# Patient Record
Sex: Female | Born: 1950 | Race: White | Hispanic: No | Marital: Married | Smoking: Never smoker
Health system: Southern US, Community
[De-identification: ages and names within clinical notes are randomized; demographics above are authoritative.]

## PROBLEM LIST (undated history)

## (undated) DIAGNOSIS — R112 Nausea with vomiting, unspecified: Secondary | ICD-10-CM

## (undated) DIAGNOSIS — M069 Rheumatoid arthritis, unspecified: Secondary | ICD-10-CM

## (undated) DIAGNOSIS — I779 Disorder of arteries and arterioles, unspecified: Secondary | ICD-10-CM

## (undated) DIAGNOSIS — I2510 Atherosclerotic heart disease of native coronary artery without angina pectoris: Secondary | ICD-10-CM

## (undated) DIAGNOSIS — F439 Reaction to severe stress, unspecified: Secondary | ICD-10-CM

## (undated) DIAGNOSIS — Z9889 Other specified postprocedural states: Secondary | ICD-10-CM

## (undated) DIAGNOSIS — E663 Overweight: Secondary | ICD-10-CM

## (undated) DIAGNOSIS — M75101 Unspecified rotator cuff tear or rupture of right shoulder, not specified as traumatic: Secondary | ICD-10-CM

## (undated) DIAGNOSIS — I739 Peripheral vascular disease, unspecified: Secondary | ICD-10-CM

## (undated) DIAGNOSIS — E785 Hyperlipidemia, unspecified: Secondary | ICD-10-CM

## (undated) DIAGNOSIS — I5032 Chronic diastolic (congestive) heart failure: Secondary | ICD-10-CM

## (undated) DIAGNOSIS — R609 Edema, unspecified: Secondary | ICD-10-CM

## (undated) DIAGNOSIS — I10 Essential (primary) hypertension: Secondary | ICD-10-CM

## (undated) DIAGNOSIS — I639 Cerebral infarction, unspecified: Secondary | ICD-10-CM

## (undated) DIAGNOSIS — B3781 Candidal esophagitis: Secondary | ICD-10-CM

## (undated) DIAGNOSIS — G709 Myoneural disorder, unspecified: Secondary | ICD-10-CM

## (undated) DIAGNOSIS — M79606 Pain in leg, unspecified: Secondary | ICD-10-CM

## (undated) HISTORY — DX: Reaction to severe stress, unspecified: F43.9

## (undated) HISTORY — PX: TUBAL LIGATION: SHX77

## (undated) HISTORY — DX: Hyperlipidemia, unspecified: E78.5

## (undated) HISTORY — DX: Essential (primary) hypertension: I10

## (undated) HISTORY — DX: Disorder of arteries and arterioles, unspecified: I77.9

## (undated) HISTORY — DX: Candidal esophagitis: B37.81

## (undated) HISTORY — DX: Edema, unspecified: R60.9

## (undated) HISTORY — DX: Atherosclerotic heart disease of native coronary artery without angina pectoris: I25.10

## (undated) HISTORY — DX: Overweight: E66.3

## (undated) HISTORY — DX: Pain in leg, unspecified: M79.606

## (undated) HISTORY — PX: CHOLECYSTECTOMY: SHX55

## (undated) HISTORY — PX: CORONARY ANGIOPLASTY: SHX604

## (undated) HISTORY — DX: Cerebral infarction, unspecified: I63.9

## (undated) HISTORY — DX: Chronic diastolic (congestive) heart failure: I50.32

## (undated) HISTORY — DX: Rheumatoid arthritis, unspecified: M06.9

## (undated) HISTORY — DX: Peripheral vascular disease, unspecified: I73.9

## (undated) HISTORY — PX: CARDIAC CATHETERIZATION: SHX172

## (undated) HISTORY — PX: OTHER SURGICAL HISTORY: SHX169

---

## 2000-04-04 ENCOUNTER — Inpatient Hospital Stay (HOSPITAL_COMMUNITY): Admission: EM | Admit: 2000-04-04 | Discharge: 2000-04-08 | Payer: Self-pay | Admitting: Cardiology

## 2000-12-20 ENCOUNTER — Ambulatory Visit (HOSPITAL_COMMUNITY): Admission: RE | Admit: 2000-12-20 | Discharge: 2000-12-21 | Payer: Self-pay | Admitting: Cardiology

## 2003-01-13 ENCOUNTER — Emergency Department (HOSPITAL_COMMUNITY): Admission: EM | Admit: 2003-01-13 | Discharge: 2003-01-13 | Payer: Self-pay | Admitting: Internal Medicine

## 2003-01-13 ENCOUNTER — Encounter: Payer: Self-pay | Admitting: Internal Medicine

## 2003-04-03 ENCOUNTER — Encounter: Payer: Self-pay | Admitting: Family Medicine

## 2003-04-03 ENCOUNTER — Ambulatory Visit (HOSPITAL_COMMUNITY): Admission: RE | Admit: 2003-04-03 | Discharge: 2003-04-03 | Payer: Self-pay | Admitting: Family Medicine

## 2003-11-29 ENCOUNTER — Ambulatory Visit (HOSPITAL_COMMUNITY): Admission: RE | Admit: 2003-11-29 | Discharge: 2003-11-29 | Payer: Self-pay | Admitting: General Surgery

## 2004-04-29 ENCOUNTER — Ambulatory Visit (HOSPITAL_COMMUNITY): Admission: RE | Admit: 2004-04-29 | Discharge: 2004-04-29 | Payer: Self-pay | Admitting: Family Medicine

## 2004-07-15 ENCOUNTER — Ambulatory Visit (HOSPITAL_COMMUNITY): Admission: RE | Admit: 2004-07-15 | Discharge: 2004-07-15 | Payer: Self-pay | Admitting: Podiatry

## 2004-10-19 ENCOUNTER — Ambulatory Visit: Payer: Self-pay | Admitting: Gastroenterology

## 2004-10-28 ENCOUNTER — Ambulatory Visit: Payer: Self-pay | Admitting: Gastroenterology

## 2004-10-30 ENCOUNTER — Ambulatory Visit: Payer: Self-pay | Admitting: Gastroenterology

## 2004-12-22 ENCOUNTER — Ambulatory Visit: Payer: Self-pay | Admitting: Cardiology

## 2005-01-13 ENCOUNTER — Ambulatory Visit: Payer: Self-pay

## 2005-01-20 ENCOUNTER — Ambulatory Visit: Payer: Self-pay | Admitting: Cardiology

## 2005-08-17 ENCOUNTER — Ambulatory Visit: Payer: Self-pay | Admitting: Gastroenterology

## 2005-10-18 ENCOUNTER — Ambulatory Visit (HOSPITAL_COMMUNITY): Admission: RE | Admit: 2005-10-18 | Discharge: 2005-10-18 | Payer: Self-pay | Admitting: *Deleted

## 2005-10-20 ENCOUNTER — Encounter: Admission: RE | Admit: 2005-10-20 | Discharge: 2005-12-10 | Payer: Self-pay | Admitting: *Deleted

## 2005-11-03 ENCOUNTER — Ambulatory Visit (HOSPITAL_COMMUNITY): Admission: RE | Admit: 2005-11-03 | Discharge: 2005-11-03 | Payer: Self-pay | Admitting: *Deleted

## 2006-07-19 ENCOUNTER — Ambulatory Visit: Payer: Self-pay

## 2006-07-19 ENCOUNTER — Ambulatory Visit: Payer: Self-pay | Admitting: Cardiology

## 2006-11-08 ENCOUNTER — Encounter: Admission: RE | Admit: 2006-11-08 | Discharge: 2007-02-06 | Payer: Self-pay | Admitting: *Deleted

## 2006-12-28 ENCOUNTER — Emergency Department (HOSPITAL_COMMUNITY): Admission: EM | Admit: 2006-12-28 | Discharge: 2006-12-28 | Payer: Self-pay | Admitting: Emergency Medicine

## 2007-02-28 ENCOUNTER — Ambulatory Visit (HOSPITAL_COMMUNITY): Admission: RE | Admit: 2007-02-28 | Discharge: 2007-03-01 | Payer: Self-pay | Admitting: *Deleted

## 2007-03-15 ENCOUNTER — Encounter: Admission: RE | Admit: 2007-03-15 | Discharge: 2007-06-13 | Payer: Self-pay | Admitting: *Deleted

## 2007-07-23 ENCOUNTER — Emergency Department (HOSPITAL_COMMUNITY): Admission: EM | Admit: 2007-07-23 | Discharge: 2007-07-23 | Payer: Self-pay | Admitting: Emergency Medicine

## 2007-08-15 ENCOUNTER — Encounter: Admission: RE | Admit: 2007-08-15 | Discharge: 2007-08-15 | Payer: Self-pay | Admitting: *Deleted

## 2008-01-10 ENCOUNTER — Ambulatory Visit: Payer: Self-pay

## 2008-01-11 ENCOUNTER — Ambulatory Visit: Payer: Self-pay | Admitting: Cardiology

## 2008-01-11 LAB — CONVERTED CEMR LAB
ALT: 18 units/L (ref 0–35)
AST: 13 units/L (ref 0–37)
Albumin: 3.3 g/dL — ABNORMAL LOW (ref 3.5–5.2)
Alkaline Phosphatase: 90 units/L (ref 39–117)
Bilirubin, Direct: 0.1 mg/dL (ref 0.0–0.3)
Cholesterol: 183 mg/dL (ref 0–200)
HDL: 43.9 mg/dL (ref >39.0)
LDL Cholesterol: 108 mg/dL — ABNORMAL HIGH (ref 0–99)
Total Bilirubin: 0.7 mg/dL (ref 0.3–1.2)
Total CHOL/HDL Ratio: 4.2
Total Protein: 6.8 g/dL (ref 6.0–8.3)
Triglycerides: 155 mg/dL — ABNORMAL HIGH (ref 0–149)
VLDL: 31 mg/dL (ref 0–40)

## 2008-11-04 DIAGNOSIS — E663 Overweight: Secondary | ICD-10-CM

## 2008-11-04 DIAGNOSIS — R609 Edema, unspecified: Secondary | ICD-10-CM | POA: Insufficient documentation

## 2008-11-11 ENCOUNTER — Telehealth: Payer: Self-pay | Admitting: Cardiology

## 2009-05-14 ENCOUNTER — Encounter: Admission: RE | Admit: 2009-05-14 | Discharge: 2009-05-14 | Payer: Self-pay | Admitting: Family Medicine

## 2009-08-27 ENCOUNTER — Telehealth: Payer: Self-pay | Admitting: Cardiology

## 2009-10-14 ENCOUNTER — Ambulatory Visit (HOSPITAL_COMMUNITY): Admission: RE | Admit: 2009-10-14 | Discharge: 2009-10-14 | Payer: Self-pay | Admitting: Family Medicine

## 2009-10-21 ENCOUNTER — Encounter (INDEPENDENT_AMBULATORY_CARE_PROVIDER_SITE_OTHER): Payer: Self-pay | Admitting: *Deleted

## 2009-11-04 ENCOUNTER — Telehealth: Payer: Self-pay | Admitting: Cardiology

## 2009-11-17 ENCOUNTER — Telehealth (INDEPENDENT_AMBULATORY_CARE_PROVIDER_SITE_OTHER): Payer: Self-pay | Admitting: *Deleted

## 2009-11-18 ENCOUNTER — Ambulatory Visit: Payer: Self-pay

## 2009-11-18 ENCOUNTER — Ambulatory Visit: Payer: Self-pay | Admitting: Cardiology

## 2009-11-18 ENCOUNTER — Encounter (HOSPITAL_COMMUNITY): Admission: RE | Admit: 2009-11-18 | Discharge: 2010-01-09 | Payer: Self-pay | Admitting: Cardiology

## 2009-11-28 LAB — CONVERTED CEMR LAB
ALT: 26 units/L (ref 0–35)
AST: 15 units/L (ref 0–37)
Albumin: 3.6 g/dL (ref 3.5–5.2)
Alkaline Phosphatase: 99 units/L (ref 39–117)
Bilirubin, Direct: 0.2 mg/dL (ref 0.0–0.3)
Cholesterol: 166 mg/dL (ref 0–200)
HDL: 49 mg/dL (ref >39.00)
LDL Cholesterol: 85 mg/dL (ref 0–99)
Total Bilirubin: 0.5 mg/dL (ref 0.3–1.2)
Total CHOL/HDL Ratio: 3
Total Protein: 6.7 g/dL (ref 6.0–8.3)
Triglycerides: 158 mg/dL — ABNORMAL HIGH (ref 0.0–149.0)
VLDL: 31.6 mg/dL (ref 0.0–40.0)

## 2009-12-03 ENCOUNTER — Encounter: Payer: Self-pay | Admitting: Cardiology

## 2009-12-04 ENCOUNTER — Ambulatory Visit: Payer: Self-pay | Admitting: Cardiology

## 2010-03-03 ENCOUNTER — Ambulatory Visit: Payer: Self-pay | Admitting: Cardiology

## 2010-03-17 ENCOUNTER — Encounter: Payer: Self-pay | Admitting: Cardiology

## 2010-03-17 ENCOUNTER — Ambulatory Visit: Payer: Self-pay

## 2010-03-19 ENCOUNTER — Telehealth: Payer: Self-pay | Admitting: Cardiology

## 2010-08-03 ENCOUNTER — Encounter: Payer: Self-pay | Admitting: Family Medicine

## 2010-08-11 NOTE — Assessment & Plan Note (Signed)
Summary: 3 month rov/sl   Visit Type:  Follow-up  CC:  CAD.  History of Present Illness: [place] is seen for followup of coronary artery disease.  I saw her last Dec 04, 2009.  She has known coronary disease.  She had a nuclear stress study Nov 18, 2009.  She did have EKG changes.  Ejection fraction was normal.  There was no definite ischemia.  She continued to have some mild chest discomfort and plans were made for her to be seen back today.  Today she mentions that she does have rare heaviness in her chest.  She does admit that she has significant emotional stresses.  She also mentions that at sometime in the past she thinks she may have had a mini stroke.  She describes change in her taste and smell.  There may have been some difficulty with movement on the right side.  This resolved.  Current Medications (verified): 1)  Furosemide 40 Mg Tabs (Furosemide) .... Take 1 Tablet By Mouth Once A Day 2)  Lipitor 40 Mg Tabs (Atorvastatin Calcium) .... Take 1 Tablet By Mouth Every Night 3)  Ramipril 2.5 Mg Caps (Ramipril) .... Take One Capsule By Mouth Daily 4)  Nitrostat 0.4 Mg Subl (Nitroglycerin) .... Place 1 Tablet Under Tongue As Directed 5)  Plavix 75 Mg Tabs (Clopidogrel Bisulfate) .... Take 1 Tablet By Mouth Once A Day 6)  Aspir-Low 81 Mg Tbec (Aspirin) .... Take One Daily 7)  Fish Oil 1000 Mg Caps (Omega-3 Fatty Acids) .... Take One Daily  Allergies (verified): 1)  Codeine Phosphate (Codeine Phosphate) 2)  Amoxicillin (Amoxicillin)  Past History:  Past Medical History: CAD  PTCA LAD..2001  /   cath  2002..prox and mid LAD stents patent...PTCA ostium Dx 1 / nuclear..01/2008...resting ST's become worse...no scar or ischemia /  nuclear.. Nov 18, 2009... ST changes with walking.. no chest pain... 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 Question carotid bruit   02/2010  Emotional stress    02/2010    Review of Systems       Patient denies fever, chills, headache, sweats, rash, change in vision, change in hearing, cough, nausea vomiting, urinary symptoms.  All other systems are reviewed and are negative other than the history of present illness.  Vital Signs:  Patient profile:   60 year old female Height:      63 inches Weight:      228 pounds BMI:     40.53 Pulse rate:   75 / minute BP sitting:   120 / 76  (left arm) Cuff size:   large  Vitals Entered By: Hardin Negus, RMA (March 03, 2010 8:54 AM)  Physical Exam  General:  Patient is overweight but stable. Head:  head is atraumatic. Eyes:  no xanthelasma. Neck:  no jugular venous distention.  Question of soft left carotid bruit. Chest Wall:  no chest wall tenderness. Lungs:  lungs are clear.  Respiratory effort is nonlabored. Heart:  cardiac exam reveals S1-S2.  No clicks or significant murmurs. Abdomen:  abdomen is soft. Msk:  no musculoskeletal deformities. Extremities:  no peripheral edema. Skin:  no skin rashes. Psych:  patient is oriented to person time and place.  Affect is normal.   Impression & Recommendations:  Problem # 1:  * EMOTIONAL STRESS The patient has been under significant emotional stress.  I do believe  this is playing a role in how she feels overall.  Follow up with a primary physician would be helpful.  Problem # 2:  * CAROTID BRUIT Patient gives a history of a possible mini stroke in the past.  She was not actually evaluated for this.  There are no significant residual symptoms.  With this history and a very soft left carotid bruit and known vascular disease, I have arranged for a carotid Doppler to be done.  I will be in touch with her with the information.  Problem # 3:  * MINI STROKE IN THE PAST  ?? We have no documentation of this.  This is the first time the patient has mentioned this to me.  She is on appropriate  medications.  Carotid Dopplers to be done.  Problem # 4:  CAD, UNSPECIFIED SITE (ICD-414.00)  Her updated medication list for this problem includes:    Ramipril 2.5 Mg Caps (Ramipril) .Marland Kitchen... Take one capsule by mouth daily    Nitrostat 0.4 Mg Subl (Nitroglycerin) .Marland Kitchen... Place 1 tablet under tongue as directed    Plavix 75 Mg Tabs (Clopidogrel bisulfate) .Marland Kitchen... Take 1 tablet by mouth once a day    Aspir-low 81 Mg Tbec (Aspirin) .Marland Kitchen... Take one daily The patient's coronary disease seems to be stable.  She does have some chest pressure.  She underwent a nuclear exercise test May, 2011 and was low risk.  I've chosen today not to change her meds.  I considered whether cardiac catheterization should be done and I feel that it is not indicated at this time.  I will see her for followup.  Problem # 5:  HYPERTENSION, UNSPECIFIED (ICD-401.9)  Her updated medication list for this problem includes:    Furosemide 40 Mg Tabs (Furosemide) .Marland Kitchen... Take 1 tablet by mouth once a day    Ramipril 2.5 Mg Caps (Ramipril) .Marland Kitchen... Take one capsule by mouth daily    Aspir-low 81 Mg Tbec (Aspirin) .Marland Kitchen... Take one daily Blood pressure is under good control today.  No change in therapy.  Problem # 6:  OVERWEIGHT/OBESITY (ICD-278.02) Unfortunately the patient is getting a great deal of her weight back.  Other Orders: Carotid Duplex (Carotid Duplex)  Patient Instructions: 1)  Your physician has requested that you have a carotid duplex. This test is an ultrasound of the carotid arteries in your neck. It looks at blood flow through these arteries that supply the brain with blood. Allow one hour for this exam. There are no restrictions or special instructions. 2)  Your physician wants you to follow-up in:  6 months.  You will receive a reminder letter in the mail two months in advance. If you don't receive a letter, please call our office to schedule the follow-up appointment.

## 2010-08-11 NOTE — Assessment & Plan Note (Signed)
Summary: need f/u after myoview/sl   Visit Type:  two-year followup  CC:  CAD.  History of Present Illness: The patient is seen for followup of coronary artery disease.  I saw her last in July, 2009.  She reported to [place] that she had some mild symptoms and we arranged for a nuclear exercise study done Nov 18, 2009.  The study revealed that she walked for 6 minutes.  There was no chest pain.  There were some ST changes.  There was some anterior breast attenuation but no ischemia.  Ejection fraction was 79%.  This was a low risk scan.  She states that she feels that her overall exercise tolerance is somewhat decreased from the past.  Her original symptoms of ischemia were unusual.  She may have had a choking sensation and also some discomfort in her right arm.  She has not had this type of symptom.  Current Medications (verified): 1)  Furosemide 40 Mg Tabs (Furosemide) .... Take 1 Tablet By Mouth Once A Day 2)  Lipitor 40 Mg Tabs (Atorvastatin Calcium) .... Take 1 Tablet By Mouth Every Night 3)  Metoprolol Tartrate 50 Mg Tabs (Metoprolol Tartrate) .... Take 1 Tablet By Mouth Once A Day 4)  Nitrostat 0.4 Mg Subl (Nitroglycerin) .... Place 1 Tablet Under Tongue As Directed 5)  Plavix 75 Mg Tabs (Clopidogrel Bisulfate) .... Take 1 Tablet By Mouth Once A Day 6)  Aspir-Low 81 Mg Tbec (Aspirin) .... Take One Daily 7)  Fish Oil 1000 Mg Caps (Omega-3 Fatty Acids) .... Take One Daily  Allergies: 1)  Codeine Phosphate (Codeine Phosphate) 2)  Amoxicillin (Amoxicillin)  Past History:  Past Medical History: CAD  PTCA LAD..2001  /   cath  2002..prox and mid LAD stents patent...PTCA ostium Dx 1 / nuclear..01/2008...resting ST's become worse...no scar or ischemia /  nuclear.. Nov 18, 2009... ST changes with walking.. no chest pain... anterior breast attenuation with no ischemia HYPERTENSION OVERWEIGHT   laparoscopic adjustable gastric banding with APS standard system EDEMA HYPERLIPIDEMIA    Review of  Systems       Patient denies fever, chills, headache, sweats, rash, change in vision, change in hearing, chest pain, cough, nausea vomiting, urinary symptoms.  All other systems are reviewed and are negative.  Vital Signs:  Patient profile:   60 year old female Height:      63 inches Weight:      226 pounds Pulse rate:   72 / minute Pulse rhythm:   regular BP sitting:   126 / 74  (left arm)  Vitals Entered By: Jacquelin Hawking, CMA (Dec 04, 2009 8:38 AM)  Physical Exam  General:  patient is overweight but stable. Head:  head is atraumatic. Eyes:  no xanthelasma. Neck:  no jugular venous stent in.  No carotid bruits. Chest Wall:  no chest wall tenderness. Lungs:  lungs are clear respiratory effort is not labored. Heart:  cardiac exam reveals S1-S2.  No clicks or significant murmurs. Abdomen:  abdomen is soft.  No masses or bruits. Msk:  no musculoskeletal deformities. Extremities:  no peripheral edema. Skin:  no skin rashes. Psych:  patient is oriented to person time and place.  Affect is normal.   Impression & Recommendations:  Problem # 1:  CAD, UNSPECIFIED SITE (ICD-414.00) The patient had a stress nuclear study Nov 18, 2009.  She exercised for 6-1/2 minutes with no chest pain.  She had anterior breast attenuation but no scar or ischemia.  She appears to be stable  at this point.  The patient mentioned that she has a family member who had a normal exercise test and then had a large MI several weeks later.  I explained to her at length the concept of having a ruptured plaque.  She and I talked about what information her exercise test does and does not tell us.  She appears to understand.  She mentions that she has some fatigue in general.  She wonders if this this is not progression of her coronary disease.  At this point I am not convinced that it is.  Her original symptoms were unusual but not the same as she has felt recently.  I will plan to see her back in 3 months to reassess  her status.  If she has any unexplained worsening symptoms more aggressive evaluation be done.  I have reviewed completely reported for nuclear exercise test.  I also reviewed her records from 2009.  Problem # 2:  HYPERTENSION, UNSPECIFIED (ICD-401.9)  Her updated medication list for this problem includes:    Furosemide 40 Mg Tabs (Furosemide) .Marland Kitchen... Take 1 tablet by mouth once a day    Metoprolol Tartrate 50 Mg Tabs (Metoprolol tartrate) .Marland Kitchen... Take 1 tablet by mouth once a day    Aspir-low 81 Mg Tbec (Aspirin) .Marland Kitchen... Take one daily Blood pressure is controlled today.  No change in therapy.  Problem # 3:  EDEMA (ICD-782.3) She is not having any difficulties with recurrent edema.  No change in therapy.  Problem # 4:  HYPERLIPIDEMIA-MIXED (ICD-272.4)  Her updated medication list for this problem includes:    Lipitor 40 Mg Tabs (Atorvastatin calcium) .Marland Kitchen... Take 1 tablet by mouth every night The patient's recent labs are good.  Her LDL is 84 and HDL 49.  There is mild triglyceride elevation.  I discussed better diet with her.  No change in therapy at this time.  Patient Instructions: 1)  Follow up in 3 months Prescriptions: PLAVIX 75 MG TABS (CLOPIDOGREL BISULFATE) Take 1 tablet by mouth once a day  #90 x 3   Entered by:   Meredith Staggers, RN   Authorized by:   Talitha Givens, MD, [facility]   Signed by:   Meredith Staggers, RN on 12/04/2009   Method used:   Electronically to        [facility] (retail)       [address]       [place], [place]  [address]       Ph: [phone]       Fax: [phone]   RxID:   [id number] NITROSTAT 0.4 MG SUBL (NITROGLYCERIN) Place 1 tablet under tongue as directed  #25 x 6   Entered by:   Meredith Staggers, RN   Authorized by:   Talitha Givens, MD, [facility]   Signed by:   Meredith Staggers, RN on 12/04/2009   Method used:   Electronically to        [facility] [address] (retail)       [address]       [place], [place]  [address]       Ph: [phone]       Fax: [phone]   RxID:   [id number] METOPROLOL TARTRATE 50 MG TABS (METOPROLOL TARTRATE) Take 1 tablet by mouth once a day  #90 Tablet x 3   Entered by:   Meredith Staggers,  RN   Authorized by:   Talitha Givens, MD, [facility]   Signed by:   Meredith Staggers, RN on 12/04/2009   Method used:   Electronically to        [facility] [address] (retail)       [address]       [place], [place]  [address]       Ph: [phone]       Fax: [phone]   RxID:   [id number] LIPITOR 40 MG TABS (ATORVASTATIN CALCIUM) Take 1 tablet by mouth every night  #90 x 3   Entered by:   Meredith Staggers, RN   Authorized by:   Talitha Givens, MD, [facility]   Signed by:   Meredith Staggers, RN on 12/04/2009   Method used:   Electronically to        [facility] (retail)       [address]       [place], [place]  [address]       Ph: [phone]       Fax: [phone]   RxID:   [id number] FUROSEMIDE 40 MG TABS (FUROSEMIDE) Take 1 tablet by mouth once a day  #90 x 3   Entered by:   Meredith Staggers, RN   Authorized by:   Talitha Givens, MD, [facility]   Signed by:   Meredith Staggers, RN on 12/04/2009   Method used:   Electronically to        [facility] [address] (retail)       [address]       [place], [place]  [address]       Ph: [phone]       Fax: [phone]   RxID:   [id number]

## 2010-08-11 NOTE — Progress Notes (Signed)
Summary: Test results  Phone Note Call from Patient Call back at Home Phone [phone] Call back at [phone]   Caller: Patient Reason for Call: Lab or Test Results Initial call taken by: Judie Grieve,  March 19, 2010 3:47 PM  Follow-up for Phone Call        carotids on 9/6 not in system yet, called pt and left mess not available now will call her as soon get results Meredith Staggers, RN  March 19, 2010 4:34 PM   pt given results Meredith Staggers, RN  March 23, 2010 2:25 PM

## 2010-08-11 NOTE — Cardiovascular Report (Signed)
Summary: Outpatient Coinsurance Notice   Outpatient Coinsurance Notice   Imported By: Roderic Ovens 11/24/2009 12:23:44  _____________________________________________________________________  External Attachment:    Type:   Image     Comment:   External Document

## 2010-08-11 NOTE — Letter (Signed)
Summary: Colonoscopy Letter  [facility]  [address] [place], [place] [address]   Phone: [phone]  Fax: [phone]      October 21, 2009 MRN: [id number]   [facility] [address] [place], [place]  [address]   Dear Ms. SHEK,   According to your medical record, it is time for you to schedule a Colonoscopy. The American Cancer Society recommends this procedure as a method to detect early colon cancer. Patients with a family history of colon cancer, or a personal history of colon polyps or inflammatory bowel disease are at increased risk.  This letter has beeen generated based on the recommendations made at the time of your procedure. If you feel that in your particular situation this may no longer apply, please contact our office.  Please call our office at [phone] to schedule this appointment or to update your records at your earliest convenience.  Thank you for cooperating with [place] to provide you with the very best care possible.   Sincerely,  Judie Petit T. Russella Dar, M.D.  [facility] [phone]

## 2010-08-11 NOTE — Miscellaneous (Signed)
  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

## 2010-08-11 NOTE — Progress Notes (Signed)
Summary: Nuclear Pre-Procedure  Phone Note Outgoing Call   Call placed by: Milana Na, EMT-P,  Nov 17, 2009 4:03 PM Summary of Call: Left message with information on Myoview Information Sheet (see scanned document for details).      Nuclear Med Background Indications for Stress Test: Evaluation for Ischemia   History: Angioplasty, Heart Catheterization, Myocardial Perfusion Study, Stents  History Comments: '01 Stents x2 LAD '02 Heart Cath Patent stents -- OM 95% '02 Angioplasty OM 07/09 MPS (-) ischemia or scar EF 80%  Symptoms: Chest Pressure, Chest Pressure with Exertion    Nuclear Pre-Procedure Cardiac Risk Factors: Family History - CAD, Hypertension, Lipids  Nuclear Med Study Referring MD:  [employer]

## 2010-08-11 NOTE — Assessment & Plan Note (Signed)
Summary: Cardiology Nuclear Study  Nuclear Med Background Indications for Stress Test: Evaluation for Ischemia, Stent Patency, PTCA Patency   History: Angioplasty, Heart Catheterization, Myocardial Perfusion Study, Stents  History Comments: '01 HSRA/Stent-LAD x 2; '02 Cath:patent stent,OM-95%>PTCA-OM; 07/09 MPS:no ischemia or scar, EF=80%  Symptoms: Chest Pressure, Chest Pressure with Exertion, Dizziness, DOE, Fatigue, Nausea, Near Syncope, Palpitations  Symptoms Comments: Last episode of VP:XTGG week.   Nuclear Pre-Procedure Cardiac Risk Factors: Family History - CAD, Hypertension, Lipids, Obesity Caffeine/Decaff Intake: None NPO After: 12:00 AM Lungs: Clear IV 0.9% NS with Angio Cath: 22g     IV Site: (L) AC IV Started by: Irean Hong RN Chest Size (in) 40     Cup Size D     Height (in): 63 Weight (lb): 225 BMI: 40.00 Tech Comments: Metoprolol held x 36 hours  Nuclear Med Study 1 or 2 day study:  1 day     Stress Test Type:  Stress Reading MD:  Olga Millers, MD     Referring MD:  Willa Rough, MD Resting Radionuclide:  Technetium 68m Tetrofosmin     Resting Radionuclide Dose:  10.5 mCi  Stress Radionuclide:  Technetium 28m Tetrofosmin     Stress Radionuclide Dose:  33.0 mCi   Stress Protocol Exercise Time (min):  6:30 min     Max HR:  138 bpm     Predicted Max HR:  162 bpm  Max Systolic BP: 177 mm Hg     Percent Max HR:  85.19 %     METS: 7.4 Rate Pressure Product:  26948    Stress Test Technologist:  Rea [employer] CMA-N     Nuclear Technologist:  Domenic Polite CNMT  Rest Procedure  Myocardial perfusion imaging was performed at rest 45 minutes following the intravenous administration of Myoview Technetium 85m Tetrofosmin.  Stress Procedure  The patient exercised for 6:30.  The patient stopped due to fatigue and denied any chest pain.  There were ST-T wave changes and an isolated PAC.  Myoview was injected at peak exercise and myocardial perfusion imaging was  performed after a brief delay.  QPS Raw Data Images:  Acuisition technically good; normal left ventricular size. Stress Images:  There is mild decreased uptake in the anterior wall. Rest Images:  There is mild decreased uptake in the anterior wall. Subtraction (SDS):  No evidence of ischemia. Transient Ischemic Dilatation:  .97  (Normal <1.22)  Lung/Heart Ratio:  .32  (Normal <0.45)  Quantitative Gated Spect Images QGS EDV:  84 ml QGS ESV:  18 ml QGS EF:  79 % QGS cine images:  Normal wall motion.   Overall Impression  Exercise Capacity: Fair exercise capacity. BP Response: Normal blood pressure response. Clinical Symptoms: No chest pain ECG Impression: Significant ST abnormalities consistent with ischemia. Overall Impression: Low risk stress nuclear study with soft tissue attenuation but no ischemia.  Appended Document: Cardiology Nuclear Study Needs an appointment some time.  No rush.  Appended Document: Cardiology Nuclear Study pt aware she has f/u appt on 5/26

## 2010-08-11 NOTE — Progress Notes (Signed)
Summary: pt wants to be set up for stress test  Phone Note Call from Patient Call back at Home Phone [phone] Call back at [phone]    Caller: Patient Reason for Call: Talk to Nurse Summary of Call: pt would like to be set up for stress test.  Initial call taken by: Lorne Skeens,  November 04, 2009 8:26 AM  Follow-up for Phone Call        will ask Dr Myrtis Ser what kind of stress test she needs.  Has been two year since last stress test.  Also needs fasting blood work( lipid).  Having discomfort on occasion, pressure not pain, sits down and rests and it fades away. No s/s at this time.  Pt encouraged to call back if s/s reoccur. Follow-up by: Charolotte Capuchin, RN,  November 04, 2009 8:34 AM  Additional Follow-up for Phone Call Additional follow up Details #1::        Arrange for stress myoview. Use the same type of stress done with her last study. Use CAD as the diagnosis. Then f/u visit with me.  placed order for stress myoview and f/u appt, left mess for pt we will contact her to sch Meredith Staggers, RN  November 07, 2009 4:01 PM

## 2010-08-11 NOTE — Progress Notes (Signed)
Summary: needs refill asap --- lipitor  Phone Note Refill Request Message from:  Patient on [facility]  Refills Requested: Medication #1:  LIPITOR 40 MG TABS Take 1 tablet by mouth every night Initial call taken by: Omer Jack,  August 27, 2009 1:04 PM  Follow-up for Phone Call        no answer --- pt needs ROV for more refills Follow-up by: Hardin Negus, RMA,  August 27, 2009 2:45 PM    Prescriptions: LIPITOR 40 MG TABS (ATORVASTATIN CALCIUM) Take 1 tablet by mouth every night  #30 x 1   Entered by:   Hardin Negus, RMA   Authorized by:   Talitha Givens, MD, [facility]   Signed by:   Hardin Negus, RMA on 08/27/2009   Method used:   Electronically to        [facility] [phone]* (retail)       [address]       [place], [place]  [address]       Ph: [phone] or [phone]       Fax: [phone]   RxID:   [id number]

## 2010-10-02 ENCOUNTER — Other Ambulatory Visit: Payer: Self-pay | Admitting: Cardiology

## 2010-10-08 NOTE — Telephone Encounter (Signed)
[address] °

## 2010-10-09 ENCOUNTER — Other Ambulatory Visit: Payer: Self-pay | Admitting: *Deleted

## 2010-10-09 MED ORDER — RAMIPRIL 2.5 MG PO CAPS: 2.5000 mg | ORAL_CAPSULE | Freq: Every day | ORAL | Status: DC

## 2010-11-24 NOTE — Assessment & Plan Note (Signed)
[facility]CARE                            CARDIOLOGY OFFICE NOTE   NAME:Cindy Holmes, Cindy Holmes                      MRN:          [id number]  DATE:01/11/2008                            DOB:          [birth year]    Ms. Dominik is here for followup.  She has known coronary disease.  She  has had some discomfort in her shoulder.  However, she is doing  relatively well.  She does not have any nausea or vomiting.  Her weight  is down to 226.  She had lap banding.  She says she is committed to  continuing to bring her weight down.   ALLERGIES:  CODEINE and PENICILLIN.   MEDICATIONS:  Lopressor, Plavix, furosemide, calcium, Altace, Lipitor  40, multivitamin, and Nexium.  We will recheck to be sure that she is on  aspirin as it is not listed in the list at the moment.   OTHER MEDICAL PROBLEMS:  See the list below.   REVIEW OF SYSTEMS:  Other than the HPI, her review of systems is  negative.   PHYSICAL EXAMINATION:  VITAL SIGNS:  Weight is 226.  Blood pressure  135/72 with a pulse of 75.  GENERAL:  The patient is oriented to person, time, and place.  Affect is  normal.  HEENT:  Reveals no xanthelasma.  She has normal extraocular motion.  NECK:  There are no carotid bruits.  There is no jugular venous  distention.  LUNGS:  Clear.  Respiratory effort is not labored.  CARDIAC:  Exam reveals S1 and S2.  There are no clicks or significant  murmurs.  ABDOMEN:  Soft.  She has no peripheral edema today.  She does mention  that she has some mild edema at times.  This is usually in the  summertime at the end of the day.   The patient had a stress Myoview scan on January 10, 2008.  We had already  arranged this because of her recurring problems and because of the  chest, shoulder discomfort.  There was no change from 2008.  She has  limited exercise tolerance.  She needs to increase her activity.  There  was no scar or ischemia.   PROBLEMS:  1. Coronary disease that is  stable.  2. Hyperlipidemia.  She will have a fasting lipid profile today.  3. Hypertension, treated.  4. Increased weight.  This is improving.  5. Mild fluid overload.  This is stable.   She will have a fasting lipid profile.  I will be in touch with her.  I  will then see her back in 1 year.     Luis Abed, MD, [facility]  Electronically Signed    JDK/MedQ  DD: 01/11/2008  DT: 01/11/2008  Job #: [id number]

## 2010-11-24 NOTE — Op Note (Signed)
NAMEDJENEBA, BARSCH               ACCOUNT NO.:  [id number]   MEDICAL RECORD NO.:  [id number]          PATIENT TYPE:  OIB   LOCATION:  1537                         FACILITY:  [facility]   PHYSICIAN:  Alfonse Ras, MD   DATE OF BIRTH:  [birth year]   DATE OF PROCEDURE:  02/28/2007  DATE OF DISCHARGE:                               OPERATIVE REPORT   PREOPERATIVE DIAGNOSIS:  Medically refractory morbid obesity.   POSTOPERATIVE DIAGNOSIS:  Medically refractory morbid obesity.   PROCEDURE:  Laparoscopic adjustable gastric banding with APS standard  system.   ANESTHESIA:  General.   SURGEON:  Alfonse Ras, M.D.   ASSISTANTThornton Park. Daphine Deutscher, M.D.   DESCRIPTION OF PROCEDURE:  The patient was taken to the operating room  and placed in a supine position. The abdomen was prepped and draped in a  normal sterile fashion.  Using a m11 mm OptiVu trocar in the left upper  quadrant, peritoneal access was obtained under direct vision.  An  additional 5 mm trocar was placed in the left abdomen, right abdomen,  and the adhesions to the anterior abdominal wall were taken down with  sharp dissection.  This allowed for the placement of a 15, an 11 mm  trocar in the right upper quadrant and an additional 11 mm trocar in the  left periumbilical space.  With pneumoperitoneum, it was easy to  visualize the stomach.  The angle of His was sharply and bluntly  dissected with the band passer.  Using a pars flaccida technique,  retrogastric tunnel was created.  A standard band of the APS system was  then passed through the 15 mm port.  This was passed by the band passer  behind the stomach.  It was closed down around the sizing tube without  difficulty.  Fundoplication was performed using interrupted 2-0 Ethibond  sutures in an anterior fashion and secured in place.  I chose not to put  an additional stitch in, as the anatomy looked accommodating.  The  [facility] liver retractor was removed without  difficulty.  All trocars  were removed.  The tubing was brought out through the 11 mm port in the  right abdomen and affixed to the port.  The port was then fixed to the  anterior fascia with interrupted 2-0 Prolene sutures.  Skin incisions  were closed with subcuticular 4-0 Monocryl.  Steri-Strips and sterile  dressings were applied.  The patient tolerated the procedure well and  went to the PACU in good condition.      Alfonse Ras, MD  Electronically Signed     KRE/MEDQ  D:  02/28/2007  T:  03/01/2007  Job:  [id number]

## 2010-11-26 ENCOUNTER — Telehealth: Payer: Self-pay | Admitting: Cardiology

## 2010-11-26 NOTE — Telephone Encounter (Signed)
Pt had myoview in May 2011, will send to Dr Myrtis Ser to see if pt needs myoview or not

## 2010-11-26 NOTE — Telephone Encounter (Signed)
Pt states she is suppose to have a nuclear study every year.  Please set this up and advise patient of time and date.

## 2010-11-27 NOTE — Discharge Summary (Signed)
[place]  Patient:    Cindy Holmes                        MRN: [id number] Adm. Date:  12/20/00 Disc. Date: 12/21/00 Attending:  Luis Abed, M.D. [facility] Dictator:   Rozell Searing, P.A. CC:         Kirk Ruths, M.D.   Referring Physician Discharge [place]  PROCEDURE:  Percutaneous transluminal coronary angioplasty, diagonal, December 20, 2000.  REASON FOR ADMISSION:  Cindy Holmes is a 60 year old female with known CAD, status post multiple prior percutaneous interventions and followup by Dr. Luis Abed, who underwent recent Cardiolite testing for evaluation of exertional dyspnea.  The scan revealed anterolateral ischemia and Dr. Myrtis Ser recommended a relook coronary angiogram.  Please refer to dictated office note for full details.  LABORATORY DATA (PREADMISSION):  Sodium 135, potassium 4.4, glucose 101, BUN 7, creatinine 0.8.  WBC 6.9, hemoglobin 13, hematocrit 39.8, platelets 363,000.  INR 0.9.  HOSPITAL COURSE:  Patient underwent elective coronary angiogram on December 20, 2000, performed by Dr. Loraine Leriche Pulsipher (see report for full details), revealing a 95% ostial diagonal lesion which was "jailed" by the proximal LAD stent.  The two LAD stents were patent with 30-40% in-stent restenosis at the proximal stent site and less than 10% restenosis at the distal stent site. Residual anatomy notable for 60% OM and 30% proximal RCA.  Left ventriculogram was normal (EF 74%) with no wall motion abnormality or MR.  Dr. Gerri Spore proceeded with successful PTCA dilatation of the 95% ostial diagonal lesion to less than 20% residual stenosis; no noted complications; Integrilin infused x 18 hours.  Dr. Gerri Spore recommended four-week course of Plavix, given that he had to pass the catheter through the proximal LAD stent.  Patient was cleared for discharge the following morning in hemodynamically stable condition.  Followup potassium 3.2 -- 40 mEq ordered  prior to discharge and home maintenance dose increased to 20 mEq q.d.  Patient will need a followup BMP at time of office visit.  DISCHARGE MEDICATIONS: 1. Plavix 75 mg q.d. (x 1 month). 2. Aspirin 325 mg q.d. 3. Lopressor 25 mg b.i.d. 4. Altace 5 mg q.d. 5. Lasix 40 mg q.d. 6. K-Dur 20 mEq q.d. 7. PROVEIT study drug as directed. 8. Nitrostat as directed.  DISCHARGE INSTRUCTIONS:  Refrain from heavy lifting/driving x 2 days; maintain low-fat/-cholesterol diet; call office if there is any bleeding/swelling at the groin.  FOLLOWUP:  Patient will follow up with Dr. [facility]on [facility] on Thursday, January 05, 2001 at 12 p.m.  She will need a followup BMP.  DISCHARGE DIAGNOSES: 1. Coronary artery disease progression.    a. Status post percutaneous transluminal coronary angioplasty, 95% ostial       diagonal, December 20, 2000.    b. Patent stents, left anterior descending (x 2) sites.    c. Normal left ventricle.    d. Status post high-speed rotational atherectomy/stent, proximal left       anterior descending; stent mid left anterior descending; high-speed       rotational atherectomy/percutaneous transluminal coronary angioplasty,       ostial diagonal #1 -- September 2001. 2. Hypokalemia. 3. Dyslipidemia -- PROVEIT Trial. 4. Hypertension. DD:  12/21/00 TD:  12/21/00 Job: [id number]

## 2010-11-27 NOTE — Op Note (Signed)
NAME:  Cindy Holmes, Cindy Holmes                         ACCOUNT NO.:  [id number]   MEDICAL RECORD NO.:  [id number]                   PATIENT TYPE:  AMB   LOCATION:  DAY                                  FACILITY:  [facility]   PHYSICIAN:  Dalia Heading, M.D.               DATE OF BIRTH:  02-26-1951   DATE OF PROCEDURE:  11/29/2003  DATE OF DISCHARGE:                                 OPERATIVE REPORT   PREOPERATIVE DIAGNOSIS:  Ganglion cyst, right wrist.   POSTOPERATIVE DIAGNOSIS:  Ganglion cyst, right wrist.   PROCEDURE:  Excision of ganglion cyst, right wrist.   SURGEON:  Dr. Franky Macho   ANESTHESIA:  Regional.   INDICATIONS:  The patient is a 60 year old white female, presents with  symptomatic ganglion cyst on the volar aspect of the right wrist.  She now  comes to the operating room for excision of the ganglion cyst.  The risks  and benefits of the procedure were fully explained to the patient, who gave  informed consent.   PROCEDURE NOTE:  The patient was placed in the supine position.  After a  regional block was applied to the right wrist, the right wrist and hand were  prepped and draped using the usual sterile technique with Betadine.  Surgical site confirmation was performed.   A longitudinal incision was made over the ganglion cyst.  The dissection was  extended down to the base.  A 5-0 Vicryl suture ligature was placed at the  base of the ganglion cyst.  The ganglion cyst was then removed without  difficulty.  Cautery was used judiciously.  The skin was reapproximated  using a 5-0 Vicryl subcuticular suture.  The tourniquet was let down.  Total  tourniquet time was 28 minutes.  No abnormal bleeding was noted.  Sensorcaine 0.25% was instilled into the surrounding wound.  Steri-Strips  and a dry sterile dressings were then applied.   All tape and needle counts were correct at the end of the procedure.  The  patient was transferred back to day surgery in stable condition.   COMPLICATIONS:  None.   SPECIMENS:  Ganglion cyst, right wrist.   BLOOD LOSS:  Minimal.     ___________________________________________                                            Dalia Heading, M.D.   MAJ/MEDQ  D:  11/29/2003  T:  11/29/2003  Job:  [id number]

## 2010-11-27 NOTE — Telephone Encounter (Signed)
Spoke w/pt she is aware, yearly f/u w/Dr Myrtis Ser sch for 7/6

## 2010-11-27 NOTE — H&P (Signed)
NAME:  Cindy Holmes NO.:  [id number]   MEDICAL RECORD NO.:  [id number]                  PATIENT TYPE:   LOCATION:                                       FACILITY:   PHYSICIAN:  Dalia Heading, M.D.               DATE OF BIRTH:  1951/05/06   DATE OF ADMISSION:  DATE OF DISCHARGE:                                HISTORY & PHYSICAL   CHIEF COMPLAINT:  Ganglion cyst, right wrist.   HISTORY OF PRESENT ILLNESS:  The patient is a 60 year old white female who  is referred for evaluation and treatment of a ganglion cyst of her right  wrist. It has been present for some time but recently has increased in size  and is causing discomfort. Some tingling in the palm of the right hand is  noted with extension. She is right hand dominant.   PAST MEDICAL HISTORY:  1. Coronary artery disease.  2. Hypertension.   PAST SURGICAL HISTORY:  1. Tubal ligation.  2. Cholecystectomy.  3. Heart catheterization with stent placement in 2001.   CURRENT MEDICATIONS:  1. Plavix and aspirin which she is holding.  2. Lopressor one tablet p.o. q.d.  3. Lasix one tablet p.o. q.d.  4. Lotrel.   ALLERGIES:  CODEINE.   REVIEW OF SYSTEMS:  The patient denies any recent chest pain, MI, CVA,  diabetes mellitus, or bleeding disorder. She denies drinking or smoking.   PHYSICAL EXAMINATION:  GENERAL:  The patient is a well-developed, well-  nourished, white female in no acute distress. She is afebrile and vital  signs are stable.  LUNGS:  Clear to auscultation with equal breath sounds bilaterally.  HEART:  Reveals regular rate and rhythm without S3, S4, or murmurs.  EXTREMITIES:  Reveals the right wrist with a lobulated cystic mass over the  volar aspect. It is medial to the radial artery. Both radial and ulnar  pulses are noted to be intact.   IMPRESSION:  Ganglion cyst, right wrist.   PLAN:  The patient is scheduled for excision of the ganglion cyst, right  wrist, on Nov 29, 2003. The risks and benefits of the procedure including  bleeding, infection, and possibly recurrence of the cyst were fully  explained to the patient who gave informed consent.     ___________________________________________                                         Dalia Heading, M.D.   MAJ/MEDQ  D:  11/21/2003  T:  11/21/2003  Job:  [id number]

## 2010-11-27 NOTE — Telephone Encounter (Signed)
Left message to call back  

## 2010-11-27 NOTE — Cardiovascular Report (Signed)
[place]. [facility]  Patient:    Cindy Holmes                        MRN: [id number] Proc. Date: 12/20/00 Adm. Date:  [id number] Attending:  Talitha Givens CC:         Maurice Small., M.D.  Luis Abed, M.D. [facility]  Cardiac Catheterization Lab   Cardiac Catheterization  PROCEDURE PERFORMED: 1. Left heart catheterization with coronary angiography and left    ventriculography. 2. Percutaneous transluminal coronary angioplasty of the ostium of the first    diagonal.  INDICATIONS:  Cindy Holmes is a 60 year old woman with a history of previous complex intervention to the left anterior descending artery and first diagonal branch in September of last year.  She underwent rotational atherectomy with stent placement x 2, one stent in the proximal LAD, one stent in the mid LAD. The stent in the proximal LAD was placed across the origin of the first diagonal branch.  The first diagonal origin was also treated with rotational atherectomy and balloon angioplasty.  The patient subsequently developed recurrent anginal symptoms.  She therefore underwent a stress Cardiolite which showed evidence of anterolateral ischemia.  She was thus referred for cardiac catheterization.  CATHETERIZATION PROCEDURAL NOTE:  A 6-French sheath was placed in the right femoral artery.  Standard Judkins 6-French catheters were utilized.  Contrast was Omnipaque.  There were no complications.  HEMODYNAMICS:  Left ventricular pressure 130/18.  Aortic pressure 130/72. There was no aortic valve gradient.  LEFT VENTRICULOGRAM:  Wall motion is normal.  Ejection fraction is calculated at 74%.  No mitral regurgitation.  CORONARY ARTERIOGRAPHY (right dominant): 1. The left main is normal. 2. The left anterior descending artery has a stent in the proximal vessel    across the origin of the first diagonal.  In the proximal portion of this    stent is a focal 30-40% area of  stenosis.  The distal portion of the stent    is widely patent.  There is a stent in the mid LAD which is widely patent    with less than 10% stenosis within the stent.  Just distal to the stent in    the distal LAD is a tubular 40% stenosis.  The LAD gives rise to a large    diagonal branch across which the stent in the proximal vessel is placed.    There is a a 95% stenosis in the ostium of this vessel. 3. The left circumflex gives rise to a normal sized OM-1 and normal sized    OM-2.  OM-1 has a 60% stenosis in the proximal body. 4. The right coronary artery is a dominant vessel.  There is a 30% stenosis in    the proximal vessel.  The distal right coronary artery gives rise to a    normal sized posterior descending artery, a small first and second    posterolateral branch, and a normal third posterolateral branch.  IMPRESSIONS: 1. Normal left ventricular systolic function. 2. One-vessel coronary artery disease characterized by a 95% area of    restenosis in the ostium of the first diagonal branch.  The stents in the    proximal and mid LAD are patent.  PLAN:  Percutaneous intervention to the first diagonal.  See below.  PTCA PROCEDURAL NOTE:  Following completion of the diagnostic catheterization, we proceeded with the coronary intervention.  The 6-French sheath in the right femoral artery  was exchanged over wire for a 7-French sheath.  We placed a 6-French sheath in the right femoral vein.  Heparin and Integrilin were administered per protocol.  We used a 7-French JL4 guiding catheter.  A Hi-Torque Floppy wire was passed through the side struts of the stent in the LAD into the distal portion of the diagonal branch.  PTCA of the ostium of the diagonal was performed with a 2.5- x 15-mm Maverick balloon inflated to six, then eight, and then eight atmospheres.  The last balloon inflation was for two minutes.  Final angiographic images revealed patency of the first diagonal with less  than 20% residual stenosis and TIMI-3 flow.  COMPLICATIONS:  None.  RESULTS:  Successful PTCA of the ostium of the first diagonal branch reducing a 95% stenosis to less than 20% residual with TIMI-3 flow.  PLAN:  Integrilin will be continued for 18 hours.  Plavix will be administered for four weeks. DD:  12/20/00 TD:  12/20/00 Job: [id number]

## 2010-11-27 NOTE — Telephone Encounter (Signed)
She does not need a myoview this year.

## 2010-11-27 NOTE — Assessment & Plan Note (Signed)
[facility]ALTHCARE                            CARDIOLOGY OFFICE NOTE   NAME:WICKERTakaya, Hyslop                        MRN:          [id number]  DATE:12/18/2007                            DOB:          [birth year]    Ms. Settle has known coronary disease.  It is appropriate for her to  have a stress Myoview scan to follow her coronary disease.  This will be  arranged and then I will see her in an office visit after this.     Luis Abed, MD, [facility]  Electronically Signed    JDK/MedQ  DD: 12/18/2007  DT: 12/18/2007  Job #: [id number]

## 2010-11-27 NOTE — Cardiovascular Report (Signed)
. [facility]  Patient:    Cindy Holmes                        MRN: [id number] Proc. Date: 04/06/00 Adm. Date:  [id number] Attending:  Talitha Givens CC:         Cindy Holmes, M.D.             Luis Abed, M.D. [facility]             Cardiac Cath Lab                        Cardiac Catheterization  NAME OF PROCEDURE: 1. High-speed rotational atherectomy followed by percutaneous transluminal    coronary angioplasty and stent placement of the proximal left anterior    descending artery. 2. High-speed rotational atherectomy followed by percutaneous transluminal    coronary angioplasty of the first diagonal ostium. 3. Percutaneous transluminal coronary angioplasty followed by stent placement    in the mid left anterior descending artery.  CARDIOLOGIST:                 Daisey Must, M.D.  INDICATIONS:                  Ms. Cindy Holmes is a 60 year old woman who was admitted with unstable angina.  Cardiac catheterization by Dr. Eden Emms yesterday revealed complex disease in the left anterior descending artery. Specifically, there is a complex 95% stenosis in the proximal LAD involving the bifurcation of a large first diagonal branch.  The first diagonal itself also had a 95% stenosis at its origin.  The mid LAD had an 80%.  After reviewing the films we opted to proceed with percutaneous intervention.  DESCRIPTION OF PROCEDURE:     A 6 French sheath was placed in the right femoral vein and an 8 French sheath in the right femoral artery.  The patient had been started on Integrilin prior to this procedure and this was continued.  She was administered heparin to achieve an ACT of greater than 200 seconds.  We used an 8 [place] Q4 guiding catheter with side holes.  A Roto floppy wore was advanced into the distal LAD.  We performed rotational atherectomy of the proximal LAD with a 1.5 mm bur at a speed of 165,000 r.p.m. for 20 seconds times two runs.  We then  went to a 2.0 mm bur, again at 165,000 r.p.m. times 20 seconds for two runs.  Following this we advanced a Hi-Torque floppy wire into the first diagonal.  We then advanced a Maverick balloon over this wire and exchanged through the lumen of the balloon for the Roto floppy wire.  We then performed rotational atherectomy of the ostium of the first diagonal with a 1.5 mm bur at 179,000 r.p.m. for 20 seconds times two runs. Next, we advanced a BMW wire into the distal LAD.  We then used a 3.0 x 20 mm Quantum balloon, which was positioned across the lesion in the mid LAD inflated to 8 atmospheres.  This balloon was pulled back to the proximal LAD and inflated to 12 atmospheres.  Following this we elected to place a stent in the mid LAD.  A 2.75 x 18 mm VX velocity stent was deployed at 14 atmospheres.  We then advanced a 2.5 x 15 mm Maverick balloon over the Roto floppy wire into the diagonal branch.  The ostium of the diagonal was  dilated with this balloon to a pressure of 6 atmospheres.  We then removed the Maverick balloon and the Roto floppy wire from the diagonal.  Following this a 3.5 x 23 mm VX velocity stent was deployed in the proximal LAD across the origin of the first diagonal.  This stent was deployed at 14 atmospheres.  We then readvanced a Hi-Torque floppy wire through the side struts of this stent into the first diagonal.  We then pulse dilated the proximal portion of this stent with a 4.0 x 15 mm Quantum balloon inflated to 14 atmospheres.  Finally, we readvanced the 2.5 x 15 mm Maverick balloon across the side struts of the stent into the first diagonal inflating this to 6 and then 9 atmospheres.  Final angiographic images revealed patency of the LAD and diagonal with 0% residual stenosis in the proximal LAD, 0% residual stenosis in the mid LAD and 50% residual stenosis in the ostium of the first diagonal.  There was TIMII-3 flow into both vessels.  COMPLICATIONS:                 None.  RESULTS:                      Successful high-speed rotational atherectomy with adjunctive stenting and percutaneous transluminal coronary angioplasty of the proximal mid left anterior descending and first diagonal as described. Ninety-five percent stenosis in the proximal left anterior descending was reduced to 0% residual and 80% stenosis in the mid left anterior descending was reduced to 0% residual, and a 95% stenosis in the ostium of the first diagonal was reduced to 50% residual.  There was TIMI 3 flow in both vessels.  PLAN: 1. Integrilin will be continued for an additional 24 hours. 2. Plavix will be administered for four weeks. 3. Patient needs aggressive risk factor reduction. DD:  04/06/00 TD:  04/07/00 Job: [id number]

## 2010-11-27 NOTE — Discharge Summary (Signed)
[place]  Patient:    Cindy Holmes                        MRN: [id number] Adm. Date:  [id number] Disc. Date: [id number] Attending:  Talitha Givens Dictator:   Lavella Hammock, P.A. CC:         Dr. Reginia Naas in [employer], [place].   Referring Physician Discharge [place]  DATE OF BIRTH:  1950-08-28  PROCEDURES: 1. Cardiac catheterization. 2. Coronary arteriogram. 3. Left ventriculogram. 4. High-speed rotational atherectomy x 1. 5. Percutaneous transluminal coronary angioplasty of two vessels. 6. Stent of two vessels.  HOSPITAL COURSE:  Mrs. Cindy Holmes is a 60 year old female with hypertension and family history of coronary artery disease who was seen and evaluated at [facility] on April 04, 2000.  She had been having chest pain for about a month that was described as a burning sensation, and was referred to [facility] for further evaluation and treatment.  She ruled out for an MI and was scheduled for a cardiac catheterization.  She was placed on IV nitroglycerin and heparin, and had only a headache as a side effect.  She had a cardiac catheterization on April 05, 2000 which showed a 20% left main, an LAD with a 95% mid lesion and a 95% ostial lesion in the first diagonal.  There was a 70% distal lesion in the LAD.  The circumflex and RCA were normal. She had mild apical hypokinesis with an EF of 55%.  She was placed on Integrilin and scheduled for percutaneous intervention.  She had percutaneous intervention on April 06, 2000 with HSRA/stent to the proximal LAD, reducing that stenosis from 95 to 0.  She also had HSRA and PTCA to the first diagonal at the ostium, reducing that stenosis to less than 50%, and PTCA and stent to the mid LAD, reducing that stenosis from 80% to 0 with TIMI 3 flow in all vessels.  She was placed on Integrilin for 24 hours.  On September 27, she was doing better, but it was felt that  with the complexity of her lesions, she needed to be held over for one more day.  She was seen by cardiac rehab and ambulated, with oxygen saturation 96% on room air and no chest pain.  She was transferred to a telemetry bed on September 27, and on September 28 she had no chest pain and no shortness of breath.  She had some anemia post catheterization but was asymptomatic from this, and it was felt that she would recover without any intervention or transfusion.  She had no chest pain with ambulation and her blood pressure was under good control.  She was considered stable for discharge on April 08, 2000.  LABORATORY VALUES:  Hemoglobin 9.2, hematocrit 27.0, WBC 7.7, platelets 275. Sodium 139, potassium 3.6, chloride 105, CO2 26, BUN 12, creatinine 0.6, glucose 99.  Post procedure CK-MB 86/5.3, slightly elevated from prior testing.  Total cholesterol 229, triglycerides 207, HDL 50, LDL 138.  DISCHARGE CONDITION:  Stable  CONSULTS:  None.  COMPLICATIONS:  None.  DISCHARGE DIAGNOSES:  1. Coronary artery disease, status post high-speed rotational     atherectomy/stent to the proximal left anterior descending artery,     high-speed rotational atherectomy/percutaneous transluminal coronary     angioplasty to the first diagonal of the ostium, and percutaneous     transluminal coronary angioplasty and stent to the mid left anterior  descending artery with preserved left ventricular function.  2. Hyperlipidemia, patient enrolled in PROVE-IT study this admission.  3. Impressive family history of coronary artery disease.  4. Hypertension.  5. Obesity.  6. Status post cholecystectomy.  7. Status post bilateral tubal ligation.  8. History of migraines.  9. History of arthritis, lower extremities. 10. History of reflex sympathetic dystrophy syndrome in her right arm. 11. History of hard-of-hearing.  DISCHARGE INSTRUCTIONS:  ACTIVITY:  Her activity is to include no lifting, no  driving, no sexual activity for two days.  She may return to work in a week.  DIET:  She is to stick to a 50 g fat, 2200 calorie diet daily.  WOUND CARE:  She is to observe the catheterization site for any burning, swelling, draining, or discomfort.  FOLLOW-UP:  She is to follow up with Dr. Lodema Hong for migraines.  She has an appointment with Dr. Myrtis Ser on October 19 at 2:30 p.m.  DISCHARGE MEDICATIONS:  1. Coated aspirin 325 mg q.d.  2. Lopressor 50 mg one-half tablet b.i.d.  3. Altace 5 mg q.d.  4. PROVE-IT study medication.  5. Nitroglycerin 0.4 mg p.r.n.  6. Plavix 75 mg q.d.  7. Prilosec 20 mg b.i.d.  8. Celebrex - hold while on Plavix.  9. Lasix 40 mg q.d. 10. K-Dur 10 mEq q.d. DD:  04/08/00 TD:  04/08/00 Job: [id number]

## 2010-12-05 ENCOUNTER — Other Ambulatory Visit: Payer: Self-pay | Admitting: Cardiology

## 2010-12-10 ENCOUNTER — Telehealth: Payer: Self-pay | Admitting: Cardiology

## 2010-12-10 MED ORDER — FUROSEMIDE 40 MG PO TABS: 40.0000 mg | ORAL_TABLET | Freq: Every day | ORAL | Status: DC

## 2010-12-10 NOTE — Telephone Encounter (Signed)
Pt needs ferocimide 40mg  qd [facility] in [place]

## 2010-12-14 ENCOUNTER — Other Ambulatory Visit: Payer: Self-pay | Admitting: Cardiology

## 2010-12-16 ENCOUNTER — Encounter: Payer: Self-pay | Admitting: Cardiology

## 2010-12-25 ENCOUNTER — Other Ambulatory Visit: Payer: Self-pay | Admitting: Cardiology

## 2010-12-30 NOTE — Telephone Encounter (Signed)
Lopressor   [facility] [address] in [place], [place]

## 2011-01-15 ENCOUNTER — Ambulatory Visit: Payer: Self-pay | Admitting: Cardiology

## 2011-01-27 ENCOUNTER — Telehealth: Payer: Self-pay

## 2011-02-03 NOTE — Telephone Encounter (Signed)
Left a message on voicemail informing patient to return my call regarding scheduling recall Colonoscopy. Attempted x 2 and will mail letter to patient again.

## 2011-02-10 ENCOUNTER — Encounter: Payer: Self-pay | Admitting: Cardiology

## 2011-02-12 ENCOUNTER — Ambulatory Visit (INDEPENDENT_AMBULATORY_CARE_PROVIDER_SITE_OTHER): Payer: 59 | Admitting: Cardiology

## 2011-02-12 ENCOUNTER — Encounter: Payer: Self-pay | Admitting: Cardiology

## 2011-02-12 DIAGNOSIS — M79606 Pain in leg, unspecified: Secondary | ICD-10-CM

## 2011-02-12 DIAGNOSIS — M79609 Pain in unspecified limb: Secondary | ICD-10-CM

## 2011-02-12 DIAGNOSIS — R609 Edema, unspecified: Secondary | ICD-10-CM

## 2011-02-12 DIAGNOSIS — I2510 Atherosclerotic heart disease of native coronary artery without angina pectoris: Secondary | ICD-10-CM

## 2011-02-12 DIAGNOSIS — I739 Peripheral vascular disease, unspecified: Secondary | ICD-10-CM

## 2011-02-12 DIAGNOSIS — I779 Disorder of arteries and arterioles, unspecified: Secondary | ICD-10-CM

## 2011-02-12 NOTE — Assessment & Plan Note (Signed)
Etiology of the leg discomfort is not clear.  She does have decreased pulses.  We need arterial Dopplers to rule out significant peripheral arterial disease.

## 2011-02-12 NOTE — Assessment & Plan Note (Signed)
Patient has some dependent edema but I believe that she is not total volume overload.  No further workup.

## 2011-02-12 NOTE — Assessment & Plan Note (Signed)
I saw her last I heard a soft bruit.  She had a carotid Doppler and has only minimal disease.  She does not need followup at this time.

## 2011-02-12 NOTE — Progress Notes (Signed)
HPI Patient is seen for cardiology followup.  She was scheduled for next week but had received a note that her visit with today.  I added her to my schedule when I heard this.  Also she has had chest discomfort and arm discomfort.  Patient has known coronary disease.  I saw her last March 03, 2010.  In the past she had an intervention to the LAD. Her last nuclear scan was done in May, 2011.  She has ST changes when walking.  There is anterior breast attenuation but no ischemia.  The patient also mentions that she's had some leg discomfort.  She is concerned about the possibility of peripheral arterial disease. Allergies  Allergen Reactions  . Amoxicillin     REACTION: unspecified  . Codeine Phosphate     REACTION: unspecified    Current Outpatient Prescriptions  Medication Sig Dispense Refill  . aspirin 81 MG tablet Take 81 mg by mouth daily.        Marland Kitchen atorvastatin (LIPITOR) 40 MG tablet take 1 tablet by mouth once daily  90 tablet  3  . furosemide (LASIX) 40 MG tablet take 1 tablet by mouth once daily  90 tablet  3  . furosemide (LASIX) 40 MG tablet Take 1 tablet (40 mg total) by mouth daily.  30 tablet  11  . metoprolol (LOPRESSOR) 50 MG tablet take 1 tablet by mouth once daily  90 tablet  3  . nitroGLYCERIN (NITROSTAT) 0.4 MG SL tablet Place 0.4 mg under the tongue every 5 (five) minutes as needed.        . Omega-3 Fatty Acids (FISH OIL) 1000 MG CAPS Take by mouth daily.        Marland Kitchen PLAVIX 75 MG tablet take 1 tablet by mouth once daily  90 tablet  3  . ramipril (ALTACE) 2.5 MG capsule Take 1 capsule (2.5 mg total) by mouth daily.  30 capsule  6    History   Social History  . Marital Status: Married    Spouse Name: N/A    Number of Children: N/A  . Years of Education: N/A   Occupational History  . Not on file.   Social History Main Topics  . Smoking status: Unknown If Ever Smoked  . Smokeless tobacco: Not on file  . Alcohol Use: No  . Drug Use: Not on file  . Sexually Active:  Not on file   Other Topics Concern  . Not on file   Social History Narrative  . No narrative on file    No family history on file.  Past Medical History  Diagnosis Date  . CAD (coronary artery disease)     PTCA LAD 2001/cath 2002-prox and mid LAD stents patent, PTCA ostium Dx 1/ nuclear 7/09. resting ST's become worse, no scar or ischemia. nuclear 11/18/09, ST changes with walking, no chest pain, anterior breat attenuation with no ischemia  . Overweight     laproscopic adjustable gastric banding with APS standard system  . HTN (hypertension)   . Edema   . Hyperlipidemia   . Stroke     mini stroke in past ???  . Carotid artery disease     Doppler September, 2011, 0-39% bilateral disease mild  . Emotional stress reaction     8/11  . Leg pain     July, 201 to    Past Surgical History  Procedure Date  . Tubal ligation   . Cholecystectomy   . Heart catheterization  with stent placement in 2001  . Laproscopic adjustable gastric  banding     with APS standard system.     ROS  Patient denies fever, chills, headache, sweats, rash, change in vision, change in hearing, chest pain, cough, nausea vomiting, urinary symptoms.  All the systems are reviewed and are negative.  PHYSICAL EXAM Patient is stable.  She is stressed from work.  She is overweight.  She is oriented to person time and place.  Affect is normal.  Head is atraumatic.  There is no jugular venous distention.  Lungs are clear.  Respiratory effort is nonlabored.  Cardiac exam reveals S1-S2.  No clicks or significant murmurs.  The abdomen is soft.  There is no peripheral edema.  There no musculoskeletal deformities.  No skin rashes.  The patient does have decreased pulses in her feet. Filed Vitals:   02/12/11 1021  BP: 141/74  Pulse: 71  Height: 5\' 4"  (1.626 m)  Weight: 229 lb (103.874 kg)    EKG Is not done today.  Patient prefers to have her EKGs done at the time of her nuclear stress study.  ASSESSMENT &  PLAN

## 2011-02-12 NOTE — Assessment & Plan Note (Signed)
Patient has arm discomfort.  This was when the symptoms that she had before her intervention many years ago.  We will proceed with a followup stress nuclear scan.  She would like to walk on a treadmill if possible.   She felt poorly with pharmacologic stress in the past.  I will be in touch with her with information.

## 2011-02-19 ENCOUNTER — Ambulatory Visit: Payer: Self-pay | Admitting: Cardiology

## 2011-02-23 ENCOUNTER — Encounter: Payer: Self-pay | Admitting: *Deleted

## 2011-02-23 ENCOUNTER — Ambulatory Visit: Payer: Self-pay | Admitting: Cardiology

## 2011-03-08 ENCOUNTER — Other Ambulatory Visit (INDEPENDENT_AMBULATORY_CARE_PROVIDER_SITE_OTHER): Payer: 59 | Admitting: *Deleted

## 2011-03-08 ENCOUNTER — Encounter (INDEPENDENT_AMBULATORY_CARE_PROVIDER_SITE_OTHER): Payer: 59 | Admitting: *Deleted

## 2011-03-08 ENCOUNTER — Ambulatory Visit (HOSPITAL_COMMUNITY): Payer: 59 | Attending: Cardiology | Admitting: Radiology

## 2011-03-08 ENCOUNTER — Other Ambulatory Visit: Payer: Self-pay | Admitting: *Deleted

## 2011-03-08 VITALS — Ht 64.0 in | Wt 229.0 lb

## 2011-03-08 DIAGNOSIS — M79606 Pain in leg, unspecified: Secondary | ICD-10-CM

## 2011-03-08 DIAGNOSIS — E7800 Pure hypercholesterolemia, unspecified: Secondary | ICD-10-CM

## 2011-03-08 DIAGNOSIS — R0602 Shortness of breath: Secondary | ICD-10-CM

## 2011-03-08 DIAGNOSIS — R079 Chest pain, unspecified: Secondary | ICD-10-CM

## 2011-03-08 DIAGNOSIS — I2510 Atherosclerotic heart disease of native coronary artery without angina pectoris: Secondary | ICD-10-CM | POA: Insufficient documentation

## 2011-03-08 DIAGNOSIS — I739 Peripheral vascular disease, unspecified: Secondary | ICD-10-CM

## 2011-03-08 LAB — HEPATIC FUNCTION PANEL
ALT: 22 U/L (ref 0–35)
AST: 15 U/L (ref 0–37)
Albumin: 3.9 g/dL (ref 3.5–5.2)
Alkaline Phosphatase: 111 U/L (ref 39–117)
Bilirubin, Direct: 0.1 mg/dL (ref 0.0–0.3)
Total Bilirubin: 0.4 mg/dL (ref 0.3–1.2)
Total Protein: 7.3 g/dL (ref 6.0–8.3)

## 2011-03-08 LAB — LIPID PANEL
Cholesterol: 167 mg/dL (ref 0–200)
HDL: 56.3 mg/dL (ref >39.00)
LDL Cholesterol: 83 mg/dL (ref 0–99)
Total CHOL/HDL Ratio: 3
Triglycerides: 138 mg/dL (ref 0.0–149.0)
VLDL: 27.6 mg/dL (ref 0.0–40.0)

## 2011-03-08 MED ORDER — TECHNETIUM TC 99M TETROFOSMIN IV KIT
33.0000 | PACK | Freq: Once | INTRAVENOUS | Status: AC | PRN
  Administered 2011-03-08: 33 via INTRAVENOUS

## 2011-03-08 MED ORDER — TECHNETIUM TC 99M TETROFOSMIN IV KIT
11.0000 | PACK | Freq: Once | INTRAVENOUS | Status: AC | PRN
  Administered 2011-03-08: 11 via INTRAVENOUS

## 2011-03-08 NOTE — Progress Notes (Signed)
[facility] SITE 3 NUCLEAR MED [address] [place] [address] [phone]  Cardiology Nuclear Med Study  Cindy Holmes is a 60 y.o. female [id number] [birth year]   Nuclear Med Background Indication for Stress Test:  Evaluation for Ischemia, Stent Patency and PTCA Patency History: '02 Angioplasty, '02 Heart Catheterization: NL LVF patent stent , 11/18/09 Myocardial Perfusion Study: (-) EF 79% and '01 Stents: LADx2 Cardiac Risk Factors: Carotid Disease, Family History - CAD, Hypertension, Lipids and TIA  Symptoms:  Chest Pain, DOE, Palpitations and SOB   Nuclear Pre-Procedure Caffeine/Decaff Intake:  None NPO After: 6:00 pm   Lungs: clear IV 0.9% NS with Angio Cath:  22g  IV Site: R Hand  IV Started by:  Cathlyn Parsons, RN  Chest Size (in):  40 Cup Size: D  Height: 5\' 4"  (1.626 m)  Weight:  229 lb (103.874 kg)  BMI:  Body mass index is 39.31 kg/(m^2). Tech Comments:  Metoprolol held x 24 hrs    Nuclear Med Study 1 or 2 day study: 1 day  Stress Test Type:  Stress  Reading MD: Willa Rough, MD  Order Authorizing Provider:  J.Katz  Resting Radionuclide: Technetium 21m Tetrofosmin  Resting Radionuclide Dose: 11.0 mCi   Stress Radionuclide:  Technetium 7m Tetrofosmin  Stress Radionuclide Dose: 33.0 mCi           Stress Protocol Rest HR: 86 Stress HR: 148  Rest BP: 139/77 Stress BP: 165/66  Exercise Time (min): 5:00 METS: 7.0   Predicted Max HR: 161 bpm % Max HR: 91.93 bpm Rate Pressure Product: 21308   Dose of Adenosine (mg):  n/a Dose of Lexiscan: n/a mg  Dose of Atropine (mg): n/a Dose of Dobutamine: n/a mcg/kg/min (at max HR)  Stress Test Technologist: Milana Na, EMT-P  Nuclear Technologist:  Doyne Keel, CNMT     Rest Procedure:  Myocardial perfusion imaging was performed at rest 45 minutes following the intravenous administration of Technetium 4m Tetrofosmin. Rest ECG: NSR  Stress Procedure:  The patient exercised for 5:00.   The patient stopped due to sob and denied any chest pain.  There were + significant ST-T wave changes( same as the previous test) and occ pacs.  Technetium 106m Tetrofosmin was injected at peak exercise and myocardial perfusion imaging was performed after a brief delay. Stress ECG: No significant change from baseline ECG  QPS Raw Data Images:  Patient motion noted; appropriate software correction applied. Stress Images:  Normal homogeneous uptake in all areas of the myocardium. Rest Images:  Normal homogeneous uptake in all areas of the myocardium. Subtraction (SDS):  No evidence of ischemia. Transient Ischemic Dilatation (Normal <1.22):  0.89 Lung/Heart Ratio (Normal <0.45):  0.30  Quantitative Gated Spect Images QGS EDV:  72 ml QGS ESV:  13 ml QGS cine images:  Normal Wall Motion QGS EF: 82%  Impression Exercise Capacity:  Limited BP Response:  Normal blood pressure response. Clinical Symptoms:  SOB ECG Impression:  No significant ST segment change suggestive of ischemia. Comparison with Prior Nuclear Study: No significant change from previous study  Overall Impression:  Normal stress nuclear study.  There are nonspecific EKG changes at rest and stress.    Marland Kitchen

## 2011-03-10 ENCOUNTER — Telehealth: Payer: Self-pay | Admitting: Cardiology

## 2011-03-10 NOTE — Telephone Encounter (Signed)
Pt had test done on Monday and has not heard back the results. Please return pt call to discuss.

## 2011-03-10 NOTE — Telephone Encounter (Signed)
Patient called to have test results. Stress test, and blood work results given. Patient is aware that Dr. Myrtis Ser has not review these test at this time. Patient had also LE Arterial doppler, Same day. Those results are not in EPIC yet. Patient would like to be call as soon as they are available.

## 2011-03-11 ENCOUNTER — Encounter: Payer: Self-pay | Admitting: Cardiology

## 2011-03-11 DIAGNOSIS — I10 Essential (primary) hypertension: Secondary | ICD-10-CM | POA: Insufficient documentation

## 2011-03-11 DIAGNOSIS — M79606 Pain in leg, unspecified: Secondary | ICD-10-CM | POA: Insufficient documentation

## 2011-03-11 DIAGNOSIS — I639 Cerebral infarction, unspecified: Secondary | ICD-10-CM | POA: Insufficient documentation

## 2011-03-11 DIAGNOSIS — F439 Reaction to severe stress, unspecified: Secondary | ICD-10-CM | POA: Insufficient documentation

## 2011-03-11 DIAGNOSIS — E785 Hyperlipidemia, unspecified: Secondary | ICD-10-CM | POA: Insufficient documentation

## 2011-03-11 NOTE — Telephone Encounter (Signed)
Dr Myrtis Ser reviewed pt's studies.  She was called and told they all looked good and that Dr Myrtis Ser will see her back in one year per Dr Myrtis Ser.   At that time he will decide if further testing is required at that time.

## 2011-03-31 LAB — I-STAT 8, (EC8 V) (CONVERTED LAB)
Acid-Base Excess: 3 — ABNORMAL HIGH
BUN: 9
Bicarbonate: 27.5 — ABNORMAL HIGH
Chloride: 104
Glucose, Bld: 95
HCT: 42
Hemoglobin: 14.3
Operator id: 196461
Potassium: 3.8
Sodium: 136
TCO2: 29
pCO2, Ven: 40.1 — ABNORMAL LOW
pH, Ven: 7.444 — ABNORMAL HIGH

## 2011-03-31 LAB — CBC
HCT: 38.4
Hemoglobin: 13
MCHC: 33.9
MCV: 80.5
Platelets: 349
RBC: 4.77
RDW: 15.4
WBC: 8.3

## 2011-03-31 LAB — DIFFERENTIAL
Basophils Absolute: 0
Basophils Relative: 1
Eosinophils Absolute: 0.1
Eosinophils Relative: 1
Lymphocytes Relative: 30
Lymphs Abs: 2.4
Monocytes Absolute: 0.4
Monocytes Relative: 5
Neutro Abs: 5.3
Neutrophils Relative %: 64

## 2011-03-31 LAB — POCT I-STAT CREATININE: Creatinine, Ser: 0.7

## 2011-03-31 LAB — POCT CARDIAC MARKERS: Operator id: 196461

## 2011-04-23 LAB — CBC
HCT: 33.5 — ABNORMAL LOW
HCT: 37.3
MCHC: 33.8
MCHC: 34
MCV: 79.9
MCV: 80
Platelets: 305
RBC: 4.67
RDW: 15.7 — ABNORMAL HIGH

## 2011-04-23 LAB — BASIC METABOLIC PANEL
BUN: 8
CO2: 27
Chloride: 103
Creatinine, Ser: 0.64
GFR calc Af Amer: >60
Potassium: 3.7

## 2011-04-23 LAB — DIFFERENTIAL
Basophils Absolute: 0
Basophils Relative: 1
Eosinophils Absolute: 0
Eosinophils Relative: 0
Lymphocytes Relative: 16
Monocytes Absolute: 0.3

## 2011-04-23 LAB — PREGNANCY, URINE: Preg Test, Ur: NEGATIVE

## 2011-04-28 LAB — URINALYSIS, ROUTINE W REFLEX MICROSCOPIC
Bilirubin Urine: NEGATIVE
Ketones, ur: NEGATIVE
Nitrite: NEGATIVE
Protein, ur: NEGATIVE
Urobilinogen, UA: 0.2

## 2011-04-28 LAB — URINE CULTURE
Colony Count: NO GROWTH
Culture: NO GROWTH

## 2011-05-05 ENCOUNTER — Other Ambulatory Visit: Payer: Self-pay | Admitting: *Deleted

## 2011-05-05 MED ORDER — RAMIPRIL 2.5 MG PO CAPS: 2.5000 mg | ORAL_CAPSULE | Freq: Every day | ORAL | Status: DC

## 2011-05-20 ENCOUNTER — Other Ambulatory Visit: Payer: Self-pay | Admitting: Cardiology

## 2011-10-14 ENCOUNTER — Encounter: Payer: Self-pay | Admitting: Gastroenterology

## 2011-11-26 ENCOUNTER — Other Ambulatory Visit: Payer: Self-pay | Admitting: Cardiology

## 2011-12-05 ENCOUNTER — Other Ambulatory Visit: Payer: Self-pay | Admitting: Cardiology

## 2011-12-22 ENCOUNTER — Other Ambulatory Visit: Payer: Self-pay | Admitting: Cardiology

## 2012-05-26 ENCOUNTER — Other Ambulatory Visit: Payer: Self-pay

## 2012-05-26 MED ORDER — CLOPIDOGREL BISULFATE 75 MG PO TABS: 75.0000 mg | ORAL_TABLET | Freq: Every day | ORAL | Status: DC

## 2012-05-26 MED ORDER — ATORVASTATIN CALCIUM 40 MG PO TABS: 40.0000 mg | ORAL_TABLET | Freq: Every day | ORAL | Status: DC

## 2012-06-05 ENCOUNTER — Other Ambulatory Visit: Payer: Self-pay

## 2012-06-05 MED ORDER — ATORVASTATIN CALCIUM 40 MG PO TABS: 40.0000 mg | ORAL_TABLET | Freq: Every day | ORAL | Status: DC

## 2012-06-09 ENCOUNTER — Other Ambulatory Visit: Payer: Self-pay | Admitting: *Deleted

## 2012-06-09 MED ORDER — NITROGLYCERIN 0.4 MG SL SUBL: 0.4000 mg | SUBLINGUAL_TABLET | SUBLINGUAL | Status: DC | PRN

## 2012-06-27 ENCOUNTER — Other Ambulatory Visit: Payer: Self-pay

## 2012-06-27 MED ORDER — RAMIPRIL 2.5 MG PO CAPS: 2.5000 mg | ORAL_CAPSULE | Freq: Every day | ORAL | Status: DC

## 2012-07-26 ENCOUNTER — Other Ambulatory Visit: Payer: Self-pay | Admitting: *Deleted

## 2012-07-26 MED ORDER — CLOPIDOGREL BISULFATE 75 MG PO TABS: 75.0000 mg | ORAL_TABLET | Freq: Every day | ORAL | Status: DC

## 2012-08-17 ENCOUNTER — Other Ambulatory Visit: Payer: Self-pay

## 2012-08-17 MED ORDER — ATORVASTATIN CALCIUM 40 MG PO TABS: 40.0000 mg | ORAL_TABLET | Freq: Every day | ORAL | Status: DC

## 2012-10-25 ENCOUNTER — Other Ambulatory Visit: Payer: Self-pay | Admitting: *Deleted

## 2012-10-25 MED ORDER — ATORVASTATIN CALCIUM 40 MG PO TABS: 40.0000 mg | ORAL_TABLET | Freq: Every day | ORAL | Status: DC

## 2012-10-25 NOTE — Telephone Encounter (Signed)
Spoke with patient she states she will call to make appt get more refills. I told her i can only fill it for 30 days due to her not having an appt

## 2012-11-29 ENCOUNTER — Telehealth: Payer: Self-pay | Admitting: Cardiology

## 2012-11-29 NOTE — Telephone Encounter (Signed)
New Prob     Pt states she is ready to schedule her nuclear test, did not really clarify which test it is and would like to speak to nurse regarding this.

## 2012-11-29 NOTE — Telephone Encounter (Signed)
Pt called in demanding to be scheduled for a nuclear stress test.  Advised pt that she would probably need to see Dr. Myrtis Ser first since she has not been seen since 2012.  Pt stated that she always comes in for a nuclear study first and then sees Dr. Myrtis Ser.  I politely reminded patient that in 2012 she saw Dr. Myrtis Ser in the office and had the test 2 weeks later.  Pt is also demanding a carotid doppler because she states " I can feel the stuff in my neck".  Advised pt that I will forward her request to Dr. Myrtis Ser and his nurse, Larita Fife, and that she could expect a return call from [facility].  Pt agreed.

## 2012-12-01 NOTE — Telephone Encounter (Signed)
Pt wants to have a Carotid Duplex and Myoview before she has f/u with Dr. Myrtis Ser. Will discuss with Dr. Myrtis Ser.

## 2012-12-01 NOTE — Telephone Encounter (Signed)
LMTCB

## 2012-12-04 ENCOUNTER — Telehealth: Payer: Self-pay | Admitting: Physician Assistant

## 2012-12-04 NOTE — Telephone Encounter (Signed)
Patient called on Memorial Day to the answering service to have her Lipitor filled.  She has been out of it for 2 weeks. She has not been seen since 2012.  Notes in chart indicate she needs f/u arranged.  She spoke to the RN last week to schedule carotid dopplers and a stress test but nothing is scheduled.  I explained to the patient that the answering service was for emergency phone calls.  I tried to explain to her that I would call in a couple weeks of Lipitor until she could be scheduled for a follow up appt.  But, the follow up would have to be scheduled tomorrow once the office opened.  At this, she said, "Forget it!  I will get somebody else to help me."  She then hung up the phone.  Tereso Newcomer, PA-C   12/04/2012 11:58 AM

## 2012-12-05 ENCOUNTER — Other Ambulatory Visit: Payer: Self-pay

## 2012-12-05 MED ORDER — ATORVASTATIN CALCIUM 40 MG PO TABS: 40.0000 mg | ORAL_TABLET | Freq: Every day | ORAL | Status: DC

## 2012-12-05 NOTE — Telephone Encounter (Signed)
**Note De-Identified  Obfuscation** Refill for 30 tablets with no refill of Lipitor sent to [facility] to fill.

## 2012-12-08 NOTE — Telephone Encounter (Signed)
I cannot order any exercise test for this patient until she is seen again in the office. Please try to schedule her an appointment sometime in July

## 2012-12-08 NOTE — Telephone Encounter (Signed)
Attempted to schedule the pt a f/u appt with Dr. Myrtis Ser on July 15 or 30 but pt refused stating she has to be seen the first week of July. Also she stated "I do not want to make 3 trips to that office when you all could just order the tests and when I f/u with Dr. Myrtis Ser he can give me my results. I explained to pt that Dr Myrtis Ser wants to see her in clinic before he orders any test. She stated "well Ill call back later then". I then explained to her that she must maintain her f/u's in order for [place] to continue refilling her medications and after a pause she hung up the phone without a response.

## 2012-12-08 NOTE — Telephone Encounter (Signed)
**Note De-Identified  Obfuscation** See phone note from 12/04/12.

## 2013-01-16 ENCOUNTER — Other Ambulatory Visit: Payer: Self-pay

## 2013-01-16 MED ORDER — FUROSEMIDE 40 MG PO TABS: 40.0000 mg | ORAL_TABLET | Freq: Every day | ORAL | Status: DC

## 2013-02-20 ENCOUNTER — Encounter: Payer: Self-pay | Admitting: *Deleted

## 2013-02-20 ENCOUNTER — Ambulatory Visit (INDEPENDENT_AMBULATORY_CARE_PROVIDER_SITE_OTHER): Payer: 59 | Admitting: Cardiology

## 2013-02-20 VITALS — BP 132/80 | HR 92 | Ht 64.0 in | Wt 233.0 lb

## 2013-02-20 DIAGNOSIS — I10 Essential (primary) hypertension: Secondary | ICD-10-CM

## 2013-02-20 DIAGNOSIS — R079 Chest pain, unspecified: Secondary | ICD-10-CM

## 2013-02-20 DIAGNOSIS — I779 Disorder of arteries and arterioles, unspecified: Secondary | ICD-10-CM

## 2013-02-20 DIAGNOSIS — I7789 Other specified disorders of arteries and arterioles: Secondary | ICD-10-CM

## 2013-02-20 DIAGNOSIS — E785 Hyperlipidemia, unspecified: Secondary | ICD-10-CM

## 2013-02-20 DIAGNOSIS — R9431 Abnormal electrocardiogram [ECG] [EKG]: Secondary | ICD-10-CM

## 2013-02-20 DIAGNOSIS — M069 Rheumatoid arthritis, unspecified: Secondary | ICD-10-CM | POA: Insufficient documentation

## 2013-02-20 DIAGNOSIS — M0520 Rheumatoid vasculitis with rheumatoid arthritis of unspecified site: Secondary | ICD-10-CM

## 2013-02-20 DIAGNOSIS — I2510 Atherosclerotic heart disease of native coronary artery without angina pectoris: Secondary | ICD-10-CM

## 2013-02-20 NOTE — Progress Notes (Signed)
HPI  The patient is seen to followup coronary artery disease. She has known coronary disease. She had an intervention in the past. Her last exercise test was in 2012. She does have chest discomfort and exertional shortness of breath. In addition she has a new diagnosis since I saw her last. She has significant rheumatoid arthritis. She is on prednisone. I think that methotrexate was tried but she felt poorly. A different medicine is now being used. She has pain in her feet. She also has pain and swelling in her hands. There is discomfort in her neck.  She also mentions that she has decreased hearing.  Allergies  Allergen Reactions  . Amoxicillin     REACTION: unspecified  . Codeine Phosphate     REACTION: unspecified    Current Outpatient Prescriptions  Medication Sig Dispense Refill  . aspirin 81 MG tablet Take 81 mg by mouth daily.        Marland Kitchen atorvastatin (LIPITOR) 40 MG tablet Take 1 tablet (40 mg total) by mouth daily.  30 tablet  0  . clopidogrel (PLAVIX) 75 MG tablet Take 1 tablet (75 mg total) by mouth daily.  30 tablet  6  . furosemide (LASIX) 40 MG tablet Take 1 tablet (40 mg total) by mouth daily.  30 tablet  0  . metoprolol (LOPRESSOR) 50 MG tablet take 1 tablet by mouth once daily  90 tablet  3  . nitroGLYCERIN (NITROSTAT) 0.4 MG SL tablet Place 1 tablet (0.4 mg total) under the tongue every 5 (five) minutes as needed for chest pain.  25 tablet  3  . Omega-3 Fatty Acids (FISH OIL) 1000 MG CAPS Take by mouth daily.        . ramipril (ALTACE) 2.5 MG capsule Take 1 capsule (2.5 mg total) by mouth daily.  30 capsule  11   No current facility-administered medications for this visit.    History   Social History  . Marital Status: Married    Spouse Name: N/A    Number of Children: N/A  . Years of Education: N/A   Occupational History  . Not on file.   Social History Main Topics  . Smoking status: Never Smoker   . Smokeless tobacco: Not on file  . Alcohol Use: No  .  Drug Use: No  . Sexually Active: Not on file   Other Topics Concern  . Not on file   Social History Narrative  . No narrative on file    Family History  Problem Relation Age of Onset  . Heart attack Mother   . Cancer      family history  . Diabetes Brother   . Diabetes Sister   . Heart attack      family history    Past Medical History  Diagnosis Date  . CAD (coronary artery disease)     PTCA LAD 2001/cath 2002-prox and mid LAD stents patent, PTCA ostium Dx 1/ nuclear 7/09. resting ST's become worse, no scar or ischemia. nuclear 11/18/09, ST changes with walking, no chest pain, anterior breat attenuation with no ischemia  . Overweight(278.02)     laproscopic adjustable gastric banding with APS standard system  . HTN (hypertension)   . Edema   . Hyperlipidemia   . Stroke     mini stroke in past ???  . Carotid artery disease     Doppler September, 2011, 0-39% bilateral disease mild  . Emotional stress reaction     8/11  . Leg pain  July, 2012, Arterial Dopplers March 08, 2011 normal  . Rheumatoid arteritis     Past Surgical History  Procedure Laterality Date  . Tubal ligation    . Cholecystectomy    . Cardiac catheterization      with stent placement in 2001  . Laproscopic adjustable gastric  banding      with APS standard system.     Patient Active Problem List   Diagnosis Date Noted  . CAD (coronary artery disease)   . HTN (hypertension)   . Hyperlipidemia   . Stroke   . Emotional stress reaction   . Leg pain   . Carotid artery disease   . OVERWEIGHT/OBESITY 11/04/2008  . EDEMA 11/04/2008    ROS   The patient denies fever, chills, headache, sweats, rash, change in vision, cough, urinary symptoms. She does have nausea at times from her medicines. All other systems are reviewed and are negative.  PHYSICAL EXAM  Patient is overweight but stable. She is oriented to person time and place. Affect is normal. Unfortunately she feels poorly with all of  her medical problems. There is no jugulovenous distention. Lungs are clear. Respiratory effort is nonlabored. Cardiac exam reveals S1 and S2. There no clicks or significant murmurs. The abdomen is soft. There is no peripheral edema. There no musculoskeletal deformities there no skin rashes.  Filed Vitals:   02/20/13 1424  BP: 132/80  Pulse: 92  Height: 5\' 4"  (1.626 m)  Weight: 233 lb (105.688 kg)   EKG is done today and reviewed by me. The patient has sinus rhythm. There are diffuse nonspecific ST-T wave changes. There is no old EKG for comparison. In the past she is preferred to have her EKGs only at the time of her nuclear studies. I cannot access her last nuclear EKG at this time.  ASSESSMENT & PLAN

## 2013-02-20 NOTE — Assessment & Plan Note (Signed)
This is a new diagnosis since I saw the patient last. Unfortunately she has significant symptoms. She is being treated carefully by rheumatology.

## 2013-02-20 NOTE — Assessment & Plan Note (Signed)
The patient has documented carotid artery disease. Her last evaluation was 2011. Carotid Doppler is being scheduled.

## 2013-02-20 NOTE — Assessment & Plan Note (Signed)
Patient is on appropriate medication for her lipid abnormality.

## 2013-02-20 NOTE — Assessment & Plan Note (Signed)
Blood pressure is controlled. No change in therapy. 

## 2013-02-20 NOTE — Assessment & Plan Note (Signed)
This is a new diagnosis for the patient since I saw her last. Unfortunately she has significant symptoms. She is being treated carefully by rheumatology.

## 2013-02-20 NOTE — Assessment & Plan Note (Addendum)
The patient has significant known coronary disease. She had interventions in 2001 in 2002. Nuclear studies done in 2009 and 200 12 revealed a normal ejection fraction. There was no obvious ischemia. Currently she is having exertional shortness of breath. She has some chest discomfort. Her EKG is abnormal. We will proceed with a pharmacologic stress nuclear scan. I will be in touch with her with the information.  As part of today's evaluation I spent greater than 25 minutes with the patient. More than half of this time was spent with direct contact with her. She described to me all aspects of her rheumatoid pain in addition to her chest pain and shortness of breath.

## 2013-02-20 NOTE — Patient Instructions (Signed)
**Note De-identified  Obfuscation** Your physician has requested that you have a lexiscan myoview. For further information please visit [email or web address]. Please follow instruction sheet, as given.  Your physician has requested that you have a carotid duplex. This test is an ultrasound of the carotid arteries in your neck. It looks at blood flow through these arteries that supply the brain with blood. Allow one hour for this exam. There are no restrictions or special instructions.  Your physician wants you to follow-up in: 1 year. You will receive a reminder letter in the mail two months in advance. If you don't receive a letter, please call our office to schedule the follow-up appointment.  

## 2013-02-27 ENCOUNTER — Ambulatory Visit (HOSPITAL_COMMUNITY): Payer: 59 | Attending: Cardiology | Admitting: Radiology

## 2013-02-27 ENCOUNTER — Encounter (INDEPENDENT_AMBULATORY_CARE_PROVIDER_SITE_OTHER): Payer: 59

## 2013-02-27 VITALS — BP 121/70 | Ht 63.0 in | Wt 234.0 lb

## 2013-02-27 DIAGNOSIS — Z8249 Family history of ischemic heart disease and other diseases of the circulatory system: Secondary | ICD-10-CM | POA: Insufficient documentation

## 2013-02-27 DIAGNOSIS — I10 Essential (primary) hypertension: Secondary | ICD-10-CM | POA: Insufficient documentation

## 2013-02-27 DIAGNOSIS — I6529 Occlusion and stenosis of unspecified carotid artery: Secondary | ICD-10-CM | POA: Insufficient documentation

## 2013-02-27 DIAGNOSIS — R002 Palpitations: Secondary | ICD-10-CM | POA: Insufficient documentation

## 2013-02-27 DIAGNOSIS — R0789 Other chest pain: Secondary | ICD-10-CM

## 2013-02-27 DIAGNOSIS — Z8673 Personal history of transient ischemic attack (TIA), and cerebral infarction without residual deficits: Secondary | ICD-10-CM | POA: Insufficient documentation

## 2013-02-27 DIAGNOSIS — R9431 Abnormal electrocardiogram [ECG] [EKG]: Secondary | ICD-10-CM

## 2013-02-27 DIAGNOSIS — R079 Chest pain, unspecified: Secondary | ICD-10-CM | POA: Insufficient documentation

## 2013-02-27 DIAGNOSIS — I2510 Atherosclerotic heart disease of native coronary artery without angina pectoris: Secondary | ICD-10-CM

## 2013-02-27 DIAGNOSIS — R5381 Other malaise: Secondary | ICD-10-CM | POA: Insufficient documentation

## 2013-02-27 DIAGNOSIS — R0609 Other forms of dyspnea: Secondary | ICD-10-CM | POA: Insufficient documentation

## 2013-02-27 DIAGNOSIS — R0989 Other specified symptoms and signs involving the circulatory and respiratory systems: Secondary | ICD-10-CM | POA: Insufficient documentation

## 2013-02-27 DIAGNOSIS — I779 Disorder of arteries and arterioles, unspecified: Secondary | ICD-10-CM

## 2013-02-27 MED ORDER — TECHNETIUM TC 99M SESTAMIBI GENERIC - CARDIOLITE
11.0000 | Freq: Once | INTRAVENOUS | Status: AC | PRN
  Administered 2013-02-27: 11 via INTRAVENOUS

## 2013-02-27 MED ORDER — REGADENOSON 0.4 MG/5ML IV SOLN
0.4000 mg | Freq: Once | INTRAVENOUS | Status: AC
  Administered 2013-02-27: 0.4 mg via INTRAVENOUS

## 2013-02-27 MED ORDER — TECHNETIUM TC 99M SESTAMIBI GENERIC - CARDIOLITE
33.0000 | Freq: Once | INTRAVENOUS | Status: AC | PRN
  Administered 2013-02-27: 33 via INTRAVENOUS

## 2013-02-27 NOTE — Progress Notes (Signed)
[facility] SITE 3 NUCLEAR MED [address] [place], [place] [address] [phone]    Cardiology Nuclear Med Study  Cindy Holmes is a 62 y.o. female     MRN : [id number]     DOB: 02/12/1951  Procedure Date: 02/27/2013  Nuclear Med Background Indication for Stress Test:  Evaluation for Ischemia and PTCA/Stent Patency History:  '01 Angioplasty/Stent LAD, '02 PTCA restenosed LAD Heart Cath: EF: 74% patent stents '12 MPS: NL EF: 82% Cardiac Risk Factors: Carotid Disease, Family History - CAD, Hypertension and TIA  Symptoms:  Chest Pain, DOE, Fatigue and Palpitations   Nuclear Pre-Procedure Caffeine/Decaff Intake:  None > 12 hrs NPO After: 6:00pm   Lungs:  clear O2 Sat: 96% on room air. IV 0.9% NS with Angio Cath:  24g  IV Site: R Antecubital , tolerated well IV Started by:  Irean Hong, RN  Chest Size (in):  40 Cup Size: D  Height: 5\' 3"  (1.6 m)  Weight:  234 lb (106.142 kg)  BMI:  Body mass index is 41.46 kg/(m^2). Tech Comments:  Took Metoprolol last night    Nuclear Med Study 1 or 2 day study: 1 day  Stress Test Type:  Lexiscan  Reading MD: Cassell Clement, MD  Order Authorizing Provider:  Willa Rough, MD  Resting Radionuclide: Technetium 22m Sestamibi  Resting Radionuclide Dose: 11.0 mCi   Stress Radionuclide:  Technetium 72m Sestamibi  Stress Radionuclide Dose: 33.0 mCi           Stress Protocol Rest HR: 69 Stress HR: 91  Rest BP: 121/70 Stress BP: 152/65  Exercise Time (min): n/a METS: n/a   Predicted Max HR: 159 bpm % Max HR: 57.23 bpm Rate Pressure Product: 21308   Dose of Adenosine (mg):  n/a Dose of Lexiscan: 0.4 mg  Dose of Atropine (mg): n/a Dose of Dobutamine: n/a mcg/kg/min (at max HR)  Stress Test Technologist: Milana Na, EMT-P  Nuclear Technologist:  Domenic Polite, CNMT     Rest Procedure:  Myocardial perfusion imaging was performed at rest 45 minutes following the intravenous administration of Technetium 52m  Sestamibi. Rest ECG: NSR with non-specific ST-T wave changes  Stress Procedure:  The patient received IV Lexiscan 0.4 mg over 15-seconds.  Technetium 2m Sestamibi injected at 30-seconds.  This patient had sob and a hot flash with the Lexiscan injection. Quantitative spect images were obtained after a 45 minute delay. Stress ECG: No significant change from baseline ECG  QPS Raw Data Images:  Normal; no motion artifact; normal heart/lung ratio. Stress Images:  Normal homogeneous uptake in all areas of the myocardium. Rest Images:  Normal homogeneous uptake in all areas of the myocardium. Subtraction (SDS):  No evidence of ischemia. Transient Ischemic Dilatation (Normal <1.22):  NA Lung/Heart Ratio (Normal <0.45):  0.44  Quantitative Gated Spect Images QGS EDV:  66 ml QGS ESV:  11 ml  Impression Exercise Capacity:  Lexiscan with no exercise. BP Response:  Normal blood pressure response. Clinical Symptoms:  No chest pain. ECG Impression:  No significant ST segment change suggestive of ischemia. Comparison with Prior Nuclear Study: No images to compare  Overall Impression:  Normal stress nuclear study.  LV Ejection Fraction: 83%.  LV Wall Motion:  NL LV Function; NL Wall Motion  [employer]

## 2013-02-28 ENCOUNTER — Telehealth: Payer: Self-pay | Admitting: Cardiology

## 2013-02-28 NOTE — Telephone Encounter (Signed)
Returned call to patient she wanted to know results of myoview and carotid dopplers done 02/27/13.Patient was told results not available at present will send message to Dr.Katz's nurse.

## 2013-02-28 NOTE — Telephone Encounter (Signed)
New Problem ° °Pt calling for results.  °

## 2013-03-01 ENCOUNTER — Encounter: Payer: Self-pay | Admitting: Cardiology

## 2013-03-01 NOTE — Telephone Encounter (Signed)
Follow up  ° ° ° ° °Pt is returning your call  °

## 2013-03-02 ENCOUNTER — Encounter: Payer: Self-pay | Admitting: Cardiology

## 2013-03-08 ENCOUNTER — Other Ambulatory Visit: Payer: Self-pay

## 2013-03-08 MED ORDER — FUROSEMIDE 40 MG PO TABS: 40.0000 mg | ORAL_TABLET | Freq: Every day | ORAL | Status: DC

## 2013-03-08 MED ORDER — ATORVASTATIN CALCIUM 40 MG PO TABS: 40.0000 mg | ORAL_TABLET | Freq: Every day | ORAL | Status: DC

## 2013-03-08 MED ORDER — RAMIPRIL 2.5 MG PO CAPS: 2.5000 mg | ORAL_CAPSULE | Freq: Every day | ORAL | Status: DC

## 2013-03-08 MED ORDER — CLOPIDOGREL BISULFATE 75 MG PO TABS: 75.0000 mg | ORAL_TABLET | Freq: Every day | ORAL | Status: DC

## 2013-03-08 MED ORDER — NITROGLYCERIN 0.4 MG SL SUBL: 0.4000 mg | SUBLINGUAL_TABLET | SUBLINGUAL | Status: DC | PRN

## 2013-03-08 MED ORDER — METOPROLOL TARTRATE 50 MG PO TABS: ORAL_TABLET | ORAL | Status: DC

## 2013-06-19 ENCOUNTER — Other Ambulatory Visit: Payer: Self-pay | Admitting: Orthopedic Surgery

## 2013-06-19 DIAGNOSIS — M25511 Pain in right shoulder: Secondary | ICD-10-CM

## 2013-06-28 ENCOUNTER — Other Ambulatory Visit: Payer: 59

## 2013-07-02 ENCOUNTER — Ambulatory Visit
Admission: RE | Admit: 2013-07-02 | Discharge: 2013-07-02 | Disposition: A | Discharge status: Home / Self Care | Payer: 59 | Source: Ambulatory Visit | Attending: Orthopedic Surgery | Admitting: Orthopedic Surgery

## 2013-07-02 DIAGNOSIS — M25511 Pain in right shoulder: Secondary | ICD-10-CM

## 2013-07-07 ENCOUNTER — Other Ambulatory Visit: Payer: 59

## 2013-08-13 ENCOUNTER — Telehealth: Payer: Self-pay | Admitting: Cardiology

## 2013-08-13 NOTE — Telephone Encounter (Signed)
Received request from Nurse fax box, documents faxed for surgical clearance. To: [facility] Fax number: [phone] Attention: [phone]./kdm

## 2013-09-14 ENCOUNTER — Encounter: Payer: Self-pay | Admitting: Cardiology

## 2013-09-14 NOTE — Progress Notes (Signed)
I have received information from Dr. Mardelle Matte of orthopedics. His office note stating that he will coordinate planning for arthroscopic rotator cuff repair. I saw the patient last August, 2014. After that time a nuclear scan was done showing no significant ischemia. From the cardiac viewpoint, the patient can be cleared for her orthopedic surgery.  Plavix can be held for 5 days before the procedure. It would be optimal for the patient to remain on aspirin if possible. If not possible, the aspirin can also be held for 5 days. It is important to restart aspirin and Plavix after the procedure is successfully completed.  Daryel November, MD

## 2013-10-18 ENCOUNTER — Other Ambulatory Visit: Payer: Self-pay | Admitting: Orthopedic Surgery

## 2013-11-07 ENCOUNTER — Encounter: Payer: Self-pay | Admitting: Cardiology

## 2013-11-14 ENCOUNTER — Telehealth: Payer: Self-pay | Admitting: Cardiology

## 2013-11-14 ENCOUNTER — Encounter (HOSPITAL_BASED_OUTPATIENT_CLINIC_OR_DEPARTMENT_OTHER): Payer: Self-pay | Admitting: *Deleted

## 2013-11-14 NOTE — Telephone Encounter (Signed)
The pt states that someone from [facility] office told her to stop taking all of her medications 2 weeks prior to her surgery that is scheduled for this Friday. She states that she had blood work drawn and that her cholesterol is elevated and she feels it is because she stopped taking her medications 2 weeks ago. The pt is advised that Dr Ron Parker only recommended that she stop taking Plavix 5 days prior to surgery and that she should contact her surgeons office for clarification on her medication instructions for surgery and that we have not received her lab results yet. She verbalized understanding.

## 2013-11-14 NOTE — Progress Notes (Addendum)
Bring all medications. Spoke with Jeani Hawking Dr. Ron Parker nurse - pt had Carotid duplex, and stress test 02/2013 but no EKG since 02/2013. Jeani Hawking suggested we get EKG with our labs - she said they would have done an EKG if pt came in for cardiac clearance. Also Jeani Hawking said pt called told her she had stopped taking all of her medications including BP meds. Explained to [place] pt told me only that she had stopped taking Plavix. Dr. Ron Parker suggested BP check as well. Pt coming tomorrow for BMET, EKG and BP check. DR. Al Corpus reviewed history, cardiac clearance note - found under encounters listed as Documentation- ok for surgery. Told Pt to pack an overnight bag.

## 2013-11-14 NOTE — Telephone Encounter (Signed)
New message     Want you to know that rhumolotogist will be faxing most recent labs for Dr Ron Parker to see.  Her cholesterol was high and she has been off all medications for 2wk because of upcoming surgery.  Since pt has been off medications, it seems to have gone up a lot.  Surgery is scheduled for this Friday---rotocuff surgery.  What will happen if she stayed off cholesterol medication for another week?

## 2013-11-15 ENCOUNTER — Encounter (HOSPITAL_BASED_OUTPATIENT_CLINIC_OR_DEPARTMENT_OTHER)
Admission: RE | Admit: 2013-11-15 | Discharge: 2013-11-15 | Disposition: A | Discharge status: Home / Self Care | Payer: 59 | Source: Ambulatory Visit | Attending: Orthopedic Surgery | Admitting: Orthopedic Surgery

## 2013-11-15 ENCOUNTER — Other Ambulatory Visit: Payer: Self-pay

## 2013-11-15 LAB — BASIC METABOLIC PANEL
BUN: 15 mg/dL (ref 6–23)
CALCIUM: 9.2 mg/dL (ref 8.4–10.5)
CO2: 26 mEq/L (ref 19–32)
Chloride: 97 mEq/L (ref 96–112)
Creatinine, Ser: 0.71 mg/dL (ref 0.50–1.10)
GLUCOSE: 104 mg/dL — AB (ref 70–99)
POTASSIUM: 3.7 meq/L (ref 3.7–5.3)
Sodium: 137 mEq/L (ref 137–147)

## 2013-11-15 NOTE — Anesthesia Preprocedure Evaluation (Addendum)
Anesthesia Evaluation  Patient identified by MRN, date of birth, ID band  History of Anesthesia Complications (+) PONV  Airway Mallampati: II  Neck ROM: Full    Dental  (+) Teeth Intact   Pulmonary neg pulmonary ROS,  breath sounds clear to auscultation        Cardiovascular hypertension, + CAD, + Cardiac Stents and + Peripheral Vascular Disease Rhythm:Regular Rate:Normal     Neuro/Psych  Neuromuscular disease CVA    GI/Hepatic negative GI ROS, Neg liver ROS,   Endo/Other  Morbid obesity  Renal/GU negative Renal ROS     Musculoskeletal  (+) Arthritis -,   Abdominal (+) + obese,   Peds  Hematology   Anesthesia Other Findings Dr. Ron Parker saw her on 8/14 and felt she was fine to go to surgery from a cardiac standpoint.  See Chart Review and notes from his visit.  Reproductive/Obstetrics                         Anesthesia Physical Anesthesia Plan  ASA: III  Anesthesia Plan: General   Post-op Pain Management:    Induction: Intravenous  Airway Management Planned: Oral ETT  Additional Equipment:   Intra-op Plan:   Post-operative Plan: Extubation in OR  Informed Consent: I have reviewed the patients History and Physical, chart, labs and discussed the procedure including the risks, benefits and alternatives for the proposed anesthesia with the patient or authorized representative who has indicated his/her understanding and acceptance.   Dental advisory given  Plan Discussed with:   Anesthesia Plan Comments:         Anesthesia Quick Evaluation

## 2013-11-16 ENCOUNTER — Encounter (HOSPITAL_BASED_OUTPATIENT_CLINIC_OR_DEPARTMENT_OTHER): Payer: Self-pay | Admitting: Anesthesiology

## 2013-11-16 ENCOUNTER — Ambulatory Visit (HOSPITAL_BASED_OUTPATIENT_CLINIC_OR_DEPARTMENT_OTHER)
Admission: RE | Admit: 2013-11-16 | Discharge: 2013-11-17 | Disposition: A | Discharge status: Home / Self Care | Payer: 59 | Source: Ambulatory Visit | Attending: Orthopedic Surgery | Admitting: Orthopedic Surgery

## 2013-11-16 ENCOUNTER — Encounter (HOSPITAL_BASED_OUTPATIENT_CLINIC_OR_DEPARTMENT_OTHER)
Admission: RE | Disposition: A | Discharge status: Home / Self Care | Payer: Self-pay | Source: Ambulatory Visit | Attending: Orthopedic Surgery

## 2013-11-16 ENCOUNTER — Ambulatory Visit (HOSPITAL_BASED_OUTPATIENT_CLINIC_OR_DEPARTMENT_OTHER): Payer: 59 | Admitting: Anesthesiology

## 2013-11-16 ENCOUNTER — Encounter (HOSPITAL_BASED_OUTPATIENT_CLINIC_OR_DEPARTMENT_OTHER): Payer: 59 | Admitting: Anesthesiology

## 2013-11-16 DIAGNOSIS — E785 Hyperlipidemia, unspecified: Secondary | ICD-10-CM | POA: Insufficient documentation

## 2013-11-16 DIAGNOSIS — I2510 Atherosclerotic heart disease of native coronary artery without angina pectoris: Secondary | ICD-10-CM | POA: Insufficient documentation

## 2013-11-16 DIAGNOSIS — Z9861 Coronary angioplasty status: Secondary | ICD-10-CM | POA: Insufficient documentation

## 2013-11-16 DIAGNOSIS — M67919 Unspecified disorder of synovium and tendon, unspecified shoulder: Secondary | ICD-10-CM | POA: Insufficient documentation

## 2013-11-16 DIAGNOSIS — M069 Rheumatoid arthritis, unspecified: Secondary | ICD-10-CM | POA: Insufficient documentation

## 2013-11-16 DIAGNOSIS — Z79899 Other long term (current) drug therapy: Secondary | ICD-10-CM | POA: Insufficient documentation

## 2013-11-16 DIAGNOSIS — E663 Overweight: Secondary | ICD-10-CM | POA: Insufficient documentation

## 2013-11-16 DIAGNOSIS — M75101 Unspecified rotator cuff tear or rupture of right shoulder, not specified as traumatic: Secondary | ICD-10-CM | POA: Diagnosis present

## 2013-11-16 DIAGNOSIS — Z9884 Bariatric surgery status: Secondary | ICD-10-CM | POA: Insufficient documentation

## 2013-11-16 DIAGNOSIS — M719 Bursopathy, unspecified: Principal | ICD-10-CM | POA: Insufficient documentation

## 2013-11-16 DIAGNOSIS — G90519 Complex regional pain syndrome I of unspecified upper limb: Secondary | ICD-10-CM | POA: Insufficient documentation

## 2013-11-16 DIAGNOSIS — I658 Occlusion and stenosis of other precerebral arteries: Secondary | ICD-10-CM | POA: Insufficient documentation

## 2013-11-16 DIAGNOSIS — I10 Essential (primary) hypertension: Secondary | ICD-10-CM | POA: Insufficient documentation

## 2013-11-16 DIAGNOSIS — M75100 Unspecified rotator cuff tear or rupture of unspecified shoulder, not specified as traumatic: Secondary | ICD-10-CM | POA: Diagnosis present

## 2013-11-16 DIAGNOSIS — I6529 Occlusion and stenosis of unspecified carotid artery: Secondary | ICD-10-CM | POA: Insufficient documentation

## 2013-11-16 HISTORY — DX: Nausea with vomiting, unspecified: R11.2

## 2013-11-16 HISTORY — PX: SHOULDER ARTHROSCOPY WITH SUBACROMIAL DECOMPRESSION, ROTATOR CUFF REPAIR AND BICEP TENDON REPAIR: SHX5687

## 2013-11-16 HISTORY — DX: Nausea with vomiting, unspecified: Z98.890

## 2013-11-16 HISTORY — DX: Unspecified rotator cuff tear or rupture of right shoulder, not specified as traumatic: M75.101

## 2013-11-16 HISTORY — DX: Myoneural disorder, unspecified: G70.9

## 2013-11-16 LAB — POCT HEMOGLOBIN-HEMACUE: HEMOGLOBIN: 13 g/dL (ref 12.0–15.0)

## 2013-11-16 SURGERY — SHOULDER ARTHROSCOPY WITH SUBACROMIAL DECOMPRESSION, ROTATOR CUFF REPAIR AND BICEP TENDON REPAIR
Anesthesia: General | Site: Shoulder | Laterality: Right

## 2013-11-16 MED ORDER — METHOCARBAMOL 1000 MG/10ML IJ SOLN: 500.0000 mg | Freq: Four times a day (QID) | INTRAVENOUS | Status: DC | PRN

## 2013-11-16 MED ORDER — FENTANYL CITRATE 0.05 MG/ML IJ SOLN
INTRAMUSCULAR | Status: AC
  Filled 2013-11-16: qty 2

## 2013-11-16 MED ORDER — CEFAZOLIN SODIUM-DEXTROSE 2-3 GM-% IV SOLR
INTRAVENOUS | Status: AC
  Filled 2013-11-16: qty 50

## 2013-11-16 MED ORDER — METOCLOPRAMIDE HCL 5 MG/ML IJ SOLN: 5.0000 mg | Freq: Three times a day (TID) | INTRAMUSCULAR | Status: DC | PRN

## 2013-11-16 MED ORDER — MIDAZOLAM HCL 2 MG/2ML IJ SOLN
1.0000 mg | INTRAMUSCULAR | Status: DC | PRN
  Administered 2013-11-16: 2 mg via INTRAVENOUS

## 2013-11-16 MED ORDER — NITROGLYCERIN 0.4 MG SL SUBL: 0.4000 mg | SUBLINGUAL_TABLET | SUBLINGUAL | Status: DC | PRN

## 2013-11-16 MED ORDER — LORATADINE 10 MG PO TABS: 10.0000 mg | ORAL_TABLET | Freq: Every day | ORAL | Status: DC

## 2013-11-16 MED ORDER — METOPROLOL TARTRATE 25 MG PO TABS
25.0000 mg | ORAL_TABLET | Freq: Two times a day (BID) | ORAL | Status: DC
  Administered 2013-11-16: 25 mg via ORAL

## 2013-11-16 MED ORDER — PROMETHAZINE HCL 25 MG/ML IJ SOLN
INTRAMUSCULAR | Status: AC
  Filled 2013-11-16: qty 1

## 2013-11-16 MED ORDER — SENNA 8.6 MG PO TABS
1.0000 | ORAL_TABLET | Freq: Two times a day (BID) | ORAL | Status: DC
  Administered 2013-11-16: 8.6 mg via ORAL
  Filled 2013-11-16: qty 1

## 2013-11-16 MED ORDER — CEFAZOLIN SODIUM 1-5 GM-% IV SOLN
INTRAVENOUS | Status: AC
  Filled 2013-11-16: qty 50

## 2013-11-16 MED ORDER — ALLOPURINOL 100 MG PO TABS
100.0000 mg | ORAL_TABLET | Freq: Every day | ORAL | Status: DC
  Administered 2013-11-16: 100 mg via ORAL

## 2013-11-16 MED ORDER — ASPIRIN 81 MG PO TABS: 81.0000 mg | ORAL_TABLET | Freq: Every day | ORAL | Status: DC

## 2013-11-16 MED ORDER — METHOCARBAMOL 500 MG PO TABS
500.0000 mg | ORAL_TABLET | Freq: Four times a day (QID) | ORAL | Status: DC | PRN
  Administered 2013-11-16 – 2013-11-17 (×2): 500 mg via ORAL
  Filled 2013-11-16 (×2): qty 1

## 2013-11-16 MED ORDER — LIDOCAINE HCL (CARDIAC) 20 MG/ML IV SOLN
INTRAVENOUS | Status: DC | PRN
  Administered 2013-11-16: 80 mg via INTRAVENOUS

## 2013-11-16 MED ORDER — LACTATED RINGERS IV SOLN
INTRAVENOUS | Status: DC
  Administered 2013-11-16 (×2): via INTRAVENOUS

## 2013-11-16 MED ORDER — PROMETHAZINE HCL 25 MG/ML IJ SOLN
6.2500 mg | INTRAMUSCULAR | Status: DC | PRN
  Administered 2013-11-16: 6.25 mg via INTRAVENOUS

## 2013-11-16 MED ORDER — PREDNISONE 5 MG PO TABS: 5.0000 mg | ORAL_TABLET | Freq: Every day | ORAL | Status: DC

## 2013-11-16 MED ORDER — MIDAZOLAM HCL 2 MG/2ML IJ SOLN
INTRAMUSCULAR | Status: AC
  Filled 2013-11-16: qty 2

## 2013-11-16 MED ORDER — PROMETHAZINE HCL 25 MG PO TABS: 25.0000 mg | ORAL_TABLET | Freq: Four times a day (QID) | ORAL | Status: DC | PRN

## 2013-11-16 MED ORDER — OXYCODONE-ACETAMINOPHEN 10-325 MG PO TABS: 1.0000 | ORAL_TABLET | Freq: Four times a day (QID) | ORAL | Status: DC | PRN

## 2013-11-16 MED ORDER — LEFLUNOMIDE 20 MG PO TABS: 20.0000 mg | ORAL_TABLET | Freq: Every day | ORAL | Status: DC

## 2013-11-16 MED ORDER — OXYCODONE HCL 5 MG PO TABS: 5.0000 mg | ORAL_TABLET | ORAL | Status: DC | PRN

## 2013-11-16 MED ORDER — METOCLOPRAMIDE HCL 5 MG PO TABS: 5.0000 mg | ORAL_TABLET | Freq: Three times a day (TID) | ORAL | Status: DC | PRN

## 2013-11-16 MED ORDER — ONDANSETRON HCL 4 MG PO TABS: 4.0000 mg | ORAL_TABLET | Freq: Four times a day (QID) | ORAL | Status: DC | PRN

## 2013-11-16 MED ORDER — CEFAZOLIN SODIUM 1-5 GM-% IV SOLN
1.0000 g | Freq: Four times a day (QID) | INTRAVENOUS | Status: AC
  Administered 2013-11-16 – 2013-11-17 (×3): 1 g via INTRAVENOUS

## 2013-11-16 MED ORDER — ATORVASTATIN CALCIUM 40 MG PO TABS
40.0000 mg | ORAL_TABLET | Freq: Every day | ORAL | Status: DC
  Administered 2013-11-16: 40 mg via ORAL

## 2013-11-16 MED ORDER — SCOPOLAMINE 1 MG/3DAYS TD PT72
MEDICATED_PATCH | TRANSDERMAL | Status: AC
  Filled 2013-11-16: qty 1

## 2013-11-16 MED ORDER — RAMIPRIL 2.5 MG PO CAPS
2.5000 mg | ORAL_CAPSULE | Freq: Every day | ORAL | Status: DC
  Administered 2013-11-16: 2.5 mg via ORAL

## 2013-11-16 MED ORDER — ONDANSETRON HCL 4 MG/2ML IJ SOLN
INTRAMUSCULAR | Status: DC | PRN
  Administered 2013-11-16: 4 mg via INTRAVENOUS

## 2013-11-16 MED ORDER — FENTANYL CITRATE 0.05 MG/ML IJ SOLN
INTRAMUSCULAR | Status: DC | PRN
  Administered 2013-11-16 (×2): 25 ug via INTRAVENOUS

## 2013-11-16 MED ORDER — FENTANYL CITRATE 0.05 MG/ML IJ SOLN
50.0000 ug | INTRAMUSCULAR | Status: DC | PRN
  Administered 2013-11-16: 100 ug via INTRAVENOUS

## 2013-11-16 MED ORDER — HYDROMORPHONE HCL 2 MG PO TABS: 2.0000 mg | ORAL_TABLET | ORAL | Status: DC | PRN

## 2013-11-16 MED ORDER — SODIUM CHLORIDE 0.9 % IV SOLN
INTRAVENOUS | Status: DC
  Administered 2013-11-16: 15:00:00 via INTRAVENOUS

## 2013-11-16 MED ORDER — HYDROMORPHONE HCL 2 MG PO TABS
2.0000 mg | ORAL_TABLET | ORAL | Status: DC | PRN
  Administered 2013-11-16 – 2013-11-17 (×4): 2 mg via ORAL
  Filled 2013-11-16 (×4): qty 1

## 2013-11-16 MED ORDER — ONDANSETRON HCL 4 MG/2ML IJ SOLN: 4.0000 mg | Freq: Four times a day (QID) | INTRAMUSCULAR | Status: DC | PRN

## 2013-11-16 MED ORDER — SENNA-DOCUSATE SODIUM 8.6-50 MG PO TABS: 2.0000 | ORAL_TABLET | Freq: Every day | ORAL | Status: DC

## 2013-11-16 MED ORDER — HYDROMORPHONE HCL PF 1 MG/ML IJ SOLN
0.5000 mg | INTRAMUSCULAR | Status: DC | PRN
  Administered 2013-11-17: 1 mg via INTRAVENOUS
  Filled 2013-11-16: qty 1

## 2013-11-16 MED ORDER — DOCUSATE SODIUM 100 MG PO CAPS
100.0000 mg | ORAL_CAPSULE | Freq: Two times a day (BID) | ORAL | Status: DC
  Administered 2013-11-16: 100 mg via ORAL
  Filled 2013-11-16: qty 1

## 2013-11-16 MED ORDER — OXYCODONE-ACETAMINOPHEN 5-325 MG PO TABS: 1.0000 | ORAL_TABLET | ORAL | Status: DC | PRN

## 2013-11-16 MED ORDER — PROPOFOL 10 MG/ML IV BOLUS
INTRAVENOUS | Status: DC | PRN
  Administered 2013-11-16: 150 mg via INTRAVENOUS

## 2013-11-16 MED ORDER — HYDROMORPHONE HCL PF 1 MG/ML IJ SOLN
0.2500 mg | INTRAMUSCULAR | Status: DC | PRN
  Administered 2013-11-16: 0.25 mg via INTRAVENOUS

## 2013-11-16 MED ORDER — FUROSEMIDE 40 MG PO TABS: 40.0000 mg | ORAL_TABLET | Freq: Every day | ORAL | Status: DC

## 2013-11-16 MED ORDER — TOFACITINIB CITRATE 5 MG PO TABS: 2.0000 | ORAL_TABLET | Freq: Every morning | ORAL | Status: DC

## 2013-11-16 MED ORDER — SUCCINYLCHOLINE CHLORIDE 20 MG/ML IJ SOLN
INTRAMUSCULAR | Status: DC | PRN
  Administered 2013-11-16: 100 mg via INTRAVENOUS

## 2013-11-16 MED ORDER — DEXAMETHASONE SODIUM PHOSPHATE 4 MG/ML IJ SOLN
INTRAMUSCULAR | Status: DC | PRN
  Administered 2013-11-16: 10 mg via INTRAVENOUS

## 2013-11-16 MED ORDER — TRAMADOL HCL 50 MG PO TABS
50.0000 mg | ORAL_TABLET | Freq: Four times a day (QID) | ORAL | Status: DC | PRN
Start: 2013-11-16 — End: 2013-11-17

## 2013-11-16 MED ORDER — SODIUM CHLORIDE 0.9 % IR SOLN
Status: DC | PRN
  Administered 2013-11-16: 21000 mL

## 2013-11-16 MED ORDER — METHOCARBAMOL 500 MG PO TABS: 500.0000 mg | ORAL_TABLET | Freq: Four times a day (QID) | ORAL | Status: DC

## 2013-11-16 MED ORDER — CEFAZOLIN SODIUM-DEXTROSE 2-3 GM-% IV SOLR
2.0000 g | INTRAVENOUS | Status: AC
  Administered 2013-11-16: 2 g via INTRAVENOUS

## 2013-11-16 MED ORDER — SCOPOLAMINE 1 MG/3DAYS TD PT72
1.0000 | MEDICATED_PATCH | TRANSDERMAL | Status: DC
  Administered 2013-11-16: 1.5 mg via TRANSDERMAL

## 2013-11-16 MED ORDER — HYDROMORPHONE HCL PF 1 MG/ML IJ SOLN
INTRAMUSCULAR | Status: AC
  Filled 2013-11-16: qty 1

## 2013-11-16 MED ORDER — CLOPIDOGREL BISULFATE 75 MG PO TABS: 75.0000 mg | ORAL_TABLET | Freq: Every day | ORAL | Status: DC

## 2013-11-16 SURGICAL SUPPLY — 75 items
ANCH SUT SWLK 19.1X4.75 (Anchor) ×2 IMPLANT
ANCHOR SUT BIO SW 4.75X19.1 (Anchor) ×4 IMPLANT
APL SKNCLS STERI-STRIP NONHPOA (GAUZE/BANDAGES/DRESSINGS) ×1
BENZOIN TINCTURE PRP APPL 2/3 (GAUZE/BANDAGES/DRESSINGS) ×3 IMPLANT
BLADE 15 SAFETY STRL DISP (BLADE) IMPLANT
BLADE CUTTER GATOR 3.5 (BLADE) ×3 IMPLANT
BLADE GREAT WHITE 4.2 (BLADE) IMPLANT
BLADE GREAT WHITE 4.2MM (BLADE)
BUR OVAL 4.0 (BURR) IMPLANT
BUR OVAL 6.0 (BURR) ×2 IMPLANT
CANNULA 5.75X71 LONG (CANNULA) ×3 IMPLANT
CANNULA TWIST IN 8.25X7CM (CANNULA) ×4 IMPLANT
CLOSURE WOUND 1/2 X4 (GAUZE/BANDAGES/DRESSINGS) ×1
DECANTER SPIKE VIAL GLASS SM (MISCELLANEOUS) IMPLANT
DRAPE INCISE IOBAN 66X45 STRL (DRAPES) ×3 IMPLANT
DRAPE SHOULDER BEACH CHAIR (DRAPES) ×3 IMPLANT
DRAPE U 20/CS (DRAPES) ×3 IMPLANT
DRAPE U-SHAPE 47X51 STRL (DRAPES) ×3 IMPLANT
DRSG PAD ABDOMINAL 8X10 ST (GAUZE/BANDAGES/DRESSINGS) ×3 IMPLANT
DURAPREP 26ML APPLICATOR (WOUND CARE) ×5 IMPLANT
ELECT REM PT RETURN 9FT ADLT (ELECTROSURGICAL) ×3
ELECTRODE REM PT RTRN 9FT ADLT (ELECTROSURGICAL) ×1 IMPLANT
FIBERSTICK 2 (SUTURE) IMPLANT
GAUZE SPONGE 4X4 12PLY STRL (GAUZE/BANDAGES/DRESSINGS) ×3 IMPLANT
GLOVE BIO SURGEON STRL SZ 6.5 (GLOVE) ×1 IMPLANT
GLOVE BIO SURGEON STRL SZ8 (GLOVE) ×3 IMPLANT
GLOVE BIO SURGEONS STRL SZ 6.5 (GLOVE) ×1
GLOVE BIOGEL PI IND STRL 7.0 (GLOVE) IMPLANT
GLOVE BIOGEL PI IND STRL 8 (GLOVE) ×2 IMPLANT
GLOVE BIOGEL PI INDICATOR 7.0 (GLOVE) ×2
GLOVE BIOGEL PI INDICATOR 8 (GLOVE) ×6
GLOVE EXAM NITRILE LRG STRL (GLOVE) ×2 IMPLANT
GLOVE ORTHO TXT STRL SZ7.5 (GLOVE) ×5 IMPLANT
GOWN STRL REUS W/ TWL LRG LVL3 (GOWN DISPOSABLE) ×1 IMPLANT
GOWN STRL REUS W/ TWL XL LVL3 (GOWN DISPOSABLE) ×2 IMPLANT
GOWN STRL REUS W/TWL LRG LVL3 (GOWN DISPOSABLE) ×3
GOWN STRL REUS W/TWL XL LVL3 (GOWN DISPOSABLE) ×9
IMMOBILIZER SHOULDER FOAM XLGE (SOFTGOODS) IMPLANT
IV NS 1000ML (IV SOLUTION) ×6
IV NS 1000ML BAXH (IV SOLUTION) IMPLANT
IV NS IRRIG 3000ML ARTHROMATIC (IV SOLUTION) ×4 IMPLANT
IV SOD CHL 0.9% 1000ML (IV SOLUTION) ×18 IMPLANT
KIT SHOULDER TRACTION (DRAPES) ×3 IMPLANT
LASSO 90 CVE QUICKPAS (DISPOSABLE) ×2 IMPLANT
MANIFOLD NEPTUNE II (INSTRUMENTS) ×3 IMPLANT
NDL SCORPION MULTI FIRE (NEEDLE) ×1 IMPLANT
NEEDLE SCORPION MULTI FIRE (NEEDLE) ×3 IMPLANT
PACK ARTHROSCOPY DSU (CUSTOM PROCEDURE TRAY) ×3 IMPLANT
PACK BASIN DAY SURGERY FS (CUSTOM PROCEDURE TRAY) ×3 IMPLANT
SET ARTHROSCOPY TUBING (MISCELLANEOUS) ×3
SET ARTHROSCOPY TUBING LN (MISCELLANEOUS) ×1 IMPLANT
SHEET MEDIUM DRAPE 40X70 STRL (DRAPES) ×3 IMPLANT
SLEEVE SCD COMPRESS KNEE MED (MISCELLANEOUS) ×3 IMPLANT
SLING ARM IMMOBILIZER LRG (SOFTGOODS) IMPLANT
SLING ARM IMMOBILIZER MED (SOFTGOODS) IMPLANT
SLING ARM LRG ADULT FOAM STRAP (SOFTGOODS) IMPLANT
SLING ARM MED ADULT FOAM STRAP (SOFTGOODS) IMPLANT
SLING ARM XL FOAM STRAP (SOFTGOODS) IMPLANT
STRIP CLOSURE SKIN 1/2X4 (GAUZE/BANDAGES/DRESSINGS) ×2 IMPLANT
SUT FIBERWIRE #2 38 T-5 BLUE (SUTURE)
SUT MNCRL AB 4-0 PS2 18 (SUTURE) IMPLANT
SUT PDS AB 0 CT 36 (SUTURE) ×1 IMPLANT
SUT PDS AB 1 CT  36 (SUTURE)
SUT PDS AB 1 CT 36 (SUTURE) IMPLANT
SUT TIGER TAPE 7 IN WHITE (SUTURE) IMPLANT
SUT VIC AB 3-0 SH 27 (SUTURE)
SUT VIC AB 3-0 SH 27X BRD (SUTURE) IMPLANT
SUTURE FIBERWR #2 38 T-5 BLUE (SUTURE) IMPLANT
TAPE FIBER 2MM 7IN #2 BLUE (SUTURE) ×4 IMPLANT
TOWEL OR 17X24 6PK STRL BLUE (TOWEL DISPOSABLE) ×3 IMPLANT
TOWEL OR NON WOVEN STRL DISP B (DISPOSABLE) ×3 IMPLANT
TUBE CONNECTING 20'X1/4 (TUBING) ×1
TUBE CONNECTING 20X1/4 (TUBING) ×2 IMPLANT
WAND STAR VAC 90 (SURGICAL WAND) ×3 IMPLANT
WATER STERILE IRR 1000ML POUR (IV SOLUTION) ×3 IMPLANT

## 2013-11-16 NOTE — Anesthesia Procedure Notes (Addendum)
Anesthesia Regional Block:  Supraclavicular block  Pre-Anesthetic Checklist: ,, timeout performed, Correct Patient, Correct Site, Correct Laterality, Correct Procedure, Correct Position, site marked, Risks and benefits discussed,  Surgical consent,  Pre-op evaluation,  At surgeon's request and post-op pain management  Laterality: Right  Prep: chloraprep       Needles:   Needle Type: Echogenic Needle     Needle Length: 9cm 9 cm Needle Gauge: 22 and 22 G  Needle insertion depth: 5 cm   Additional Needles:  Procedures: ultrasound guided (picture in chart) Supraclavicular block Narrative:  Start time: 11/16/2013 7:40 AM End time: 11/16/2013 7:55 AM Injection made incrementally with aspirations every 5 mL.  Performed by: Personally  Anesthesiologist: T Massagee  Additional Notes: Tolerated well   Procedure Name: Intubation Date/Time: 11/16/2013 8:04 AM Performed by: Maryella Shivers Pre-anesthesia Checklist: Patient identified, Emergency Drugs available, Suction available and Patient being monitored Patient Re-evaluated:Patient Re-evaluated prior to inductionOxygen Delivery Method: Circle System Utilized Preoxygenation: Pre-oxygenation with 100% oxygen Intubation Type: IV induction Ventilation: Mask ventilation without difficulty Laryngoscope Size: Mac and 3 Grade View: Grade II Tube type: Oral Number of attempts: 1 Airway Equipment and Method: stylet and oral airway Placement Confirmation: ETT inserted through vocal cords under direct vision,  positive ETCO2 and breath sounds checked- equal and bilateral Secured at: 21 cm Tube secured with: Tape Dental Injury: Teeth and Oropharynx as per pre-operative assessment

## 2013-11-16 NOTE — Op Note (Signed)
11/16/2013  10:15 AM  PATIENT:  Cindy Holmes    PRE-OPERATIVE DIAGNOSIS:  Right shoulder large rotator cuff tear, impingement syndrome, a.c. joint arthrosis, bicipital tendinitis  POST-OPERATIVE DIAGNOSIS:  Same, with subscapularis tear  PROCEDURE:  Right shoulder arthroscopy with extensive debridement, biceps tendon release, subscapularis repair, supraspinatus repair, acromioplasty, distal clavicle resection.  SURGEON:  [name], MD  PHYSICIAN ASSISTANT: Joya Gaskins, OPA-C, present and scrubbed throughout the case, critical for completion in a timely fashion, and for retraction, instrumentation, and closure.  ANESTHESIA:   General with regional block  PREOPERATIVE INDICATIONS:  Cindy Holmes is a  63 y.o. female who had severe chronic shoulder pain that failed conservative measures and elected for surgical management.    The risks benefits and alternatives were discussed with the patient preoperatively including but not limited to the risks of infection, bleeding, nerve injury, cardiopulmonary complications, the need for revision surgery, among others, and the patient was willing to proceed. We also discussed the risks for incomplete relief of pain, biceps Popeye sign, recurrent rotator cuff tear, among others.  OPERATIVE IMPLANTS: Arthrex bio composite 4.75 mm swivel lock x2, one for the subscapularis and 1 for the supraspinatus. I used in inverted fiber tape for the subscapularis, and an inverted fiber tape and an inverted FiberWire for the supraspinatus.  OPERATIVE FINDINGS: The articular cartilage of the glenohumeral joint was intact. The biceps tendon had over 50% fraying. The subscapularis was also torn with a characteristic V. disruption where the biceps tendon was probably somewhat unstable. There was severe tendinopathy. The supraspinatus tendon quality was mediocre at best. The bone quality was also mediocre. The infraspinatus was intact, and there was substantial  subacromial spurring as well as a.c. joint degenerative changes.  OPERATIVE PROCEDURE: Patient was brought to the operating room and placed in supine position. General anesthesia was administered. IV antibiotics in the form of Ancef were given and she tolerated this well. She was turned into the semilateral decubitus position, and the right upper tremulous examined and had full motion. She was then prepped and draped in usual sterile fashion, time out performed. Diagnostic arthroscopy carried out the above-named findings. I used the arthroscopic biter to release the biceps tendon. I debrided the M.D. insertional point for the subscapularis, and then placed a total of 2 anterior cannulas, and used the curved suture lasso to pass an inverted fiber tape through the subscapularis. This restored subscapularis tension, and I also did perform a light abrasion of the tuberosity in order to optimize healing.  I then went to the subacromial space, performed a bursectomy, CA ligament release, acromioplasty, and tubercleplasty irregular portions of the exposed tuberosity, and preparing the tuberosity for implantation of the cuff. I placed an inverted FiberWire posteriorly using a [employer]ak suture passer through a complex deep delaminated posterior rotator cuff tear, and then placed an inverted fiber tape anteriorly. I then had excellent hold of the tissue, and good mobility, and anchored this into a lateral 4.75 mm bio composite swivel lock.  I then resected the distal clavicle, removing approximately 1 cm of the bone, and confirmed the satisfactory  resection on multiple views.the instruments were then removed, the portals closed with Monocryl followed by Steri-Strips and sterile gauze, and the patient was awakened and returned to PACU in stable and satisfactory condition. There were no complications.

## 2013-11-16 NOTE — Progress Notes (Signed)
Assisted Dr. Massagee with right, ultrasound guided, interscalene  block. Side rails up, monitors on throughout procedure. See vital signs in flow sheet. Tolerated Procedure well. 

## 2013-11-16 NOTE — H&P (Signed)
PREOPERATIVE H&P  Chief Complaint: Right shoulder pain  HPI: Cindy Holmes is a 63 y.o. female who presents for preoperative history and physical with a diagnosis of right shoulder: disorder rotator cuff tear are rated as moderate to severe, and have been worsening.  This is significantly impairing activities of daily living.  She has elected for surgical management. She has failed prednisone, injections, activity modification, cannot tolerate anti-inflammatories due to a previous gastric bypass surgery. She complains of persistent weakness and loss of function.  Past Medical History  Diagnosis Date  . CAD (coronary artery disease)     PTCA LAD 2001/cath 2002-prox and mid LAD stents patent, PTCA ostium Dx 1/ nuclear 7/09. resting ST's become worse, no scar or ischemia. nuclear 11/18/09, ST changes with walking, no chest pain, anterior breat attenuation with no ischemia  . Overweight     laproscopic adjustable gastric banding with APS standard system  . HTN (hypertension)   . Edema   . Hyperlipidemia   . Stroke     mini stroke in past ???  . Carotid artery disease     Doppler September, 2011, 0-39% bilateral disease mild  . Emotional stress reaction     8/11  . Leg pain     July, 2012, Arterial Dopplers March 08, 2011 normal  . Rheumatoid arthritis   . 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 PONV (postoperative nausea and vomiting)    Past Surgical History  Procedure Laterality Date  . Tubal ligation    . Cholecystectomy    . Cardiac catheterization      with stent placement in 2001  . Laproscopic adjustable gastric  banding      with APS standard system.   . Coronary angioplasty      2002  . Pt has 3 stents      sept 2001   History   Social History  . Marital Status: Married    Spouse Name: N/A    Number of Children: N/A  . Years of Education: N/A   Social History Main Topics  . Smoking status: Never Smoker   . Smokeless tobacco: None  .  Alcohol Use: No  . Drug Use: No  . Sexual Activity: No   Other Topics Concern  . None   Social History Narrative  . None   Family History  Problem Relation Age of Onset  . Heart attack Mother   . Cancer      family history  . Diabetes Brother   . Diabetes Sister   . Heart attack      family history   Allergies  Allergen Reactions  . Codeine Phosphate Shortness Of Breath    REACTION: unspecified  . Amoxicillin Nausea And Vomiting    REACTION: unspecified  . Penicillins Nausea And Vomiting  . Lidocaine Rash    Patch caused rash   Prior to Admission medications   Medication Sig Start Date End Date Taking? Authorizing Provider  allopurinol (ZYLOPRIM) 100 MG tablet Take 100 mg by mouth daily.   Yes Historical Provider, MD  aspirin 81 MG tablet Take 81 mg by mouth daily.     Yes Historical Provider, MD  atorvastatin (LIPITOR) 40 MG tablet Take 1 tablet (40 mg total) by mouth daily. 03/08/13  Yes Carlena Bjornstad, MD  clopidogrel (PLAVIX) 75 MG tablet Take 1 tablet (75 mg total) by mouth daily. 03/08/13  Yes Carlena Bjornstad, MD  desloratadine (CLARINEX) 5 MG  tablet Take 5 mg by mouth daily.   Yes Historical Provider, MD  furosemide (LASIX) 40 MG tablet Take 1 tablet (40 mg total) by mouth daily. 03/08/13  Yes Carlena Bjornstad, MD  leflunomide (ARAVA) 20 MG tablet Take 20 mg by mouth daily.   Yes Historical Provider, MD  metoprolol (LOPRESSOR) 50 MG tablet take 1 tablet by mouth once daily 03/08/13  Yes Carlena Bjornstad, MD  Omega-3 Fatty Acids (FISH OIL) 1000 MG CAPS Take by mouth daily.     Yes Historical Provider, MD  predniSONE (DELTASONE) 5 MG tablet Take 5 mg by mouth daily with breakfast. Takes 2 tabs daily   Yes Historical Provider, MD  ramipril (ALTACE) 2.5 MG capsule Take 1 capsule (2.5 mg total) by mouth daily. 03/08/13  Yes Carlena Bjornstad, MD  Tofacitinib Citrate Morrie Sheldon) 5 MG TABS Take 2 tablets by mouth.   Yes Historical Provider, MD  traMADol (ULTRAM) 50 MG tablet Take 50 mg  by mouth every 6 (six) hours as needed.   Yes Historical Provider, MD  nitroGLYCERIN (NITROSTAT) 0.4 MG SL tablet Place 1 tablet (0.4 mg total) under the tongue every 5 (five) minutes as needed for chest pain. 03/08/13   Carlena Bjornstad, MD     Positive ROS: All other systems have been reviewed and were otherwise negative with the exception of those mentioned in the HPI and as above.  Physical Exam: General: Alert, no acute distress Cardiovascular: No pedal edema Respiratory: No cyanosis, no use of accessory musculature GI: No organomegaly, abdomen is soft and non-tender Skin: No lesions in the area of chief complaint Neurologic: Sensation intact distally Psychiatric: Patient is competent for consent with normal mood and affect Lymphatic: No axillary or cervical lymphadenopathy  MUSCULOSKELETAL: Right shoulder active range of motion is 0 to her and 65, but she has a positive drop arm sign, positive pain over the a.c. joint, no pain over the biceps.  Assessment:  right shoulder large retracted rotator cuff tear, pain over the a.c. Joint, probable biceps tendinosis, history of gastric bypass and cardiac stents and diabetes. Her  Plan: Plan for Procedure(s): RIGHT SHOULDER ARTHROSCOPY, DEBRIDEMENT EXTENSIVE, DISTAL CLAVICLECTOMY WITH SUBACROMIAL PARTIAL ACROMIOPLASTY WITH CORACOACROMIAL RELEASE, ROTATOR CUFF REPAIR versus debridement  The risks benefits and alternatives were discussed with the patient including but not limited to the risks of nonoperative treatment, versus surgical intervention including infection, bleeding, nerve injury,  blood clots, cardiopulmonary complications, morbidity, mortality, among others, and they were willing to proceed.   [name], MD Cell [phone]   11/16/2013 7:20 AM

## 2013-11-16 NOTE — Discharge Instructions (Signed)
Diet: As you were doing prior to hospitalization   Shower:  May shower but keep the wounds dry, use an occlusive plastic wrap, NO SOAKING IN TUB.  If the bandage gets wet, change with a clean dry gauze.  Dressing:  You may change your dressing 3-5 days after surgery.  Then change the dressing daily with sterile gauze dressing.    There are sticky tapes (steri-strips) on your wounds and all the stitches are absorbable.  Leave the steri-strips in place when changing your dressings, they will peel off with time, usually 2-3 weeks.  Activity:  Increase activity slowly as tolerated, but follow the weight bearing instructions below.  No lifting or driving for 6 weeks.  Weight Bearing:   Sling at all times except for hygiene. .    To prevent constipation: you may use a stool softener such as -  Colace (over the counter) 100 mg by mouth twice a day  Drink plenty of fluids (prune juice may be helpful) and high fiber foods Miralax (over the counter) for constipation as needed.    Itching:  If you experience itching with your medications, try taking only a single pain pill, or even half a pain pill at a time.  You may take up to 10 pain pills per day, and you can also use benadryl over the counter for itching or also to help with sleep.   Precautions:  If you experience chest pain or shortness of breath - call 911 immediately for transfer to the hospital emergency department!!  If you develop a fever greater that 101 F, purulent drainage from wound, increased redness or drainage from wound, or calf pain -- Call the office at [phone]                                                Follow- Up Appointment:  Please call for an appointment to be seen in 2 weeks [place] - [phone]     Regional Anesthesia Blocks  1. Numbness or the inability to move the "blocked" extremity may last from 3-48 hours after placement. The length of time depends on the medication injected and your individual  response to the medication. If the numbness is not going away after 48 hours, call your surgeon.  2. The extremity that is blocked will need to be protected until the numbness is gone and the  Strength has returned. Because you cannot feel it, you will need to take extra care to avoid injury. Because it may be weak, you may have difficulty moving it or using it. You may not know what position it is in without looking at it while the block is in effect.  3. For blocks in the legs and feet, returning to weight bearing and walking needs to be done carefully. You will need to wait until the numbness is entirely gone and the strength has returned. You should be able to move your leg and foot normally before you try and bear weight or walk. You will need someone to be with you when you first try to ensure you do not fall and possibly risk injury.  4. Bruising and tenderness at the needle site are common side effects and will resolve in a few days.  5. Persistent numbness or new problems with movement should be communicated to the surgeon or the [facility] [phone] [place] [phone]).   Post Anesthesia Home Care Instructions  Activity: Get plenty of rest for the remainder of the day. A responsible adult should stay with you for 24 hours following the procedure.  For the next 24 hours, DO NOT: -Drive a car -Paediatric nurse -Drink alcoholic beverages -Take any medication unless instructed by your physician -Make any legal decisions or sign important papers.  Meals: Start with liquid foods such as gelatin or soup. Progress to regular foods as tolerated. Avoid greasy, spicy, heavy foods. If nausea and/or vomiting occur, drink only clear liquids until the nausea and/or vomiting subsides. Call your physician if vomiting continues.  Special Instructions/Symptoms: Your throat may feel dry or sore from the anesthesia or the breathing tube placed in your throat  during surgery. If this causes discomfort, gargle with warm salt water. The discomfort should disappear within 24 hours.

## 2013-11-16 NOTE — Transfer of Care (Signed)
Immediate Anesthesia Transfer of Care Note  Patient: Cindy Holmes  Procedure(s) Performed: Procedure(s): RIGHT SHOULDER ARTHROSCOPY, DEBRIROTATOR CUFF REPAIR  (Right)  Patient Location: PACU  Anesthesia Type:GA combined with regional for post-op pain  Level of Consciousness: awake, alert  and oriented  Airway & Oxygen Therapy: Patient Spontanous Breathing and Patient connected to face mask oxygen  Post-op Assessment: Report given to PACU RN and Post -op Vital signs reviewed and stable  Post vital signs: Reviewed and stable  Complications: No apparent anesthesia complications

## 2013-11-19 ENCOUNTER — Other Ambulatory Visit: Payer: Self-pay

## 2013-11-19 DIAGNOSIS — E785 Hyperlipidemia, unspecified: Secondary | ICD-10-CM

## 2013-11-19 MED ORDER — ATORVASTATIN CALCIUM 80 MG PO TABS: 80.0000 mg | ORAL_TABLET | Freq: Every day | ORAL | Status: DC

## 2013-11-20 ENCOUNTER — Encounter (HOSPITAL_BASED_OUTPATIENT_CLINIC_OR_DEPARTMENT_OTHER): Payer: Self-pay | Admitting: Orthopedic Surgery

## 2013-11-20 NOTE — Anesthesia Postprocedure Evaluation (Signed)
  Anesthesia Post-op Note  Patient: Cindy Holmes  Procedure(s) Performed: Procedure(s): RIGHT SHOULDER ARTHROSCOPY, EXTENSIVE DEBRIDEMENT, ROTATOR CUFF REPAIR  (Right)  Patient Location: PACU  Anesthesia Type:GA combined with regional for post-op pain  Level of Consciousness: awake and alert   Airway and Oxygen Therapy: Patient Spontanous Breathing  Post-op Pain: none  Post-op Assessment: Post-op Vital signs reviewed  Post-op Vital Signs: stable  Last Vitals:  Filed Vitals:   11/17/13 0700  BP: 112/64  Pulse: 69  Temp: 36.7 C  Resp: 18    Complications: No apparent anesthesia complications

## 2013-11-23 ENCOUNTER — Telehealth: Payer: Self-pay

## 2013-11-23 NOTE — Telephone Encounter (Signed)
The pts husband called me back this morning and stated that the pt is still feeling bad since her shoulder surgery last week. He is advised that Per Dr Ron Parker the pts needs to increase her dose of Lipitor to 80 mg daily and to repeat lab work in 6 weeks. He verbalized understanding and scheduled her lab to be drawn on June 6 and he states that he has already picked up her RX for Lipitor 80 mg.

## 2013-12-26 ENCOUNTER — Other Ambulatory Visit (INDEPENDENT_AMBULATORY_CARE_PROVIDER_SITE_OTHER): Payer: 59

## 2013-12-26 DIAGNOSIS — E785 Hyperlipidemia, unspecified: Secondary | ICD-10-CM

## 2013-12-26 LAB — LIPID PANEL
CHOLESTEROL: 197 mg/dL (ref 0–200)
HDL: 89.7 mg/dL (ref >39.00)
LDL Cholesterol: 71 mg/dL (ref 0–99)
NonHDL: 107.3
TRIGLYCERIDES: 181 mg/dL — AB (ref 0.0–149.0)
Total CHOL/HDL Ratio: 2
VLDL: 36.2 mg/dL (ref 0.0–40.0)

## 2013-12-28 ENCOUNTER — Telehealth: Payer: Self-pay | Admitting: Cardiology

## 2013-12-28 NOTE — Telephone Encounter (Signed)
Notified of lab results.  Routed to Dr. Ron Parker with her concerns.

## 2013-12-28 NOTE — Telephone Encounter (Signed)
Unable to leave message-no VM.

## 2013-12-28 NOTE — Telephone Encounter (Signed)
New message ° ° ° ° °Want test results °

## 2014-01-02 ENCOUNTER — Other Ambulatory Visit: Payer: Self-pay

## 2014-01-02 MED ORDER — ATORVASTATIN CALCIUM 40 MG PO TABS: 40.0000 mg | ORAL_TABLET | Freq: Every day | ORAL | Status: DC

## 2014-01-04 ENCOUNTER — Telehealth: Payer: Self-pay | Admitting: Cardiology

## 2014-01-04 ENCOUNTER — Other Ambulatory Visit: Payer: 59

## 2014-01-04 NOTE — Telephone Encounter (Addendum)
Most recent lab results have been faxed to Dr Micheline Rough at [phone]. The pt is aware.

## 2014-01-04 NOTE — Telephone Encounter (Signed)
New message    Need recent lab work fax # [phone] attention Dr. Micheline Rough @ [facility] in [place].

## 2014-01-07 ENCOUNTER — Telehealth (HOSPITAL_COMMUNITY): Payer: Self-pay

## 2014-01-07 ENCOUNTER — Other Ambulatory Visit: Payer: 59

## 2014-01-08 ENCOUNTER — Ambulatory Visit (HOSPITAL_COMMUNITY): Payer: 59

## 2014-01-16 ENCOUNTER — Ambulatory Visit (HOSPITAL_COMMUNITY)
Admission: RE | Admit: 2014-01-16 | Discharge: 2014-01-16 | Disposition: A | Discharge status: Home / Self Care | Payer: 59 | Source: Ambulatory Visit | Attending: Orthopedic Surgery | Admitting: Orthopedic Surgery

## 2014-01-16 DIAGNOSIS — G8929 Other chronic pain: Secondary | ICD-10-CM | POA: Insufficient documentation

## 2014-01-16 DIAGNOSIS — IMO0001 Reserved for inherently not codable concepts without codable children: Secondary | ICD-10-CM | POA: Diagnosis present

## 2014-01-16 DIAGNOSIS — M6281 Muscle weakness (generalized): Secondary | ICD-10-CM | POA: Insufficient documentation

## 2014-01-16 DIAGNOSIS — M25511 Pain in right shoulder: Secondary | ICD-10-CM

## 2014-01-16 DIAGNOSIS — I10 Essential (primary) hypertension: Secondary | ICD-10-CM | POA: Diagnosis not present

## 2014-01-16 DIAGNOSIS — M25519 Pain in unspecified shoulder: Secondary | ICD-10-CM | POA: Diagnosis not present

## 2014-01-16 DIAGNOSIS — M25611 Stiffness of right shoulder, not elsewhere classified: Secondary | ICD-10-CM

## 2014-01-16 NOTE — Evaluation (Signed)
Occupational Therapy Evaluation  Patient Details  Name: Cindy Holmes MRN: [id number] Date of Birth: [birth year]  Today's Date: 01/16/2014 Time: [phone] OT Time Calculation (min): 55 min OT eval [phone] 54'  Visit#: 1 of 16  Re-eval: 02/13/14  Assessment Diagnosis: Right shoulder rotator cuff tear debridement Surgical Date: 11/16/13 Prior Therapy: None    Past Medical History:  Past Medical History  Diagnosis Date  . CAD (coronary artery disease)     PTCA LAD 2001/cath 2002-prox and mid LAD stents patent, PTCA ostium Dx 1/ nuclear 7/09. resting ST's become worse, no scar or ischemia. nuclear 11/18/09, ST changes with walking, no chest pain, anterior breat attenuation with no ischemia  . Overweight     laproscopic adjustable gastric banding with APS standard system  . HTN (hypertension)   . Edema   . Hyperlipidemia   . Stroke     mini stroke in past ???  . Carotid artery disease     Doppler September, 2011, 0-39% bilateral disease mild  . Emotional stress reaction     8/11  . Leg pain     July, 2012, Arterial Dopplers March 08, 2011 normal  . Rheumatoid arthritis   . 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 PONV (postoperative nausea and vomiting)   . Right rotator cuff tear 11/16/2013   Past Surgical History:  Past Surgical History  Procedure Laterality Date  . Tubal ligation    . Cholecystectomy    . Cardiac catheterization      with stent placement in 2001  . Laproscopic adjustable gastric  banding      with APS standard system.   . Coronary angioplasty      2002  . Pt has 3 stents      sept 2001  . Shoulder arthroscopy with subacromial decompression, rotator cuff repair and bicep tendon repair Right 11/16/2013    Procedure: RIGHT SHOULDER ARTHROSCOPY, EXTENSIVE DEBRIDEMENT, ROTATOR CUFF REPAIR ;  Surgeon: [name], MD;  Location: [place];  Service: Orthopedics;  Laterality: Right;     Subjective Symptoms/Limitations Symptoms: S: My Doctor doesn't think therapy will help at all. He doesn't expect me to make any improvement but I wanted to try it.  Pertinent History: Patient presents with a right massive rotator cuff tear believed to be caused from an old worker's comp incident from approximately 5 years ago. Patient has a history of 5-10 years of chronic pain in the right shoulder. Patient had sx on 11/16/13 to repair subscapularis and supraspinatus as well as a bicep rupture. Rotator cuff tear is irrepairable. Patient has a history of gout and RA. Patient has not been using RUE for daily tasks due to pain which now has caused increased fascial restrictions and decreased strength and joint mobility. Patient states thatMD is not very optimistic about therapy outcome. Patient is motivated to try therapy for 4 weeks in hopes of regaining some movement and decrease pain.  Limitations: Using right arm as dominant arm for daily tasks Special Tests: FOTO score: 25/100 Patient Stated Goals: To decrease pain and gain some mobility in right arm.  Pain Assessment Currently in Pain?: Yes Pain Score: 10-Worst pain ever Pain Location: Shoulder Pain Orientation: Right Pain Type: Chronic pain Pain Radiating Towards: from neck to shoulder down to forearm Pain Onset: More than a month ago Pain Frequency: Constant Pain Relieving Factors: tylenol  Precautions/Restrictions  Precautions Precautions: None Restrictions Weight Bearing Restrictions: No  Balance Screening  Balance Screen Has the patient fallen in the past 6 months: No  Prior [place] expects to be discharged to:: Private residence Living Arrangements: Spouse/significant other Prior Function Level of Independence: Independent with basic ADLs;Independent with gait  Able to Take Stairs?: Yes Driving: Yes Vocation: Unemployed  Assessment ADL/Vision/Perception ADL ADL Comments: Patient is  unable to complete most daily tasks using RUE such as raising arm to shoulder height or above, dressing, bathing, rolling hair, and hlding a cup with a beverage. Dominant Hand: Right Vision - History Baseline Vision: No visual deficits  Cognition/Observation Cognition Overall Cognitive Status: Within Functional Limits for tasks assessed Arousal/Alertness: Awake/alert Orientation Level: Oriented X4 Observation/Other Assessments Observations: Pt presents with increased defensiveness with movement and touch of RUE. Patient feels most comfortable with arm in protective position.    Additional Assessments RUE Assessment RUE Assessment:  (assessed supine. IR/ER adducted) RUE PROM (degrees) Right Shoulder Flexion: 55 Degrees Right Shoulder ABduction: 50 Degrees Right Shoulder Internal Rotation: 80 Degrees Right Shoulder External Rotation: 15 Degrees RUE Strength Right Shoulder Flexion: 3-/5 Right Shoulder ABduction: 3-/5 Right Shoulder Internal Rotation: 3-/5 Right Shoulder External Rotation: 3-/5 Palpation Palpation: max fascial restrictions in right upper arm, trapezius, scapularis and cervical region.       Occupational Therapy Assessment and Plan OT Assessment and Plan Clinical Impression Statement: A: Patient is a 63 y/o female presenting with a right irrepairable rotator cuff tear s/p debridement resulting in decreased functional use of RUE, strength, and jont mobility and increased pain and fascial restrictions causing difficulty completing BADL and leisure tasks requiring use of RUE as dominant extremity. Pt will benefit from skilled therapeutic intervention in order to improve on the following deficits: Pain;Impaired UE functional use;Decreased strength;Decreased range of motion;Increased fascial restricitons Rehab Potential: Good OT Frequency: Min 2X/week OT Duration: 8 weeks OT Plan: P: Skilled OT intervention to increase pain free mobility and decrease restrictions in  right shoulder region for greater independence with daily activities.  Treatment Plan:  MFR and manual stretching in supine. May need to start with moist heat before manual stretching.  Therapeutic Exercises:  PROM,AAROM, AROM  and progress to strengthening, ball stretches, thumb tacks, prot/ret//elev/dep.  pulleys, progress as tolerated.     Goals Short Term Goals Time to Complete Short Term Goals: 4 weeks Short Term Goal 1: Patient will be educated on a HEP. Short Term Goal 2: Patient will increase PROM of RUE to [facility] to increase ability to donn shirt with less difficulty.  Short Term Goal 3: Patient will increase RUE strength to 3/5 to increase ability to return to using right arm as dominant extremity.  Short Term Goal 4: Patient will decrease pain to 8/10 during daily tasks.  Short Term Goal 5: Patient will decrease fascial restrictions in RUE from max to mod amount.  Long Term Goals Time to Complete Long Term Goals: 8 weeks Long Term Goal 1: Patient will return to highest level of independence with all BADL and leisure tasks.  Long Term Goal 2: Patient will increase AROM to half range to increase ability to reach out to shoulder height. Long Term Goal 3: Patient will increase RUE strength to 3+/5 to increase ability to use right arm as dominant extremity.  Long Term Goal 4: Patient will decrease pain in right arm to 5/10 during daily tasks.  Long Term Goal 5: Patient will decrease fascial restrictions from mod to min amount.   Problem List Patient Active Problem List   Diagnosis Date  Noted  . Pain in joint, shoulder region 01/16/2014  . Muscle weakness (generalized) 01/16/2014  . Decreased range of motion of right shoulder 01/16/2014  . Right rotator cuff tear 11/16/2013  . Rotator cuff tear 11/16/2013  . Rheumatoid arthritis   . CAD (coronary artery disease)   . HTN (hypertension)   . Hyperlipidemia   . Stroke   . Emotional stress reaction   . Leg pain   . Carotid artery  disease   . OVERWEIGHT/OBESITY 11/04/2008  . EDEMA 11/04/2008    End of Session Activity Tolerance: Patient tolerated treatment well General Behavior During Therapy: [facility] for tasks assessed/performed OT Plan of Care OT Home Exercise Plan: towel sides and wrist and elbow AROM exercises OT Patient Instructions: handout (scanned) Consulted and Agree with Plan of Care: Patient;Family member/caregiver Family Member Consulted: husband   Ailene Ravel, OTR/L,CBIS   01/16/2014, 1:15 PM  Physician Documentation Your signature is required to indicate approval of the treatment plan as stated above.  Please sign and either send electronically or make a copy of this report for your files and return this physician signed original.  Please mark one 1.__approve of plan  2. ___approve of plan with the following conditions.   ______________________________                                                          _____________________ Physician Signature                                                                                                             Date

## 2014-01-18 ENCOUNTER — Ambulatory Visit (HOSPITAL_COMMUNITY)
Admission: RE | Admit: 2014-01-18 | Discharge: 2014-01-18 | Disposition: A | Discharge status: Home / Self Care | Payer: 59 | Source: Ambulatory Visit | Attending: Family Medicine | Admitting: Family Medicine

## 2014-01-18 DIAGNOSIS — IMO0001 Reserved for inherently not codable concepts without codable children: Secondary | ICD-10-CM | POA: Diagnosis not present

## 2014-01-18 NOTE — Progress Notes (Signed)
Occupational Therapy Treatment Patient Details  Name: Cindy Holmes MRN: [id number] Date of Birth: [birth year]  Today's Date: 01/18/2014 Time: 8921-1941 OT Time Calculation (min): 42 min Moist Heat 20' Manual 7408-1448 (24') Therapeutic Exercises [phone] (8')  Visit#: 2 of 16  Re-eval: 02/13/14    Authorization:    Authorization Time Period:    Authorization Visit#:   of    Subjective Symptoms/Limitations Symptoms: "I'm pretty dissapointed, actually. He didn't tell me it was irrepairable." Pain Assessment Currently in Pain?: Yes Pain Score: 7  Pain Location: Shoulder Pain Orientation: Right Pain Type: Chronic pain  Precautions/Restrictions     Exercise/Treatments Seated Flexion: PROM;5 reps Abduction: PROM;5 reps      Elbow Exercises Elbow Flexion: PROM;10 reps Elbow Extension: PROM;10 reps Forearm Supination: PROM;10 reps;AROM;5 reps Forearm Pronation: PROM;10 reps;AROM;5 reps Other elbow exercises: Composite hand flexion extension 5 reps   Modalities Modalities: Moist Heat Manual Therapy Manual Therapy: Myofascial release Myofascial Release: MFR and manual stretching to RUE lower, mid, upper trap regions, lats, upper arm, bicep and anterior shoulder regions to decrease fascial restrictions to promote decrease pain with movement Moist Heat Therapy Number Minutes Moist Heat: 20 Minutes (10 min each with heat pack to anterior/poster shoulder regions and bicep/elbow region) Moist Heat Location: Elbow;Shoulder  Occupational Therapy Assessment and Plan OT Assessment and Plan Clinical Impression Statement: Applied moist heat pack for 10 min prior to beginning MFR on scapular region, and reapplied to bicep and elbo region for 10 min while donig MFR to scapular and upper arm regions.  Pt able to tolerate gentle MFR and had improved tolerance with longer duration holds.  Attempted slight PROM of elbow and shoulder - pt with significant increase in pain with movement.   pt required multiple verbal cues to attempt to relax arm while attempting PROM.  Session preformed in sitting, rather than supine, per pt preference.  Pt verbalized not being able to sleep in bed at home - provided information on wedges and availalbity at [facility] or [facility]. Pt verbalizes not having enough time to really try HEP (since eval was on Wednesday). OT Plan: Moist Heat pack pror to OT session.  Gentle MFR to scapular and bicep regions. Attempt ball stretches.   Goals Short Term Goals Short Term Goal 1: Patient will be educated on a HEP. Short Term Goal 1 Progress: Progressing toward goal Short Term Goal 2: Patient will increase PROM of RUE to [facility] to increase ability to donn shirt with less difficulty.  Short Term Goal 2 Progress: Progressing toward goal Short Term Goal 3: Patient will increase RUE strength to 3/5 to increase ability to return to using right arm as dominant extremity.  Short Term Goal 3 Progress: Progressing toward goal Short Term Goal 4: Patient will decrease pain to 8/10 during daily tasks.  Short Term Goal 4 Progress: Progressing toward goal Short Term Goal 5: Patient will decrease fascial restrictions in RUE from max to mod amount.  Short Term Goal 5 Progress: Progressing toward goal Long Term Goals Long Term Goal 1: Patient will return to highest level of independence with all BADL and leisure tasks.  Long Term Goal 1 Progress: Progressing toward goal Long Term Goal 2: Patient will increase AROM to half range to increase ability to reach out to shoulder height. Long Term Goal 2 Progress: Progressing toward goal Long Term Goal 3: Patient will increase RUE strength to 3+/5 to increase ability to use right arm as dominant extremity.  Long Term Goal 3  Progress: Progressing toward goal Long Term Goal 4: Patient will decrease pain in right arm to 5/10 during daily tasks.  Long Term Goal 4 Progress: Progressing toward goal Long Term Goal 5:  Patient will decrease fascial restrictions from mod to min amount.  Long Term Goal 5 Progress: Progressing toward goal  Problem List Patient Active Problem List   Diagnosis Date Noted  . Pain in joint, shoulder region 01/16/2014  . Muscle weakness (generalized) 01/16/2014  . Decreased range of motion of right shoulder 01/16/2014  . Right rotator cuff tear 11/16/2013  . Rotator cuff tear 11/16/2013  . Rheumatoid arthritis   . CAD (coronary artery disease)   . HTN (hypertension)   . Hyperlipidemia   . Stroke   . Emotional stress reaction   . Leg pain   . Carotid artery disease   . OVERWEIGHT/OBESITY 11/04/2008  . EDEMA 11/04/2008    End of Session Activity Tolerance: Patient tolerated treatment well General Behavior During Therapy: [facility] for tasks assessed/performed  GO   Bea Graff, MS, OTR/L [phone]  01/18/2014, 2:48 PM

## 2014-01-22 ENCOUNTER — Ambulatory Visit (HOSPITAL_COMMUNITY): Payer: 59

## 2014-01-23 ENCOUNTER — Ambulatory Visit (HOSPITAL_COMMUNITY)
Admission: RE | Admit: 2014-01-23 | Discharge: 2014-01-23 | Disposition: A | Discharge status: Home / Self Care | Payer: 59 | Source: Ambulatory Visit | Attending: Family Medicine | Admitting: Family Medicine

## 2014-01-23 DIAGNOSIS — IMO0001 Reserved for inherently not codable concepts without codable children: Secondary | ICD-10-CM | POA: Diagnosis not present

## 2014-01-23 NOTE — Progress Notes (Signed)
Occupational Therapy Treatment Patient Details  Name: Cindy Holmes MRN: [id number] Date of Birth: 26-Nov-1950  Today's Date: 01/23/2014 Time: 1320-1400 OT Time Calculation (min): 40 min MFR 1320-1340 20' Therex [id number] 10' Heat 1350-1400 10'  Visit#: 3 of 16  Re-eval: 02/13/14     Subjective Symptoms/Limitations Symptoms: S: I had an appt to see Dr. Mardelle Matte about my shoulder. I didn't stay to see him because I was waiting an hour and a half and didn't want to stay any longer.  Pain Assessment Currently in Pain?: Yes Pain Score: 7  Pain Location: Shoulder Pain Orientation: Right Pain Type: Chronic pain  Precautions/Restrictions  Precautions Precautions: None  Exercise/Treatments Supine Protraction: PROM;5 reps External Rotation: PROM;5 reps Internal Rotation: PROM;5 reps Flexion: PROM;5 reps ABduction: PROM;5 reps    Modalities Modalities: Moist Heat Manual Therapy Manual Therapy: Myofascial release Myofascial Release: Myofascial release (MFR) and manual stretching to RUE lower, mid, upper trap regions, lats, upper arm, bicep and anterior shoulder regions to decrease fascial restrictions to promote decrease pain with movement Moist Heat Therapy Number Minutes Moist Heat: 10 Minutes Moist Heat Location: Shoulder  Occupational Therapy Assessment and Plan OT Assessment and Plan Clinical Impression Statement: A: Patient missed morning appt and came in this afternoon instead. Therapy session was shortened due to time constraint. Patient continues to present with increased pain, tenderness, and fascial restrictions in right arm. Patient was very hesistant to let therapist perform passive stretching. Session was ended with moist heat . Was unable to provide moist heat at beginning of session due to time constraint.  OT Plan: P: Moist Heat pack pror to OT session.  Gentle MFR to scapular and bicep regions. Attempt ball stretches.   Goals Short Term Goals Time to  Complete Short Term Goals: 4 weeks Short Term Goal 1: Patient will be educated on a HEP. Short Term Goal 1 Progress: Progressing toward goal Short Term Goal 2: Patient will increase PROM of RUE to [facility] to increase ability to donn shirt with less difficulty.  Short Term Goal 2 Progress: Progressing toward goal Short Term Goal 3: Patient will increase RUE strength to 3/5 to increase ability to return to using right arm as dominant extremity.  Short Term Goal 3 Progress: Progressing toward goal Short Term Goal 4: Patient will decrease pain to 8/10 during daily tasks.  Short Term Goal 4 Progress: Progressing toward goal Short Term Goal 5: Patient will decrease fascial restrictions in RUE from max to mod amount.  Short Term Goal 5 Progress: Progressing toward goal Long Term Goals Time to Complete Long Term Goals: 8 weeks Long Term Goal 1: Patient will return to highest level of independence with all BADL and leisure tasks.  Long Term Goal 1 Progress: Progressing toward goal Long Term Goal 2: Patient will increase AROM to half range to increase ability to reach out to shoulder height. Long Term Goal 2 Progress: Progressing toward goal Long Term Goal 3: Patient will increase RUE strength to 3+/5 to increase ability to use right arm as dominant extremity.  Long Term Goal 3 Progress: Progressing toward goal Long Term Goal 4: Patient will decrease pain in right arm to 5/10 during daily tasks.  Long Term Goal 4 Progress: Progressing toward goal Long Term Goal 5: Patient will decrease fascial restrictions from mod to min amount.  Long Term Goal 5 Progress: Progressing toward goal  Problem List Patient Active Problem List   Diagnosis Date Noted  . Pain in joint, shoulder region 01/16/2014  .  Muscle weakness (generalized) 01/16/2014  . Decreased range of motion of right shoulder 01/16/2014  . Right rotator cuff tear 11/16/2013  . Rotator cuff tear 11/16/2013  . Rheumatoid arthritis   . CAD  (coronary artery disease)   . HTN (hypertension)   . Hyperlipidemia   . Stroke   . Emotional stress reaction   . Leg pain   . Carotid artery disease   . OVERWEIGHT/OBESITY 11/04/2008  . EDEMA 11/04/2008    End of Session Activity Tolerance: Patient tolerated treatment well General Behavior During Therapy: [facility] for tasks assessed/performed  Ailene Ravel, OTR/L,CBIS   01/23/2014, 2:14 PM

## 2014-01-25 ENCOUNTER — Ambulatory Visit (HOSPITAL_COMMUNITY)
Admission: RE | Admit: 2014-01-25 | Discharge: 2014-01-25 | Disposition: A | Discharge status: Home / Self Care | Payer: 59 | Source: Ambulatory Visit | Attending: Orthopedic Surgery | Admitting: Orthopedic Surgery

## 2014-01-25 DIAGNOSIS — IMO0001 Reserved for inherently not codable concepts without codable children: Secondary | ICD-10-CM | POA: Diagnosis not present

## 2014-01-25 NOTE — Progress Notes (Signed)
Occupational Therapy Treatment Patient Details  Name: Cindy Holmes MRN: [id number] Date of Birth: August 23, 1950  Today's Date: 01/25/2014 Time: 1012-1100 OT Time Calculation (min): 48 min Moist Heat 1012-1022 (10') Manual 1022-1048 (26') Therapeutic Exercises 1048-1100 (12')  Visit#: 4 of 16  Re-eval: 02/13/14    Authorization:    Authorization Time Period:    Authorization Visit#:   of    Subjective Symptoms/Limitations Symptoms: "I actually got that wedge and tried to sleep in the bed. I woke up and it was really hurting." Pain Assessment Currently in Pain?: Yes Pain Score: 8  Pain Location: Shoulder Pain Orientation: Right Pain Type: Chronic pain  Precautions/Restrictions     Exercise/Treatments Supine Protraction: PROM;5 reps External Rotation: PROM;5 reps Internal Rotation: PROM;5 reps Flexion: PROM;5 reps ABduction: PROM;5 reps Seated Elevation: AROM;5 reps Extension: AROM;5 reps Row: AROM;5 reps Therapy Ball Flexion: 5 reps  Manual Therapy Manual Therapy: Myofascial release Myofascial Release: Myofascial release (MFR) and manual stretching to RUE lower, mid, upper trap regions, lats, upper arm, bicep and anterior shoulder regions to decrease fascial restrictions to promote decrease pain with movement.    Occupational Therapy Assessment and Plan OT Assessment and Plan Clinical Impression Statement: Began session with 2 hotpacks to scapular and bicep regions - decreased fascail restricions noted, but continued increased tenderness. Pt able to tolerate PROM in supine again. Added seated elevation, extension, and row, and pt tolerated well. Added ball stretch in flexion - pt moved slowly and with little ROM, but tolerted. OT Plan: P: Moist Heat pack pror to OT session.  Gentle MFR to scapular and bicep regions. Attempt ball stretch in abduction.   Goals Short Term Goals Short Term Goal 1: Patient will be educated on a HEP. Short Term Goal 1 Progress:  Progressing toward goal Short Term Goal 2: Patient will increase PROM of RUE to [facility] to increase ability to donn shirt with less difficulty.  Short Term Goal 2 Progress: Progressing toward goal Short Term Goal 3: Patient will increase RUE strength to 3/5 to increase ability to return to using right arm as dominant extremity.  Short Term Goal 3 Progress: Progressing toward goal Short Term Goal 4: Patient will decrease pain to 8/10 during daily tasks.  Short Term Goal 4 Progress: Progressing toward goal Short Term Goal 5: Patient will decrease fascial restrictions in RUE from max to mod amount.  Short Term Goal 5 Progress: Progressing toward goal Long Term Goals Long Term Goal 1: Patient will return to highest level of independence with all BADL and leisure tasks.  Long Term Goal 1 Progress: Progressing toward goal Long Term Goal 2: Patient will increase AROM to half range to increase ability to reach out to shoulder height. Long Term Goal 2 Progress: Progressing toward goal Long Term Goal 3: Patient will increase RUE strength to 3+/5 to increase ability to use right arm as dominant extremity.  Long Term Goal 3 Progress: Progressing toward goal Long Term Goal 4: Patient will decrease pain in right arm to 5/10 during daily tasks.  Long Term Goal 4 Progress: Progressing toward goal Long Term Goal 5: Patient will decrease fascial restrictions from mod to min amount.  Long Term Goal 5 Progress: Progressing toward goal  Problem List Patient Active Problem List   Diagnosis Date Noted  . Pain in joint, shoulder region 01/16/2014  . Muscle weakness (generalized) 01/16/2014  . Decreased range of motion of right shoulder 01/16/2014  . Right rotator cuff tear 11/16/2013  . Rotator cuff tear  11/16/2013  . Rheumatoid arthritis   . CAD (coronary artery disease)   . HTN (hypertension)   . Hyperlipidemia   . Stroke   . Emotional stress reaction   . Leg pain   . Carotid artery disease   .  OVERWEIGHT/OBESITY 11/04/2008  . EDEMA 11/04/2008    End of Session Activity Tolerance: Patient tolerated treatment well General Behavior During Therapy: [facility] for tasks assessed/performed  GO   Bea Graff, MS, OTR/L [phone]  01/25/2014, 11:04 AM

## 2014-01-28 ENCOUNTER — Telehealth (HOSPITAL_COMMUNITY): Payer: Self-pay

## 2014-01-29 ENCOUNTER — Ambulatory Visit (HOSPITAL_COMMUNITY): Payer: 59

## 2014-01-31 ENCOUNTER — Telehealth (HOSPITAL_COMMUNITY): Payer: Self-pay

## 2014-02-01 ENCOUNTER — Ambulatory Visit (HOSPITAL_COMMUNITY): Payer: 59

## 2014-02-05 ENCOUNTER — Ambulatory Visit (HOSPITAL_COMMUNITY): Payer: 59

## 2014-02-08 ENCOUNTER — Ambulatory Visit (HOSPITAL_COMMUNITY): Payer: 59

## 2014-02-12 ENCOUNTER — Ambulatory Visit (HOSPITAL_COMMUNITY): Payer: 59

## 2014-02-13 ENCOUNTER — Ambulatory Visit (HOSPITAL_COMMUNITY): Payer: 59

## 2014-02-21 NOTE — Addendum Note (Signed)
Encounter addended by: Debby Bud, OT on: 02/21/2014  9:31 AM<BR>     Documentation filed: Letters, Notes Section, Episodes

## 2014-02-21 NOTE — Progress Notes (Signed)
  Patient Details  Name: [name] MRN: [id number] Date of Birth: 11-22-1950  Today's Date: 02/21/2014 The above patient was discharged from OT on 02/21/2014 secondary to failure to return to clinic since 01/25/14.  Ailene Ravel, OTR/L,CBIS   02/21/2014, 9:29 AM

## 2014-02-27 ENCOUNTER — Other Ambulatory Visit: Payer: Self-pay | Admitting: Orthopedic Surgery

## 2014-02-27 DIAGNOSIS — M25511 Pain in right shoulder: Secondary | ICD-10-CM

## 2014-03-11 ENCOUNTER — Other Ambulatory Visit: Payer: Self-pay | Admitting: *Deleted

## 2014-03-11 MED ORDER — METOPROLOL TARTRATE 50 MG PO TABS: ORAL_TABLET | ORAL | Status: DC

## 2014-03-13 ENCOUNTER — Encounter: Payer: Self-pay | Admitting: Cardiology

## 2014-03-13 ENCOUNTER — Ambulatory Visit (INDEPENDENT_AMBULATORY_CARE_PROVIDER_SITE_OTHER): Payer: 59 | Admitting: Cardiology

## 2014-03-13 ENCOUNTER — Other Ambulatory Visit: Payer: 59

## 2014-03-13 ENCOUNTER — Ambulatory Visit: Payer: 59 | Admitting: Cardiology

## 2014-03-13 VITALS — BP 126/70 | HR 80 | Ht 63.0 in | Wt 233.0 lb

## 2014-03-13 DIAGNOSIS — I2510 Atherosclerotic heart disease of native coronary artery without angina pectoris: Secondary | ICD-10-CM

## 2014-03-13 DIAGNOSIS — I10 Essential (primary) hypertension: Secondary | ICD-10-CM

## 2014-03-13 DIAGNOSIS — I739 Peripheral vascular disease, unspecified: Secondary | ICD-10-CM

## 2014-03-13 DIAGNOSIS — I779 Disorder of arteries and arterioles, unspecified: Secondary | ICD-10-CM

## 2014-03-13 DIAGNOSIS — R609 Edema, unspecified: Secondary | ICD-10-CM

## 2014-03-13 DIAGNOSIS — E785 Hyperlipidemia, unspecified: Secondary | ICD-10-CM

## 2014-03-13 DIAGNOSIS — M069 Rheumatoid arthritis, unspecified: Secondary | ICD-10-CM

## 2014-03-13 NOTE — Assessment & Plan Note (Addendum)
Coronary disease is stable. I've chosen to keep her on aspirin and Plavix. However this is a clinical bias on my part. If there is any issues with difficulties with dual antiplatelet therapy, her Plavix can be stopped.

## 2014-03-13 NOTE — Assessment & Plan Note (Signed)
She is receiving guidelines directed therapy for her lipids.

## 2014-03-13 NOTE — Assessment & Plan Note (Signed)
The patient is on very for her rheumatoid arthritis. I have carefully reviewed these medicines and discuss them with her as they relate to her heart. These medicines do not cause left ventricular dysfunction. At this time there is no reason for her to be concerned from the cardiac viewpoint about the medications that are being used.  As part of today's evaluation I spent greater than 25 minutes with her total care. More than half of this time is been with direct contact with her. We have discussed her coronary status. We've also discussed her other medications carefully.

## 2014-03-13 NOTE — Assessment & Plan Note (Signed)
Currently there is no significant edema. No change in therapy.

## 2014-03-13 NOTE — Assessment & Plan Note (Signed)
Blood pressure is controlled. No change in therapy. 

## 2014-03-13 NOTE — Patient Instructions (Signed)
Your physician recommends that you continue on your current medications as directed. Please refer to the Current Medication list given to you today.  Your physician wants you to follow-up in: 1 year. You will receive a reminder letter in the mail two months in advance. If you don't receive a letter, please call our office to schedule the follow-up appointment.  

## 2014-03-13 NOTE — Progress Notes (Signed)
Patient ID: Cindy Holmes, female   DOB: 1951/06/24, 63 y.o.   MRN: [id number]    HPI  Patient is seen today to followup coronary disease. She had rotator cuff surgery in May, 2015. Unfortunately she continues to have discomfort. She also has very significant rheumatoid arthritis and is on several very potent medications. She's not having any chest pain. She had a nuclear stress study August, 2014. Ejection fraction was normal and there was no ischemia. Her carotid Doppler in August, 2014 showed mild disease. 2 year followup was recommended.  Allergies  Allergen Reactions  . Codeine Phosphate Shortness Of Breath    REACTION: unspecified  . Amoxicillin Nausea And Vomiting    REACTION: unspecified  . Penicillins Nausea And Vomiting  . Lidocaine Rash    Patch caused rash    Current Outpatient Prescriptions  Medication Sig Dispense Refill  . allopurinol (ZYLOPRIM) 100 MG tablet Take 100 mg by mouth daily.      Marland Kitchen aspirin 81 MG tablet Take 81 mg by mouth daily.        Marland Kitchen atorvastatin (LIPITOR) 40 MG tablet Take 1 tablet (40 mg total) by mouth daily.  30 tablet  0  . clopidogrel (PLAVIX) 75 MG tablet Take 1 tablet (75 mg total) by mouth daily.  90 tablet  3  . desloratadine (CLARINEX) 5 MG tablet Take 5 mg by mouth daily.      . diazepam (VALIUM) 5 MG tablet Take 1 tablet by mouth daily.      . folic acid (FOLVITE) 1 MG tablet as directed. Takes with chemo medication.      . furosemide (LASIX) 40 MG tablet Take 1 tablet (40 mg total) by mouth daily.  90 tablet  3  . gabapentin (NEURONTIN) 100 MG capsule as directed.      Marland Kitchen HYDROmorphone (DILAUDID) 2 MG tablet Take 1 mg by mouth daily.      Marland Kitchen leflunomide (ARAVA) 20 MG tablet Take 20 mg by mouth daily.      . methocarbamol (ROBAXIN) 500 MG tablet Take 1 tablet (500 mg total) by mouth 4 (four) times daily.  75 tablet  1  . metoprolol (LOPRESSOR) 50 MG tablet take 1 tablet by mouth once daily  90 tablet  0  . NEXIUM 40 MG capsule 1 capsule as  needed.      . nitroGLYCERIN (NITROSTAT) 0.4 MG SL tablet Place 1 tablet (0.4 mg total) under the tongue every 5 (five) minutes as needed for chest pain.  25 tablet  3  . Omega-3 Fatty Acids (FISH OIL) 1000 MG CAPS Take by mouth daily.        . predniSONE (DELTASONE) 5 MG tablet Take 5 mg by mouth daily with breakfast. Takes 2 tabs daily      . ramipril (ALTACE) 2.5 MG capsule Take 1 capsule (2.5 mg total) by mouth daily.  90 capsule  3  . sennosides-docusate sodium (SENOKOT-S) 8.6-50 MG tablet Take 2 tablets by mouth daily.  30 tablet  1  . Tofacitinib Citrate (XELJANZ) 5 MG TABS Take 2 tablets by mouth.       No current facility-administered medications for this visit.    History   Social History  . Marital Status: Married    Spouse Name: N/A    Number of Children: N/A  . Years of Education: N/A   Occupational History  . Not on file.   Social History Main Topics  . Smoking status: Never Smoker   .  Smokeless tobacco: Not on file  . Alcohol Use: No  . Drug Use: No  . Sexual Activity: No   Other Topics Concern  . Not on file   Social History Narrative  . No narrative on file    Family History  Problem Relation Age of Onset  . Heart attack Mother   . Cancer      family history  . Diabetes Brother   . Diabetes Sister   . Heart attack      family history    Past Medical History  Diagnosis Date  . CAD (coronary artery disease)     PTCA LAD 2001/cath 2002-prox and mid LAD stents patent, PTCA ostium Dx 1/ nuclear 7/09. resting ST's become worse, no scar or ischemia. nuclear 11/18/09, ST changes with walking, no chest pain, anterior breat attenuation with no ischemia  . Overweight(278.02)     laproscopic adjustable gastric banding with APS standard system  . HTN (hypertension)   . Edema   . Hyperlipidemia   . Stroke     mini stroke in past ???  . Carotid artery disease     Doppler September, 2011, 0-39% bilateral disease mild  . Emotional stress reaction     8/11    . Leg pain     July, 2012, Arterial Dopplers March 08, 2011 normal  . Rheumatoid arthritis   . 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 PONV (postoperative nausea and vomiting)   . Right rotator cuff tear 11/16/2013    Past Surgical History  Procedure Laterality Date  . Tubal ligation    . Cholecystectomy    . Cardiac catheterization      with stent placement in 2001  . Laproscopic adjustable gastric  banding      with APS standard system.   . Coronary angioplasty      2002  . Pt has 3 stents      sept 2001  . Shoulder arthroscopy with subacromial decompression, rotator cuff repair and bicep tendon repair Right 11/16/2013    Procedure: RIGHT SHOULDER ARTHROSCOPY, EXTENSIVE DEBRIDEMENT, ROTATOR CUFF REPAIR ;  Surgeon: [name], MD;  Location: [facility];  Service: Orthopedics;  Laterality: Right;    Patient Active Problem List   Diagnosis Date Noted  . Pain in joint, shoulder region 01/16/2014  . Muscle weakness (generalized) 01/16/2014  . Decreased range of motion of right shoulder 01/16/2014  . Right rotator cuff tear 11/16/2013  . Rotator cuff tear 11/16/2013  . Rheumatoid arthritis   . CAD (coronary artery disease)   . HTN (hypertension)   . Hyperlipidemia   . Stroke   . Emotional stress reaction   . Leg pain   . Carotid artery disease   . OVERWEIGHT/OBESITY 11/04/2008  . EDEMA 11/04/2008    ROS   Patient denies fever, chills, headache, sweats, rash, change in vision, change in hearing, chest pain, cough, nausea vomiting, urinary symptoms. She does have significant symptoms from her shoulder and her rheumatoid arthritis. All other systems are reviewed and are negative.  PHYSICAL EXAM  Patient is oriented to person time and place. Affect is normal. She is overweight. Head is atraumatic. Sclera and conjunctiva are normal. There is no jugulovenous distention. Lungs are clear. Respiratory effort is nonlabored. Cardiac exam reveals  S1 and S2. The abdomen is soft. There is no peripheral edema. There are no skin rashes.  Filed Vitals:   03/13/14 0855  BP: 126/70  Pulse: 80  Height: 5\' 3"  (1.6 m)  Weight: 233 lb (105.688 kg)   I have reviewed the EKG done in May, 2015 around the time of her shoulder surgery. There are old mild diffuse nonspecific ST-T wave changes. There is no change from prior tracings.  ASSESSMENT & PLAN

## 2014-03-13 NOTE — Assessment & Plan Note (Signed)
There is mild carotid artery disease. No change in therapy.

## 2014-03-23 ENCOUNTER — Other Ambulatory Visit: Payer: 59

## 2014-03-27 ENCOUNTER — Other Ambulatory Visit: Payer: Self-pay | Admitting: *Deleted

## 2014-03-27 MED ORDER — CLOPIDOGREL BISULFATE 75 MG PO TABS
75.0000 mg | ORAL_TABLET | Freq: Every day | ORAL | Status: DC
Start: 2014-03-27 — End: 2015-03-06

## 2014-04-01 ENCOUNTER — Ambulatory Visit
Admission: RE | Admit: 2014-04-01 | Discharge: 2014-04-01 | Disposition: A | Discharge status: Home / Self Care | Payer: 59 | Source: Ambulatory Visit | Attending: Orthopedic Surgery | Admitting: Orthopedic Surgery

## 2014-04-01 DIAGNOSIS — M25511 Pain in right shoulder: Secondary | ICD-10-CM

## 2014-04-22 ENCOUNTER — Other Ambulatory Visit: Payer: Self-pay

## 2014-04-22 MED ORDER — ATORVASTATIN CALCIUM 40 MG PO TABS: 40.0000 mg | ORAL_TABLET | Freq: Every day | ORAL | Status: DC

## 2014-04-30 ENCOUNTER — Other Ambulatory Visit: Payer: Self-pay

## 2014-04-30 MED ORDER — RAMIPRIL 2.5 MG PO CAPS: 2.5000 mg | ORAL_CAPSULE | Freq: Every day | ORAL | Status: DC

## 2014-05-17 ENCOUNTER — Other Ambulatory Visit: Payer: Self-pay

## 2014-05-17 MED ORDER — FUROSEMIDE 40 MG PO TABS: 40.0000 mg | ORAL_TABLET | Freq: Every day | ORAL | Status: DC

## 2014-05-28 ENCOUNTER — Encounter: Payer: Self-pay | Admitting: Physical Medicine & Rehabilitation

## 2014-06-19 ENCOUNTER — Other Ambulatory Visit: Payer: Self-pay | Admitting: *Deleted

## 2014-06-19 MED ORDER — METOPROLOL TARTRATE 50 MG PO TABS: ORAL_TABLET | ORAL | Status: DC

## 2014-06-27 ENCOUNTER — Other Ambulatory Visit: Payer: Self-pay | Admitting: Physical Medicine & Rehabilitation

## 2014-06-27 ENCOUNTER — Encounter: Payer: 59 | Attending: Physical Medicine & Rehabilitation

## 2014-06-27 ENCOUNTER — Encounter: Payer: Self-pay | Admitting: Physical Medicine & Rehabilitation

## 2014-06-27 ENCOUNTER — Ambulatory Visit (HOSPITAL_BASED_OUTPATIENT_CLINIC_OR_DEPARTMENT_OTHER): Payer: 59 | Admitting: Physical Medicine & Rehabilitation

## 2014-06-27 VITALS — BP 136/78 | HR 77 | Resp 14

## 2014-06-27 DIAGNOSIS — G8928 Other chronic postprocedural pain: Secondary | ICD-10-CM | POA: Diagnosis not present

## 2014-06-27 DIAGNOSIS — Z5181 Encounter for therapeutic drug level monitoring: Secondary | ICD-10-CM | POA: Diagnosis not present

## 2014-06-27 DIAGNOSIS — Z79899 Other long term (current) drug therapy: Secondary | ICD-10-CM

## 2014-06-27 DIAGNOSIS — M069 Rheumatoid arthritis, unspecified: Secondary | ICD-10-CM

## 2014-06-27 DIAGNOSIS — M25511 Pain in right shoulder: Secondary | ICD-10-CM

## 2014-06-27 DIAGNOSIS — M7501 Adhesive capsulitis of right shoulder: Secondary | ICD-10-CM

## 2014-06-27 MED ORDER — TRAMADOL HCL 50 MG PO TABS: 50.0000 mg | ORAL_TABLET | Freq: Four times a day (QID) | ORAL | Status: DC

## 2014-06-27 MED ORDER — GABAPENTIN 300 MG PO CAPS: 300.0000 mg | ORAL_CAPSULE | Freq: Three times a day (TID) | ORAL | Status: DC

## 2014-06-27 NOTE — Patient Instructions (Signed)
May consider acupuncture for pain. This certainly will not cure your shoulder problems but can sometimes help with pain.  He also discussed suprascapular nerve block under ultrasound guidance he may consider this as a potential treatment to help reduce pain  Limited in terms of what we can prescribed secondary to intolerance of most of the narcotics. Will trial some tramadol in combination with a higher dose of gabapentin.

## 2014-06-27 NOTE — Progress Notes (Signed)
Subjective:    Patient ID: Cindy Holmes, female    DOB: [birth year], 63 y.o.   MRN: [id number]  HPI CC:  Right shoulder pain  63 year old female with chronic pain. She has had multiple joint issues over the years. She's had chronic right shoulder pain. She was eventually diagnosed with rheumatoid arthritis approximately 2 years ago. She has been on disease remitting agents. She sees a rheumatologist Dr. Ouida Sills. She was also referred to orthopedic surgery. She was diagnosed with rotator cuff tear. She underwent right shoulder arthroscopy with debridement, biceps tendon release, acromioplasty distal clavicular resection and repair of both the subscapularis and supraspinatus tendon. Unfortunately postoperatively she had one of the fixation screws screws come loose. She also gives a history of gout. This was diagnosed by rheumatology as well. Patient also on chronic Plavix for coronary stents. Reviewed MRI the shoulder 07/02/2013. Moderate degenerative changes before meals joint as well as glenohumeral joint arthritic change. Tear in the biceps long head. Motor rotator cuff tendinopathy supraspinatus tear with retraction. C-spine MRI from 11/21/2008 showed minimal degenerative changes C3-C6. Rheumatology notes reviewed. Limited SOUND of left first MTP demonstrated tophaceous crit gout this was on 07/11/2013. Reviewed low blood work included elevated cholesterol and triglycerides. Good kidney function. Complete blood count reviewed. Hemoglobin  13.1 white count 8.0. TSH normal 2.3   postoperative OT had increased pain question whether she re-tore or fixation came loose during that time.   Pain Inventory Average Pain 6 Pain Right Now 7 My pain is constant  In the last 24 hours, has pain interfered with the following? General activity 7 Relation with others 7 Enjoyment of life 9 What TIME of day is your pain at its worst? night Sleep (in general) Poor  Pain is worse with: walking,  bending, standing and some activites Pain improves with: none Relief from Meds: 0  Mobility use a cane use a walker how many minutes can you walk? 5-10 ability to climb steps?  no do you drive?  yes  Function retired  Neuro/Psych No problems in this area  Prior Studies x-rays CT/MRI  Physicians involved in your care Primary care . Rheumatologist . Orthopedist .   Family History  Problem Relation Age of Onset  . Heart attack Mother   . Cancer      family history  . Diabetes Brother   . Diabetes Sister   . Heart attack      family history   History   Social History  . Marital Status: Married    Spouse Name: N/A    Number of Children: N/A  . Years of Education: N/A   Social History Main Topics  . Smoking status: Never Smoker   . Smokeless tobacco: None  . Alcohol Use: No  . Drug Use: No  . Sexual Activity: No   Other Topics Concern  . None   Social History Narrative   Past Surgical History  Procedure Laterality Date  . Tubal ligation    . Cholecystectomy    . Cardiac catheterization      with stent placement in 2001  . Laproscopic adjustable gastric  banding      with APS standard system.   . Coronary angioplasty      2002  . Pt has 3 stents      sept 2001  . Shoulder arthroscopy with subacromial decompression, rotator cuff repair and bicep tendon repair Right 11/16/2013    Procedure: RIGHT SHOULDER ARTHROSCOPY, EXTENSIVE DEBRIDEMENT, ROTATOR CUFF REPAIR ;  Surgeon: [name], MD;  Location: [facility];  Service: Orthopedics;  Laterality: Right;   Past Medical History  Diagnosis Date  . CAD (coronary artery disease)     PTCA LAD 2001/cath 2002-prox and mid LAD stents patent, PTCA ostium Dx 1/ nuclear 7/09. resting ST's become worse, no scar or ischemia. nuclear 11/18/09, ST changes with walking, no chest pain, anterior breat attenuation with no ischemia  . Overweight(278.02)     laproscopic adjustable gastric banding with APS  standard system  . HTN (hypertension)   . Edema   . Hyperlipidemia   . Stroke     mini stroke in past ???  . Carotid artery disease     Doppler September, 2011, 0-39% bilateral disease mild  . Emotional stress reaction     8/11  . Leg pain     July, 2012, Arterial Dopplers March 08, 2011 normal  . Rheumatoid arthritis   . 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 PONV (postoperative nausea and vomiting)   . Right rotator cuff tear 11/16/2013   BP 136/78 mmHg  Pulse 77  Resp 14  SpO2 97%  Opioid Risk Score: 1 Fall Risk Score: Moderate Fall Risk (6-13 points) Review of Systems  Constitutional:       Night sweats Fever/chills  Respiratory: Positive for wheezing.   Endocrine:       High blood sugar  All other systems reviewed and are negative.      Objective:   Physical Exam  Constitutional: She is oriented to person, place, and time. She appears well-developed and well-nourished.  HENT:  Head: Normocephalic and atraumatic.  Eyes: Pupils are equal, round, and reactive to light.  Neck:  Cervical range of motion 75% flexion and extension and lateral rotation and ending.  Musculoskeletal:  Tenderness palpation right upper trap, right levator scapula, right infraspinatus  Neurological: She is alert and oriented to person, place, and time.  Psychiatric: She has a normal mood and affect.  Nursing note and vitals reviewed.  Tenderness palpation MCP #23 right greater than left side. Mild swelling. No erythema.  Right shoulder contracture. No abduction. External rotation to neutral internal rotation to 45, elbow flexion to 90 elbow extension to 0  Motor strength 4/5 bilateral hip flexors knee extensor ankle dorsiflex plantar flexor  Bilateral MTPs 16606 without evidence of erythema or effusion.  Sensation intact bilateral C6-C7 C8 T1 as well as L5-4-5 S1 dermatomal distribution.       Assessment & Plan:  1. Chronic pain related to rheumatoid  arthritis, intermittent gout symptoms as well as chronic right shoulder pain postoperative with  Contracture. No evidence of reflex synthetic dystrophy.  cosider right suprascapular nerve block. Discussed under ultrasound guidance. This may be helpful in relieving pain. Patient does not wish to pursue at the current time.  Patient gets sick to her stomach from a variety of pain medications including codeine as well as hydrocodone as well as oxycodone.  Tramadol does not give her side effects but is not very helpful. She does not recall what dose she was on in the past. We will resume tramadol 50 mg 4 times a day,May need to increase dose next month. We discussed she has limited options giving her medication intolerance We'll trial increased dose of gabapentin 300 mg 3 times a day currently on 100 mg. Consider Lyrica for this patient high incidence of fibromyalgia syndrome with rheumatoid arthritis

## 2014-06-28 LAB — PMP ALCOHOL METABOLITE (ETG): Ethyl Glucuronide (EtG): NEGATIVE ng/mL

## 2014-07-03 LAB — ZOLPIDEM (LC/MS-MS), URINE
ZOLPIDEM METABOLITE (GC/LS/MS) UR, CONFIRM: 516 ng/mL — AB (ref <5)
Zolpidem (GC/LC/MS), Ur confirm: 30 ng/mL — AB (ref <5)

## 2014-07-03 LAB — BENZODIAZEPINES (GC/LC/MS), URINE
Alprazolam metabolite (GC/LC/MS), ur confirm: NEGATIVE ng/mL (ref <25)
CLONAZEPAU: NEGATIVE ng/mL (ref <25)
Flurazepam metabolite (GC/LC/MS), ur confirm: NEGATIVE ng/mL (ref <50)
Lorazepam (GC/LC/MS), ur confirm: NEGATIVE ng/mL (ref <50)
MIDAZOLAMU: NEGATIVE ng/mL (ref <50)
Nordiazepam (GC/LC/MS), ur confirm: 68 ng/mL — AB (ref <50)
Oxazepam (GC/LC/MS), ur confirm: 572 ng/mL — AB (ref <50)
Temazepam (GC/LC/MS), ur confirm: 136 ng/mL — AB (ref <50)
Triazolam metabolite (GC/LC/MS), ur confirm: NEGATIVE ng/mL (ref <50)

## 2014-07-03 LAB — TRAMADOL, URINE
N-DESMETHYL-CIS-TRAMADOL: 5184 ng/mL (ref <100)
Tramadol, Urine: 32272 ng/mL (ref <100)

## 2014-07-04 LAB — PRESCRIPTION MONITORING PROFILE (SOLSTAS)
Amphetamine/Meth: NEGATIVE ng/mL
Barbiturate Screen, Urine: NEGATIVE ng/mL
Buprenorphine, Urine: NEGATIVE ng/mL
CARISOPRODOL, URINE: NEGATIVE ng/mL
COCAINE METABOLITES: NEGATIVE ng/mL
CREATININE, URINE: 275.63 mg/dL (ref >20.0)
Cannabinoid Scrn, Ur: NEGATIVE ng/mL
Fentanyl, Ur: NEGATIVE ng/mL
MDMA URINE: NEGATIVE ng/mL
MEPERIDINE UR: NEGATIVE ng/mL
Methadone Screen, Urine: NEGATIVE ng/mL
Nitrites, Initial: NEGATIVE ug/mL
OPIATE SCREEN, URINE: NEGATIVE ng/mL
OXYCODONE SCRN UR: NEGATIVE ng/mL
Propoxyphene: NEGATIVE ng/mL
Tapentadol, urine: NEGATIVE ng/mL
pH, Initial: 5.3 pH (ref 4.5–8.9)

## 2014-07-17 NOTE — Progress Notes (Signed)
Urine drug screen for this encounter is consistent for prescribed medications.   

## 2014-08-23 ENCOUNTER — Ambulatory Visit: Payer: 59 | Admitting: Physical Medicine & Rehabilitation

## 2014-09-09 ENCOUNTER — Telehealth: Payer: Self-pay | Admitting: Cardiology

## 2014-09-09 MED ORDER — ATORVASTATIN CALCIUM 80 MG PO TABS: 80.0000 mg | ORAL_TABLET | Freq: Every day | ORAL | Status: DC

## 2014-09-09 NOTE — Telephone Encounter (Signed)
**Note De-Identified  Obfuscation** No answer and no way to leave message as the pts VM has not been set up.

## 2014-09-09 NOTE — Telephone Encounter (Signed)
Per Dr Ron Parker the pt is advised to increase Atorvastatin to 80 mg daily, she verbalized understanding. Per the pts request I faxed RX to [facility] in [place].

## 2014-09-09 NOTE — Telephone Encounter (Addendum)
**Note De-Identified  Obfuscation** I called Dr Glo Herring office and requested that they fax [place] the pts most recent lab work which includes her Lipid profile. I was advised that the pts last lab work there was in November of 2015. I asked that they fax those results to me at [phone] and was advised by Medical records at that office that they were faxing those results as we spoke.

## 2014-09-09 NOTE — Telephone Encounter (Signed)
New Msg        Pt calling about cholesterol, states she would like Dr. Ron Parker to contact her PCP to get copies of bloodwork.  Pt also wants to increase Lipitor dosage.   Please return pt call.

## 2014-09-30 ENCOUNTER — Telehealth: Payer: Self-pay

## 2014-10-16 ENCOUNTER — Other Ambulatory Visit: Payer: Self-pay

## 2014-10-16 MED ORDER — RAMIPRIL 2.5 MG PO CAPS: 2.5000 mg | ORAL_CAPSULE | Freq: Every day | ORAL | Status: DC

## 2014-12-16 ENCOUNTER — Telehealth: Payer: Self-pay | Admitting: Cardiology

## 2014-12-16 NOTE — Telephone Encounter (Signed)
**Note De-Identified  Obfuscation** The pt c/o SOB with exertion for the past 2 to 3 weeks. She states that at times she feels dizziness along with her SOB. Also, she is concerned that her cholesterol and LFTs remain high even though she has been taking Lipitor 80 mg daily for a month and a half.  I explained the reasons for having high cholesterol and that along with taking Lipitor daily she should also watch her diet and avoid high cholesterol foods. I offered to mail her information to help her lower her cholosterol level but she states that she has "all of that from her PCP". She is requesting an appointment with Dr Ron Parker. I offered her an appointment with one of our PA's or NP's since Dr Ron Parker has no openings until January 17, 2015. The pt only wants to see Dr Ron Parker so an appointment has been scheduled for July 8.  Will forward message to Dr Ron Parker as Juluis Rainier.

## 2014-12-16 NOTE — Telephone Encounter (Signed)
Pt c/o Shortness Of Breath: STAT if SOB developed within the last 24 hours or pt is noticeably SOB on the phone  1. Are you currently SOB (can you hear that pt is SOB on the phone)? No  2. How long have you been experiencing SOB? A couple of weeks  3. Are you SOB when sitting or when up moving around? Moving around  4.  Are you currently experiencing any other symptoms? A little dizzy and wore out.   Cholesterol Remains really high and she has been on Liptor 80 mg for at least two months and her cholesterol is still high. Please call for details. PCP advised to see Cardiologist. Liver enzymes are up as well.

## 2014-12-17 ENCOUNTER — Encounter: Payer: Self-pay | Admitting: Cardiology

## 2015-01-17 ENCOUNTER — Ambulatory Visit (INDEPENDENT_AMBULATORY_CARE_PROVIDER_SITE_OTHER): Payer: 59 | Admitting: Cardiology

## 2015-01-17 ENCOUNTER — Encounter: Payer: Self-pay | Admitting: Cardiology

## 2015-01-17 VITALS — BP 130/74 | HR 76 | Ht 63.0 in | Wt 221.8 lb

## 2015-01-17 DIAGNOSIS — R0789 Other chest pain: Secondary | ICD-10-CM | POA: Diagnosis not present

## 2015-01-17 DIAGNOSIS — R0602 Shortness of breath: Secondary | ICD-10-CM | POA: Diagnosis not present

## 2015-01-17 DIAGNOSIS — I10 Essential (primary) hypertension: Secondary | ICD-10-CM

## 2015-01-17 DIAGNOSIS — I739 Peripheral vascular disease, unspecified: Secondary | ICD-10-CM

## 2015-01-17 DIAGNOSIS — I779 Disorder of arteries and arterioles, unspecified: Secondary | ICD-10-CM | POA: Diagnosis not present

## 2015-01-17 DIAGNOSIS — I2510 Atherosclerotic heart disease of native coronary artery without angina pectoris: Secondary | ICD-10-CM

## 2015-01-17 DIAGNOSIS — M069 Rheumatoid arthritis, unspecified: Secondary | ICD-10-CM

## 2015-01-17 MED ORDER — EZETIMIBE 10 MG PO TABS: 10.0000 mg | ORAL_TABLET | Freq: Every day | ORAL | Status: DC

## 2015-01-17 NOTE — Assessment & Plan Note (Signed)
The patient had a PTCA to the LAD in 2001. In 200 to the proximal and mid LAD stents were patent. PTCA was done to the ostium of diagonal #1. Historically her ST segments become worse with walking on the treadmill. Nuclear study in 2012 and 2014 revealed no ischemia. Her ejection fraction is normal. She's been having increasing shortness of breath and some chest tightness. We will proceed nuclear stress testing to see if there is any significant change.

## 2015-01-17 NOTE — Assessment & Plan Note (Signed)
The patient has an LDL in the range of 105 on Lipitor 80. We will add Zetia today. She will need follow-up labs in 6 weeks. After that time, consideration could be given to the possible use of a PCSK9 inhibitor if we do not get closer to goal.

## 2015-01-17 NOTE — Assessment & Plan Note (Signed)
Blood pressures controlled. No change in therapy. 

## 2015-01-17 NOTE — Assessment & Plan Note (Signed)
The patient has known bilateral carotid disease. It is now time for a two-year follow-up carotid Doppler.

## 2015-01-17 NOTE — Patient Instructions (Addendum)
Medication Instructions:  Start taking Zetia 10 mg daily  Labwork: None  Testing/Procedures: Your physician has requested that you have a carotid duplex. This test is an ultrasound of the carotid arteries in your neck. It looks at blood flow through these arteries that supply the brain with blood. Allow one hour for this exam. There are no restrictions or special instructions.  Your physician has requested that you have a lexiscan myoview. For further information please visit [email or web address]. Please follow instruction sheet, as given.  Follow-Up: Your physician wants you to follow-up in: 6 months with Dr Meda Coffee. You will receive a reminder letter in the mail two months in advance. If you don't receive a letter, please call our office to schedule the follow-up appointment.  Any Other Special Instructions Will Be Listed Below (If Applicable). Please call Dr Hurley Cisco at [phone] to schedule a new patient assessment.

## 2015-01-17 NOTE — Assessment & Plan Note (Signed)
The patient carries a diagnosis of rheumatoid arthritis. I have outlined in the first part of this note that I'm concerned about the medications that she is on. I do not know if they should be adjusted. I will be sending a copy of this note to Dr. Charlestine Night. I'll be asking him to help to see the patient as a new patient and reassess her entire rheumatologic status.

## 2015-01-17 NOTE — Progress Notes (Signed)
Cardiology Office Note   Date:  01/17/2015   ID:  Cindy Holmes, DOB 12/13/1950, MRN [id number]  PCP:  Leonides Grills, MD  Cardiologist:  Dola Argyle, MD   Chief Complaint  Patient presents with  . Appointment    Follow-up coronary disease      History of Present Illness: Cindy Holmes is a 64 y.o. female who presents today to follow up coronary disease. She has been having some chest tightness. She also has had some exertional shortness of breath. She has known coronary disease. Her last nuclear study was 2 years ago.  In addition the patient is worried about her cholesterol. Despite Lipitor 80 mg, her LDL remains in the range of 105. She is worried about her liver functions. I explained to her that she has only a slight elevation in alkaline phosphatase. This is not a significant concern at this point.  The patient has rheumatoid arthritis. She has been treated by [facility] who is a rheumatologist. It is my understanding that Dr. Ouida Sills is no longer practicing rheumatology in [place]. I am concerned about the medication regimen that she is on. I am not knowledgeable enough in this area to make any recommendations to her. I will be asking Dr. Charlestine Night to see if he will take her on as a new patient to reassess her rheumatoid arthritis and her medications.  The patient is aware that I am retiring at the end of September. After our discussion today she would like to be followed by Dr. Meda Coffee.    Past Medical History  Diagnosis Date  . CAD (coronary artery disease)     PTCA LAD 2001/cath 2002-prox and mid LAD stents patent, PTCA ostium Dx 1/ nuclear 7/09. resting ST's become worse, no scar or ischemia. nuclear 11/18/09, ST changes with walking, no chest pain, anterior breat attenuation with no ischemia  . Overweight(278.02)     laproscopic adjustable gastric banding with APS standard system  . HTN (hypertension)   . Edema   . Hyperlipidemia   . Stroke     mini stroke  in past ???  . Carotid artery disease     Doppler September, 2011, 0-39% bilateral disease mild  . Emotional stress reaction     8/11  . Leg pain     July, 2012, Arterial Dopplers March 08, 2011 normal  . Rheumatoid arthritis   . 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 PONV (postoperative nausea and vomiting)   . Right rotator cuff tear 11/16/2013    Past Surgical History  Procedure Laterality Date  . Tubal ligation    . Cholecystectomy    . Cardiac catheterization      with stent placement in 2001  . Laproscopic adjustable gastric  banding      with APS standard system.   . Coronary angioplasty      2002  . Pt has 3 stents      sept 2001  . Shoulder arthroscopy with subacromial decompression, rotator cuff repair and bicep tendon repair Right 11/16/2013    Procedure: RIGHT SHOULDER ARTHROSCOPY, EXTENSIVE DEBRIDEMENT, ROTATOR CUFF REPAIR ;  Surgeon: [name], MD;  Location: [place];  Service: Orthopedics;  Laterality: Right;    Patient Active Problem List   Diagnosis Date Noted  . Pain in joint, shoulder region 01/16/2014  . Muscle weakness (generalized) 01/16/2014  . Decreased range of motion of right shoulder 01/16/2014  . Right rotator cuff tear  11/16/2013  . Rotator cuff tear 11/16/2013  . Rheumatoid arthritis   . CAD (coronary artery disease)   . HTN (hypertension)   . Hyperlipidemia   . Stroke   . Emotional stress reaction   . Leg pain   . Carotid artery disease   . OVERWEIGHT/OBESITY 11/04/2008  . EDEMA 11/04/2008      Current Outpatient Prescriptions  Medication Sig Dispense Refill  . allopurinol (ZYLOPRIM) 300 MG tablet Take 300 mg by mouth daily.  0  . aspirin 81 MG tablet Take 81 mg by mouth daily.      Marland Kitchen atorvastatin (LIPITOR) 80 MG tablet Take 1 tablet (80 mg total) by mouth daily. 30 tablet 6  . clopidogrel (PLAVIX) 75 MG tablet Take 1 tablet (75 mg total) by mouth daily. 90 tablet 3  . desloratadine  (CLARINEX) 5 MG tablet Take 5 mg by mouth daily.    . diazepam (VALIUM) 5 MG tablet Take 1 tablet by mouth daily.    . folic acid (FOLVITE) 1 MG tablet as directed. Takes with chemo medication.    . furosemide (LASIX) 40 MG tablet Take 1 tablet (40 mg total) by mouth daily. 90 tablet 3  . gabapentin (NEURONTIN) 300 MG capsule Take 1 capsule (300 mg total) by mouth 3 (three) times daily. 90 capsule 1  . HYDROmorphone (DILAUDID) 2 MG tablet Take 1 mg by mouth daily.    Marland Kitchen leflunomide (ARAVA) 20 MG tablet Take 20 mg by mouth daily.    . methocarbamol (ROBAXIN) 500 MG tablet Take 1 tablet (500 mg total) by mouth 4 (four) times daily. 75 tablet 1  . metoprolol (LOPRESSOR) 50 MG tablet take 1 tablet by mouth once daily 90 tablet 2  . NEXIUM 40 MG capsule 1 capsule as needed.    . nitroGLYCERIN (NITROSTAT) 0.4 MG SL tablet Place 1 tablet (0.4 mg total) under the tongue every 5 (five) minutes as needed for chest pain. 25 tablet 3  . Omega-3 Fatty Acids (FISH OIL) 1000 MG CAPS Take by mouth daily.      . predniSONE (DELTASONE) 5 MG tablet Take 10 mg by mouth daily with breakfast.     . PRESCRIPTION MEDICATION Take 5 mg by mouth daily. [name]    . ramipril (ALTACE) 2.5 MG capsule Take 1 capsule (2.5 mg total) by mouth daily. 90 capsule 1  . sennosides-docusate sodium (SENOKOT-S) 8.6-50 MG tablet Take 2 tablets by mouth daily. 30 tablet 1  . Tofacitinib Citrate (XELJANZ) 5 MG TABS Take 2 tablets by mouth.    . traMADol (ULTRAM) 50 MG tablet Take 1 tablet (50 mg total) by mouth 4 (four) times daily. 120 tablet 1  . zolpidem (AMBIEN) 5 MG tablet Take 5 mg by mouth at bedtime.  0   No current facility-administered medications for this visit.    Allergies:   Codeine phosphate; Amoxicillin; Penicillins; and Lidocaine    Social History:  The patient  reports that she has never smoked. She has never used smokeless tobacco. She reports that she does not drink alcohol or use illicit drugs.   Family History:   The patient's family history includes Arthritis/Rheumatoid in her father; Cancer in her maternal aunt, maternal grandfather, maternal grandmother, and another family member; Diabetes in her brother and sister; Heart attack in her maternal aunt, maternal uncle, mother, and another family member; Heart disease in her brother, brother, maternal aunt, and mother; Other in her father.    ROS:  Please see the history of  present illness.   Patient denies fever, chills, headache, sweats, rash, change in vision, change in hearing, cough, nausea vomiting, urinary symptoms. All other systems are reviewed and are negative.     PHYSICAL EXAM: VS:  BP 130/74 mmHg  Pulse 76  Ht 5\' 3"  (1.6 m)  Wt 221 lb 12.8 oz (100.608 kg)  BMI 39.30 kg/m2 , Patient is oriented to person time and place. Affect is normal. She is overweight but stable. Head is atraumatic. Sclerae and conjunctivae are normal. There is no jugular venous distention. Lungs are clear. Respiratory effort is not labored. Cardiac exam reveals S1 and S2. The abdomen is soft. There is no peripheral edema. There are no musculoskeletal deformities. There are no skin rashes.  EKG:   EKG is done today and reviewed by me. There are old diffuse nonspecific ST-T wave changes.   Recent Labs: No results found for requested labs within last 365 days.    Lipid Panel    Component Value Date/Time   CHOL 197 12/26/2013 1048   TRIG 181.0* 12/26/2013 1048   HDL 89.70 12/26/2013 1048   CHOLHDL 2 12/26/2013 1048   VLDL 36.2 12/26/2013 1048   LDLCALC 71 12/26/2013 1048      Wt Readings from Last 3 Encounters:  01/17/15 221 lb 12.8 oz (100.608 kg)  03/13/14 233 lb (105.688 kg)  11/16/13 226 lb (102.513 kg)      Current medicines are reviewed  The patient understands her medications.     ASSESSMENT AND PLAN:

## 2015-01-29 ENCOUNTER — Telehealth (HOSPITAL_COMMUNITY): Payer: Self-pay | Admitting: *Deleted

## 2015-01-29 NOTE — Telephone Encounter (Signed)
Left message on voicemail in reference to upcoming appointment scheduled for 02/03/15. Phone number given for a call back so details instructions can be given. Beverely Suen J Charistopher Rumble, RN  

## 2015-01-29 NOTE — Telephone Encounter (Signed)
Patient given detailed instructions per Myocardial Perfusion Study Information Sheet for test on 02/03/16 at 9:00. Patient Notified to arrive 15 minutes early, and that it is imperative to arrive on time for appointment to keep from having the test rescheduled. Patient verbalized understanding. Hubbard Robinson, RN

## 2015-02-03 ENCOUNTER — Ambulatory Visit (HOSPITAL_COMMUNITY): Payer: 59 | Attending: Cardiology

## 2015-02-03 ENCOUNTER — Ambulatory Visit (HOSPITAL_BASED_OUTPATIENT_CLINIC_OR_DEPARTMENT_OTHER): Payer: 59

## 2015-02-03 DIAGNOSIS — R0789 Other chest pain: Secondary | ICD-10-CM

## 2015-02-03 DIAGNOSIS — I6523 Occlusion and stenosis of bilateral carotid arteries: Secondary | ICD-10-CM | POA: Diagnosis not present

## 2015-02-03 DIAGNOSIS — I2510 Atherosclerotic heart disease of native coronary artery without angina pectoris: Secondary | ICD-10-CM | POA: Insufficient documentation

## 2015-02-03 DIAGNOSIS — I739 Peripheral vascular disease, unspecified: Secondary | ICD-10-CM

## 2015-02-03 DIAGNOSIS — I779 Disorder of arteries and arterioles, unspecified: Secondary | ICD-10-CM

## 2015-02-03 DIAGNOSIS — R0602 Shortness of breath: Secondary | ICD-10-CM | POA: Insufficient documentation

## 2015-02-03 LAB — MYOCARDIAL PERFUSION IMAGING
CHL CUP NUCLEAR SSS: 5
CSEPPHR: 82 {beats}/min
LV dias vol: 82 mL
LV sys vol: 21 mL
RATE: 0.31
Rest HR: 62 {beats}/min
SDS: 2
SRS: 3
TID: 0.93

## 2015-02-03 MED ORDER — TECHNETIUM TC 99M SESTAMIBI GENERIC - CARDIOLITE
10.6000 | Freq: Once | INTRAVENOUS | Status: AC | PRN
  Administered 2015-02-03: 11 via INTRAVENOUS

## 2015-02-03 MED ORDER — REGADENOSON 0.4 MG/5ML IV SOLN
0.4000 mg | Freq: Once | INTRAVENOUS | Status: AC
  Administered 2015-02-03: 0.4 mg via INTRAVENOUS

## 2015-02-03 MED ORDER — TECHNETIUM TC 99M SESTAMIBI GENERIC - CARDIOLITE
31.3000 | Freq: Once | INTRAVENOUS | Status: AC | PRN
Start: 2015-02-03 — End: 2015-02-03
  Administered 2015-02-03: 31.3 via INTRAVENOUS

## 2015-02-05 ENCOUNTER — Telehealth: Payer: Self-pay | Admitting: Cardiology

## 2015-02-05 NOTE — Telephone Encounter (Signed)
Results of Myocardial Perfusion test have not been released by physician to give the patient as yet.  Informed patient

## 2015-02-05 NOTE — Telephone Encounter (Signed)
New message     Pt calling about test results. Please call to discuss

## 2015-03-06 ENCOUNTER — Other Ambulatory Visit: Payer: Self-pay | Admitting: *Deleted

## 2015-03-06 MED ORDER — CLOPIDOGREL BISULFATE 75 MG PO TABS: 75.0000 mg | ORAL_TABLET | Freq: Every day | ORAL | Status: DC

## 2015-03-13 ENCOUNTER — Other Ambulatory Visit: Payer: Self-pay | Admitting: Orthopedic Surgery

## 2015-03-13 DIAGNOSIS — M545 Low back pain: Secondary | ICD-10-CM

## 2015-03-20 ENCOUNTER — Telehealth: Payer: Self-pay | Admitting: Cardiology

## 2015-03-20 DIAGNOSIS — E785 Hyperlipidemia, unspecified: Secondary | ICD-10-CM

## 2015-03-20 NOTE — Telephone Encounter (Signed)
New message      Pt is on cholesterol medication.  She want blood work to check lipids.  Please call her and let her know the order is in.  Also, pt has stents.  She lost her card that she carries stating her stent information.  How can she get another card?

## 2015-03-20 NOTE — Telephone Encounter (Signed)
Pt called to see if she can have her Lipids panel check. Pt was seen in Dr. Kae Heller clinic on 01/17/15 Zetia 10 mg once daily was prescribed then. Dr Kae Heller note states pt needs Blood work 6 weeks later. Lipid panel and LFT was order for pt to have done tomorrow 03/21/15. Pt also said that she lost her stent card. She wants to know if we can  Replaced it. Is aware that we can't replaced it because this card is given in the hospital. last office visit with information of the stent placed at the front desk for her.  Pt is aware.

## 2015-03-21 ENCOUNTER — Other Ambulatory Visit (INDEPENDENT_AMBULATORY_CARE_PROVIDER_SITE_OTHER): Payer: 59 | Admitting: *Deleted

## 2015-03-21 ENCOUNTER — Other Ambulatory Visit: Payer: 59

## 2015-03-21 DIAGNOSIS — E785 Hyperlipidemia, unspecified: Secondary | ICD-10-CM | POA: Diagnosis not present

## 2015-03-21 LAB — LIPID PANEL
Cholesterol: 147 mg/dL (ref 0–200)
HDL: 47.3 mg/dL (ref >39.00)
LDL Cholesterol: 69 mg/dL (ref 0–99)
NONHDL: 99.92
Total CHOL/HDL Ratio: 3
Triglycerides: 156 mg/dL — ABNORMAL HIGH (ref 0.0–149.0)
VLDL: 31.2 mg/dL (ref 0.0–40.0)

## 2015-03-21 LAB — HEPATIC FUNCTION PANEL
ALT: 17 U/L (ref 0–35)
AST: 13 U/L (ref 0–37)
Albumin: 4 g/dL (ref 3.5–5.2)
Alkaline Phosphatase: 106 U/L (ref 39–117)
BILIRUBIN DIRECT: 0.1 mg/dL (ref 0.0–0.3)
Total Bilirubin: 0.3 mg/dL (ref 0.2–1.2)
Total Protein: 6.7 g/dL (ref 6.0–8.3)

## 2015-03-26 ENCOUNTER — Telehealth: Payer: Self-pay | Admitting: Cardiology

## 2015-03-26 NOTE — Telephone Encounter (Signed)
New message ° ° ° ° °Want lab results °

## 2015-03-26 NOTE — Telephone Encounter (Signed)
The pt has been given her lab results and she verbalized understanding. 

## 2015-03-29 ENCOUNTER — Ambulatory Visit
Admission: RE | Admit: 2015-03-29 | Discharge: 2015-03-29 | Disposition: A | Discharge status: Home / Self Care | Payer: 59 | Source: Ambulatory Visit | Attending: Orthopedic Surgery | Admitting: Orthopedic Surgery

## 2015-03-29 DIAGNOSIS — M545 Low back pain: Secondary | ICD-10-CM

## 2015-04-01 ENCOUNTER — Other Ambulatory Visit: Payer: Self-pay | Admitting: Cardiology

## 2015-04-01 MED ORDER — METOPROLOL TARTRATE 50 MG PO TABS: ORAL_TABLET | ORAL | Status: DC

## 2015-04-08 ENCOUNTER — Other Ambulatory Visit: Payer: Self-pay

## 2015-04-08 MED ORDER — ATORVASTATIN CALCIUM 80 MG PO TABS: 80.0000 mg | ORAL_TABLET | Freq: Every day | ORAL | Status: DC

## 2015-04-08 NOTE — Telephone Encounter (Signed)
Carlena Bjornstad, MD at 01/17/2015 2:48 PM  atorvastatin (LIPITOR) 80 MG tabletTake 1 tablet (80 mg total) by mouth daily Hyperlipidemia - Carlena Bjornstad, MD at 01/17/2015 3:06 PM     Status: Written Related Problem: Hyperlipidemia   Expand All Collapse All   The patient has an LDL in the range of 105 on Lipitor 80. We will add Zetia today. She will need follow-up labs in 6 weeks. After that time, consideration could be given to the possible use of a PCSK9 inhibitor if we do not get closer to goal.      Notes Recorded by Carlena Bjornstad, MD on 03/23/2015 at 11:48 AM Please notify patient that I have reviewed her lipid labs. I am pleased with the results on her current meds. LDL of 69 is in the range that we want. Liver function is normal.

## 2015-04-14 ENCOUNTER — Other Ambulatory Visit: Payer: Self-pay | Admitting: *Deleted

## 2015-04-14 MED ORDER — ATORVASTATIN CALCIUM 40 MG PO TABS: 80.0000 mg | ORAL_TABLET | Freq: Every day | ORAL | Status: DC

## 2015-04-14 NOTE — Telephone Encounter (Signed)
Patient called and stated that she has always gotten 40mg  atorvastatin tablets and takes two daily as she is unable to swallow the 80mg  tablets.

## 2015-05-05 ENCOUNTER — Other Ambulatory Visit: Payer: Self-pay | Admitting: *Deleted

## 2015-05-05 MED ORDER — FUROSEMIDE 40 MG PO TABS: 40.0000 mg | ORAL_TABLET | Freq: Every day | ORAL | Status: DC

## 2015-05-21 ENCOUNTER — Other Ambulatory Visit: Payer: Self-pay

## 2015-05-21 MED ORDER — RAMIPRIL 2.5 MG PO CAPS: 2.5000 mg | ORAL_CAPSULE | Freq: Every day | ORAL | Status: DC

## 2015-06-02 ENCOUNTER — Other Ambulatory Visit: Payer: Self-pay

## 2015-06-02 MED ORDER — NITROGLYCERIN 0.4 MG SL SUBL: 0.4000 mg | SUBLINGUAL_TABLET | SUBLINGUAL | Status: DC | PRN

## 2015-08-03 ENCOUNTER — Other Ambulatory Visit: Payer: Self-pay | Admitting: Cardiology

## 2015-08-04 ENCOUNTER — Other Ambulatory Visit: Payer: Self-pay | Admitting: *Deleted

## 2015-08-04 MED ORDER — ATORVASTATIN CALCIUM 40 MG PO TABS: 80.0000 mg | ORAL_TABLET | Freq: Every day | ORAL | Status: DC

## 2015-11-13 ENCOUNTER — Encounter: Payer: Self-pay | Admitting: Cardiology

## 2015-11-13 ENCOUNTER — Ambulatory Visit (INDEPENDENT_AMBULATORY_CARE_PROVIDER_SITE_OTHER): Payer: Medicare Other | Admitting: Cardiology

## 2015-11-13 VITALS — BP 120/72 | HR 73 | Ht 63.0 in | Wt 218.0 lb

## 2015-11-13 DIAGNOSIS — R0789 Other chest pain: Secondary | ICD-10-CM | POA: Diagnosis not present

## 2015-11-13 DIAGNOSIS — I2510 Atherosclerotic heart disease of native coronary artery without angina pectoris: Secondary | ICD-10-CM | POA: Diagnosis not present

## 2015-11-13 DIAGNOSIS — E785 Hyperlipidemia, unspecified: Secondary | ICD-10-CM

## 2015-11-13 DIAGNOSIS — I5033 Acute on chronic diastolic (congestive) heart failure: Secondary | ICD-10-CM | POA: Insufficient documentation

## 2015-11-13 DIAGNOSIS — I10 Essential (primary) hypertension: Secondary | ICD-10-CM | POA: Diagnosis not present

## 2015-11-13 DIAGNOSIS — R079 Chest pain, unspecified: Secondary | ICD-10-CM | POA: Insufficient documentation

## 2015-11-13 LAB — COMPREHENSIVE METABOLIC PANEL
ALT: 16 U/L (ref 6–29)
AST: 9 U/L — ABNORMAL LOW (ref 10–35)
Albumin: 4 g/dL (ref 3.6–5.1)
Alkaline Phosphatase: 103 U/L (ref 33–130)
BUN: 11 mg/dL (ref 7–25)
CO2: 28 mmol/L (ref 20–31)
Calcium: 9.3 mg/dL (ref 8.6–10.4)
Chloride: 98 mmol/L (ref 98–110)
Creat: 0.68 mg/dL (ref 0.50–0.99)
Glucose, Bld: 105 mg/dL — ABNORMAL HIGH (ref 65–99)
Potassium: 3.9 mmol/L (ref 3.5–5.3)
Sodium: 139 mmol/L (ref 135–146)
Total Bilirubin: 0.4 mg/dL (ref 0.2–1.2)
Total Protein: 6.6 g/dL (ref 6.1–8.1)

## 2015-11-13 LAB — CBC WITH DIFFERENTIAL/PLATELET
Basophils Absolute: 0 cells/uL (ref 0–200)
Basophils Relative: 0 %
Eosinophils Absolute: 180 cells/uL (ref 15–500)
Eosinophils Relative: 2 %
HCT: 39.5 % (ref 35.0–45.0)
Hemoglobin: 13.5 g/dL (ref 11.7–15.5)
Lymphocytes Relative: 18 %
Lymphs Abs: 1620 cells/uL (ref 850–3900)
MCH: 29.4 pg (ref 27.0–33.0)
MCHC: 34.2 g/dL (ref 32.0–36.0)
MCV: 86.1 fL (ref 80.0–100.0)
MPV: 9.5 fL (ref 7.5–12.5)
Monocytes Absolute: 540 cells/uL (ref 200–950)
Monocytes Relative: 6 %
Neutro Abs: 6660 cells/uL (ref 1500–7800)
Neutrophils Relative %: 74 %
Platelets: 307 10*3/uL (ref 140–400)
RBC: 4.59 MIL/uL (ref 3.80–5.10)
RDW: 14.4 % (ref 11.0–15.0)
WBC: 9 10*3/uL (ref 3.8–10.8)

## 2015-11-13 LAB — LIPID PANEL
Cholesterol: 199 mg/dL (ref 125–200)
HDL: 61 mg/dL (ref >=46)
LDL Cholesterol: 81 mg/dL (ref <130)
Total CHOL/HDL Ratio: 3.3 Ratio (ref <=5.0)
Triglycerides: 287 mg/dL — ABNORMAL HIGH (ref <150)
VLDL: 57 mg/dL — ABNORMAL HIGH (ref <30)

## 2015-11-13 MED ORDER — METOPROLOL TARTRATE 50 MG PO TABS: ORAL_TABLET | ORAL | Status: DC

## 2015-11-13 MED ORDER — ATORVASTATIN CALCIUM 40 MG PO TABS: 80.0000 mg | ORAL_TABLET | Freq: Every day | ORAL | Status: DC

## 2015-11-13 MED ORDER — RAMIPRIL 2.5 MG PO CAPS: 2.5000 mg | ORAL_CAPSULE | Freq: Every day | ORAL | Status: DC

## 2015-11-13 MED ORDER — EZETIMIBE 10 MG PO TABS: 10.0000 mg | ORAL_TABLET | Freq: Every day | ORAL | Status: DC

## 2015-11-13 MED ORDER — FUROSEMIDE 40 MG PO TABS: 40.0000 mg | ORAL_TABLET | Freq: Every day | ORAL | Status: DC

## 2015-11-13 MED ORDER — CLOPIDOGREL BISULFATE 75 MG PO TABS: 75.0000 mg | ORAL_TABLET | Freq: Every day | ORAL | Status: DC

## 2015-11-13 MED FILL — EZETIMIBE 10 MG TABLET: 10 | 30 days supply | Qty: 30 | Fill #0

## 2015-11-13 MED FILL — ATORVASTATIN 40 MG TABLET: 40 | 30 days supply | Qty: 60 | Fill #0

## 2015-11-13 NOTE — Progress Notes (Signed)
Cardiology Office Note    Date:  11/13/2015   ID:  Cindy Holmes, DOB [birth year], MRN [id number]  PCP:  Leonides Grills, MD  Cardiologist:   Dorothy Spark, MD   No chief complaint on file.   History of Present Illness:  Cindy Holmes is a 65 y.o. female who was previously followed by Dr. Ron Parker for known coronary artery disease, hypertension and hyperlipidemia. She had a cardiac catheterization with PTCA of her LAD and stent placement in the mid LAD. She had follow-up stress test in July 2016 for atypical chest pain that showed hyperdynamic LVEF and no evidence of prior scar or ischemia. Patient was concerned about her LDL being 105 despite atorvastatin 80 mg daily and Zetia was added, follow-up LFTs were normal and LDL in September 2016 was 69, HDL 47 and triglyceride 156.  She is coming after one year, she states that her chest pain are pretty occasional and atypical not related to exertion. No palpitations or syncope stable dyspnea on exertion associated with her morbid obesity. She has been experiencing lower extremity edema and couple times a month she needs to use an extra Lasix per day otherwise no orthopnea or proximal nocturnal dyspnea.  She has multiple orthopedic problems and will require steroid injection and screws removed from her shoulder for which she will need preop evaluation.  She is otherwise compliant with her medications and denies any side effects.   Past Medical History  Diagnosis Date  . CAD (coronary artery disease)     PTCA LAD 2001/cath 2002-prox and mid LAD stents patent, PTCA ostium Dx 1/ nuclear 7/09. resting ST's become worse, no scar or ischemia. nuclear 11/18/09, ST changes with walking, no chest pain, anterior breat attenuation with no ischemia  . Overweight(278.02)     laproscopic adjustable gastric banding with APS standard system  . HTN (hypertension)   . Edema   . Hyperlipidemia   . Stroke [facility])     mini stroke in past ???  . Carotid  artery disease ([place])     Doppler September, 2011, 0-39% bilateral disease mild  . Emotional stress reaction     8/11  . Leg pain     July, 2012, Arterial Dopplers March 08, 2011 normal  . Rheumatoid arthritis ([place])   . 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 PONV (postoperative nausea and vomiting)   . Right rotator cuff tear 11/16/2013    Past Surgical History  Procedure Laterality Date  . Tubal ligation    . Cholecystectomy    . Cardiac catheterization      with stent placement in 2001  . Laproscopic adjustable gastric  banding      with APS standard system.   . Coronary angioplasty      2002  . Pt has 3 stents      sept 2001  . Shoulder arthroscopy with subacromial decompression, rotator cuff repair and bicep tendon repair Right 11/16/2013    Procedure: RIGHT SHOULDER ARTHROSCOPY, EXTENSIVE DEBRIDEMENT, ROTATOR CUFF REPAIR ;  Surgeon: [name], MD;  Location: [place];  Service: Orthopedics;  Laterality: Right;    Current Medications: Outpatient Prescriptions Prior to Visit  Medication Sig Dispense Refill  . allopurinol (ZYLOPRIM) 300 MG tablet Take 300 mg by mouth daily.  0  . aspirin 81 MG tablet Take 81 mg by mouth daily.      Marland Kitchen atorvastatin (LIPITOR) 40 MG tablet Take 2 tablets (  80 mg total) by mouth daily. 180 tablet 0  . clopidogrel (PLAVIX) 75 MG tablet Take 1 tablet (75 mg total) by mouth daily. 90 tablet 3  . desloratadine (CLARINEX) 5 MG tablet Take 5 mg by mouth daily.    . diazepam (VALIUM) 5 MG tablet Take 1 tablet by mouth daily.    Marland Kitchen ezetimibe (ZETIA) 10 MG tablet Take 1 tablet (10 mg total) by mouth daily. 30 tablet 6  . folic acid (FOLVITE) 1 MG tablet as directed. Takes with chemo medication.    . furosemide (LASIX) 40 MG tablet Take 1 tablet (40 mg total) by mouth daily. 90 tablet 3  . gabapentin (NEURONTIN) 300 MG capsule Take 1 capsule (300 mg total) by mouth 3 (three) times daily. 90 capsule 1  .  HYDROmorphone (DILAUDID) 2 MG tablet Take 1 mg by mouth daily.    Marland Kitchen leflunomide (ARAVA) 20 MG tablet Take 20 mg by mouth daily.    . methocarbamol (ROBAXIN) 500 MG tablet Take 1 tablet (500 mg total) by mouth 4 (four) times daily. 75 tablet 1  . metoprolol (LOPRESSOR) 50 MG tablet take 1 tablet by mouth once daily 90 tablet 2  . NEXIUM 40 MG capsule 1 capsule as needed.    . nitroGLYCERIN (NITROSTAT) 0.4 MG SL tablet Place 1 tablet (0.4 mg total) under the tongue every 5 (five) minutes as needed for chest pain. 25 tablet 3  . Omega-3 Fatty Acids (FISH OIL) 1000 MG CAPS Take by mouth daily.      . predniSONE (DELTASONE) 5 MG tablet Take 10 mg by mouth daily with breakfast.     . PRESCRIPTION MEDICATION Take 5 mg by mouth daily. [name]    . ramipril (ALTACE) 2.5 MG capsule Take 1 capsule (2.5 mg total) by mouth daily. 90 capsule 3  . sennosides-docusate sodium (SENOKOT-S) 8.6-50 MG tablet Take 2 tablets by mouth daily. 30 tablet 1  . Tofacitinib Citrate (XELJANZ) 5 MG TABS Take 2 tablets by mouth.    . traMADol (ULTRAM) 50 MG tablet Take 1 tablet (50 mg total) by mouth 4 (four) times daily. 120 tablet 1  . zolpidem (AMBIEN) 5 MG tablet Take 5 mg by mouth at bedtime.  0   No facility-administered medications prior to visit.     Allergies:   Codeine phosphate; Amoxicillin; Penicillins; and Lidocaine   Social History   Social History  . Marital Status: Married    Spouse Name: N/A  . Number of Children: N/A  . Years of Education: N/A   Social History Main Topics  . Smoking status: Never Smoker   . Smokeless tobacco: Never Used  . Alcohol Use: No  . Drug Use: No  . Sexual Activity: No   Other Topics Concern  . None   Social History Narrative     Family History:  The patient's family history includes Arthritis/Rheumatoid in her father; Cancer in her maternal aunt, maternal grandfather, and maternal grandmother; Diabetes in her brother and sister; Heart attack in her maternal aunt,  maternal uncle, and mother; Heart disease in her brother, brother, maternal aunt, and mother; Other in her father.   ROS:   Please see the history of present illness.    ROS All other systems reviewed and are negative.   PHYSICAL EXAM:   VS:  BP 120/72 mmHg  Pulse 73  Ht 5\' 3"  (1.6 m)  Wt 218 lb (98.884 kg)  BMI 38.63 kg/m2   GEN: Morbidly obese, well developed, in  no acute distress HEENT: normal Neck: no JVD, carotid bruits, or masses Cardiac: RRR; no murmurs, rubs, or gallops,no edema  Respiratory:  clear to auscultation bilaterally, normal work of breathing GI: soft, nontender, nondistended, + BS MS: no deformity or atrophy Skin: warm and dry, no rash Neuro:  Alert and Oriented x 3, Strength and sensation are intact Psych: euthymic mood, full affect  Wt Readings from Last 3 Encounters:  11/13/15 218 lb (98.884 kg)  02/03/15 221 lb (100.245 kg)  01/17/15 221 lb 12.8 oz (100.608 kg)      Studies/Labs Reviewed:   EKG:  EKG is ordered today.  The ekg ordered today demonstrates Sinus rhythm, nonspecific ST-T wave abnormalities no significant change from last year.  Recent Labs: 03/21/2015: ALT 17   Lipid Panel    Component Value Date/Time   CHOL 147 03/21/2015 1151   TRIG 156.0* 03/21/2015 1151   HDL 47.30 03/21/2015 1151   CHOLHDL 3 03/21/2015 1151   VLDL 31.2 03/21/2015 1151   LDLCALC 69 03/21/2015 1151    Additional studies/ records that were reviewed today include:   Nuclear stress test 01/2015  Nuclear stress EF: 75%.  The left ventricular ejection fraction is hyperdynamic (>65%).  There was no ST segment deviation noted during stress.  The study is normal.  This is a low risk study. No evidence of ischemia or scar. Vigorous LV systolic function.    ASSESSMENT:    1. Essential hypertension   2. Hyperlipidemia   3. Coronary artery disease involving native coronary artery of native heart without angina pectoris   4. Other chest pain   5.  Hyperlipemia   6. Acute on chronic diastolic CHF (congestive heart failure), NYHA class 3 (HCC)      PLAN:  In order of problems listed above:  EKG unchanged from the one last year and her symptoms change, she had normal stress test in July 2016 no ischemic workup necessary at this time. She has stable symptoms of chronic diastolic CHF I'll continue same dose of Lasix 40 mg daily. In addition of Strattera on the days when she feels like her lower extremity edema is worse. She will have no indication undergoing orthopedic surgery. We will refill her medications change pharmacy to [facility]. We will check her lipids today and call her with the results.  Signed, Dorothy Spark, MD  11/13/2015 10:34 AM    [facility] [address], [place], [place]  [address] Phone: [phone]; Fax: [phone]

## 2015-11-13 NOTE — Patient Instructions (Signed)
Medication Instructions:   Your physician recommends that you continue on your current medications as directed. Please refer to the Current Medication list given to you today.    Labwork:  TODAY--CMET, CBC W DIFF, AND LIPIDS    Follow-Up:  Your physician wants you to follow-up in: ONE YEAR WITH DR NELSON You will receive a reminder letter in the mail two months in advance. If you don't receive a letter, please call our office to schedule the follow-up appointment.       If you need a refill on your cardiac medications before your next appointment, please call your pharmacy.   

## 2015-12-04 DIAGNOSIS — M549 Dorsalgia, unspecified: Secondary | ICD-10-CM | POA: Diagnosis not present

## 2015-12-04 DIAGNOSIS — M0579 Rheumatoid arthritis with rheumatoid factor of multiple sites without organ or systems involvement: Secondary | ICD-10-CM | POA: Diagnosis not present

## 2015-12-04 DIAGNOSIS — M109 Gout, unspecified: Secondary | ICD-10-CM | POA: Diagnosis not present

## 2015-12-04 DIAGNOSIS — M797 Fibromyalgia: Secondary | ICD-10-CM | POA: Diagnosis not present

## 2016-01-05 MED FILL — EZETIMIBE 10 MG TABLET: 10 | 30 days supply | Qty: 30 | Fill #1

## 2016-01-08 DIAGNOSIS — L820 Inflamed seborrheic keratosis: Secondary | ICD-10-CM | POA: Diagnosis not present

## 2016-01-08 DIAGNOSIS — L821 Other seborrheic keratosis: Secondary | ICD-10-CM | POA: Diagnosis not present

## 2016-01-08 DIAGNOSIS — L570 Actinic keratosis: Secondary | ICD-10-CM | POA: Diagnosis not present

## 2016-02-09 MED FILL — EZETIMIBE 10 MG TABLET: 10 | 30 days supply | Qty: 30 | Fill #2

## 2016-02-29 ENCOUNTER — Other Ambulatory Visit: Payer: Self-pay | Admitting: Cardiology

## 2016-03-04 MED FILL — ATORVASTATIN 40 MG TABLET: 40 | 30 days supply | Qty: 60 | Fill #1

## 2016-03-04 MED FILL — EZETIMIBE 10 MG TABLET: 10 | 30 days supply | Qty: 30 | Fill #3

## 2016-03-11 ENCOUNTER — Encounter (HOSPITAL_COMMUNITY): Payer: Self-pay | Admitting: Emergency Medicine

## 2016-03-11 ENCOUNTER — Emergency Department (HOSPITAL_COMMUNITY): Payer: Medicare Other

## 2016-03-11 ENCOUNTER — Telehealth: Payer: Self-pay | Admitting: Cardiology

## 2016-03-11 ENCOUNTER — Observation Stay (HOSPITAL_COMMUNITY)
Admission: EM | Admit: 2016-03-11 | Discharge: 2016-03-12 | Disposition: A | Discharge status: Home / Self Care | Payer: Medicare Other | Attending: Cardiology | Admitting: Cardiology

## 2016-03-11 DIAGNOSIS — R0789 Other chest pain: Secondary | ICD-10-CM | POA: Diagnosis not present

## 2016-03-11 DIAGNOSIS — I110 Hypertensive heart disease with heart failure: Secondary | ICD-10-CM | POA: Insufficient documentation

## 2016-03-11 DIAGNOSIS — I25110 Atherosclerotic heart disease of native coronary artery with unstable angina pectoris: Principal | ICD-10-CM | POA: Insufficient documentation

## 2016-03-11 DIAGNOSIS — M0579 Rheumatoid arthritis with rheumatoid factor of multiple sites without organ or systems involvement: Secondary | ICD-10-CM | POA: Diagnosis not present

## 2016-03-11 DIAGNOSIS — Z7902 Long term (current) use of antithrombotics/antiplatelets: Secondary | ICD-10-CM | POA: Insufficient documentation

## 2016-03-11 DIAGNOSIS — Z6841 Body Mass Index (BMI) 40.0 and over, adult: Secondary | ICD-10-CM | POA: Insufficient documentation

## 2016-03-11 DIAGNOSIS — Z7982 Long term (current) use of aspirin: Secondary | ICD-10-CM | POA: Diagnosis not present

## 2016-03-11 DIAGNOSIS — Z8249 Family history of ischemic heart disease and other diseases of the circulatory system: Secondary | ICD-10-CM | POA: Diagnosis not present

## 2016-03-11 DIAGNOSIS — E876 Hypokalemia: Secondary | ICD-10-CM | POA: Diagnosis not present

## 2016-03-11 DIAGNOSIS — I5032 Chronic diastolic (congestive) heart failure: Secondary | ICD-10-CM | POA: Diagnosis not present

## 2016-03-11 DIAGNOSIS — I200 Unstable angina: Secondary | ICD-10-CM

## 2016-03-11 DIAGNOSIS — E785 Hyperlipidemia, unspecified: Secondary | ICD-10-CM | POA: Diagnosis not present

## 2016-03-11 DIAGNOSIS — E7800 Pure hypercholesterolemia, unspecified: Secondary | ICD-10-CM

## 2016-03-11 DIAGNOSIS — R0602 Shortness of breath: Secondary | ICD-10-CM | POA: Diagnosis not present

## 2016-03-11 DIAGNOSIS — I10 Essential (primary) hypertension: Secondary | ICD-10-CM | POA: Diagnosis present

## 2016-03-11 DIAGNOSIS — M069 Rheumatoid arthritis, unspecified: Secondary | ICD-10-CM | POA: Diagnosis not present

## 2016-03-11 DIAGNOSIS — R079 Chest pain, unspecified: Secondary | ICD-10-CM

## 2016-03-11 DIAGNOSIS — Z8673 Personal history of transient ischemic attack (TIA), and cerebral infarction without residual deficits: Secondary | ICD-10-CM | POA: Diagnosis not present

## 2016-03-11 DIAGNOSIS — T82858A Stenosis of vascular prosthetic devices, implants and grafts, initial encounter: Secondary | ICD-10-CM | POA: Diagnosis not present

## 2016-03-11 DIAGNOSIS — Z7952 Long term (current) use of systemic steroids: Secondary | ICD-10-CM | POA: Insufficient documentation

## 2016-03-11 DIAGNOSIS — Y831 Surgical operation with implant of artificial internal device as the cause of abnormal reaction of the patient, or of later complication, without mention of misadventure at the time of the procedure: Secondary | ICD-10-CM | POA: Diagnosis not present

## 2016-03-11 DIAGNOSIS — Z79899 Other long term (current) drug therapy: Secondary | ICD-10-CM | POA: Insufficient documentation

## 2016-03-11 DIAGNOSIS — M109 Gout, unspecified: Secondary | ICD-10-CM | POA: Diagnosis not present

## 2016-03-11 DIAGNOSIS — M797 Fibromyalgia: Secondary | ICD-10-CM | POA: Diagnosis not present

## 2016-03-11 DIAGNOSIS — M549 Dorsalgia, unspecified: Secondary | ICD-10-CM | POA: Diagnosis not present

## 2016-03-11 LAB — COMPREHENSIVE METABOLIC PANEL
ALBUMIN: 4.3 g/dL (ref 3.5–5.0)
ALT: 19 U/L (ref 14–54)
AST: 15 U/L (ref 15–41)
Alkaline Phosphatase: 82 U/L (ref 38–126)
Anion gap: 10 (ref 5–15)
BILIRUBIN TOTAL: 0.3 mg/dL (ref 0.3–1.2)
BUN: 11 mg/dL (ref 6–20)
CHLORIDE: 102 mmol/L (ref 101–111)
CO2: 26 mmol/L (ref 22–32)
CREATININE: 0.71 mg/dL (ref 0.44–1.00)
Calcium: 9.4 mg/dL (ref 8.9–10.3)
GFR calc Af Amer: >60 mL/min (ref >60)
GFR calc non Af Amer: >60 mL/min (ref >60)
GLUCOSE: 118 mg/dL — AB (ref 65–99)
POTASSIUM: 3.3 mmol/L — AB (ref 3.5–5.1)
Sodium: 138 mmol/L (ref 135–145)
Total Protein: 7.4 g/dL (ref 6.5–8.1)

## 2016-03-11 LAB — CBC WITH DIFFERENTIAL/PLATELET
BASOS ABS: 0 10*3/uL (ref 0.0–0.1)
BASOS PCT: 0 %
Eosinophils Absolute: 0.2 10*3/uL (ref 0.0–0.7)
Eosinophils Relative: 2 %
HEMATOCRIT: 43.3 % (ref 36.0–46.0)
Hemoglobin: 13.9 g/dL (ref 12.0–15.0)
LYMPHS PCT: 23 %
Lymphs Abs: 2.3 10*3/uL (ref 0.7–4.0)
MCH: 29.4 pg (ref 26.0–34.0)
MCHC: 32.1 g/dL (ref 30.0–36.0)
MCV: 91.7 fL (ref 78.0–100.0)
MONO ABS: 0.4 10*3/uL (ref 0.1–1.0)
Monocytes Relative: 4 %
NEUTROS ABS: 7 10*3/uL (ref 1.7–7.7)
NEUTROS PCT: 71 %
Platelets: 299 10*3/uL (ref 150–400)
RBC: 4.72 MIL/uL (ref 3.87–5.11)
RDW: 14.4 % (ref 11.5–15.5)
WBC: 9.9 10*3/uL (ref 4.0–10.5)

## 2016-03-11 LAB — I-STAT TROPONIN, ED: Troponin i, poc: 0 ng/mL (ref 0.00–0.08)

## 2016-03-11 LAB — PROTIME-INR
INR: 1.02
Prothrombin Time: 13.4 s (ref 11.4–15.2)

## 2016-03-11 LAB — TSH: TSH: 2.398 u[IU]/mL (ref 0.350–4.500)

## 2016-03-11 LAB — TROPONIN I

## 2016-03-11 MED ORDER — HEPARIN (PORCINE) IN NACL 100-0.45 UNIT/ML-% IJ SOLN
1000.0000 [IU]/h | INTRAMUSCULAR | Status: DC
  Administered 2016-03-11: 900 [IU]/h via INTRAVENOUS
  Filled 2016-03-11: qty 250

## 2016-03-11 MED ORDER — ASPIRIN 81 MG PO CHEW
81.0000 mg | CHEWABLE_TABLET | Freq: Every day | ORAL | Status: DC
  Administered 2016-03-11: 81 mg via ORAL

## 2016-03-11 MED ORDER — LORATADINE 10 MG PO TABS
10.0000 mg | ORAL_TABLET | Freq: Every day | ORAL | Status: DC
  Administered 2016-03-11 – 2016-03-12 (×2): 10 mg via ORAL
  Filled 2016-03-11 (×2): qty 1

## 2016-03-11 MED ORDER — EZETIMIBE 10 MG PO TABS
10.0000 mg | ORAL_TABLET | Freq: Every day | ORAL | Status: DC
Start: 2016-03-11 — End: 2016-03-12
  Administered 2016-03-11 – 2016-03-12 (×2): 10 mg via ORAL
  Filled 2016-03-11 (×2): qty 1

## 2016-03-11 MED ORDER — ALLOPURINOL 300 MG PO TABS
300.0000 mg | ORAL_TABLET | Freq: Every day | ORAL | Status: DC
  Administered 2016-03-11 – 2016-03-12 (×2): 300 mg via ORAL
  Filled 2016-03-11 (×2): qty 1

## 2016-03-11 MED ORDER — ASPIRIN 81 MG PO CHEW: 81.0000 mg | CHEWABLE_TABLET | ORAL | Status: DC

## 2016-03-11 MED ORDER — ACETAMINOPHEN 325 MG PO TABS
650.0000 mg | ORAL_TABLET | ORAL | Status: DC | PRN
  Administered 2016-03-12: 650 mg via ORAL
  Filled 2016-03-11: qty 2

## 2016-03-11 MED ORDER — CLOPIDOGREL BISULFATE 75 MG PO TABS
75.0000 mg | ORAL_TABLET | Freq: Every day | ORAL | Status: DC
  Administered 2016-03-11 – 2016-03-12 (×2): 75 mg via ORAL
  Filled 2016-03-11 (×2): qty 1

## 2016-03-11 MED ORDER — LEFLUNOMIDE 20 MG PO TABS
20.0000 mg | ORAL_TABLET | Freq: Every day | ORAL | Status: DC
  Administered 2016-03-11 – 2016-03-12 (×2): 20 mg via ORAL
  Filled 2016-03-11 (×2): qty 1

## 2016-03-11 MED ORDER — SODIUM CHLORIDE 0.9% FLUSH: 3.0000 mL | INTRAVENOUS | Status: DC | PRN

## 2016-03-11 MED ORDER — SENNOSIDES-DOCUSATE SODIUM 8.6-50 MG PO TABS
1.0000 | ORAL_TABLET | Freq: Every day | ORAL | Status: DC
  Administered 2016-03-11: 1 via ORAL
  Filled 2016-03-11 (×2): qty 1

## 2016-03-11 MED ORDER — PANTOPRAZOLE SODIUM 40 MG PO TBEC
40.0000 mg | DELAYED_RELEASE_TABLET | Freq: Every day | ORAL | Status: DC
  Administered 2016-03-11 – 2016-03-12 (×2): 40 mg via ORAL
  Filled 2016-03-11 (×2): qty 1

## 2016-03-11 MED ORDER — TRAMADOL HCL 50 MG PO TABS: 50.0000 mg | ORAL_TABLET | Freq: Four times a day (QID) | ORAL | Status: DC | PRN

## 2016-03-11 MED ORDER — SODIUM CHLORIDE 0.9 % IV SOLN
250.0000 mL | INTRAVENOUS | Status: DC | PRN
Start: 2016-03-11 — End: 2016-03-12

## 2016-03-11 MED ORDER — ASPIRIN EC 325 MG PO TBEC
325.0000 mg | DELAYED_RELEASE_TABLET | Freq: Once | ORAL | Status: AC
  Administered 2016-03-11: 325 mg via ORAL
  Filled 2016-03-11: qty 1

## 2016-03-11 MED ORDER — DIAZEPAM 5 MG PO TABS: 5.0000 mg | ORAL_TABLET | Freq: Every day | ORAL | Status: DC | PRN

## 2016-03-11 MED ORDER — ASPIRIN 81 MG PO CHEW
81.0000 mg | CHEWABLE_TABLET | Freq: Every day | ORAL | Status: DC
  Administered 2016-03-12: 81 mg via ORAL
  Filled 2016-03-11 (×2): qty 1

## 2016-03-11 MED ORDER — OMEGA-3-ACID ETHYL ESTERS 1 G PO CAPS
2.0000 g | ORAL_CAPSULE | Freq: Every day | ORAL | Status: DC
  Administered 2016-03-11: 2 g via ORAL
  Filled 2016-03-11 (×2): qty 2

## 2016-03-11 MED ORDER — METOPROLOL TARTRATE 50 MG PO TABS
50.0000 mg | ORAL_TABLET | Freq: Two times a day (BID) | ORAL | Status: DC
  Administered 2016-03-11 – 2016-03-12 (×2): 50 mg via ORAL
  Filled 2016-03-11 (×2): qty 1

## 2016-03-11 MED ORDER — HEPARIN BOLUS VIA INFUSION
4000.0000 [IU] | Freq: Once | INTRAVENOUS | Status: AC
  Administered 2016-03-11: 4000 [IU] via INTRAVENOUS
  Filled 2016-03-11: qty 4000

## 2016-03-11 MED ORDER — SODIUM CHLORIDE 0.9 % IV SOLN: INTRAVENOUS | Status: DC

## 2016-03-11 MED ORDER — RAMIPRIL 2.5 MG PO CAPS
2.5000 mg | ORAL_CAPSULE | Freq: Every day | ORAL | Status: DC
  Administered 2016-03-11 – 2016-03-12 (×2): 2.5 mg via ORAL
  Filled 2016-03-11 (×2): qty 1

## 2016-03-11 MED ORDER — ZOLPIDEM TARTRATE 5 MG PO TABS
5.0000 mg | ORAL_TABLET | Freq: Every day | ORAL | Status: DC
  Administered 2016-03-11: 5 mg via ORAL
  Filled 2016-03-11: qty 1

## 2016-03-11 MED ORDER — FUROSEMIDE 40 MG PO TABS
40.0000 mg | ORAL_TABLET | Freq: Every day | ORAL | Status: DC
Start: 2016-03-11 — End: 2016-03-12

## 2016-03-11 MED ORDER — METHOCARBAMOL 500 MG PO TABS
500.0000 mg | ORAL_TABLET | Freq: Four times a day (QID) | ORAL | Status: DC
  Administered 2016-03-11: 500 mg via ORAL
  Filled 2016-03-11 (×2): qty 1

## 2016-03-11 MED ORDER — GABAPENTIN 300 MG PO CAPS
300.0000 mg | ORAL_CAPSULE | Freq: Three times a day (TID) | ORAL | Status: DC
  Administered 2016-03-11: 300 mg via ORAL
  Filled 2016-03-11 (×2): qty 1

## 2016-03-11 MED ORDER — NITROGLYCERIN 0.4 MG SL SUBL: 0.4000 mg | SUBLINGUAL_TABLET | SUBLINGUAL | Status: DC | PRN

## 2016-03-11 MED ORDER — SODIUM CHLORIDE 0.9% FLUSH
3.0000 mL | Freq: Two times a day (BID) | INTRAVENOUS | Status: DC
  Administered 2016-03-11: 3 mL via INTRAVENOUS

## 2016-03-11 MED ORDER — PREDNISONE 10 MG PO TABS: 10.0000 mg | ORAL_TABLET | Freq: Every day | ORAL | Status: DC

## 2016-03-11 MED ORDER — ATORVASTATIN CALCIUM 80 MG PO TABS
80.0000 mg | ORAL_TABLET | Freq: Every day | ORAL | Status: DC
  Administered 2016-03-11: 80 mg via ORAL
  Filled 2016-03-11: qty 1

## 2016-03-11 MED ORDER — SODIUM CHLORIDE 0.9% FLUSH
3.0000 mL | Freq: Two times a day (BID) | INTRAVENOUS | Status: DC
  Administered 2016-03-11 – 2016-03-12 (×2): 3 mL via INTRAVENOUS

## 2016-03-11 MED ORDER — POTASSIUM CHLORIDE CRYS ER 20 MEQ PO TBCR
40.0000 meq | EXTENDED_RELEASE_TABLET | Freq: Once | ORAL | Status: AC
  Administered 2016-03-11: 40 meq via ORAL
  Filled 2016-03-11: qty 2

## 2016-03-11 MED ORDER — ONDANSETRON HCL 4 MG/2ML IJ SOLN: 4.0000 mg | Freq: Four times a day (QID) | INTRAMUSCULAR | Status: DC | PRN

## 2016-03-11 MED ORDER — FOLIC ACID 1 MG PO TABS
1.0000 mg | ORAL_TABLET | Freq: Every day | ORAL | Status: DC
  Administered 2016-03-11 – 2016-03-12 (×2): 1 mg via ORAL
  Filled 2016-03-11 (×2): qty 1

## 2016-03-11 MED ORDER — SODIUM CHLORIDE 0.9 % IV SOLN: 250.0000 mL | INTRAVENOUS | Status: DC | PRN

## 2016-03-11 NOTE — Progress Notes (Signed)
ANTICOAGULATION CONSULT NOTE - Initial Consult  Pharmacy Consult for heparin Indication: ACS  Allergies  Allergen Reactions  . Codeine Phosphate Shortness Of Breath    REACTION: unspecified  . Amoxicillin Nausea And Vomiting    REACTION: unspecified  . Penicillins Nausea And Vomiting    Has patient had a PCN reaction causing immediate rash, facial/tongue/throat swelling, SOB or lightheadedness with hypotension:YES Has patient had a PCN reaction causing severe rash involving mucus membranes or skin necrosis: NO Has patient had a PCN reaction that required hospitalization NO Has patient had a PCN reaction occurring within the last 10 years: NO If all of the above answers are "NO", then may proceed with Cephalosporin use.  . Lidocaine Rash    Patch caused rash    Patient Measurements: Height: 5\' 2"  (157.5 cm) Weight: 220 lb (99.8 kg) IBW/kg (Calculated) : 50.1 Heparin Dosing Weight: 74 kg  Vital Signs: Temp: 98.6 F (37 C) (08/31 1511) Temp Source: Oral (08/31 1511) BP: 147/67 (08/31 2013) Pulse Rate: 87 (08/31 2100)  Labs:  Recent Labs  03/11/16 1513  HGB 13.9  HCT 43.3  PLT 299  CREATININE 0.71    Estimated Creatinine Clearance: 78.5 mL/min (by C-G formula based on SCr of 0.8 mg/dL).   Medical History: Past Medical History:  Diagnosis Date  . CAD (coronary artery disease)    PTCA LAD 2001/cath 2002-prox and mid LAD stents patent, PTCA ostium Dx 1/ nuclear 7/09. resting ST's become worse, no scar or ischemia. nuclear 11/18/09, ST changes with walking, no chest pain, anterior breat attenuation with no ischemia  . Carotid artery disease ([place])    Doppler September, 2011, 0-39% bilateral disease mild  . Edema   . Emotional stress reaction    8/11  . HTN (hypertension)   . Hyperlipidemia   . Leg pain    July, 2012, Arterial Dopplers March 08, 2011 normal  . 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 Overweight(278.02)    laproscopic  adjustable gastric banding with APS standard system  . PONV (postoperative nausea and vomiting)   . Rheumatoid arthritis ([place])   . Right rotator cuff tear 11/16/2013  . Stroke [facility])    mini stroke in past ???    Assessment: 52 YOF with hx of CAD, admitted with chest pain. Pharmacy is consulted to start IV heparin, plan for Cath tomorrow. Pt is not on anticoagulation prior to admission, baseline CBC wnl.  Goal of Therapy:  Heparin level 0.3-0.7 units/ml Monitor platelets by anticoagulation protocol: Yes   Plan:  - Heparin bolus 4000 units x 1 - Heparin infusion 900 units/hr - f/u 6 hr heparin level at 0400 - Daily heparin level and CBC - f/u cath  Maryanna Shape, PharmD, BCPS  Clinical Pharmacist  Pager: [phone]  03/11/2016,9:31 PM

## 2016-03-11 NOTE — Telephone Encounter (Signed)
Will route this message to Dr Meda Coffee as an fyi,that pt was referred to the ER for evaluation of chest pain.

## 2016-03-11 NOTE — H&P (Signed)
Patient ID: Cindy Holmes MRN: [id number], DOB/AGE: 02/17/1951   Admit date: 03/11/2016   Primary Physician: Leonides Grills, MD Primary Cardiologist: Dr. Meda Coffee Reason for admission: chest pain  Pt. Profile:  Cindy Holmes is a 65 y.o. female with a history of CAD s/p previous stenting, HTN, HLD, morbid obesity, chronic diastolic CHF and RA who presented to [facility] today with chest pain.   She had a cardiac catheterization with PTCA of her LAD and stent placement in the mid LAD in 2001. She had PTCA ostium Dx in 2002. She had follow-up stress test in July 2016 for atypical chest pain that showed hyperdynamic LVEF and no evidence of prior scar or ischemia.   She saw Dr. Meda Coffee in 11/2015 for follow up. She complained of occasional and atypical not related to exertion.  Patient reports that over the last week she's had some left sided chest pain and shortness of breath. Patient reports the pain feels like a "ache" and has had radiation into her left shoulder. She denies any associated nausea or vomiting. She denies any exertional component. She reports tenderness to palpation of the chest wall, even when her cat lays on her. She recently had some extra fluid and took extra lasix and lost 8lbs. She just knows something is not right and has a very extensive family hx of CAD. She "knows her body and knows something is wrong with her heart and she is usually right."   Problem List  Past Medical History:  Diagnosis Date  . CAD (coronary artery disease)    PTCA LAD 2001/cath 2002-prox and mid LAD stents patent, PTCA ostium Dx 1/ nuclear 7/09. resting ST's become worse, no scar or ischemia. nuclear 11/18/09, ST changes with walking, no chest pain, anterior breat attenuation with no ischemia  . Carotid artery disease ([place])    Doppler September, 2011, 0-39% bilateral disease mild  . Edema   . Emotional stress reaction    8/11  . HTN (hypertension)   . Hyperlipidemia   . Leg pain    July, 2012, Arterial Dopplers March 08, 2011 normal  . 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 PONV (postoperative nausea and vomiting)   . Rheumatoid arthritis ([place])   . Right rotator cuff tear 11/16/2013  . Stroke [facility])    mini stroke in past ???    Past Surgical History:  Procedure Laterality Date  . CARDIAC CATHETERIZATION     with stent placement in 2001  . CHOLECYSTECTOMY    . CORONARY ANGIOPLASTY     2002  . laproscopic adjustable gastric  banding     with APS standard system.   . Pt has 3 stents     sept 2001  . SHOULDER ARTHROSCOPY WITH SUBACROMIAL DECOMPRESSION, ROTATOR CUFF REPAIR AND BICEP TENDON REPAIR Right 11/16/2013   Procedure: RIGHT SHOULDER ARTHROSCOPY, EXTENSIVE DEBRIDEMENT, ROTATOR CUFF REPAIR ;  Surgeon: [name], MD;  Location: [facility];  Service: Orthopedics;  Laterality: Right;  . TUBAL LIGATION       Allergies  Allergies  Allergen Reactions  . Codeine Phosphate Shortness Of Breath    REACTION: unspecified  . Amoxicillin Nausea And Vomiting    REACTION: unspecified  . Penicillins Nausea And Vomiting    Has patient had a PCN reaction causing immediate rash, facial/tongue/throat swelling, SOB or lightheadedness with hypotension:YES Has patient  had a PCN reaction causing severe rash involving mucus membranes or skin necrosis: NO Has patient had a PCN reaction that required hospitalization NO Has patient had a PCN reaction occurring within the last 10 years: NO If all of the above answers are "NO", then may proceed with Cephalosporin use.  . Lidocaine Rash    Patch caused rash     Home Medications  Prior to Admission medications   Medication Sig Start Date End Date Taking? Authorizing Provider  allopurinol (ZYLOPRIM) 300 MG tablet Take 300 mg by mouth daily. 01/07/15  Yes Historical Provider, MD  aspirin 81 MG  tablet Take 81 mg by mouth daily.     Yes Historical Provider, MD  atorvastatin (LIPITOR) 40 MG tablet Take 2 tablets (80 mg total) by mouth daily. 11/13/15  Yes Dorothy Spark, MD  clopidogrel (PLAVIX) 75 MG tablet Take 1 tablet (75 mg total) by mouth daily. 11/13/15  Yes Dorothy Spark, MD  desloratadine (CLARINEX) 5 MG tablet Take 5 mg by mouth daily.   Yes Historical Provider, MD  diazepam (VALIUM) 5 MG tablet Take 1 tablet by mouth daily. 02/25/14  Yes Historical Provider, MD  ezetimibe (ZETIA) 10 MG tablet Take 1 tablet (10 mg total) by mouth daily. 11/13/15  Yes Dorothy Spark, MD  folic acid (FOLVITE) 1 MG tablet Take 1 mg by mouth daily. Takes with chemo medication. 03/05/14  Yes Historical Provider, MD  furosemide (LASIX) 40 MG tablet Take 1 tablet (40 mg total) by mouth daily. 11/13/15  Yes Dorothy Spark, MD  gabapentin (NEURONTIN) 300 MG capsule Take 1 capsule (300 mg total) by mouth 3 (three) times daily. 06/27/14  Yes Charlett Blake, MD  leflunomide (ARAVA) 20 MG tablet Take 20 mg by mouth daily.   Yes Historical Provider, MD  metoprolol (LOPRESSOR) 50 MG tablet take 1 tablet by mouth once daily 11/13/15  Yes Dorothy Spark, MD  NEXIUM 40 MG capsule Take 1 capsule by mouth daily as needed. For acid reflux 03/12/14  Yes Historical Provider, MD  nitroGLYCERIN (NITROSTAT) 0.4 MG SL tablet Place 1 tablet (0.4 mg total) under the tongue every 5 (five) minutes as needed for chest pain. 06/02/15  Yes Carlena Bjornstad, MD  Omega-3 Fatty Acids (FISH OIL) 1000 MG CAPS Take by mouth daily.     Yes Historical Provider, MD  predniSONE (DELTASONE) 5 MG tablet Take 10 mg by mouth daily with breakfast.    Yes Historical Provider, MD  ramipril (ALTACE) 2.5 MG capsule Take 1 capsule (2.5 mg total) by mouth daily. 11/13/15  Yes Dorothy Spark, MD  sennosides-docusate sodium (SENOKOT-S) 8.6-50 MG tablet Take 2 tablets by mouth daily. 11/16/13  Yes Marchia Bond, MD  Tofacitinib Citrate (XELJANZ) 5 MG  TABS Take 2 tablets by mouth.   Yes Historical Provider, MD  traMADol (ULTRAM) 50 MG tablet Take 1 tablet (50 mg total) by mouth 4 (four) times daily. 06/27/14  Yes Charlett Blake, MD  zolpidem (AMBIEN) 5 MG tablet Take 5 mg by mouth at bedtime. 12/13/14  Yes Historical Provider, MD  methocarbamol (ROBAXIN) 500 MG tablet Take 1 tablet (500 mg total) by mouth 4 (four) times daily. Patient not taking: Reported on 03/11/2016 11/16/13   Marchia Bond, MD    Family History  Family History  Problem Relation Age of Onset  . Heart attack Mother   . Heart disease Mother   . Diabetes Brother   . Heart disease Brother   .  Diabetes Sister   . Arthritis/Rheumatoid Father   . Other Father     hepatic cirrhosis  . Heart disease Maternal Aunt   . Cancer Maternal Aunt     throat  . Heart attack Maternal Aunt   . Heart attack Maternal Uncle   . Heart disease Brother   . Cancer      family history  . Heart attack      family history  . Cancer Maternal Grandmother     colon  . Cancer Maternal Grandfather     throat   Family Status  Relation Status  . Mother Deceased  . Brother Alive  . Sister Alive  . Father Deceased  . Maternal Aunt Deceased  . Maternal Uncle Deceased  . Brother Deceased  .    Marland Kitchen Maternal Grandmother   . Maternal Grandfather      Social History  Social History   Social History  . Marital status: Married    Spouse name: N/A  . Number of children: N/A  . Years of education: N/A   Occupational History  . Not on file.   Social History Main Topics  . Smoking status: Never Smoker  . Smokeless tobacco: Never Used  . Alcohol use No  . Drug use: No  . Sexual activity: No   Other Topics Concern  . Not on file   Social History Narrative  . No narrative on file     Review of Systems General:  No chills, fever, night sweats or weight changes.  Cardiovascular:  ++chest pain,++ dyspnea on exertion, ++edema, No orthopnea, palpitations, paroxysmal nocturnal  dyspnea. Dermatological: No rash, lesions/masses Respiratory: No cough, dyspnea Urologic: No hematuria, dysuria Abdominal:   No nausea, vomiting, diarrhea, bright red blood per rectum, melena, or hematemesis Neurologic:  No visual changes, wkns, changes in mental status. All other systems reviewed and are otherwise negative except as noted above.  Physical Exam  Blood pressure 146/60, pulse 77, temperature 98.6 F (37 C), temperature source Oral, resp. rate 14, height 5\' 2"  (1.575 m), weight 220 lb (99.8 kg), SpO2 100 %.  General: Pleasant, NAD Psych: Normal affect. Neuro: Alert and oriented X 3. Moves all extremities spontaneously. HEENT: Normal  Neck: Supple without bruits or JVD. Lungs:  Resp regular and unlabored, CTA. Heart: RRR no s3, s4, or murmurs. Abdomen: Soft, non-tender, non-distended, BS + x 4.  Extremities: No clubbing, cyanosis or edema. DP/PT/Radials 2+ and equal bilaterally.  Labs  No results for input(s): CKTOTAL, CKMB, TROPONINI in the last 72 hours. Lab Results  Component Value Date   WBC 9.9 03/11/2016   HGB 13.9 03/11/2016   HCT 43.3 03/11/2016   MCV 91.7 03/11/2016   PLT 299 03/11/2016    Recent Labs Lab 03/11/16 1513  NA 138  K 3.3*  CL 102  CO2 26  BUN 11  CREATININE 0.71  CALCIUM 9.4  PROT 7.4  BILITOT 0.3  ALKPHOS 82  ALT 19  AST 15  GLUCOSE 118*   Lab Results  Component Value Date   CHOL 199 11/13/2015   HDL 61 11/13/2015   LDLCALC 81 11/13/2015   TRIG 287 (H) 11/13/2015   No results found for: DDIMER   Radiology/Studies  Dg Chest 2 View  Result Date: 03/11/2016 CLINICAL DATA:  One-week history of left-sided chest pain and shortness of breath. Left upper extremity tingling which began today. Current history of hypertension. EXAM: CHEST  2 VIEW COMPARISON:  02/27/2007. FINDINGS: Cardiac silhouette normal in size,  unchanged. LAD coronary stent. Thoracic aorta mildly tortuous and atherosclerotic. Hilar and mediastinal contours  otherwise unremarkable. Scarring involving the right middle lobe, unchanged. Lungs otherwise clear. No pleural effusions. Visualized bony thorax intact. Laparoscopic band which is appropriately oriented in the 2:00 to 8:00 position. IMPRESSION: No acute cardiopulmonary disease. Electronically Signed   By: Evangeline Dakin M.D.   On: 03/11/2016 15:43   2001 1. High-speed rotational atherectomy followed by percutaneous transluminal    coronary angioplasty and stent placement of the proximal left anterior    descending artery. 2. High-speed rotational atherectomy followed by percutaneous transluminal    coronary angioplasty of the first diagonal ostium. 3. Percutaneous transluminal coronary angioplasty followed by stent placement    in the mid left anterior descending artery.  2002 IMPRESSIONS: 1. Normal left ventricular systolic function. 2. One-vessel coronary artery disease characterized by a 95% area of    restenosis in the ostium of the first diagonal branch.  The stents in the    proximal and mid LAD are patent. PLAN:  Percutaneous intervention to the first diagonal.  See below. RESULTS:  Successful PTCA of the ostium of the first diagonal branch reducing a 95% stenosis to less than 20% residual with TIMI-3 flow. PLAN:  Integrilin will be continued for 18 hours.  Plavix will be administered for four weeks.  Carotid dopplers 2016 Heterogeneous plaque, bilaterally. Stable 1-39% bilateral ICA stenosis. Normal subclavian arteries, bilaterally. Patent vertebral arteries with antegrade flow.   ECG  NSR HR 82 with non specific ST/TW changes that are similar to previous, maybe a little more pronounced inferiorly   ASSESSMENT AND PLAN  Chest pain: she has had worsening dyspnea on exertion as well as some atypical chest pain that is sore to touch. She feels like she just knows her body and "something is not normal." She has learned her body and knows something isn't right. She recently  had a low risk nuc about 1 year ago. She has a history of atypical presentation, so we will plan for cardiac cath to definitively rule out ischemia.   We have reviewed the risks, indications, and alternatives to cardiac catheterization and possible angioplasty/stenting with the patient. Risks include but are not limited to bleeding, infection, vascular injury, stroke, myocardial infection, arrhythmia, kidney injury, radiation-related injury in the case of prolonged fluoroscopy use, emergency cardiac surgery, and death. The patient understands the risks of serious complication is low (123456).   CAD: s/p PTCA of her LAD and stent placement in the mid LAD in 2001. She had PTCA ostium Dx in 2002. Continue ASA, statin and BB.   HTN: BP well controlled on current regimen   HLD: LDL down to 81 on atorva 80mg  daily and Zetia 10mg  daily.   Hypokalemia: likely 2/2 increase lasix. Will supplement  Chronic diastolic CHF: she appears euvolemic. Will continue home lasix.   Carotid artery stenosis: 1-39% bilateral ICA stenosis. Due for repeat dopplers in 1 year.   Mable Fill, PA-C 03/11/2016, 5:47 PM  Pager [phone] Patient seen and examined and history reviewed. Agree with above findings and plan. 65 yo WF with history of premature CAD s/p rotational atherectomy and stenting of the LAD in 2001 and POBA of the diagonal in 2002. Last myoview in July 2016 was normal. Now presents with symptoms of intermittent dyspnea and left precordial chest pain. She states it is not as bad as before her stent but she feels that something is not right. She is compliant with meds. Very strong  family history of premature CAD.  On exam she is normotensive. Lungs are clear. CV without gallop or murmur Pulses 2+ no edema. She does have chest wall tenderness to palpation in the left anterior chest  Troponin is negative. Ecg shows Inferolateral ST-T wave changes. The inferior changes appear somewhat more pronounced  than before.   Impression: Chest pain with both typical and atypical features. She is s/p PCI over 16 years ago. She is convinced something is wrong with her and needs to be evaluated. We discussed potential evaluation with stress test versus cardiac cath. She would prefer the more certain results of a cardiac cath. I think this is a reasonable approach. The procedure and risks were reviewed including but not limited to death, myocardial infarction, stroke, arrythmias, bleeding, transfusion, emergency surgery, dye allergy, or renal dysfunction. The patient voices understanding and is agreeable to proceed. Will admit overnight. Cycle enzymes. Start IV heparin. Replete potassium. Will place on cath schedule for tomorrow.   Peter Martinique, [place] 03/11/2016 6:28 PM

## 2016-03-11 NOTE — ED Notes (Signed)
Attempted to call report at this time, per secretary nurse is unavailable. Will call me back, number provided.

## 2016-03-11 NOTE — Telephone Encounter (Signed)
New message    Pt c/o of Chest Pain: STAT if CP now or developed within 24 hours  1. Are you having CP right now? Yes, its a dull pain  2. Are you experiencing any other symptoms (ex. SOB, nausea, vomiting, sweating)? At times when she walks she gets SOB  3. How long have you been experiencing CP? week  4. Is your CP continuous or coming and going?  coming and going  5. Have you taken Nitroglycerin? Yes, 8/30 at night, it did not help

## 2016-03-11 NOTE — ED Notes (Signed)
PA at bedside.

## 2016-03-11 NOTE — ED Provider Notes (Signed)
[facility] DEPT Provider Note   CSN: [id number] Arrival date & time: 03/11/16  1502     History   Chief Complaint Chief Complaint  Patient presents with  . Chest Pain    HPI Cindy Holmes is a 65 y.o. female.  HPI   65 year old female presents today with chest pain. Patient is seen by Dr. Meda Coffee, previously seen by Dr. Ron Parker. Patient has a history of coronary artery disease, hypertension, hyperlipidemia. She had a history of cardiac catheterization and stent. Patient most recently had myocardial perfusion imaging on 02/03/2015.   Patient reports that over the last week she's had some left sided chest pain and shortness of breath. Patient reports the pain feels like a "ache" and has had radiation into her left shoulder. She denies any associated nausea or vomiting. She denies any exertional component. Patient endorses some minor tenderness to palpation of the chest wall. Patient notes that she has had a slight increase in her lower extremity edema, she notes this is not abnormal for her. She denies any recent illness including upper respiratory infection.   Past Medical History:  Diagnosis Date  . CAD (coronary artery disease)    PTCA LAD 2001/cath 2002-prox and mid LAD stents patent, PTCA ostium Dx 1/ nuclear 7/09. resting ST's become worse, no scar or ischemia. nuclear 11/18/09, ST changes with walking, no chest pain, anterior breat attenuation with no ischemia  . Carotid artery disease ([place])    Doppler September, 2011, 0-39% bilateral disease mild  . Edema   . Emotional stress reaction    8/11  . HTN (hypertension)   . Hyperlipidemia   . Leg pain    July, 2012, Arterial Dopplers March 08, 2011 normal  . 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 PONV (postoperative nausea and vomiting)   . Rheumatoid arthritis ([place])   . Right rotator cuff tear 11/16/2013  .  Stroke [facility])    mini stroke in past ???    Patient Active Problem List   Diagnosis Date Noted  . Coronary artery disease involving native coronary artery of native heart without angina pectoris 11/13/2015  . Chest pain 11/13/2015  . Hyperlipemia 11/13/2015  . Acute on chronic diastolic CHF (congestive heart failure), NYHA class 3 ([place]) 11/13/2015  . Pain in joint, shoulder region 01/16/2014  . Muscle weakness (generalized) 01/16/2014  . Decreased range of motion of right shoulder 01/16/2014  . Right rotator cuff tear 11/16/2013  . Rotator cuff tear 11/16/2013  . Rheumatoid arthritis ([place])   . CAD (coronary artery disease)   . HTN (hypertension)   . Hyperlipidemia   . Stroke ([place])   . Emotional stress reaction   . Leg pain   . Carotid artery disease ([place])   . OVERWEIGHT/OBESITY 11/04/2008  . EDEMA 11/04/2008    Past Surgical History:  Procedure Laterality Date  . CARDIAC CATHETERIZATION     with stent placement in 2001  . CHOLECYSTECTOMY    . CORONARY ANGIOPLASTY     2002  . laproscopic adjustable gastric  banding     with APS standard system.   . Pt has 3 stents     sept 2001  . SHOULDER ARTHROSCOPY WITH SUBACROMIAL DECOMPRESSION, ROTATOR CUFF REPAIR AND BICEP TENDON REPAIR Right 11/16/2013   Procedure: RIGHT SHOULDER ARTHROSCOPY, EXTENSIVE DEBRIDEMENT, ROTATOR CUFF REPAIR ;  Surgeon: [name], MD;  Location: [facility];  Service: Orthopedics;  Laterality: Right;  . TUBAL LIGATION      OB History    No data available       Home Medications    Prior to Admission medications   Medication Sig Start Date End Date Taking? Authorizing Provider  allopurinol (ZYLOPRIM) 300 MG tablet Take 300 mg by mouth daily. 01/07/15  Yes Historical Provider, MD  aspirin 81 MG tablet Take 81 mg by mouth daily.     Yes Historical Provider, MD  atorvastatin (LIPITOR) 40 MG tablet Take 2 tablets (80 mg total) by mouth daily. 11/13/15  Yes Dorothy Spark, MD    clopidogrel (PLAVIX) 75 MG tablet Take 1 tablet (75 mg total) by mouth daily. 11/13/15  Yes Dorothy Spark, MD  desloratadine (CLARINEX) 5 MG tablet Take 5 mg by mouth daily.   Yes Historical Provider, MD  diazepam (VALIUM) 5 MG tablet Take 1 tablet by mouth daily. 02/25/14  Yes Historical Provider, MD  ezetimibe (ZETIA) 10 MG tablet Take 1 tablet (10 mg total) by mouth daily. 11/13/15  Yes Dorothy Spark, MD  folic acid (FOLVITE) 1 MG tablet Take 1 mg by mouth daily. Takes with chemo medication. 03/05/14  Yes Historical Provider, MD  furosemide (LASIX) 40 MG tablet Take 1 tablet (40 mg total) by mouth daily. 11/13/15  Yes Dorothy Spark, MD  gabapentin (NEURONTIN) 300 MG capsule Take 1 capsule (300 mg total) by mouth 3 (three) times daily. 06/27/14  Yes Charlett Blake, MD  leflunomide (ARAVA) 20 MG tablet Take 20 mg by mouth daily.   Yes Historical Provider, MD  metoprolol (LOPRESSOR) 50 MG tablet take 1 tablet by mouth once daily 11/13/15  Yes Dorothy Spark, MD  NEXIUM 40 MG capsule Take 1 capsule by mouth daily as needed. For acid reflux 03/12/14  Yes Historical Provider, MD  nitroGLYCERIN (NITROSTAT) 0.4 MG SL tablet Place 1 tablet (0.4 mg total) under the tongue every 5 (five) minutes as needed for chest pain. 06/02/15  Yes Carlena Bjornstad, MD  Omega-3 Fatty Acids (FISH OIL) 1000 MG CAPS Take by mouth daily.     Yes Historical Provider, MD  predniSONE (DELTASONE) 5 MG tablet Take 10 mg by mouth daily with breakfast.    Yes Historical Provider, MD  ramipril (ALTACE) 2.5 MG capsule Take 1 capsule (2.5 mg total) by mouth daily. 11/13/15  Yes Dorothy Spark, MD  sennosides-docusate sodium (SENOKOT-S) 8.6-50 MG tablet Take 2 tablets by mouth daily. 11/16/13  Yes Marchia Bond, MD  Tofacitinib Citrate (XELJANZ) 5 MG TABS Take 2 tablets by mouth.   Yes Historical Provider, MD  traMADol (ULTRAM) 50 MG tablet Take 1 tablet (50 mg total) by mouth 4 (four) times daily. 06/27/14  Yes Charlett Blake, MD  zolpidem (AMBIEN) 5 MG tablet Take 5 mg by mouth at bedtime. 12/13/14  Yes Historical Provider, MD  methocarbamol (ROBAXIN) 500 MG tablet Take 1 tablet (500 mg total) by mouth 4 (four) times daily. Patient not taking: Reported on 03/11/2016 11/16/13   Marchia Bond, MD    Family History Family History  Problem Relation Age of Onset  . Heart attack Mother   . Heart disease Mother   . Diabetes Brother   . Heart disease Brother   . Diabetes Sister   . Arthritis/Rheumatoid Father   . Other Father     hepatic cirrhosis  . Heart disease Maternal Aunt   . Cancer Maternal Aunt  throat  . Heart attack Maternal Aunt   . Heart attack Maternal Uncle   . Heart disease Brother   . Cancer      family history  . Heart attack      family history  . Cancer Maternal Grandmother     colon  . Cancer Maternal Grandfather     throat    Social History Social History  Substance Use Topics  . Smoking status: Never Smoker  . Smokeless tobacco: Never Used  . Alcohol use No     Allergies   Codeine phosphate; Amoxicillin; Penicillins; and Lidocaine   Review of Systems Review of Systems  All other systems reviewed and are negative.   Physical Exam Updated Vital Signs BP 161/76   Pulse 86   Temp 98.6 F (37 C) (Oral)   Resp 16   Ht 5\' 2"  (1.575 m)   Wt 99.8 kg   SpO2 100%   BMI 40.24 kg/m   Physical Exam  Constitutional: She is oriented to person, place, and time. She appears well-developed and well-nourished.  HENT:  Head: Normocephalic and atraumatic.  Eyes: Conjunctivae are normal. Pupils are equal, round, and reactive to light. Right eye exhibits no discharge. Left eye exhibits no discharge. No scleral icterus.  Neck: Normal range of motion. No JVD present. No tracheal deviation present.  Cardiovascular: Normal rate, regular rhythm, normal heart sounds and intact distal pulses.  Exam reveals no gallop and no friction rub.   No murmur heard. Pulmonary/Chest:  Effort normal. No stridor. No respiratory distress. She has no wheezes. She has no rales. She exhibits no tenderness.  Minor tenderness to palpation of left anterior chest wall  Neurological: She is alert and oriented to person, place, and time. Coordination normal.  Psychiatric: She has a normal mood and affect. Her behavior is normal. Judgment and thought content normal.  Nursing note and vitals reviewed.    ED Treatments / Results  Labs (all labs ordered are listed, but only abnormal results are displayed) Labs Reviewed  COMPREHENSIVE METABOLIC PANEL - Abnormal; Notable for the following:       Result Value   Potassium 3.3 (*)    Glucose, Bld 118 (*)    All other components within normal limits  CBC WITH DIFFERENTIAL/PLATELET  Randolm Idol, ED    EKG  EKG Interpretation  Date/Time:  Thursday March 11 2016 15:07:40 EDT Ventricular Rate:  82 PR Interval:  144 QRS Duration: 76 QT Interval:  384 QTC Calculation: 448 R Axis:   60 Text Interpretation:  Normal sinus rhythm ST & T wave abnormality, consider inferior ischemia ST & T wave abnormality, consider anterolateral ischemia Abnormal ECG No significant change since last tracing Confirmed by KNOTT MD, DANIEL [phone]) on 03/11/2016 4:59:05 PM       Radiology Dg Chest 2 View  Result Date: 03/11/2016 CLINICAL DATA:  One-week history of left-sided chest pain and shortness of breath. Left upper extremity tingling which began today. Current history of hypertension. EXAM: CHEST  2 VIEW COMPARISON:  02/27/2007. FINDINGS: Cardiac silhouette normal in size, unchanged. LAD coronary stent. Thoracic aorta mildly tortuous and atherosclerotic. Hilar and mediastinal contours otherwise unremarkable. Scarring involving the right middle lobe, unchanged. Lungs otherwise clear. No pleural effusions. Visualized bony thorax intact. Laparoscopic band which is appropriately oriented in the 2:00 to 8:00 position. IMPRESSION: No acute cardiopulmonary  disease. Electronically Signed   By: Evangeline Dakin M.D.   On: 03/11/2016 15:43    Procedures Procedures (including critical care  time)  Medications Ordered in ED Medications  aspirin EC tablet 325 mg (325 mg Oral Given 03/11/16 1741)  potassium chloride SA (K-DUR,KLOR-CON) CR tablet 40 mEq (40 mEq Oral Given 03/11/16 1856)     Initial Impression / Assessment and Plan / ED Course  I have reviewed the triage vital signs and the nursing notes.  Pertinent labs & imaging results that were available during my care of the patient were reviewed by me and considered in my medical decision making (see chart for details).  Clinical Course     Final Clinical Impressions(s) / ED Diagnoses   Final diagnoses:  Chest pain, unspecified chest pain type   Labs:  Imaging:  Consults:  Therapeutics:  Discharge Meds:   Assessment/Plan: 65 year old female presents today with chest pain. Patient has significant cardiac history with concerning complaints. Patient's EKG unchanged from previous, normal troponin, no signs of pulmonary etiology. Cardiology consult at, I spoke with Dr. Martinique who evaluated the patient in the ED.  Dr. Martinique evaluated the patient in the ED, he will admit for cardiac cath tomorrow. Patient has remained stable while here in the ED.      New Prescriptions New Prescriptions   No medications on file     Okey Regal, PA-C 03/11/16 1950    Leo Grosser, MD 03/12/16 [phone]

## 2016-03-11 NOTE — Telephone Encounter (Signed)
Received a call from patient.She stated she has been having intermittent chest pain for the past 1 week or more.Also pain in left arm.Rates pain # 3 to 4 at present.Advised she needs to go to ER.Trish called.

## 2016-03-11 NOTE — ED Triage Notes (Signed)
Pt st's she has had some left chest pain with shortness of breath x's 1 week.  St's pain is starting to increase

## 2016-03-12 ENCOUNTER — Other Ambulatory Visit: Payer: Self-pay

## 2016-03-12 ENCOUNTER — Encounter (HOSPITAL_COMMUNITY)
Admission: EM | Disposition: A | Discharge status: Home / Self Care | Payer: Self-pay | Source: Home / Self Care | Attending: Emergency Medicine

## 2016-03-12 ENCOUNTER — Encounter (HOSPITAL_COMMUNITY): Payer: Self-pay | Admitting: Internal Medicine

## 2016-03-12 ENCOUNTER — Ambulatory Visit (HOSPITAL_COMMUNITY)
Admission: EM | Disposition: A | Discharge status: Home / Self Care | Payer: Self-pay | Source: Home / Self Care | Attending: Emergency Medicine

## 2016-03-12 DIAGNOSIS — Z7982 Long term (current) use of aspirin: Secondary | ICD-10-CM | POA: Diagnosis not present

## 2016-03-12 DIAGNOSIS — I5032 Chronic diastolic (congestive) heart failure: Secondary | ICD-10-CM | POA: Diagnosis not present

## 2016-03-12 DIAGNOSIS — Z7902 Long term (current) use of antithrombotics/antiplatelets: Secondary | ICD-10-CM | POA: Diagnosis not present

## 2016-03-12 DIAGNOSIS — T82858A Stenosis of vascular prosthetic devices, implants and grafts, initial encounter: Secondary | ICD-10-CM | POA: Diagnosis not present

## 2016-03-12 DIAGNOSIS — Z6841 Body Mass Index (BMI) 40.0 and over, adult: Secondary | ICD-10-CM | POA: Diagnosis not present

## 2016-03-12 DIAGNOSIS — Z79899 Other long term (current) drug therapy: Secondary | ICD-10-CM | POA: Diagnosis not present

## 2016-03-12 DIAGNOSIS — I25110 Atherosclerotic heart disease of native coronary artery with unstable angina pectoris: Secondary | ICD-10-CM | POA: Diagnosis not present

## 2016-03-12 DIAGNOSIS — I2510 Atherosclerotic heart disease of native coronary artery without angina pectoris: Secondary | ICD-10-CM | POA: Diagnosis not present

## 2016-03-12 DIAGNOSIS — M069 Rheumatoid arthritis, unspecified: Secondary | ICD-10-CM | POA: Diagnosis not present

## 2016-03-12 DIAGNOSIS — I110 Hypertensive heart disease with heart failure: Secondary | ICD-10-CM | POA: Diagnosis not present

## 2016-03-12 DIAGNOSIS — E785 Hyperlipidemia, unspecified: Secondary | ICD-10-CM | POA: Diagnosis not present

## 2016-03-12 DIAGNOSIS — Z7952 Long term (current) use of systemic steroids: Secondary | ICD-10-CM | POA: Diagnosis not present

## 2016-03-12 HISTORY — PX: CARDIAC CATHETERIZATION: SHX172

## 2016-03-12 LAB — CBC
HCT: 38.8 % (ref 36.0–46.0)
HCT: 40.6 % (ref 36.0–46.0)
HEMOGLOBIN: 12.1 g/dL (ref 12.0–15.0)
HEMOGLOBIN: 12.4 g/dL (ref 12.0–15.0)
MCH: 28.8 pg (ref 26.0–34.0)
MCH: 28.4 pg (ref 26.0–34.0)
MCHC: 31.2 g/dL (ref 30.0–36.0)
MCHC: 30.5 g/dL (ref 30.0–36.0)
MCV: 92.4 fL (ref 78.0–100.0)
MCV: 93.1 fL (ref 78.0–100.0)
Platelets: 259 10*3/uL (ref 150–400)
Platelets: 244 10*3/uL (ref 150–400)
RBC: 4.2 MIL/uL (ref 3.87–5.11)
RBC: 4.36 MIL/uL (ref 3.87–5.11)
RDW: 14.7 % (ref 11.5–15.5)
RDW: 14.7 % (ref 11.5–15.5)
WBC: 8.4 10*3/uL (ref 4.0–10.5)
WBC: 8.5 10*3/uL (ref 4.0–10.5)

## 2016-03-12 LAB — COMPREHENSIVE METABOLIC PANEL
ALBUMIN: 3.5 g/dL (ref 3.5–5.0)
ALK PHOS: 66 U/L (ref 38–126)
ALT: 16 U/L (ref 14–54)
AST: 13 U/L — AB (ref 15–41)
Anion gap: 6 (ref 5–15)
BILIRUBIN TOTAL: 0.5 mg/dL (ref 0.3–1.2)
BUN: 9 mg/dL (ref 6–20)
CALCIUM: 9.2 mg/dL (ref 8.9–10.3)
CO2: 25 mmol/L (ref 22–32)
CREATININE: 0.63 mg/dL (ref 0.44–1.00)
Chloride: 108 mmol/L (ref 101–111)
GFR calc Af Amer: >60 mL/min (ref >60)
GLUCOSE: 97 mg/dL (ref 65–99)
POTASSIUM: 3.2 mmol/L — AB (ref 3.5–5.1)
Sodium: 139 mmol/L (ref 135–145)
TOTAL PROTEIN: 6 g/dL — AB (ref 6.5–8.1)

## 2016-03-12 LAB — LIPID PANEL
Cholesterol: 117 mg/dL (ref 0–200)
HDL: 52 mg/dL (ref >40)
LDL CALC: 35 mg/dL (ref 0–99)
TRIGLYCERIDES: 150 mg/dL — AB (ref <150)
Total CHOL/HDL Ratio: 2.3 RATIO
VLDL: 30 mg/dL (ref 0–40)

## 2016-03-12 LAB — TROPONIN I: Troponin I: <0.03 ng/mL (ref <0.03)

## 2016-03-12 LAB — PROTIME-INR
INR: 1.09
PROTHROMBIN TIME: 14.1 s (ref 11.4–15.2)

## 2016-03-12 LAB — CREATININE, SERUM
CREATININE: 0.64 mg/dL (ref 0.44–1.00)
GFR calc Af Amer: >60 mL/min (ref >60)
GFR calc non Af Amer: >60 mL/min (ref >60)

## 2016-03-12 LAB — HEPARIN LEVEL (UNFRACTIONATED): HEPARIN UNFRACTIONATED: 0.27 [IU]/mL — AB (ref 0.30–0.70)

## 2016-03-12 LAB — POCT ACTIVATED CLOTTING TIME
ACTIVATED CLOTTING TIME: 208 s
Activated Clotting Time: 252 seconds

## 2016-03-12 SURGERY — LEFT HEART CATH AND CORONARY ANGIOGRAPHY
Anesthesia: LOCAL

## 2016-03-12 MED ORDER — FENTANYL CITRATE (PF) 100 MCG/2ML IJ SOLN
INTRAMUSCULAR | Status: DC | PRN
  Administered 2016-03-12: 50 ug via INTRAVENOUS

## 2016-03-12 MED ORDER — SODIUM CHLORIDE 0.9% FLUSH: 3.0000 mL | INTRAVENOUS | Status: DC | PRN

## 2016-03-12 MED ORDER — ENOXAPARIN SODIUM 40 MG/0.4ML ~~LOC~~ SOLN: 40.0000 mg | SUBCUTANEOUS | Status: DC

## 2016-03-12 MED ORDER — SODIUM CHLORIDE 0.9 % IV SOLN: 250.0000 mL | INTRAVENOUS | Status: DC | PRN

## 2016-03-12 MED ORDER — VERAPAMIL HCL 2.5 MG/ML IV SOLN
INTRAVENOUS | Status: AC
  Filled 2016-03-12: qty 2

## 2016-03-12 MED ORDER — ADENOSINE (DIAGNOSTIC) 140MCG/KG/MIN
INTRAVENOUS | Status: DC | PRN
  Administered 2016-03-12: 140 ug/kg/min via INTRAVENOUS

## 2016-03-12 MED ORDER — IOPAMIDOL (ISOVUE-370) INJECTION 76%
INTRAVENOUS | Status: DC | PRN
  Administered 2016-03-12: 90 mL via INTRAVENOUS

## 2016-03-12 MED ORDER — HEPARIN (PORCINE) IN NACL 2-0.9 UNIT/ML-% IJ SOLN
INTRAMUSCULAR | Status: DC | PRN
  Administered 2016-03-12: 1000 mL

## 2016-03-12 MED ORDER — ADENOSINE 12 MG/4ML IV SOLN
INTRAVENOUS | Status: AC
  Filled 2016-03-12: qty 4

## 2016-03-12 MED ORDER — CHLOROPROCAINE HCL (PF) 3 % IJ SOLN
INTRAMUSCULAR | Status: DC | PRN
Start: 2016-03-12 — End: 2016-03-12
  Administered 2016-03-12: 2 mL

## 2016-03-12 MED ORDER — FENTANYL CITRATE (PF) 100 MCG/2ML IJ SOLN
INTRAMUSCULAR | Status: AC
  Filled 2016-03-12: qty 2

## 2016-03-12 MED ORDER — CHLOROPROCAINE HCL (PF) 3 % IJ SOLN
20.0000 mL | Freq: Once | INTRAMUSCULAR | Status: DC
  Filled 2016-03-12: qty 20

## 2016-03-12 MED ORDER — VERAPAMIL HCL 2.5 MG/ML IV SOLN
INTRAVENOUS | Status: DC | PRN
  Administered 2016-03-12: 10 mL via INTRA_ARTERIAL

## 2016-03-12 MED ORDER — SODIUM CHLORIDE 0.9 % IV SOLN: INTRAVENOUS | Status: AC

## 2016-03-12 MED ORDER — HEPARIN SODIUM (PORCINE) 1000 UNIT/ML IJ SOLN
INTRAMUSCULAR | Status: AC
  Filled 2016-03-12: qty 1

## 2016-03-12 MED ORDER — MIDAZOLAM HCL 2 MG/2ML IJ SOLN
INTRAMUSCULAR | Status: AC
  Filled 2016-03-12: qty 2

## 2016-03-12 MED ORDER — IOPAMIDOL (ISOVUE-370) INJECTION 76%
INTRAVENOUS | Status: AC
  Filled 2016-03-12: qty 100

## 2016-03-12 MED ORDER — POTASSIUM CHLORIDE CRYS ER 20 MEQ PO TBCR
40.0000 meq | EXTENDED_RELEASE_TABLET | Freq: Two times a day (BID) | ORAL | Status: DC
  Administered 2016-03-12: 40 meq via ORAL
  Filled 2016-03-12: qty 2

## 2016-03-12 MED ORDER — SODIUM CHLORIDE 0.9% FLUSH: 3.0000 mL | Freq: Two times a day (BID) | INTRAVENOUS | Status: DC

## 2016-03-12 MED ORDER — HEPARIN (PORCINE) IN NACL 2-0.9 UNIT/ML-% IJ SOLN
INTRAMUSCULAR | Status: AC
  Filled 2016-03-12: qty 1000

## 2016-03-12 MED ORDER — LIDOCAINE HCL (PF) 1 % IJ SOLN
INTRAMUSCULAR | Status: AC
  Filled 2016-03-12: qty 30

## 2016-03-12 MED ORDER — FUROSEMIDE 40 MG PO TABS: 40.0000 mg | ORAL_TABLET | Freq: Every day | ORAL | Status: DC

## 2016-03-12 MED ORDER — HEPARIN SODIUM (PORCINE) 1000 UNIT/ML IJ SOLN
INTRAMUSCULAR | Status: DC | PRN
  Administered 2016-03-12: 5000 [IU] via INTRAVENOUS
  Administered 2016-03-12 (×2): 2000 [IU] via INTRAVENOUS

## 2016-03-12 MED ORDER — MIDAZOLAM HCL 2 MG/2ML IJ SOLN
INTRAMUSCULAR | Status: DC | PRN
  Administered 2016-03-12: 1 mg via INTRAVENOUS

## 2016-03-12 SURGICAL SUPPLY — 15 items
CATH INFINITI 5FR ANG PIGTAIL (CATHETERS) ×1 IMPLANT
CATH MICROCATH NAVVUS (MICROCATHETER) IMPLANT
CATH OPTITORQUE TIG 4.0 5F (CATHETERS) ×1 IMPLANT
CATH VISTA GUIDE 6FR XB3 (CATHETERS) ×1 IMPLANT
DEVICE RAD COMP TR BAND LRG (VASCULAR PRODUCTS) ×1 IMPLANT
GLIDESHEATH SLEND SS 6F .021 (SHEATH) ×1 IMPLANT
KIT ESSENTIALS PG (KITS) ×1 IMPLANT
KIT HEART LEFT (KITS) ×2 IMPLANT
MICROCATHETER NAVVUS (MICROCATHETER) ×2
PACK CARDIAC CATHETERIZATION (CUSTOM PROCEDURE TRAY) ×2 IMPLANT
SYR MEDRAD MARK V 150ML (SYRINGE) ×2 IMPLANT
TRANSDUCER W/STOPCOCK (MISCELLANEOUS) ×2 IMPLANT
TUBING CIL FLEX 10 FLL-RA (TUBING) ×2 IMPLANT
WIRE HI TORQ BMW 190CM (WIRE) ×1 IMPLANT
WIRE SAFE-T 1.5MM-J .035X260CM (WIRE) ×1 IMPLANT

## 2016-03-12 NOTE — Progress Notes (Signed)
Cindy Holmes to be D/C'd Home per MD order. Discussed with the patient and all questions fully answered.    Medication List    TAKE these medications   allopurinol 300 MG tablet Commonly known as:  ZYLOPRIM Take 300 mg by mouth daily.   aspirin 81 MG tablet Take 81 mg by mouth daily.   atorvastatin 40 MG tablet Commonly known as:  LIPITOR Take 2 tablets (80 mg total) by mouth daily.   clopidogrel 75 MG tablet Commonly known as:  PLAVIX Take 1 tablet (75 mg total) by mouth daily.   desloratadine 5 MG tablet Commonly known as:  CLARINEX Take 5 mg by mouth daily.   diazepam 5 MG tablet Commonly known as:  VALIUM Take 1 tablet by mouth daily.   ezetimibe 10 MG tablet Commonly known as:  ZETIA Take 1 tablet (10 mg total) by mouth daily.   Fish Oil 1000 MG Caps Take by mouth daily.   folic acid 1 MG tablet Commonly known as:  FOLVITE Take 1 mg by mouth daily. Takes with chemo medication.   furosemide 40 MG tablet Commonly known as:  LASIX Take 1 tablet (40 mg total) by mouth daily.   gabapentin 300 MG capsule Commonly known as:  NEURONTIN Take 1 capsule (300 mg total) by mouth 3 (three) times daily.   leflunomide 20 MG tablet Commonly known as:  ARAVA Take 20 mg by mouth daily.   methocarbamol 500 MG tablet Commonly known as:  ROBAXIN Take 1 tablet (500 mg total) by mouth 4 (four) times daily.   metoprolol 50 MG tablet Commonly known as:  LOPRESSOR take 1 tablet by mouth once daily   NEXIUM 40 MG capsule Generic drug:  esomeprazole Take 1 capsule by mouth daily as needed. For acid reflux   nitroGLYCERIN 0.4 MG SL tablet Commonly known as:  NITROSTAT Place 1 tablet (0.4 mg total) under the tongue every 5 (five) minutes as needed for chest pain.   predniSONE 5 MG tablet Commonly known as:  DELTASONE Take 10 mg by mouth daily with breakfast.   ramipril 2.5 MG capsule Commonly known as:  ALTACE Take 1 capsule (2.5 mg total) by mouth daily.    sennosides-docusate sodium 8.6-50 MG tablet Commonly known as:  SENOKOT-S Take 2 tablets by mouth daily.   traMADol 50 MG tablet Commonly known as:  ULTRAM Take 1 tablet (50 mg total) by mouth 4 (four) times daily.   XELJANZ 5 MG Tabs Generic drug:  Tofacitinib Citrate Take 2 tablets by mouth.   zolpidem 5 MG tablet Commonly known as:  AMBIEN Take 5 mg by mouth at bedtime.       VVS, Skin clean, dry and intact without evidence of skin break down, no evidence of skin tears noted.  IV catheter discontinued intact. Site without signs and symptoms of complications. Dressing and pressure applied.  An After Visit Summary was printed and given to the patient.  Patient escorted via [place], and D/C home via private auto.  Cindy Holmes  03/12/2016 3:52 PM

## 2016-03-12 NOTE — Discharge Summary (Signed)
Discharge Summary    Patient ID: Cindy Holmes,  MRN: [id number], DOB/AGE: 01-29-1951 65 y.o.  Admit date: 03/11/2016 Discharge date: 03/12/2016  Primary Care Provider: Leonides Grills Primary Cardiologist: Dr. Meda Coffee  Discharge Diagnoses    Principal Problem:   Chest pain Active Problems:   CAD (coronary artery disease)   HTN (hypertension)   Hyperlipidemia   History of Present Illness     Cindy Holmes is a 65 y.o. female with past medical history of CAD (s/p PTCA of LAD and stent placement to mid-LAD in 2001, PTCA Dx in 2002), HTN, HLD, morbid obesity, chronic diastolic CHF and RA who presented to [facility] on 03/11/2016 for evaluation of chest pain.   She reported that over the past week she had experienced some left sided chest pain and shortness of breath. Says it felt like a "ache" and radiated into her left shoulder. She denied any associated nausea or vomiting or exertional component. She reported tenderness to palpation of the chest wall, even when her cat lays on her.   She has been seen for atypical chest pain in the past and had a low-risk nuclear stress test the previous year. Initial troponin was negative and EKG was without acute ischemic changes.   With her repeat episodes of chest discomfort, a definitive cardiac catheterization was recommended and this was planned for the following day.   Hospital Course     Consultants: None   The following morning, she denied any episodes of chest discomfort. Cyclic troponin values remained negative. LDL was at 35. Her cardiac catheterization showed moderate 40% in-stent restenosis of proximal LAD stent, which was not hemodynamically significant with an FFR of 0.84. Mild disease of 10-30% stenosis involving ostial D1 and mid/distal LAD stent was noted. LV function was normal and overall continued medical management with aggressive risk factor modification was recommended. She tolerated the procedure well and  no complications were noted.   She was last examined by Dr. Martinique and deemed stable for discharge. She will continue on ASA, statin, Plavix, Zetia, BB, Lisinopril, and Lasix. Cardiology follow-up has been arranged with Dr. Meda Coffee. She was discharged home in stable condition.   _____________  Discharge Vitals and Physical Exam:  Blood pressure (!) 129/44, pulse 62, temperature 98 F (36.7 C), temperature source Oral, resp. rate 18, height 5\' 2"  (1.575 m), weight 220 lb 8 oz (100 kg), SpO2 98 %.  Filed Weights   03/11/16 1514 03/11/16 2129  Weight: 220 lb (99.8 kg) 220 lb 8 oz (100 kg)   General: Well developed, well nourished, female appearing in no acute distress. Head: Normocephalic, atraumatic.  Neck: Supple without bruits, JVD not elevated. Lungs:  Resp regular and unlabored, CTA without wheezing or rales. Heart: RRR, S1, S2, no S3, S4, or murmur; no rub. Abdomen: Soft, non-tender, non-distended with normoactive bowel sounds. No hepatomegaly. No rebound/guarding. No obvious abdominal masses. Extremities: No clubbing, cyanosis, or edema. Distal pedal pulses are 2+ bilaterally. Right radial cath site stable without ecchymosis or evidence of a hematoma.  Neuro: Alert and oriented X 3. Moves all extremities spontaneously. Psych: Normal affect.  Labs & Radiologic Studies     CBC  Recent Labs  03/11/16 1513 03/12/16 0251 03/12/16 1007  WBC 9.9 8.4 8.5  NEUTROABS 7.0  --   --   HGB 13.9 12.1 12.4  HCT 43.3 38.8 40.6  MCV 91.7 92.4 93.1  PLT 299 259 [id number]   Basic Metabolic Panel  Recent  Labs  03/11/16 1513 03/12/16 0251 03/12/16 1007  NA 138 139  --   K 3.3* 3.2*  --   CL 102 108  --   CO2 26 25  --   GLUCOSE 118* 97  --   BUN 11 9  --   CREATININE 0.71 0.63 0.64  CALCIUM 9.4 9.2  --    Liver Function Tests  Recent Labs  03/11/16 1513 03/12/16 0251  AST 15 13*  ALT 19 16  ALKPHOS 82 66  BILITOT 0.3 0.5  PROT 7.4 6.0*  ALBUMIN 4.3 3.5   No results for  input(s): LIPASE, AMYLASE in the last 72 hours. Cardiac Enzymes  Recent Labs  03/11/16 2149 03/12/16 0251 03/12/16 1007  TROPONINI <0.03 <0.03 <0.03   BNP Invalid input(s): POCBNP D-Dimer No results for input(s): DDIMER in the last 72 hours. Hemoglobin A1C No results for input(s): HGBA1C in the last 72 hours. Fasting Lipid Panel  Recent Labs  03/12/16 0252  CHOL 117  HDL 52  LDLCALC 35  TRIG 150*  CHOLHDL 2.3   Thyroid Function Tests  Recent Labs  03/11/16 2150  TSH 2.398    Dg Chest 2 View  Result Date: 03/11/2016 CLINICAL DATA:  One-week history of left-sided chest pain and shortness of breath. Left upper extremity tingling which began today. Current history of hypertension. EXAM: CHEST  2 VIEW COMPARISON:  02/27/2007. FINDINGS: Cardiac silhouette normal in size, unchanged. LAD coronary stent. Thoracic aorta mildly tortuous and atherosclerotic. Hilar and mediastinal contours otherwise unremarkable. Scarring involving the right middle lobe, unchanged. Lungs otherwise clear. No pleural effusions. Visualized bony thorax intact. Laparoscopic band which is appropriately oriented in the 2:00 to 8:00 position. IMPRESSION: No acute cardiopulmonary disease. Electronically Signed   By: Evangeline Dakin M.D.   On: 03/11/2016 15:43   Diagnostic Studies/Procedures     Cardiac Catheterization: 03/12/2016 1.  Moderate in-stent restenosis of proximal LAD stent, which is not hemodynamically significant (FFR 0.84). 2.  Mild disease involving ostial D1 and mid/distal LAD stent. 3.  Normal LV contraction with upper normal LV filling pressure.  Plan: 1. Continue medical management and aggressive risk factor modification.    Disposition   Pt is being discharged home today in good condition.  Follow-up Plans & Appointments    Follow-up Information    Ena Dawley, MD Follow up on 04/20/2016.   Specialty:  Cardiology Why:  Cardiology Follow-up on 04/20/2016 at  9:45AM. Contact information: [address] [place] [address] [phone]          Discharge Instructions    Diet - low sodium heart healthy    Complete by:  As directed   Discharge instructions    Complete by:  As directed   PLEASE REMEMBER TO BRING ALL OF YOUR MEDICATIONS TO [place].  PLEASE ATTEND ALL SCHEDULED FOLLOW-UP APPOINTMENTS.   Activity: Increase activity slowly as tolerated. You may shower, but no soaking baths (or swimming) for 1 week. No driving for 24 hours. No lifting over 5 lbs for 1 week. No sexual activity for 1 week.   You May Return to Work: in 1 week (if applicable)  Wound Care: You may wash cath site gently with soap and water. Keep cath site clean and dry. If you notice pain, swelling, bleeding or pus at your cath site, please call [phone].   Increase activity slowly    Complete by:  As directed      Discharge Medications  Medication List    TAKE these medications   allopurinol 300 MG tablet Commonly known as:  ZYLOPRIM Take 300 mg by mouth daily.   aspirin 81 MG tablet Take 81 mg by mouth daily.   atorvastatin 40 MG tablet Commonly known as:  LIPITOR Take 2 tablets (80 mg total) by mouth daily.   clopidogrel 75 MG tablet Commonly known as:  PLAVIX Take 1 tablet (75 mg total) by mouth daily.   desloratadine 5 MG tablet Commonly known as:  CLARINEX Take 5 mg by mouth daily.   diazepam 5 MG tablet Commonly known as:  VALIUM Take 1 tablet by mouth daily.   ezetimibe 10 MG tablet Commonly known as:  ZETIA Take 1 tablet (10 mg total) by mouth daily.   Fish Oil 1000 MG Caps Take by mouth daily.   folic acid 1 MG tablet Commonly known as:  FOLVITE Take 1 mg by mouth daily. Takes with chemo medication.   furosemide 40 MG tablet Commonly known as:  LASIX Take 1 tablet (40 mg total) by mouth daily.   gabapentin 300 MG capsule Commonly known as:  NEURONTIN Take 1 capsule (300  mg total) by mouth 3 (three) times daily.   leflunomide 20 MG tablet Commonly known as:  ARAVA Take 20 mg by mouth daily.   methocarbamol 500 MG tablet Commonly known as:  ROBAXIN Take 1 tablet (500 mg total) by mouth 4 (four) times daily.   metoprolol 50 MG tablet Commonly known as:  LOPRESSOR take 1 tablet by mouth once daily   NEXIUM 40 MG capsule Generic drug:  esomeprazole Take 1 capsule by mouth daily as needed. For acid reflux   nitroGLYCERIN 0.4 MG SL tablet Commonly known as:  NITROSTAT Place 1 tablet (0.4 mg total) under the tongue every 5 (five) minutes as needed for chest pain.   predniSONE 5 MG tablet Commonly known as:  DELTASONE Take 10 mg by mouth daily with breakfast.   ramipril 2.5 MG capsule Commonly known as:  ALTACE Take 1 capsule (2.5 mg total) by mouth daily.   sennosides-docusate sodium 8.6-50 MG tablet Commonly known as:  SENOKOT-S Take 2 tablets by mouth daily.   traMADol 50 MG tablet Commonly known as:  ULTRAM Take 1 tablet (50 mg total) by mouth 4 (four) times daily.   XELJANZ 5 MG Tabs Generic drug:  Tofacitinib Citrate Take 2 tablets by mouth.   zolpidem 5 MG tablet Commonly known as:  AMBIEN Take 5 mg by mouth at bedtime.         Allergies Allergies  Allergen Reactions  . Codeine Phosphate Shortness Of Breath    REACTION: unspecified  . Amoxicillin Nausea And Vomiting    REACTION: unspecified  . Penicillins Nausea And Vomiting    Has patient had a PCN reaction causing immediate rash, facial/tongue/throat swelling, SOB or lightheadedness with hypotension:YES Has patient had a PCN reaction causing severe rash involving mucus membranes or skin necrosis: NO Has patient had a PCN reaction that required hospitalization NO Has patient had a PCN reaction occurring within the last 10 years: NO If all of the above answers are "NO", then may proceed with Cephalosporin use.  . Lidocaine Rash    Patch caused rash     Outstanding  Labs/Studies   BMET at time of follow-up appointment.   Duration of Discharge Encounter   Greater than 30 minutes including physician time.  Signed, Erma Heritage, PA-C 03/12/2016, 9:25 PM

## 2016-03-12 NOTE — Progress Notes (Signed)
ANTICOAGULATION CONSULT NOTE - Follow Up Consult  Pharmacy Consult for Heparin  Indication: chest pain/ACS   Patient Measurements: Height: 5\' 2"  (157.5 cm) Weight: 220 lb 8 oz (100 kg) (b scale) IBW/kg (Calculated) : 50.1  Vital Signs: Temp: 98.4 F (36.9 C) (08/31 2129) Temp Source: Oral (08/31 2129) BP: 103/51 (09/01 0258) Pulse Rate: 64 (09/01 0258)  Labs:  Recent Labs  03/11/16 1513 03/11/16 2149 03/12/16 0251 03/12/16 0252  HGB 13.9  --  12.1  --   HCT 43.3  --  38.8  --   PLT 299  --  259  --   LABPROT  --  13.4 14.1  --   INR  --  1.02 1.09  --   HEPARINUNFRC  --   --   --  0.27*  CREATININE 0.71  --   --   --   TROPONINI  --  <0.03  --   --     Estimated Creatinine Clearance: 78.6 mL/min (by C-G formula based on SCr of 0.8 mg/dL).   Assessment: Heparin for CP, hx of stents, plan for cath today, initial heparin level slightly low, no issues per RN.   Goal of Therapy:  Heparin level 0.3-0.7 units/ml Monitor platelets by anticoagulation protocol: Yes   Plan:  -Inc heparin to 1000 units/hr -1200 HL  Cindy Holmes 03/12/2016,3:35 AM

## 2016-03-12 NOTE — Care Management Note (Signed)
Case Management Note  Patient Details  Name: KAYCIE UMBAUGH MRN: [id number] Date of Birth: June 10, 1951  Subjective/Objective:                    Action/Plan: Pt discharging home with self care. No further needs per CM.   Expected Discharge Date:                  Expected Discharge Plan:  Home/Self Care  In-House Referral:     Discharge planning Services     Post Acute Care Choice:    Choice offered to:     DME Arranged:    DME Agency:     HH Arranged:    [place] Agency:     Status of Service:  Completed, signed off  If discussed at [employer] of Stay Meetings, dates discussed:    Additional Comments:  Pollie Friar, RN 03/12/2016, 3:28 PM

## 2016-03-12 NOTE — Interval H&P Note (Signed)
History and Physical Interval Note:  03/12/2016 8:03 AM  Cindy Holmes  has presented today for cardiac catheterization, with the diagnosis of unstable angina  The various methods of treatment have been discussed with the patient and family. After consideration of risks, benefits and other options for treatment, the patient has consented to  Procedure(s): Left Heart Cath and Coronary Angiography (N/A) as a surgical intervention .  The patient's history has been reviewed, patient examined, no change in status, stable for surgery.  I have reviewed the patient's chart and labs.  Questions were answered to the patient's satisfaction.    Cath Lab Visit (complete for each Cath Lab visit)  Clinical Evaluation Leading to the Procedure:   ACS: No.  Non-ACS:    Anginal Classification: CCS IV (progressive, constant chest pain over last few weeks)  Anti-ischemic medical therapy: Minimal Therapy (1 class of medications)  Non-Invasive Test Results: Low-risk stress test findings: cardiac mortality <1%/year (01/2015)  Prior CABG: No previous CABG   Cindy Holmes

## 2016-03-13 LAB — HEMOGLOBIN A1C
HEMOGLOBIN A1C: 5.5 % (ref 4.8–5.6)
Mean Plasma Glucose: 111 mg/dL

## 2016-03-16 MED FILL — Lidocaine HCl Local Preservative Free (PF) Inj 1%: INTRAMUSCULAR | Qty: 30 | Status: AC

## 2016-03-23 ENCOUNTER — Other Ambulatory Visit: Payer: Self-pay | Admitting: Cardiology

## 2016-03-25 MED FILL — METOPROLOL TARTRATE 50 MG T: 50 | 30 days supply | Qty: 30 | Fill #0

## 2016-03-30 ENCOUNTER — Other Ambulatory Visit: Payer: Self-pay | Admitting: Cardiology

## 2016-04-02 ENCOUNTER — Encounter: Payer: Self-pay | Admitting: Cardiology

## 2016-04-05 ENCOUNTER — Other Ambulatory Visit: Payer: Self-pay | Admitting: Cardiology

## 2016-04-06 MED FILL — ATORVASTATIN 40 MG TABLET: 40 | 30 days supply | Qty: 60 | Fill #2

## 2016-04-08 ENCOUNTER — Other Ambulatory Visit: Payer: Self-pay | Admitting: Cardiology

## 2016-04-12 MED FILL — EZETIMIBE 10 MG TABLET: 10 | 30 days supply | Qty: 30 | Fill #4

## 2016-04-20 ENCOUNTER — Ambulatory Visit: Payer: 59 | Admitting: Cardiology

## 2016-04-24 MED FILL — METOPROLOL TARTRATE 50 MG T: 50 | 30 days supply | Qty: 30 | Fill #1

## 2016-04-29 ENCOUNTER — Other Ambulatory Visit: Payer: Self-pay | Admitting: Cardiology

## 2016-04-29 NOTE — Telephone Encounter (Signed)
atorvastatin (LIPITOR) 40 MG tablet  Medication  Date: 11/13/2015 Department: [address] Office Ordering/Authorizing: Dorothy Spark, MD  Order Providers   Prescribing Provider Encounter Provider  Dorothy Spark, MD Dorothy Spark, MD  Medication Detail    Disp Refills Start End   atorvastatin (LIPITOR) 40 MG tablet 180 tablet 11 11/13/2015    Sig - Route: Take 2 tablets (80 mg total) by mouth daily. - Oral   E-Prescribing Status: Receipt confirmed by pharmacy (11/13/2015 10:56 AM EDT)   Pharmacy   [place], [place].

## 2016-04-29 NOTE — Telephone Encounter (Signed)
metoprolol (LOPRESSOR) 50 MG tablet  Medication  Date: 11/13/2015 Department: [address] Office Ordering/Authorizing: Dorothy Spark, MD  Order Providers   Prescribing Provider Encounter Provider  Dorothy Spark, MD Dorothy Spark, MD  Medication Detail    Disp Refills Start End   metoprolol (LOPRESSOR) 50 MG tablet 90 tablet 11 11/13/2015    Sig: take 1 tablet by mouth once daily   E-Prescribing Status: Receipt confirmed by pharmacy (11/13/2015 10:56 AM EDT)   Pharmacy   [place], [place].

## 2016-05-02 DIAGNOSIS — Z23 Encounter for immunization: Secondary | ICD-10-CM | POA: Diagnosis not present

## 2016-05-06 MED FILL — CLINDAMYCIN HCL 150 MG CAP: 150 | 7 days supply | Qty: 21 | Fill #0

## 2016-05-06 MED FILL — EZETIMIBE 10 MG TABLET: 10 | 30 days supply | Qty: 30 | Fill #5

## 2016-05-07 MED FILL — ATORVASTATIN 40 MG TABLET: 40 | 30 days supply | Qty: 60 | Fill #3

## 2016-05-20 MED FILL — CLINDAMYCIN HCL 150 MG CAP: 150 | 7 days supply | Qty: 21 | Fill #1

## 2016-05-25 MED FILL — METOPROLOL TARTRATE 50 MG T: 50 | 30 days supply | Qty: 30 | Fill #2 | Status: TO

## 2016-05-27 ENCOUNTER — Other Ambulatory Visit: Payer: Self-pay | Admitting: Cardiology

## 2016-06-09 MED FILL — ATORVASTATIN 40 MG TABLET: 40 | 30 days supply | Qty: 60 | Fill #4 | Status: TO

## 2016-06-16 ENCOUNTER — Other Ambulatory Visit: Payer: Self-pay | Admitting: Cardiology

## 2016-06-16 MED ORDER — FUROSEMIDE 40 MG PO TABS: 40.0000 mg | ORAL_TABLET | Freq: Every day | ORAL | 1 refills | Status: DC

## 2016-06-16 MED ORDER — RAMIPRIL 2.5 MG PO CAPS: 2.5000 mg | ORAL_CAPSULE | Freq: Every day | ORAL | 1 refills | Status: DC

## 2016-06-21 ENCOUNTER — Telehealth: Payer: Self-pay | Admitting: Cardiology

## 2016-06-21 MED ORDER — ATORVASTATIN CALCIUM 40 MG PO TABS: 80.0000 mg | ORAL_TABLET | Freq: Every day | ORAL | 3 refills | Status: DC

## 2016-06-21 MED ORDER — RAMIPRIL 2.5 MG PO CAPS: 2.5000 mg | ORAL_CAPSULE | Freq: Every day | ORAL | 3 refills | Status: DC

## 2016-06-21 NOTE — Telephone Encounter (Signed)
Cindy Holmes is calling concerning all her medications . Please call .Marland Kitchen

## 2016-06-21 NOTE — Telephone Encounter (Signed)
Pt is calling in to request a refill on 2 of her cardiac meds and schedule a follow-up appt with Dr Meda Coffee or an Extender on her team, as she was instructed too back in August, post-hospital follow-up.   Pt states that she went to the hospital back in August, and was discharged and instructed to follow-up with our office some days later.   Pt states that she had an appt, but had to cancel this, for her insurance changed, and she could not afford to come in at that time.  Pt states that she informed the scheduler of this, and told them she wouldn't be able to see someone until the beginning of the year, for her insurance will pick back up.   Pt states the scheduler never offered for her to reschedule at that time.  Pt states she can only see someone on a Thursday or Friday, due to work-related travel.  Offered the pt to see Ellen Henri PA-C on Thursday 07/15/16 at 1000.  Advised the pt to come fasting to that appt, for labs may be drawn.  Pt agrees with this, for she states she would like her lipids checked at that time . Advised the pt to arrive 15 mins prior to scheduled appt.  Pt verbalized understanding and agrees with this plan.   Pt requesting for a 90 day supply of her atorvastatin and ramipril to be sent to [facility], for she will run out of both of those meds soon.    Pt very gracious for all the assistance provided

## 2016-06-28 ENCOUNTER — Other Ambulatory Visit: Payer: Self-pay | Admitting: Cardiology

## 2016-06-29 NOTE — Telephone Encounter (Signed)
Medication Detail    Disp Refills Start End   metoprolol (LOPRESSOR) 50 MG tablet 90 tablet 11 11/13/2015    Sig: take 1 tablet by mouth once daily   E-Prescribing Status: Receipt confirmed by pharmacy (11/13/2015 10:56 AM EDT)   Pharmacy   [place], [place].

## 2016-07-08 DIAGNOSIS — M0579 Rheumatoid arthritis with rheumatoid factor of multiple sites without organ or systems involvement: Secondary | ICD-10-CM | POA: Diagnosis not present

## 2016-07-08 DIAGNOSIS — M109 Gout, unspecified: Secondary | ICD-10-CM | POA: Diagnosis not present

## 2016-07-08 DIAGNOSIS — M549 Dorsalgia, unspecified: Secondary | ICD-10-CM | POA: Diagnosis not present

## 2016-07-08 DIAGNOSIS — M797 Fibromyalgia: Secondary | ICD-10-CM | POA: Diagnosis not present

## 2016-07-08 DIAGNOSIS — N390 Urinary tract infection, site not specified: Secondary | ICD-10-CM | POA: Diagnosis not present

## 2016-07-15 ENCOUNTER — Encounter (INDEPENDENT_AMBULATORY_CARE_PROVIDER_SITE_OTHER): Payer: Self-pay

## 2016-07-15 ENCOUNTER — Ambulatory Visit (INDEPENDENT_AMBULATORY_CARE_PROVIDER_SITE_OTHER): Payer: Medicare Other | Admitting: Cardiology

## 2016-07-15 ENCOUNTER — Encounter: Payer: Self-pay | Admitting: Cardiology

## 2016-07-15 VITALS — BP 120/84 | HR 72 | Ht 62.0 in | Wt 225.0 lb

## 2016-07-15 DIAGNOSIS — I2510 Atherosclerotic heart disease of native coronary artery without angina pectoris: Secondary | ICD-10-CM | POA: Diagnosis not present

## 2016-07-15 MED ORDER — FUROSEMIDE 40 MG PO TABS: 40.0000 mg | ORAL_TABLET | Freq: Every day | ORAL | 3 refills | Status: DC

## 2016-07-15 NOTE — Patient Instructions (Signed)
Medication Instructions:  1. CONTINUE LASIX 40 MG DAILY; MAY TAKE EXTRA 40 MG LASIX FOR WEIGHT GAIN OF 3 LB'S IN 1 DAY OR 5 LB'S IN 1 WEEK  Labwork: TODAY CMET, LIPID  Testing/Procedures: NONE  Follow-Up: WE WILL SEND OUT A REMINDER LETTER FOR YOU TO CALL AND MAKE AN APPT TO SEE DR. Meda Coffee IN April OR MAY 2018  Any Other Special Instructions Will Be Listed Below (If Applicable).     If you need a refill on your cardiac medications before your next appointment, please call your pharmacy.

## 2016-07-15 NOTE — Progress Notes (Signed)
07/15/2016 [name]   [birth year]  [id number]  Primary Physician Leonides Grills, MD Primary Cardiologist: Dr. Meda Coffee    Reason for Visit/CC: [facility] f/u for CAD  HPI:  Cindy Shawn Wickeris a 66 y.o.femalewith past medical history of CAD (s/p PTCA of LAD and stent placement to mid-LAD in 2001, PTCA Dx in 2002), HTN, HLD, morbid obesity, chronic diastolic CHF and RA who presented to [facility] on 03/11/2016 for evaluation of chest pain. She has been seen for atypical chest pain in the past and had a low-risk nuclear stress test the previous year. Initial troponin was negative and EKG was without acute ischemic changes. With her repeat episodes of chest discomfort, a definitive cardiac catheterization was recommended. Cyclic troponin values remained negative. LDL was at 35. Her cardiac catheterization showed moderate 40% in-stent restenosis of proximal LAD stent, which was not hemodynamically significant with an FFR of 0.84. Mild disease of 10-30% stenosis involving ostial D1 and mid/distal LAD stent was noted. LV function was normal and overall continued medical management with aggressive risk factor modification was recommended. She tolerated the procedure well and no complications were noted. She was discharge home on medical therapy. She was instructed to continue ASA, Plavix, Lipitor, metoprolol, lisinopril, lasix and Zetia.  She presents to clinic for f/u. She was initially scheduled 1-2 weeks following her discharge but canceled the appointment and decided to reschedule the first of the year, after her insurance renewed. She reports that she has done well. No recurrent chest pain. No dyspnea. Right radial cath site healed well w/o complications. She reports full med compliance except for Zetia. It is not covered by her insurance and is too expensive. Of note, Zetia was first prescribed a few years ago as her LDL was not at goal, at 105, on high dose Lipitor. Her most recent  lipid panel 03/2016 showed an LDL of 35. HFTs were normal.    Current Meds  Medication Sig  . allopurinol (ZYLOPRIM) 300 MG tablet Take 300 mg by mouth daily.  Marland Kitchen aspirin 81 MG tablet Take 81 mg by mouth daily.    Marland Kitchen atorvastatin (LIPITOR) 40 MG tablet Take 2 tablets (80 mg total) by mouth daily.  . clopidogrel (PLAVIX) 75 MG tablet take 1 tablet by mouth once daily  . desloratadine (CLARINEX) 5 MG tablet Take 5 mg by mouth daily.  . diazepam (VALIUM) 5 MG tablet Take 1 tablet by mouth daily.  . folic acid (FOLVITE) 1 MG tablet Take 1 mg by mouth daily. Takes with chemo medication.  . gabapentin (NEURONTIN) 300 MG capsule Take 1 capsule (300 mg total) by mouth 3 (three) times daily.  Marland Kitchen leflunomide (ARAVA) 20 MG tablet Take 20 mg by mouth daily.  . metoprolol (LOPRESSOR) 50 MG tablet take 1 tablet by mouth once daily  . NEXIUM 40 MG capsule Take 1 capsule by mouth daily as needed. For acid reflux  . nitroGLYCERIN (NITROSTAT) 0.4 MG SL tablet Place 1 tablet (0.4 mg total) under the tongue every 5 (five) minutes as needed for chest pain.  . Omega-3 Fatty Acids (FISH OIL) 1000 MG CAPS Take by mouth daily.    . predniSONE (DELTASONE) 5 MG tablet Take 10 mg by mouth daily with breakfast.   . ramipril (ALTACE) 2.5 MG capsule Take 1 capsule (2.5 mg total) by mouth daily.  . sennosides-docusate sodium (SENOKOT-S) 8.6-50 MG tablet Take 2 tablets by mouth daily.  . Tofacitinib Citrate (XELJANZ) 5 MG TABS Take 2 tablets  by mouth.  . traMADol (ULTRAM) 50 MG tablet Take 1 tablet (50 mg total) by mouth 4 (four) times daily.  Marland Kitchen zolpidem (AMBIEN) 5 MG tablet Take 5 mg by mouth at bedtime.  . [DISCONTINUED] furosemide (LASIX) 40 MG tablet Take 1 tablet (40 mg total) by mouth daily.   Allergies  Allergen Reactions  . Codeine Phosphate Shortness Of Breath    REACTION: unspecified  . Amoxicillin Nausea And Vomiting    REACTION: unspecified  . Penicillins Nausea And Vomiting    Has patient had a PCN reaction  causing immediate rash, facial/tongue/throat swelling, SOB or lightheadedness with hypotension:YES Has patient had a PCN reaction causing severe rash involving mucus membranes or skin necrosis: NO Has patient had a PCN reaction that required hospitalization NO Has patient had a PCN reaction occurring within the last 10 years: NO If all of the above answers are "NO", then may proceed with Cephalosporin use.  . Lidocaine Rash    Patch caused rash   Past Medical History:  Diagnosis Date  . CAD (coronary artery disease)    a. PTCA LAD 2001/cath 2002-prox and mid LAD stents patent, PTCA ostium Dx 1 b. cath 03/2016: patent LAD stent with less than 40% in-stent restenosis, 10-30% stenosis of D1 and distal LAD with continued medical management recommended.  . Carotid artery disease ([place])    Doppler September, 2011, 0-39% bilateral disease mild  . Edema   . Emotional stress reaction    8/11  . HTN (hypertension)   . Hyperlipidemia   . Leg pain    July, 2012, Arterial Dopplers March 08, 2011 normal  . 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 PONV (postoperative nausea and vomiting)   . Rheumatoid arthritis ([place])   . Right rotator cuff tear 11/16/2013  . Stroke [facility])    mini stroke in past ???   Family History  Problem Relation Age of Onset  . Heart attack Mother   . Heart disease Mother   . Diabetes Brother   . Heart disease Brother   . Diabetes Sister   . Arthritis/Rheumatoid Father   . Other Father     hepatic cirrhosis  . Heart disease Maternal Aunt   . Cancer Maternal Aunt     throat  . Heart attack Maternal Aunt   . Heart attack Maternal Uncle   . Heart disease Brother   . Cancer      family history  . Heart attack      family history  . Cancer Maternal Grandmother     colon  . Cancer Maternal Grandfather     throat   Past Surgical History:  Procedure  Laterality Date  . CARDIAC CATHETERIZATION     with stent placement in 2001  . CARDIAC CATHETERIZATION N/A 03/12/2016   Procedure: Left Heart Cath and Coronary Angiography;  Surgeon: Nelva Bush, MD;  Location: [facility];  Service: Cardiovascular;  Laterality: N/A;  . CARDIAC CATHETERIZATION N/A 03/12/2016   Procedure: Intravascular Pressure Wire/FFR Study;  Surgeon: Nelva Bush, MD;  Location: [facility];  Service: Cardiovascular;  Laterality: N/A;  . CHOLECYSTECTOMY    . CORONARY ANGIOPLASTY     2002  . laproscopic adjustable gastric  banding     with APS standard system.   . Pt has 3 stents     sept 2001  . SHOULDER ARTHROSCOPY  WITH SUBACROMIAL DECOMPRESSION, ROTATOR CUFF REPAIR AND BICEP TENDON REPAIR Right 11/16/2013   Procedure: RIGHT SHOULDER ARTHROSCOPY, EXTENSIVE DEBRIDEMENT, ROTATOR CUFF REPAIR ;  Surgeon: [name], MD;  Location: [facility];  Service: Orthopedics;  Laterality: Right;  . TUBAL LIGATION     Social History   Social History  . Marital status: Married    Spouse name: N/A  . Number of children: N/A  . Years of education: N/A   Occupational History  . Not on file.   Social History Main Topics  . Smoking status: Never Smoker  . Smokeless tobacco: Never Used  . Alcohol use No  . Drug use: No  . Sexual activity: No   Other Topics Concern  . Not on file   Social History Narrative  . No narrative on file     Review of Systems: General: negative for chills, fever, night sweats or weight changes.  Cardiovascular: negative for chest pain, dyspnea on exertion, edema, orthopnea, palpitations, paroxysmal nocturnal dyspnea or shortness of breath Dermatological: negative for rash Respiratory: negative for cough or wheezing Urologic: negative for hematuria Abdominal: negative for nausea, vomiting, diarrhea, bright red blood per rectum, melena, or hematemesis Neurologic: negative for visual changes, syncope, or  dizziness All other systems reviewed and are otherwise negative except as noted above.   Physical Exam:  Blood pressure 120/84, pulse 72, height 5\' 2"  (1.575 m), weight 225 lb (102.1 kg).  General appearance: alert, cooperative, no distress and moderately obese Neck: no carotid bruit and no JVD Lungs: clear to auscultation bilaterally Heart: regular rate and rhythm, S1, S2 normal, no murmur, click, rub or gallop Extremities: extremities normal, atraumatic, no cyanosis or edema Pulses: 2+ and symmetric Skin: Skin color, texture, turgor normal. No rashes or lesions Neurologic: Grossly normal  EKG NSR. R 76 bpm. No ischemic abnormalities.   ASSESSMENT AND PLAN:    1. CAD: stable cath 03/2016. moderate 40% in-stent restenosis of proximal LAD stent, which was not hemodynamically significant with an FFR of 0.84. Mild disease of 10-30% stenosis involving ostial D1 and mid/distal LAD stent was noted. LV function was normal and overall continued medical management with aggressive risk factor modification was recommended. She is stable w/o recurrent CP. Continue ASA, Plavix, metoprolol and Lipitor.   2. HLD: LDL was at goal of < 70 mg/dL 03/2016 at 35 mg/dL. She has been unable to afford Zetia (was added when LDL was 105 on high dose Lipitor). She has been w/o Zetia for several months. We will check a repeat FLP and CMP today. If LDL is still at goal, can d/c Zetia. Low fat diet + exercise encouraged.   3. HLD: BP is well controlled.  4. Chronic Diastolic HF: volume stable. Continue daily lasix, 40 mg daily. She monitors her weight regularly and takes additional Lasix PRN. CMP today to check renal function and K.   5. RA: followed by a rheumatologist.    PLAN Keep yearly f/u with Dr. Meda Coffee in May.   Lyda Jester PA-C 07/15/2016 11:52 AM

## 2016-07-16 LAB — COMPREHENSIVE METABOLIC PANEL
A/G RATIO: 1.5 (ref 1.2–2.2)
ALK PHOS: 109 IU/L (ref 39–117)
ALT: 10 IU/L (ref 0–32)
AST: 9 IU/L (ref 0–40)
Albumin: 4.1 g/dL (ref 3.6–4.8)
BILIRUBIN TOTAL: 0.4 mg/dL (ref 0.0–1.2)
BUN/Creatinine Ratio: 18 (ref 12–28)
BUN: 11 mg/dL (ref 8–27)
CHLORIDE: 91 mmol/L — AB (ref 96–106)
CO2: 27 mmol/L (ref 18–29)
Calcium: 9.4 mg/dL (ref 8.7–10.3)
Creatinine, Ser: 0.62 mg/dL (ref 0.57–1.00)
GFR calc Af Amer: 109 mL/min/{1.73_m2} (ref >59)
GFR calc non Af Amer: 95 mL/min/{1.73_m2} (ref >59)
GLUCOSE: 104 mg/dL — AB (ref 65–99)
Globulin, Total: 2.7 g/dL (ref 1.5–4.5)
Potassium: 3.5 mmol/L (ref 3.5–5.2)
Sodium: 141 mmol/L (ref 134–144)
TOTAL PROTEIN: 6.8 g/dL (ref 6.0–8.5)

## 2016-07-16 LAB — LIPID PANEL
CHOLESTEROL TOTAL: 201 mg/dL — AB (ref 100–199)
Chol/HDL Ratio: 2.7 ratio units (ref 0.0–4.4)
HDL: 74 mg/dL (ref >39)
LDL Calculated: 96 mg/dL (ref 0–99)
TRIGLYCERIDES: 157 mg/dL — AB (ref 0–149)
VLDL CHOLESTEROL CAL: 31 mg/dL (ref 5–40)

## 2016-07-22 ENCOUNTER — Telehealth: Payer: Self-pay | Admitting: Cardiology

## 2016-07-22 NOTE — Telephone Encounter (Signed)
Ms. Priem is calling and would like to receive a call back in regards to her blood work results. Please call, if you cant reach a mobile # [phone] then please call home [phone].Thanks.

## 2016-07-22 NOTE — Telephone Encounter (Signed)
Pt calling to receive lab results and recommendations from 07/15/16.  Pt states she saw Ellen Henri PA-C on 07/15/16, and labs were drawn same day.  Pt is calling to receive results from 1/4. Pt is mostly concerned with her lipids and discussing a further plan in regards to treating this, for she states she is already taking Lipitor 80 mg po daily.  Informed the pt that I will route this message to [place] PA-C about requested lab results and plan, and someone from the office will follow-up with her shortly thereafter.  Pt verbalized understanding and agrees with this plan.

## 2016-07-29 NOTE — Progress Notes (Signed)
Patient ID: Cindy Holmes                 DOB: 02/24/1951                    MRN: [id number]     HPI: Cindy Holmes is a 66 y.o. female patient of Dr Meda Coffee referred to lipid clinic by Lyda Jester, PA. PMH is significant for CAD s/p BMS to prox and mid LAD as well as PTCA of D1 in 2001 with repeat PTCA to ostial D1 in 2002, stroke, heart failure with preserved ejection fraction, HTN, HLD, rheumatoid arthritis, and morbid obesity. LHC 03/12/16 showed moderate in-stent restenosis of proximal LAD stent which was not hemodynamically significant, to be treated with medical management.  Pt currently takes Lipitor 80mg  daily (2 of the 40mg  tablets each day because she can swallow them easier). Most recent LDL was 96. Pt used to take Zetia in addition to Lipitor and her LDL dropped to 35 on both therapies. Zetia became too expensive in 2017 for pt to continue taking.  She has a strong family history of heart disease. Her mother passed from an MI, she has 2 brothers who had quadruple bypass, and her son had 8 coronary blockages in his 46s.   Current Medications: Lipitor 80mg  daily Risk Factors: CAD s/p PCI, obesity, HTN LDL goal: 70mg /dL  Family History: Mother with MI and heart disease. Brother with DM and heart disease. Sister with DM. Father with RA and hepatic cirrhosis. Maternal aunt with heart disease and MI. Maternal uncle with MI. Brother with heart disease.    Social History: Denies tobacco use, alcohol use, or illicit drug use.  Labs: 07/15/16: TC 201, TG 157, HDL 74, LDL 96 (Lipitor 80mg  daily)  Past Medical History:  Diagnosis Date  . CAD (coronary artery disease)    a. PTCA LAD 2001/cath 2002-prox and mid LAD stents patent, PTCA ostium Dx 1 b. cath 03/2016: patent LAD stent with less than 40% in-stent restenosis, 10-30% stenosis of D1 and distal LAD with continued medical management recommended.  . Carotid artery disease ([place])    Doppler September, 2011, 0-39% bilateral disease  mild  . Edema   . Emotional stress reaction    8/11  . HTN (hypertension)   . Hyperlipidemia   . Leg pain    July, 2012, Arterial Dopplers March 08, 2011 normal  . 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 PONV (postoperative nausea and vomiting)   . Rheumatoid arthritis ([place])   . Right rotator cuff tear 11/16/2013  . Stroke [facility])    mini stroke in past ???    Current Outpatient Prescriptions on File Prior to Visit  Medication Sig Dispense Refill  . allopurinol (ZYLOPRIM) 300 MG tablet Take 300 mg by mouth daily.  0  . aspirin 81 MG tablet Take 81 mg by mouth daily.      Marland Kitchen atorvastatin (LIPITOR) 40 MG tablet Take 2 tablets (80 mg total) by mouth daily. 180 tablet 3  . clopidogrel (PLAVIX) 75 MG tablet take 1 tablet by mouth once daily 90 tablet 2  . desloratadine (CLARINEX) 5 MG tablet Take 5 mg by mouth daily.    . diazepam (VALIUM) 5 MG tablet Take 1 tablet by mouth daily.    . folic acid (FOLVITE) 1 MG tablet Take 1 mg by mouth daily. Takes  with chemo medication.    . furosemide (LASIX) 40 MG tablet Take 1 tablet (40 mg total) by mouth daily. Take extra 40 mg for weight gain 3 # x 1 day 135 tablet 3  . gabapentin (NEURONTIN) 300 MG capsule Take 1 capsule (300 mg total) by mouth 3 (three) times daily. 90 capsule 1  . leflunomide (ARAVA) 20 MG tablet Take 20 mg by mouth daily.    . metoprolol (LOPRESSOR) 50 MG tablet take 1 tablet by mouth once daily 90 tablet 11  . NEXIUM 40 MG capsule Take 1 capsule by mouth daily as needed. For acid reflux    . nitroGLYCERIN (NITROSTAT) 0.4 MG SL tablet Place 1 tablet (0.4 mg total) under the tongue every 5 (five) minutes as needed for chest pain. 25 tablet 3  . Omega-3 Fatty Acids (FISH OIL) 1000 MG CAPS Take by mouth daily.      . predniSONE (DELTASONE) 5 MG tablet Take 10 mg by mouth daily with breakfast.     . ramipril (ALTACE)  2.5 MG capsule Take 1 capsule (2.5 mg total) by mouth daily. 90 capsule 3  . sennosides-docusate sodium (SENOKOT-S) 8.6-50 MG tablet Take 2 tablets by mouth daily. 30 tablet 1  . Tofacitinib Citrate (XELJANZ) 5 MG TABS Take 2 tablets by mouth.    . traMADol (ULTRAM) 50 MG tablet Take 1 tablet (50 mg total) by mouth 4 (four) times daily. 120 tablet 1  . zolpidem (AMBIEN) 5 MG tablet Take 5 mg by mouth at bedtime.  0   No current facility-administered medications on file prior to visit.     Allergies  Allergen Reactions  . Codeine Phosphate Shortness Of Breath    REACTION: unspecified  . Amoxicillin Nausea And Vomiting    REACTION: unspecified  . Penicillins Nausea And Vomiting    Has patient had a PCN reaction causing immediate rash, facial/tongue/throat swelling, SOB or lightheadedness with hypotension:YES Has patient had a PCN reaction causing severe rash involving mucus membranes or skin necrosis: NO Has patient had a PCN reaction that required hospitalization NO Has patient had a PCN reaction occurring within the last 10 years: NO If all of the above answers are "NO", then may proceed with Cephalosporin use.  . Lidocaine Rash    Patch caused rash    Assessment/Plan:  1. Hyperlipidemia - LDL 96 on Lipitor 80mg /dL, above goal 70mg /dL given history of ASCVD. Looked through pt's 2018 [place] is a Tier 2 and a 90 day supply from [facility] is affordable at $18. Will start Zetia 10mg  daily and continue Lipitor, which should bring LDL to goal. Will recheck lipids in 2 months.  Pt signed informed consent for [facility] lipid registry.  Modine Oppenheimer E. Ronnica Dreese, PharmD, CPP, [place] [id number] [address] [place], [place] [address] Phone: [phone]; Fax: [phone] 07/30/2016 10:38 AM

## 2016-07-30 ENCOUNTER — Encounter: Payer: Self-pay | Admitting: *Deleted

## 2016-07-30 ENCOUNTER — Encounter: Payer: Self-pay | Admitting: Pharmacist

## 2016-07-30 ENCOUNTER — Ambulatory Visit (INDEPENDENT_AMBULATORY_CARE_PROVIDER_SITE_OTHER): Payer: Medicare Other | Admitting: Pharmacist

## 2016-07-30 VITALS — BP 120/72 | HR 69 | Ht 62.0 in | Wt 226.0 lb

## 2016-07-30 DIAGNOSIS — I2510 Atherosclerotic heart disease of native coronary artery without angina pectoris: Secondary | ICD-10-CM | POA: Diagnosis not present

## 2016-07-30 DIAGNOSIS — E785 Hyperlipidemia, unspecified: Secondary | ICD-10-CM

## 2016-07-30 DIAGNOSIS — Z006 Encounter for examination for normal comparison and control in clinical research program: Secondary | ICD-10-CM

## 2016-07-30 MED ORDER — EZETIMIBE 10 MG PO TABS: 10.0000 mg | ORAL_TABLET | Freq: Every day | ORAL | 3 refills | Status: DC

## 2016-07-30 NOTE — Patient Instructions (Signed)
Continue taking your Lipitor 40mg  2 tablets daily.  Start taking Zetia (ezetimibe) 10mg  daily.  We will recheck your cholesterol Thursday March 22nd. Come in any time after 7:30am for fasting blood work.

## 2016-07-30 NOTE — Progress Notes (Signed)
Subject met inclusion and exclusion criteria. The informed consent form, study requirements and expectations were reviewed with the subject and questions and concerns were addressed prior to the signing of the consent form. The subject verbalized understanding of the trail requirements. The subject agreed to participate in the [facility] Registryand signed the informed consent. The informed consent was obtained prior to performance of any protocol-specific procedures for the subject. A copy of the signed informed consent was given to the subject and a copy was placed in the subject's medical record.  Jake Bathe, RN 07/30/2016 1000 am

## 2016-08-06 ENCOUNTER — Encounter (HOSPITAL_COMMUNITY): Payer: Self-pay | Admitting: Nurse Practitioner

## 2016-08-07 ENCOUNTER — Other Ambulatory Visit: Payer: Self-pay | Admitting: Cardiology

## 2016-08-11 ENCOUNTER — Encounter (HOSPITAL_COMMUNITY): Payer: Self-pay | Admitting: Nurse Practitioner

## 2016-08-11 NOTE — Telephone Encounter (Signed)
Close encounter 

## 2016-08-25 ENCOUNTER — Other Ambulatory Visit: Payer: Self-pay

## 2016-08-25 MED ORDER — ATORVASTATIN CALCIUM 80 MG PO TABS: 80.0000 mg | ORAL_TABLET | Freq: Every day | ORAL | 3 refills | Status: DC

## 2016-08-25 NOTE — Telephone Encounter (Signed)
Atorvastatin 40mg  2 po daily was requiring a PA. New Rx sent for Atorvastatin 80 mg 1 po daily, to [facility]. This should not require a PA.

## 2016-09-01 ENCOUNTER — Other Ambulatory Visit: Payer: Self-pay | Admitting: *Deleted

## 2016-09-01 ENCOUNTER — Encounter: Payer: Self-pay | Admitting: *Deleted

## 2016-09-01 ENCOUNTER — Telehealth: Payer: Self-pay | Admitting: Cardiology

## 2016-09-01 MED ORDER — ATORVASTATIN CALCIUM 40 MG PO TABS: 80.0000 mg | ORAL_TABLET | Freq: Every day | ORAL | 0 refills | Status: DC

## 2016-09-01 NOTE — Telephone Encounter (Signed)
Spoke with [employer] and informed them that the pt will need atorvastatin 40 mg tablets and to take 2 of these tablets to equal 80 mg po daily, for recent stroke and difficulty in swallowing the 80 mg tabs of atorvastatin.  Per the representative at [facility], they will be faxing over an order for the pt to take atorvastatin 40 mg tabs, take 2 tablets to equal 80 mg po daily.  Per the Rep with [facility], in the fax they will send to [phone], Dr Meda Coffee will need to sign this and explain in a small note that the pt has dysphagia, due to history of stroke, and will need atorvastatin 40 mg tablets to take vs the 80 mg tablets.  Per the Rep at [facility], we will need to fax this in when complete, and then they will base their decision on information received.  [facility] will notify our office via fax and mail the pt a letter if this is approved or not.  Will follow-up with the pt accordingly.

## 2016-09-01 NOTE — Telephone Encounter (Signed)
Informed the pt of below message about her atorvastatin and that we called [facility] this morning to follow-up on this:   Spoke with [facility] and informed them that the pt will need atorvastatin 40 mg tablets and to take 2 of these tablets to equal 80 mg po daily, for recent stroke and difficulty in swallowing the 80 mg tabs of atorvastatin.  Per the representative at [facility], they will be faxing over an order for the pt to take atorvastatin 40 mg tabs, take 2 tablets to equal 80 mg po daily.  Per the Rep with [facility], in the fax they will send to [phone], Dr Meda Coffee will need to sign this and explain in a small note that the pt has dysphagia, due to history of stroke, and will need atorvastatin 40 mg tablets to take vs the 80 mg tablets.  Per the Rep at [facility], we will need to fax this in when complete, and then they will base their decision on information received.  [facility] will notify our office via fax and mail the pt a letter if this is approved or not.  Will follow-up with the pt accordingly.      Pt is aware that we are working on getting this approved through [facility].   Informed the pt that she will be notified by our Prior Auth Nurse, myself, or [facility], if this is approved.   Pt requesting a month supply to be sent to [facility] in [place], until approval is met with her 60 day supplier.  Pt only has 3 days worth of this med left.   Informed the pt that I will send this in now.   Pt verbalized understanding and agrees with this plan.  Pt gracious for all the assistance provided.

## 2016-09-01 NOTE — Telephone Encounter (Signed)
Pt is aware that we are working on getting this approved through [facility].   Informed the pt that she will be notified by our Prior Auth Nurse, myself, or [facility], if this is approved.   Pt requesting a month supply to be sent to [facility] in [place], until approval is met with her 16 day supplier.  Pt only has 3 days worth of this med left.   Informed the pt that I will send this in now.  Sent in a months supply only to [facility] for the pt to have atorvastatin 40 mg tablets, take 2 tabs (80 mg total)  po daily. Note to pharmacy to fill 40 mg tabs due to difficulty in swallowing the 80 mg tabs and dysphagia.   Pt verbalized understanding and agrees with this plan.  Pt gracious for all the assistance provided.

## 2016-09-01 NOTE — Telephone Encounter (Signed)
New message    Pt calling about paper work she faxed yesterday-to see if it was received. She states the paper was about her medication. She states she would like a call from Rn.

## 2016-09-03 NOTE — Telephone Encounter (Signed)
**Note De-Identified  Obfuscation** The pt is aware that her Atorvastatin has been approved. She verbalized understanding and was appreciative of call.

## 2016-09-03 NOTE — Telephone Encounter (Signed)
**Note De-Identified  Obfuscation** We received an Approval by fax from [facility] stating that the pts Cindy Holmes has been approved through 07/11/17.  Approval letter has been sent to medical records to be scanned into the pts chart.  The pt is aware.

## 2016-09-07 ENCOUNTER — Telehealth: Payer: Self-pay | Admitting: Cardiology

## 2016-09-07 NOTE — Telephone Encounter (Signed)
Patient is calling and states that she needs atorvastatin 40 mg tablets and to take 2 of these tablets to equal 80 mg po daily, for recent stroke and difficulty in swallowing the 80 mg tabs of atorvastatin. The patient states that they have been working on this for a while and it was suppose to be taken care of already.The patient states that she recently received a letter from [facility] stating that she has been approved for the 40mg  tablets, but the patient states that it is only for a 30 day supply and what she needs is approval for a 90 day supply. Patient is requesting that Dr. Francesca Oman nurse call her. Message routed to Dr. Francesca Oman nurse.

## 2016-09-07 NOTE — Telephone Encounter (Signed)
New message ° ° ° °Pt verbalized that she is returning call for rn  °

## 2016-09-08 ENCOUNTER — Other Ambulatory Visit: Payer: Self-pay

## 2016-09-08 MED ORDER — ATORVASTATIN CALCIUM 40 MG PO TABS: 80.0000 mg | ORAL_TABLET | Freq: Every day | ORAL | 3 refills | Status: DC

## 2016-09-08 NOTE — Telephone Encounter (Signed)
Will forward this concern to Prior Auth Nurse for further review and follow-up.

## 2016-09-08 NOTE — Telephone Encounter (Signed)
The pt is advised that I have spoken to 3 people this morning at [facility] concerning her Atorvastatin refill. It appears that her Atorvastatin 40 mg (Take 2 tablets daily) was sent in for only a 30 day supply and not 90 day supply. I was advised by the last person I spoke with at [facility] to send in another refill request to them with message to pharmacist that the last refill was sent in error and should have been for a 90 day supply and that they will send the pt a 60 day supply to go with the 30 day she has already received on her last refill and that her following refills will be for a 90 day supply. She verbalized understanding and thanked me for calling her back with update.  RX for Atorvastatin 40 mg with instructions to take 2 tablets daily #180 with 3 refills with message to pharmacist that prior Atorvastatin refill was sent in error and should have been for a 90 day supply.

## 2016-09-30 ENCOUNTER — Other Ambulatory Visit: Payer: Medicare Other | Admitting: *Deleted

## 2016-09-30 DIAGNOSIS — E785 Hyperlipidemia, unspecified: Secondary | ICD-10-CM | POA: Diagnosis not present

## 2016-09-30 LAB — HEPATIC FUNCTION PANEL
ALT: 30 IU/L (ref 0–32)
AST: 22 IU/L (ref 0–40)
Albumin: 3.9 g/dL (ref 3.6–4.8)
Alkaline Phosphatase: 94 IU/L (ref 39–117)
Bilirubin Total: 0.4 mg/dL (ref 0.0–1.2)
Bilirubin, Direct: 0.14 mg/dL (ref 0.00–0.40)
Total Protein: 6.2 g/dL (ref 6.0–8.5)

## 2016-09-30 LAB — LIPID PANEL
Chol/HDL Ratio: 2.2 ratio units (ref 0.0–4.4)
Cholesterol, Total: 164 mg/dL (ref 100–199)
HDL: 74 mg/dL (ref >39)
LDL Calculated: 68 mg/dL (ref 0–99)
Triglycerides: 110 mg/dL (ref 0–149)
VLDL Cholesterol Cal: 22 mg/dL (ref 5–40)

## 2016-10-11 ENCOUNTER — Encounter (HOSPITAL_COMMUNITY): Payer: Self-pay | Admitting: Emergency Medicine

## 2016-10-11 ENCOUNTER — Emergency Department (HOSPITAL_COMMUNITY): Payer: Medicare Other

## 2016-10-11 ENCOUNTER — Emergency Department (HOSPITAL_COMMUNITY)
Admission: EM | Admit: 2016-10-11 | Discharge: 2016-10-11 | Disposition: A | Discharge status: Home / Self Care | Payer: Medicare Other | Attending: Emergency Medicine | Admitting: Emergency Medicine

## 2016-10-11 DIAGNOSIS — R0781 Pleurodynia: Secondary | ICD-10-CM | POA: Diagnosis not present

## 2016-10-11 DIAGNOSIS — S5002XA Contusion of left elbow, initial encounter: Secondary | ICD-10-CM | POA: Diagnosis not present

## 2016-10-11 DIAGNOSIS — M25562 Pain in left knee: Secondary | ICD-10-CM | POA: Insufficient documentation

## 2016-10-11 DIAGNOSIS — M79605 Pain in left leg: Secondary | ICD-10-CM | POA: Diagnosis not present

## 2016-10-11 DIAGNOSIS — S60212A Contusion of left wrist, initial encounter: Secondary | ICD-10-CM | POA: Insufficient documentation

## 2016-10-11 DIAGNOSIS — W1809XA Striking against other object with subsequent fall, initial encounter: Secondary | ICD-10-CM | POA: Insufficient documentation

## 2016-10-11 DIAGNOSIS — Y92481 Parking lot as the place of occurrence of the external cause: Secondary | ICD-10-CM | POA: Diagnosis not present

## 2016-10-11 DIAGNOSIS — R0789 Other chest pain: Secondary | ICD-10-CM | POA: Insufficient documentation

## 2016-10-11 DIAGNOSIS — I110 Hypertensive heart disease with heart failure: Secondary | ICD-10-CM | POA: Insufficient documentation

## 2016-10-11 DIAGNOSIS — W19XXXA Unspecified fall, initial encounter: Secondary | ICD-10-CM

## 2016-10-11 DIAGNOSIS — M25571 Pain in right ankle and joints of right foot: Secondary | ICD-10-CM | POA: Insufficient documentation

## 2016-10-11 DIAGNOSIS — I5033 Acute on chronic diastolic (congestive) heart failure: Secondary | ICD-10-CM | POA: Diagnosis not present

## 2016-10-11 DIAGNOSIS — Y999 Unspecified external cause status: Secondary | ICD-10-CM | POA: Diagnosis not present

## 2016-10-11 DIAGNOSIS — Z79899 Other long term (current) drug therapy: Secondary | ICD-10-CM | POA: Insufficient documentation

## 2016-10-11 DIAGNOSIS — Z7982 Long term (current) use of aspirin: Secondary | ICD-10-CM | POA: Insufficient documentation

## 2016-10-11 DIAGNOSIS — S6992XA Unspecified injury of left wrist, hand and finger(s), initial encounter: Secondary | ICD-10-CM | POA: Diagnosis not present

## 2016-10-11 DIAGNOSIS — M791 Myalgia, unspecified site: Secondary | ICD-10-CM

## 2016-10-11 DIAGNOSIS — M25532 Pain in left wrist: Secondary | ICD-10-CM | POA: Diagnosis not present

## 2016-10-11 DIAGNOSIS — Y9301 Activity, walking, marching and hiking: Secondary | ICD-10-CM | POA: Insufficient documentation

## 2016-10-11 DIAGNOSIS — S99912A Unspecified injury of left ankle, initial encounter: Secondary | ICD-10-CM | POA: Diagnosis not present

## 2016-10-11 DIAGNOSIS — S299XXA Unspecified injury of thorax, initial encounter: Secondary | ICD-10-CM | POA: Diagnosis not present

## 2016-10-11 DIAGNOSIS — S8992XA Unspecified injury of left lower leg, initial encounter: Secondary | ICD-10-CM | POA: Diagnosis not present

## 2016-10-11 DIAGNOSIS — S99911A Unspecified injury of right ankle, initial encounter: Secondary | ICD-10-CM | POA: Diagnosis not present

## 2016-10-11 DIAGNOSIS — M25522 Pain in left elbow: Secondary | ICD-10-CM | POA: Diagnosis not present

## 2016-10-11 DIAGNOSIS — T07XXXA Unspecified multiple injuries, initial encounter: Secondary | ICD-10-CM

## 2016-10-11 DIAGNOSIS — S59902A Unspecified injury of left elbow, initial encounter: Secondary | ICD-10-CM | POA: Diagnosis not present

## 2016-10-11 DIAGNOSIS — M7989 Other specified soft tissue disorders: Secondary | ICD-10-CM | POA: Diagnosis not present

## 2016-10-11 MED ORDER — IBUPROFEN 800 MG PO TABS
800.0000 mg | ORAL_TABLET | Freq: Once | ORAL | Status: AC
  Administered 2016-10-11: 800 mg via ORAL
  Filled 2016-10-11: qty 1

## 2016-10-11 MED ORDER — OXYCODONE-ACETAMINOPHEN 5-325 MG PO TABS
1.0000 | ORAL_TABLET | Freq: Once | ORAL | Status: DC
  Filled 2016-10-11: qty 1

## 2016-10-11 NOTE — ED Notes (Signed)
Pt refused percocet, states it makes her nauseous and sob. Pt states she can only take dilaudid or demerol. EDP notified. Pt offered tylenol or ibuprofen.

## 2016-10-11 NOTE — ED Triage Notes (Signed)
Pt right foot twisted and fell onto left side on concrete x 2 days ago. States whole body is sore. c/o left elbow and left leg pain is worse. nad. Has been walking since fall, using walker some

## 2016-10-11 NOTE — ED Notes (Signed)
In room to discharge pt and pt not in room.

## 2016-10-11 NOTE — ED Provider Notes (Signed)
[place] [facility] DEPT Provider Note   CSN: [id number] Arrival date & time: 10/11/16  0944  By signing my name below, I, Ethelle Lyon Long, attest that this documentation has been prepared under the direction and in the presence of Pattricia Boss, MD. Electronically Signed: Ethelle Lyon Long, Scribe. 10/11/2016. 1:13 PM.   History   Chief Complaint Chief Complaint  Patient presents with  . Fall   The history is provided by the patient, medical records and the spouse. No language interpreter was used.   HPI Comments:  Cindy Holmes is an obese 66 y.o. female with a PMHx of CAD, RA, HLD, HTN, and CVA, who presents to the Emergency Department complaining of 10/10 right foot pain s/p a fall two days ago. She reports walking across the parking lot when she twisted her right ankle and fell, striking her left side on the concrete. She was unable to move following the fall for about 10 minutes until help arrived. Denies LOC or head injury. She was able to ambulate s/p the fall but has gradually had increasing trouble ambulating since that time with assistance now required from her husband. Pt has associated symptoms of left elbow, left wrist, left knee, and bilateral ankle pain. She has tried Tylenol at home with minimal relief of her symptoms. Pt denies any other complaints or injuries at this time.  PCP: [place], [place]    Past Medical History:  Diagnosis Date  . CAD (coronary artery disease)    a. PTCA LAD 2001/cath 2002-prox and mid LAD stents patent, PTCA ostium Dx 1 b. cath 03/2016: patent LAD stent with less than 40% in-stent restenosis, 10-30% stenosis of D1 and distal LAD with continued medical management recommended.  . Carotid artery disease ([place])    Doppler September, 2011, 0-39% bilateral disease mild  . Edema   . Emotional stress reaction    8/11  . HTN (hypertension)   . Hyperlipidemia   . Leg pain    July, 2012, Arterial Dopplers March 08, 2011 normal  .  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 PONV (postoperative nausea and vomiting)   . Rheumatoid arthritis ([place])   . Right rotator cuff tear 11/16/2013  . Stroke [facility])    mini stroke in past ???    Patient Active Problem List   Diagnosis Date Noted  . Coronary artery disease involving native coronary artery of native heart without angina pectoris 11/13/2015  . Chest pain 11/13/2015  . Hyperlipemia 11/13/2015  . Acute on chronic diastolic CHF (congestive heart failure), NYHA class 3 ([place]) 11/13/2015  . Pain in joint, shoulder region 01/16/2014  . Muscle weakness (generalized) 01/16/2014  . Decreased range of motion of right shoulder 01/16/2014  . Right rotator cuff tear 11/16/2013  . Rotator cuff tear 11/16/2013  . Rheumatoid arthritis ([place])   . CAD (coronary artery disease)   . HTN (hypertension)   . Hyperlipidemia   . Stroke ([place])   . Emotional stress reaction   . Leg pain   . Carotid artery disease ([place])   . OVERWEIGHT/OBESITY 11/04/2008  . EDEMA 11/04/2008    Past Surgical History:  Procedure Laterality Date  . CARDIAC CATHETERIZATION     with stent placement in 2001  . CARDIAC CATHETERIZATION N/A 03/12/2016   Procedure: Left Heart Cath and Coronary Angiography;  Surgeon: Nelva Bush, MD;  Location: [place] CV LAB;  Service: Cardiovascular;  Laterality: N/A;  . CARDIAC CATHETERIZATION N/A 03/12/2016   Procedure: Intravascular Pressure Wire/FFR Study;  Surgeon: Nelva Bush, MD;  Location: [place] [facility] LAB;  Service: Cardiovascular;  Laterality: N/A;  . CHOLECYSTECTOMY    . CORONARY ANGIOPLASTY     2002  . laproscopic adjustable gastric  banding     with APS standard system.   . Pt has 3 stents     sept 2001  . SHOULDER ARTHROSCOPY WITH SUBACROMIAL DECOMPRESSION, ROTATOR CUFF REPAIR AND BICEP TENDON REPAIR Right 11/16/2013   Procedure: RIGHT  SHOULDER ARTHROSCOPY, EXTENSIVE DEBRIDEMENT, ROTATOR CUFF REPAIR ;  Surgeon: [name], MD;  Location: [facility];  Service: Orthopedics;  Laterality: Right;  . TUBAL LIGATION      OB History    No data available     Home Medications    Prior to Admission medications   Medication Sig Start Date End Date Taking? Authorizing Provider  allopurinol (ZYLOPRIM) 300 MG tablet Take 300 mg by mouth daily. 01/07/15   Historical Provider, MD  aspirin 81 MG tablet Take 81 mg by mouth daily.      Historical Provider, MD  atorvastatin (LIPITOR) 40 MG tablet Take 2 tablets (80 mg total) by mouth daily. 09/08/16 12/07/16  Dorothy Spark, MD  clopidogrel (PLAVIX) 75 MG tablet TAKE 1 TABLET BY MOUTH  EVERY DAY 08/09/16   Dorothy Spark, MD  desloratadine (CLARINEX) 5 MG tablet Take 5 mg by mouth daily.    Historical Provider, MD  diazepam (VALIUM) 5 MG tablet Take 1 tablet by mouth daily. 02/25/14   Historical Provider, MD  ezetimibe (ZETIA) 10 MG tablet Take 1 tablet (10 mg total) by mouth daily. 07/30/16 10/28/16  Dorothy Spark, MD  folic acid (FOLVITE) 1 MG tablet Take 1 mg by mouth daily. Takes with chemo medication. 03/05/14   Historical Provider, MD  furosemide (LASIX) 40 MG tablet Take 1 tablet (40 mg total) by mouth daily. Take extra 40 mg for weight gain 3 # x 1 day 07/15/16 10/13/16  Brittainy Erie Noe, PA-C  gabapentin (NEURONTIN) 300 MG capsule Take 1 capsule (300 mg total) by mouth 3 (three) times daily. 06/27/14   Charlett Blake, MD  leflunomide (ARAVA) 20 MG tablet Take 20 mg by mouth daily.    Historical Provider, MD  metoprolol (LOPRESSOR) 50 MG tablet take 1 tablet by mouth once daily 11/13/15   Dorothy Spark, MD  NEXIUM 40 MG capsule Take 1 capsule by mouth daily as needed. For acid reflux 03/12/14   Historical Provider, MD  nitroGLYCERIN (NITROSTAT) 0.4 MG SL tablet Place 1 tablet (0.4 mg total) under the tongue every 5 (five) minutes as needed for chest pain.  06/02/15   Carlena Bjornstad, MD  Omega-3 Fatty Acids (FISH OIL) 1000 MG CAPS Take by mouth daily.      Historical Provider, MD  predniSONE (DELTASONE) 5 MG tablet Take 10 mg by mouth daily with breakfast.     Historical Provider, MD  ramipril (ALTACE) 2.5 MG capsule Take 1 capsule (2.5 mg total) by mouth daily. 06/21/16   Dorothy Spark, MD  sennosides-docusate sodium (SENOKOT-S) 8.6-50 MG tablet Take 2 tablets by mouth daily. 11/16/13   Marchia Bond, MD  Tofacitinib Citrate (XELJANZ) 5 MG TABS Take 2 tablets by mouth.    Historical Provider, MD  traMADol (ULTRAM) 50 MG tablet Take 1 tablet (50 mg total) by mouth 4 (four) times daily. 06/27/14  Charlett Blake, MD  zolpidem (AMBIEN) 5 MG tablet Take 5 mg by mouth at bedtime. 12/13/14   Historical Provider, MD    Family History Family History  Problem Relation Age of Onset  . Heart attack Mother   . Heart disease Mother   . Diabetes Brother   . Heart disease Brother   . Diabetes Sister   . Arthritis/Rheumatoid Father   . Other Father     hepatic cirrhosis  . Heart disease Maternal Aunt   . Cancer Maternal Aunt     throat  . Heart attack Maternal Aunt   . Heart attack Maternal Uncle   . Heart disease Brother   . Cancer      family history  . Heart attack      family history  . Cancer Maternal Grandmother     colon  . Cancer Maternal Grandfather     throat    Social History Social History  Substance Use Topics  . Smoking status: Never Smoker  . Smokeless tobacco: Never Used  . Alcohol use No     Allergies   Codeine phosphate; Amoxicillin; Penicillins; and Lidocaine   Review of Systems Review of Systems  Musculoskeletal: Positive for arthralgias and myalgias.  Neurological: Negative for syncope.  All other systems reviewed and are negative.    Physical Exam Updated Vital Signs BP (!) 145/54 (BP Location: Left Arm)   Pulse 70   Temp 97.8 F (36.6 C) (Oral)   Resp 18   Ht 5\' 3"  (1.6 m)   Wt 220 lb (99.8  kg)   SpO2 98%   BMI 38.97 kg/m   Physical Exam  Constitutional: She is oriented to person, place, and time. She appears well-developed and well-nourished. No distress.  HENT:  Head: Normocephalic and atraumatic.  Right Ear: External ear normal.  Left Ear: External ear normal.  Nose: Nose normal.  Eyes: Conjunctivae and EOM are normal. Pupils are equal, round, and reactive to light.  Neck: Normal range of motion. Neck supple.  Pulmonary/Chest: Effort normal.  Musculoskeletal: Normal range of motion.  Contusion of left elbow and left wrist. TTP over bilateral ankles and left knee.  ttp right ankle and left anterior chest wall  Neurological: She is alert and oriented to person, place, and time. She exhibits normal muscle tone. Coordination normal.  Skin: Skin is warm and dry.  Psychiatric: She has a normal mood and affect. Her behavior is normal. Thought content normal.  Nursing note and vitals reviewed.    ED Treatments / Results  DIAGNOSTIC STUDIES:  Oxygen Saturation is 98% on RA, normal by my interpretation.    COORDINATION OF CARE:  1:12 PM Discussed treatment plan with pt at bedside including XRs of: left knee, bilateral ankles, left ribs/chest, left elbow and left wrist and pt agreed to plan.  Labs (all labs ordered are listed, but only abnormal results are displayed) Labs Reviewed - No data to display  EKG  EKG Interpretation None       Radiology Dg Ribs Unilateral W/chest Left  Result Date: 10/11/2016 CLINICAL DATA:  [place] 2 days ago with left-sided rib pain EXAM: LEFT RIBS AND CHEST - 3+ VIEW COMPARISON:  Chest x-Delicia Berens of 03/11/2016 FINDINGS: There is mild bibasilar linear atelectasis or scarring present. No pneumonia or effusion is seen. Mediastinal and hilar contours are unremarkable. The heart is borderline enlarged and stable. Left rib detail films show no acute left rib fracture. A lap band is noted. IMPRESSION: 1. Mild  bibasilar linear atelectasis or  scarring. 2. Negative left rib detail. Electronically Signed   By: Ivar Drape M.D.   On: 10/11/2016 11:01   Dg Elbow 2 Views Left  Result Date: 10/11/2016 CLINICAL DATA:  [place] 2 days ago with pain in the left elbow EXAM: LEFT ELBOW - 2 VIEW COMPARISON:  None. FINDINGS: The left elbow joint spaces appear normal and alignment is normal. No fracture is seen. No joint effusion is noted. IMPRESSION: Negative. Electronically Signed   By: Ivar Drape M.D.   On: 10/11/2016 11:01   Dg Wrist Complete Left  Result Date: 10/11/2016 CLINICAL DATA:  [place] 2 days ago with left wrist pain and swelling EXAM: LEFT WRIST - COMPLETE 3+ VIEW COMPARISON:  None. FINDINGS: The left radiocarpal joint space appears normal. No acute fracture is seen. Carpal bones are normal position with normal alignment present. A very minimal ulnar negative variance is present. IMPRESSION: No acute abnormality.  Minimal ulnar negative variance. Electronically Signed   By: Ivar Drape M.D.   On: 10/11/2016 11:03    Procedures Procedures (including critical care time)  Medications Ordered in ED Medications  oxyCODONE-acetaminophen (PERCOCET/ROXICET) 5-325 MG per tablet 1 tablet (not administered)     Initial Impression / Assessment and Plan / ED Course  I have reviewed the triage vital signs and the nursing notes.  Pertinent labs & imaging results that were available during my care of the patient were reviewed by me and considered in my medical decision making (see chart for details).   66 year old female who fell 2 days ago presents today with multiple contusions and muscle aches. She was tender to palpation bilateral lower extremities at the ankles and left lower extremity from the down to ankle. She has contusion of her left elbow and her left wrist. Radiographs obtained of all the above areas and no evidence of acute fracture seen. We discussed conservative management need for follow-up patient voices understanding    Final  Clinical Impressions(s) / ED Diagnoses   Final diagnoses:  Fall, initial encounter  Multiple contusions  Myalgia    New Prescriptions New Prescriptions   No medications on file   I personally performed the services described in this documentation, which was scribed in my presence. The recorded information has been reviewed and considered.    Pattricia Boss, MD 10/11/16 [phone]

## 2016-11-11 DIAGNOSIS — M0579 Rheumatoid arthritis with rheumatoid factor of multiple sites without organ or systems involvement: Secondary | ICD-10-CM | POA: Diagnosis not present

## 2016-11-11 DIAGNOSIS — M549 Dorsalgia, unspecified: Secondary | ICD-10-CM | POA: Diagnosis not present

## 2016-11-11 DIAGNOSIS — M797 Fibromyalgia: Secondary | ICD-10-CM | POA: Diagnosis not present

## 2016-11-11 DIAGNOSIS — M109 Gout, unspecified: Secondary | ICD-10-CM | POA: Diagnosis not present

## 2016-11-25 ENCOUNTER — Telehealth: Payer: Self-pay | Admitting: Gastroenterology

## 2016-11-25 NOTE — Telephone Encounter (Signed)
Dr. Silverio Decamp and [facility] Plan,   Do you approve of transfer?

## 2016-11-28 NOTE — Telephone Encounter (Signed)
OK 

## 2016-11-29 NOTE — Telephone Encounter (Signed)
No answer and voicemail not set up on either phone.

## 2016-11-29 NOTE — Telephone Encounter (Signed)
ok 

## 2016-12-01 DIAGNOSIS — R112 Nausea with vomiting, unspecified: Secondary | ICD-10-CM | POA: Diagnosis not present

## 2016-12-01 DIAGNOSIS — R079 Chest pain, unspecified: Secondary | ICD-10-CM | POA: Diagnosis not present

## 2016-12-01 DIAGNOSIS — R1084 Generalized abdominal pain: Secondary | ICD-10-CM | POA: Diagnosis not present

## 2016-12-02 ENCOUNTER — Inpatient Hospital Stay (HOSPITAL_COMMUNITY)
Admission: EM | Admit: 2016-12-02 | Discharge: 2016-12-04 | DRG: 369 | Disposition: A | Discharge status: Home / Self Care | Payer: Medicare Other | Attending: Internal Medicine | Admitting: Internal Medicine

## 2016-12-02 ENCOUNTER — Encounter (HOSPITAL_COMMUNITY): Payer: Self-pay

## 2016-12-02 ENCOUNTER — Emergency Department (HOSPITAL_COMMUNITY): Payer: Medicare Other

## 2016-12-02 ENCOUNTER — Observation Stay (HOSPITAL_COMMUNITY): Payer: Medicare Other

## 2016-12-02 DIAGNOSIS — I10 Essential (primary) hypertension: Secondary | ICD-10-CM | POA: Diagnosis not present

## 2016-12-02 DIAGNOSIS — N179 Acute kidney failure, unspecified: Secondary | ICD-10-CM | POA: Diagnosis present

## 2016-12-02 DIAGNOSIS — I2510 Atherosclerotic heart disease of native coronary artery without angina pectoris: Secondary | ICD-10-CM | POA: Diagnosis not present

## 2016-12-02 DIAGNOSIS — E872 Acidosis, unspecified: Secondary | ICD-10-CM

## 2016-12-02 DIAGNOSIS — R079 Chest pain, unspecified: Secondary | ICD-10-CM | POA: Diagnosis not present

## 2016-12-02 DIAGNOSIS — Z9884 Bariatric surgery status: Secondary | ICD-10-CM

## 2016-12-02 DIAGNOSIS — R0602 Shortness of breath: Secondary | ICD-10-CM | POA: Diagnosis not present

## 2016-12-02 DIAGNOSIS — Z79899 Other long term (current) drug therapy: Secondary | ICD-10-CM

## 2016-12-02 DIAGNOSIS — I493 Ventricular premature depolarization: Secondary | ICD-10-CM | POA: Diagnosis not present

## 2016-12-02 DIAGNOSIS — Z7982 Long term (current) use of aspirin: Secondary | ICD-10-CM

## 2016-12-02 DIAGNOSIS — E876 Hypokalemia: Secondary | ICD-10-CM | POA: Diagnosis not present

## 2016-12-02 DIAGNOSIS — N200 Calculus of kidney: Secondary | ICD-10-CM | POA: Diagnosis not present

## 2016-12-02 DIAGNOSIS — M069 Rheumatoid arthritis, unspecified: Secondary | ICD-10-CM | POA: Diagnosis present

## 2016-12-02 DIAGNOSIS — R1110 Vomiting, unspecified: Secondary | ICD-10-CM | POA: Diagnosis not present

## 2016-12-02 DIAGNOSIS — I110 Hypertensive heart disease with heart failure: Secondary | ICD-10-CM | POA: Diagnosis present

## 2016-12-02 DIAGNOSIS — D649 Anemia, unspecified: Secondary | ICD-10-CM | POA: Diagnosis present

## 2016-12-02 DIAGNOSIS — I5032 Chronic diastolic (congestive) heart failure: Secondary | ICD-10-CM | POA: Diagnosis present

## 2016-12-02 DIAGNOSIS — Z880 Allergy status to penicillin: Secondary | ICD-10-CM

## 2016-12-02 DIAGNOSIS — E860 Dehydration: Secondary | ICD-10-CM | POA: Diagnosis present

## 2016-12-02 DIAGNOSIS — B3781 Candidal esophagitis: Secondary | ICD-10-CM | POA: Diagnosis not present

## 2016-12-02 LAB — CBC
HEMATOCRIT: 38.6 % (ref 36.0–46.0)
HEMOGLOBIN: 12.8 g/dL (ref 12.0–15.0)
MCH: 29.4 pg (ref 26.0–34.0)
MCHC: 33.2 g/dL (ref 30.0–36.0)
MCV: 88.7 fL (ref 78.0–100.0)
Platelets: 336 10*3/uL (ref 150–400)
RBC: 4.35 MIL/uL (ref 3.87–5.11)
RDW: 14.9 % (ref 11.5–15.5)
WBC: 9.2 10*3/uL (ref 4.0–10.5)

## 2016-12-02 LAB — I-STAT CHEM 8, ED
BUN: 6 mg/dL (ref 6–20)
Calcium, Ion: 0.86 mmol/L — CL (ref 1.15–1.40)
Chloride: 95 mmol/L — ABNORMAL LOW (ref 101–111)
Creatinine, Ser: 0.8 mg/dL (ref 0.44–1.00)
Glucose, Bld: 137 mg/dL — ABNORMAL HIGH (ref 65–99)
HEMATOCRIT: 30 % — AB (ref 36.0–46.0)
HEMOGLOBIN: 10.2 g/dL — AB (ref 12.0–15.0)
POTASSIUM: 3 mmol/L — AB (ref 3.5–5.1)
SODIUM: 136 mmol/L (ref 135–145)
TCO2: 26 mmol/L (ref 0–100)

## 2016-12-02 LAB — URINALYSIS, ROUTINE W REFLEX MICROSCOPIC
BILIRUBIN URINE: NEGATIVE
Glucose, UA: NEGATIVE mg/dL
Hgb urine dipstick: NEGATIVE
Ketones, ur: NEGATIVE mg/dL
LEUKOCYTES UA: NEGATIVE
NITRITE: NEGATIVE
PH: 5 (ref 5.0–8.0)
Protein, ur: NEGATIVE mg/dL
SPECIFIC GRAVITY, URINE: 1.005 (ref 1.005–1.030)

## 2016-12-02 LAB — COMPREHENSIVE METABOLIC PANEL
ALK PHOS: 89 U/L (ref 38–126)
ALT: 35 U/L (ref 14–54)
ANION GAP: 15 (ref 5–15)
AST: 29 U/L (ref 15–41)
Albumin: 3.3 g/dL — ABNORMAL LOW (ref 3.5–5.0)
BILIRUBIN TOTAL: 0.4 mg/dL (ref 0.3–1.2)
BUN: 7 mg/dL (ref 6–20)
CALCIUM: 7.8 mg/dL — AB (ref 8.9–10.3)
CO2: 26 mmol/L (ref 22–32)
Chloride: 93 mmol/L — ABNORMAL LOW (ref 101–111)
Creatinine, Ser: 1 mg/dL (ref 0.44–1.00)
GFR, EST NON AFRICAN AMERICAN: 58 mL/min — AB (ref >60)
GLUCOSE: 157 mg/dL — AB (ref 65–99)
POTASSIUM: 2.3 mmol/L — AB (ref 3.5–5.1)
Sodium: 134 mmol/L — ABNORMAL LOW (ref 135–145)
TOTAL PROTEIN: 6.2 g/dL — AB (ref 6.5–8.1)

## 2016-12-02 LAB — I-STAT TROPONIN, ED
TROPONIN I, POC: 0.01 ng/mL (ref 0.00–0.08)
TROPONIN I, POC: 0.01 ng/mL (ref 0.00–0.08)

## 2016-12-02 LAB — I-STAT CG4 LACTIC ACID, ED
Lactic Acid, Venous: 3.21 mmol/L (ref 0.5–1.9)
Lactic Acid, Venous: 3.99 mmol/L (ref 0.5–1.9)

## 2016-12-02 LAB — LIPASE, BLOOD: Lipase: 31 U/L (ref 11–51)

## 2016-12-02 MED ORDER — ONDANSETRON HCL 4 MG/2ML IJ SOLN
4.0000 mg | Freq: Once | INTRAMUSCULAR | Status: AC
  Administered 2016-12-02: 4 mg via INTRAVENOUS
  Filled 2016-12-02: qty 2

## 2016-12-02 MED ORDER — POTASSIUM CHLORIDE IN NACL 40-0.9 MEQ/L-% IV SOLN
INTRAVENOUS | Status: DC
Start: 2016-12-02 — End: 2016-12-03
  Administered 2016-12-03: 100 mL/h via INTRAVENOUS
  Filled 2016-12-02 (×2): qty 1000

## 2016-12-02 MED ORDER — IOPAMIDOL (ISOVUE-370) INJECTION 76%
INTRAVENOUS | Status: AC
  Administered 2016-12-02: 100 mL
  Filled 2016-12-02: qty 100

## 2016-12-02 MED ORDER — ONDANSETRON HCL 4 MG PO TABS: 4.0000 mg | ORAL_TABLET | Freq: Four times a day (QID) | ORAL | Status: DC | PRN

## 2016-12-02 MED ORDER — IOPAMIDOL (ISOVUE-300) INJECTION 61%
INTRAVENOUS | Status: DC
Start: 2016-12-02 — End: 2016-12-02
  Filled 2016-12-02: qty 100

## 2016-12-02 MED ORDER — PROMETHAZINE HCL 25 MG/ML IJ SOLN
12.5000 mg | Freq: Once | INTRAMUSCULAR | Status: AC
  Administered 2016-12-02: 12.5 mg via INTRAVENOUS
  Filled 2016-12-02: qty 1

## 2016-12-02 MED ORDER — ACETAMINOPHEN 650 MG RE SUPP: 650.0000 mg | Freq: Four times a day (QID) | RECTAL | Status: DC | PRN

## 2016-12-02 MED ORDER — SODIUM CHLORIDE 0.9 % IV BOLUS (SEPSIS)
1000.0000 mL | Freq: Once | INTRAVENOUS | Status: AC
  Administered 2016-12-02: 1000 mL via INTRAVENOUS

## 2016-12-02 MED ORDER — MAGNESIUM SULFATE 2 GM/50ML IV SOLN
2.0000 g | Freq: Once | INTRAVENOUS | Status: AC
  Administered 2016-12-02: 2 g via INTRAVENOUS
  Filled 2016-12-02: qty 50

## 2016-12-02 MED ORDER — POTASSIUM CHLORIDE 10 MEQ/100ML IV SOLN
10.0000 meq | INTRAVENOUS | Status: AC
  Administered 2016-12-02 (×3): 10 meq via INTRAVENOUS
  Filled 2016-12-02 (×3): qty 100

## 2016-12-02 MED ORDER — ACETAMINOPHEN 325 MG PO TABS
650.0000 mg | ORAL_TABLET | Freq: Four times a day (QID) | ORAL | Status: DC | PRN
  Administered 2016-12-03: 650 mg via ORAL
  Filled 2016-12-02: qty 2

## 2016-12-02 MED ORDER — POTASSIUM CHLORIDE 20 MEQ/15ML (10%) PO SOLN
40.0000 meq | Freq: Once | ORAL | Status: AC
  Administered 2016-12-02: 40 meq via ORAL
  Filled 2016-12-02: qty 30

## 2016-12-02 MED ORDER — HYDROMORPHONE HCL 1 MG/ML IJ SOLN
0.5000 mg | Freq: Once | INTRAMUSCULAR | Status: AC
  Administered 2016-12-02: 0.5 mg via INTRAVENOUS
  Filled 2016-12-02: qty 1

## 2016-12-02 MED ORDER — ONDANSETRON HCL 4 MG/2ML IJ SOLN: 4.0000 mg | Freq: Four times a day (QID) | INTRAMUSCULAR | Status: DC | PRN

## 2016-12-02 MED ORDER — SODIUM CHLORIDE 0.9 % IV SOLN: 1000.0000 mL | INTRAVENOUS | Status: DC

## 2016-12-02 NOTE — ED Provider Notes (Signed)
[facility] DEPT Provider Note   CSN: [id number] Arrival date & time: 12/02/16  1613     History   Chief Complaint Chief Complaint  Patient presents with  . Abdominal Pain    HPI Cindy Holmes is a 66 y.o. female who presents the emergency department with a chief complaint of epigastric abdominal pain and fullness. She has a past medical history of coronary artery disease status post stent placement and coronary artery bypass graft. She is also status post lap band surgery about 8 years ago. The patient complains of 3 weeks of progressively worsening epigastric abdominal pain. She states that she has a sense of fullness in her abdomen "like nothings moving through." The patient even made an appointment with a gastroenterologist to set her up for endoscopy. However, this is a week away from now when she came in today because her pain is too great. She states that she has noticed distention and swelling in the epigastric region. She has been able to keep it has been only taking in liquids, like soup. She has had some retching but denies vomiting. She denies constipation and she states she has been taking laxatives to have been "moving things along." She denies chest pain, shortness of breath or diaphoresis.  HPI  Past Medical History:  Diagnosis Date  . CAD (coronary artery disease)    a. PTCA LAD 2001/cath 2002-prox and mid LAD stents patent, PTCA ostium Dx 1 b. cath 03/2016: patent LAD stent with less than 40% in-stent restenosis, 10-30% stenosis of D1 and distal LAD with continued medical management recommended.  . Carotid artery disease ([place])    Doppler September, 2011, 0-39% bilateral disease mild  . Edema   . Emotional stress reaction    8/11  . HTN (hypertension)   . Hyperlipidemia   . Leg pain    July, 2012, Arterial Dopplers March 08, 2011 normal  . 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 Overweight(278.02)    laproscopic adjustable gastric  banding with APS standard system  . PONV (postoperative nausea and vomiting)   . Rheumatoid arthritis ([place])   . Right rotator cuff tear 11/16/2013  . Stroke [facility])    mini stroke in past ???    Patient Active Problem List   Diagnosis Date Noted  . Coronary artery disease involving native coronary artery of native heart without angina pectoris 11/13/2015  . Chest pain 11/13/2015  . Hyperlipemia 11/13/2015  . Acute on chronic diastolic CHF (congestive heart failure), NYHA class 3 ([place]) 11/13/2015  . Pain in joint, shoulder region 01/16/2014  . Muscle weakness (generalized) 01/16/2014  . Decreased range of motion of right shoulder 01/16/2014  . Right rotator cuff tear 11/16/2013  . Rotator cuff tear 11/16/2013  . Rheumatoid arthritis ([place])   . CAD (coronary artery disease)   . HTN (hypertension)   . Hyperlipidemia   . Stroke ([place])   . Emotional stress reaction   . Leg pain   . Carotid artery disease ([place])   . OVERWEIGHT/OBESITY 11/04/2008  . EDEMA 11/04/2008    Past Surgical History:  Procedure Laterality Date  . CARDIAC CATHETERIZATION     with stent placement in 2001  . CARDIAC CATHETERIZATION N/A 03/12/2016   Procedure: Left Heart Cath and Coronary Angiography;  Surgeon: Nelva Bush, MD;  Location: [place] CV LAB;  Service: Cardiovascular;  Laterality: N/A;  . CARDIAC CATHETERIZATION N/A 03/12/2016   Procedure: Intravascular Pressure Wire/FFR Study;  Surgeon: Nelva Bush,  MD;  Location: [place] CV LAB;  Service: Cardiovascular;  Laterality: N/A;  . CHOLECYSTECTOMY    . CORONARY ANGIOPLASTY     2002  . laproscopic adjustable gastric  banding     with APS standard system.   . Pt has 3 stents     sept 2001  . SHOULDER ARTHROSCOPY WITH SUBACROMIAL DECOMPRESSION, ROTATOR CUFF REPAIR AND BICEP TENDON REPAIR Right 11/16/2013   Procedure: RIGHT SHOULDER ARTHROSCOPY, EXTENSIVE DEBRIDEMENT, ROTATOR CUFF REPAIR ;  Surgeon: [name], MD;  Location: [facility];  Service: Orthopedics;  Laterality: Right;  . TUBAL LIGATION      OB History    No data available       Home Medications    Prior to Admission medications   Medication Sig Start Date End Date Taking? Authorizing Provider  allopurinol (ZYLOPRIM) 300 MG tablet Take 300 mg by mouth daily. 01/07/15   [provider]  aspirin 81 MG tablet Take 81 mg by mouth daily.      [provider]  atorvastatin (LIPITOR) 40 MG tablet Take 2 tablets (80 mg total) by mouth daily. 09/08/16 12/07/16  Dorothy Spark, MD  clopidogrel (PLAVIX) 75 MG tablet TAKE 1 TABLET BY MOUTH  EVERY DAY 08/09/16   Dorothy Spark, MD  desloratadine (CLARINEX) 5 MG tablet Take 5 mg by mouth daily.    [provider]  diazepam (VALIUM) 5 MG tablet Take 1 tablet by mouth daily. 02/25/14   [provider]  ezetimibe (ZETIA) 10 MG tablet Take 1 tablet (10 mg total) by mouth daily. 07/30/16 10/28/16  Dorothy Spark, MD  folic acid (FOLVITE) 1 MG tablet Take 1 mg by mouth daily. Takes with chemo medication. 03/05/14   [provider]  furosemide (LASIX) 40 MG tablet Take 1 tablet (40 mg total) by mouth daily. Take extra 40 mg for weight gain 3 # x 1 day 07/15/16 10/13/16  Lyda Jester M, PA-C  gabapentin (NEURONTIN) 300 MG capsule Take 1 capsule (300 mg total) by mouth 3 (three) times daily. 06/27/14   Kirsteins, Luanna Salk, MD  leflunomide (ARAVA) 20 MG tablet Take 20 mg by mouth daily.    [provider]  metoprolol (LOPRESSOR) 50 MG tablet take 1 tablet by mouth once daily 11/13/15   Dorothy Spark, MD  NEXIUM 40 MG capsule Take 1 capsule by mouth daily as needed. For acid reflux 03/12/14   [provider]  nitroGLYCERIN (NITROSTAT) 0.4 MG SL tablet Place 1 tablet (0.4 mg total) under the tongue every 5 (five) minutes as needed for chest pain. 06/02/15   Carlena Bjornstad, MD  Omega-3 Fatty Acids (FISH OIL) 1000 MG CAPS Take by mouth daily.      [provider]  predniSONE (DELTASONE) 5 MG tablet Take 10 mg by mouth daily with breakfast.     [provider]  ramipril (ALTACE) 2.5 MG capsule Take 1 capsule (2.5 mg total) by mouth daily. 06/21/16   Dorothy Spark, MD  sennosides-docusate sodium (SENOKOT-S) 8.6-50 MG tablet Take 2 tablets by mouth daily. 11/16/13   Marchia Bond, MD  Tofacitinib Citrate (XELJANZ) 5 MG TABS Take 2 tablets by mouth.    [provider]  traMADol (ULTRAM) 50 MG tablet Take 1 tablet (50 mg total) by mouth 4 (four) times daily. 06/27/14   Kirsteins, Luanna Salk, MD  zolpidem (AMBIEN) 5 MG tablet Take 5 mg by mouth at bedtime. 12/13/14  [provider]    Family History Family History  Problem Relation Age of Onset  . Heart attack Mother   . Heart disease Mother   . Diabetes Brother   . Heart disease Brother   . Diabetes Sister   . Arthritis/Rheumatoid Father   . Other Father        hepatic cirrhosis  . Heart disease Maternal Aunt   . Cancer Maternal Aunt        throat  . Heart attack Maternal Aunt   . Heart attack Maternal Uncle   . Heart disease Brother   . Cancer Unknown        family history  . Heart attack Unknown        family history  . Cancer Maternal Grandmother        colon  . Cancer Maternal Grandfather        throat    Social History Social History  Substance Use Topics  . Smoking status: Never Smoker  . Smokeless tobacco: Never Used  . Alcohol use No     Allergies   Codeine phosphate; Amoxicillin; Penicillins; and Lidocaine   Review of Systems Review of Systems  Ten systems reviewed and are negative for acute change, except as noted in the HPI.   Physical Exam Updated Vital Signs BP (!) 147/102 (BP Location: Left Arm)   Pulse 98   Temp 98.4 F (36.9 C) (Oral)   Resp (!) 22   Ht 5\' 2"  (1.575 m)   Wt 99.8 kg (220 lb)   SpO2 96%   BMI 40.24 kg/m   Physical Exam  Constitutional: She is oriented to person, place, and time. She appears  well-developed and well-nourished. No distress.  Patient appears uncomfortable  HENT:  Head: Normocephalic and atraumatic.  Eyes: Conjunctivae are normal. No scleral icterus.  Neck: Normal range of motion.  Cardiovascular: Normal rate, regular rhythm and normal heart sounds.  Exam reveals no gallop and no friction rub.   No murmur heard. Pulmonary/Chest: Effort normal and breath sounds normal. No respiratory distress.  Abdominal: Soft. Bowel sounds are normal. She exhibits distension. She exhibits no mass. There is tenderness. There is no guarding.  Patient with tenderness and distention in the epigastric region. She is palpable mass just under this size would process which may be her LAP-BAND. She is exquisitely tender to palpation.  Neurological: She is alert and oriented to person, place, and time.  Skin: Skin is warm and dry. She is not diaphoretic.  Psychiatric: Her behavior is normal.  Nursing note and vitals reviewed.    ED Treatments / Results  Labs (all labs ordered are listed, but only abnormal results are displayed) Labs Reviewed  COMPREHENSIVE METABOLIC PANEL - Abnormal; Notable for the following:       Result Value   Sodium 134 (*)    Potassium 2.3 (*)    Chloride 93 (*)    Glucose, Bld 157 (*)    Calcium 7.8 (*)    Total Protein 6.2 (*)    Albumin 3.3 (*)    GFR calc non Af Amer 58 (*)    All other components within normal limits  URINALYSIS, ROUTINE W REFLEX MICROSCOPIC - Abnormal; Notable for the following:    Color, Urine STRAW (*)    All other components within normal limits  LIPASE, BLOOD  CBC  I-STAT TROPOININ, ED  I-STAT CG4 LACTIC ACID, ED    EKG  EKG Interpretation None  Radiology No results found.  Procedures Procedures (including critical care time)  Medications Ordered in ED Medications  potassium chloride 20 MEQ/15ML (10%) solution 40 mEq (not administered)  magnesium sulfate IVPB 2 g 50 mL (not administered)  potassium  chloride 10 mEq in 100 mL IVPB (not administered)  HYDROmorphone (DILAUDID) injection 0.5 mg (0.5 mg Intravenous Given 12/02/16 1836)  ondansetron (ZOFRAN) injection 4 mg (4 mg Intravenous Given 12/02/16 1836)     Initial Impression / Assessment and Plan / ED Course  I have reviewed the triage vital signs and the nursing notes.  Pertinent labs & imaging results that were available during my care of the patient were reviewed by me and considered in my medical decision making (see chart for details).  Clinical Course as of Dec 03 98  Thu Dec 02, 2016  1813   Potassium: (!!) 2.3 [AH]  1949 Lactic Acid, Venous: (!!) 3.21 [AH]    Clinical Course User Index [AH] Margarita Mail, PA-C    Patient with epigastric Pain, abdominal bloating. Her lap band appears to be placed properly. No evidence of acute intra-abdominal abnormality. The patient is hypokalemic with EKG changes, persistent retching and abdominal pain. Unable to tolerate by mouth's at this time. Patient will be admitted to continue replete her potassium. She has a lactic acidosis of unknown etiology, but I do not think that her elevated lactic acid, represents sepsis at this point as she is afebrile, not tachycardic or hypotensive and does not has an elevated white blood cell count. The patient will be admitted by Dr. Loleta Books. She is stable through her visit.  Final Clinical Impressions(s) / ED Diagnoses   Final diagnoses:  None    New Prescriptions New Prescriptions   No medications on file     Margarita Mail, PA-C 12/03/16 0103    Tegeler, Gwenyth Allegra, MD 12/08/16 1320

## 2016-12-02 NOTE — ED Triage Notes (Signed)
Pt states she has been feeling like "food wont go down" x 3 weeks. PT has hx of lap band 8 years ago.  Nausea but no vomiting

## 2016-12-02 NOTE — H&P (Addendum)
History and Physical  Patient Name: Cindy Holmes     CNO:[id number]    DOB: [birth year]    DOA: 12/02/2016 PCP: Redmond School, MD   Patient coming from: Home  Chief Complaint: Three weeks epigastric pain  HPI: Cindy Holmes is a 66 y.o. female with a past medical history significant for CAD s/p PCI 2001, RA on prednisone, leflunomide, Xeljanz, HFpEF, hx of Lap band 8 years ago and remote stroke who presents with epigastric pain for 3 weeks.  The patient first developed epigastric pain, aching in character, worse with eating or sitting forward, non-exertional about three weeks ago.  This has been slowly progressive over the last few weeks to the point that now she can only eat liquids (solids make it worse).  Now she is starting to get weak, severe fatigue, dizziness with exertion and persistent nausea.  She made a referral to GI, and plans to have an EGD with Dr. Benson Norway next Thursday (last dose Plavix was Weds/yesterday), but tonight she couldn't stand waiting on the pain anymore so she came to the ER.  She has had no fever, chills, cough, sputum production, dyspena, dysuria, urinary frequency/urgency.    ED course: -Afebrile, heart rate 98, respirations and pulse ox normal, BP 147/102 -Na 134, K 2.3, Cr 1.0 (baseline 0.6), WBC 9.2K, Hgb 12.8 -LFTs normal -Troponin negative -Lipase normal -UA without pyuria, bacteria -Lactic acid 3.2 -CT angiogram of abdomen and pelvis was obtained to rule out mesenteric ischemia given lactic acidosis and this showed left renal artery disease only, but no mesenteric vascular disease and no findings of pancreatitis, SBO, hiatal hernia, or abnormalities of her lap band -The case was discussed with Gen Surg by phone, who suspected her pain was not from lap band, nor ischemic bowel given normal CT imaging -TRH were asked to evaluate for hypokalemia and elevated lactic acid   She has no liver disease or asthma.  Her breathing is normal, and oxygenation  normal throughout time in ER.  Her hemodynamics are normal.  She has had no fever reported or measured.  She does not take metformin.         ROS: Review of Systems  Constitutional: Positive for malaise/fatigue. Negative for chills and fever.  Respiratory: Negative for cough, sputum production and shortness of breath.   Cardiovascular: Negative for chest pain, palpitations, orthopnea, leg swelling and PND.  Gastrointestinal: Positive for abdominal pain and nausea. Negative for blood in stool and melena.  Genitourinary: Negative for dysuria, frequency, hematuria and urgency.  Neurological: Positive for dizziness and weakness.  All other systems reviewed and are negative.         Past Medical History:  Diagnosis Date  . CAD (coronary artery disease)    a. PTCA LAD 2001/cath 2002-prox and mid LAD stents patent, PTCA ostium Dx 1 b. cath 03/2016: patent LAD stent with less than 40% in-stent restenosis, 10-30% stenosis of D1 and distal LAD with continued medical management recommended.  . Carotid artery disease ([place])    Doppler September, 2011, 0-39% bilateral disease mild  . Edema   . Emotional stress reaction    8/11  . HTN (hypertension)   . Hyperlipidemia   . Leg pain    July, 2012, Arterial Dopplers March 08, 2011 normal  . 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 PONV (postoperative nausea and vomiting)   . Rheumatoid  arthritis ([place])   . Right rotator cuff tear 11/16/2013  . Stroke [facility])    mini stroke in past ???    Past Surgical History:  Procedure Laterality Date  . CARDIAC CATHETERIZATION     with stent placement in 2001  . CARDIAC CATHETERIZATION N/A 03/12/2016   Procedure: Left Heart Cath and Coronary Angiography;  Surgeon: Nelva Bush, MD;  Location: [facility];  Service: Cardiovascular;  Laterality: N/A;  . CARDIAC CATHETERIZATION N/A  03/12/2016   Procedure: Intravascular Pressure Wire/FFR Study;  Surgeon: Nelva Bush, MD;  Location: [facility];  Service: Cardiovascular;  Laterality: N/A;  . CHOLECYSTECTOMY    . CORONARY ANGIOPLASTY     2002  . laproscopic adjustable gastric  banding     with APS standard system.   . Pt has 3 stents     sept 2001  . SHOULDER ARTHROSCOPY WITH SUBACROMIAL DECOMPRESSION, ROTATOR CUFF REPAIR AND BICEP TENDON REPAIR Right 11/16/2013   Procedure: RIGHT SHOULDER ARTHROSCOPY, EXTENSIVE DEBRIDEMENT, ROTATOR CUFF REPAIR ;  Surgeon: [name], MD;  Location: [facility];  Service: Orthopedics;  Laterality: Right;  . TUBAL LIGATION      Social History: Patient lives with her husband.  The patient walks with a cane or walker sometimes.  Nonsmoker.  No alcohol.  From [place].  On disability for her RA.  Allergies  Allergen Reactions  . Codeine Phosphate Shortness Of Breath    REACTION: unspecified  . Amoxicillin Nausea And Vomiting    REACTION: unspecified  . Penicillins Nausea And Vomiting    Has patient had a PCN reaction causing immediate rash, facial/tongue/throat swelling, SOB or lightheadedness with hypotension:YES Has patient had a PCN reaction causing severe rash involving mucus membranes or skin necrosis: NO Has patient had a PCN reaction that required hospitalization NO Has patient had a PCN reaction occurring within the last 10 years: NO If all of the above answers are "NO", then may proceed with Cephalosporin use.  . Lidocaine Rash    Patch caused rash    Family history: family history includes Arthritis/Rheumatoid in her father; Cancer in her maternal aunt, maternal grandfather, and maternal grandmother; Diabetes in her brother and sister; Heart attack in her maternal aunt, maternal uncle, and mother; Heart disease in her brother, brother, maternal aunt, and mother; Other in her father.  Prior to Admission medications   Medication Sig Start Date End  Date Taking? Authorizing Provider  allopurinol (ZYLOPRIM) 300 MG tablet Take 300 mg by mouth daily. 01/07/15   [provider]  aspirin 81 MG tablet Take 81 mg by mouth daily.      [provider]  atorvastatin (LIPITOR) 40 MG tablet Take 2 tablets (80 mg total) by mouth daily. 09/08/16 12/07/16  Dorothy Spark, MD  clopidogrel (PLAVIX) 75 MG tablet TAKE 1 TABLET BY MOUTH  EVERY DAY 08/09/16   Dorothy Spark, MD  desloratadine (CLARINEX) 5 MG tablet Take 5 mg by mouth daily.    [provider]  diazepam (VALIUM) 5 MG tablet Take 1 tablet by mouth daily. 02/25/14   [provider]  ezetimibe (ZETIA) 10 MG tablet Take 1 tablet (10 mg total) by mouth daily. 07/30/16 10/28/16  Dorothy Spark, MD  folic acid (FOLVITE) 1 MG tablet Take 1 mg by mouth daily. Takes with chemo medication. 03/05/14   [provider]  furosemide (LASIX) 40 MG tablet Take 1 tablet (40 mg total) by mouth daily. Take extra 40  mg for weight gain 3 # x 1 day 07/15/16 10/13/16  Lyda Jester M, PA-C  gabapentin (NEURONTIN) 300 MG capsule Take 1 capsule (300 mg total) by mouth 3 (three) times daily. 06/27/14   Kirsteins, Luanna Salk, MD  leflunomide (ARAVA) 20 MG tablet Take 20 mg by mouth daily.    [provider]  metoprolol (LOPRESSOR) 50 MG tablet take 1 tablet by mouth once daily 11/13/15   Dorothy Spark, MD  NEXIUM 40 MG capsule Take 1 capsule by mouth daily as needed. For acid reflux 03/12/14   [provider]  nitroGLYCERIN (NITROSTAT) 0.4 MG SL tablet Place 1 tablet (0.4 mg total) under the tongue every 5 (five) minutes as needed for chest pain. 06/02/15   Carlena Bjornstad, MD  Omega-3 Fatty Acids (FISH OIL) 1000 MG CAPS Take by mouth daily.      [provider]  predniSONE (DELTASONE) 5 MG tablet Take 10 mg by mouth daily with breakfast.     [provider]  ramipril (ALTACE) 2.5 MG capsule Take 1 capsule (2.5 mg total) by mouth daily.  06/21/16   Dorothy Spark, MD  sennosides-docusate sodium (SENOKOT-S) 8.6-50 MG tablet Take 2 tablets by mouth daily. 11/16/13   Marchia Bond, MD  Tofacitinib Citrate (XELJANZ) 5 MG TABS Take 2 tablets by mouth.    [provider]  traMADol (ULTRAM) 50 MG tablet Take 1 tablet (50 mg total) by mouth 4 (four) times daily. 06/27/14   Kirsteins, Luanna Salk, MD  zolpidem (AMBIEN) 5 MG tablet Take 5 mg by mouth at bedtime. 12/13/14   [provider]       Physical Exam: BP 120/67   Pulse 87   Temp 98.4 F (36.9 C) (Oral)   Resp 16   Ht 5\' 2"  (1.575 m)   Wt 99.8 kg (220 lb)   SpO2 100%   BMI 40.24 kg/m  General appearance: Obese adult female, alert and in mild distress from malaise, fatigue, frustration.   Eyes: Anicteric, conjunctiva pink, lids and lashes normal. PERRL.    ENT: No nasal deformity, discharge, epistaxis.  Hearing normal. OP moist without lesions.   Neck: No neck masses.  Trachea midline.  No thyromegaly/tenderness. Lymph: No cervical or supraclavicular lymphadenopathy. Skin: Warm and dry.  No jaundice.  No suspicious rashes or lesions. Cardiac: RRR, nl S1-S2, no murmurs appreciated.  Capillary refill is brisk.  JVP not visible.  Bilateral nonpitting LE edema.  Radial pulses 2+ and symmetric. Respiratory: Normal respiratory rate and rhythm.  CTAB without rales or wheezes. Abdomen: Abdomen soft. Mild mid epigastric TTP, no rebound or guarding. No ascites, distension, hepatosplenomegaly.   MSK: No deformities or effusions.  No cyanosis or clubbing. Neuro: Cranial nerves 3-12 intact.  Sensation intact to light touch. Speech is fluent.  Muscle strength 5/5 and symmetric.    Psych: Sensorium intact and responding to questions, attention normal.  Behavior appropriate.  Affect blunted.  Judgment and insight appear normal.     Labs on Admission:  I have personally reviewed following labs and imaging studies: CBC:  Recent Labs Lab 12/02/16 1643  12/02/16 2323  WBC 9.2  --   HGB 12.8 10.2*  HCT 38.6 30.0*  MCV 88.7  --   PLT 336  --    Basic Metabolic Panel:  Recent Labs Lab 12/02/16 1643 12/02/16 2323  NA 134* 136  K 2.3* 3.0*  CL 93* 95*  CO2 26  --   GLUCOSE 157* 137*  BUN 7 6  CREATININE 1.00 0.80  CALCIUM 7.8*  --    GFR: Estimated Creatinine Clearance: 77.5 mL/min (by C-G formula based on SCr of 0.8 mg/dL).  Liver Function Tests:  Recent Labs Lab 12/02/16 1643  AST 29  ALT 35  ALKPHOS 89  BILITOT 0.4  PROT 6.2*  ALBUMIN 3.3*    Recent Labs Lab 12/02/16 1643  LIPASE 31   No results for input(s): AMMONIA in the last 168 hours. Coagulation Profile: No results for input(s): INR, PROTIME in the last 168 hours. Cardiac Enzymes: No results for input(s): CKTOTAL, CKMB, CKMBINDEX, TROPONINI in the last 168 hours. BNP (last 3 results) No results for input(s): PROBNP in the last 8760 hours. HbA1C: No results for input(s): HGBA1C in the last 72 hours. CBG: No results for input(s): GLUCAP in the last 168 hours. Lipid Profile: No results for input(s): CHOL, HDL, LDLCALC, TRIG, CHOLHDL, LDLDIRECT in the last 72 hours. Thyroid Function Tests: No results for input(s): TSH, T4TOTAL, FREET4, T3FREE, THYROIDAB in the last 72 hours. Anemia Panel: No results for input(s): VITAMINB12, FOLATE, FERRITIN, TIBC, IRON, RETICCTPCT in the last 72 hours. Sepsis Labs: Lactic acid 3.2 Invalid input(s): PROCALCITONIN, LACTICIDVEN No results found for this or any previous visit (from the past 240 hour(s)).       Radiological Exams on Admission: Personally reviewed CTA report: Ct Angio Abd/pel W And/or Wo Contrast  Result Date: 12/02/2016 CLINICAL DATA:  Acute onset of generalized abdominal pain, nausea and vomiting. Elevated lactic acid. Initial encounter. EXAM: CTA ABDOMEN AND PELVIS wITHOUT AND WITH CONTRAST TECHNIQUE: Multidetector CT imaging of the abdomen and pelvis was performed using the standard protocol  during bolus administration of intravenous contrast. Multiplanar reconstructed images and MIPs were obtained and reviewed to evaluate the vascular anatomy. CONTRAST:  100 mL of Isovue 370 IV contrast COMPARISON:  MRI of the lumbar spine performed 03/29/2015 FINDINGS: VASCULAR Aorta: Scattered calcification is seen along the abdominal aorta and branches. No significant luminal narrowing is seen. There is no evidence of aortic dissection. There is no evidence of aneurysmal dilatation. Celiac: The celiac trunk appears intact, with mild calcification at the origin of the celiac trunk. SMA: The superior mesenteric artery remains patent, with scattered calcification at the origin of the superior mesenteric artery. Renals: There is likely moderate to severe luminal narrowing at the origin of the left renal artery. Would correlate clinically for refractory hypertension. Scattered calcification is seen at the origins of both renal arteries. IMA: The inferior mesenteric artery remains grossly patent. Mild calcification is seen at the origin of the inferior mesenteric artery. Inflow: Mild calcification is seen along the internal iliac arteries bilaterally. The common and external iliac arteries, and common femoral arteries, are grossly unremarkable in appearance. Proximal Outflow: The proximal branches of the common femoral arteries appear grossly intact bilaterally. Veins: Visualized venous structures are grossly unremarkable. The inferior vena cava is unremarkable in appearance. Review of the MIP images confirms the above findings. NON-VASCULAR Lower chest: Scattered coronary artery calcifications are seen. Right basilar scarring is noted. The patient's gastric banding is grossly unremarkable in appearance. Hepatobiliary: The liver is unremarkable in appearance. The patient is status post cholecystectomy, with clips noted at the gallbladder fossa. The common bile duct remains normal in caliber. Pancreas: The pancreas is  within normal limits. Spleen: The spleen is unremarkable in appearance. Adrenals/Urinary Tract: The adrenal glands are unremarkable in appearance. A small right renal cyst is noted. Nonspecific perinephric stranding is noted bilaterally. There is no  evidence of hydronephrosis. No renal or ureteral stones are identified. Stomach/Bowel: The stomach is unremarkable in appearance. The small bowel is within normal limits. The appendix is not visualized; there is no evidence for appendicitis. Scattered diverticulosis is noted along the proximal sigmoid colon, without evidence of diverticulitis. Lymphatic: No retroperitoneal or pelvic sidewall lymphadenopathy is seen. Reproductive: The bladder is mildly distended and grossly remarkable. The uterus is unremarkable in appearance. The ovaries are grossly symmetric. No suspicious adnexal masses are seen. Other: No additional soft tissue abnormalities are seen. Musculoskeletal: No acute osseous abnormalities are identified. Facet disease is noted at the lower lumbar spine. The visualized musculature is unremarkable in appearance. IMPRESSION: VASCULAR 1. Scattered aortic atherosclerosis. 2. Likely moderate to severe luminal narrowing at the origin of the left renal artery. Would correlate clinically for refractory hypertension, and consider treatment as deemed clinically appropriate. 3. Scattered coronary artery calcifications seen. NON-VASCULAR 1. Right basilar scarring noted. Visualized lung bases otherwise clear. 2. Small right renal cyst. 3. Scattered diverticulosis along the proximal sigmoid colon, without evidence of diverticulitis. 4. Gastric banding is grossly unremarkable in appearance. Electronically Signed   By: Garald Balding M.D.   On: 12/02/2016 20:14    EKG: Independently reviewed. Rate 85, PVCs, no Q waves.  Left heart cath 9/17: EF 55-65% Moderate LAD in stent restenosis, no intervention          Assessment/Plan  1. Lactic acidosis:  The  patient is noted to have a lactate>2 and trending up. With the current information available to me, I do not think the patient is in septic shock.   THere is no fever or reported fever, no focal symptoms of infection, and no leukocytosis.  Suspect this is a type B lactic acidosis, but not sure from what (not on metformin, no hypoxia or asthma).  Will obtain echo to rule out type A.  -Check BNP -Obtain echocardiogram -Obtain CXR -Obtain blood cultures and monitor for fever   2. Hypokalemia:  70 meQ K and empiric mag given in ER. -MIVF with K overnight -Check K, mag in AM  3. Epigastric pain:  Patient is concerned this is GI in origin, and stopped Plavix yesterday in anticipation of endoscopy with Dr. Benson Norway next Thursday.  Within the differential theoretically is completed MI 3 weeks ago.  Pancreatitis ruled out. -NPO and MIVF -Ondansetron or promethazine for nausea -Consult to GI, appreciate cares  4. Elevated creatinine, mild AKI:  -Repeat Cr after fluids  5. Diastolic chronic CHF, hypertension, CAD secondary prevention:  Hard to estimate volume status given habitus.  EF 55% last year. -Continue furosemide -Continue aspirin, statin, Zetia, ACE, BB  6. Rheumatoid arthritis:  -Continue prednisone, Arava  7. Other medications:  -Continue allopurinol -Continue Zmbien  -Continue desloratadine         DVT prophylaxis: Lovenox  Code Status: FULL  Family Communication: Husband at bedside  Disposition Plan: Anticipate replete K overnight and obtain echocardiogram and CXR/BNP.  Discuss with GI tomorrow Consults called: None Admission status: OBS At the point of initial evaluation, it is my clinical opinion that admission for OBSERVATION is reasonable and necessary because the patient's presenting complaints in the context of their chronic conditions represent sufficient risk of deterioration or significant morbidity to constitute reasonable grounds for close observation in the  hospital setting, but that the patient may be medically stable for discharge from the hospital within 24 to 48 hours.    Medical decision making: Patient seen at 10:45 PM on 12/02/2016.  The patient was discussed with Doreen Salvage, PA-C.  What exists of the patient's chart was reviewed in depth and summarized above.  Clinical condition: stable.        Edwin Dada [facility] Pager [phone]

## 2016-12-02 NOTE — ED Notes (Signed)
Patient transported to CT 

## 2016-12-03 ENCOUNTER — Observation Stay (HOSPITAL_BASED_OUTPATIENT_CLINIC_OR_DEPARTMENT_OTHER): Payer: Medicare Other

## 2016-12-03 ENCOUNTER — Encounter (HOSPITAL_COMMUNITY)
Admission: EM | Disposition: A | Discharge status: Home / Self Care | Payer: Self-pay | Source: Home / Self Care | Attending: Internal Medicine

## 2016-12-03 ENCOUNTER — Encounter (HOSPITAL_COMMUNITY): Payer: Self-pay

## 2016-12-03 ENCOUNTER — Observation Stay (HOSPITAL_COMMUNITY): Payer: Medicare Other | Admitting: Certified Registered Nurse Anesthetist

## 2016-12-03 DIAGNOSIS — Z79899 Other long term (current) drug therapy: Secondary | ICD-10-CM | POA: Diagnosis not present

## 2016-12-03 DIAGNOSIS — I429 Cardiomyopathy, unspecified: Secondary | ICD-10-CM | POA: Diagnosis not present

## 2016-12-03 DIAGNOSIS — Z9884 Bariatric surgery status: Secondary | ICD-10-CM | POA: Diagnosis not present

## 2016-12-03 DIAGNOSIS — Z880 Allergy status to penicillin: Secondary | ICD-10-CM | POA: Diagnosis not present

## 2016-12-03 DIAGNOSIS — D649 Anemia, unspecified: Secondary | ICD-10-CM | POA: Diagnosis present

## 2016-12-03 DIAGNOSIS — Z7982 Long term (current) use of aspirin: Secondary | ICD-10-CM | POA: Diagnosis not present

## 2016-12-03 DIAGNOSIS — E860 Dehydration: Secondary | ICD-10-CM | POA: Diagnosis present

## 2016-12-03 DIAGNOSIS — N179 Acute kidney failure, unspecified: Secondary | ICD-10-CM | POA: Diagnosis present

## 2016-12-03 DIAGNOSIS — B3781 Candidal esophagitis: Secondary | ICD-10-CM | POA: Diagnosis present

## 2016-12-03 DIAGNOSIS — R112 Nausea with vomiting, unspecified: Secondary | ICD-10-CM | POA: Diagnosis not present

## 2016-12-03 DIAGNOSIS — E872 Acidosis: Secondary | ICD-10-CM | POA: Diagnosis present

## 2016-12-03 DIAGNOSIS — M069 Rheumatoid arthritis, unspecified: Secondary | ICD-10-CM | POA: Diagnosis present

## 2016-12-03 DIAGNOSIS — I5032 Chronic diastolic (congestive) heart failure: Secondary | ICD-10-CM | POA: Diagnosis present

## 2016-12-03 DIAGNOSIS — I110 Hypertensive heart disease with heart failure: Secondary | ICD-10-CM | POA: Diagnosis present

## 2016-12-03 DIAGNOSIS — R1084 Generalized abdominal pain: Secondary | ICD-10-CM | POA: Diagnosis not present

## 2016-12-03 DIAGNOSIS — E876 Hypokalemia: Secondary | ICD-10-CM | POA: Diagnosis not present

## 2016-12-03 DIAGNOSIS — R079 Chest pain, unspecified: Secondary | ICD-10-CM | POA: Diagnosis not present

## 2016-12-03 DIAGNOSIS — R1013 Epigastric pain: Secondary | ICD-10-CM | POA: Diagnosis not present

## 2016-12-03 DIAGNOSIS — I2510 Atherosclerotic heart disease of native coronary artery without angina pectoris: Secondary | ICD-10-CM | POA: Diagnosis present

## 2016-12-03 HISTORY — PX: ESOPHAGOGASTRODUODENOSCOPY: SHX5428

## 2016-12-03 LAB — BASIC METABOLIC PANEL
ANION GAP: 12 (ref 5–15)
ANION GAP: 10 (ref 5–15)
BUN: 5 mg/dL — ABNORMAL LOW (ref 6–20)
BUN: <5 mg/dL — ABNORMAL LOW (ref 6–20)
CALCIUM: 7.3 mg/dL — AB (ref 8.9–10.3)
CHLORIDE: 99 mmol/L — AB (ref 101–111)
CO2: 25 mmol/L (ref 22–32)
CO2: 22 mmol/L (ref 22–32)
CREATININE: 0.72 mg/dL (ref 0.44–1.00)
Calcium: 6.9 mg/dL — ABNORMAL LOW (ref 8.9–10.3)
Chloride: 102 mmol/L (ref 101–111)
Creatinine, Ser: 0.82 mg/dL (ref 0.44–1.00)
GFR calc Af Amer: >60 mL/min (ref >60)
GFR calc non Af Amer: >60 mL/min (ref >60)
GLUCOSE: 126 mg/dL — AB (ref 65–99)
Glucose, Bld: 147 mg/dL — ABNORMAL HIGH (ref 65–99)
POTASSIUM: 2.5 mmol/L — AB (ref 3.5–5.1)
Potassium: 4.2 mmol/L (ref 3.5–5.1)
SODIUM: 136 mmol/L (ref 135–145)
SODIUM: 134 mmol/L — AB (ref 135–145)

## 2016-12-03 LAB — BRAIN NATRIURETIC PEPTIDE: B Natriuretic Peptide: 106.2 pg/mL — ABNORMAL HIGH (ref 0.0–100.0)

## 2016-12-03 LAB — CBC
HCT: 33.2 % — ABNORMAL LOW (ref 36.0–46.0)
HEMOGLOBIN: 10.6 g/dL — AB (ref 12.0–15.0)
MCH: 28.7 pg (ref 26.0–34.0)
MCHC: 31.9 g/dL (ref 30.0–36.0)
MCV: 90 fL (ref 78.0–100.0)
PLATELETS: 246 10*3/uL (ref 150–400)
RBC: 3.69 MIL/uL — AB (ref 3.87–5.11)
RDW: 15.1 % (ref 11.5–15.5)
WBC: 8.3 10*3/uL (ref 4.0–10.5)

## 2016-12-03 LAB — LACTIC ACID, PLASMA
Lactic Acid, Venous: 3.3 mmol/L (ref 0.5–1.9)
Lactic Acid, Venous: 2.3 mmol/L (ref 0.5–1.9)

## 2016-12-03 LAB — ECHOCARDIOGRAM COMPLETE
HEIGHTINCHES: 62 in
WEIGHTICAEL: 3828.8 [oz_av]

## 2016-12-03 LAB — MAGNESIUM
MAGNESIUM: 1.5 mg/dL — AB (ref 1.7–2.4)
Magnesium: 2.1 mg/dL (ref 1.7–2.4)

## 2016-12-03 SURGERY — EGD (ESOPHAGOGASTRODUODENOSCOPY)
Anesthesia: Monitor Anesthesia Care

## 2016-12-03 MED ORDER — RAMIPRIL 2.5 MG PO CAPS: 2.5000 mg | ORAL_CAPSULE | Freq: Every day | ORAL | Status: DC

## 2016-12-03 MED ORDER — CLOPIDOGREL BISULFATE 75 MG PO TABS: 75.0000 mg | ORAL_TABLET | Freq: Every day | ORAL | Status: DC

## 2016-12-03 MED ORDER — FUROSEMIDE 40 MG PO TABS: 40.0000 mg | ORAL_TABLET | Freq: Every day | ORAL | Status: DC

## 2016-12-03 MED ORDER — PROMETHAZINE HCL 25 MG PO TABS: 25.0000 mg | ORAL_TABLET | Freq: Four times a day (QID) | ORAL | Status: DC | PRN

## 2016-12-03 MED ORDER — ONDANSETRON HCL 4 MG/2ML IJ SOLN
INTRAMUSCULAR | Status: DC | PRN
  Administered 2016-12-03: 4 mg via INTRAVENOUS

## 2016-12-03 MED ORDER — ZOLPIDEM TARTRATE 5 MG PO TABS
5.0000 mg | ORAL_TABLET | Freq: Every day | ORAL | Status: DC
  Administered 2016-12-03: 5 mg via ORAL
  Filled 2016-12-03: qty 1

## 2016-12-03 MED ORDER — POTASSIUM CHLORIDE CRYS ER 20 MEQ PO TBCR
40.0000 meq | EXTENDED_RELEASE_TABLET | Freq: Once | ORAL | Status: AC
  Administered 2016-12-03: 40 meq via ORAL
  Filled 2016-12-03: qty 2

## 2016-12-03 MED ORDER — SODIUM CHLORIDE 0.9 % IV SOLN: INTRAVENOUS | Status: DC

## 2016-12-03 MED ORDER — LEFLUNOMIDE 20 MG PO TABS
20.0000 mg | ORAL_TABLET | Freq: Every day | ORAL | Status: DC
  Administered 2016-12-03: 20 mg via ORAL
  Filled 2016-12-03 (×2): qty 1

## 2016-12-03 MED ORDER — LIDOCAINE 2% (20 MG/ML) 5 ML SYRINGE
INTRAMUSCULAR | Status: DC | PRN
  Administered 2016-12-03: 40 mg via INTRAVENOUS

## 2016-12-03 MED ORDER — PANTOPRAZOLE SODIUM 40 MG PO TBEC
40.0000 mg | DELAYED_RELEASE_TABLET | Freq: Two times a day (BID) | ORAL | Status: DC
  Administered 2016-12-03: 40 mg via ORAL
  Filled 2016-12-03 (×3): qty 1

## 2016-12-03 MED ORDER — PROPOFOL 500 MG/50ML IV EMUL
INTRAVENOUS | Status: DC | PRN
  Administered 2016-12-03: 100 ug/kg/min via INTRAVENOUS

## 2016-12-03 MED ORDER — PROPOFOL 10 MG/ML IV BOLUS
INTRAVENOUS | Status: DC | PRN
  Administered 2016-12-03 (×6): 10 mg via INTRAVENOUS

## 2016-12-03 MED ORDER — BUTAMBEN-TETRACAINE-BENZOCAINE 2-2-14 % EX AERO
INHALATION_SPRAY | CUTANEOUS | Status: DC | PRN
  Administered 2016-12-03: 1 via TOPICAL

## 2016-12-03 MED ORDER — PROMETHAZINE HCL 25 MG/ML IJ SOLN: 12.5000 mg | Freq: Four times a day (QID) | INTRAMUSCULAR | Status: DC | PRN

## 2016-12-03 MED ORDER — EZETIMIBE 10 MG PO TABS
10.0000 mg | ORAL_TABLET | Freq: Every day | ORAL | Status: DC
  Administered 2016-12-03: 10 mg via ORAL
  Filled 2016-12-03: qty 1

## 2016-12-03 MED ORDER — ALLOPURINOL 300 MG PO TABS
300.0000 mg | ORAL_TABLET | Freq: Every day | ORAL | Status: DC
  Administered 2016-12-03: 300 mg via ORAL
  Filled 2016-12-03: qty 1

## 2016-12-03 MED ORDER — PREDNISONE 10 MG PO TABS
10.0000 mg | ORAL_TABLET | Freq: Every day | ORAL | Status: DC
  Administered 2016-12-03: 10 mg via ORAL
  Filled 2016-12-03: qty 1

## 2016-12-03 MED ORDER — LORATADINE 10 MG PO TABS
10.0000 mg | ORAL_TABLET | Freq: Every day | ORAL | Status: DC
  Administered 2016-12-03: 10 mg via ORAL
  Filled 2016-12-03: qty 1

## 2016-12-03 MED ORDER — ALUM & MAG HYDROXIDE-SIMETH 200-200-20 MG/5ML PO SUSP: 30.0000 mL | ORAL | Status: DC | PRN

## 2016-12-03 MED ORDER — ATORVASTATIN CALCIUM 80 MG PO TABS
80.0000 mg | ORAL_TABLET | Freq: Every day | ORAL | Status: DC
  Filled 2016-12-03: qty 1

## 2016-12-03 MED ORDER — POTASSIUM CHLORIDE IN NACL 40-0.9 MEQ/L-% IV SOLN
INTRAVENOUS | Status: DC
  Administered 2016-12-03: 125 mL/h via INTRAVENOUS
  Filled 2016-12-03: qty 1000

## 2016-12-03 MED ORDER — FLUCONAZOLE IN SODIUM CHLORIDE 200-0.9 MG/100ML-% IV SOLN
200.0000 mg | INTRAVENOUS | Status: DC
  Administered 2016-12-03: 200 mg via INTRAVENOUS
  Filled 2016-12-03 (×2): qty 100

## 2016-12-03 MED ORDER — SUCRALFATE 1 GM/10ML PO SUSP
1.0000 g | Freq: Three times a day (TID) | ORAL | Status: DC
  Administered 2016-12-03: 1 g via ORAL
  Filled 2016-12-03 (×2): qty 10

## 2016-12-03 MED ORDER — LACTATED RINGERS IV SOLN: INTRAVENOUS | Status: DC

## 2016-12-03 MED ORDER — MAGNESIUM SULFATE 2 GM/50ML IV SOLN
2.0000 g | Freq: Once | INTRAVENOUS | Status: AC
  Administered 2016-12-03: 2 g via INTRAVENOUS
  Filled 2016-12-03: qty 50

## 2016-12-03 MED ORDER — ENOXAPARIN SODIUM 40 MG/0.4ML ~~LOC~~ SOLN
40.0000 mg | Freq: Every day | SUBCUTANEOUS | Status: DC
  Filled 2016-12-03: qty 0.4

## 2016-12-03 MED ORDER — POTASSIUM CHLORIDE CRYS ER 20 MEQ PO TBCR
40.0000 meq | EXTENDED_RELEASE_TABLET | ORAL | Status: DC
  Administered 2016-12-03: 40 meq via ORAL
  Filled 2016-12-03: qty 2

## 2016-12-03 MED ORDER — ASPIRIN EC 81 MG PO TBEC
81.0000 mg | DELAYED_RELEASE_TABLET | Freq: Every day | ORAL | Status: DC
  Administered 2016-12-03: 81 mg via ORAL
  Filled 2016-12-03: qty 1

## 2016-12-03 MED ORDER — LACTATED RINGERS IV SOLN
INTRAVENOUS | Status: DC | PRN
  Administered 2016-12-03: 14:00:00 via INTRAVENOUS

## 2016-12-03 MED ORDER — PERFLUTREN LIPID MICROSPHERE
1.0000 mL | INTRAVENOUS | Status: AC | PRN
  Administered 2016-12-03: 4 mL via INTRAVENOUS
  Filled 2016-12-03: qty 10

## 2016-12-03 MED ORDER — PNEUMOCOCCAL VAC POLYVALENT 25 MCG/0.5ML IJ INJ
0.5000 mL | INJECTION | INTRAMUSCULAR | Status: DC
  Filled 2016-12-03: qty 0.5

## 2016-12-03 MED ORDER — PROMETHAZINE HCL 25 MG RE SUPP: 25.0000 mg | Freq: Four times a day (QID) | RECTAL | Status: DC | PRN

## 2016-12-03 MED ORDER — METOPROLOL TARTRATE 50 MG PO TABS: 50.0000 mg | ORAL_TABLET | Freq: Every day | ORAL | Status: DC

## 2016-12-03 MED ORDER — POTASSIUM CHLORIDE 20 MEQ/15ML (10%) PO SOLN
40.0000 meq | Freq: Two times a day (BID) | ORAL | Status: DC
  Administered 2016-12-03: 40 meq via ORAL
  Filled 2016-12-03 (×2): qty 30

## 2016-12-03 NOTE — Progress Notes (Signed)
[facility] Progress Note  Patient: Cindy Holmes EQA:[id number]   PCP: Redmond School, MD DOB: [birth year]   DOA: 12/02/2016   DOS: 12/03/2016   Date of Service: the patient was seen and examined on 12/03/2016  Subjective: Continues to complain of substernal chest pain which is continuous and nonradiating. Worsened with solid food. Described as sharp pain. No nausea but has dry heaving associated with that. No constipation. No abdominal pain elsewhere. No trouble breathing.  Brief hospital course: Pt. with PMH of CAD, RA, chronic diastolic CHF, lap band 8 years ago; admitted on 12/02/2016, presented with complaint of epigastric pain, was found to have esophagitis. Currently further plan is continue monitoring for symptom improvement in diet tolerance.  Assessment and Plan: 1. Candida esophagitis Patient presents with complains of 3 weeks of epigastric chest pain progressively worsening, associated more with solid food. Patient was seen by GI as an outpatient and was recommended to undergo EGD which was scheduled on next Thursday. For last 2 days the pain has progressively worsened and the patient was more and more lethargic and therefore was brought to the hospital. GI was consulted patient has been off of her Plavix since Tuesday. He was taken for EGD, that shows presence of esophagitis likely due to Candida infection. This is likely due to her being on RA medication with chronic steroids. Currently started on IV fluconazole, continue Protonix continue Carafate. Started back on regular diet and will monitor for tolerance.  2. Lactic acidosis, dehydration. Patient presented with lactic acid level of 3.1 which was worsening to 3.99. At present I do not suspect that the patient has an ongoing active sepsis and this appears to be anemia presentation with dehydration. Patient has been given IV hydration, I will recheck a lactic acid and monitor the results.  3. Chest pain. History of  CAD Troponins and EKGs are more consistent with ACS, doesn't patient is also not consistent with ACS. Echocardiogram has been performed shows EF of 97-98%, grade 2 diastolic dysfunction, no wall motion abnormalities. Continue aspirin, resume Plavix 75 mg, continue Lipitor 20 as well. Lasix is currently on hold will continue to monitor. Continue metoprolol 50 mg starting tomorrow  4. Hypokalemia. Likely due to poor mouth intake. Replace and recheck.  5. Anemia, presenting hemoglobin with 12.8, currently it is 10.2, stable. No active bleeding reported, no evidence of bleeding seen on the EGD as well. At present I suspect this is dilutional and we will continue to monitor.  Diet: Regular diet DVT Prophylaxis: subcutaneous Heparin  Advance goals of care discussion: Full code  Family Communication: family was present at bedside, at the time of interview. The pt provided permission to discuss medical plan with the family. Opportunity was given to ask question and all questions were answered satisfactorily.   Disposition:  Discharge to home 1-2 days.  Consultants: gastroenterology  Procedures: EGD  Antibiotics: Anti-infectives    Start     Dose/Rate Route Frequency Ordered Stop   12/03/16 1600  fluconazole (DIFLUCAN) IVPB 200 mg     200 mg 100 mL/hr over 60 Minutes Intravenous Every 24 hours 12/03/16 1515         Objective: Physical Exam: Vitals:   12/03/16 1445 12/03/16 1450 12/03/16 1455 12/03/16 1500  BP: 126/77  (!) 145/64 134/66  Pulse: 89 89 87 87  Resp: (!) 23 16 16 19   Temp:      TempSrc:      SpO2: 99% 99% 99% 99%  Weight:  Height:        Intake/Output Summary (Last 24 hours) at 12/03/16 1640 Last data filed at 12/03/16 9833  Gross per 24 hour  Intake           228.33 ml  Output              200 ml  Net            28.33 ml   Filed Weights   12/02/16 1623 12/03/16 0200  Weight: 99.8 kg (220 lb) 108.5 kg (239 lb 4.8 oz)   General: Alert, Awake and  Oriented to Time, Place and Person. Appear in moderate distress, affect appropriate Eyes: PERRL, Conjunctiva normal ENT: Oral Mucosa clear moist. Neck: difficult to assess JVD, no Abnormal Mass Or lumps Cardiovascular: S1 and S2 Present, no Murmur, Respiratory: Bilateral Air entry equal and Decreased, no use of accessory muscle, Clear to Auscultation, no Crackles, no wheezes Abdomen: Bowel Sound present, Soft and epigastric tenderness Skin: no redness, no Rash, no induration Extremities: no Pedal edema, no calf tenderness Neurologic: Grossly no focal neuro deficit. Bilaterally Equal motor strength  Data Reviewed: CBC:  Recent Labs Lab 12/02/16 1643 12/02/16 2323 12/03/16 0258  WBC 9.2  --  8.3  HGB 12.8 10.2* 10.6*  HCT 38.6 30.0* 33.2*  MCV 88.7  --  90.0  PLT 336  --  825   Basic Metabolic Panel:  Recent Labs Lab 12/02/16 1643 12/02/16 2323 12/03/16 0258  NA 134* 136 136  K 2.3* 3.0* 2.5*  CL 93* 95* 99*  CO2 26  --  25  GLUCOSE 157* 137* 126*  BUN 7 6 5*  CREATININE 1.00 0.80 0.82  CALCIUM 7.8*  --  6.9*  MG  --   --  1.5*    Liver Function Tests:  Recent Labs Lab 12/02/16 1643  AST 29  ALT 35  ALKPHOS 89  BILITOT 0.4  PROT 6.2*  ALBUMIN 3.3*    Recent Labs Lab 12/02/16 1643  LIPASE 31   No results for input(s): AMMONIA in the last 168 hours. Coagulation Profile: No results for input(s): INR, PROTIME in the last 168 hours. Cardiac Enzymes: No results for input(s): CKTOTAL, CKMB, CKMBINDEX, TROPONINI in the last 168 hours. BNP (last 3 results) No results for input(s): PROBNP in the last 8760 hours. CBG: No results for input(s): GLUCAP in the last 168 hours. Studies: Dg Chest 2 View  Result Date: 12/03/2016 CLINICAL DATA:  Chest pain, shortness of breath, and nausea for 3 weeks. History of hypertension. Lactic acidosis. EXAM: CHEST  2 VIEW COMPARISON:  10/11/2016 FINDINGS: Shallow inspiration. Mild cardiac enlargement with mild prominence of  pulmonary vascularity. Right hilum is somewhat prominent but this has been present previously, likely representing prominent vascularity. Atelectasis in the lung bases. No edema or consolidation in the lungs. No blunting of costophrenic angles. No pneumothorax. Tortuous aorta. Gastric lap band is present. Surgical clips in the right upper quadrant. IMPRESSION: Cardiac enlargement with mild pulmonary vascular congestion. No edema or consolidation. Electronically Signed   By: Lucienne Capers M.D.   On: 12/03/2016 00:12   Ct Angio Abd/pel W And/or Wo Contrast  Result Date: 12/02/2016 CLINICAL DATA:  Acute onset of generalized abdominal pain, nausea and vomiting. Elevated lactic acid. Initial encounter. EXAM: CTA ABDOMEN AND PELVIS wITHOUT AND WITH CONTRAST TECHNIQUE: Multidetector CT imaging of the abdomen and pelvis was performed using the standard protocol during bolus administration of intravenous contrast. Multiplanar reconstructed images and MIPs were obtained and  reviewed to evaluate the vascular anatomy. CONTRAST:  100 mL of Isovue 370 IV contrast COMPARISON:  MRI of the lumbar spine performed 03/29/2015 FINDINGS: VASCULAR Aorta: Scattered calcification is seen along the abdominal aorta and branches. No significant luminal narrowing is seen. There is no evidence of aortic dissection. There is no evidence of aneurysmal dilatation. Celiac: The celiac trunk appears intact, with mild calcification at the origin of the celiac trunk. SMA: The superior mesenteric artery remains patent, with scattered calcification at the origin of the superior mesenteric artery. Renals: There is likely moderate to severe luminal narrowing at the origin of the left renal artery. Would correlate clinically for refractory hypertension. Scattered calcification is seen at the origins of both renal arteries. IMA: The inferior mesenteric artery remains grossly patent. Mild calcification is seen at the origin of the inferior mesenteric  artery. Inflow: Mild calcification is seen along the internal iliac arteries bilaterally. The common and external iliac arteries, and common femoral arteries, are grossly unremarkable in appearance. Proximal Outflow: The proximal branches of the common femoral arteries appear grossly intact bilaterally. Veins: Visualized venous structures are grossly unremarkable. The inferior vena cava is unremarkable in appearance. Review of the MIP images confirms the above findings. NON-VASCULAR Lower chest: Scattered coronary artery calcifications are seen. Right basilar scarring is noted. The patient's gastric banding is grossly unremarkable in appearance. Hepatobiliary: The liver is unremarkable in appearance. The patient is status post cholecystectomy, with clips noted at the gallbladder fossa. The common bile duct remains normal in caliber. Pancreas: The pancreas is within normal limits. Spleen: The spleen is unremarkable in appearance. Adrenals/Urinary Tract: The adrenal glands are unremarkable in appearance. A small right renal cyst is noted. Nonspecific perinephric stranding is noted bilaterally. There is no evidence of hydronephrosis. No renal or ureteral stones are identified. Stomach/Bowel: The stomach is unremarkable in appearance. The small bowel is within normal limits. The appendix is not visualized; there is no evidence for appendicitis. Scattered diverticulosis is noted along the proximal sigmoid colon, without evidence of diverticulitis. Lymphatic: No retroperitoneal or pelvic sidewall lymphadenopathy is seen. Reproductive: The bladder is mildly distended and grossly remarkable. The uterus is unremarkable in appearance. The ovaries are grossly symmetric. No suspicious adnexal masses are seen. Other: No additional soft tissue abnormalities are seen. Musculoskeletal: No acute osseous abnormalities are identified. Facet disease is noted at the lower lumbar spine. The visualized musculature is unremarkable in  appearance. IMPRESSION: VASCULAR 1. Scattered aortic atherosclerosis. 2. Likely moderate to severe luminal narrowing at the origin of the left renal artery. Would correlate clinically for refractory hypertension, and consider treatment as deemed clinically appropriate. 3. Scattered coronary artery calcifications seen. NON-VASCULAR 1. Right basilar scarring noted. Visualized lung bases otherwise clear. 2. Small right renal cyst. 3. Scattered diverticulosis along the proximal sigmoid colon, without evidence of diverticulitis. 4. Gastric banding is grossly unremarkable in appearance. Electronically Signed   By: Garald Balding M.D.   On: 12/02/2016 20:14    Scheduled Meds: . allopurinol  300 mg Oral Daily  . aspirin EC  81 mg Oral Daily  . atorvastatin  80 mg Oral q1800  . [START ON 12/04/2016] clopidogrel  75 mg Oral Daily  . enoxaparin (LOVENOX) injection  40 mg Subcutaneous Daily  . ezetimibe  10 mg Oral Daily  . leflunomide  20 mg Oral Daily  . loratadine  10 mg Oral Daily  . pantoprazole  40 mg Oral BID AC  . [START ON 12/04/2016] pneumococcal 23 valent vaccine  0.5 mL  Intramuscular Tomorrow-1000  . potassium chloride  40 mEq Oral BID  . predniSONE  10 mg Oral Q breakfast  . sucralfate  1 g Oral TID WC & HS  . zolpidem  5 mg Oral QHS   Continuous Infusions: . fluconazole (DIFLUCAN) IV    . lactated ringers     PRN Meds: acetaminophen **OR** acetaminophen, alum & mag hydroxide-simeth, ondansetron **OR** ondansetron (ZOFRAN) IV, promethazine **OR** promethazine **OR** promethazine  Time spent: 35 minutes  Author: Berle Mull, MD [facility] Pager: [phone] 12/03/2016 4:40 PM  If 7PM-7AM, please contact night-coverage at [email or web address], password [email or web address]

## 2016-12-03 NOTE — Care Management Obs Status (Signed)
[employer] NOTIFICATION   Patient Details  Name: Cindy Holmes MRN: [id number] Date of Birth: Dec 06, 1950   Medicare Observation Status Notification Given:  Yes    Bethena Roys, RN 12/03/2016, 4:42 PM

## 2016-12-03 NOTE — Progress Notes (Signed)
CRITICAL VALUE ALERT  Critical Value:  Potassium 2.5 and Lactic Acid 3.3   Date & Time Notied:  0425  Provider Notified: Opyd  Orders Received/Actions taken: 20 mEq K PO ordered and magnesium Sulfate PB

## 2016-12-03 NOTE — Op Note (Signed)
[facility] Patient Name: Cindy Holmes Procedure Date : 12/03/2016 MRN: [id number] Attending MD: Carol Ada , MD Date of Birth: [birth year] CSN: [id number] Age: 66 Admit Type: Inpatient Procedure:                Upper GI endoscopy Indications:              Epigastric abdominal pain, Dysphagia Providers:                Carol Ada, MD, Cleda Daub, RN, Cletis Athens,                            Technician, Alan Mulder, Technician, Garrison [place],                            CRNA Referring MD:              Medicines:                Propofol per Anesthesia Complications:            No immediate complications. Estimated Blood Loss:     Estimated blood loss was minimal. Procedure:                Pre-Anesthesia Assessment:                           - Prior to the procedure, a History and Physical                            was performed, and patient medications and                            allergies were reviewed. The patient's tolerance of                            previous anesthesia was also reviewed. The risks                            and benefits of the procedure and the sedation                            options and risks were discussed with the patient.                            All questions were answered, and informed consent                            was obtained. Prior Anticoagulants: The patient has                            taken Plavix (clopidogrel), last dose was 3 days                            prior to procedure. ASA Grade Assessment: III - A  patient with severe systemic disease. After                            reviewing the risks and benefits, the patient was                            deemed in satisfactory condition to undergo the                            procedure.                           - Sedation was administered by an anesthesia                            professional. Deep sedation was attained.               After obtaining informed consent, the endoscope was                            passed under direct vision. Throughout the                            procedure, the patient's blood pressure, pulse, and                            oxygen saturations were monitored continuously. The                            EG-[id number] ([id number]) scope was introduced through the                            mouth, and advanced to the second part of duodenum.                            The upper GI endoscopy was accomplished without                            difficulty. The patient tolerated the procedure                            well. Scope In: Scope Out: Findings:      A possible patchy candidiasis was found in the middle third of the       esophagus and in the lower third of the esophagus. Biopsies were taken       with a cold forceps for histology.      The stomach was normal. The lap band was intact and no complications       from this appliance.      The examined duodenum was normal. Impression:               - Possible monilial esophagitis. Biopsied.                           - Normal stomach.                           -  Normal examined duodenum. Moderate Sedation:      None Recommendation:           - Return patient to hospital ward for ongoing care.                           - Resume regular diet.                           - Continue present medications.                           - Await pathology results.                           - Emperic treatment with fluconazole 200 mg IV. Procedure Code(s):        --- Professional ---                           [phone], Esophagogastroduodenoscopy, flexible,                            transoral; with biopsy, single or multiple Diagnosis Code(s):        --- Professional ---                           B37.81, Candidal esophagitis                           R10.13, Epigastric pain                           R13.10, Dysphagia, unspecified CPT copyright  2016 [employer]. All rights reserved. The codes documented in this report are preliminary and upon coder review may  be revised to meet current compliance requirements. Carol Ada, MD Carol Ada, MD 12/03/2016 2:41:19 PM This report has been signed electronically. Number of Addenda: 0

## 2016-12-03 NOTE — Consult Note (Signed)
Reason for Consult: Epigastric pain Referring Physician: [facility]  [name] HPI: Three weeks ago the patient started to have pain and a "sick" sensation. The symptoms continued to worsen over these past three weeks. The abdominal pain is not constant, but she had a constant nausea. She felt that if she uses a laxative she can help to clean herself out. The laxative was effective, but it did not improve her symptoms. She denies any weight loss, even though she has cut back on PO intake with solid foods. The patient feels as if the food does not go out of her stomach. Some times it can back up into her chest. Ten years ago, with Dr. Fuller Plan, she had some abdominal issues and an EGD/Colonoscopy was performed. No acute findings were identified, but she was noted to have some polyps and diverticula. In 1982 she had abdominal pain secondary to gallstones and a CBD obstruction secondary to a stone. Some of her symptoms are reminiscent to her prior symptoms. A lap band was placed by a surgeon at [facility] and she denies any problems with her lap band. She is s/p stent placement and currently takes Plavix. Dr. Ottie Glazier currently follows her cardiac issues. RA and gout are issues and she takes prednionse with her methotrexate to control her RA. Blood work and the CT scan in the ER was negative for any acute abnormalities.  Cindy Holmes was evaluated earlier in the week and scheduled for an EGD this coming week as she takes Plavix for her CAD s/p stenting.  Unfortunately she could not wait the allotted time and she presented to the ER.  Past Medical History:  Diagnosis Date  . CAD (coronary artery disease)    a. PTCA LAD 2001/cath 2002-prox and mid LAD stents patent, PTCA ostium Dx 1 b. cath 03/2016: patent LAD stent with less than 40% in-stent restenosis, 10-30% stenosis of D1 and distal LAD with continued medical management recommended.  . Carotid artery disease ([place])    Doppler September, 2011, 0-39%  bilateral disease mild  . Edema   . Emotional stress reaction    8/11  . HTN (hypertension)   . Hyperlipidemia   . Leg pain    July, 2012, Arterial Dopplers March 08, 2011 normal  . 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 PONV (postoperative nausea and vomiting)   . Rheumatoid arthritis ([place])   . Right rotator cuff tear 11/16/2013  . Stroke [facility])    mini stroke in past ???    Past Surgical History:  Procedure Laterality Date  . CARDIAC CATHETERIZATION     with stent placement in 2001  . CARDIAC CATHETERIZATION N/A 03/12/2016   Procedure: Left Heart Cath and Coronary Angiography;  Surgeon: Nelva Bush, MD;  Location: [facility];  Service: Cardiovascular;  Laterality: N/A;  . CARDIAC CATHETERIZATION N/A 03/12/2016   Procedure: Intravascular Pressure Wire/FFR Study;  Surgeon: Nelva Bush, MD;  Location: [facility];  Service: Cardiovascular;  Laterality: N/A;  . CHOLECYSTECTOMY    . CORONARY ANGIOPLASTY     2002  . laproscopic adjustable gastric  banding     with APS standard system.   . Pt has 3 stents     sept 2001  . SHOULDER ARTHROSCOPY WITH SUBACROMIAL DECOMPRESSION, ROTATOR CUFF REPAIR AND BICEP TENDON REPAIR Right 11/16/2013   Procedure: RIGHT SHOULDER ARTHROSCOPY, EXTENSIVE DEBRIDEMENT,  ROTATOR CUFF REPAIR ;  Surgeon: [name], MD;  Location: [place];  Service: Orthopedics;  Laterality: Right;  . TUBAL LIGATION      Family History  Problem Relation Age of Onset  . Heart attack Mother   . Heart disease Mother   . Diabetes Brother   . Heart disease Brother   . Diabetes Sister   . Arthritis/Rheumatoid Father   . Other Father        hepatic cirrhosis  . Heart disease Maternal Aunt   . Cancer Maternal Aunt        throat  . Heart attack Maternal Aunt   . Heart attack Maternal Uncle   . Heart disease Brother   .  Cancer Unknown        family history  . Heart attack Unknown        family history  . Cancer Maternal Grandmother        colon  . Cancer Maternal Grandfather        throat    Social History:  reports that she has never smoked. She has never used smokeless tobacco. She reports that she does not drink alcohol or use drugs.  Allergies:  Allergies  Allergen Reactions  . Codeine Phosphate Shortness Of Breath    REACTION: unspecified  . Amoxicillin Nausea And Vomiting    REACTION: unspecified  . Penicillins Nausea And Vomiting    Has patient had a PCN reaction causing immediate rash, facial/tongue/throat swelling, SOB or lightheadedness with hypotension:YES Has patient had a PCN reaction causing severe rash involving mucus membranes or skin necrosis: NO Has patient had a PCN reaction that required hospitalization NO Has patient had a PCN reaction occurring within the last 10 years: NO If all of the above answers are "NO", then may proceed with Cephalosporin use.  . Lidocaine Rash    Patch caused rash    Medications:  Scheduled: . allopurinol  300 mg Oral Daily  . aspirin EC  81 mg Oral Daily  . atorvastatin  80 mg Oral q1800  . enoxaparin (LOVENOX) injection  40 mg Subcutaneous Daily  . ezetimibe  10 mg Oral Daily  . leflunomide  20 mg Oral Daily  . loratadine  10 mg Oral Daily  . pantoprazole  40 mg Oral BID AC  . [START ON 12/04/2016] pneumococcal 23 valent vaccine  0.5 mL Intramuscular Tomorrow-1000  . potassium chloride  40 mEq Oral BID  . predniSONE  10 mg Oral Q breakfast  . sucralfate  1 g Oral TID WC & HS  . zolpidem  5 mg Oral QHS   Continuous:   Results for orders placed or performed during the hospital encounter of 12/02/16 (from the past 24 hour(s))  Lipase, blood     Status: None   Collection Time: 12/02/16  4:43 PM  Result Value Ref Range   Lipase 31 11 - 51 U/L  Comprehensive metabolic panel     Status: Abnormal   Collection Time: 12/02/16  4:43 PM   Result Value Ref Range   Sodium 134 (L) 135 - 145 mmol/L   Potassium 2.3 (LL) 3.5 - 5.1 mmol/L   Chloride 93 (L) 101 - 111 mmol/L   CO2 26 22 - 32 mmol/L   Glucose, Bld 157 (H) 65 - 99 mg/dL   BUN 7 6 - 20 mg/dL   Creatinine, Ser 1.00 0.44 - 1.00 mg/dL   Calcium 7.8 (L) 8.9 - 10.3 mg/dL   Total  Protein 6.2 (L) 6.5 - 8.1 g/dL   Albumin 3.3 (L) 3.5 - 5.0 g/dL   AST 29 15 - 41 U/L   ALT 35 14 - 54 U/L   Alkaline Phosphatase 89 38 - 126 U/L   Total Bilirubin 0.4 0.3 - 1.2 mg/dL   GFR calc non Af Amer 58 (L) >60 mL/min   GFR calc Af Amer >60 >60 mL/min   Anion gap 15 5 - 15  CBC     Status: None   Collection Time: 12/02/16  4:43 PM  Result Value Ref Range   WBC 9.2 4.0 - 10.5 K/uL   RBC 4.35 3.87 - 5.11 MIL/uL   Hemoglobin 12.8 12.0 - 15.0 g/dL   HCT 38.6 36.0 - 46.0 %   MCV 88.7 78.0 - 100.0 fL   MCH 29.4 26.0 - 34.0 pg   MCHC 33.2 30.0 - 36.0 g/dL   RDW 14.9 11.5 - 15.5 %   Platelets 336 150 - 400 K/uL  Urinalysis, Routine w reflex microscopic     Status: Abnormal   Collection Time: 12/02/16  5:43 PM  Result Value Ref Range   Color, Urine STRAW (A) YELLOW   APPearance CLEAR CLEAR   Specific Gravity, Urine 1.005 1.005 - 1.030   pH 5.0 5.0 - 8.0   Glucose, UA NEGATIVE NEGATIVE mg/dL   Hgb urine dipstick NEGATIVE NEGATIVE   Bilirubin Urine NEGATIVE NEGATIVE   Ketones, ur NEGATIVE NEGATIVE mg/dL   Protein, ur NEGATIVE NEGATIVE mg/dL   Nitrite NEGATIVE NEGATIVE   Leukocytes, UA NEGATIVE NEGATIVE  I-stat troponin, ED     Status: None   Collection Time: 12/02/16  6:51 PM  Result Value Ref Range   Troponin i, poc 0.01 0.00 - 0.08 ng/mL   Comment 3          I-Stat CG4 Lactic Acid, ED     Status: Abnormal   Collection Time: 12/02/16  6:53 PM  Result Value Ref Range   Lactic Acid, Venous 3.21 (HH) 0.5 - 1.9 mmol/L   Comment NOTIFIED PHYSICIAN   I-stat troponin, ED     Status: None   Collection Time: 12/02/16 11:21 PM  Result Value Ref Range   Troponin i, poc 0.01 0.00 -  0.08 ng/mL   Comment 3          I-stat Chem 8, ED     Status: Abnormal   Collection Time: 12/02/16 11:23 PM  Result Value Ref Range   Sodium 136 135 - 145 mmol/L   Potassium 3.0 (L) 3.5 - 5.1 mmol/L   Chloride 95 (L) 101 - 111 mmol/L   BUN 6 6 - 20 mg/dL   Creatinine, Ser 0.80 0.44 - 1.00 mg/dL   Glucose, Bld 137 (H) 65 - 99 mg/dL   Calcium, Ion 0.86 (LL) 1.15 - 1.40 mmol/L   TCO2 26 0 - 100 mmol/L   Hemoglobin 10.2 (L) 12.0 - 15.0 g/dL   HCT 30.0 (L) 36.0 - 46.0 %  I-Stat CG4 Lactic Acid, ED     Status: Abnormal   Collection Time: 12/02/16 11:24 PM  Result Value Ref Range   Lactic Acid, Venous 3.99 (HH) 0.5 - 1.9 mmol/L   Comment NOTIFIED PHYSICIAN   Brain natriuretic peptide     Status: Abnormal   Collection Time: 12/03/16  2:58 AM  Result Value Ref Range   B Natriuretic Peptide 106.2 (H) 0.0 - 100.0 pg/mL  CBC     Status: Abnormal   Collection Time:  12/03/16  2:58 AM  Result Value Ref Range   WBC 8.3 4.0 - 10.5 K/uL   RBC 3.69 (L) 3.87 - 5.11 MIL/uL   Hemoglobin 10.6 (L) 12.0 - 15.0 g/dL   HCT 33.2 (L) 36.0 - 46.0 %   MCV 90.0 78.0 - 100.0 fL   MCH 28.7 26.0 - 34.0 pg   MCHC 31.9 30.0 - 36.0 g/dL   RDW 15.1 11.5 - 15.5 %   Platelets 246 150 - 400 K/uL  Basic metabolic panel     Status: Abnormal   Collection Time: 12/03/16  2:58 AM  Result Value Ref Range   Sodium 136 135 - 145 mmol/L   Potassium 2.5 (LL) 3.5 - 5.1 mmol/L   Chloride 99 (L) 101 - 111 mmol/L   CO2 25 22 - 32 mmol/L   Glucose, Bld 126 (H) 65 - 99 mg/dL   BUN 5 (L) 6 - 20 mg/dL   Creatinine, Ser 0.82 0.44 - 1.00 mg/dL   Calcium 6.9 (L) 8.9 - 10.3 mg/dL   GFR calc non Af Amer >60 >60 mL/min   GFR calc Af Amer >60 >60 mL/min   Anion gap 12 5 - 15  Magnesium     Status: Abnormal   Collection Time: 12/03/16  2:58 AM  Result Value Ref Range   Magnesium 1.5 (L) 1.7 - 2.4 mg/dL  Lactic acid, plasma     Status: Abnormal   Collection Time: 12/03/16  2:58 AM  Result Value Ref Range   Lactic Acid, Venous  3.3 (HH) 0.5 - 1.9 mmol/L     Dg Chest 2 View  Result Date: 12/03/2016 CLINICAL DATA:  Chest pain, shortness of breath, and nausea for 3 weeks. History of hypertension. Lactic acidosis. EXAM: CHEST  2 VIEW COMPARISON:  10/11/2016 FINDINGS: Shallow inspiration. Mild cardiac enlargement with mild prominence of pulmonary vascularity. Right hilum is somewhat prominent but this has been present previously, likely representing prominent vascularity. Atelectasis in the lung bases. No edema or consolidation in the lungs. No blunting of costophrenic angles. No pneumothorax. Tortuous aorta. Gastric lap band is present. Surgical clips in the right upper quadrant. IMPRESSION: Cardiac enlargement with mild pulmonary vascular congestion. No edema or consolidation. Electronically Signed   By: Lucienne Capers M.D.   On: 12/03/2016 00:12   Ct Angio Abd/pel W And/or Wo Contrast  Result Date: 12/02/2016 CLINICAL DATA:  Acute onset of generalized abdominal pain, nausea and vomiting. Elevated lactic acid. Initial encounter. EXAM: CTA ABDOMEN AND PELVIS wITHOUT AND WITH CONTRAST TECHNIQUE: Multidetector CT imaging of the abdomen and pelvis was performed using the standard protocol during bolus administration of intravenous contrast. Multiplanar reconstructed images and MIPs were obtained and reviewed to evaluate the vascular anatomy. CONTRAST:  100 mL of Isovue 370 IV contrast COMPARISON:  MRI of the lumbar spine performed 03/29/2015 FINDINGS: VASCULAR Aorta: Scattered calcification is seen along the abdominal aorta and branches. No significant luminal narrowing is seen. There is no evidence of aortic dissection. There is no evidence of aneurysmal dilatation. Celiac: The celiac trunk appears intact, with mild calcification at the origin of the celiac trunk. SMA: The superior mesenteric artery remains patent, with scattered calcification at the origin of the superior mesenteric artery. Renals: There is likely moderate to severe  luminal narrowing at the origin of the left renal artery. Would correlate clinically for refractory hypertension. Scattered calcification is seen at the origins of both renal arteries. IMA: The inferior mesenteric artery remains grossly patent. Mild calcification is seen  at the origin of the inferior mesenteric artery. Inflow: Mild calcification is seen along the internal iliac arteries bilaterally. The common and external iliac arteries, and common femoral arteries, are grossly unremarkable in appearance. Proximal Outflow: The proximal branches of the common femoral arteries appear grossly intact bilaterally. Veins: Visualized venous structures are grossly unremarkable. The inferior vena cava is unremarkable in appearance. Review of the MIP images confirms the above findings. NON-VASCULAR Lower chest: Scattered coronary artery calcifications are seen. Right basilar scarring is noted. The patient's gastric banding is grossly unremarkable in appearance. Hepatobiliary: The liver is unremarkable in appearance. The patient is status post cholecystectomy, with clips noted at the gallbladder fossa. The common bile duct remains normal in caliber. Pancreas: The pancreas is within normal limits. Spleen: The spleen is unremarkable in appearance. Adrenals/Urinary Tract: The adrenal glands are unremarkable in appearance. A small right renal cyst is noted. Nonspecific perinephric stranding is noted bilaterally. There is no evidence of hydronephrosis. No renal or ureteral stones are identified. Stomach/Bowel: The stomach is unremarkable in appearance. The small bowel is within normal limits. The appendix is not visualized; there is no evidence for appendicitis. Scattered diverticulosis is noted along the proximal sigmoid colon, without evidence of diverticulitis. Lymphatic: No retroperitoneal or pelvic sidewall lymphadenopathy is seen. Reproductive: The bladder is mildly distended and grossly remarkable. The uterus is  unremarkable in appearance. The ovaries are grossly symmetric. No suspicious adnexal masses are seen. Other: No additional soft tissue abnormalities are seen. Musculoskeletal: No acute osseous abnormalities are identified. Facet disease is noted at the lower lumbar spine. The visualized musculature is unremarkable in appearance. IMPRESSION: VASCULAR 1. Scattered aortic atherosclerosis. 2. Likely moderate to severe luminal narrowing at the origin of the left renal artery. Would correlate clinically for refractory hypertension, and consider treatment as deemed clinically appropriate. 3. Scattered coronary artery calcifications seen. NON-VASCULAR 1. Right basilar scarring noted. Visualized lung bases otherwise clear. 2. Small right renal cyst. 3. Scattered diverticulosis along the proximal sigmoid colon, without evidence of diverticulitis. 4. Gastric banding is grossly unremarkable in appearance. Electronically Signed   By: Garald Balding M.D.   On: 12/02/2016 20:14    ROS:  As stated above in the HPI otherwise negative.  Blood pressure (!) 106/56, pulse 79, temperature 98.9 F (37.2 C), resp. rate 18, height 5\' 2"  (1.575 m), weight 108.5 kg (239 lb 4.8 oz), SpO2 98 %.    PE: Gen: NAD, Alert and Oriented HEENT:  [place]/AT, EOMI Neck: Supple, no LAD Lungs: CTA Bilaterally CV: RRR without M/G/R ABM: Soft, epigastric tenerness, +BS Ext: No C/C/E  Assessment/Plan: 1) Severe epigastric pain.  Unclear etiology.  ? Association with her lap band. 2) S/p lap band. 3) CAD s/p stenting.  Off Plavix x 3 days.  Plan: 1) EGD today.  Cindy Holmes D 12/03/2016, 1:02 PM

## 2016-12-03 NOTE — Anesthesia Preprocedure Evaluation (Addendum)
Anesthesia Evaluation  Patient identified by MRN, date of birth, ID band Patient awake    Reviewed: Allergy & Precautions, NPO status , Patient's Chart, lab work & pertinent test results  History of Anesthesia Complications (+) PONV and history of anesthetic complications  Airway Mallampati: II  TM Distance: >3 FB Neck ROM: Full    Dental no notable dental hx. (+) Dental Advisory Given   Pulmonary neg pulmonary ROS,    Pulmonary exam normal breath sounds clear to auscultation       Cardiovascular hypertension, Pt. on home beta blockers and Pt. on medications + CAD, + Cardiac Stents and + Peripheral Vascular Disease  Normal cardiovascular exam Rhythm:Regular Rate:Normal     Neuro/Psych RSD arm CVA negative psych ROS   GI/Hepatic negative GI ROS, Neg liver ROS,   Endo/Other  negative endocrine ROS  Renal/GU negative Renal ROS  negative genitourinary   Musculoskeletal  (+) Arthritis , Rheumatoid disorders,    Abdominal   Peds negative pediatric ROS (+)  Hematology negative hematology ROS (+)   Anesthesia Other Findings   Reproductive/Obstetrics negative OB ROS                            Anesthesia Physical Anesthesia Plan  ASA: III  Anesthesia Plan: MAC   Post-op Pain Management:    Induction: Intravenous  Airway Management Planned: Natural Airway  Additional Equipment:   Intra-op Plan:   Post-operative Plan:   Informed Consent: I have reviewed the patients History and Physical, chart, labs and discussed the procedure including the risks, benefits and alternatives for the proposed anesthesia with the patient or authorized representative who has indicated his/her understanding and acceptance.   Dental advisory given  Plan Discussed with: CRNA and Anesthesiologist  Anesthesia Plan Comments:         Anesthesia Quick Evaluation

## 2016-12-03 NOTE — Anesthesia Procedure Notes (Signed)
Procedure Name: MAC Date/Time: 12/03/2016 2:10 PM Performed by: Garrison Columbus T Pre-anesthesia Checklist: Patient identified, Emergency Drugs available, Suction available and Patient being monitored Patient Re-evaluated:Patient Re-evaluated prior to inductionOxygen Delivery Method: Nasal cannula Preoxygenation: Pre-oxygenation with 100% oxygen Intubation Type: IV induction Placement Confirmation: positive ETCO2 and breath sounds checked- equal and bilateral Dental Injury: Teeth and Oropharynx as per pre-operative assessment

## 2016-12-03 NOTE — Transfer of Care (Signed)
Immediate Anesthesia Transfer of Care Note  Patient: Cindy Holmes  Procedure(s) Performed: Procedure(s): ESOPHAGOGASTRODUODENOSCOPY (EGD) (N/A)  Patient Location: Endoscopy Unit  Anesthesia Type:MAC  Level of Consciousness: awake, alert  and oriented  Airway & Oxygen Therapy: Patient Spontanous Breathing and Patient connected to nasal cannula oxygen  Post-op Assessment: Report given to RN, Post -op Vital signs reviewed and stable and Patient moving all extremities X 4  Post vital signs: Reviewed and stable  Last Vitals:  Vitals:   12/03/16 0237 12/03/16 1343  BP: (!) 106/56 135/64  Pulse: 79 91  Resp: 18 (!) 22  Temp:      Last Pain:  Vitals:   12/03/16 1343  TempSrc: Oral  PainSc:          Complications: No apparent anesthesia complications

## 2016-12-03 NOTE — Progress Notes (Signed)
Patient can restart Plavix tomorrow.

## 2016-12-03 NOTE — Progress Notes (Signed)
I notified Dr. Posey Pronto that patients lactic acid level 2.3. Coming down from previous lab result

## 2016-12-03 NOTE — Progress Notes (Signed)
  Echocardiogram 2D Echocardiogram has been performed.  Darlina Sicilian M 12/03/2016, 12:24 PM

## 2016-12-04 ENCOUNTER — Encounter (HOSPITAL_COMMUNITY): Payer: Self-pay | Admitting: Gastroenterology

## 2016-12-04 DIAGNOSIS — B3781 Candidal esophagitis: Principal | ICD-10-CM

## 2016-12-04 LAB — BASIC METABOLIC PANEL
ANION GAP: 10 (ref 5–15)
BUN: 6 mg/dL (ref 6–20)
CALCIUM: 8.1 mg/dL — AB (ref 8.9–10.3)
CO2: 23 mmol/L (ref 22–32)
Chloride: 101 mmol/L (ref 101–111)
Creatinine, Ser: 0.74 mg/dL (ref 0.44–1.00)
GFR calc Af Amer: >60 mL/min (ref >60)
Glucose, Bld: 129 mg/dL — ABNORMAL HIGH (ref 65–99)
POTASSIUM: 3.5 mmol/L (ref 3.5–5.1)
SODIUM: 134 mmol/L — AB (ref 135–145)

## 2016-12-04 MED ORDER — ESOMEPRAZOLE MAGNESIUM 40 MG PO CPDR: 40.0000 mg | DELAYED_RELEASE_CAPSULE | Freq: Two times a day (BID) | ORAL | 0 refills | Status: DC

## 2016-12-04 MED ORDER — SUCRALFATE 1 GM/10ML PO SUSP: 1.0000 g | Freq: Three times a day (TID) | ORAL | 0 refills | Status: DC

## 2016-12-04 MED ORDER — FUROSEMIDE 40 MG PO TABS: 40.0000 mg | ORAL_TABLET | Freq: Every day | ORAL | 0 refills | Status: DC | PRN

## 2016-12-04 MED ORDER — ALUM & MAG HYDROXIDE-SIMETH 200-200-20 MG/5ML PO SUSP: 30.0000 mL | ORAL | 0 refills | Status: DC | PRN

## 2016-12-04 MED ORDER — FLUCONAZOLE 200 MG PO TABS: 200.0000 mg | ORAL_TABLET | Freq: Every day | ORAL | 0 refills | Status: AC

## 2016-12-04 MED ORDER — FLUCONAZOLE 200 MG PO TABS: 200.0000 mg | ORAL_TABLET | Freq: Every day | ORAL | 0 refills | Status: DC

## 2016-12-04 MED ORDER — ESOMEPRAZOLE MAGNESIUM 40 MG PO CPDR
40.0000 mg | DELAYED_RELEASE_CAPSULE | Freq: Two times a day (BID) | ORAL | 0 refills | Status: DC
Start: 2016-12-04 — End: 2016-12-04

## 2016-12-04 MED ORDER — ALUM & MAG HYDROXIDE-SIMETH 200-200-20 MG/5ML PO SUSP
30.0000 mL | ORAL | 0 refills | Status: DC | PRN
Start: 2016-12-04 — End: 2016-12-04

## 2016-12-04 NOTE — Progress Notes (Signed)
The patient has been given discharge instructions along with a new medication list and what to take today. The patient will be discharging with husband via car.   Saddie Benders RN

## 2016-12-04 NOTE — Progress Notes (Signed)
The patient went into observation status yesterday and wishes to wait and take all medications after discharging home. "My insurance may not pay for my medications now." I educated the patient about taking her medication now/today. I will continue to monitor the patient closely.   Saddie Benders RN

## 2016-12-04 NOTE — Progress Notes (Signed)
Progress Note   Subjective  Patient had EGD yesterday showing possible esophageal candidiasis. Started empirically on fluconazole overnight. She reports feeling better today, pain resolved, tolerating a diet. She wants to go home.   Objective   Vital signs in last 24 hours: Temp:  [98.5 F (36.9 C)-98.9 F (37.2 C)] 98.9 F (37.2 C) (05/25 2011) Pulse Rate:  [87-94] 94 (05/25 2011) Resp:  [16-25] 18 (05/25 2011) BP: (112-145)/(51-77) 112/51 (05/25 2011) SpO2:  [94 %-99 %] 94 % (05/25 2011) Last BM Date: 12/01/16 General:    white female in NAD Heart:  Regular rate and rhythm; no murmurs Lungs: Respirations even and unlabored, lungs CTA bilaterally Abdomen:  Soft, nontender .  Extremities:  Without edema. Neurologic:  Alert and oriented,  grossly normal neurologically. Psych:  Cooperative. Normal mood and affect.  Intake/Output from previous day: 05/25 0701 - 05/26 0700 In: 340 [P.O.:240; IV Piggyback:100] Out: -  Intake/Output this shift: No intake/output data recorded.  Lab Results:  Recent Labs  12/02/16 1643 12/02/16 2323 12/03/16 0258  WBC 9.2  --  8.3  HGB 12.8 10.2* 10.6*  HCT 38.6 30.0* 33.2*  PLT 336  --  246   BMET  Recent Labs  12/02/16 1643 12/02/16 2323 12/03/16 0258 12/03/16 1554  NA 134* 136 136 134*  K 2.3* 3.0* 2.5* 4.2  CL 93* 95* 99* 102  CO2 26  --  25 22  GLUCOSE 157* 137* 126* 147*  BUN 7 6 5* <5*  CREATININE 1.00 0.80 0.82 0.72  CALCIUM 7.8*  --  6.9* 7.3*   LFT  Recent Labs  12/02/16 1643  PROT 6.2*  ALBUMIN 3.3*  AST 29  ALT 35  ALKPHOS 89  BILITOT 0.4   PT/INR No results for input(s): LABPROT, INR in the last 72 hours.  Studies/Results: Dg Chest 2 View  Result Date: 12/03/2016 CLINICAL DATA:  Chest pain, shortness of breath, and nausea for 3 weeks. History of hypertension. Lactic acidosis. EXAM: CHEST  2 VIEW COMPARISON:  10/11/2016 FINDINGS: Shallow inspiration. Mild cardiac enlargement with mild  prominence of pulmonary vascularity. Right hilum is somewhat prominent but this has been present previously, likely representing prominent vascularity. Atelectasis in the lung bases. No edema or consolidation in the lungs. No blunting of costophrenic angles. No pneumothorax. Tortuous aorta. Gastric lap band is present. Surgical clips in the right upper quadrant. IMPRESSION: Cardiac enlargement with mild pulmonary vascular congestion. No edema or consolidation. Electronically Signed   By: Lucienne Capers M.D.   On: 12/03/2016 00:12   Ct Angio Abd/pel W And/or Wo Contrast  Result Date: 12/02/2016 CLINICAL DATA:  Acute onset of generalized abdominal pain, nausea and vomiting. Elevated lactic acid. Initial encounter. EXAM: CTA ABDOMEN AND PELVIS wITHOUT AND WITH CONTRAST TECHNIQUE: Multidetector CT imaging of the abdomen and pelvis was performed using the standard protocol during bolus administration of intravenous contrast. Multiplanar reconstructed images and MIPs were obtained and reviewed to evaluate the vascular anatomy. CONTRAST:  100 mL of Isovue 370 IV contrast COMPARISON:  MRI of the lumbar spine performed 03/29/2015 FINDINGS: VASCULAR Aorta: Scattered calcification is seen along the abdominal aorta and branches. No significant luminal narrowing is seen. There is no evidence of aortic dissection. There is no evidence of aneurysmal dilatation. Celiac: The celiac trunk appears intact, with mild calcification at the origin of the celiac trunk. SMA: The superior mesenteric artery remains patent, with scattered calcification at the origin of the superior mesenteric artery. Renals: There is  likely moderate to severe luminal narrowing at the origin of the left renal artery. Would correlate clinically for refractory hypertension. Scattered calcification is seen at the origins of both renal arteries. IMA: The inferior mesenteric artery remains grossly patent. Mild calcification is seen at the origin of the inferior  mesenteric artery. Inflow: Mild calcification is seen along the internal iliac arteries bilaterally. The common and external iliac arteries, and common femoral arteries, are grossly unremarkable in appearance. Proximal Outflow: The proximal branches of the common femoral arteries appear grossly intact bilaterally. Veins: Visualized venous structures are grossly unremarkable. The inferior vena cava is unremarkable in appearance. Review of the MIP images confirms the above findings. NON-VASCULAR Lower chest: Scattered coronary artery calcifications are seen. Right basilar scarring is noted. The patient's gastric banding is grossly unremarkable in appearance. Hepatobiliary: The liver is unremarkable in appearance. The patient is status post cholecystectomy, with clips noted at the gallbladder fossa. The common bile duct remains normal in caliber. Pancreas: The pancreas is within normal limits. Spleen: The spleen is unremarkable in appearance. Adrenals/Urinary Tract: The adrenal glands are unremarkable in appearance. A small right renal cyst is noted. Nonspecific perinephric stranding is noted bilaterally. There is no evidence of hydronephrosis. No renal or ureteral stones are identified. Stomach/Bowel: The stomach is unremarkable in appearance. The small bowel is within normal limits. The appendix is not visualized; there is no evidence for appendicitis. Scattered diverticulosis is noted along the proximal sigmoid colon, without evidence of diverticulitis. Lymphatic: No retroperitoneal or pelvic sidewall lymphadenopathy is seen. Reproductive: The bladder is mildly distended and grossly remarkable. The uterus is unremarkable in appearance. The ovaries are grossly symmetric. No suspicious adnexal masses are seen. Other: No additional soft tissue abnormalities are seen. Musculoskeletal: No acute osseous abnormalities are identified. Facet disease is noted at the lower lumbar spine. The visualized musculature is  unremarkable in appearance. IMPRESSION: VASCULAR 1. Scattered aortic atherosclerosis. 2. Likely moderate to severe luminal narrowing at the origin of the left renal artery. Would correlate clinically for refractory hypertension, and consider treatment as deemed clinically appropriate. 3. Scattered coronary artery calcifications seen. NON-VASCULAR 1. Right basilar scarring noted. Visualized lung bases otherwise clear. 2. Small right renal cyst. 3. Scattered diverticulosis along the proximal sigmoid colon, without evidence of diverticulitis. 4. Gastric banding is grossly unremarkable in appearance. Electronically Signed   By: Garald Balding M.D.   On: 12/02/2016 20:14       Assessment / Plan:   66 y/o female presenting with nonspecific upper abdominal discomfort / chest discomfort - underwent workup with CT which did not show a cause, EGD suggested esophageal candidiasis. Treated with fluconazole yesterday, symptoms considerably improved and tolerating diet. She wants to go home. Her prednisone use may be her risk factor to have developed this. Would use lowest dose possible of steroid moving forward for her RA.  I think she is stable for discharge home. Recommend fluconazole 200mg  daily for another 2 weeks to treat fluconazole. She can use liquid carafate 10cc po q 6 hours as needed for chest discomfort / odynophagia.   She can follow up with Dr. Benson Norway as needed moving forward.   [place] Cellar, MD [facility] Pager [phone]

## 2016-12-07 NOTE — Anesthesia Postprocedure Evaluation (Signed)
Anesthesia Post Note  Patient: [name]  Procedure(s) Performed: Procedure(s) (LRB): ESOPHAGOGASTRODUODENOSCOPY (EGD) (N/A)  Patient location during evaluation: PACU Anesthesia Type: MAC Level of consciousness: awake and alert Pain management: pain level controlled Vital Signs Assessment: post-procedure vital signs reviewed and stable Respiratory status: spontaneous breathing, nonlabored ventilation, respiratory function stable and patient connected to nasal cannula oxygen Cardiovascular status: stable and blood pressure returned to baseline Anesthetic complications: no       Last Vitals:  Vitals:   12/03/16 1500 12/03/16 2011  BP: 134/66 (!) 112/51  Pulse: 87 94  Resp: 19 18  Temp:  37.2 C    Last Pain:  Vitals:   12/04/16 0747  TempSrc:   PainSc: 0-No pain                 Montez Hageman

## 2016-12-07 NOTE — Discharge Summary (Signed)
[facility] Discharge Summary   Patient: Cindy Holmes NFA:[id number]   PCP: Redmond School, MD DOB: 16-Apr-1951   Date of admission: 12/02/2016   Date of discharge: 12/04/2016    Discharge Diagnoses:  Principal Problem:   Hypokalemia Active Problems:   Essential hypertension   Rheumatoid arthritis ([place])   Coronary artery disease involving native coronary artery of native heart without angina pectoris   Lactic acidosis   AKI (acute kidney injury) ([place])   Chronic diastolic CHF (congestive heart failure) ([place])   Candida esophagitis ([place])  Admitted From: home Disposition:  home  Recommendations for Outpatient Follow-up:  1. Please follow up with PCP in 1 week and gastroenterology as needed   Follow-up Information    Redmond School, MD. Schedule an appointment as soon as possible for a visit in 1 week(s).   Specialty:  Internal Medicine Contact information: [address] [place] [address] [phone]        Carol Ada, MD. Call.   Specialty:  Gastroenterology Why:  as needed.  Contact information: [place], [place] [address] [phone]          Diet recommendation: soft diet  Activity: The patient is advised to gradually reintroduce usual activities.  Discharge Condition: good  Code Status: full code  History of present illness: As per the H and P dictated on admission, " Cindy Holmes is a 66 y.o. female with a past medical history significant for CAD s/p PCI 2001, RA on prednisone, leflunomide, Xeljanz, HFpEF, hx of Lap band 8 years ago and remote stroke who presents with epigastric pain for 3 weeks.  The patient first developed epigastric pain, aching in character, worse with eating or sitting forward, non-exertional about three weeks ago.  This has been slowly progressive over the last few weeks to the point that now she can only eat liquids (solids make it worse).  Now she is starting to get weak, severe  fatigue, dizziness with exertion and persistent nausea.  She made a referral to GI, and plans to have an EGD with Dr. Benson Norway next Thursday (last dose Plavix was Weds/yesterday), but tonight she couldn't stand waiting on the pain anymore so she came to the ER.  She has had no fever, chills, cough, sputum production, dyspena, dysuria, urinary frequency/urgency."  Hospital Course:  Summary of her active problems in the hospital is as following. 1. Candida esophagitis Patient presents with complains of 3 weeks of epigastric chest pain progressively worsening, associated more with solid food. Patient was seen by GI as an outpatient and was recommended to undergo EGD which was scheduled on next Thursday. For last 2 days the pain has progressively worsened and the patient was more and more lethargic and therefore was brought to the hospital. GI was consulted  patient has been off of her Plavix since Tuesday. Pt was taken for EGD, that shows presence of esophagitis likely due to Candida infection. This is likely due to her being on RA medication with chronic steroids. Currently started on IV fluconazole, continue Protonix continue Carafate. Started back on regular diet and was tolerating it.  Switched to oral fluconazole.  2. Lactic acidosis, dehydration. Patient presented with lactic acid level of 3.1 which was worsening to 3.99. At present I do not suspect that the patient has an ongoing active sepsis and this appears to be presentation with dehydration. Patient has been given IV hydration, recommended oral hydration  3. Chest pain. History of CAD Troponins and EKGs are more  consistent with ACS, doesn't patient is also not consistent with ACS. Echocardiogram has been performed shows EF of 48-54%, grade 2 diastolic dysfunction, no wall motion abnormalities. Continue aspirin, resume Plavix 75 mg, continue Lipitor 20 as well. Lasix is currently on hold will switch to PRN on discharge Continue  metoprolol  4. Hypokalemia. Likely due to poor mouth intake. Replaced and resolved  5. Anemia, presenting hemoglobin with 12.8, currently it is 10.2, stable. No active bleeding reported, no evidence of bleeding seen on the EGD as well. At present I suspect this is dilutional  All other chronic medical condition were stable during the hospitalization.  Patient was ambulatory without any assistance. On the day of the discharge the patient's vitals were stable, and no other acute medical condition were reported by patient. the patient was felt safe to be discharge at home with family.  Procedures and Results:  EGD  - Possible monilial esophagitis. Biopsied. - Normal stomach. - Normal examined duodenum. - Emperic treatment with fluconazole 200 mg IV.  Consultations:  Gastroenterology  DISCHARGE MEDICATION: Discharge Medication List as of 12/04/2016  8:10 AM    START taking these medications   Details  alum & mag hydroxide-simeth (MAALOX/MYLANTA) 200-200-20 MG/5ML suspension Take 30 mLs by mouth every 4 (four) hours as needed for indigestion or heartburn., Starting Sat 12/04/2016, Normal    fluconazole (DIFLUCAN) 200 MG tablet Take 1 tablet (200 mg total) by mouth daily., Starting Sat 12/04/2016, Until Sat 12/18/2016, Normal    sucralfate (CARAFATE) 1 GM/10ML suspension Take 10 mLs (1 g total) by mouth 4 (four) times daily -  with meals and at bedtime., Starting Sat 12/04/2016, Until Fri 12/17/2016, Normal      CONTINUE these medications which have CHANGED   Details  esomeprazole (NEXIUM) 40 MG capsule Take 1 capsule (40 mg total) by mouth 2 (two) times daily before a meal. For acid reflux, Starting Sat 12/04/2016, Until Fri 12/17/2016, Normal    furosemide (LASIX) 40 MG tablet Take 1 tablet (40 mg total) by mouth daily as needed. for weight gain of 3 Lbs in 1 day, Starting Sat 12/04/2016, Until Fri 03/04/2017, Normal      CONTINUE these medications which have NOT CHANGED   Details    allopurinol (ZYLOPRIM) 300 MG tablet Take 300 mg by mouth daily., Starting Tue 01/07/2015, Historical Med    aspirin 81 MG tablet Take 81 mg by mouth daily.  , Historical Med    atorvastatin (LIPITOR) 40 MG tablet Take 2 tablets (80 mg total) by mouth daily., Starting Wed 09/08/2016, Until Tue 12/07/2016, Normal    clopidogrel (PLAVIX) 75 MG tablet TAKE 1 TABLET BY MOUTH  EVERY DAY, Normal    desloratadine (CLARINEX) 5 MG tablet Take 5 mg by mouth daily., Historical Med    diazepam (VALIUM) 5 MG tablet Take 1 tablet by mouth every 8 (eight) hours as needed for anxiety. , Starting Mon 02/25/2014, Historical Med    ezetimibe (ZETIA) 10 MG tablet Take 1 tablet (10 mg total) by mouth daily., Starting Fri 07/30/2016, Until Fri 01/05/349, Normal    folic acid (FOLVITE) 1 MG tablet Take 1 mg by mouth daily. Takes with chemo medication., Starting Tue 03/05/2014, Historical Med    gabapentin (NEURONTIN) 300 MG capsule Take 1 capsule (300 mg total) by mouth 3 (three) times daily., Starting Thu 06/27/2014, Normal    leflunomide (ARAVA) 20 MG tablet Take 20 mg by mouth daily., Historical Med    metoprolol (LOPRESSOR) 50 MG tablet take 1  tablet by mouth once daily, Normal    nitroGLYCERIN (NITROSTAT) 0.4 MG SL tablet Place 1 tablet (0.4 mg total) under the tongue every 5 (five) minutes as needed for chest pain., Starting Mon 06/02/2015, Normal    Omega-3 Fatty Acids (FISH OIL) 1000 MG CAPS Take 1 capsule by mouth daily. , Historical Med    predniSONE (DELTASONE) 5 MG tablet Take 10 mg by mouth daily with breakfast. , Historical Med    ramipril (ALTACE) 2.5 MG capsule Take 1 capsule (2.5 mg total) by mouth daily., Starting Mon 06/21/2016, Normal    sennosides-docusate sodium (SENOKOT-S) 8.6-50 MG tablet Take 2 tablets by mouth daily., Starting Fri 11/16/2013, Normal    zolpidem (AMBIEN) 5 MG tablet Take 5 mg by mouth at bedtime., Starting Fri 12/13/2014, Historical Med       Allergies  Allergen  Reactions  . Codeine Phosphate Shortness Of Breath    REACTION: unspecified  . Amoxicillin Nausea And Vomiting    REACTION: unspecified  . Penicillins Nausea And Vomiting    Has patient had a PCN reaction causing immediate rash, facial/tongue/throat swelling, SOB or lightheadedness with hypotension:YES Has patient had a PCN reaction causing severe rash involving mucus membranes or skin necrosis: NO Has patient had a PCN reaction that required hospitalization NO Has patient had a PCN reaction occurring within the last 10 years: NO If all of the above answers are "NO", then may proceed with Cephalosporin use.  . Lidocaine Rash    Patch caused rash   Discharge Instructions    Diet - low sodium heart healthy    Complete by:  As directed    Discharge instructions    Complete by:  As directed    It is important that you read following instructions as well as go over your medication list with RN to help you understand your care after this hospitalization.  Discharge Instructions: Please follow-up with PCP in one week  Please request your primary care physician to go over all Hospital Tests and Procedure/Radiological results at the follow up,  Please get all Hospital records sent to your PCP by signing hospital release before you go home.   Do not take more than prescribed Pain, Sleep and Anxiety Medications. You were cared for by a hospitalist during your hospital stay. If you have any questions about your discharge medications or the care you received while you were in the hospital after you are discharged, you can call the unit and ask to speak with the hospitalist on call if the hospitalist that took care of you is not available.  Once you are discharged, your primary care physician will handle any further medical issues. Please note that NO REFILLS for any discharge medications will be authorized once you are discharged, as it is imperative that you return to your primary care physician  (or establish a relationship with a primary care physician if you do not have one) for your aftercare needs so that they can reassess your need for medications and monitor your lab values. You Must read complete instructions/literature along with all the possible adverse reactions/side effects for all the Medicines you take and that have been prescribed to you. Take any new Medicines after you have completely understood and accept all the possible adverse reactions/side effects. Wear Seat belts while driving. If you have smoked or chewed Tobacco in the last 2 yrs please stop smoking and/or stop any Recreational drug use.   Increase activity slowly    Complete by:  As directed      Discharge Exam: Filed Weights   12/02/16 1623 12/03/16 0200  Weight: 99.8 kg (220 lb) 108.5 kg (239 lb 4.8 oz)   Vitals:   12/03/16 1500 12/03/16 2011  BP: 134/66 (!) 112/51  Pulse: 87 94  Resp: 19 18  Temp:  98.9 F (37.2 C)   General: Appear in no distress, no Rash; Oral Mucosa moist. Cardiovascular: S1 and S2 Present, nop Murmur, no JVD Respiratory: Bilateral Air entry present and Clear to Auscultation, no Crackles, no wheezes Abdomen: Bowel Sound present, Soft and no tenderness Extremities: no Pedal edema, no calf tenderness Neurology: Grossly no focal neuro deficit.  The results of significant diagnostics from this hospitalization (including imaging, microbiology, ancillary and laboratory) are listed below for reference.    Significant Diagnostic Studies: Dg Chest 2 View  Result Date: 12/03/2016 CLINICAL DATA:  Chest pain, shortness of breath, and nausea for 3 weeks. History of hypertension. Lactic acidosis. EXAM: CHEST  2 VIEW COMPARISON:  10/11/2016 FINDINGS: Shallow inspiration. Mild cardiac enlargement with mild prominence of pulmonary vascularity. Right hilum is somewhat prominent but this has been present previously, likely representing prominent vascularity. Atelectasis in the lung bases. No  edema or consolidation in the lungs. No blunting of costophrenic angles. No pneumothorax. Tortuous aorta. Gastric lap band is present. Surgical clips in the right upper quadrant. IMPRESSION: Cardiac enlargement with mild pulmonary vascular congestion. No edema or consolidation. Electronically Signed   By: Lucienne Capers M.D.   On: 12/03/2016 00:12   Ct Angio Abd/pel W And/or Wo Contrast  Result Date: 12/02/2016 CLINICAL DATA:  Acute onset of generalized abdominal pain, nausea and vomiting. Elevated lactic acid. Initial encounter. EXAM: CTA ABDOMEN AND PELVIS wITHOUT AND WITH CONTRAST TECHNIQUE: Multidetector CT imaging of the abdomen and pelvis was performed using the standard protocol during bolus administration of intravenous contrast. Multiplanar reconstructed images and MIPs were obtained and reviewed to evaluate the vascular anatomy. CONTRAST:  100 mL of Isovue 370 IV contrast COMPARISON:  MRI of the lumbar spine performed 03/29/2015 FINDINGS: VASCULAR Aorta: Scattered calcification is seen along the abdominal aorta and branches. No significant luminal narrowing is seen. There is no evidence of aortic dissection. There is no evidence of aneurysmal dilatation. Celiac: The celiac trunk appears intact, with mild calcification at the origin of the celiac trunk. SMA: The superior mesenteric artery remains patent, with scattered calcification at the origin of the superior mesenteric artery. Renals: There is likely moderate to severe luminal narrowing at the origin of the left renal artery. Would correlate clinically for refractory hypertension. Scattered calcification is seen at the origins of both renal arteries. IMA: The inferior mesenteric artery remains grossly patent. Mild calcification is seen at the origin of the inferior mesenteric artery. Inflow: Mild calcification is seen along the internal iliac arteries bilaterally. The common and external iliac arteries, and common femoral arteries, are grossly  unremarkable in appearance. Proximal Outflow: The proximal branches of the common femoral arteries appear grossly intact bilaterally. Veins: Visualized venous structures are grossly unremarkable. The inferior vena cava is unremarkable in appearance. Review of the MIP images confirms the above findings. NON-VASCULAR Lower chest: Scattered coronary artery calcifications are seen. Right basilar scarring is noted. The patient's gastric banding is grossly unremarkable in appearance. Hepatobiliary: The liver is unremarkable in appearance. The patient is status post cholecystectomy, with clips noted at the gallbladder fossa. The common bile duct remains normal in caliber. Pancreas: The pancreas is within normal limits. Spleen: The spleen is  unremarkable in appearance. Adrenals/Urinary Tract: The adrenal glands are unremarkable in appearance. A small right renal cyst is noted. Nonspecific perinephric stranding is noted bilaterally. There is no evidence of hydronephrosis. No renal or ureteral stones are identified. Stomach/Bowel: The stomach is unremarkable in appearance. The small bowel is within normal limits. The appendix is not visualized; there is no evidence for appendicitis. Scattered diverticulosis is noted along the proximal sigmoid colon, without evidence of diverticulitis. Lymphatic: No retroperitoneal or pelvic sidewall lymphadenopathy is seen. Reproductive: The bladder is mildly distended and grossly remarkable. The uterus is unremarkable in appearance. The ovaries are grossly symmetric. No suspicious adnexal masses are seen. Other: No additional soft tissue abnormalities are seen. Musculoskeletal: No acute osseous abnormalities are identified. Facet disease is noted at the lower lumbar spine. The visualized musculature is unremarkable in appearance. IMPRESSION: VASCULAR 1. Scattered aortic atherosclerosis. 2. Likely moderate to severe luminal narrowing at the origin of the left renal artery. Would correlate  clinically for refractory hypertension, and consider treatment as deemed clinically appropriate. 3. Scattered coronary artery calcifications seen. NON-VASCULAR 1. Right basilar scarring noted. Visualized lung bases otherwise clear. 2. Small right renal cyst. 3. Scattered diverticulosis along the proximal sigmoid colon, without evidence of diverticulitis. 4. Gastric banding is grossly unremarkable in appearance. Electronically Signed   By: Garald Balding M.D.   On: 12/02/2016 20:14    Microbiology: Recent Results (from the past 240 hour(s))  Culture, blood (routine x 2)     Status: None (Preliminary result)   Collection Time: 12/03/16  2:57 AM  Result Value Ref Range Status   Specimen Description BLOOD RIGHT WRIST  Final   Special Requests   Final    BOTTLES DRAWN AEROBIC AND ANAEROBIC Blood Culture adequate volume   Culture NO GROWTH 3 DAYS  Final   Report Status PENDING  Incomplete     Labs: CBC:  Recent Labs Lab 12/02/16 1643 12/02/16 2323 12/03/16 0258  WBC 9.2  --  8.3  HGB 12.8 10.2* 10.6*  HCT 38.6 30.0* 33.2*  MCV 88.7  --  90.0  PLT 336  --  767   Basic Metabolic Panel:  Recent Labs Lab 12/02/16 1643 12/02/16 2323 12/03/16 0258 12/03/16 1554 12/04/16 0743  NA 134* 136 136 134* 134*  K 2.3* 3.0* 2.5* 4.2 3.5  CL 93* 95* 99* 102 101  CO2 26  --  25 22 23   GLUCOSE 157* 137* 126* 147* 129*  BUN 7 6 5* <5* 6  CREATININE 1.00 0.80 0.82 0.72 0.74  CALCIUM 7.8*  --  6.9* 7.3* 8.1*  MG  --   --  1.5* 2.1  --    Liver Function Tests:  Recent Labs Lab 12/02/16 1643  AST 29  ALT 35  ALKPHOS 89  BILITOT 0.4  PROT 6.2*  ALBUMIN 3.3*    Recent Labs Lab 12/02/16 1643  LIPASE 31   No results for input(s): AMMONIA in the last 168 hours. Cardiac Enzymes: No results for input(s): CKTOTAL, CKMB, CKMBINDEX, TROPONINI in the last 168 hours. BNP (last 3 results)  Recent Labs  12/03/16 0258  BNP 106.2*   CBG: No results for input(s): GLUCAP in the last 168  hours. Time spent: 35 minutes  Signed:  Gennavieve Huq  [facility] 12/04/2016 , 8:03 AM

## 2016-12-08 LAB — CULTURE, BLOOD (ROUTINE X 2)
Culture: NO GROWTH
Special Requests: ADEQUATE

## 2017-01-10 ENCOUNTER — Encounter: Payer: Self-pay | Admitting: Physician Assistant

## 2017-01-10 DIAGNOSIS — R112 Nausea with vomiting, unspecified: Secondary | ICD-10-CM | POA: Diagnosis not present

## 2017-01-10 DIAGNOSIS — B3781 Candidal esophagitis: Secondary | ICD-10-CM | POA: Diagnosis not present

## 2017-01-10 DIAGNOSIS — Z1211 Encounter for screening for malignant neoplasm of colon: Secondary | ICD-10-CM | POA: Diagnosis not present

## 2017-01-10 NOTE — Progress Notes (Signed)
Cardiology Office Note    Date:  01/11/2017  ID:  Cindy Holmes, DOB [birth year], MRN [id number] PCP:  Redmond School, MD  Cardiologist:  Dr. Meda Coffee   Chief Complaint: f/u CHF, CAD  History of Present Illness:  Cindy Holmes is a 66 y.o. female with history of CAD (s/p BMS to prox and mid LAD as well as PTCA of D1 in 2001 with repeat PTCA to ostial D1 in 2002), HTN, HLD, morbid obesity, chronic diastolic CHF, RA, possible prior stroke/mini stroke, recently diagnosed candida esophagitis for f/u of chronic cardiac conditions.  Last cath 03/2016 showed moderate in-stent restenosis of proximal LAD stent, which is not hemodynamically significant (FFR 0.84), mild disease involving ostial D1 and mid/distal LAD stent, normal LV contraction with upper normal LV filling pressure. 2D echo 11/2016 showed EF 65-70%, grade 2 DD, mildly dilated LA. In 11/2016 she was admitted for epigastric pain with progressive dysphagia, weakness, fatigue, dizziness and nausea. Workup revealed candida esophagitis, lactic acidosis, severe hypokalemia, and anemia felt dilutional. Last labs 11/2016 showed Na 134, K 3.5 (as low as 2.3), BUN 6, Cr 0.74, Mg 2.1 (as low as 1.5), Hgb 10.6 (down from previous in the 12-13 range), BNP 106. Last lipids 09/2016 showed trig 110, HDL 74, LDL 68 (back on atorvastatin/Zetia per lipid clinic).  She presents back to clinic today feeling much better. She states it was a really rough go after d/c from the hospital because she was given lots of IV fluids and as a result had a lot of swelling for several weeks. It is finally improving however. She reports being told her CT scan showed narrowing of her kidney artery and she is extremely concerned. In general she is skeptical of all medical care she has received both by PCP and inpatient because she feels like doctors have missed things in the past that they shouldn't have missed. No chest pain or dyspnea. She states she is drinking lots of water to try  to mobilize the remaining edema.   Past Medical History:  Diagnosis Date  . CAD (coronary artery disease)    a. PTCA LAD 2001/cath 2002-prox and mid LAD stents patent, PTCA ostium Dx 1 b. cath 03/2016: patent LAD stent with less than 40% in-stent restenosis, 10-30% stenosis of D1 and distal LAD with continued medical management recommended.  . Candida esophagitis ([place])   . Carotid artery disease ([place])    Doppler September, 2011, 0-39% bilateral disease mild  . Chronic diastolic CHF (congestive heart failure) ([place])   . Edema   . Emotional stress reaction    8/11  . HTN (hypertension)   . Hyperlipidemia   . Leg pain    July, 2012, Arterial Dopplers March 08, 2011 normal  . 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 PONV (postoperative nausea and vomiting)   . Rheumatoid arthritis ([place])   . Right rotator cuff tear 11/16/2013  . Stroke [facility])    mini stroke in past ???    Past Surgical History:  Procedure Laterality Date  . CARDIAC CATHETERIZATION     with stent placement in 2001  . CARDIAC CATHETERIZATION N/A 03/12/2016   Procedure: Left Heart Cath and Coronary Angiography;  Surgeon: Nelva Bush, MD;  Location: [place] CV LAB;  Service: Cardiovascular;  Laterality: N/A;  . CARDIAC CATHETERIZATION N/A 03/12/2016   Procedure: Intravascular Pressure Wire/FFR Study;  Surgeon:  Nelva Bush, MD;  Location: [place] CV LAB;  Service: Cardiovascular;  Laterality: N/A;  . CHOLECYSTECTOMY    . CORONARY ANGIOPLASTY     2002  . ESOPHAGOGASTRODUODENOSCOPY N/A 12/03/2016   Procedure: ESOPHAGOGASTRODUODENOSCOPY (EGD);  Surgeon: Carol Ada, MD;  Location: [place];  Service: Endoscopy;  Laterality: N/A;  . laproscopic adjustable gastric  banding     with APS standard system.   . Pt has 3 stents     sept 2001  . SHOULDER ARTHROSCOPY WITH SUBACROMIAL DECOMPRESSION,  ROTATOR CUFF REPAIR AND BICEP TENDON REPAIR Right 11/16/2013   Procedure: RIGHT SHOULDER ARTHROSCOPY, EXTENSIVE DEBRIDEMENT, ROTATOR CUFF REPAIR ;  Surgeon: [name], MD;  Location: [facility];  Service: Orthopedics;  Laterality: Right;  . TUBAL LIGATION      Current Medications: Current Meds  Medication Sig  . allopurinol (ZYLOPRIM) 300 MG tablet Take 300 mg by mouth daily.  Marland Kitchen alum & mag hydroxide-simeth (MAALOX/MYLANTA) 200-200-20 MG/5ML suspension Take 30 mLs by mouth every 4 (four) hours as needed for indigestion or heartburn.  Marland Kitchen aspirin 81 MG tablet Take 81 mg by mouth daily.    . clopidogrel (PLAVIX) 75 MG tablet TAKE 1 TABLET BY MOUTH  EVERY DAY  . desloratadine (CLARINEX) 5 MG tablet Take 5 mg by mouth daily.  . diazepam (VALIUM) 5 MG tablet Take 1 tablet by mouth every 8 (eight) hours as needed for anxiety.   . folic acid (FOLVITE) 1 MG tablet Take 1 mg by mouth daily. Takes with chemo medication.  . furosemide (LASIX) 40 MG tablet Take 1 tablet (40 mg total) by mouth daily as needed. for weight gain of 3 Lbs in 1 day  . gabapentin (NEURONTIN) 300 MG capsule Take 300 mg by mouth 3 (three) times daily as needed (pain).  Marland Kitchen leflunomide (ARAVA) 20 MG tablet Take 20 mg by mouth daily.  . metoprolol (LOPRESSOR) 50 MG tablet take 1 tablet by mouth once daily  . nitroGLYCERIN (NITROSTAT) 0.4 MG SL tablet Place 1 tablet (0.4 mg total) under the tongue every 5 (five) minutes as needed for chest pain.  . Omega-3 Fatty Acids (FISH OIL) 1000 MG CAPS Take 1 capsule by mouth daily.   . predniSONE (DELTASONE) 5 MG tablet Take 10 mg by mouth daily with breakfast.   . ramipril (ALTACE) 2.5 MG capsule Take 1 capsule (2.5 mg total) by mouth daily.  Marland Kitchen senna-docusate (SENOKOT-S) 8.6-50 MG tablet Take 1 tablet by mouth daily as needed for mild constipation.  Marland Kitchen zolpidem (AMBIEN) 5 MG tablet Take 5 mg by mouth at bedtime.     Allergies:   Codeine phosphate; Amoxicillin; Penicillins;  and Lidocaine   Social History   Social History  . Marital status: Married    Spouse name: N/A  . Number of children: N/A  . Years of education: N/A   Social History Main Topics  . Smoking status: Never Smoker  . Smokeless tobacco: Never Used  . Alcohol use No  . Drug use: No  . Sexual activity: No   Other Topics Concern  . None   Social History Narrative  . None     Family History:  Family History  Problem Relation Age of Onset  . Heart attack Mother   . Heart disease Mother   . Diabetes Brother   . Heart disease Brother   . Diabetes Sister   . Arthritis/Rheumatoid Father   . Other Father        hepatic cirrhosis  .  Heart disease Maternal Aunt   . Cancer Maternal Aunt        throat  . Heart attack Maternal Aunt   . Heart attack Maternal Uncle   . Heart disease Brother   . Cancer Unknown        family history  . Heart attack Unknown        family history  . Cancer Maternal Grandmother        colon  . Cancer Maternal Grandfather        throat    ROS:   Please see the history of present illness.  All other systems are reviewed and otherwise negative.    PHYSICAL EXAM:   VS:  BP 116/60   Pulse 72   Ht 5\' 3"  (1.6 m)   Wt 231 lb 6.4 oz (105 kg)   BMI 40.99 kg/m   BMI: Body mass index is 40.99 kg/m. GEN: Well nourished, well developed obese WF, in no acute distress  HEENT: normocephalic, atraumatic Neck: no JVD, carotid bruits, or masses Cardiac: RRR; no murmurs, rubs, or gallops, trace pedal edema superimposed on large baseline leg habitus Respiratory:  clear to auscultation bilaterally, normal work of breathing GI: soft, nontender, nondistended, + BS MS: no deformity or atrophy  Skin: warm and dry, no rash Neuro:  Alert and Oriented x 3, Strength and sensation are intact, follows commands Psych: euthymic mood, full affect  Wt Readings from Last 3 Encounters:  01/11/17 231 lb 6.4 oz (105 kg)  12/03/16 239 lb 4.8 oz (108.5 kg)  10/11/16 220 lb  (99.8 kg)      Studies/Labs Reviewed:   EKG:  EKG was ordered today but patient refused. EKG from 12/02/16 reviewed, showed NSR 85bpm with nonspecific ST-T changes with scooped ST depression inferiorly as well as V4-V6 slightly accentuated form prior, occasional ectopy  Recent Labs: 03/11/2016: TSH 2.398 12/02/2016: ALT 35 12/03/2016: B Natriuretic Peptide 106.2; Hemoglobin 10.6; Magnesium 2.1; Platelets 246 12/04/2016: BUN 6; Creatinine, Ser 0.74; Potassium 3.5; Sodium 134   Lipid Panel    Component Value Date/Time   CHOL 164 09/30/2016 0840   TRIG 110 09/30/2016 0840   HDL 74 09/30/2016 0840   CHOLHDL 2.2 09/30/2016 0840   CHOLHDL 2.3 03/12/2016 0252   VLDL 30 03/12/2016 0252   LDLCALC 68 09/30/2016 0840    Additional studies/ records that were reviewed today include: Summarized above.    ASSESSMENT & PLAN:   1. Chronic diastolic CHF - she reports weight gain and edema that occurred after recent hospitalization when she received lots of IV fluids. She says at her max her weight coming out of the hospital was 250lb. She is gradually seeing improvement and is down to 231lb. Prior weight 225lb. Will continue Lasix daily rather than PRN for now, with 1 extra tablet daily as needed for worsening edema (she reports this is what she was previously told to do). Discussed 2g sodium, 2L fluid restriction, also gave information on daily weights. Will recheck BMET and Mg today given recent issues with low levels. 2. CAD - no recent anginal sx. Anemia noted on recent hospitalization, but may be dilutional. Recheck CBC today. Continue DAPT; ultimate duration at the discretion of primary cardiologist (may also be on this for hx of TIA/stroke). Of note she is on Lopressor but only once a day. Discussed options of cutting in half and taking BID or changing to Toprol. She wishes to investigate the cost on her own before making changes. 3. Hyperlipidemia -  consider recheck labs when she returns for f/u  in 3 months. Lipids in 09/2016 looked ideal. 4. Anemia unspecified - recheck today. 5. Renal artery stenosis - noted on recent CTA. The patient is quite concerned about this and in the past relays a history of doctors "missing things." Although she has not had any recent uncontrolled BP in our office she does relay a history of labile BP outside the office as well as prior kidney issues. Recent Cr was normal. Will arrange renal artery duplex for baseline.  Disposition: F/u with Dr. Meda Coffee in 3 months.   Medication Adjustments/Labs and Tests Ordered: Current medicines are reviewed at length with the patient today.  Concerns regarding medicines are outlined above. Medication changes, Labs and Tests ordered today are summarized above and listed in the Patient Instructions accessible in Encounters.   Signed, Charlie Pitter, PA-C  01/11/2017 9:27 AM    [address], [place], [place]  [address] Phone: [phone]; Fax: [phone]

## 2017-01-11 ENCOUNTER — Encounter: Payer: Self-pay | Admitting: Physician Assistant

## 2017-01-11 ENCOUNTER — Ambulatory Visit (INDEPENDENT_AMBULATORY_CARE_PROVIDER_SITE_OTHER): Payer: Medicare Other | Admitting: Physician Assistant

## 2017-01-11 ENCOUNTER — Other Ambulatory Visit: Payer: Self-pay

## 2017-01-11 VITALS — BP 116/60 | HR 72 | Ht 63.0 in | Wt 231.4 lb

## 2017-01-11 DIAGNOSIS — D649 Anemia, unspecified: Secondary | ICD-10-CM | POA: Diagnosis not present

## 2017-01-11 DIAGNOSIS — I2510 Atherosclerotic heart disease of native coronary artery without angina pectoris: Secondary | ICD-10-CM | POA: Diagnosis not present

## 2017-01-11 DIAGNOSIS — I701 Atherosclerosis of renal artery: Secondary | ICD-10-CM

## 2017-01-11 DIAGNOSIS — I5032 Chronic diastolic (congestive) heart failure: Secondary | ICD-10-CM | POA: Diagnosis not present

## 2017-01-11 DIAGNOSIS — E785 Hyperlipidemia, unspecified: Secondary | ICD-10-CM | POA: Diagnosis not present

## 2017-01-11 MED ORDER — EZETIMIBE 10 MG PO TABS: 10.0000 mg | ORAL_TABLET | Freq: Every day | ORAL | 3 refills | Status: DC

## 2017-01-11 MED ORDER — RAMIPRIL 2.5 MG PO CAPS: 2.5000 mg | ORAL_CAPSULE | Freq: Every day | ORAL | 3 refills | Status: DC

## 2017-01-11 MED ORDER — ATORVASTATIN CALCIUM 40 MG PO TABS: 80.0000 mg | ORAL_TABLET | Freq: Every day | ORAL | 3 refills | Status: DC

## 2017-01-11 MED ORDER — NITROGLYCERIN 0.4 MG SL SUBL: 0.4000 mg | SUBLINGUAL_TABLET | SUBLINGUAL | 3 refills | Status: DC | PRN

## 2017-01-11 MED ORDER — CLOPIDOGREL BISULFATE 75 MG PO TABS: 75.0000 mg | ORAL_TABLET | Freq: Every day | ORAL | 3 refills | Status: DC

## 2017-01-11 MED ORDER — METOPROLOL TARTRATE 50 MG PO TABS: ORAL_TABLET | ORAL | 3 refills | Status: DC

## 2017-01-11 MED ORDER — EZETIMIBE 10 MG PO TABS
10.0000 mg | ORAL_TABLET | Freq: Every day | ORAL | 3 refills | Status: DC
Start: 2017-01-11 — End: 2018-05-13

## 2017-01-11 MED ORDER — FUROSEMIDE 40 MG PO TABS: ORAL_TABLET | ORAL | 3 refills | Status: DC

## 2017-01-11 NOTE — Patient Instructions (Addendum)
Medication Instructions:  Your physician recommends that you continue on your current medications as directed. Please refer to the Current Medication list given to you today.   Labwork: TODAY:  BMET, CBC, & MAGNESIUM  Testing/Procedures: Your physician has requested that you have a renal artery duplex. During this test, an ultrasound is used to evaluate blood flow to the kidneys. Allow one hour for this exam. Do not eat after midnight the day before and avoid carbonated beverages. Take your medications as you usually do.    Follow-Up: Your physician recommends that you schedule a follow-up appointment in: 3 MONTHS WITH DR. Meda Coffee   Any Other Special Instructions Will Be Listed Below (If Applicable).   For patients with congestive heart failure, we give them these special instructions:  1. Follow a low-salt diet - you are allowed no more than 2,000mg  of sodium per day. Watch your fluid intake. In general, you should not be taking in more than 2 liters of fluid per day (no more than 8 glasses per day). This includes sources of water in foods like soup, coffee, tea, milk, etc. 2. Weigh yourself on the same scale at same time of day and keep a log. 3. Call your doctor: (Anytime you feel any of the following symptoms)  - 3lb weight gain overnight or 5lb within a few days - Shortness of breath, with or without a dry hacking cough  - Swelling in the hands, feet or stomach  - If you have to sleep on extra pillows at night in order to breathe   IT IS IMPORTANT TO LET YOUR DOCTOR KNOW EARLY ON IF YOU ARE HAVING SYMPTOMS SO WE CAN HELP YOU!   Renal Artery Stenosis Renal artery stenosis (RAS) is narrowing of the artery that carries blood to your kidneys. It can affect one or both kidneys. Your kidneys filter waste and extra fluid from your blood. You get rid of the waste and fluid when you urinate. Your kidneys also make an important chemical messenger (hormone) called renin. Renin helps  regulate your blood pressure. The first sign of RAS may be high blood pressure. Over time, other symptoms can develop. What are the causes? Plaque buildup in your arteries (atherosclerosis) is the main cause of RAS. The plaques that cause this are made up of:  Fat.  Cholesterol.  Calcium.  Other substances.  As these substances build up in your renal artery, this slows the blood supply to your kidneys. The lack of blood and oxygen causes the signs and symptoms of RAS. A much less common cause of RAS is a disease called fibromuscular dysplasia. This disease causes abnormal cell growth that narrows the renal artery. It is not related to atherosclerosis. It occurs mostly in women who are 63-76 years old. It may be passed down through families. What increases the risk? You may be at risk for renal artery stenosis if you:  Are a man who is at least 66 years old.  Are a woman who is at least 66 years old.  Have high blood pressure.  Have high cholesterol.  Are a smoker.  Abuse alcohol.  Have diabetes or prediabetes.  Are overweight.  Have a family history of early heart disease.  What are the signs or symptoms? RAS usually develops slowly. You may not have any signs or symptoms at first. The earliest signs may be:  Developing high blood pressure.  A sudden increase in existing high blood pressure.  No longer responding to medicine that used  to control your blood pressure.  Later signs and symptoms are due to kidney damage. They may include:  Fatigue.  Shortness of breath.  Swollen legs and feet.  Dry skin.  Headaches.  Muscle cramps.  Loss of appetite.  Nausea or vomiting.  How is this diagnosed? Your health care provider may suspect RAS based on changes in your blood pressure and your risk factors. A physical exam will be done. Your health care provider may use a stethoscope to listen for a whooshing sound (bruit) that can occur where the renal artery is  blocking blood flow. Several tests may be done to confirm a diagnosis of RAS. These may include:  Blood and urine tests to check your kidney function.  Imaging tests of your kidneys, such as: ? A test that involves using sound waves to create an image of your kidneys and the blood flow to your kidneys (ultrasound). ? A test in which dye is injected into one of your blood vessels so images can be taken as the dye flows through your renal arteries (angiogram). These tests can be done using X-rays, a CT scan (computed tomography angiogram, CTA), or a type of MRI (magnetic resonance angiogram, MRA).  How is this treated? Making lifestyle changes to reduce your risk factors is the first treatment option for early RAS. If the blood flow to one of your kidneys is cut by more than half, you may need medicine to:  Lower your blood pressure. This is the main medical treatment for RAS. You may need more than one type of medicine for this. The two types that work best for RAS are: ? ACE inhibitors. ? Angiotensin receptor blockers.  Reduce fluid in the body (diuretics).  Lower your cholesterol (statins).  If medicine is not enough to control RAS, you may need surgery. This may involve:  Threading a tube with an inflatable balloon into the renal artery to force it open (angioplasty).  Removing plaque from inside the artery (endarterectomy).  Follow these instructions at home:  Take medicines only as directed by your health care provider.  Make any lifestyle changes recommended by your health care provider. This may include: ? Working with a dietitian to maintain a heart-healthy diet. This type of diet is low in saturated fat, salt, and added sugar. ? Starting an exercise program as directed by your health care provider. ? Maintaining a healthy weight. ? Quitting smoking. ? Not abusing alcohol.  Keep all follow-up visits as directed by your health care provider. This is important. Contact a  health care provider if:  Your symptoms of RAS are not getting better.  Your symptoms are changing or getting worse. Get help right away if:  You have very bad pain in your back or abdomen.  You have blood in your urine. This information is not intended to replace advice given to you by your health care provider. Make sure you discuss any questions you have with your health care provider. Document Released: 03/24/2005 Document Revised: 12/04/2015 Document Reviewed: 10/11/2013 Elsevier Interactive Patient Education  [employer].     If you need a refill on your cardiac medications before your next appointment, please call your pharmacy.

## 2017-01-12 LAB — BASIC METABOLIC PANEL
BUN / CREAT RATIO: 16 (ref 12–28)
BUN: 11 mg/dL (ref 8–27)
CO2: 24 mmol/L (ref 20–29)
CREATININE: 0.68 mg/dL (ref 0.57–1.00)
Calcium: 9.4 mg/dL (ref 8.7–10.3)
Chloride: 95 mmol/L — ABNORMAL LOW (ref 96–106)
GFR, EST AFRICAN AMERICAN: 106 mL/min/{1.73_m2} (ref >59)
GFR, EST NON AFRICAN AMERICAN: 92 mL/min/{1.73_m2} (ref >59)
Glucose: 134 mg/dL — ABNORMAL HIGH (ref 65–99)
POTASSIUM: 4.5 mmol/L (ref 3.5–5.2)
SODIUM: 136 mmol/L (ref 134–144)

## 2017-01-12 LAB — MAGNESIUM: Magnesium: 1.8 mg/dL (ref 1.6–2.3)

## 2017-01-12 LAB — CBC
HEMATOCRIT: 37.4 % (ref 34.0–46.6)
HEMOGLOBIN: 12.3 g/dL (ref 11.1–15.9)
MCH: 29.4 pg (ref 26.6–33.0)
MCHC: 32.9 g/dL (ref 31.5–35.7)
MCV: 89 fL (ref 79–97)
Platelets: 322 10*3/uL (ref 150–379)
RBC: 4.19 x10E6/uL (ref 3.77–5.28)
RDW: 14.9 % (ref 12.3–15.4)
WBC: 10.8 10*3/uL (ref 3.4–10.8)

## 2017-01-13 ENCOUNTER — Encounter: Payer: Self-pay | Admitting: *Deleted

## 2017-01-27 ENCOUNTER — Ambulatory Visit (HOSPITAL_COMMUNITY)
Admission: RE | Admit: 2017-01-27 | Discharge: 2017-01-27 | Disposition: A | Discharge status: Home / Self Care | Payer: Medicare Other | Source: Ambulatory Visit | Attending: Cardiology | Admitting: Cardiology

## 2017-01-27 DIAGNOSIS — I701 Atherosclerosis of renal artery: Secondary | ICD-10-CM | POA: Diagnosis not present

## 2017-02-01 ENCOUNTER — Encounter: Payer: Self-pay | Admitting: *Deleted

## 2017-02-07 ENCOUNTER — Telehealth: Payer: Self-pay | Admitting: Cardiology

## 2017-02-07 NOTE — Telephone Encounter (Signed)
New message     Received letter stating that Anderson Malta tried to reach you and no one has called her

## 2017-02-07 NOTE — Telephone Encounter (Signed)
Notes recorded by Charlie Pitter, PA-C on 01/31/2017 at 7:46 AM EDT Please let patient know renal artery duplex was normal. No evidence of any significant kidney artery narrowing or blood flow obstruction in kidneys. Dayna Dunn PA-C  Pt aware of renal artery [place] results per [facility] Dunn PA-C.  Pt verbalized understanding.

## 2017-02-10 DIAGNOSIS — Z6841 Body Mass Index (BMI) 40.0 and over, adult: Secondary | ICD-10-CM | POA: Diagnosis not present

## 2017-02-10 DIAGNOSIS — M0689 Other specified rheumatoid arthritis, multiple sites: Secondary | ICD-10-CM | POA: Diagnosis not present

## 2017-02-10 DIAGNOSIS — B352 Tinea manuum: Secondary | ICD-10-CM | POA: Diagnosis not present

## 2017-02-10 DIAGNOSIS — E748 Other specified disorders of carbohydrate metabolism: Secondary | ICD-10-CM | POA: Diagnosis not present

## 2017-02-10 DIAGNOSIS — Z1389 Encounter for screening for other disorder: Secondary | ICD-10-CM | POA: Diagnosis not present

## 2017-02-10 DIAGNOSIS — R7301 Impaired fasting glucose: Secondary | ICD-10-CM | POA: Diagnosis not present

## 2017-02-10 DIAGNOSIS — E782 Mixed hyperlipidemia: Secondary | ICD-10-CM | POA: Diagnosis not present

## 2017-03-02 ENCOUNTER — Other Ambulatory Visit: Payer: Self-pay | Admitting: Internal Medicine

## 2017-03-02 DIAGNOSIS — Z1231 Encounter for screening mammogram for malignant neoplasm of breast: Secondary | ICD-10-CM

## 2017-03-28 DIAGNOSIS — M0579 Rheumatoid arthritis with rheumatoid factor of multiple sites without organ or systems involvement: Secondary | ICD-10-CM | POA: Diagnosis not present

## 2017-03-28 DIAGNOSIS — M797 Fibromyalgia: Secondary | ICD-10-CM | POA: Diagnosis not present

## 2017-03-28 DIAGNOSIS — M549 Dorsalgia, unspecified: Secondary | ICD-10-CM | POA: Diagnosis not present

## 2017-03-28 DIAGNOSIS — M109 Gout, unspecified: Secondary | ICD-10-CM | POA: Diagnosis not present

## 2017-03-28 DIAGNOSIS — Z79899 Other long term (current) drug therapy: Secondary | ICD-10-CM | POA: Diagnosis not present

## 2017-04-06 ENCOUNTER — Encounter: Payer: Self-pay | Admitting: Cardiology

## 2017-04-14 ENCOUNTER — Ambulatory Visit: Payer: Medicare Other | Admitting: Cardiology

## 2017-05-24 DIAGNOSIS — Z23 Encounter for immunization: Secondary | ICD-10-CM | POA: Diagnosis not present

## 2017-12-05 ENCOUNTER — Other Ambulatory Visit: Payer: Self-pay | Admitting: Physician Assistant

## 2017-12-05 DIAGNOSIS — I2510 Atherosclerotic heart disease of native coronary artery without angina pectoris: Secondary | ICD-10-CM

## 2017-12-19 DIAGNOSIS — M79671 Pain in right foot: Secondary | ICD-10-CM | POA: Diagnosis not present

## 2017-12-19 DIAGNOSIS — M79672 Pain in left foot: Secondary | ICD-10-CM | POA: Diagnosis not present

## 2017-12-19 DIAGNOSIS — M109 Gout, unspecified: Secondary | ICD-10-CM | POA: Diagnosis not present

## 2017-12-19 DIAGNOSIS — M19071 Primary osteoarthritis, right ankle and foot: Secondary | ICD-10-CM | POA: Diagnosis not present

## 2017-12-19 DIAGNOSIS — M19072 Primary osteoarthritis, left ankle and foot: Secondary | ICD-10-CM | POA: Diagnosis not present

## 2017-12-19 DIAGNOSIS — M1990 Unspecified osteoarthritis, unspecified site: Secondary | ICD-10-CM | POA: Diagnosis not present

## 2017-12-19 DIAGNOSIS — M0579 Rheumatoid arthritis with rheumatoid factor of multiple sites without organ or systems involvement: Secondary | ICD-10-CM | POA: Diagnosis not present

## 2017-12-19 DIAGNOSIS — R768 Other specified abnormal immunological findings in serum: Secondary | ICD-10-CM | POA: Diagnosis not present

## 2017-12-19 DIAGNOSIS — Z79899 Other long term (current) drug therapy: Secondary | ICD-10-CM | POA: Diagnosis not present

## 2017-12-19 DIAGNOSIS — M549 Dorsalgia, unspecified: Secondary | ICD-10-CM | POA: Diagnosis not present

## 2017-12-19 DIAGNOSIS — M797 Fibromyalgia: Secondary | ICD-10-CM | POA: Diagnosis not present

## 2017-12-19 DIAGNOSIS — M79673 Pain in unspecified foot: Secondary | ICD-10-CM | POA: Diagnosis not present

## 2017-12-20 ENCOUNTER — Other Ambulatory Visit: Payer: Self-pay | Admitting: Physician Assistant

## 2017-12-26 ENCOUNTER — Encounter: Payer: Self-pay | Admitting: *Deleted

## 2017-12-27 ENCOUNTER — Ambulatory Visit: Payer: Medicare Other | Admitting: Physician Assistant

## 2017-12-27 DIAGNOSIS — Z6838 Body mass index (BMI) 38.0-38.9, adult: Secondary | ICD-10-CM | POA: Diagnosis not present

## 2017-12-27 DIAGNOSIS — I10 Essential (primary) hypertension: Secondary | ICD-10-CM | POA: Diagnosis not present

## 2017-12-27 DIAGNOSIS — L309 Dermatitis, unspecified: Secondary | ICD-10-CM | POA: Diagnosis not present

## 2017-12-27 DIAGNOSIS — M2550 Pain in unspecified joint: Secondary | ICD-10-CM | POA: Diagnosis not present

## 2017-12-27 DIAGNOSIS — B3781 Candidal esophagitis: Secondary | ICD-10-CM | POA: Diagnosis not present

## 2017-12-27 DIAGNOSIS — J329 Chronic sinusitis, unspecified: Secondary | ICD-10-CM | POA: Diagnosis not present

## 2017-12-27 DIAGNOSIS — M0689 Other specified rheumatoid arthritis, multiple sites: Secondary | ICD-10-CM | POA: Diagnosis not present

## 2017-12-27 DIAGNOSIS — M329 Systemic lupus erythematosus, unspecified: Secondary | ICD-10-CM | POA: Diagnosis not present

## 2017-12-27 DIAGNOSIS — Z1389 Encounter for screening for other disorder: Secondary | ICD-10-CM | POA: Diagnosis not present

## 2018-02-02 ENCOUNTER — Other Ambulatory Visit: Payer: Self-pay | Admitting: Physician Assistant

## 2018-02-02 DIAGNOSIS — I2510 Atherosclerotic heart disease of native coronary artery without angina pectoris: Secondary | ICD-10-CM

## 2018-02-20 NOTE — Progress Notes (Signed)
Cardiology Office Note    Date:  02/21/2018   ID:  Cindy Holmes, DOB Mar 09, 1951, MRN [id number]  PCP:  Redmond School, MD  Cardiologist: Ena Dawley, MD  Chief Complaint  Patient presents with  . Follow-up    History of Present Illness:  Cindy Holmes is a 67 y.o. female history of CAD status post BMS to the proximal mid LAD and PTCA of diagonal 1 and 2001, PTCA of ostial diagonal 1 and 2002, last cath 2017 moderate in-stent restenosis of the proximal LAD stent, mild disease in the ostial diagonal 1 and mid distal LAD stent normal LV function.  2D echo 2018 normal LVEF 65 to 70% with grade 2 DD.  Hypertension, HLD, chronic diastolic CHF, morbid obesity, question of prior stroke/TIA.  Last saw Melina Copa, PA-C 01/11/2017 at which time she was worried about narrowing of the kidney artery on a CT scan.  Renal artery Dopplers were done on 01/27/2017 and were normal.  Patient was just diagnosed with lupus.Has multiple questions surrounding her lupus that need to be answered by her rheumatologist.  Very focused on not be denied notes with lupus when she was hospitalized last year.  Was given a prescription for hydroxychloroquine and asking if she should take it.  Complains of dyspnea on exertion, no chest pain.  Says she is due for a nuclear stress test because she has them done every 2 years.  She does not want to schedule it till October and because she is too busy.    Past Medical History:  Diagnosis Date  . CAD (coronary artery disease)    a. PTCA LAD 2001/cath 2002-prox and mid LAD stents patent, PTCA ostium Dx 1 b. cath 03/2016: patent LAD stent with less than 40% in-stent restenosis, 10-30% stenosis of D1 and distal LAD with continued medical management recommended.  . Candida esophagitis ([place])   . Carotid artery disease ([place])    Doppler September, 2011, 0-39% bilateral disease mild  . Chronic diastolic CHF (congestive heart failure) ([place])   . Edema   . Emotional stress  reaction    8/11  . HTN (hypertension)   . Hyperlipidemia   . Leg pain    July, 2012, Arterial Dopplers March 08, 2011 normal  . 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 PONV (postoperative nausea and vomiting)   . Rheumatoid arthritis ([place])   . Right rotator cuff tear 11/16/2013  . Stroke [facility])    mini stroke in past ???    Past Surgical History:  Procedure Laterality Date  . CARDIAC CATHETERIZATION     with stent placement in 2001  . CARDIAC CATHETERIZATION N/A 03/12/2016   Procedure: Left Heart Cath and Coronary Angiography;  Surgeon: Nelva Bush, MD;  Location: [place] [facility];  Service: Cardiovascular;  Laterality: N/A;  . CARDIAC CATHETERIZATION N/A 03/12/2016   Procedure: Intravascular Pressure Wire/FFR Study;  Surgeon: Nelva Bush, MD;  Location: [place] [facility];  Service: Cardiovascular;  Laterality: N/A;  . CHOLECYSTECTOMY    . CORONARY ANGIOPLASTY     2002  . ESOPHAGOGASTRODUODENOSCOPY N/A 12/03/2016   Procedure: ESOPHAGOGASTRODUODENOSCOPY (EGD);  Surgeon: Carol Ada, MD;  Location: [place];  Service: Endoscopy;  Laterality: N/A;  . laproscopic adjustable gastric  banding     with APS standard system.   . Pt has 3 stents     sept 2001  .  SHOULDER ARTHROSCOPY WITH SUBACROMIAL DECOMPRESSION, ROTATOR CUFF REPAIR AND BICEP TENDON REPAIR Right 11/16/2013   Procedure: RIGHT SHOULDER ARTHROSCOPY, EXTENSIVE DEBRIDEMENT, ROTATOR CUFF REPAIR ;  Surgeon: [name], MD;  Location: [facility];  Service: Orthopedics;  Laterality: Right;  . TUBAL LIGATION      Current Medications: Current Meds  Medication Sig  . allopurinol (ZYLOPRIM) 300 MG tablet Take 300 mg by mouth daily.  Marland Kitchen alum & mag hydroxide-simeth (MAALOX/MYLANTA) 200-200-20 MG/5ML suspension Take 30 mLs by mouth every 4 (four) hours as needed for indigestion or heartburn.    Marland Kitchen aspirin 81 MG tablet Take 81 mg by mouth daily.    Marland Kitchen atorvastatin (LIPITOR) 40 MG tablet TAKE 2 TABLETS BY MOUTH  DAILY  . clopidogrel (PLAVIX) 75 MG tablet Take 1 tablet (75 mg total) by mouth daily.  Marland Kitchen desloratadine (CLARINEX) 5 MG tablet Take 5 mg by mouth daily.  . diazepam (VALIUM) 5 MG tablet Take 1 tablet by mouth every 8 (eight) hours as needed for anxiety.   Marland Kitchen esomeprazole (NEXIUM) 40 MG capsule Take 1 capsule (40 mg total) by mouth 2 (two) times daily before a meal. For acid reflux  . ezetimibe (ZETIA) 10 MG tablet Take 1 tablet (10 mg total) by mouth daily.  . folic acid (FOLVITE) 1 MG tablet Take 1 mg by mouth daily. Takes with chemo medication.  . furosemide (LASIX) 40 MG tablet TAKE 1 TABLET BY MOUTH  DAILY AND MAY TAKE AN  ADDITIONAL 1 TABLET DAILY  AS NEEDED FOR SWELLING  . gabapentin (NEURONTIN) 300 MG capsule Take 300 mg by mouth 3 (three) times daily as needed (pain).  Marland Kitchen leflunomide (ARAVA) 20 MG tablet Take 20 mg by mouth daily.  . metoprolol tartrate (LOPRESSOR) 50 MG tablet TAKE 1 TABLET BY MOUTH ONCE DAILY  . nitroGLYCERIN (NITROSTAT) 0.4 MG SL tablet DISSOLVE 1 TABLET UNDER THE TONGUE EVERY 5 MINUTES AS  NEEDED (NOT TO EXCEED 3  TABLETS IN 15 MINS, CALL  911 IF CHEST PAIN PERSISTS  . Omega-3 Fatty Acids (FISH OIL) 1000 MG CAPS Take 1 capsule by mouth daily.   . predniSONE (DELTASONE) 5 MG tablet Take 10 mg by mouth daily with breakfast.   . ramipril (ALTACE) 2.5 MG capsule Take 1 capsule (2.5 mg total) by mouth daily.  Marland Kitchen senna-docusate (SENOKOT-S) 8.6-50 MG tablet Take 1 tablet by mouth daily as needed for mild constipation.  . sucralfate (CARAFATE) 1 GM/10ML suspension Take 10 mLs (1 g total) by mouth 4 (four) times daily -  with meals and at bedtime.  Marland Kitchen zolpidem (AMBIEN) 5 MG tablet Take 5 mg by mouth at bedtime.     Allergies:   Codeine phosphate; Amoxicillin; Penicillins; and Lidocaine   Social History   Socioeconomic History  . Marital status: Married     Spouse name: Not on file  . Number of children: Not on file  . Years of education: Not on file  . Highest education level: Not on file  Occupational History  . Not on file  Social Needs  . Financial resource strain: Not on file  . Food insecurity:    Worry: Not on file    Inability: Not on file  . Transportation needs:    Medical: Not on file    Non-medical: Not on file  Tobacco Use  . Smoking status: Never Smoker  . Smokeless tobacco: Never Used  Substance and Sexual Activity  . Alcohol use: No    Alcohol/week: 0.0 standard  drinks  . Drug use: No  . Sexual activity: Never  Lifestyle  . Physical activity:    Days per week: Not on file    Minutes per session: Not on file  . Stress: Not on file  Relationships  . Social connections:    Talks on phone: Not on file    Gets together: Not on file    Attends religious service: Not on file    Active member of club or organization: Not on file    Attends meetings of clubs or organizations: Not on file    Relationship status: Not on file  Other Topics Concern  . Not on file  Social History Narrative  . Not on file     Family History:  The patient's family history includes Arthritis/Rheumatoid in her father; Cancer in her unknown relative; Cirrhosis in her father; Colon cancer in her maternal grandmother; Diabetes in her brother and sister; Heart attack in her maternal aunt, maternal uncle, mother, and unknown relative; Heart disease in her brother, brother, maternal aunt, and mother; Throat cancer in her maternal aunt and maternal grandfather.   ROS:   Please see the history of present illness.    Review of Systems  Constitution: Positive for malaise/fatigue.  HENT: Negative.   Eyes: Negative.   Cardiovascular: Positive for dyspnea on exertion.  Respiratory: Positive for cough.   Hematologic/Lymphatic: Negative.   Musculoskeletal: Negative.  Negative for joint pain.  Gastrointestinal: Negative.   Genitourinary: Negative.     Neurological: Negative.    All other systems reviewed and are negative.   PHYSICAL EXAM:   VS:  BP 128/84   Pulse 68   Ht 5\' 3"  (1.6 m)   Wt 207 lb (93.9 kg)   SpO2 95%   BMI 36.67 kg/m   Physical Exam  GEN: Well nourished, well developed, in no acute distress  Neck: no JVD, carotid bruits, or masses Cardiac:RRR; no murmurs, rubs, or gallops  Respiratory:  clear to auscultation bilaterally, normal work of breathing GI: soft, nontender, nondistended, + BS Ext: without cyanosis, clubbing, or edema, Good distal pulses bilaterally Neuro:  Alert and Oriented x 3 Psych: euthymic mood, full affect  Wt Readings from Last 3 Encounters:  02/21/18 207 lb (93.9 kg)  01/11/17 231 lb 6.4 oz (105 kg)  12/03/16 239 lb 4.8 oz (108.5 kg)      Studies/Labs Reviewed:   EKG:  EKG is not ordered today.  She declined to have EKG. Recent Labs: No results found for requested labs within last 8760 hours.   Lipid Panel    Component Value Date/Time   CHOL 164 09/30/2016 0840   TRIG 110 09/30/2016 0840   HDL 74 09/30/2016 0840   CHOLHDL 2.2 09/30/2016 0840   CHOLHDL 2.3 03/12/2016 0252   VLDL 30 03/12/2016 0252   LDLCALC 68 09/30/2016 0840    Additional studies/ records that were reviewed today include:  Cardiac catheterization 2017.  Moderate in-stent restenosis of proximal LAD stent, which is not hemodynamically significant (FFR 0.84). 2.  Mild disease involving ostial D1 and mid/distal LAD stent. 3.  Normal LV contraction with upper normal LV filling pressure.   Plan: 1. Continue medical management and aggressive risk factor modification  2D echo 5/2018Study Conclusions   - Left ventricle: The cavity size was normal. Systolic function was   vigorous. The estimated ejection fraction was in the range of 65%   to 70%. Wall motion was normal; there were no regional wall   motion  abnormalities. Features are consistent with a pseudonormal   left ventricular filling pattern, with  concomitant abnormal   relaxation and increased filling pressure (grade 2 diastolic   dysfunction). - Left atrium: The atrium was mildly dilated.   Impressions:   - Technically difficult; definity used; vigorous LV systolic   function; moderate diastolic dysfunction; mild LAE.    Renal duplex 01/27/2017 Impressions Normal caliber abdominal aorta. The left kidney measures 1.8 cm smaller than the right.  Normal renal arteries, bilaterally. The IVC and renal veins are patent  Carotid Dopplers 01/2015 Impressions Heterogeneous plaque, bilaterally. Stable 1-39% bilateral ICA stenosis. Normal subclavian arteries, bilaterally. Patent vertebral arteries with antegrade flow. f/u 2 years  ASSESSMENT:    1. Coronary artery disease involving native coronary artery of native heart without angina pectoris   2. Chronic diastolic CHF (congestive heart failure) ([place])   3. Essential hypertension   4. Mixed hyperlipidemia   5. Overweight   6. Bilateral carotid artery stenosis   7. Dyspnea on exertion      PLAN:  In order of problems listed above:  CAD status post multiple PCI's in the past last cath 2017  with moderate in-stent restenosis of the proximal LAD not hemodynamically significant see above for details medical management recommended.  Patient with dyspnea on exertion but insists that she have a nuclear stress test because it has been over 2 years.  No chest pain.  Will schedule for late October with follow-up with Dr. Meda Coffee.  Chronic diastolic CHF last echo 3875 normal LVEF grade 2 DD no heart failure on exam.  Essential hypertension blood pressure stable.  On Altace and Lasix.  Hyperlipidemia LDL 68 09/30/16 continue Zetia and Lipitor. PCP to check labs  Obesity  Bilateral carotid artery stenosis 1 to 39% in 2016.  Will repeat Dopplers  Recent diagnosis with lupus and asked to take hydroxychloroquine.  There is risk of cardiomyopathy but she needs to decide whether or not  she should take this drug with her rheumatologist.  Medication Adjustments/Labs and Tests Ordered: Current medicines are reviewed at length with the patient today.  Concerns regarding medicines are outlined above.  Medication changes, Labs and Tests ordered today are listed in the Patient Instructions below. Patient Instructions  Medication Instructions: Your physician recommends that you continue on your current medications as directed. Please refer to the Current Medication list given to you today.   Labwork: None Ordered  Procedures/Testing: Your physician has requested that you have a lexiscan myoview in October. For further information please visit [email or web address]. Please follow instruction sheet, as given.   Your physician has requested that you have a carotid duplex in October. This test is an ultrasound of the carotid arteries in your neck. It looks at blood flow through these arteries that supply the brain with blood. Allow one hour for this exam. There are no restrictions or special instructions.   Follow-Up: Your physician recommends that you schedule a follow-up appointment in: December with Dr. Meda Coffee   Any Additional Special Instructions Will Be Listed Below (If Applicable).     If you need a refill on your cardiac medications before your next appointment, please call your pharmacy.      Sumner Boast, PA-C  02/21/2018 9:34 AM    [facility] [address], [place], [place]  [address] Phone: [phone]; Fax: [phone]

## 2018-02-21 ENCOUNTER — Encounter (INDEPENDENT_AMBULATORY_CARE_PROVIDER_SITE_OTHER): Payer: Self-pay

## 2018-02-21 ENCOUNTER — Encounter: Payer: Self-pay | Admitting: Physician Assistant

## 2018-02-21 ENCOUNTER — Ambulatory Visit (INDEPENDENT_AMBULATORY_CARE_PROVIDER_SITE_OTHER): Payer: Medicare Other | Admitting: Physician Assistant

## 2018-02-21 VITALS — BP 128/84 | HR 68 | Ht 63.0 in | Wt 207.0 lb

## 2018-02-21 DIAGNOSIS — I2510 Atherosclerotic heart disease of native coronary artery without angina pectoris: Secondary | ICD-10-CM

## 2018-02-21 DIAGNOSIS — R0609 Other forms of dyspnea: Secondary | ICD-10-CM

## 2018-02-21 DIAGNOSIS — I5032 Chronic diastolic (congestive) heart failure: Secondary | ICD-10-CM

## 2018-02-21 DIAGNOSIS — I10 Essential (primary) hypertension: Secondary | ICD-10-CM

## 2018-02-21 DIAGNOSIS — E663 Overweight: Secondary | ICD-10-CM | POA: Diagnosis not present

## 2018-02-21 DIAGNOSIS — I6523 Occlusion and stenosis of bilateral carotid arteries: Secondary | ICD-10-CM | POA: Diagnosis not present

## 2018-02-21 DIAGNOSIS — E782 Mixed hyperlipidemia: Secondary | ICD-10-CM | POA: Diagnosis not present

## 2018-02-21 NOTE — Patient Instructions (Signed)
Medication Instructions: Your physician recommends that you continue on your current medications as directed. Please refer to the Current Medication list given to you today.   Labwork: None Ordered  Procedures/Testing: Your physician has requested that you have a lexiscan myoview in October. For further information please visit [email or web address]. Please follow instruction sheet, as given.   Your physician has requested that you have a carotid duplex in October. This test is an ultrasound of the carotid arteries in your neck. It looks at blood flow through these arteries that supply the brain with blood. Allow one hour for this exam. There are no restrictions or special instructions.   Follow-Up: Your physician recommends that you schedule a follow-up appointment in: December with Dr. Meda Coffee   Any Additional Special Instructions Will Be Listed Below (If Applicable).     If you need a refill on your cardiac medications before your next appointment, please call your pharmacy.

## 2018-02-27 DIAGNOSIS — M0579 Rheumatoid arthritis with rheumatoid factor of multiple sites without organ or systems involvement: Secondary | ICD-10-CM | POA: Diagnosis not present

## 2018-02-27 DIAGNOSIS — M109 Gout, unspecified: Secondary | ICD-10-CM | POA: Diagnosis not present

## 2018-02-27 DIAGNOSIS — Z79899 Other long term (current) drug therapy: Secondary | ICD-10-CM | POA: Diagnosis not present

## 2018-02-27 DIAGNOSIS — R768 Other specified abnormal immunological findings in serum: Secondary | ICD-10-CM | POA: Diagnosis not present

## 2018-04-11 ENCOUNTER — Other Ambulatory Visit: Payer: Self-pay | Admitting: Cardiology

## 2018-04-11 DIAGNOSIS — I2510 Atherosclerotic heart disease of native coronary artery without angina pectoris: Secondary | ICD-10-CM

## 2018-05-02 DIAGNOSIS — Z23 Encounter for immunization: Secondary | ICD-10-CM | POA: Diagnosis not present

## 2018-05-13 ENCOUNTER — Inpatient Hospital Stay (HOSPITAL_COMMUNITY)
Admission: EM | Admit: 2018-05-13 | Discharge: 2018-05-22 | DRG: 493 | Disposition: A | Discharge status: Skilled Nursing Facility | Payer: Medicare Other | Attending: Family Medicine | Admitting: Family Medicine

## 2018-05-13 ENCOUNTER — Emergency Department (HOSPITAL_COMMUNITY): Payer: Medicare Other

## 2018-05-13 ENCOUNTER — Other Ambulatory Visit: Payer: Self-pay

## 2018-05-13 ENCOUNTER — Encounter (HOSPITAL_COMMUNITY): Payer: Self-pay | Admitting: Emergency Medicine

## 2018-05-13 DIAGNOSIS — Y92009 Unspecified place in unspecified non-institutional (private) residence as the place of occurrence of the external cause: Secondary | ICD-10-CM

## 2018-05-13 DIAGNOSIS — Z7902 Long term (current) use of antithrombotics/antiplatelets: Secondary | ICD-10-CM

## 2018-05-13 DIAGNOSIS — S92241A Displaced fracture of medial cuneiform of right foot, initial encounter for closed fracture: Secondary | ICD-10-CM | POA: Diagnosis not present

## 2018-05-13 DIAGNOSIS — S52571A Other intraarticular fracture of lower end of right radius, initial encounter for closed fracture: Secondary | ICD-10-CM | POA: Diagnosis present

## 2018-05-13 DIAGNOSIS — E785 Hyperlipidemia, unspecified: Secondary | ICD-10-CM | POA: Diagnosis present

## 2018-05-13 DIAGNOSIS — Z419 Encounter for procedure for purposes other than remedying health state, unspecified: Secondary | ICD-10-CM

## 2018-05-13 DIAGNOSIS — Z8673 Personal history of transient ischemic attack (TIA), and cerebral infarction without residual deficits: Secondary | ICD-10-CM

## 2018-05-13 DIAGNOSIS — M1990 Unspecified osteoarthritis, unspecified site: Secondary | ICD-10-CM | POA: Diagnosis present

## 2018-05-13 DIAGNOSIS — I5032 Chronic diastolic (congestive) heart failure: Secondary | ICD-10-CM | POA: Diagnosis present

## 2018-05-13 DIAGNOSIS — D638 Anemia in other chronic diseases classified elsewhere: Secondary | ICD-10-CM | POA: Diagnosis present

## 2018-05-13 DIAGNOSIS — S6992XA Unspecified injury of left wrist, hand and finger(s), initial encounter: Secondary | ICD-10-CM | POA: Diagnosis not present

## 2018-05-13 DIAGNOSIS — R110 Nausea: Secondary | ICD-10-CM | POA: Diagnosis not present

## 2018-05-13 DIAGNOSIS — W19XXXA Unspecified fall, initial encounter: Secondary | ICD-10-CM | POA: Diagnosis not present

## 2018-05-13 DIAGNOSIS — S82841A Displaced bimalleolar fracture of right lower leg, initial encounter for closed fracture: Secondary | ICD-10-CM | POA: Diagnosis not present

## 2018-05-13 DIAGNOSIS — D62 Acute posthemorrhagic anemia: Secondary | ICD-10-CM | POA: Diagnosis not present

## 2018-05-13 DIAGNOSIS — T07XXXA Unspecified multiple injuries, initial encounter: Secondary | ICD-10-CM | POA: Diagnosis not present

## 2018-05-13 DIAGNOSIS — R000 Tachycardia, unspecified: Secondary | ICD-10-CM | POA: Diagnosis not present

## 2018-05-13 DIAGNOSIS — Z7982 Long term (current) use of aspirin: Secondary | ICD-10-CM

## 2018-05-13 DIAGNOSIS — E876 Hypokalemia: Secondary | ICD-10-CM | POA: Diagnosis present

## 2018-05-13 DIAGNOSIS — W102XXA Fall (on)(from) incline, initial encounter: Secondary | ICD-10-CM

## 2018-05-13 DIAGNOSIS — S52501A Unspecified fracture of the lower end of right radius, initial encounter for closed fracture: Secondary | ICD-10-CM

## 2018-05-13 DIAGNOSIS — Z955 Presence of coronary angioplasty implant and graft: Secondary | ICD-10-CM

## 2018-05-13 DIAGNOSIS — M25532 Pain in left wrist: Secondary | ICD-10-CM | POA: Diagnosis not present

## 2018-05-13 DIAGNOSIS — R52 Pain, unspecified: Secondary | ICD-10-CM | POA: Diagnosis not present

## 2018-05-13 DIAGNOSIS — S0993XA Unspecified injury of face, initial encounter: Secondary | ICD-10-CM | POA: Diagnosis not present

## 2018-05-13 DIAGNOSIS — I110 Hypertensive heart disease with heart failure: Secondary | ICD-10-CM | POA: Diagnosis present

## 2018-05-13 DIAGNOSIS — S3991XA Unspecified injury of abdomen, initial encounter: Secondary | ICD-10-CM | POA: Diagnosis not present

## 2018-05-13 DIAGNOSIS — S82831A Other fracture of upper and lower end of right fibula, initial encounter for closed fracture: Secondary | ICD-10-CM | POA: Diagnosis not present

## 2018-05-13 DIAGNOSIS — Z800 Family history of malignant neoplasm of digestive organs: Secondary | ICD-10-CM

## 2018-05-13 DIAGNOSIS — Z880 Allergy status to penicillin: Secondary | ICD-10-CM

## 2018-05-13 DIAGNOSIS — S82141A Displaced bicondylar fracture of right tibia, initial encounter for closed fracture: Secondary | ICD-10-CM | POA: Diagnosis not present

## 2018-05-13 DIAGNOSIS — S3993XA Unspecified injury of pelvis, initial encounter: Secondary | ICD-10-CM | POA: Diagnosis not present

## 2018-05-13 DIAGNOSIS — S82142A Displaced bicondylar fracture of left tibia, initial encounter for closed fracture: Secondary | ICD-10-CM

## 2018-05-13 DIAGNOSIS — M79644 Pain in right finger(s): Secondary | ICD-10-CM | POA: Diagnosis not present

## 2018-05-13 DIAGNOSIS — M810 Age-related osteoporosis without current pathological fracture: Secondary | ICD-10-CM | POA: Diagnosis present

## 2018-05-13 DIAGNOSIS — Z9049 Acquired absence of other specified parts of digestive tract: Secondary | ICD-10-CM

## 2018-05-13 DIAGNOSIS — S82851A Displaced trimalleolar fracture of right lower leg, initial encounter for closed fracture: Secondary | ICD-10-CM

## 2018-05-13 DIAGNOSIS — I959 Hypotension, unspecified: Secondary | ICD-10-CM | POA: Diagnosis not present

## 2018-05-13 DIAGNOSIS — S4992XA Unspecified injury of left shoulder and upper arm, initial encounter: Secondary | ICD-10-CM | POA: Diagnosis not present

## 2018-05-13 DIAGNOSIS — S52591A Other fractures of lower end of right radius, initial encounter for closed fracture: Secondary | ICD-10-CM | POA: Diagnosis not present

## 2018-05-13 DIAGNOSIS — R402412 Glasgow coma scale score 13-15, at arrival to emergency department: Secondary | ICD-10-CM | POA: Diagnosis present

## 2018-05-13 DIAGNOSIS — Z7952 Long term (current) use of systemic steroids: Secondary | ICD-10-CM

## 2018-05-13 DIAGNOSIS — S299XXA Unspecified injury of thorax, initial encounter: Secondary | ICD-10-CM | POA: Diagnosis not present

## 2018-05-13 DIAGNOSIS — S82251A Displaced comminuted fracture of shaft of right tibia, initial encounter for closed fracture: Secondary | ICD-10-CM | POA: Diagnosis not present

## 2018-05-13 DIAGNOSIS — I10 Essential (primary) hypertension: Secondary | ICD-10-CM | POA: Diagnosis present

## 2018-05-13 DIAGNOSIS — Z808 Family history of malignant neoplasm of other organs or systems: Secondary | ICD-10-CM

## 2018-05-13 DIAGNOSIS — M25512 Pain in left shoulder: Secondary | ICD-10-CM | POA: Diagnosis not present

## 2018-05-13 DIAGNOSIS — B001 Herpesviral vesicular dermatitis: Secondary | ICD-10-CM | POA: Diagnosis present

## 2018-05-13 DIAGNOSIS — M069 Rheumatoid arthritis, unspecified: Secondary | ICD-10-CM | POA: Diagnosis present

## 2018-05-13 DIAGNOSIS — Z79899 Other long term (current) drug therapy: Secondary | ICD-10-CM

## 2018-05-13 DIAGNOSIS — W109XXA Fall (on) (from) unspecified stairs and steps, initial encounter: Secondary | ICD-10-CM | POA: Diagnosis present

## 2018-05-13 DIAGNOSIS — S92321A Displaced fracture of second metatarsal bone, right foot, initial encounter for closed fracture: Secondary | ICD-10-CM | POA: Diagnosis present

## 2018-05-13 DIAGNOSIS — I739 Peripheral vascular disease, unspecified: Secondary | ICD-10-CM | POA: Diagnosis present

## 2018-05-13 DIAGNOSIS — I2510 Atherosclerotic heart disease of native coronary artery without angina pectoris: Secondary | ICD-10-CM | POA: Diagnosis present

## 2018-05-13 DIAGNOSIS — Z6836 Body mass index (BMI) 36.0-36.9, adult: Secondary | ICD-10-CM

## 2018-05-13 DIAGNOSIS — S82131A Displaced fracture of medial condyle of right tibia, initial encounter for closed fracture: Secondary | ICD-10-CM

## 2018-05-13 DIAGNOSIS — Z833 Family history of diabetes mellitus: Secondary | ICD-10-CM

## 2018-05-13 DIAGNOSIS — M109 Gout, unspecified: Secondary | ICD-10-CM | POA: Diagnosis present

## 2018-05-13 DIAGNOSIS — Z885 Allergy status to narcotic agent status: Secondary | ICD-10-CM

## 2018-05-13 DIAGNOSIS — Z8249 Family history of ischemic heart disease and other diseases of the circulatory system: Secondary | ICD-10-CM

## 2018-05-13 DIAGNOSIS — S0990XA Unspecified injury of head, initial encounter: Secondary | ICD-10-CM | POA: Diagnosis not present

## 2018-05-13 DIAGNOSIS — S199XXA Unspecified injury of neck, initial encounter: Secondary | ICD-10-CM | POA: Diagnosis not present

## 2018-05-13 MED ORDER — HYDROMORPHONE HCL 1 MG/ML IJ SOLN
1.0000 mg | Freq: Once | INTRAMUSCULAR | Status: AC
  Administered 2018-05-13: 1 mg via INTRAVENOUS
  Filled 2018-05-13: qty 1

## 2018-05-13 MED ORDER — ONDANSETRON HCL 4 MG/2ML IJ SOLN
4.0000 mg | Freq: Once | INTRAMUSCULAR | Status: AC
  Administered 2018-05-13: 4 mg via INTRAVENOUS
  Filled 2018-05-13: qty 2

## 2018-05-13 MED ORDER — HYDROMORPHONE HCL 1 MG/ML IJ SOLN
1.0000 mg | Freq: Once | INTRAMUSCULAR | Status: AC
  Administered 2018-05-14: 1 mg via INTRAVENOUS
  Filled 2018-05-13: qty 1

## 2018-05-13 NOTE — ED Provider Notes (Signed)
[facility] EMERGENCY DEPARTMENT Provider Note   CSN: [id number] Arrival date & time: 05/13/18  2154     History   Chief Complaint Chief Complaint  Patient presents with  . Fall    HPI Cindy Holmes is a 67 y.o. female.  Patient presents after a fall down a flight of steps outside.  These were Tre[employer] material.  She states she missed a step and fell down approximately 6 steps about 12 feet.  Complains of pain to her head, right wrist, right leg.  She does take aspirin and Plavix.  Denies losing consciousness.  Denies any neck or back pain.  Complains of pain to her right forearm right wrist, right knee and right lower leg.  Denies any dizzy spell or passing out.  Denies any chest pain or shortness of breath.  Denies any abdominal pain.  Denies any focal weakness, numbness or tingling.  The history is provided by the patient, the spouse and the EMS personnel.  Fall  Associated symptoms include headaches. Pertinent negatives include no chest pain, no abdominal pain and no shortness of breath.    Past Medical History:  Diagnosis Date  . CAD (coronary artery disease)    a. PTCA LAD 2001/cath 2002-prox and mid LAD stents patent, PTCA ostium Dx 1 b. cath 03/2016: patent LAD stent with less than 40% in-stent restenosis, 10-30% stenosis of D1 and distal LAD with continued medical management recommended.  . Candida esophagitis ([place])   . Carotid artery disease ([place])    Doppler September, 2011, 0-39% bilateral disease mild  . Chronic diastolic CHF (congestive heart failure) ([place])   . Edema   . Emotional stress reaction    8/11  . HTN (hypertension)   . Hyperlipidemia   . Leg pain    July, 2012, Arterial Dopplers March 08, 2011 normal  . 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 PONV (postoperative nausea and vomiting)   . Rheumatoid arthritis ([place])   . Right rotator cuff  tear 11/16/2013  . Stroke [facility])    mini stroke in past ???    Patient Active Problem List   Diagnosis Date Noted  . Candida esophagitis ([place]) 12/03/2016  . Hypokalemia 12/02/2016  . Lactic acidosis 12/02/2016  . AKI (acute kidney injury) ([place]) 12/02/2016  . Chronic diastolic CHF (congestive heart failure) ([place]) 12/02/2016  . Coronary artery disease involving native coronary artery of native heart without angina pectoris 11/13/2015  . Chest pain 11/13/2015  . Hyperlipemia 11/13/2015  . Acute on chronic diastolic CHF (congestive heart failure), NYHA class 3 ([place]) 11/13/2015  . Pain in joint, shoulder region 01/16/2014  . Muscle weakness (generalized) 01/16/2014  . Decreased range of motion of right shoulder 01/16/2014  . Right rotator cuff tear 11/16/2013  . Rotator cuff tear 11/16/2013  . Rheumatoid arthritis ([place])   . Essential hypertension   . Hyperlipidemia   . Stroke ([place])   . Emotional stress reaction   . Leg pain   . Carotid artery disease ([place])   . Overweight 11/04/2008  . EDEMA 11/04/2008    Past Surgical History:  Procedure Laterality Date  . CARDIAC CATHETERIZATION     with stent placement in 2001  . CARDIAC CATHETERIZATION N/A 03/12/2016   Procedure: Left Heart Cath and Coronary Angiography;  Surgeon: Nelva Bush, MD;  Location: [place] [facility] LAB;  Service: Cardiovascular;  Laterality: N/A;  .  CARDIAC CATHETERIZATION N/A 03/12/2016   Procedure: Intravascular Pressure Wire/FFR Study;  Surgeon: Nelva Bush, MD;  Location: [place] [facility];  Service: Cardiovascular;  Laterality: N/A;  . CHOLECYSTECTOMY    . CORONARY ANGIOPLASTY     2002  . ESOPHAGOGASTRODUODENOSCOPY N/A 12/03/2016   Procedure: ESOPHAGOGASTRODUODENOSCOPY (EGD);  Surgeon: Carol Ada, MD;  Location: [place];  Service: Endoscopy;  Laterality: N/A;  . laproscopic adjustable gastric  banding     with APS standard system.   . Pt has 3 stents     sept 2001  . SHOULDER ARTHROSCOPY WITH  SUBACROMIAL DECOMPRESSION, ROTATOR CUFF REPAIR AND BICEP TENDON REPAIR Right 11/16/2013   Procedure: RIGHT SHOULDER ARTHROSCOPY, EXTENSIVE DEBRIDEMENT, ROTATOR CUFF REPAIR ;  Surgeon: [name], MD;  Location: [place];  Service: Orthopedics;  Laterality: Right;  . TUBAL LIGATION       OB History   None      Home Medications    Prior to Admission medications   Medication Sig Start Date End Date Taking? Authorizing Provider  allopurinol (ZYLOPRIM) 100 MG tablet Take 400 mg by mouth daily.   Yes [provider]  alum & mag hydroxide-simeth (MAALOX/MYLANTA) 200-200-20 MG/5ML suspension Take 30 mLs by mouth every 4 (four) hours as needed for indigestion or heartburn. 12/04/16  Yes Lavina Hamman, MD  aspirin 81 MG tablet Take 81 mg by mouth daily.     Yes [provider]  atorvastatin (LIPITOR) 40 MG tablet Take 2 tablets (80 mg total) by mouth daily at 6 PM. 04/11/18  Yes Dorothy Spark, MD  clopidogrel (PLAVIX) 75 MG tablet Take 1 tablet (75 mg total) by mouth daily. 04/11/18  Yes Dorothy Spark, MD  esomeprazole (NEXIUM) 40 MG capsule Take 1 capsule (40 mg total) by mouth 2 (two) times daily before a meal. For acid reflux 12/04/16  Yes Lavina Hamman, MD  ezetimibe (ZETIA) 10 MG tablet Take 1 tablet (10 mg total) by mouth daily. 04/11/18  Yes Dorothy Spark, MD  folic acid (FOLVITE) 1 MG tablet Take 1 mg by mouth daily. Takes with chemo medication. 03/05/14  Yes [provider]  furosemide (LASIX) 40 MG tablet TAKE 1 TABLET BY MOUTH  DAILY AND MAY TAKE AN  ADDITIONAL 1 TABLET DAILY  AS NEEDED FOR SWELLING. 04/11/18  Yes Dorothy Spark, MD  hydroxychloroquine (PLAQUENIL) 200 MG tablet Take 300 mg by mouth daily. 03/22/18  Yes [provider]  leflunomide (ARAVA) 20 MG tablet Take 20 mg by mouth daily.   Yes [provider]  levocetirizine (XYZAL) 5 MG tablet Take 1 tablet by mouth daily. 04/04/18  Yes [provider]  methotrexate (RHEUMATREX) 2.5 MG tablet Take 10 mg by mouth once a week. Takes on Sundays. 05/07/18  Yes [provider]  metoprolol tartrate (LOPRESSOR) 50 MG tablet TAKE 1 TABLET BY MOUTH ONCE DAILY 04/11/18  Yes Dorothy Spark, MD  nitroGLYCERIN (NITROSTAT) 0.4 MG SL tablet DISSOLVE 1 TABLET UNDER THE TONGUE EVERY 5 MINUTES AS  NEEDED (NOT TO EXCEED 3  TABLETS IN 15 MINS, CALL  911 IF CHEST PAIN PERSISTS 02/03/18  Yes Dorothy Spark, MD  Omega-3 Fatty Acids (FISH OIL) 1000 MG CAPS Take 1 capsule by mouth daily.    Yes [provider]  predniSONE (DELTASONE) 5 MG tablet Take 10 mg by mouth daily with breakfast.    Yes [provider]  ramipril (ALTACE) 2.5 MG capsule Take 1 capsule (  2.5 mg total) by mouth daily. 04/11/18  Yes Dorothy Spark, MD  senna-docusate (SENOKOT-S) 8.6-50 MG tablet Take 1 tablet by mouth daily as needed for mild constipation.   Yes [provider]    Family History Family History  Problem Relation Age of Onset  . Heart attack Mother   . Heart disease Mother   . Diabetes Brother   . Heart disease Brother   . Diabetes Sister   . Arthritis/Rheumatoid Father   . Cirrhosis Father        hepatic cirrhosis  . Heart disease Maternal Aunt   . Heart attack Maternal Aunt   . Throat cancer Maternal Aunt   . Heart attack Maternal Uncle   . Heart disease Brother   . Cancer Unknown        family history  . Heart attack Unknown        family history  . Colon cancer Maternal Grandmother   . Throat cancer Maternal Grandfather     Social History Social History   Tobacco Use  . Smoking status: Never Smoker  . Smokeless tobacco: Never Used  Substance Use Topics  . Alcohol use: No    Alcohol/week: 0.0 standard drinks  . Drug use: No     Allergies   Codeine phosphate; Amoxicillin; Penicillins; Morphine and related; and Lidocaine   Review of Systems Review of Systems  Constitutional: Negative for  activity change, appetite change and fever.  HENT: Negative for congestion.   Respiratory: Negative for cough, chest tightness and shortness of breath.   Cardiovascular: Negative for chest pain.  Gastrointestinal: Negative for abdominal pain, nausea and vomiting.  Genitourinary: Negative for dysuria, hematuria, vaginal bleeding and vaginal discharge.  Musculoskeletal: Positive for arthralgias, back pain and myalgias.  Skin: Positive for wound. Negative for rash.  Neurological: Positive for headaches. Negative for dizziness and weakness.    all other systems are negative except as noted in the HPI and PMH.    Physical Exam Updated Vital Signs BP 124/68 (BP Location: Left Arm)   Pulse 96   Temp 98.2 F (36.8 C) (Oral)   Resp 20   Ht 5\' 3"  (1.6 m)   Wt 93.9 kg   SpO2 97% Comment: Simultaneous filing. User may not have seen previous data.  BMI 36.67 kg/m   Physical Exam  Constitutional: She is oriented to person, place, and time. She appears well-developed and well-nourished. No distress.  HENT:  Head: Normocephalic and atraumatic.  Mouth/Throat: Oropharynx is clear and moist. No oropharyngeal exudate.  Abrasion to right eyebrow, hematoma to left forehead  Eyes: Pupils are equal, round, and reactive to light. Conjunctivae and EOM are normal.  Neck: Normal range of motion. Neck supple.  No C spine tenderness, no step off or deformity  Cardiovascular: Normal rate, regular rhythm, normal heart sounds and intact distal pulses.  No murmur heard. Pulmonary/Chest: Effort normal and breath sounds normal. No respiratory distress. She exhibits no tenderness.  Abdominal: Soft. There is no tenderness. There is no rebound and no guarding.  Musculoskeletal: Normal range of motion. She exhibits edema, tenderness and deformity.  Tenderness and swelling to the right wrist with ecchymosis extending along the right forearm palmar surface.  Intact radial pulse  Ecchymosis to left shoulder  No  pain with range of motion of left elbow and left wrist.  Ecchymosis to left proximal thigh  Tenderness and swelling diffusely to right lower leg Pain with attempted range of motion of right knee and right ankle.  Able to wiggle toes.  Intact DP and PT pulse.  Compartments are firm but soft at this time.  Neurological: She is alert and oriented to person, place, and time. No cranial nerve deficit. She exhibits normal muscle tone. Coordination normal.  No ataxia on finger to nose bilaterally. No pronator drift. 5/5 strength throughout. CN 2-12 intact.Equal grip strength. Sensation intact.   Skin: Skin is warm. Capillary refill takes less than 2 seconds.  Psychiatric: She has a normal mood and affect. Her behavior is normal.  Nursing note and vitals reviewed.    ED Treatments / Results  Labs (all labs ordered are listed, but only abnormal results are displayed) Labs Reviewed  CBC WITH DIFFERENTIAL/PLATELET - Abnormal; Notable for the following components:      Result Value   WBC 18.7 (*)    RBC 3.74 (*)    Hemoglobin 11.2 (*)    HCT 35.4 (*)    Neutro Abs 15.7 (*)    Monocytes Absolute 1.2 (*)    Abs Immature Granulocytes 0.08 (*)    All other components within normal limits  BASIC METABOLIC PANEL - Abnormal; Notable for the following components:   Potassium 3.4 (*)    Glucose, Bld 142 (*)    Calcium 8.8 (*)    All other components within normal limits    EKG EKG Interpretation  Date/Time:  Saturday May 13 2018 23:59:52 EDT Ventricular Rate:  103 PR Interval:    QRS Duration: 76 QT Interval:  305 QTC Calculation: 400 R Axis:   72 Text Interpretation:  Sinus tachycardia Atrial premature complex Low voltage, precordial leads Nonspecific repol abnormality, diffuse leads No significant change was found Confirmed by Ezequiel Essex [phone]) on 05/14/2018 12:05:46 AM   Radiology Ct Head Wo Contrast  Result Date: 05/13/2018 CLINICAL DATA:  67 year old female with facial  trauma. EXAM: CT HEAD WITHOUT CONTRAST CT MAXILLOFACIAL WITHOUT CONTRAST CT CERVICAL SPINE WITHOUT CONTRAST TECHNIQUE: Multidetector CT imaging of the head, cervical spine, and maxillofacial structures were performed using the standard protocol without intravenous contrast. Multiplanar CT image reconstructions of the cervical spine and maxillofacial structures were also generated. COMPARISON:  Head CT dated 07/23/2007 FINDINGS: CT HEAD FINDINGS Brain: The ventricles and sulci appropriate size for patient's age. The gray-white matter discrimination is preserved. There is no acute intracranial hemorrhage. No mass effect or midline shift. No extra-axial fluid collection. Vascular: No hyperdense vessel or unexpected calcification. Skull: Normal. Negative for fracture or focal lesion. Other: None. CT MAXILLOFACIAL FINDINGS Osseous: No fracture or mandibular dislocation. No destructive process. Orbits: Negative. No traumatic or inflammatory finding. Sinuses: Clear. Soft tissues: Negative. CT CERVICAL SPINE FINDINGS Alignment: No acute subluxation. Straightening of normal cervical lordosis which may be positional or due to muscle spasm. Skull base and vertebrae: No acute fracture. Soft tissues and spinal canal: No prevertebral fluid or swelling. No visible canal hematoma. Disc levels:  Degenerative changes at C5-C6. Upper chest: Negative. Other: None IMPRESSION: 1. No acute intracranial pathology. 2. No acute facial bone fractures. 3. No acute/traumatic cervical spine pathology. Electronically Signed   By: Anner Crete M.D.   On: 05/13/2018 23:40   Ct Cervical Spine Wo Contrast  Result Date: 05/13/2018 CLINICAL DATA:  67 year old female with facial trauma. EXAM: CT HEAD WITHOUT CONTRAST CT MAXILLOFACIAL WITHOUT CONTRAST CT CERVICAL SPINE WITHOUT CONTRAST TECHNIQUE: Multidetector CT imaging of the head, cervical spine, and maxillofacial structures were performed using the standard protocol without intravenous  contrast. Multiplanar CT image reconstructions of the cervical spine and  maxillofacial structures were also generated. COMPARISON:  Head CT dated 07/23/2007 FINDINGS: CT HEAD FINDINGS Brain: The ventricles and sulci appropriate size for patient's age. The gray-white matter discrimination is preserved. There is no acute intracranial hemorrhage. No mass effect or midline shift. No extra-axial fluid collection. Vascular: No hyperdense vessel or unexpected calcification. Skull: Normal. Negative for fracture or focal lesion. Other: None. CT MAXILLOFACIAL FINDINGS Osseous: No fracture or mandibular dislocation. No destructive process. Orbits: Negative. No traumatic or inflammatory finding. Sinuses: Clear. Soft tissues: Negative. CT CERVICAL SPINE FINDINGS Alignment: No acute subluxation. Straightening of normal cervical lordosis which may be positional or due to muscle spasm. Skull base and vertebrae: No acute fracture. Soft tissues and spinal canal: No prevertebral fluid or swelling. No visible canal hematoma. Disc levels:  Degenerative changes at C5-C6. Upper chest: Negative. Other: None IMPRESSION: 1. No acute intracranial pathology. 2. No acute facial bone fractures. 3. No acute/traumatic cervical spine pathology. Electronically Signed   By: Anner Crete M.D.   On: 05/13/2018 23:40   Ct Maxillofacial Wo Contrast  Result Date: 05/13/2018 CLINICAL DATA:  67 year old female with facial trauma. EXAM: CT HEAD WITHOUT CONTRAST CT MAXILLOFACIAL WITHOUT CONTRAST CT CERVICAL SPINE WITHOUT CONTRAST TECHNIQUE: Multidetector CT imaging of the head, cervical spine, and maxillofacial structures were performed using the standard protocol without intravenous contrast. Multiplanar CT image reconstructions of the cervical spine and maxillofacial structures were also generated. COMPARISON:  Head CT dated 07/23/2007 FINDINGS: CT HEAD FINDINGS Brain: The ventricles and sulci appropriate size for patient's age. The gray-white  matter discrimination is preserved. There is no acute intracranial hemorrhage. No mass effect or midline shift. No extra-axial fluid collection. Vascular: No hyperdense vessel or unexpected calcification. Skull: Normal. Negative for fracture or focal lesion. Other: None. CT MAXILLOFACIAL FINDINGS Osseous: No fracture or mandibular dislocation. No destructive process. Orbits: Negative. No traumatic or inflammatory finding. Sinuses: Clear. Soft tissues: Negative. CT CERVICAL SPINE FINDINGS Alignment: No acute subluxation. Straightening of normal cervical lordosis which may be positional or due to muscle spasm. Skull base and vertebrae: No acute fracture. Soft tissues and spinal canal: No prevertebral fluid or swelling. No visible canal hematoma. Disc levels:  Degenerative changes at C5-C6. Upper chest: Negative. Other: None IMPRESSION: 1. No acute intracranial pathology. 2. No acute facial bone fractures. 3. No acute/traumatic cervical spine pathology. Electronically Signed   By: Anner Crete M.D.   On: 05/13/2018 23:40    Procedures Procedures (including critical care time)  Medications Ordered in ED Medications  HYDROmorphone (DILAUDID) injection 1 mg (has no administration in time range)  HYDROmorphone (DILAUDID) injection 1 mg (1 mg Intravenous Given 05/13/18 2230)  ondansetron (ZOFRAN) injection 4 mg (4 mg Intravenous Given 05/13/18 2225)     Initial Impression / Assessment and Plan / ED Course  I have reviewed the triage vital signs and the nursing notes.  Pertinent labs & imaging results that were available during my care of the patient were reviewed by me and considered in my medical decision making (see chart for details).    Patient on Plavix with mechanical fall down flight of steps.  GCS is 15.  ABCs are intact. Tachycardic with stable BP.  CT head and C-spine and facial CT negative.  Imaging shows right metaphyseal radius fracture, right tibial plateau fracture, right ankle  fracture. Neurovascularly intact. Patient does have some firmness to her right lower leg but compartments are still soft at this time.  Fractures discussed with Dr. Lucia Gaskins orthopedics who agrees with knee immobilizer,  lower leg splint, right radius splint and admission to trauma or medical service.  Does not wish hand surgery to be contacted separately. He agrees she is at risk for compartment syndrome and will need close evaluation.  D/w Trauma MD Dr. Dema Severin who accepts patient to [facility] ED.  Splints placed in the ED.  CT of chest abdomen and pelvis pending due to downtime at time of transfer. Heart rate 104, blood pressure 120/62.  Patient transferred to the ED to see trauma surgery as well as orthopedic surgery provided Dr. Sabra Heck excepts to [facility], ED.  CRITICAL CARE Performed by: Ezequiel Essex Total critical care time: 45 minutes Critical care time was exclusive of separately billable procedures and treating other patients. Critical care was necessary to treat or prevent imminent or life-threatening deterioration. Critical care was time spent personally by me on the following activities: development of treatment plan with patient and/or surrogate as well as nursing, discussions with consultants, evaluation of patient's response to treatment, examination of patient, obtaining history from patient or surrogate, ordering and performing treatments and interventions, ordering and review of laboratory studies, ordering and review of radiographic studies, pulse oximetry and re-evaluation of patient's condition.  Final Clinical Impressions(s) / ED Diagnoses   Final diagnoses:  Fall, initial encounter  Right medial tibial plateau fracture, closed, initial encounter  Closed bimalleolar fracture of right ankle, initial encounter  Closed fracture of distal end of right radius, unspecified fracture morphology, initial encounter    ED Discharge Orders    None       Branna Cortina, Annie Main,  MD 05/14/18 [phone]

## 2018-05-13 NOTE — ED Triage Notes (Signed)
Pt fell down 2 feet of steps. Chief complaint right knee pain. Patient given 4 mg's of morphine and 4 mg's of zofran by EMS. Left wrist pain.

## 2018-05-14 ENCOUNTER — Emergency Department (HOSPITAL_COMMUNITY): Payer: Medicare Other

## 2018-05-14 ENCOUNTER — Encounter (HOSPITAL_COMMUNITY): Payer: Self-pay | Admitting: Internal Medicine

## 2018-05-14 DIAGNOSIS — S82831A Other fracture of upper and lower end of right fibula, initial encounter for closed fracture: Secondary | ICD-10-CM | POA: Diagnosis not present

## 2018-05-14 DIAGNOSIS — S82201A Unspecified fracture of shaft of right tibia, initial encounter for closed fracture: Secondary | ICD-10-CM | POA: Diagnosis not present

## 2018-05-14 DIAGNOSIS — S52501A Unspecified fracture of the lower end of right radius, initial encounter for closed fracture: Secondary | ICD-10-CM | POA: Diagnosis not present

## 2018-05-14 DIAGNOSIS — S52501D Unspecified fracture of the lower end of right radius, subsequent encounter for closed fracture with routine healing: Secondary | ICD-10-CM | POA: Diagnosis not present

## 2018-05-14 DIAGNOSIS — S3993XA Unspecified injury of pelvis, initial encounter: Secondary | ICD-10-CM | POA: Diagnosis not present

## 2018-05-14 DIAGNOSIS — S82841A Displaced bimalleolar fracture of right lower leg, initial encounter for closed fracture: Secondary | ICD-10-CM | POA: Diagnosis not present

## 2018-05-14 DIAGNOSIS — R279 Unspecified lack of coordination: Secondary | ICD-10-CM | POA: Diagnosis not present

## 2018-05-14 DIAGNOSIS — Z885 Allergy status to narcotic agent status: Secondary | ICD-10-CM | POA: Diagnosis not present

## 2018-05-14 DIAGNOSIS — M109 Gout, unspecified: Secondary | ICD-10-CM | POA: Diagnosis present

## 2018-05-14 DIAGNOSIS — Z9049 Acquired absence of other specified parts of digestive tract: Secondary | ICD-10-CM | POA: Diagnosis not present

## 2018-05-14 DIAGNOSIS — Z8249 Family history of ischemic heart disease and other diseases of the circulatory system: Secondary | ICD-10-CM | POA: Diagnosis not present

## 2018-05-14 DIAGNOSIS — S93431A Sprain of tibiofibular ligament of right ankle, initial encounter: Secondary | ICD-10-CM | POA: Diagnosis not present

## 2018-05-14 DIAGNOSIS — S92321A Displaced fracture of second metatarsal bone, right foot, initial encounter for closed fracture: Secondary | ICD-10-CM | POA: Diagnosis present

## 2018-05-14 DIAGNOSIS — I5032 Chronic diastolic (congestive) heart failure: Secondary | ICD-10-CM | POA: Diagnosis present

## 2018-05-14 DIAGNOSIS — S52571D Other intraarticular fracture of lower end of right radius, subsequent encounter for closed fracture with routine healing: Secondary | ICD-10-CM | POA: Diagnosis not present

## 2018-05-14 DIAGNOSIS — G459 Transient cerebral ischemic attack, unspecified: Secondary | ICD-10-CM | POA: Diagnosis not present

## 2018-05-14 DIAGNOSIS — Z8673 Personal history of transient ischemic attack (TIA), and cerebral infarction without residual deficits: Secondary | ICD-10-CM | POA: Diagnosis not present

## 2018-05-14 DIAGNOSIS — I10 Essential (primary) hypertension: Secondary | ICD-10-CM

## 2018-05-14 DIAGNOSIS — W19XXXA Unspecified fall, initial encounter: Secondary | ICD-10-CM | POA: Diagnosis not present

## 2018-05-14 DIAGNOSIS — Z7401 Bed confinement status: Secondary | ICD-10-CM | POA: Diagnosis not present

## 2018-05-14 DIAGNOSIS — S52571A Other intraarticular fracture of lower end of right radius, initial encounter for closed fracture: Secondary | ICD-10-CM | POA: Diagnosis present

## 2018-05-14 DIAGNOSIS — S8261XD Displaced fracture of lateral malleolus of right fibula, subsequent encounter for closed fracture with routine healing: Secondary | ICD-10-CM | POA: Diagnosis not present

## 2018-05-14 DIAGNOSIS — S82841D Displaced bimalleolar fracture of right lower leg, subsequent encounter for closed fracture with routine healing: Secondary | ICD-10-CM | POA: Diagnosis not present

## 2018-05-14 DIAGNOSIS — W109XXA Fall (on) (from) unspecified stairs and steps, initial encounter: Secondary | ICD-10-CM | POA: Diagnosis not present

## 2018-05-14 DIAGNOSIS — D638 Anemia in other chronic diseases classified elsewhere: Secondary | ICD-10-CM | POA: Diagnosis present

## 2018-05-14 DIAGNOSIS — S3991XA Unspecified injury of abdomen, initial encounter: Secondary | ICD-10-CM | POA: Diagnosis not present

## 2018-05-14 DIAGNOSIS — Z9181 History of falling: Secondary | ICD-10-CM | POA: Diagnosis not present

## 2018-05-14 DIAGNOSIS — B001 Herpesviral vesicular dermatitis: Secondary | ICD-10-CM | POA: Diagnosis present

## 2018-05-14 DIAGNOSIS — M810 Age-related osteoporosis without current pathological fracture: Secondary | ICD-10-CM | POA: Diagnosis present

## 2018-05-14 DIAGNOSIS — Z833 Family history of diabetes mellitus: Secondary | ICD-10-CM | POA: Diagnosis not present

## 2018-05-14 DIAGNOSIS — Y92009 Unspecified place in unspecified non-institutional (private) residence as the place of occurrence of the external cause: Secondary | ICD-10-CM | POA: Diagnosis not present

## 2018-05-14 DIAGNOSIS — Z880 Allergy status to penicillin: Secondary | ICD-10-CM | POA: Diagnosis not present

## 2018-05-14 DIAGNOSIS — W102XXA Fall (on)(from) incline, initial encounter: Secondary | ICD-10-CM

## 2018-05-14 DIAGNOSIS — I2510 Atherosclerotic heart disease of native coronary artery without angina pectoris: Secondary | ICD-10-CM | POA: Diagnosis present

## 2018-05-14 DIAGNOSIS — I110 Hypertensive heart disease with heart failure: Secondary | ICD-10-CM | POA: Diagnosis present

## 2018-05-14 DIAGNOSIS — G90511 Complex regional pain syndrome I of right upper limb: Secondary | ICD-10-CM | POA: Diagnosis not present

## 2018-05-14 DIAGNOSIS — Z800 Family history of malignant neoplasm of digestive organs: Secondary | ICD-10-CM | POA: Diagnosis not present

## 2018-05-14 DIAGNOSIS — S82141A Displaced bicondylar fracture of right tibia, initial encounter for closed fracture: Principal | ICD-10-CM

## 2018-05-14 DIAGNOSIS — M069 Rheumatoid arthritis, unspecified: Secondary | ICD-10-CM | POA: Diagnosis present

## 2018-05-14 DIAGNOSIS — Z7952 Long term (current) use of systemic steroids: Secondary | ICD-10-CM | POA: Diagnosis not present

## 2018-05-14 DIAGNOSIS — Z969 Presence of functional implant, unspecified: Secondary | ICD-10-CM | POA: Diagnosis not present

## 2018-05-14 DIAGNOSIS — M329 Systemic lupus erythematosus, unspecified: Secondary | ICD-10-CM | POA: Diagnosis not present

## 2018-05-14 DIAGNOSIS — M2550 Pain in unspecified joint: Secondary | ICD-10-CM | POA: Diagnosis not present

## 2018-05-14 DIAGNOSIS — S82851A Displaced trimalleolar fracture of right lower leg, initial encounter for closed fracture: Secondary | ICD-10-CM | POA: Diagnosis present

## 2018-05-14 DIAGNOSIS — M059 Rheumatoid arthritis with rheumatoid factor, unspecified: Secondary | ICD-10-CM | POA: Diagnosis not present

## 2018-05-14 DIAGNOSIS — M6281 Muscle weakness (generalized): Secondary | ICD-10-CM | POA: Diagnosis not present

## 2018-05-14 DIAGNOSIS — S82251A Displaced comminuted fracture of shaft of right tibia, initial encounter for closed fracture: Secondary | ICD-10-CM | POA: Diagnosis not present

## 2018-05-14 DIAGNOSIS — D62 Acute posthemorrhagic anemia: Secondary | ICD-10-CM | POA: Diagnosis not present

## 2018-05-14 DIAGNOSIS — S299XXA Unspecified injury of thorax, initial encounter: Secondary | ICD-10-CM | POA: Diagnosis not present

## 2018-05-14 DIAGNOSIS — I959 Hypotension, unspecified: Secondary | ICD-10-CM | POA: Diagnosis not present

## 2018-05-14 DIAGNOSIS — S82121D Displaced fracture of lateral condyle of right tibia, subsequent encounter for closed fracture with routine healing: Secondary | ICD-10-CM | POA: Diagnosis not present

## 2018-05-14 DIAGNOSIS — S5291XA Unspecified fracture of right forearm, initial encounter for closed fracture: Secondary | ICD-10-CM | POA: Diagnosis not present

## 2018-05-14 DIAGNOSIS — Z4789 Encounter for other orthopedic aftercare: Secondary | ICD-10-CM | POA: Diagnosis not present

## 2018-05-14 DIAGNOSIS — E785 Hyperlipidemia, unspecified: Secondary | ICD-10-CM | POA: Diagnosis not present

## 2018-05-14 DIAGNOSIS — S92241A Displaced fracture of medial cuneiform of right foot, initial encounter for closed fracture: Secondary | ICD-10-CM | POA: Diagnosis present

## 2018-05-14 DIAGNOSIS — T07XXXA Unspecified multiple injuries, initial encounter: Secondary | ICD-10-CM

## 2018-05-14 DIAGNOSIS — Z955 Presence of coronary angioplasty implant and graft: Secondary | ICD-10-CM | POA: Diagnosis not present

## 2018-05-14 DIAGNOSIS — I739 Peripheral vascular disease, unspecified: Secondary | ICD-10-CM | POA: Diagnosis present

## 2018-05-14 DIAGNOSIS — S92321D Displaced fracture of second metatarsal bone, right foot, subsequent encounter for fracture with routine healing: Secondary | ICD-10-CM | POA: Diagnosis not present

## 2018-05-14 DIAGNOSIS — S52591A Other fractures of lower end of right radius, initial encounter for closed fracture: Secondary | ICD-10-CM | POA: Diagnosis not present

## 2018-05-14 DIAGNOSIS — E663 Overweight: Secondary | ICD-10-CM | POA: Diagnosis not present

## 2018-05-14 DIAGNOSIS — S8251XA Displaced fracture of medial malleolus of right tibia, initial encounter for closed fracture: Secondary | ICD-10-CM | POA: Diagnosis not present

## 2018-05-14 LAB — CBC WITH DIFFERENTIAL/PLATELET
ABS IMMATURE GRANULOCYTES: 0.08 10*3/uL — AB (ref 0.00–0.07)
BASOS PCT: 0 %
Basophils Absolute: 0.1 10*3/uL (ref 0.0–0.1)
Eosinophils Absolute: 0.1 10*3/uL (ref 0.0–0.5)
Eosinophils Relative: 0 %
HCT: 35.4 % — ABNORMAL LOW (ref 36.0–46.0)
HEMOGLOBIN: 11.2 g/dL — AB (ref 12.0–15.0)
IMMATURE GRANULOCYTES: 0 %
LYMPHS PCT: 9 %
Lymphs Abs: 1.6 10*3/uL (ref 0.7–4.0)
MCH: 29.9 pg (ref 26.0–34.0)
MCHC: 31.6 g/dL (ref 30.0–36.0)
MCV: 94.7 fL (ref 80.0–100.0)
MONOS PCT: 6 %
Monocytes Absolute: 1.2 10*3/uL — ABNORMAL HIGH (ref 0.1–1.0)
NEUTROS ABS: 15.7 10*3/uL — AB (ref 1.7–7.7)
NEUTROS PCT: 85 %
PLATELETS: 317 10*3/uL (ref 150–400)
RBC: 3.74 MIL/uL — ABNORMAL LOW (ref 3.87–5.11)
RDW: 14.7 % (ref 11.5–15.5)
WBC: 18.7 10*3/uL — ABNORMAL HIGH (ref 4.0–10.5)
nRBC: 0 % (ref 0.0–0.2)

## 2018-05-14 LAB — BASIC METABOLIC PANEL
ANION GAP: 9 (ref 5–15)
BUN: 14 mg/dL (ref 8–23)
CHLORIDE: 101 mmol/L (ref 98–111)
CO2: 25 mmol/L (ref 22–32)
Calcium: 8.8 mg/dL — ABNORMAL LOW (ref 8.9–10.3)
Creatinine, Ser: 0.66 mg/dL (ref 0.44–1.00)
GFR calc Af Amer: >60 mL/min (ref >60)
GLUCOSE: 142 mg/dL — AB (ref 70–99)
POTASSIUM: 3.4 mmol/L — AB (ref 3.5–5.1)
SODIUM: 135 mmol/L (ref 135–145)

## 2018-05-14 LAB — TYPE AND SCREEN
ABO/RH(D): A POS
Antibody Screen: NEGATIVE

## 2018-05-14 LAB — ABO/RH: ABO/RH(D): A POS

## 2018-05-14 LAB — HIV ANTIBODY (ROUTINE TESTING W REFLEX): HIV SCREEN 4TH GENERATION: NONREACTIVE

## 2018-05-14 LAB — MRSA PCR SCREENING: MRSA by PCR: NEGATIVE

## 2018-05-14 MED ORDER — ATORVASTATIN CALCIUM 80 MG PO TABS
80.0000 mg | ORAL_TABLET | Freq: Every day | ORAL | Status: DC
  Administered 2018-05-14 – 2018-05-20 (×6): 80 mg via ORAL
  Filled 2018-05-14 (×7): qty 1

## 2018-05-14 MED ORDER — PANTOPRAZOLE SODIUM 40 MG PO TBEC
80.0000 mg | DELAYED_RELEASE_TABLET | Freq: Every day | ORAL | Status: DC
  Administered 2018-05-14 – 2018-05-22 (×8): 80 mg via ORAL
  Filled 2018-05-14 (×8): qty 2

## 2018-05-14 MED ORDER — ALLOPURINOL 300 MG PO TABS
400.0000 mg | ORAL_TABLET | Freq: Every day | ORAL | Status: DC
  Administered 2018-05-14 – 2018-05-22 (×8): 400 mg via ORAL
  Filled 2018-05-14 (×8): qty 1

## 2018-05-14 MED ORDER — ONDANSETRON HCL 4 MG/2ML IJ SOLN
4.0000 mg | Freq: Once | INTRAMUSCULAR | Status: AC
  Administered 2018-05-14: 4 mg via INTRAVENOUS

## 2018-05-14 MED ORDER — HYDROXYCHLOROQUINE SULFATE 200 MG PO TABS
300.0000 mg | ORAL_TABLET | Freq: Every day | ORAL | Status: DC
  Administered 2018-05-14 – 2018-05-16 (×2): 300 mg via ORAL
  Filled 2018-05-14 (×2): qty 2

## 2018-05-14 MED ORDER — NALOXONE HCL 0.4 MG/ML IJ SOLN: 0.4000 mg | INTRAMUSCULAR | Status: DC | PRN

## 2018-05-14 MED ORDER — METOPROLOL TARTRATE 25 MG PO TABS: 50.0000 mg | ORAL_TABLET | Freq: Every day | ORAL | Status: DC

## 2018-05-14 MED ORDER — HYDROMORPHONE HCL 1 MG/ML IJ SOLN: 1.0000 mg | INTRAMUSCULAR | Status: DC | PRN

## 2018-05-14 MED ORDER — ONDANSETRON HCL 4 MG/2ML IJ SOLN
4.0000 mg | Freq: Four times a day (QID) | INTRAMUSCULAR | Status: DC | PRN
  Administered 2018-05-14 – 2018-05-15 (×2): 4 mg via INTRAVENOUS

## 2018-05-14 MED ORDER — PREDNISONE 20 MG PO TABS: 10.0000 mg | ORAL_TABLET | Freq: Every day | ORAL | Status: DC

## 2018-05-14 MED ORDER — DIPHENHYDRAMINE HCL 12.5 MG/5ML PO ELIX: 12.5000 mg | ORAL_SOLUTION | Freq: Four times a day (QID) | ORAL | Status: DC | PRN

## 2018-05-14 MED ORDER — FOLIC ACID 1 MG PO TABS
1.0000 mg | ORAL_TABLET | Freq: Every day | ORAL | Status: DC
  Administered 2018-05-14 – 2018-05-22 (×8): 1 mg via ORAL
  Filled 2018-05-14 (×8): qty 1

## 2018-05-14 MED ORDER — LORATADINE 10 MG PO TABS
10.0000 mg | ORAL_TABLET | Freq: Every day | ORAL | Status: DC
  Administered 2018-05-14 – 2018-05-22 (×8): 10 mg via ORAL
  Filled 2018-05-14 (×8): qty 1

## 2018-05-14 MED ORDER — IOPAMIDOL (ISOVUE-300) INJECTION 61%
100.0000 mL | Freq: Once | INTRAVENOUS | Status: AC | PRN
  Administered 2018-05-14: 100 mL via INTRAVENOUS

## 2018-05-14 MED ORDER — PREDNISONE 20 MG PO TABS
20.0000 mg | ORAL_TABLET | Freq: Every day | ORAL | Status: DC
  Administered 2018-05-16 – 2018-05-22 (×7): 20 mg via ORAL
  Filled 2018-05-14 (×6): qty 1

## 2018-05-14 MED ORDER — METHYLPREDNISOLONE SODIUM SUCC 125 MG IJ SOLR
40.0000 mg | Freq: Once | INTRAMUSCULAR | Status: AC
  Administered 2018-05-14: 40 mg via INTRAVENOUS
  Filled 2018-05-14: qty 2

## 2018-05-14 MED ORDER — ONDANSETRON HCL 4 MG/2ML IJ SOLN
4.0000 mg | Freq: Four times a day (QID) | INTRAMUSCULAR | Status: DC | PRN
  Administered 2018-05-21 – 2018-05-22 (×2): 4 mg via INTRAVENOUS
  Filled 2018-05-14 (×4): qty 2

## 2018-05-14 MED ORDER — DIPHENHYDRAMINE HCL 50 MG/ML IJ SOLN: 12.5000 mg | Freq: Four times a day (QID) | INTRAMUSCULAR | Status: DC | PRN

## 2018-05-14 MED ORDER — METHOTREXATE 2.5 MG PO TABS
10.0000 mg | ORAL_TABLET | ORAL | Status: DC
  Administered 2018-05-14: 10 mg via ORAL
  Filled 2018-05-14: qty 4

## 2018-05-14 MED ORDER — METOPROLOL TARTRATE 5 MG/5ML IV SOLN
2.5000 mg | Freq: Two times a day (BID) | INTRAVENOUS | Status: DC
  Administered 2018-05-14 – 2018-05-22 (×15): 2.5 mg via INTRAVENOUS
  Filled 2018-05-14 (×16): qty 5

## 2018-05-14 MED ORDER — LEFLUNOMIDE 20 MG PO TABS
20.0000 mg | ORAL_TABLET | Freq: Every day | ORAL | Status: DC
  Administered 2018-05-14: 20 mg via ORAL
  Filled 2018-05-14 (×2): qty 1

## 2018-05-14 MED ORDER — ALUM & MAG HYDROXIDE-SIMETH 200-200-20 MG/5ML PO SUSP
30.0000 mL | ORAL | Status: DC | PRN
  Administered 2018-05-21: 30 mL via ORAL
  Filled 2018-05-14: qty 30

## 2018-05-14 MED ORDER — ACETAMINOPHEN 650 MG RE SUPP: 650.0000 mg | Freq: Four times a day (QID) | RECTAL | Status: DC | PRN

## 2018-05-14 MED ORDER — SODIUM CHLORIDE 0.9% FLUSH: 9.0000 mL | INTRAVENOUS | Status: DC | PRN

## 2018-05-14 MED ORDER — GABAPENTIN 300 MG PO CAPS
300.0000 mg | ORAL_CAPSULE | Freq: Three times a day (TID) | ORAL | Status: DC
  Administered 2018-05-14 – 2018-05-22 (×21): 300 mg via ORAL
  Filled 2018-05-14 (×22): qty 1

## 2018-05-14 MED ORDER — HYDROMORPHONE 1 MG/ML IV SOLN
INTRAVENOUS | Status: DC
  Administered 2018-05-13: 0.5 mg via INTRAVENOUS
  Administered 2018-05-14: 0 mg via INTRAVENOUS
  Administered 2018-05-14: 0.4 mL via INTRAVENOUS
  Administered 2018-05-14: 1.2 mg via INTRAVENOUS
  Administered 2018-05-14: 2.9 mg via INTRAVENOUS
  Administered 2018-05-15: 0.6 mg via INTRAVENOUS
  Administered 2018-05-15: 0.3 mg via INTRAVENOUS
  Administered 2018-05-15: 2.4 mg via INTRAVENOUS
  Administered 2018-05-16: 0 mg via INTRAVENOUS
  Administered 2018-05-16: 2.4 mg via INTRAVENOUS
  Administered 2018-05-16: 0.6 mg via INTRAVENOUS
  Administered 2018-05-16: 0.9 mg via INTRAVENOUS
  Administered 2018-05-16: 4.2 mg via INTRAVENOUS
  Administered 2018-05-17: 0.9 mg via INTRAVENOUS
  Administered 2018-05-17: 1.2 mg via INTRAVENOUS
  Administered 2018-05-17: 0 mg via INTRAVENOUS
  Filled 2018-05-14 (×2): qty 25

## 2018-05-14 MED ORDER — ONDANSETRON HCL 4 MG/2ML IJ SOLN
INTRAMUSCULAR | Status: AC
  Administered 2018-05-14: 4 mg via INTRAVENOUS
  Filled 2018-05-14: qty 2

## 2018-05-14 MED ORDER — SODIUM CHLORIDE 0.9 % IV SOLN
INTRAVENOUS | Status: DC | PRN
  Administered 2018-05-14: 08:00:00 via INTRAVENOUS

## 2018-05-14 MED ORDER — SENNOSIDES-DOCUSATE SODIUM 8.6-50 MG PO TABS
1.0000 | ORAL_TABLET | Freq: Every day | ORAL | Status: DC | PRN
  Administered 2018-05-18: 1 via ORAL
  Filled 2018-05-14: qty 1

## 2018-05-14 MED ORDER — HYDROMORPHONE HCL 1 MG/ML IJ SOLN
INTRAMUSCULAR | Status: AC
  Administered 2018-05-14: 1 mg via INTRAVENOUS
  Filled 2018-05-14: qty 1

## 2018-05-14 MED ORDER — RAMIPRIL 2.5 MG PO CAPS: 2.5000 mg | ORAL_CAPSULE | Freq: Every day | ORAL | Status: DC

## 2018-05-14 MED ORDER — HYDROMORPHONE HCL 1 MG/ML IJ SOLN
1.0000 mg | Freq: Once | INTRAMUSCULAR | Status: AC
  Administered 2018-05-14: 1 mg via INTRAVENOUS
  Filled 2018-05-14: qty 1

## 2018-05-14 MED ORDER — ONDANSETRON HCL 4 MG PO TABS: 4.0000 mg | ORAL_TABLET | Freq: Four times a day (QID) | ORAL | Status: DC | PRN

## 2018-05-14 MED ORDER — RAMIPRIL 2.5 MG PO CAPS
2.5000 mg | ORAL_CAPSULE | Freq: Every day | ORAL | Status: DC
  Administered 2018-05-16 – 2018-05-22 (×7): 2.5 mg via ORAL
  Filled 2018-05-14 (×8): qty 1

## 2018-05-14 MED ORDER — POTASSIUM CHLORIDE 10 MEQ/100ML IV SOLN
10.0000 meq | INTRAVENOUS | Status: DC
  Filled 2018-05-14 (×2): qty 100

## 2018-05-14 MED ORDER — FUROSEMIDE 40 MG PO TABS
40.0000 mg | ORAL_TABLET | Freq: Every day | ORAL | Status: DC
  Administered 2018-05-16 – 2018-05-22 (×7): 40 mg via ORAL
  Filled 2018-05-14 (×7): qty 1

## 2018-05-14 MED ORDER — POTASSIUM CHLORIDE 10 MEQ/100ML IV SOLN
10.0000 meq | INTRAVENOUS | Status: AC
  Administered 2018-05-14 (×2): 10 meq via INTRAVENOUS
  Filled 2018-05-14 (×2): qty 100

## 2018-05-14 MED ORDER — HYDROMORPHONE HCL 1 MG/ML IJ SOLN
1.0000 mg | Freq: Once | INTRAMUSCULAR | Status: AC
  Administered 2018-05-14: 1 mg via INTRAVENOUS

## 2018-05-14 MED ORDER — SODIUM CHLORIDE 0.9 % IV BOLUS
1000.0000 mL | Freq: Once | INTRAVENOUS | Status: AC
  Administered 2018-05-14: 1000 mL via INTRAVENOUS

## 2018-05-14 MED ORDER — ACETAMINOPHEN 325 MG PO TABS
650.0000 mg | ORAL_TABLET | Freq: Four times a day (QID) | ORAL | Status: DC | PRN
  Administered 2018-05-15 – 2018-05-18 (×2): 650 mg via ORAL
  Filled 2018-05-14 (×2): qty 2

## 2018-05-14 NOTE — Consult Note (Addendum)
Reason for Consult: Right distal radius fracture, right lateral tibial plateau fracture, right ankle fracture, right middle cuneiform and second metatarsal base fracture, right fourth metatarsal base fracture. Referring Physician: [facility]  Cindy Holmes is an 67 y.o. female.  HPI: Patient has a history of rheumatoid arthritis, congestive heart failure, TIA, hypertension, borderline diabetes who had a fall yesterday down the stairs.  She takes Plavix at baseline due to her heart condition and prior stents.  Last time she took her Plavix was Friday.  She was seen initially at the [facility] emergency department diagnosed with the above injuries.  She was transferred to [facility] for definitive treatment and admission.  Patient seen this morning after orthopedic consultation complaining of pain in her right wrist, right knee and right ankle.  She says is excruciating.  It is sharp in quality. She recently had a PCA pump placed and this does not seem to be helping.  She also notes some nausea due to the pain medication.  She denies any numbness or tingling sensation in her right upper right lower extremity.  Patient states in the past she has had right rotator cuff surgery done by Dr. Mardelle Matte but this was 5 to 6 years ago.   Past Medical History:  Diagnosis Date  . CAD (coronary artery disease)    a. PTCA LAD 2001/cath 2002-prox and mid LAD stents patent, PTCA ostium Dx 1 b. cath 03/2016: patent LAD stent with less than 40% in-stent restenosis, 10-30% stenosis of D1 and distal LAD with continued medical management recommended.  . Candida esophagitis ([place])   . Carotid artery disease ([place])    Doppler September, 2011, 0-39% bilateral disease mild  . Chronic diastolic CHF (congestive heart failure) ([place])   . Edema   . Emotional stress reaction    8/11  . HTN (hypertension)   . Hyperlipidemia   . Leg pain    July, 2012, Arterial Dopplers March 08, 2011 normal  . 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 PONV (postoperative nausea and vomiting)   . Rheumatoid arthritis ([place])   . Right rotator cuff tear 11/16/2013  . Stroke [facility])    mini stroke in past ???    Past Surgical History:  Procedure Laterality Date  . CARDIAC CATHETERIZATION     with stent placement in 2001  . CARDIAC CATHETERIZATION N/A 03/12/2016   Procedure: Left Heart Cath and Coronary Angiography;  Surgeon: Nelva Bush, MD;  Location: [place] [facility];  Service: Cardiovascular;  Laterality: N/A;  . CARDIAC CATHETERIZATION N/A 03/12/2016   Procedure: Intravascular Pressure Wire/FFR Study;  Surgeon: Nelva Bush, MD;  Location: [place] [facility];  Service: Cardiovascular;  Laterality: N/A;  . CHOLECYSTECTOMY    . CORONARY ANGIOPLASTY     2002  . ESOPHAGOGASTRODUODENOSCOPY N/A 12/03/2016   Procedure: ESOPHAGOGASTRODUODENOSCOPY (EGD);  Surgeon: Carol Ada, MD;  Location: [place];  Service: Endoscopy;  Laterality: N/A;  . laproscopic adjustable gastric  banding     with APS standard system.   . Pt has 3 stents     sept 2001  . SHOULDER ARTHROSCOPY WITH SUBACROMIAL DECOMPRESSION, ROTATOR CUFF REPAIR AND BICEP TENDON REPAIR Right 11/16/2013   Procedure: RIGHT SHOULDER ARTHROSCOPY, EXTENSIVE DEBRIDEMENT, ROTATOR CUFF REPAIR ;  Surgeon: [name], MD;  Location: [place];  Service: Orthopedics;  Laterality: Right;  . TUBAL  LIGATION      Family History  Problem Relation Age of Onset  . Heart attack Mother   . Heart disease Mother   . Diabetes Brother   . Heart disease Brother   . Diabetes Sister   . Arthritis/Rheumatoid Father   . Cirrhosis Father        hepatic cirrhosis  . Heart disease Maternal Aunt   . Heart attack Maternal Aunt   . Throat cancer Maternal Aunt   . Heart attack Maternal Uncle   . Heart disease Brother   . Cancer Unknown        family history   . Heart attack Unknown        family history  . Colon cancer Maternal Grandmother   . Throat cancer Maternal Grandfather     Social History:  reports that she has never smoked. She has never used smokeless tobacco. She reports that she does not drink alcohol or use drugs.  Allergies:  Allergies  Allergen Reactions  . Codeine Phosphate Shortness Of Breath    REACTION: unspecified  . Amoxicillin Nausea And Vomiting    REACTION: unspecified  . Penicillins Nausea And Vomiting    Has patient had a PCN reaction causing immediate rash, facial/tongue/throat swelling, SOB or lightheadedness with hypotension:YES Has patient had a PCN reaction causing severe rash involving mucus membranes or skin necrosis: NO Has patient had a PCN reaction that required hospitalization NO Has patient had a PCN reaction occurring within the last 10 years: NO If all of the above answers are "NO", then may proceed with Cephalosporin use.  Marland Kitchen Morphine And Related Nausea And Vomiting  . Lidocaine Rash    Patch caused rash    Medications: I have reviewed the patient's current medications.  Results for orders placed or performed during the hospital encounter of 05/13/18 (from the past 48 hour(s))  CBC with Differential/Platelet     Status: Abnormal   Collection Time: 05/14/18 12:50 AM  Result Value Ref Range   WBC 18.7 (H) 4.0 - 10.5 K/uL   RBC 3.74 (L) 3.87 - 5.11 MIL/uL   Hemoglobin 11.2 (L) 12.0 - 15.0 g/dL   HCT 35.4 (L) 36.0 - 46.0 %   MCV 94.7 80.0 - 100.0 fL   MCH 29.9 26.0 - 34.0 pg   MCHC 31.6 30.0 - 36.0 g/dL   RDW 14.7 11.5 - 15.5 %   Platelets 317 150 - 400 K/uL   nRBC 0.0 0.0 - 0.2 %   Neutrophils Relative % 85 %   Neutro Abs 15.7 (H) 1.7 - 7.7 K/uL   Lymphocytes Relative 9 %   Lymphs Abs 1.6 0.7 - 4.0 K/uL   Monocytes Relative 6 %   Monocytes Absolute 1.2 (H) 0.1 - 1.0 K/uL   Eosinophils Relative 0 %   Eosinophils Absolute 0.1 0.0 - 0.5 K/uL   Basophils Relative 0 %   Basophils  Absolute 0.1 0.0 - 0.1 K/uL   Immature Granulocytes 0 %   Abs Immature Granulocytes 0.08 (H) 0.00 - 0.07 K/uL    Comment: Performed at [facility], [address] [place], [place] [address]  Basic metabolic panel     Status: Abnormal   Collection Time: 05/14/18 12:50 AM  Result Value Ref Range   Sodium 135 135 - 145 mmol/L   Potassium 3.4 (L) 3.5 - 5.1 mmol/L   Chloride 101 98 - 111 mmol/L   CO2 25 22 - 32 mmol/L   Glucose, Bld 142 (H) 70 -  99 mg/dL   BUN 14 8 - 23 mg/dL   Creatinine, Ser 0.66 0.44 - 1.00 mg/dL   Calcium 8.8 (L) 8.9 - 10.3 mg/dL   GFR calc non Af Amer >60 >60 mL/min   GFR calc Af Amer >60 >60 mL/min    Comment: (NOTE) The eGFR has been calculated using the CKD EPI equation. This calculation has not been validated in all clinical situations. eGFR's persistently <60 mL/min signify possible Chronic Kidney Disease.    Anion gap 9 5 - 15    Comment: Performed at [facility], [address], [place], [place] [address]  Type and screen [place]     Status: None   Collection Time: 05/14/18  4:26 AM  Result Value Ref Range   ABO/RH(D) A POS    Antibody Screen NEG    Sample Expiration      05/17/2018 Performed at [facility], [place] [address], [place], [place] [address]   ABO/Rh     Status: None (Preliminary result)   Collection Time: 05/14/18  4:26 AM  Result Value Ref Range   ABO/RH(D)      A POS Performed at [facility] [address] [place], [place] [address]     Dg Chest 2 View  Result Date: 05/14/2018 CLINICAL DATA:  67 year old female with fall. History of CHF. EXAM: CHEST - 2 VIEW COMPARISON:  Chest CT dated 05/14/2018 FINDINGS: There is cardiomegaly with probable mild vascular congestion. No focal consolidation, pleural effusion, or pneumothorax. No acute osseous pathology. IMPRESSION: Cardiomegaly with probable mild vascular congestion. No focal consolidation. Electronically Signed   By: Anner Crete M.D.   On:  05/14/2018 01:10   Dg Pelvis 1-2 Views  Result Date: 05/14/2018 CLINICAL DATA:  Fall EXAM: PELVIS - 1-2 VIEW COMPARISON:  None. FINDINGS: There is no evidence of pelvic fracture or diastasis. No pelvic bone lesions are seen. IMPRESSION: Negative. Electronically Signed   By: Kerby Moors M.D.   On: 05/14/2018 01:10   Dg Forearm Right  Result Date: 05/14/2018 CLINICAL DATA:  Fall.  Right wrist pain. EXAM: RIGHT FOREARM - 2 VIEW COMPARISON:  None. FINDINGS: There is a an acute intra-articular fracture deformity involving the distal radius. Dorsal angulation of the distal fracture fragments noted. No dislocation identified. IMPRESSION: 1. Acute fracture involves the dorsal aspect of the distal radius. Mild dorsal angulation of the distal fracture fragments. Electronically Signed   By: Kerby Moors M.D.   On: 05/14/2018 01:07   Dg Wrist Complete Left  Result Date: 05/14/2018 CLINICAL DATA:  68 year old female with fall and left wrist pain. EXAM: LEFT WRIST - COMPLETE 3+ VIEW COMPARISON:  None. FINDINGS: There is no acute fracture or dislocation. The bones are osteopenic. No significant arthritic changes. There is soft tissue swelling over the wrist and ulna. No radiopaque foreign object or soft tissue gas. IMPRESSION: No acute fracture or dislocation. Electronically Signed   By: Anner Crete M.D.   On: 05/14/2018 00:13   Dg Wrist Complete Right  Result Date: 05/14/2018 CLINICAL DATA:  Pain after fall EXAM: RIGHT WRIST - COMPLETE 3+ VIEW COMPARISON:  None. FINDINGS: There is a comminuted mildly displaced fracture through the distal radial metaphysis. IMPRESSION: Comminuted mildly displaced fracture through the distal radial metaphysis. Electronically Signed   By: Dorise Bullion III M.D   On: 05/14/2018 00:12   Dg Knee 2 Views Right  Result Date: 05/14/2018 CLINICAL DATA:  Pain after fall EXAM: RIGHT KNEE -  1-2 VIEW COMPARISON:  None. FINDINGS: A hemarthrosis is identified. There is a depressed  lateral tibial plateau fracture which is comminuted. IMPRESSION: Comminuted mildly depressed lateral tibial plateau fracture with resulting hemarthrosis. Electronically Signed   By: Dorise Bullion III M.D   On: 05/14/2018 00:08   Dg Tibia/fibula Right  Result Date: 05/14/2018 CLINICAL DATA:  Pain after fall EXAM: RIGHT TIBIA AND FIBULA - 2 VIEW COMPARISON:  None. FINDINGS: There is a comminuted depressed fracture through the lateral tibial plateau. There is a comminuted fracture through the distal fibular diaphysis. There is a lucency through the distal medial malleolus with a step-off. These hemarthrosis in the suprapatellar fossa. IMPRESSION: 1. Depressed comminuted lateral tibial plateau fracture. 2. Comminuted fracture through the distal fibular diaphysis. 3. Fracture through the distal medial malleolus. Electronically Signed   By: Dorise Bullion III M.D   On: 05/14/2018 00:14   Dg Ankle Complete Right  Result Date: 05/14/2018 CLINICAL DATA:  Pain after fall EXAM: RIGHT ANKLE - COMPLETE 3+ VIEW COMPARISON:  None. FINDINGS: There is a comminuted fracture through the distal fibular diaphysis. There is a fracture through the medial malleolus. There also be appears to be a fracture through the distal tibia where it abuts the fibula. Irregularity of the distal most fibula is consistent with an age indeterminate fracture but may not be acute. Soft tissue swelling. IMPRESSION: 1. Fracture through the medial malleolus. 2. Apparent fracture through the lateral aspect of the distal tibia with displacement. 3. Fracture through the fibular diaphysis distally. 4. Irregularity of the distal tip of the fibula may be nonacute from previous injury. 5. No definitive posterior malleolar fracture. However, given the complex nature of the ankle fracture, consider a CT scan for better evaluation. Electronically Signed   By: Dorise Bullion III M.D   On: 05/14/2018 00:17   Ct Head Wo Contrast  Result Date:  05/13/2018 CLINICAL DATA:  67 year old female with facial trauma. EXAM: CT HEAD WITHOUT CONTRAST CT MAXILLOFACIAL WITHOUT CONTRAST CT CERVICAL SPINE WITHOUT CONTRAST TECHNIQUE: Multidetector CT imaging of the head, cervical spine, and maxillofacial structures were performed using the standard protocol without intravenous contrast. Multiplanar CT image reconstructions of the cervical spine and maxillofacial structures were also generated. COMPARISON:  Head CT dated 07/23/2007 FINDINGS: CT HEAD FINDINGS Brain: The ventricles and sulci appropriate size for patient's age. The gray-white matter discrimination is preserved. There is no acute intracranial hemorrhage. No mass effect or midline shift. No extra-axial fluid collection. Vascular: No hyperdense vessel or unexpected calcification. Skull: Normal. Negative for fracture or focal lesion. Other: None. CT MAXILLOFACIAL FINDINGS Osseous: No fracture or mandibular dislocation. No destructive process. Orbits: Negative. No traumatic or inflammatory finding. Sinuses: Clear. Soft tissues: Negative. CT CERVICAL SPINE FINDINGS Alignment: No acute subluxation. Straightening of normal cervical lordosis which may be positional or due to muscle spasm. Skull base and vertebrae: No acute fracture. Soft tissues and spinal canal: No prevertebral fluid or swelling. No visible canal hematoma. Disc levels:  Degenerative changes at C5-C6. Upper chest: Negative. Other: None IMPRESSION: 1. No acute intracranial pathology. 2. No acute facial bone fractures. 3. No acute/traumatic cervical spine pathology. Electronically Signed   By: Anner Crete M.D.   On: 05/13/2018 23:40   Ct Chest W Contrast  Result Date: 05/14/2018 CLINICAL DATA:  67 year old female with trauma. EXAM: CT CHEST, ABDOMEN, AND PELVIS WITH CONTRAST TECHNIQUE: Multidetector CT imaging of the chest, abdomen and pelvis was performed following the standard protocol during bolus administration of intravenous contrast.  CONTRAST:  158m ISOVUE-300 IOPAMIDOL (ISOVUE-300) INJECTION 61% COMPARISON:  CT of the abdomen pelvis dated 12/02/2016 FINDINGS: CT CHEST FINDINGS Cardiovascular: There is mild cardiomegaly. No pericardial effusion. Multi vessel coronary vascular calcification. Mild atherosclerotic calcification of the thoracic aorta. No aneurysmal dilatation or dissection. The visualized origins of the great vessels of the aortic arch are patent. The central pulmonary arteries are grossly unremarkable. Mediastinum/Nodes: No hilar or mediastinal adenopathy. Esophagus and thyroid gland are grossly unremarkable. No mediastinal fluid collection. Lungs/Pleura: Bibasilar linear atelectasis/scarring. The lungs are clear. There is no pleural effusion or pneumothorax. The central airways are patent. Musculoskeletal: No chest wall mass or suspicious bone lesions identified. CT ABDOMEN PELVIS FINDINGS No intra-abdominal free air or free fluid. Hepatobiliary: The liver is unremarkable. Cholecystectomy. Mild intrahepatic biliary ductal dilatation. Pancreas: Unremarkable. No pancreatic ductal dilatation or surrounding inflammatory changes. Spleen: Normal in size without focal abnormality. Adrenals/Urinary Tract: The adrenal glands are unremarkable. Small nonobstructing right renal inferior pole hypodense focus which is too small to characterize. There is no hydronephrosis on either side. There is symmetric enhancement and excretion of contrast by both kidneys. The visualized ureters and urinary bladder appear unremarkable. Stomach/Bowel: There is sigmoid diverticulosis without active inflammatory changes. A gastric lap band is noted. There is no bowel obstruction or active inflammation. The appendix is not visualized with certainty. No inflammatory changes identified in the right lower quadrant. Vascular/Lymphatic: Moderate aortoiliac atherosclerotic disease. No portal venous gas. There is no adenopathy. Reproductive: The uterus is anteverted  and grossly unremarkable. Other: None Musculoskeletal: Grade 1 L4-L5 anterolisthesis. No acute osseous pathology. IMPRESSION: No acute intrathoracic, abdominal, or pelvic pathology. Electronically Signed   By: AAnner CreteM.D.   On: 05/14/2018 02:42   Ct Cervical Spine Wo Contrast  Result Date: 05/13/2018 CLINICAL DATA:  67year old female with facial trauma. EXAM: CT HEAD WITHOUT CONTRAST CT MAXILLOFACIAL WITHOUT CONTRAST CT CERVICAL SPINE WITHOUT CONTRAST TECHNIQUE: Multidetector CT imaging of the head, cervical spine, and maxillofacial structures were performed using the standard protocol without intravenous contrast. Multiplanar CT image reconstructions of the cervical spine and maxillofacial structures were also generated. COMPARISON:  Head CT dated 07/23/2007 FINDINGS: CT HEAD FINDINGS Brain: The ventricles and sulci appropriate size for patient's age. The gray-white matter discrimination is preserved. There is no acute intracranial hemorrhage. No mass effect or midline shift. No extra-axial fluid collection. Vascular: No hyperdense vessel or unexpected calcification. Skull: Normal. Negative for fracture or focal lesion. Other: None. CT MAXILLOFACIAL FINDINGS Osseous: No fracture or mandibular dislocation. No destructive process. Orbits: Negative. No traumatic or inflammatory finding. Sinuses: Clear. Soft tissues: Negative. CT CERVICAL SPINE FINDINGS Alignment: No acute subluxation. Straightening of normal cervical lordosis which may be positional or due to muscle spasm. Skull base and vertebrae: No acute fracture. Soft tissues and spinal canal: No prevertebral fluid or swelling. No visible canal hematoma. Disc levels:  Degenerative changes at C5-C6. Upper chest: Negative. Other: None IMPRESSION: 1. No acute intracranial pathology. 2. No acute facial bone fractures. 3. No acute/traumatic cervical spine pathology. Electronically Signed   By: AAnner CreteM.D.   On: 05/13/2018 23:40   Ct Knee  Right Wo Contrast  Result Date: 05/14/2018 CLINICAL DATA:  67year old female with fall and right knee pain. EXAM: CT OF THE right KNEE WITHOUT CONTRAST TECHNIQUE: Multidetector CT imaging of the right knee was performed according to the standard protocol. Multiplanar CT image reconstructions were also generated. COMPARISON:  Earlier radiograph dated 05/13/2018 FINDINGS: Bones/Joint/Cartilage There is a comminuted intra-articular fracture of the  proximal tibia with mild depression of the lateral tibial plateau. The bones are osteopenic. There is no dislocation. There is a large suprapatellar lipohemarthrosis. Ligaments Suboptimally assessed by CT. Muscles and Tendons No acute findings. No intramuscular hematoma. Soft tissues There is diffuse subcutaneous stranding of the lateral knee. No fluid collection. IMPRESSION: 1. Comminuted intra-articular fracture of the proximal tibia with mild depression of the lateral tibial plateau. 2. Large suprapatellar lipohemarthrosis. Electronically Signed   By: Anner Crete M.D.   On: 05/14/2018 02:33   Ct Abdomen Pelvis W Contrast  Result Date: 05/14/2018 CLINICAL DATA:  67 year old female with trauma. EXAM: CT CHEST, ABDOMEN, AND PELVIS WITH CONTRAST TECHNIQUE: Multidetector CT imaging of the chest, abdomen and pelvis was performed following the standard protocol during bolus administration of intravenous contrast. CONTRAST:  123m ISOVUE-300 IOPAMIDOL (ISOVUE-300) INJECTION 61% COMPARISON:  CT of the abdomen pelvis dated 12/02/2016 FINDINGS: CT CHEST FINDINGS Cardiovascular: There is mild cardiomegaly. No pericardial effusion. Multi vessel coronary vascular calcification. Mild atherosclerotic calcification of the thoracic aorta. No aneurysmal dilatation or dissection. The visualized origins of the great vessels of the aortic arch are patent. The central pulmonary arteries are grossly unremarkable. Mediastinum/Nodes: No hilar or mediastinal adenopathy. Esophagus and  thyroid gland are grossly unremarkable. No mediastinal fluid collection. Lungs/Pleura: Bibasilar linear atelectasis/scarring. The lungs are clear. There is no pleural effusion or pneumothorax. The central airways are patent. Musculoskeletal: No chest wall mass or suspicious bone lesions identified. CT ABDOMEN PELVIS FINDINGS No intra-abdominal free air or free fluid. Hepatobiliary: The liver is unremarkable. Cholecystectomy. Mild intrahepatic biliary ductal dilatation. Pancreas: Unremarkable. No pancreatic ductal dilatation or surrounding inflammatory changes. Spleen: Normal in size without focal abnormality. Adrenals/Urinary Tract: The adrenal glands are unremarkable. Small nonobstructing right renal inferior pole hypodense focus which is too small to characterize. There is no hydronephrosis on either side. There is symmetric enhancement and excretion of contrast by both kidneys. The visualized ureters and urinary bladder appear unremarkable. Stomach/Bowel: There is sigmoid diverticulosis without active inflammatory changes. A gastric lap band is noted. There is no bowel obstruction or active inflammation. The appendix is not visualized with certainty. No inflammatory changes identified in the right lower quadrant. Vascular/Lymphatic: Moderate aortoiliac atherosclerotic disease. No portal venous gas. There is no adenopathy. Reproductive: The uterus is anteverted and grossly unremarkable. Other: None Musculoskeletal: Grade 1 L4-L5 anterolisthesis. No acute osseous pathology. IMPRESSION: No acute intrathoracic, abdominal, or pelvic pathology. Electronically Signed   By: AAnner CreteM.D.   On: 05/14/2018 02:42   Ct Ankle Right Wo Contrast  Result Date: 05/14/2018 CLINICAL DATA:  67year old female with fall and right ankle pain. EXAM: CT OF THE RIGHT ANKLE WITHOUT CONTRAST TECHNIQUE: Multidetector CT imaging of the right ankle was performed according to the standard protocol. Multiplanar CT image  reconstructions were also generated. COMPARISON:  Right ankle radiograph dated 05/13/2018 FINDINGS: Bones/Joint/Cartilage There is a comminuted fracture of the distal fibula with extension of the fracture into the distal tibia-fibular syndesmosis. There is a minimally displaced fracture of the lateral aspect of the tibial plafond and nondisplaced fracture of the posterior malleolus. There is a minimally displaced fracture of the medial malleolus. There bones are osteopenic. Discontinuity of the plantar cortex of the medial cuneiform (series 5, image 58 and sagittal series 6, image 89) concerning for an avulsion fracture. There is also linear lucency through the plantar base of the first metatarsal (series 6, image 90) also concerning for an avulsion corner fracture. Probable nondisplaced fracture of the dorsal aspect of  the medial cuneiform (series 4, image 146 and sagittal series 6, image 69). Fracture of the base of the second metatarsal (series 6, image 73 and series 4, image 161). Nondisplaced linear lucency through the proximal fourth metatarsal (series 6, image 58) may be artifactual or represent a vascular groove or a nondisplaced fracture. No dislocation. Diffuse soft tissue edema. Ligaments Suboptimally assessed by CT. Muscles and Tendons No acute intramuscular findings. Soft tissues Diffuse subcutaneous edema. IMPRESSION: 1. Comminuted fracture of the distal fibula with extension of the fracture into the distal tibia-fibular syndesmosis. 2. Minimally displaced fracture of the lateral aspect of the tibial plafond and nondisplaced fracture of the posterior malleolus. 3. Minimally displaced fracture of the medial malleolus. 4. Nondisplaced fracture of the base of the second metatarsal. 5. Linear lucency through the plantar cortex of the medial cuneiform concerning for an avulsion fracture. 6. Linear lucency through the proximal fourth metatarsal may be artifactual or represent a nondisplaced fracture.  Electronically Signed   By: Anner Crete M.D.   On: 05/14/2018 02:55   Dg Shoulder Left  Result Date: 05/14/2018 CLINICAL DATA:  General left shoulder pain.  Pain after fall. EXAM: LEFT SHOULDER - 2+ VIEW COMPARISON:  None. FINDINGS: There is no evidence of fracture or dislocation. There is no evidence of arthropathy or other focal bone abnormality. Soft tissues are unremarkable. IMPRESSION: Negative. Electronically Signed   By: Dorise Bullion III M.D   On: 05/14/2018 00:07   Dg Hand Complete Right  Result Date: 05/14/2018 CLINICAL DATA:  67 year old female with fall and trauma to the right hand. EXAM: RIGHT HAND - COMPLETE 3+ VIEW COMPARISON:  Right wrist radiograph dated 05/13/2018 FINDINGS: Comminuted mildly displaced intra-articular fracture of the distal radius as seen on the earlier wrist radiograph. No other acute fracture noted. The bones are osteopenic. No dislocation. Soft tissue swelling of the wrist. IMPRESSION: Comminuted mildly displaced intra-articular fracture of the distal radius. Electronically Signed   By: Anner Crete M.D.   On: 05/14/2018 01:09   Ct Maxillofacial Wo Contrast  Result Date: 05/13/2018 CLINICAL DATA:  67 year old female with facial trauma. EXAM: CT HEAD WITHOUT CONTRAST CT MAXILLOFACIAL WITHOUT CONTRAST CT CERVICAL SPINE WITHOUT CONTRAST TECHNIQUE: Multidetector CT imaging of the head, cervical spine, and maxillofacial structures were performed using the standard protocol without intravenous contrast. Multiplanar CT image reconstructions of the cervical spine and maxillofacial structures were also generated. COMPARISON:  Head CT dated 07/23/2007 FINDINGS: CT HEAD FINDINGS Brain: The ventricles and sulci appropriate size for patient's age. The gray-white matter discrimination is preserved. There is no acute intracranial hemorrhage. No mass effect or midline shift. No extra-axial fluid collection. Vascular: No hyperdense vessel or unexpected calcification. Skull:  Normal. Negative for fracture or focal lesion. Other: None. CT MAXILLOFACIAL FINDINGS Osseous: No fracture or mandibular dislocation. No destructive process. Orbits: Negative. No traumatic or inflammatory finding. Sinuses: Clear. Soft tissues: Negative. CT CERVICAL SPINE FINDINGS Alignment: No acute subluxation. Straightening of normal cervical lordosis which may be positional or due to muscle spasm. Skull base and vertebrae: No acute fracture. Soft tissues and spinal canal: No prevertebral fluid or swelling. No visible canal hematoma. Disc levels:  Degenerative changes at C5-C6. Upper chest: Negative. Other: None IMPRESSION: 1. No acute intracranial pathology. 2. No acute facial bone fractures. 3. No acute/traumatic cervical spine pathology. Electronically Signed   By: Anner Crete M.D.   On: 05/13/2018 23:40    Review of Systems  Constitutional: Negative.   HENT: Negative.   Eyes: Negative.  Respiratory: Negative.   Cardiovascular: Negative.   Gastrointestinal: Negative.   Musculoskeletal:       Right wrist pain, right ankle pain, right knee pain.  Skin: Negative.   Neurological: Negative.   Psychiatric/Behavioral: Negative.    Blood pressure 139/62, pulse 90, temperature 99.1 F (37.3 C), temperature source Oral, resp. rate 15, height '5\' 3"'  (1.6 m), weight 100.3 kg, SpO2 98 %. Physical Exam  Constitutional: She appears well-nourished.  HENT:  Head: Normocephalic.  Eyes: Conjunctivae are normal.  Neck: Neck supple.  Respiratory: Effort normal.  GI: Soft.  Musculoskeletal:  Patient right wrist is in the splint.  She is able to wiggle her fingers.  She endorses sensation in the median, ulnar, radial nerve distribution of the digits.  No tenderness palpation at the elbow.  She is able to move the elbow.  Is able to move her right shoulder without difficulty.  No evidence of shoulder or elbow trauma.  Left upper extremity there is evidence of ecchymosis to the dorsal lateral aspect of  the shoulder over the deltoid muscle.  She is able to move her shoulder without pain.  No tenderness over the clavicle.  No tenderness palpation about the elbow wrist or hand.  Motor intact median, ulnar, radial nerve distribution.  Sensation intact in those nerve distributions as well.  Right lower extremity in a knee immobilizer.  This was removed.  There is swelling about the knee.  Some ecchymosis.  Knee alignment appears well-maintained.  Proximal leg compartments are soft.  No significant pain with calf squeeze.  No evidence of compartment syndrome.  She is in a short leg splint.  She is able to wiggle her toes albeit slowly and painfully.  She endorses sensation intact light touch about the toes.  Toes are warm and well-perfused.  No evidence of injury to the left lower extremity.  Neurological: She is alert.  Skin: Skin is warm.  Psychiatric: She has a normal mood and affect.    Assessment/Plan: 67 year old female with right dorsal distal radius fracture, right lateral tibial plateau fracture with some joint depression, right trimalleolar ankle fracture, right cuneiform and metatarsal base fractures.  She certainly is in a tough position with these fractures. Given her history of lupus and rheumatoid arthritis and current medication treatment for these this places her at a higher risk for wound complications and infections.  As does her morbid obesity. I will discuss her case with the orthopedic trauma team and try to coordinate care with them.  There may be a chance that we can treat some of her fractures nonoperatively.   Currently she is nonweightbearing on the right upper and lower extremities.    I have ordered post splint films of the right wrist and right ankle to better evaluate the fracture is now that they are immobilized.  This will help determine whether they will require surgical fixation.  Hold Plavix until surgical plan established.  Addendum: 1:06pm  I have discussed  patient with Dr. Doreatha Martin from Ortho Trauma team and he recommends surgery fir her injuries.  Given the nature and complexity of her injuries he will assume her care beginning 05/15/18.  Okay for diet now.  Will be NPO past midnight for anticipated surgery tomorrow.   Erle Crocker 05/14/2018, 8:06 AM

## 2018-05-14 NOTE — ED Notes (Signed)
Attempted to call report to the floor. 

## 2018-05-14 NOTE — Consult Note (Addendum)
Activation and Reason: Non-level trauma transfer from [facility] - 66yoF s/p fall down stairs  Primary Survey:  Airway: Intact, talking Breathing: Spontaneous; BS bilaterally  Circulation: Palpable pulses in all 4 ext Disability: GCS 15  Cindy Holmes is an 67 y.o. female with hx of HTN, HLD, RA, Lupus (on chronic prednisone), PAD, CAD (s/p stents now on aspirin+plavix), ?afib, grade 2 diastolic HF, stroke, carotid artery dz whom presented to the ED at [facility] earlier this evening following fall down 6 steps. She denies any LOC. She complains of pain in her right forearm and L knee+ankle. She denies pain in her head, face, neck, back, chest, abdomen, pelvis, LUE, LLE.  Past Medical History:  Diagnosis Date  . CAD (coronary artery disease)    a. PTCA LAD 2001/cath 2002-prox and mid LAD stents patent, PTCA ostium Dx 1 b. cath 03/2016: patent LAD stent with less than 40% in-stent restenosis, 10-30% stenosis of D1 and distal LAD with continued medical management recommended.  . Candida esophagitis ([place])   . Carotid artery disease ([place])    Doppler September, 2011, 0-39% bilateral disease mild  . Chronic diastolic CHF (congestive heart failure) ([place])   . Edema   . Emotional stress reaction    8/11  . HTN (hypertension)   . Hyperlipidemia   . Leg pain    July, 2012, Arterial Dopplers March 08, 2011 normal  . 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 PONV (postoperative nausea and vomiting)   . Rheumatoid arthritis ([place])   . Right rotator cuff tear 11/16/2013  . Stroke [facility])    mini stroke in past ???    Past Surgical History:  Procedure Laterality Date  . CARDIAC CATHETERIZATION     with stent placement in 2001  . CARDIAC CATHETERIZATION N/A 03/12/2016   Procedure: Left Heart Cath and Coronary Angiography;  Surgeon: Nelva Bush, MD;  Location: [place] CV  LAB;  Service: Cardiovascular;  Laterality: N/A;  . CARDIAC CATHETERIZATION N/A 03/12/2016   Procedure: Intravascular Pressure Wire/FFR Study;  Surgeon: Nelva Bush, MD;  Location: [place] [facility] LAB;  Service: Cardiovascular;  Laterality: N/A;  . CHOLECYSTECTOMY    . CORONARY ANGIOPLASTY     2002  . ESOPHAGOGASTRODUODENOSCOPY N/A 12/03/2016   Procedure: ESOPHAGOGASTRODUODENOSCOPY (EGD);  Surgeon: Carol Ada, MD;  Location: [place];  Service: Endoscopy;  Laterality: N/A;  . laproscopic adjustable gastric  banding     with APS standard system.   . Pt has 3 stents     sept 2001  . SHOULDER ARTHROSCOPY WITH SUBACROMIAL DECOMPRESSION, ROTATOR CUFF REPAIR AND BICEP TENDON REPAIR Right 11/16/2013   Procedure: RIGHT SHOULDER ARTHROSCOPY, EXTENSIVE DEBRIDEMENT, ROTATOR CUFF REPAIR ;  Surgeon: [name], MD;  Location: [place];  Service: Orthopedics;  Laterality: Right;  . TUBAL LIGATION      Family History  Problem Relation Age of Onset  . Heart attack Mother   . Heart disease Mother   . Diabetes Brother   . Heart disease Brother   . Diabetes Sister   . Arthritis/Rheumatoid Father   . Cirrhosis Father        hepatic cirrhosis  . Heart disease Maternal Aunt   . Heart attack Maternal Aunt   . Throat cancer Maternal Aunt   . Heart attack Maternal Uncle   . Heart disease Brother   . Cancer  Unknown        family history  . Heart attack Unknown        family history  . Colon cancer Maternal Grandmother   . Throat cancer Maternal Grandfather     Social History:  reports that she has never smoked. She has never used smokeless tobacco. She reports that she does not drink alcohol or use drugs.  Allergies:  Allergies  Allergen Reactions  . Codeine Phosphate Shortness Of Breath    REACTION: unspecified  . Amoxicillin Nausea And Vomiting    REACTION: unspecified  . Penicillins Nausea And Vomiting    Has patient had a PCN reaction causing immediate rash,  facial/tongue/throat swelling, SOB or lightheadedness with hypotension:YES Has patient had a PCN reaction causing severe rash involving mucus membranes or skin necrosis: NO Has patient had a PCN reaction that required hospitalization NO Has patient had a PCN reaction occurring within the last 10 years: NO If all of the above answers are "NO", then may proceed with Cephalosporin use.  Marland Kitchen Morphine And Related Nausea And Vomiting  . Lidocaine Rash    Patch caused rash    Medications: I have reviewed the patient's current medications.  Results for orders placed or performed during the hospital encounter of 05/13/18 (from the past 48 hour(s))  CBC with Differential/Platelet     Status: Abnormal   Collection Time: 05/14/18 12:50 AM  Result Value Ref Range   WBC 18.7 (H) 4.0 - 10.5 K/uL   RBC 3.74 (L) 3.87 - 5.11 MIL/uL   Hemoglobin 11.2 (L) 12.0 - 15.0 g/dL   HCT 35.4 (L) 36.0 - 46.0 %   MCV 94.7 80.0 - 100.0 fL   MCH 29.9 26.0 - 34.0 pg   MCHC 31.6 30.0 - 36.0 g/dL   RDW 14.7 11.5 - 15.5 %   Platelets 317 150 - 400 K/uL   nRBC 0.0 0.0 - 0.2 %   Neutrophils Relative % 85 %   Neutro Abs 15.7 (H) 1.7 - 7.7 K/uL   Lymphocytes Relative 9 %   Lymphs Abs 1.6 0.7 - 4.0 K/uL   Monocytes Relative 6 %   Monocytes Absolute 1.2 (H) 0.1 - 1.0 K/uL   Eosinophils Relative 0 %   Eosinophils Absolute 0.1 0.0 - 0.5 K/uL   Basophils Relative 0 %   Basophils Absolute 0.1 0.0 - 0.1 K/uL   Immature Granulocytes 0 %   Abs Immature Granulocytes 0.08 (H) 0.00 - 0.07 K/uL    Comment: Performed at [facility], [address] [place], [place] [address]  Basic metabolic panel     Status: Abnormal   Collection Time: 05/14/18 12:50 AM  Result Value Ref Range   Sodium 135 135 - 145 mmol/L   Potassium 3.4 (L) 3.5 - 5.1 mmol/L   Chloride 101 98 - 111 mmol/L   CO2 25 22 - 32 mmol/L   Glucose, Bld 142 (H) 70 - 99 mg/dL   BUN 14 8 - 23 mg/dL   Creatinine, Ser 0.66 0.44 - 1.00 mg/dL   Calcium 8.8 (L) 8.9 - 10.3  mg/dL   GFR calc non Af Amer >60 >60 mL/min   GFR calc Af Amer >60 >60 mL/min    Comment: (NOTE) The eGFR has been calculated using the CKD EPI equation. This calculation has not been validated in all clinical situations. eGFR's persistently <60 mL/min signify possible Chronic Kidney Disease.    Anion gap 9 5 - 15    Comment: Performed at [facility], [address], [place], [place] [address]    Dg Chest 2 View  Result Date: 05/14/2018 CLINICAL DATA:  67 year old female with fall. History of CHF. EXAM: CHEST - 2 VIEW COMPARISON:  Chest CT dated 05/14/2018 FINDINGS: There is cardiomegaly with probable mild vascular congestion. No focal consolidation, pleural effusion, or pneumothorax. No acute osseous pathology. IMPRESSION: Cardiomegaly with probable mild vascular congestion. No focal consolidation. Electronically Signed   By: Anner Crete M.D.   On: 05/14/2018 01:10   Dg Pelvis 1-2 Views  Result Date: 05/14/2018 CLINICAL DATA:  Fall EXAM: PELVIS - 1-2 VIEW COMPARISON:  None. FINDINGS: There is no evidence of pelvic fracture or diastasis. No pelvic bone lesions are seen. IMPRESSION: Negative. Electronically Signed   By: Kerby Moors M.D.   On: 05/14/2018 01:10   Dg Forearm Right  Result Date: 05/14/2018 CLINICAL DATA:  Fall.  Right wrist pain. EXAM: RIGHT FOREARM - 2 VIEW COMPARISON:  None. FINDINGS: There is a an acute intra-articular fracture deformity involving the distal radius. Dorsal angulation of the distal fracture fragments noted. No dislocation identified. IMPRESSION: 1. Acute fracture involves the dorsal aspect of the distal radius. Mild dorsal angulation of the distal fracture fragments. Electronically Signed   By: Kerby Moors M.D.   On: 05/14/2018 01:07   Dg Wrist Complete Left  Result Date: 05/14/2018 CLINICAL DATA:  67 year old female with fall and left wrist pain. EXAM: LEFT WRIST - COMPLETE 3+ VIEW COMPARISON:  None. FINDINGS: There is no acute fracture or  dislocation. The bones are osteopenic. No significant arthritic changes. There is soft tissue swelling over the wrist and ulna. No radiopaque foreign object or soft tissue gas. IMPRESSION: No acute fracture or dislocation. Electronically Signed   By: Anner Crete M.D.   On: 05/14/2018 00:13   Dg Wrist Complete Right  Result Date: 05/14/2018 CLINICAL DATA:  Pain after fall EXAM: RIGHT WRIST - COMPLETE 3+ VIEW COMPARISON:  None. FINDINGS: There is a comminuted mildly displaced fracture through the distal radial metaphysis. IMPRESSION: Comminuted mildly displaced fracture through the distal radial metaphysis. Electronically Signed   By: Dorise Bullion III M.D   On: 05/14/2018 00:12   Dg Knee 2 Views Right  Result Date: 05/14/2018 CLINICAL DATA:  Pain after fall EXAM: RIGHT KNEE - 1-2 VIEW COMPARISON:  None. FINDINGS: A hemarthrosis is identified. There is a depressed lateral tibial plateau fracture which is comminuted. IMPRESSION: Comminuted mildly depressed lateral tibial plateau fracture with resulting hemarthrosis. Electronically Signed   By: Dorise Bullion III M.D   On: 05/14/2018 00:08   Dg Tibia/fibula Right  Result Date: 05/14/2018 CLINICAL DATA:  Pain after fall EXAM: RIGHT TIBIA AND FIBULA - 2 VIEW COMPARISON:  None. FINDINGS: There is a comminuted depressed fracture through the lateral tibial plateau. There is a comminuted fracture through the distal fibular diaphysis. There is a lucency through the distal medial malleolus with a step-off. These hemarthrosis in the suprapatellar fossa. IMPRESSION: 1. Depressed comminuted lateral tibial plateau fracture. 2. Comminuted fracture through the distal fibular diaphysis. 3. Fracture through the distal medial malleolus. Electronically Signed   By: Dorise Bullion III M.D   On: 05/14/2018 00:14   Dg Ankle Complete Right  Result Date: 05/14/2018 CLINICAL DATA:  Pain after fall EXAM: RIGHT ANKLE - COMPLETE 3+ VIEW COMPARISON:  None. FINDINGS: There  is a comminuted fracture through the distal fibular diaphysis. There is a fracture through the medial malleolus. There also be appears to be a fracture through the  distal tibia where it abuts the fibula. Irregularity of the distal most fibula is consistent with an age indeterminate fracture but may not be acute. Soft tissue swelling. IMPRESSION: 1. Fracture through the medial malleolus. 2. Apparent fracture through the lateral aspect of the distal tibia with displacement. 3. Fracture through the fibular diaphysis distally. 4. Irregularity of the distal tip of the fibula may be nonacute from previous injury. 5. No definitive posterior malleolar fracture. However, given the complex nature of the ankle fracture, consider a CT scan for better evaluation. Electronically Signed   By: Dorise Bullion III M.D   On: 05/14/2018 00:17   Ct Head Wo Contrast  Result Date: 05/13/2018 CLINICAL DATA:  67 year old female with facial trauma. EXAM: CT HEAD WITHOUT CONTRAST CT MAXILLOFACIAL WITHOUT CONTRAST CT CERVICAL SPINE WITHOUT CONTRAST TECHNIQUE: Multidetector CT imaging of the head, cervical spine, and maxillofacial structures were performed using the standard protocol without intravenous contrast. Multiplanar CT image reconstructions of the cervical spine and maxillofacial structures were also generated. COMPARISON:  Head CT dated 07/23/2007 FINDINGS: CT HEAD FINDINGS Brain: The ventricles and sulci appropriate size for patient's age. The gray-Bartley Vuolo matter discrimination is preserved. There is no acute intracranial hemorrhage. No mass effect or midline shift. No extra-axial fluid collection. Vascular: No hyperdense vessel or unexpected calcification. Skull: Normal. Negative for fracture or focal lesion. Other: None. CT MAXILLOFACIAL FINDINGS Osseous: No fracture or mandibular dislocation. No destructive process. Orbits: Negative. No traumatic or inflammatory finding. Sinuses: Clear. Soft tissues: Negative. CT CERVICAL SPINE  FINDINGS Alignment: No acute subluxation. Straightening of normal cervical lordosis which may be positional or due to muscle spasm. Skull base and vertebrae: No acute fracture. Soft tissues and spinal canal: No prevertebral fluid or swelling. No visible canal hematoma. Disc levels:  Degenerative changes at C5-C6. Upper chest: Negative. Other: None IMPRESSION: 1. No acute intracranial pathology. 2. No acute facial bone fractures. 3. No acute/traumatic cervical spine pathology. Electronically Signed   By: Anner Crete M.D.   On: 05/13/2018 23:40   Ct Chest W Contrast  Result Date: 05/14/2018 CLINICAL DATA:  67 year old female with trauma. EXAM: CT CHEST, ABDOMEN, AND PELVIS WITH CONTRAST TECHNIQUE: Multidetector CT imaging of the chest, abdomen and pelvis was performed following the standard protocol during bolus administration of intravenous contrast. CONTRAST:  168m ISOVUE-300 IOPAMIDOL (ISOVUE-300) INJECTION 61% COMPARISON:  CT of the abdomen pelvis dated 12/02/2016 FINDINGS: CT CHEST FINDINGS Cardiovascular: There is mild cardiomegaly. No pericardial effusion. Multi vessel coronary vascular calcification. Mild atherosclerotic calcification of the thoracic aorta. No aneurysmal dilatation or dissection. The visualized origins of the great vessels of the aortic arch are patent. The central pulmonary arteries are grossly unremarkable. Mediastinum/Nodes: No hilar or mediastinal adenopathy. Esophagus and thyroid gland are grossly unremarkable. No mediastinal fluid collection. Lungs/Pleura: Bibasilar linear atelectasis/scarring. The lungs are clear. There is no pleural effusion or pneumothorax. The central airways are patent. Musculoskeletal: No chest wall mass or suspicious bone lesions identified. CT ABDOMEN PELVIS FINDINGS No intra-abdominal free air or free fluid. Hepatobiliary: The liver is unremarkable. Cholecystectomy. Mild intrahepatic biliary ductal dilatation. Pancreas: Unremarkable. No pancreatic  ductal dilatation or surrounding inflammatory changes. Spleen: Normal in size without focal abnormality. Adrenals/Urinary Tract: The adrenal glands are unremarkable. Small nonobstructing right renal inferior pole hypodense focus which is too small to characterize. There is no hydronephrosis on either side. There is symmetric enhancement and excretion of contrast by both kidneys. The visualized ureters and urinary bladder appear unremarkable. Stomach/Bowel: There is sigmoid diverticulosis without active inflammatory  changes. A gastric lap band is noted. There is no bowel obstruction or active inflammation. The appendix is not visualized with certainty. No inflammatory changes identified in the right lower quadrant. Vascular/Lymphatic: Moderate aortoiliac atherosclerotic disease. No portal venous gas. There is no adenopathy. Reproductive: The uterus is anteverted and grossly unremarkable. Other: None Musculoskeletal: Grade 1 L4-L5 anterolisthesis. No acute osseous pathology. IMPRESSION: No acute intrathoracic, abdominal, or pelvic pathology. Electronically Signed   By: Anner Crete M.D.   On: 05/14/2018 02:42   Ct Cervical Spine Wo Contrast  Result Date: 05/13/2018 CLINICAL DATA:  67 year old female with facial trauma. EXAM: CT HEAD WITHOUT CONTRAST CT MAXILLOFACIAL WITHOUT CONTRAST CT CERVICAL SPINE WITHOUT CONTRAST TECHNIQUE: Multidetector CT imaging of the head, cervical spine, and maxillofacial structures were performed using the standard protocol without intravenous contrast. Multiplanar CT image reconstructions of the cervical spine and maxillofacial structures were also generated. COMPARISON:  Head CT dated 07/23/2007 FINDINGS: CT HEAD FINDINGS Brain: The ventricles and sulci appropriate size for patient's age. The gray-Norva Bowe matter discrimination is preserved. There is no acute intracranial hemorrhage. No mass effect or midline shift. No extra-axial fluid collection. Vascular: No hyperdense vessel or  unexpected calcification. Skull: Normal. Negative for fracture or focal lesion. Other: None. CT MAXILLOFACIAL FINDINGS Osseous: No fracture or mandibular dislocation. No destructive process. Orbits: Negative. No traumatic or inflammatory finding. Sinuses: Clear. Soft tissues: Negative. CT CERVICAL SPINE FINDINGS Alignment: No acute subluxation. Straightening of normal cervical lordosis which may be positional or due to muscle spasm. Skull base and vertebrae: No acute fracture. Soft tissues and spinal canal: No prevertebral fluid or swelling. No visible canal hematoma. Disc levels:  Degenerative changes at C5-C6. Upper chest: Negative. Other: None IMPRESSION: 1. No acute intracranial pathology. 2. No acute facial bone fractures. 3. No acute/traumatic cervical spine pathology. Electronically Signed   By: Anner Crete M.D.   On: 05/13/2018 23:40   Ct Knee Right Wo Contrast  Result Date: 05/14/2018 CLINICAL DATA:  67 year old female with fall and right knee pain. EXAM: CT OF THE right KNEE WITHOUT CONTRAST TECHNIQUE: Multidetector CT imaging of the right knee was performed according to the standard protocol. Multiplanar CT image reconstructions were also generated. COMPARISON:  Earlier radiograph dated 05/13/2018 FINDINGS: Bones/Joint/Cartilage There is a comminuted intra-articular fracture of the proximal tibia with mild depression of the lateral tibial plateau. The bones are osteopenic. There is no dislocation. There is a large suprapatellar lipohemarthrosis. Ligaments Suboptimally assessed by CT. Muscles and Tendons No acute findings. No intramuscular hematoma. Soft tissues There is diffuse subcutaneous stranding of the lateral knee. No fluid collection. IMPRESSION: 1. Comminuted intra-articular fracture of the proximal tibia with mild depression of the lateral tibial plateau. 2. Large suprapatellar lipohemarthrosis. Electronically Signed   By: Anner Crete M.D.   On: 05/14/2018 02:33   Ct Abdomen  Pelvis W Contrast  Result Date: 05/14/2018 CLINICAL DATA:  67 year old female with trauma. EXAM: CT CHEST, ABDOMEN, AND PELVIS WITH CONTRAST TECHNIQUE: Multidetector CT imaging of the chest, abdomen and pelvis was performed following the standard protocol during bolus administration of intravenous contrast. CONTRAST:  165m ISOVUE-300 IOPAMIDOL (ISOVUE-300) INJECTION 61% COMPARISON:  CT of the abdomen pelvis dated 12/02/2016 FINDINGS: CT CHEST FINDINGS Cardiovascular: There is mild cardiomegaly. No pericardial effusion. Multi vessel coronary vascular calcification. Mild atherosclerotic calcification of the thoracic aorta. No aneurysmal dilatation or dissection. The visualized origins of the great vessels of the aortic arch are patent. The central pulmonary arteries are grossly unremarkable. Mediastinum/Nodes: No hilar or mediastinal adenopathy.  Esophagus and thyroid gland are grossly unremarkable. No mediastinal fluid collection. Lungs/Pleura: Bibasilar linear atelectasis/scarring. The lungs are clear. There is no pleural effusion or pneumothorax. The central airways are patent. Musculoskeletal: No chest wall mass or suspicious bone lesions identified. CT ABDOMEN PELVIS FINDINGS No intra-abdominal free air or free fluid. Hepatobiliary: The liver is unremarkable. Cholecystectomy. Mild intrahepatic biliary ductal dilatation. Pancreas: Unremarkable. No pancreatic ductal dilatation or surrounding inflammatory changes. Spleen: Normal in size without focal abnormality. Adrenals/Urinary Tract: The adrenal glands are unremarkable. Small nonobstructing right renal inferior pole hypodense focus which is too small to characterize. There is no hydronephrosis on either side. There is symmetric enhancement and excretion of contrast by both kidneys. The visualized ureters and urinary bladder appear unremarkable. Stomach/Bowel: There is sigmoid diverticulosis without active inflammatory changes. A gastric lap band is noted. There  is no bowel obstruction or active inflammation. The appendix is not visualized with certainty. No inflammatory changes identified in the right lower quadrant. Vascular/Lymphatic: Moderate aortoiliac atherosclerotic disease. No portal venous gas. There is no adenopathy. Reproductive: The uterus is anteverted and grossly unremarkable. Other: None Musculoskeletal: Grade 1 L4-L5 anterolisthesis. No acute osseous pathology. IMPRESSION: No acute intrathoracic, abdominal, or pelvic pathology. Electronically Signed   By: Anner Crete M.D.   On: 05/14/2018 02:42   Ct Ankle Right Wo Contrast  Result Date: 05/14/2018 CLINICAL DATA:  67 year old female with fall and right ankle pain. EXAM: CT OF THE RIGHT ANKLE WITHOUT CONTRAST TECHNIQUE: Multidetector CT imaging of the right ankle was performed according to the standard protocol. Multiplanar CT image reconstructions were also generated. COMPARISON:  Right ankle radiograph dated 05/13/2018 FINDINGS: Bones/Joint/Cartilage There is a comminuted fracture of the distal fibula with extension of the fracture into the distal tibia-fibular syndesmosis. There is a minimally displaced fracture of the lateral aspect of the tibial plafond and nondisplaced fracture of the posterior malleolus. There is a minimally displaced fracture of the medial malleolus. There bones are osteopenic. Discontinuity of the plantar cortex of the medial cuneiform (series 5, image 58 and sagittal series 6, image 89) concerning for an avulsion fracture. There is also linear lucency through the plantar base of the first metatarsal (series 6, image 90) also concerning for an avulsion corner fracture. Probable nondisplaced fracture of the dorsal aspect of the medial cuneiform (series 4, image 146 and sagittal series 6, image 69). Fracture of the base of the second metatarsal (series 6, image 73 and series 4, image 161). Nondisplaced linear lucency through the proximal fourth metatarsal (series 6, image 58)  may be artifactual or represent a vascular groove or a nondisplaced fracture. No dislocation. Diffuse soft tissue edema. Ligaments Suboptimally assessed by CT. Muscles and Tendons No acute intramuscular findings. Soft tissues Diffuse subcutaneous edema. IMPRESSION: 1. Comminuted fracture of the distal fibula with extension of the fracture into the distal tibia-fibular syndesmosis. 2. Minimally displaced fracture of the lateral aspect of the tibial plafond and nondisplaced fracture of the posterior malleolus. 3. Minimally displaced fracture of the medial malleolus. 4. Nondisplaced fracture of the base of the second metatarsal. 5. Linear lucency through the plantar cortex of the medial cuneiform concerning for an avulsion fracture. 6. Linear lucency through the proximal fourth metatarsal may be artifactual or represent a nondisplaced fracture. Electronically Signed   By: Anner Crete M.D.   On: 05/14/2018 02:55   Dg Shoulder Left  Result Date: 05/14/2018 CLINICAL DATA:  General left shoulder pain.  Pain after fall. EXAM: LEFT SHOULDER - 2+ VIEW COMPARISON:  None.  FINDINGS: There is no evidence of fracture or dislocation. There is no evidence of arthropathy or other focal bone abnormality. Soft tissues are unremarkable. IMPRESSION: Negative. Electronically Signed   By: Dorise Bullion III M.D   On: 05/14/2018 00:07   Dg Hand Complete Right  Result Date: 05/14/2018 CLINICAL DATA:  67 year old female with fall and trauma to the right hand. EXAM: RIGHT HAND - COMPLETE 3+ VIEW COMPARISON:  Right wrist radiograph dated 05/13/2018 FINDINGS: Comminuted mildly displaced intra-articular fracture of the distal radius as seen on the earlier wrist radiograph. No other acute fracture noted. The bones are osteopenic. No dislocation. Soft tissue swelling of the wrist. IMPRESSION: Comminuted mildly displaced intra-articular fracture of the distal radius. Electronically Signed   By: Anner Crete M.D.   On: 05/14/2018  01:09   Ct Maxillofacial Wo Contrast  Result Date: 05/13/2018 CLINICAL DATA:  67 year old female with facial trauma. EXAM: CT HEAD WITHOUT CONTRAST CT MAXILLOFACIAL WITHOUT CONTRAST CT CERVICAL SPINE WITHOUT CONTRAST TECHNIQUE: Multidetector CT imaging of the head, cervical spine, and maxillofacial structures were performed using the standard protocol without intravenous contrast. Multiplanar CT image reconstructions of the cervical spine and maxillofacial structures were also generated. COMPARISON:  Head CT dated 07/23/2007 FINDINGS: CT HEAD FINDINGS Brain: The ventricles and sulci appropriate size for patient's age. The gray-Eugenie Harewood matter discrimination is preserved. There is no acute intracranial hemorrhage. No mass effect or midline shift. No extra-axial fluid collection. Vascular: No hyperdense vessel or unexpected calcification. Skull: Normal. Negative for fracture or focal lesion. Other: None. CT MAXILLOFACIAL FINDINGS Osseous: No fracture or mandibular dislocation. No destructive process. Orbits: Negative. No traumatic or inflammatory finding. Sinuses: Clear. Soft tissues: Negative. CT CERVICAL SPINE FINDINGS Alignment: No acute subluxation. Straightening of normal cervical lordosis which may be positional or due to muscle spasm. Skull base and vertebrae: No acute fracture. Soft tissues and spinal canal: No prevertebral fluid or swelling. No visible canal hematoma. Disc levels:  Degenerative changes at C5-C6. Upper chest: Negative. Other: None IMPRESSION: 1. No acute intracranial pathology. 2. No acute facial bone fractures. 3. No acute/traumatic cervical spine pathology. Electronically Signed   By: Anner Crete M.D.   On: 05/13/2018 23:40    Review of Systems  Constitutional: Negative for chills and fever.  HENT: Positive for hearing loss. Negative for nosebleeds.        Chronic hearing loss  Eyes: Negative for blurred vision and double vision.  Respiratory: Negative for shortness of breath  and wheezing.   Cardiovascular: Positive for leg swelling. Negative for chest pain.  Gastrointestinal: Negative for abdominal pain, nausea and vomiting.  Genitourinary: Negative for flank pain and hematuria.  Musculoskeletal: Positive for falls, joint pain and myalgias. Negative for back pain and neck pain.       Per HPI  Skin: Negative for itching and rash.  Neurological: Negative for dizziness and headaches.  Psychiatric/Behavioral: Negative for depression and suicidal ideas.   Blood pressure (!) 133/56, pulse 95, temperature 98.6 F (37 C), temperature source Oral, resp. rate 13, height '5\' 3"'  (1.6 m), weight 93.9 kg, SpO2 92 %. Physical Exam  Constitutional: She is oriented to person, place, and time. She appears well-developed and well-nourished. No distress.  HENT:  Head: Normocephalic and atraumatic.  Right Ear: External ear normal.  Left Ear: External ear normal.  Nose: Nose normal.  Eyes: Pupils are equal, round, and reactive to light. Conjunctivae and EOM are normal.  Neck: Normal range of motion. Neck supple.  Cardiovascular:  HR appears irregular;  rhythm appears irregular as well  Respiratory: Effort normal and breath sounds normal. No respiratory distress. She has no wheezes.  GI: Soft. There is no tenderness. There is no rebound and no guarding.  Musculoskeletal:  RUE in splint; edema at level of wrist Ecchymosis on cephalad portion of left shoulder RLE in splint; significant swelling at ankle and foot Normal ROM LUE, LLE  Neurological: She is alert and oriented to person, place, and time.  Skin: Skin is warm and dry.  Psychiatric: She has a normal mood and affect. Her behavior is normal. Judgment and thought content normal.    INJURY SUMMARY: 1. Right distal radius fx 2. Right tibial plateau fx 3. Right distal tib/fib fx 4. Ecchymosis left shoulder  PLAN -She has undergone a complete trauma evaluation with the exception. Her only injuries identified have been  orthopedic -Dr. Lucia Gaskins was notified by both Drs. Rancour at [facility] and here by Dr. Sabra Heck - requested trauma evaluation which has been completed. -Would recommend admission to medicine service for assistance in management of her multiple medical comorbidities; we will circle back for a tertiary evaluation as well -?Afib at present - will defer to medicine whether she needs admission to monitored bed. EKG ordered -Will defer management decisions regarding ASA/Plavix to medicine and orthopedics  Sharon Mt. Dema Severin, M.D. [facility]. 05/14/2018, 3:33 AM

## 2018-05-14 NOTE — H&P (Signed)
History and Physical    Cindy MORRISSEY FYB:[id number] DOB: Jan 01, 1951 DOA: 05/13/2018  PCP: Redmond School, MD  Patient coming from: Home  I have personally briefly reviewed patient's old medical records in [place]  Chief Complaint: Fall down stairs  HPI: Cindy Holmes is a 67 y.o. female with medical history significant of RA, CAD s/p stents, chronic diastolic CHF, HTN, TIA.  Patient suffered a fall down stairs at home yesterday evening.  Initially presented to [facility] before being transferred to [facility].  Patient with severe pain in R wrist, R knee and R ankle pain.  Worse with palpation or movement.   ED Course: Found to have R distal radius fx, R tibial plateu fractures, R ankle fractures.  See the radiological studies for more details on the numerous broken bones.   Review of Systems: As per HPI otherwise 10 point review of systems negative.   Past Medical History:  Diagnosis Date  . CAD (coronary artery disease)    a. PTCA LAD 2001/cath 2002-prox and mid LAD stents patent, PTCA ostium Dx 1 b. cath 03/2016: patent LAD stent with less than 40% in-stent restenosis, 10-30% stenosis of D1 and distal LAD with continued medical management recommended.  . Candida esophagitis ([place])   . Carotid artery disease ([place])    Doppler September, 2011, 0-39% bilateral disease mild  . Chronic diastolic CHF (congestive heart failure) ([place])   . Edema   . Emotional stress reaction    8/11  . HTN (hypertension)   . Hyperlipidemia   . Leg pain    July, 2012, Arterial Dopplers March 08, 2011 normal  . 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 PONV (postoperative nausea and vomiting)   . Rheumatoid arthritis ([place])   . Right rotator cuff tear 11/16/2013  . Stroke [facility])    mini stroke in past ???    Past Surgical History:  Procedure Laterality Date  . CARDIAC CATHETERIZATION      with stent placement in 2001  . CARDIAC CATHETERIZATION N/A 03/12/2016   Procedure: Left Heart Cath and Coronary Angiography;  Surgeon: Nelva Bush, MD;  Location: [place] [facility];  Service: Cardiovascular;  Laterality: N/A;  . CARDIAC CATHETERIZATION N/A 03/12/2016   Procedure: Intravascular Pressure Wire/FFR Study;  Surgeon: Nelva Bush, MD;  Location: [place] [facility];  Service: Cardiovascular;  Laterality: N/A;  . CHOLECYSTECTOMY    . CORONARY ANGIOPLASTY     2002  . ESOPHAGOGASTRODUODENOSCOPY N/A 12/03/2016   Procedure: ESOPHAGOGASTRODUODENOSCOPY (EGD);  Surgeon: Carol [place], MD;  Location: [place];  Service: Endoscopy;  Laterality: N/A;  . laproscopic adjustable gastric  banding     with APS standard system.   . Pt has 3 stents     sept 2001  . SHOULDER ARTHROSCOPY WITH SUBACROMIAL DECOMPRESSION, ROTATOR CUFF REPAIR AND BICEP TENDON REPAIR Right 11/16/2013   Procedure: RIGHT SHOULDER ARTHROSCOPY, EXTENSIVE DEBRIDEMENT, ROTATOR CUFF REPAIR ;  Surgeon: [name], MD;  Location: [place];  Service: Orthopedics;  Laterality: Right;  . TUBAL LIGATION       reports that she has never smoked. She has never used smokeless tobacco. She reports that she does not drink alcohol or use drugs.  Allergies  Allergen Reactions  . Codeine Phosphate Shortness Of Breath    REACTION: unspecified  . Amoxicillin Nausea And Vomiting    REACTION: unspecified  .  Penicillins Nausea And Vomiting    Has patient had a PCN reaction causing immediate rash, facial/tongue/throat swelling, SOB or lightheadedness with hypotension:YES Has patient had a PCN reaction causing severe rash involving mucus membranes or skin necrosis: NO Has patient had a PCN reaction that required hospitalization NO Has patient had a PCN reaction occurring within the last 10 years: NO If all of the above answers are "NO", then may proceed with Cephalosporin use.  Marland Kitchen Morphine And Related Nausea And  Vomiting  . Lidocaine Rash    Patch caused rash    Family History  Problem Relation Age of Onset  . Heart attack Mother   . Heart disease Mother   . Diabetes Brother   . Heart disease Brother   . Diabetes Sister   . Arthritis/Rheumatoid Father   . Cirrhosis Father        hepatic cirrhosis  . Heart disease Maternal Aunt   . Heart attack Maternal Aunt   . Throat cancer Maternal Aunt   . Heart attack Maternal Uncle   . Heart disease Brother   . Cancer Unknown        family history  . Heart attack Unknown        family history  . Colon cancer Maternal Grandmother   . Throat cancer Maternal Grandfather      Prior to Admission medications   Medication Sig Start Date End Date Taking? Authorizing Provider  allopurinol (ZYLOPRIM) 100 MG tablet Take 400 mg by mouth daily.   Yes [provider]  alum & mag hydroxide-simeth (MAALOX/MYLANTA) 200-200-20 MG/5ML suspension Take 30 mLs by mouth every 4 (four) hours as needed for indigestion or heartburn. 12/04/16  Yes Lavina Hamman, MD  aspirin 81 MG tablet Take 81 mg by mouth daily.     Yes [provider]  atorvastatin (LIPITOR) 40 MG tablet Take 2 tablets (80 mg total) by mouth daily at 6 PM. 04/11/18  Yes Dorothy Spark, MD  clopidogrel (PLAVIX) 75 MG tablet Take 1 tablet (75 mg total) by mouth daily. 04/11/18  Yes Dorothy Spark, MD  esomeprazole (NEXIUM) 40 MG capsule Take 1 capsule (40 mg total) by mouth 2 (two) times daily before a meal. For acid reflux 12/04/16  Yes Lavina Hamman, MD  ezetimibe (ZETIA) 10 MG tablet Take 1 tablet (10 mg total) by mouth daily. 04/11/18  Yes Dorothy Spark, MD  folic acid (FOLVITE) 1 MG tablet Take 1 mg by mouth daily. Takes with chemo medication. 03/05/14  Yes [provider]  furosemide (LASIX) 40 MG tablet TAKE 1 TABLET BY MOUTH  DAILY AND MAY TAKE AN  ADDITIONAL 1 TABLET DAILY  AS NEEDED FOR SWELLING. 04/11/18  Yes Dorothy Spark, MD  hydroxychloroquine  (PLAQUENIL) 200 MG tablet Take 300 mg by mouth daily. 03/22/18  Yes [provider]  leflunomide (ARAVA) 20 MG tablet Take 20 mg by mouth daily.   Yes [provider]  levocetirizine (XYZAL) 5 MG tablet Take 1 tablet by mouth daily. 04/04/18  Yes [provider]  methotrexate (RHEUMATREX) 2.5 MG tablet Take 10 mg by mouth once a week. Takes on Sundays. 05/07/18  Yes [provider]  metoprolol tartrate (LOPRESSOR) 50 MG tablet TAKE 1 TABLET BY MOUTH ONCE DAILY 04/11/18  Yes Dorothy Spark, MD  nitroGLYCERIN (NITROSTAT) 0.4 MG SL tablet DISSOLVE 1 TABLET UNDER THE TONGUE EVERY 5 MINUTES AS  NEEDED (NOT TO EXCEED 3  TABLETS IN 15 MINS, CALL  911 IF CHEST PAIN PERSISTS 02/03/18  Yes Dorothy Spark, MD  Omega-3 Fatty Acids (FISH OIL) 1000 MG CAPS Take 1 capsule by mouth daily.    Yes [provider]  predniSONE (DELTASONE) 5 MG tablet Take 10 mg by mouth daily with breakfast.    Yes [provider]  ramipril (ALTACE) 2.5 MG capsule Take 1 capsule (2.5 mg total) by mouth daily. 04/11/18  Yes Dorothy Spark, MD  senna-docusate (SENOKOT-S) 8.6-50 MG tablet Take 1 tablet by mouth daily as needed for mild constipation.   Yes [provider]    Physical Exam: Vitals:   05/14/18 0030 05/14/18 0219 05/14/18 0220 05/14/18 0300  BP: (!) 142/58 128/62 128/62 (!) 133/56  Pulse: (!) 121 (!) 105 (!) 104 95  Resp: 14  14 13   Temp:   98.6 F (37 C)   TempSrc:   Oral   SpO2: 96% 94% 96% 92%  Weight:      Height:        Constitutional: NAD, calm, comfortable Eyes: PERRL, lids and conjunctivae normal ENMT: Mucous membranes are moist. Posterior pharynx clear of any exudate or lesions.Normal dentition.  Neck: normal, supple, no masses, no thyromegaly Respiratory: clear to auscultation bilaterally, no wheezing, no crackles. Normal respiratory effort. No accessory muscle use.  Cardiovascular: Regular rate and rhythm, no murmurs / rubs /  gallops. No extremity edema. 2+ pedal pulses. No carotid bruits.  Abdomen: no tenderness, no masses palpated. No hepatosplenomegaly. Bowel sounds positive.  Musculoskeletal: no clubbing / cyanosis. No joint deformity upper and lower extremities. Good ROM, no contractures. Normal muscle tone.  Skin: no rashes, lesions, ulcers. No induration Neurologic: CN 2-12 grossly intact. Sensation intact, DTR normal. Strength 5/5 in all 4.  Psychiatric: Normal judgment and insight. Alert and oriented x 3. Normal mood.    Labs on Admission: I have personally reviewed following labs and imaging studies  CBC: Recent Labs  Lab 05/14/18 0050  WBC 18.7*  NEUTROABS 15.7*  HGB 11.2*  HCT 35.4*  MCV 94.7  PLT 937   Basic Metabolic Panel: Recent Labs  Lab 05/14/18 0050  NA 135  K 3.4*  CL 101  CO2 25  GLUCOSE 142*  BUN 14  CREATININE 0.66  CALCIUM 8.8*   GFR: Estimated Creatinine Clearance: 75.3 mL/min (by C-G formula based on SCr of 0.66 mg/dL). Liver Function Tests: No results for input(s): AST, ALT, ALKPHOS, BILITOT, PROT, ALBUMIN in the last 168 hours. No results for input(s): LIPASE, AMYLASE in the last 168 hours. No results for input(s): AMMONIA in the last 168 hours. Coagulation Profile: No results for input(s): INR, PROTIME in the last 168 hours. Cardiac Enzymes: No results for input(s): CKTOTAL, CKMB, CKMBINDEX, TROPONINI in the last 168 hours. BNP (last 3 results) No results for input(s): PROBNP in the last 8760 hours. HbA1C: No results for input(s): HGBA1C in the last 72 hours. CBG: No results for input(s): GLUCAP in the last 168 hours. Lipid Profile: No results for input(s): CHOL, HDL, LDLCALC, TRIG, CHOLHDL, LDLDIRECT in the last 72 hours. Thyroid Function Tests: No results for input(s): TSH, T4TOTAL, FREET4, T3FREE, THYROIDAB in the last 72 hours. Anemia Panel: No results for input(s): VITAMINB12, FOLATE, FERRITIN, TIBC, IRON, RETICCTPCT in the last 72 hours. Urine  analysis:    Component Value Date/Time   COLORURINE STRAW (A) 12/02/2016 1743   APPEARANCEUR CLEAR 12/02/2016 1743   LABSPEC 1.005 12/02/2016 1743   PHURINE 5.0 12/02/2016 [place] 12/02/2016 1743  HGBUR NEGATIVE 12/02/2016 1743   BILIRUBINUR NEGATIVE 12/02/2016 1743   KETONESUR NEGATIVE 12/02/2016 1743   PROTEINUR NEGATIVE 12/02/2016 1743   UROBILINOGEN 0.2 12/28/2006 0943   NITRITE NEGATIVE 12/02/2016 1743   LEUKOCYTESUR NEGATIVE 12/02/2016 1743    Radiological Exams on Admission: Dg Chest 2 View  Result Date: 05/14/2018 CLINICAL DATA:  67 year old female with fall. History of CHF. EXAM: CHEST - 2 VIEW COMPARISON:  Chest CT dated 05/14/2018 FINDINGS: There is cardiomegaly with probable mild vascular congestion. No focal consolidation, pleural effusion, or pneumothorax. No acute osseous pathology. IMPRESSION: Cardiomegaly with probable mild vascular congestion. No focal consolidation. Electronically Signed   By: Anner Crete M.D.   On: 05/14/2018 01:10   Dg Pelvis 1-2 Views  Result Date: 05/14/2018 CLINICAL DATA:  Fall EXAM: PELVIS - 1-2 VIEW COMPARISON:  None. FINDINGS: There is no evidence of pelvic fracture or diastasis. No pelvic bone lesions are seen. IMPRESSION: Negative. Electronically Signed   By: Kerby Moors M.D.   On: 05/14/2018 01:10   Dg Forearm Right  Result Date: 05/14/2018 CLINICAL DATA:  Fall.  Right wrist pain. EXAM: RIGHT FOREARM - 2 VIEW COMPARISON:  None. FINDINGS: There is a an acute intra-articular fracture deformity involving the distal radius. Dorsal angulation of the distal fracture fragments noted. No dislocation identified. IMPRESSION: 1. Acute fracture involves the dorsal aspect of the distal radius. Mild dorsal angulation of the distal fracture fragments. Electronically Signed   By: Kerby Moors M.D.   On: 05/14/2018 01:07   Dg Wrist Complete Left  Result Date: 05/14/2018 CLINICAL DATA:  67 year old female with fall and left  wrist pain. EXAM: LEFT WRIST - COMPLETE 3+ VIEW COMPARISON:  None. FINDINGS: There is no acute fracture or dislocation. The bones are osteopenic. No significant arthritic changes. There is soft tissue swelling over the wrist and ulna. No radiopaque foreign object or soft tissue gas. IMPRESSION: No acute fracture or dislocation. Electronically Signed   By: Anner Crete M.D.   On: 05/14/2018 00:13   Dg Wrist Complete Right  Result Date: 05/14/2018 CLINICAL DATA:  Pain after fall EXAM: RIGHT WRIST - COMPLETE 3+ VIEW COMPARISON:  None. FINDINGS: There is a comminuted mildly displaced fracture through the distal radial metaphysis. IMPRESSION: Comminuted mildly displaced fracture through the distal radial metaphysis. Electronically Signed   By: Dorise Bullion III M.D   On: 05/14/2018 00:12   Dg Knee 2 Views Right  Result Date: 05/14/2018 CLINICAL DATA:  Pain after fall EXAM: RIGHT KNEE - 1-2 VIEW COMPARISON:  None. FINDINGS: A hemarthrosis is identified. There is a depressed lateral tibial plateau fracture which is comminuted. IMPRESSION: Comminuted mildly depressed lateral tibial plateau fracture with resulting hemarthrosis. Electronically Signed   By: Dorise Bullion III M.D   On: 05/14/2018 00:08   Dg Tibia/fibula Right  Result Date: 05/14/2018 CLINICAL DATA:  Pain after fall EXAM: RIGHT TIBIA AND FIBULA - 2 VIEW COMPARISON:  None. FINDINGS: There is a comminuted depressed fracture through the lateral tibial plateau. There is a comminuted fracture through the distal fibular diaphysis. There is a lucency through the distal medial malleolus with a step-off. These hemarthrosis in the suprapatellar fossa. IMPRESSION: 1. Depressed comminuted lateral tibial plateau fracture. 2. Comminuted fracture through the distal fibular diaphysis. 3. Fracture through the distal medial malleolus. Electronically Signed   By: Dorise Bullion III M.D   On: 05/14/2018 00:14   Dg Ankle Complete Right  Result Date:  05/14/2018 CLINICAL DATA:  Pain after fall EXAM: RIGHT ANKLE -  COMPLETE 3+ VIEW COMPARISON:  None. FINDINGS: There is a comminuted fracture through the distal fibular diaphysis. There is a fracture through the medial malleolus. There also be appears to be a fracture through the distal tibia where it abuts the fibula. Irregularity of the distal most fibula is consistent with an age indeterminate fracture but may not be acute. Soft tissue swelling. IMPRESSION: 1. Fracture through the medial malleolus. 2. Apparent fracture through the lateral aspect of the distal tibia with displacement. 3. Fracture through the fibular diaphysis distally. 4. Irregularity of the distal tip of the fibula may be nonacute from previous injury. 5. No definitive posterior malleolar fracture. However, given the complex nature of the ankle fracture, consider a CT scan for better evaluation. Electronically Signed   By: Dorise Bullion III M.D   On: 05/14/2018 00:17   Ct Head Wo Contrast  Result Date: 05/13/2018 CLINICAL DATA:  67 year old female with facial trauma. EXAM: CT HEAD WITHOUT CONTRAST CT MAXILLOFACIAL WITHOUT CONTRAST CT CERVICAL SPINE WITHOUT CONTRAST TECHNIQUE: Multidetector CT imaging of the head, cervical spine, and maxillofacial structures were performed using the standard protocol without intravenous contrast. Multiplanar CT image reconstructions of the cervical spine and maxillofacial structures were also generated. COMPARISON:  Head CT dated 07/23/2007 FINDINGS: CT HEAD FINDINGS Brain: The ventricles and sulci appropriate size for patient's age. The gray-white matter discrimination is preserved. There is no acute intracranial hemorrhage. No mass effect or midline shift. No extra-axial fluid collection. Vascular: No hyperdense vessel or unexpected calcification. Skull: Normal. Negative for fracture or focal lesion. Other: None. CT MAXILLOFACIAL FINDINGS Osseous: No fracture or mandibular dislocation. No destructive  process. Orbits: Negative. No traumatic or inflammatory finding. Sinuses: Clear. Soft tissues: Negative. CT CERVICAL SPINE FINDINGS Alignment: No acute subluxation. Straightening of normal cervical lordosis which may be positional or due to muscle spasm. Skull base and vertebrae: No acute fracture. Soft tissues and spinal canal: No prevertebral fluid or swelling. No visible canal hematoma. Disc levels:  Degenerative changes at C5-C6. Upper chest: Negative. Other: None IMPRESSION: 1. No acute intracranial pathology. 2. No acute facial bone fractures. 3. No acute/traumatic cervical spine pathology. Electronically Signed   By: Anner Crete M.D.   On: 05/13/2018 23:40   Ct Chest W Contrast  Result Date: 05/14/2018 CLINICAL DATA:  67 year old female with trauma. EXAM: CT CHEST, ABDOMEN, AND PELVIS WITH CONTRAST TECHNIQUE: Multidetector CT imaging of the chest, abdomen and pelvis was performed following the standard protocol during bolus administration of intravenous contrast. CONTRAST:  167mL ISOVUE-300 IOPAMIDOL (ISOVUE-300) INJECTION 61% COMPARISON:  CT of the abdomen pelvis dated 12/02/2016 FINDINGS: CT CHEST FINDINGS Cardiovascular: There is mild cardiomegaly. No pericardial effusion. Multi vessel coronary vascular calcification. Mild atherosclerotic calcification of the thoracic aorta. No aneurysmal dilatation or dissection. The visualized origins of the great vessels of the aortic arch are patent. The central pulmonary arteries are grossly unremarkable. Mediastinum/Nodes: No hilar or mediastinal adenopathy. Esophagus and thyroid gland are grossly unremarkable. No mediastinal fluid collection. Lungs/Pleura: Bibasilar linear atelectasis/scarring. The lungs are clear. There is no pleural effusion or pneumothorax. The central airways are patent. Musculoskeletal: No chest wall mass or suspicious bone lesions identified. CT ABDOMEN PELVIS FINDINGS No intra-abdominal free air or free fluid. Hepatobiliary: The  liver is unremarkable. Cholecystectomy. Mild intrahepatic biliary ductal dilatation. Pancreas: Unremarkable. No pancreatic ductal dilatation or surrounding inflammatory changes. Spleen: Normal in size without focal abnormality. Adrenals/Urinary Tract: The adrenal glands are unremarkable. Small nonobstructing right renal inferior pole hypodense focus which is too small to  characterize. There is no hydronephrosis on either side. There is symmetric enhancement and excretion of contrast by both kidneys. The visualized ureters and urinary bladder appear unremarkable. Stomach/Bowel: There is sigmoid diverticulosis without active inflammatory changes. A gastric lap band is noted. There is no bowel obstruction or active inflammation. The appendix is not visualized with certainty. No inflammatory changes identified in the right lower quadrant. Vascular/Lymphatic: Moderate aortoiliac atherosclerotic disease. No portal venous gas. There is no adenopathy. Reproductive: The uterus is anteverted and grossly unremarkable. Other: None Musculoskeletal: Grade 1 L4-L5 anterolisthesis. No acute osseous pathology. IMPRESSION: No acute intrathoracic, abdominal, or pelvic pathology. Electronically Signed   By: Anner Crete M.D.   On: 05/14/2018 02:42   Ct Cervical Spine Wo Contrast  Result Date: 05/13/2018 CLINICAL DATA:  67 year old female with facial trauma. EXAM: CT HEAD WITHOUT CONTRAST CT MAXILLOFACIAL WITHOUT CONTRAST CT CERVICAL SPINE WITHOUT CONTRAST TECHNIQUE: Multidetector CT imaging of the head, cervical spine, and maxillofacial structures were performed using the standard protocol without intravenous contrast. Multiplanar CT image reconstructions of the cervical spine and maxillofacial structures were also generated. COMPARISON:  Head CT dated 07/23/2007 FINDINGS: CT HEAD FINDINGS Brain: The ventricles and sulci appropriate size for patient's age. The gray-white matter discrimination is preserved. There is no acute  intracranial hemorrhage. No mass effect or midline shift. No extra-axial fluid collection. Vascular: No hyperdense vessel or unexpected calcification. Skull: Normal. Negative for fracture or focal lesion. Other: None. CT MAXILLOFACIAL FINDINGS Osseous: No fracture or mandibular dislocation. No destructive process. Orbits: Negative. No traumatic or inflammatory finding. Sinuses: Clear. Soft tissues: Negative. CT CERVICAL SPINE FINDINGS Alignment: No acute subluxation. Straightening of normal cervical lordosis which may be positional or due to muscle spasm. Skull base and vertebrae: No acute fracture. Soft tissues and spinal canal: No prevertebral fluid or swelling. No visible canal hematoma. Disc levels:  Degenerative changes at C5-C6. Upper chest: Negative. Other: None IMPRESSION: 1. No acute intracranial pathology. 2. No acute facial bone fractures. 3. No acute/traumatic cervical spine pathology. Electronically Signed   By: Anner Crete M.D.   On: 05/13/2018 23:40   Ct Knee Right Wo Contrast  Result Date: 05/14/2018 CLINICAL DATA:  67 year old female with fall and right knee pain. EXAM: CT OF THE right KNEE WITHOUT CONTRAST TECHNIQUE: Multidetector CT imaging of the right knee was performed according to the standard protocol. Multiplanar CT image reconstructions were also generated. COMPARISON:  Earlier radiograph dated 05/13/2018 FINDINGS: Bones/Joint/Cartilage There is a comminuted intra-articular fracture of the proximal tibia with mild depression of the lateral tibial plateau. The bones are osteopenic. There is no dislocation. There is a large suprapatellar lipohemarthrosis. Ligaments Suboptimally assessed by CT. Muscles and Tendons No acute findings. No intramuscular hematoma. Soft tissues There is diffuse subcutaneous stranding of the lateral knee. No fluid collection. IMPRESSION: 1. Comminuted intra-articular fracture of the proximal tibia with mild depression of the lateral tibial plateau. 2. Large  suprapatellar lipohemarthrosis. Electronically Signed   By: Anner Crete M.D.   On: 05/14/2018 02:33   Ct Abdomen Pelvis W Contrast  Result Date: 05/14/2018 CLINICAL DATA:  67 year old female with trauma. EXAM: CT CHEST, ABDOMEN, AND PELVIS WITH CONTRAST TECHNIQUE: Multidetector CT imaging of the chest, abdomen and pelvis was performed following the standard protocol during bolus administration of intravenous contrast. CONTRAST:  164mL ISOVUE-300 IOPAMIDOL (ISOVUE-300) INJECTION 61% COMPARISON:  CT of the abdomen pelvis dated 12/02/2016 FINDINGS: CT CHEST FINDINGS Cardiovascular: There is mild cardiomegaly. No pericardial effusion. Multi vessel coronary vascular calcification. Mild atherosclerotic calcification  of the thoracic aorta. No aneurysmal dilatation or dissection. The visualized origins of the great vessels of the aortic arch are patent. The central pulmonary arteries are grossly unremarkable. Mediastinum/Nodes: No hilar or mediastinal adenopathy. Esophagus and thyroid gland are grossly unremarkable. No mediastinal fluid collection. Lungs/Pleura: Bibasilar linear atelectasis/scarring. The lungs are clear. There is no pleural effusion or pneumothorax. The central airways are patent. Musculoskeletal: No chest wall mass or suspicious bone lesions identified. CT ABDOMEN PELVIS FINDINGS No intra-abdominal free air or free fluid. Hepatobiliary: The liver is unremarkable. Cholecystectomy. Mild intrahepatic biliary ductal dilatation. Pancreas: Unremarkable. No pancreatic ductal dilatation or surrounding inflammatory changes. Spleen: Normal in size without focal abnormality. Adrenals/Urinary Tract: The adrenal glands are unremarkable. Small nonobstructing right renal inferior pole hypodense focus which is too small to characterize. There is no hydronephrosis on either side. There is symmetric enhancement and excretion of contrast by both kidneys. The visualized ureters and urinary bladder appear  unremarkable. Stomach/Bowel: There is sigmoid diverticulosis without active inflammatory changes. A gastric lap band is noted. There is no bowel obstruction or active inflammation. The appendix is not visualized with certainty. No inflammatory changes identified in the right lower quadrant. Vascular/Lymphatic: Moderate aortoiliac atherosclerotic disease. No portal venous gas. There is no adenopathy. Reproductive: The uterus is anteverted and grossly unremarkable. Other: None Musculoskeletal: Grade 1 L4-L5 anterolisthesis. No acute osseous pathology. IMPRESSION: No acute intrathoracic, abdominal, or pelvic pathology. Electronically Signed   By: Anner Crete M.D.   On: 05/14/2018 02:42   Ct Ankle Right Wo Contrast  Result Date: 05/14/2018 CLINICAL DATA:  67 year old female with fall and right ankle pain. EXAM: CT OF THE RIGHT ANKLE WITHOUT CONTRAST TECHNIQUE: Multidetector CT imaging of the right ankle was performed according to the standard protocol. Multiplanar CT image reconstructions were also generated. COMPARISON:  Right ankle radiograph dated 05/13/2018 FINDINGS: Bones/Joint/Cartilage There is a comminuted fracture of the distal fibula with extension of the fracture into the distal tibia-fibular syndesmosis. There is a minimally displaced fracture of the lateral aspect of the tibial plafond and nondisplaced fracture of the posterior malleolus. There is a minimally displaced fracture of the medial malleolus. There bones are osteopenic. Discontinuity of the plantar cortex of the medial cuneiform (series 5, image 58 and sagittal series 6, image 89) concerning for an avulsion fracture. There is also linear lucency through the plantar base of the first metatarsal (series 6, image 90) also concerning for an avulsion corner fracture. Probable nondisplaced fracture of the dorsal aspect of the medial cuneiform (series 4, image 146 and sagittal series 6, image 69). Fracture of the base of the second metatarsal  (series 6, image 73 and series 4, image 161). Nondisplaced linear lucency through the proximal fourth metatarsal (series 6, image 58) may be artifactual or represent a vascular groove or a nondisplaced fracture. No dislocation. Diffuse soft tissue edema. Ligaments Suboptimally assessed by CT. Muscles and Tendons No acute intramuscular findings. Soft tissues Diffuse subcutaneous edema. IMPRESSION: 1. Comminuted fracture of the distal fibula with extension of the fracture into the distal tibia-fibular syndesmosis. 2. Minimally displaced fracture of the lateral aspect of the tibial plafond and nondisplaced fracture of the posterior malleolus. 3. Minimally displaced fracture of the medial malleolus. 4. Nondisplaced fracture of the base of the second metatarsal. 5. Linear lucency through the plantar cortex of the medial cuneiform concerning for an avulsion fracture. 6. Linear lucency through the proximal fourth metatarsal may be artifactual or represent a nondisplaced fracture. Electronically Signed   By: Anner Crete  M.D.   On: 05/14/2018 02:55   Dg Shoulder Left  Result Date: 05/14/2018 CLINICAL DATA:  General left shoulder pain.  Pain after fall. EXAM: LEFT SHOULDER - 2+ VIEW COMPARISON:  None. FINDINGS: There is no evidence of fracture or dislocation. There is no evidence of arthropathy or other focal bone abnormality. Soft tissues are unremarkable. IMPRESSION: Negative. Electronically Signed   By: Dorise Bullion III M.D   On: 05/14/2018 00:07   Dg Hand Complete Right  Result Date: 05/14/2018 CLINICAL DATA:  67 year old female with fall and trauma to the right hand. EXAM: RIGHT HAND - COMPLETE 3+ VIEW COMPARISON:  Right wrist radiograph dated 05/13/2018 FINDINGS: Comminuted mildly displaced intra-articular fracture of the distal radius as seen on the earlier wrist radiograph. No other acute fracture noted. The bones are osteopenic. No dislocation. Soft tissue swelling of the wrist. IMPRESSION: Comminuted  mildly displaced intra-articular fracture of the distal radius. Electronically Signed   By: Anner Crete M.D.   On: 05/14/2018 01:09   Ct Maxillofacial Wo Contrast  Result Date: 05/13/2018 CLINICAL DATA:  67 year old female with facial trauma. EXAM: CT HEAD WITHOUT CONTRAST CT MAXILLOFACIAL WITHOUT CONTRAST CT CERVICAL SPINE WITHOUT CONTRAST TECHNIQUE: Multidetector CT imaging of the head, cervical spine, and maxillofacial structures were performed using the standard protocol without intravenous contrast. Multiplanar CT image reconstructions of the cervical spine and maxillofacial structures were also generated. COMPARISON:  Head CT dated 07/23/2007 FINDINGS: CT HEAD FINDINGS Brain: The ventricles and sulci appropriate size for patient's age. The gray-white matter discrimination is preserved. There is no acute intracranial hemorrhage. No mass effect or midline shift. No extra-axial fluid collection. Vascular: No hyperdense vessel or unexpected calcification. Skull: Normal. Negative for fracture or focal lesion. Other: None. CT MAXILLOFACIAL FINDINGS Osseous: No fracture or mandibular dislocation. No destructive process. Orbits: Negative. No traumatic or inflammatory finding. Sinuses: Clear. Soft tissues: Negative. CT CERVICAL SPINE FINDINGS Alignment: No acute subluxation. Straightening of normal cervical lordosis which may be positional or due to muscle spasm. Skull base and vertebrae: No acute fracture. Soft tissues and spinal canal: No prevertebral fluid or swelling. No visible canal hematoma. Disc levels:  Degenerative changes at C5-C6. Upper chest: Negative. Other: None IMPRESSION: 1. No acute intracranial pathology. 2. No acute facial bone fractures. 3. No acute/traumatic cervical spine pathology. Electronically Signed   By: Anner Crete M.D.   On: 05/13/2018 23:40    EKG: Independently reviewed.  Assessment/Plan Principal Problem:   Multiple fractures Active Problems:   Essential  hypertension   Rheumatoid arthritis ([place])   Coronary artery disease involving native coronary artery of native heart without angina pectoris   Chronic diastolic CHF (congestive heart failure) (HCC)   Fracture of distal end of right radius   Tibial plateau fracture, right, closed, initial encounter    1. Multiple fractures - R wrist, R knee, R ankle and foot. 1. See radiologic studies for details 2. Ortho surgery reportedly coming in to consult on patient now. 3. NPO until we know about surgery 4. Will order dilaudid PCA for pain control given need for multiple rounds of dilaudid in ED 2. HTN - 1. Holding home BP meds given need for high dose narcotics 2. Use PRN meds if BP elevates 3. CHF - 1. Holding diuretics for the moment 4. RA - 1. Continue MTX, plaquenil 2. Continue prednisone 3. Defer stress dose steroids with surgery to anesthesia.  Not in adrenal crisis clinically at the moment. 5. CAD - 1. Holding ASA and Plavix given  acute fractures, likely need for surgery. 2. Last stent was years ago (2002 from what I can tell), no recent stents on the 2017 cath.  DVT prophylaxis: L SCD for the moment, delaying chemo PPx till we know what ortho surgery is planning Code Status: Full Family Communication: Husband at bedside Disposition Plan: TBD, suspect she will need rehab of some sort. Consults called: EDP spoke with ortho surgery. Admission status: Admit to inpatient  Severity of Illness: The appropriate patient status for this patient is INPATIENT. Inpatient status is judged to be reasonable and necessary in order to provide the required intensity of service to ensure the patient's safety. The patient's presenting symptoms, physical exam findings, and initial radiographic and laboratory data in the context of their chronic comorbidities is felt to place them at high risk for further clinical deterioration. Furthermore, it is not anticipated that the patient will be medically stable  for discharge from the hospital within 2 midnights of admission. The following factors support the patient status of inpatient.   " The patient's presenting symptoms include Fall down stairs, severe wrist, leg pain.. " The worrisome physical exam findings include Severe wrist and leg pain, inability to ambulate. " The initial radiographic and laboratory data are worrisome because of R distal radius fx, R tibial plateu fx, R ankle fx, R foot fxs. " The chronic co-morbidities include RA, HTN.   * I certify that at the point of admission it is my clinical judgment that the patient will require inpatient hospital care spanning beyond 2 midnights from the point of admission due to high intensity of service, high risk for further deterioration and high frequency of surveillance required.Etta Quill DO [facility] Pager [phone] Only works nights!  If 7AM-7PM, please contact the primary day team physician taking care of patient  [email or web address] Password [email or web address]  05/14/2018, 4:21 AM

## 2018-05-14 NOTE — Progress Notes (Addendum)
PROGRESS NOTE  Cindy Holmes NWG:[id number] DOB: May 23, 1951 DOA: 05/13/2018 PCP: Redmond School, MD  Brief History:  67 year old female with a history of diastolic CHF, hypertension, TIA, coronary artery disease, rheumatoid arthritis, hyperlipidemia presenting after mechanical fall down her front porch resulting in multiple fractures.  The patient had been in her usual state of health prior to mechanical fall.  The patient denied any aura, chest discomfort, shortness breath, dizziness prior to her fall.  Patient denies any syncope.  The patient initially presented to [facility], but she was transferred to [facility] for trauma surgery to evaluate as well as orthopedic surgery.  She has been evaluated by trauma surgery, Dr. Dema Severin.  She is awaiting orthopedic surgery evaluation presently.  ED physician, Dr. Wyvonnia Dusky, spoke with Dr. Lucia Gaskins.  The patient was placed in right knee immobilizer, lower leg splint, right radius splint.  Patient herself denies any fever, chills, chest discomfort, shortness breath, dizziness, vomiting, diarrhea, abdominal pain, dysuria, hematuria.  Assessment/Plan: Mechanical fall resulting in multiple fractures -CT abdomen pelvis--negative for acute findings -CT right ankle== distal tib-fib fracture, tibial plafond fracture, second metatarsal fracture -CT right knee--fracture of the proximal tibia with mild compression of the lateral tibial plateau -X-ray right wrist--distal radius fracture, intra-articular -Orthopedics, Dr. Lucia Gaskins was consulted to EDP -appreciate trauma eval, Dr. Dema Severin -From a medical standpoint, the patient is stable to go to surgery; further evaluation will not change the patient's medical risk for surgery  Coronary artery disease -No chest pain presently -Personally reviewed EKG--sinus rhythm, nonspecific ST changes -BMS stent placed 2001 -Okay to hold aspirin Plavix temporarily for surgery--> restart after surgery -12/03/2016 echo EF 65-70%,  grade 2 DD, no WMA -Switch metoprolol to IV until the patient is able to take po -Last heart catheterization 2017--in-stent stenosis of the proximal LAD stent--medical therapy was recommended  Chronic diastolic CHF -Clinically compensated -Daily weights -11/28/2016 echo as above -restart lasix am 05/15/18 -personally reviewed CXR--unchanged vs 12/02/16  Rheumatoid arthritis -Restart arrival, Plaquenil, methotrexate -Give stress dose steroids as the patient is on prednisone 10 mg daily  Essential hypertension -Switch metoprolol to IV until the patient is able to tolerate po -Restart Altace  Hyperlipidemia -Continue statin when the patient is able to take po  Hypokalemia -replete -check mag    Disposition Plan:   Not stable for DC Family Communication:   Spouse updated at bedside 11/3 Total time spent 35 minutes.  Greater than 50% spent face to face counseling and coordinating care.   Consultants:  Tamsen Snider  Code Status:  FULL   DVT Prophylaxis:  SCD   Procedures: As Listed in Progress Note Above  Antibiotics: None    Subjective: Patient denies fevers, chills, headache, chest pain, dyspnea, nausea, vomiting, diarrhea, abdominal pain, dysuria, hematuria, hematochezia, and melena. Patient complains of pain and right arm and right leg.  Objective: Vitals:   05/14/18 0220 05/14/18 0300 05/14/18 0345 05/14/18 0501  BP: 128/62 (!) 133/56 118/67 (!) 144/61  Pulse: (!) 104 95 97 (!) 105  Resp: 14 13 17 18   Temp: 98.6 F (37 C)   99.3 F (37.4 C)  TempSrc: Oral   Oral  SpO2: 96% 92% 93% 95%  Weight:    100.3 kg  Height:        Intake/Output Summary (Last 24 hours) at 05/14/2018 0648 Last data filed at 05/14/2018 0500 Gross per 24 hour  Intake 1024 ml  Output 500 ml  Net 524 ml  Weight change:  Exam:   General:  Pt is alert, follows commands appropriately, not in acute distress  HEENT: No icterus, No thrush, No neck mass,  [place]/AT  Cardiovascular: RRR, S1/S2, no rubs, no gallops  Respiratory: CTA bilaterally, no wheezing, no crackles, no rhonchi  Abdomen: Soft/+BS, non tender, non distended, no guarding  Extremities: trace LE edema, No lymphangitis, No petechiae, No rashes, no synovitis   Data Reviewed: I have personally reviewed following labs and imaging studies Basic Metabolic Panel: Recent Labs  Lab 05/14/18 0050  NA 135  K 3.4*  CL 101  CO2 25  GLUCOSE 142*  BUN 14  CREATININE 0.66  CALCIUM 8.8*   Liver Function Tests: No results for input(s): AST, ALT, ALKPHOS, BILITOT, PROT, ALBUMIN in the last 168 hours. No results for input(s): LIPASE, AMYLASE in the last 168 hours. No results for input(s): AMMONIA in the last 168 hours. Coagulation Profile: No results for input(s): INR, PROTIME in the last 168 hours. CBC: Recent Labs  Lab 05/14/18 0050  WBC 18.7*  NEUTROABS 15.7*  HGB 11.2*  HCT 35.4*  MCV 94.7  PLT 317   Cardiac Enzymes: No results for input(s): CKTOTAL, CKMB, CKMBINDEX, TROPONINI in the last 168 hours. BNP: Invalid input(s): POCBNP CBG: No results for input(s): GLUCAP in the last 168 hours. HbA1C: No results for input(s): HGBA1C in the last 72 hours. Urine analysis:    Component Value Date/Time   COLORURINE STRAW (A) 12/02/2016 1743   APPEARANCEUR CLEAR 12/02/2016 1743   LABSPEC 1.005 12/02/2016 1743   PHURINE 5.0 12/02/2016 1743   GLUCOSEU NEGATIVE 12/02/2016 1743   HGBUR NEGATIVE 12/02/2016 1743   BILIRUBINUR NEGATIVE 12/02/2016 1743   KETONESUR NEGATIVE 12/02/2016 1743   PROTEINUR NEGATIVE 12/02/2016 1743   UROBILINOGEN 0.2 12/28/2006 0943   NITRITE NEGATIVE 12/02/2016 1743   LEUKOCYTESUR NEGATIVE 12/02/2016 1743   Sepsis Labs: @LABRCNTIP (procalcitonin:4,lacticidven:4) )No results found for this or any previous visit (from the past 240 hour(s)).   Scheduled Meds: . allopurinol  400 mg Oral Daily  . atorvastatin  80 mg Oral q1800  . folic acid  1 mg  Oral Daily  . HYDROmorphone   Intravenous Q4H  . hydroxychloroquine  300 mg Oral Daily  . leflunomide  20 mg Oral Daily  . loratadine  10 mg Oral Daily  . methotrexate  10 mg Oral Q Sun  . pantoprazole  80 mg Oral Q1200  . predniSONE  10 mg Oral Q breakfast   Continuous Infusions:  Procedures/Studies: Dg Chest 2 View  Result Date: 05/14/2018 CLINICAL DATA:  67 year old female with fall. History of CHF. EXAM: CHEST - 2 VIEW COMPARISON:  Chest CT dated 05/14/2018 FINDINGS: There is cardiomegaly with probable mild vascular congestion. No focal consolidation, pleural effusion, or pneumothorax. No acute osseous pathology. IMPRESSION: Cardiomegaly with probable mild vascular congestion. No focal consolidation. Electronically Signed   By: Anner Crete M.D.   On: 05/14/2018 01:10   Dg Pelvis 1-2 Views  Result Date: 05/14/2018 CLINICAL DATA:  Fall EXAM: PELVIS - 1-2 VIEW COMPARISON:  None. FINDINGS: There is no evidence of pelvic fracture or diastasis. No pelvic bone lesions are seen. IMPRESSION: Negative. Electronically Signed   By: Kerby Moors M.D.   On: 05/14/2018 01:10   Dg Forearm Right  Result Date: 05/14/2018 CLINICAL DATA:  Fall.  Right wrist pain. EXAM: RIGHT FOREARM - 2 VIEW COMPARISON:  None. FINDINGS: There is a an acute intra-articular fracture deformity involving the distal radius. Dorsal angulation of the distal  fracture fragments noted. No dislocation identified. IMPRESSION: 1. Acute fracture involves the dorsal aspect of the distal radius. Mild dorsal angulation of the distal fracture fragments. Electronically Signed   By: Kerby Moors M.D.   On: 05/14/2018 01:07   Dg Wrist Complete Left  Result Date: 05/14/2018 CLINICAL DATA:  67 year old female with fall and left wrist pain. EXAM: LEFT WRIST - COMPLETE 3+ VIEW COMPARISON:  None. FINDINGS: There is no acute fracture or dislocation. The bones are osteopenic. No significant arthritic changes. There is soft tissue swelling  over the wrist and ulna. No radiopaque foreign object or soft tissue gas. IMPRESSION: No acute fracture or dislocation. Electronically Signed   By: Anner Crete M.D.   On: 05/14/2018 00:13   Dg Wrist Complete Right  Result Date: 05/14/2018 CLINICAL DATA:  Pain after fall EXAM: RIGHT WRIST - COMPLETE 3+ VIEW COMPARISON:  None. FINDINGS: There is a comminuted mildly displaced fracture through the distal radial metaphysis. IMPRESSION: Comminuted mildly displaced fracture through the distal radial metaphysis. Electronically Signed   By: Dorise Bullion III M.D   On: 05/14/2018 00:12   Dg Knee 2 Views Right  Result Date: 05/14/2018 CLINICAL DATA:  Pain after fall EXAM: RIGHT KNEE - 1-2 VIEW COMPARISON:  None. FINDINGS: A hemarthrosis is identified. There is a depressed lateral tibial plateau fracture which is comminuted. IMPRESSION: Comminuted mildly depressed lateral tibial plateau fracture with resulting hemarthrosis. Electronically Signed   By: Dorise Bullion III M.D   On: 05/14/2018 00:08   Dg Tibia/fibula Right  Result Date: 05/14/2018 CLINICAL DATA:  Pain after fall EXAM: RIGHT TIBIA AND FIBULA - 2 VIEW COMPARISON:  None. FINDINGS: There is a comminuted depressed fracture through the lateral tibial plateau. There is a comminuted fracture through the distal fibular diaphysis. There is a lucency through the distal medial malleolus with a step-off. These hemarthrosis in the suprapatellar fossa. IMPRESSION: 1. Depressed comminuted lateral tibial plateau fracture. 2. Comminuted fracture through the distal fibular diaphysis. 3. Fracture through the distal medial malleolus. Electronically Signed   By: Dorise Bullion III M.D   On: 05/14/2018 00:14   Dg Ankle Complete Right  Result Date: 05/14/2018 CLINICAL DATA:  Pain after fall EXAM: RIGHT ANKLE - COMPLETE 3+ VIEW COMPARISON:  None. FINDINGS: There is a comminuted fracture through the distal fibular diaphysis. There is a fracture through the medial  malleolus. There also be appears to be a fracture through the distal tibia where it abuts the fibula. Irregularity of the distal most fibula is consistent with an age indeterminate fracture but may not be acute. Soft tissue swelling. IMPRESSION: 1. Fracture through the medial malleolus. 2. Apparent fracture through the lateral aspect of the distal tibia with displacement. 3. Fracture through the fibular diaphysis distally. 4. Irregularity of the distal tip of the fibula may be nonacute from previous injury. 5. No definitive posterior malleolar fracture. However, given the complex nature of the ankle fracture, consider a CT scan for better evaluation. Electronically Signed   By: Dorise Bullion III M.D   On: 05/14/2018 00:17   Ct Head Wo Contrast  Result Date: 05/13/2018 CLINICAL DATA:  67 year old female with facial trauma. EXAM: CT HEAD WITHOUT CONTRAST CT MAXILLOFACIAL WITHOUT CONTRAST CT CERVICAL SPINE WITHOUT CONTRAST TECHNIQUE: Multidetector CT imaging of the head, cervical spine, and maxillofacial structures were performed using the standard protocol without intravenous contrast. Multiplanar CT image reconstructions of the cervical spine and maxillofacial structures were also generated. COMPARISON:  Head CT dated 07/23/2007 FINDINGS: CT  HEAD FINDINGS Brain: The ventricles and sulci appropriate size for patient's age. The gray-white matter discrimination is preserved. There is no acute intracranial hemorrhage. No mass effect or midline shift. No extra-axial fluid collection. Vascular: No hyperdense vessel or unexpected calcification. Skull: Normal. Negative for fracture or focal lesion. Other: None. CT MAXILLOFACIAL FINDINGS Osseous: No fracture or mandibular dislocation. No destructive process. Orbits: Negative. No traumatic or inflammatory finding. Sinuses: Clear. Soft tissues: Negative. CT CERVICAL SPINE FINDINGS Alignment: No acute subluxation. Straightening of normal cervical lordosis which may be  positional or due to muscle spasm. Skull base and vertebrae: No acute fracture. Soft tissues and spinal canal: No prevertebral fluid or swelling. No visible canal hematoma. Disc levels:  Degenerative changes at C5-C6. Upper chest: Negative. Other: None IMPRESSION: 1. No acute intracranial pathology. 2. No acute facial bone fractures. 3. No acute/traumatic cervical spine pathology. Electronically Signed   By: Anner Crete M.D.   On: 05/13/2018 23:40   Ct Chest W Contrast  Result Date: 05/14/2018 CLINICAL DATA:  67 year old female with trauma. EXAM: CT CHEST, ABDOMEN, AND PELVIS WITH CONTRAST TECHNIQUE: Multidetector CT imaging of the chest, abdomen and pelvis was performed following the standard protocol during bolus administration of intravenous contrast. CONTRAST:  14mL ISOVUE-300 IOPAMIDOL (ISOVUE-300) INJECTION 61% COMPARISON:  CT of the abdomen pelvis dated 12/02/2016 FINDINGS: CT CHEST FINDINGS Cardiovascular: There is mild cardiomegaly. No pericardial effusion. Multi vessel coronary vascular calcification. Mild atherosclerotic calcification of the thoracic aorta. No aneurysmal dilatation or dissection. The visualized origins of the great vessels of the aortic arch are patent. The central pulmonary arteries are grossly unremarkable. Mediastinum/Nodes: No hilar or mediastinal adenopathy. Esophagus and thyroid gland are grossly unremarkable. No mediastinal fluid collection. Lungs/Pleura: Bibasilar linear atelectasis/scarring. The lungs are clear. There is no pleural effusion or pneumothorax. The central airways are patent. Musculoskeletal: No chest wall mass or suspicious bone lesions identified. CT ABDOMEN PELVIS FINDINGS No intra-abdominal free air or free fluid. Hepatobiliary: The liver is unremarkable. Cholecystectomy. Mild intrahepatic biliary ductal dilatation. Pancreas: Unremarkable. No pancreatic ductal dilatation or surrounding inflammatory changes. Spleen: Normal in size without focal  abnormality. Adrenals/Urinary Tract: The adrenal glands are unremarkable. Small nonobstructing right renal inferior pole hypodense focus which is too small to characterize. There is no hydronephrosis on either side. There is symmetric enhancement and excretion of contrast by both kidneys. The visualized ureters and urinary bladder appear unremarkable. Stomach/Bowel: There is sigmoid diverticulosis without active inflammatory changes. A gastric lap band is noted. There is no bowel obstruction or active inflammation. The appendix is not visualized with certainty. No inflammatory changes identified in the right lower quadrant. Vascular/Lymphatic: Moderate aortoiliac atherosclerotic disease. No portal venous gas. There is no adenopathy. Reproductive: The uterus is anteverted and grossly unremarkable. Other: None Musculoskeletal: Grade 1 L4-L5 anterolisthesis. No acute osseous pathology. IMPRESSION: No acute intrathoracic, abdominal, or pelvic pathology. Electronically Signed   By: Anner Crete M.D.   On: 05/14/2018 02:42   Ct Cervical Spine Wo Contrast  Result Date: 05/13/2018 CLINICAL DATA:  67 year old female with facial trauma. EXAM: CT HEAD WITHOUT CONTRAST CT MAXILLOFACIAL WITHOUT CONTRAST CT CERVICAL SPINE WITHOUT CONTRAST TECHNIQUE: Multidetector CT imaging of the head, cervical spine, and maxillofacial structures were performed using the standard protocol without intravenous contrast. Multiplanar CT image reconstructions of the cervical spine and maxillofacial structures were also generated. COMPARISON:  Head CT dated 07/23/2007 FINDINGS: CT HEAD FINDINGS Brain: The ventricles and sulci appropriate size for patient's age. The gray-white matter discrimination is preserved. There is no  acute intracranial hemorrhage. No mass effect or midline shift. No extra-axial fluid collection. Vascular: No hyperdense vessel or unexpected calcification. Skull: Normal. Negative for fracture or focal lesion. Other: None.  CT MAXILLOFACIAL FINDINGS Osseous: No fracture or mandibular dislocation. No destructive process. Orbits: Negative. No traumatic or inflammatory finding. Sinuses: Clear. Soft tissues: Negative. CT CERVICAL SPINE FINDINGS Alignment: No acute subluxation. Straightening of normal cervical lordosis which may be positional or due to muscle spasm. Skull base and vertebrae: No acute fracture. Soft tissues and spinal canal: No prevertebral fluid or swelling. No visible canal hematoma. Disc levels:  Degenerative changes at C5-C6. Upper chest: Negative. Other: None IMPRESSION: 1. No acute intracranial pathology. 2. No acute facial bone fractures. 3. No acute/traumatic cervical spine pathology. Electronically Signed   By: Anner Crete M.D.   On: 05/13/2018 23:40   Ct Knee Right Wo Contrast  Result Date: 05/14/2018 CLINICAL DATA:  67 year old female with fall and right knee pain. EXAM: CT OF THE right KNEE WITHOUT CONTRAST TECHNIQUE: Multidetector CT imaging of the right knee was performed according to the standard protocol. Multiplanar CT image reconstructions were also generated. COMPARISON:  Earlier radiograph dated 05/13/2018 FINDINGS: Bones/Joint/Cartilage There is a comminuted intra-articular fracture of the proximal tibia with mild depression of the lateral tibial plateau. The bones are osteopenic. There is no dislocation. There is a large suprapatellar lipohemarthrosis. Ligaments Suboptimally assessed by CT. Muscles and Tendons No acute findings. No intramuscular hematoma. Soft tissues There is diffuse subcutaneous stranding of the lateral knee. No fluid collection. IMPRESSION: 1. Comminuted intra-articular fracture of the proximal tibia with mild depression of the lateral tibial plateau. 2. Large suprapatellar lipohemarthrosis. Electronically Signed   By: Anner Crete M.D.   On: 05/14/2018 02:33   Ct Abdomen Pelvis W Contrast  Result Date: 05/14/2018 CLINICAL DATA:  67 year old female with trauma. EXAM:  CT CHEST, ABDOMEN, AND PELVIS WITH CONTRAST TECHNIQUE: Multidetector CT imaging of the chest, abdomen and pelvis was performed following the standard protocol during bolus administration of intravenous contrast. CONTRAST:  154mL ISOVUE-300 IOPAMIDOL (ISOVUE-300) INJECTION 61% COMPARISON:  CT of the abdomen pelvis dated 12/02/2016 FINDINGS: CT CHEST FINDINGS Cardiovascular: There is mild cardiomegaly. No pericardial effusion. Multi vessel coronary vascular calcification. Mild atherosclerotic calcification of the thoracic aorta. No aneurysmal dilatation or dissection. The visualized origins of the great vessels of the aortic arch are patent. The central pulmonary arteries are grossly unremarkable. Mediastinum/Nodes: No hilar or mediastinal adenopathy. Esophagus and thyroid gland are grossly unremarkable. No mediastinal fluid collection. Lungs/Pleura: Bibasilar linear atelectasis/scarring. The lungs are clear. There is no pleural effusion or pneumothorax. The central airways are patent. Musculoskeletal: No chest wall mass or suspicious bone lesions identified. CT ABDOMEN PELVIS FINDINGS No intra-abdominal free air or free fluid. Hepatobiliary: The liver is unremarkable. Cholecystectomy. Mild intrahepatic biliary ductal dilatation. Pancreas: Unremarkable. No pancreatic ductal dilatation or surrounding inflammatory changes. Spleen: Normal in size without focal abnormality. Adrenals/Urinary Tract: The adrenal glands are unremarkable. Small nonobstructing right renal inferior pole hypodense focus which is too small to characterize. There is no hydronephrosis on either side. There is symmetric enhancement and excretion of contrast by both kidneys. The visualized ureters and urinary bladder appear unremarkable. Stomach/Bowel: There is sigmoid diverticulosis without active inflammatory changes. A gastric lap band is noted. There is no bowel obstruction or active inflammation. The appendix is not visualized with certainty. No  inflammatory changes identified in the right lower quadrant. Vascular/Lymphatic: Moderate aortoiliac atherosclerotic disease. No portal venous gas. There is no adenopathy. Reproductive: The  uterus is anteverted and grossly unremarkable. Other: None Musculoskeletal: Grade 1 L4-L5 anterolisthesis. No acute osseous pathology. IMPRESSION: No acute intrathoracic, abdominal, or pelvic pathology. Electronically Signed   By: Anner Crete M.D.   On: 05/14/2018 02:42   Ct Ankle Right Wo Contrast  Result Date: 05/14/2018 CLINICAL DATA:  67 year old female with fall and right ankle pain. EXAM: CT OF THE RIGHT ANKLE WITHOUT CONTRAST TECHNIQUE: Multidetector CT imaging of the right ankle was performed according to the standard protocol. Multiplanar CT image reconstructions were also generated. COMPARISON:  Right ankle radiograph dated 05/13/2018 FINDINGS: Bones/Joint/Cartilage There is a comminuted fracture of the distal fibula with extension of the fracture into the distal tibia-fibular syndesmosis. There is a minimally displaced fracture of the lateral aspect of the tibial plafond and nondisplaced fracture of the posterior malleolus. There is a minimally displaced fracture of the medial malleolus. There bones are osteopenic. Discontinuity of the plantar cortex of the medial cuneiform (series 5, image 58 and sagittal series 6, image 89) concerning for an avulsion fracture. There is also linear lucency through the plantar base of the first metatarsal (series 6, image 90) also concerning for an avulsion corner fracture. Probable nondisplaced fracture of the dorsal aspect of the medial cuneiform (series 4, image 146 and sagittal series 6, image 69). Fracture of the base of the second metatarsal (series 6, image 73 and series 4, image 161). Nondisplaced linear lucency through the proximal fourth metatarsal (series 6, image 58) may be artifactual or represent a vascular groove or a nondisplaced fracture. No dislocation.  Diffuse soft tissue edema. Ligaments Suboptimally assessed by CT. Muscles and Tendons No acute intramuscular findings. Soft tissues Diffuse subcutaneous edema. IMPRESSION: 1. Comminuted fracture of the distal fibula with extension of the fracture into the distal tibia-fibular syndesmosis. 2. Minimally displaced fracture of the lateral aspect of the tibial plafond and nondisplaced fracture of the posterior malleolus. 3. Minimally displaced fracture of the medial malleolus. 4. Nondisplaced fracture of the base of the second metatarsal. 5. Linear lucency through the plantar cortex of the medial cuneiform concerning for an avulsion fracture. 6. Linear lucency through the proximal fourth metatarsal may be artifactual or represent a nondisplaced fracture. Electronically Signed   By: Anner Crete M.D.   On: 05/14/2018 02:55   Dg Shoulder Left  Result Date: 05/14/2018 CLINICAL DATA:  General left shoulder pain.  Pain after fall. EXAM: LEFT SHOULDER - 2+ VIEW COMPARISON:  None. FINDINGS: There is no evidence of fracture or dislocation. There is no evidence of arthropathy or other focal bone abnormality. Soft tissues are unremarkable. IMPRESSION: Negative. Electronically Signed   By: Dorise Bullion III M.D   On: 05/14/2018 00:07   Dg Hand Complete Right  Result Date: 05/14/2018 CLINICAL DATA:  67 year old female with fall and trauma to the right hand. EXAM: RIGHT HAND - COMPLETE 3+ VIEW COMPARISON:  Right wrist radiograph dated 05/13/2018 FINDINGS: Comminuted mildly displaced intra-articular fracture of the distal radius as seen on the earlier wrist radiograph. No other acute fracture noted. The bones are osteopenic. No dislocation. Soft tissue swelling of the wrist. IMPRESSION: Comminuted mildly displaced intra-articular fracture of the distal radius. Electronically Signed   By: Anner Crete M.D.   On: 05/14/2018 01:09   Ct Maxillofacial Wo Contrast  Result Date: 05/13/2018 CLINICAL DATA:  67 year old  female with facial trauma. EXAM: CT HEAD WITHOUT CONTRAST CT MAXILLOFACIAL WITHOUT CONTRAST CT CERVICAL SPINE WITHOUT CONTRAST TECHNIQUE: Multidetector CT imaging of the head, cervical spine, and maxillofacial structures were  performed using the standard protocol without intravenous contrast. Multiplanar CT image reconstructions of the cervical spine and maxillofacial structures were also generated. COMPARISON:  Head CT dated 07/23/2007 FINDINGS: CT HEAD FINDINGS Brain: The ventricles and sulci appropriate size for patient's age. The gray-white matter discrimination is preserved. There is no acute intracranial hemorrhage. No mass effect or midline shift. No extra-axial fluid collection. Vascular: No hyperdense vessel or unexpected calcification. Skull: Normal. Negative for fracture or focal lesion. Other: None. CT MAXILLOFACIAL FINDINGS Osseous: No fracture or mandibular dislocation. No destructive process. Orbits: Negative. No traumatic or inflammatory finding. Sinuses: Clear. Soft tissues: Negative. CT CERVICAL SPINE FINDINGS Alignment: No acute subluxation. Straightening of normal cervical lordosis which may be positional or due to muscle spasm. Skull base and vertebrae: No acute fracture. Soft tissues and spinal canal: No prevertebral fluid or swelling. No visible canal hematoma. Disc levels:  Degenerative changes at C5-C6. Upper chest: Negative. Other: None IMPRESSION: 1. No acute intracranial pathology. 2. No acute facial bone fractures. 3. No acute/traumatic cervical spine pathology. Electronically Signed   By: Anner Crete M.D.   On: 05/13/2018 23:40    Orson Eva, DO  [facility] Pager [phone]  If 7PM-7AM, please contact night-coverage [email or web address] Password [email or web address] 05/14/2018, 6:48 AM   LOS: 0 days

## 2018-05-14 NOTE — Progress Notes (Signed)
Orthopaedic trauma note:  Reviewed imaging with Dr. Lucia Gaskins. 67 year old female with RA and CHF who fell down stairs with right distal radius, right lateral tibial plateau and right ankle fracture. Due to constellation of injuries I recommend proceeding with open reduction internal fixation of all injuries to assist with mobilization and recovery. Tentatively plan for OR tomorrow pending OR availability with possible surgery on Tuesday. NPO after midnight. Orthopaedic trauma consult to follow tomorrow AM.  Shona Needles, MD [facility] [phone] (phone)

## 2018-05-14 NOTE — ED Provider Notes (Addendum)
I discussed the case with Dr. Dema Severin of the trauma service, he will come to see the patient.  The CT scans of the chest abdomen and pelvis were read as no acute traumatic findings.  The case was discussed with trauma surgery who defers to orthopedics.  Discussed with Dr. Lucia Gaskins of orthopedics who deferred to medicine but will consult.  Discussed with Dr. Alcario Drought / hospitalist who will admit.   Noemi Chapel, MD 05/14/18 Noni Saupe    Noemi Chapel, MD 05/14/18 0400

## 2018-05-15 ENCOUNTER — Inpatient Hospital Stay (HOSPITAL_COMMUNITY): Payer: Medicare Other

## 2018-05-15 ENCOUNTER — Inpatient Hospital Stay (HOSPITAL_COMMUNITY)
Admission: EM | Disposition: A | Discharge status: Skilled Nursing Facility | Payer: Self-pay | Source: Home / Self Care | Attending: Family Medicine

## 2018-05-15 ENCOUNTER — Encounter (HOSPITAL_COMMUNITY): Payer: Self-pay | Admitting: Certified Registered Nurse Anesthetist

## 2018-05-15 ENCOUNTER — Inpatient Hospital Stay (HOSPITAL_COMMUNITY): Payer: Medicare Other | Admitting: Certified Registered Nurse Anesthetist

## 2018-05-15 HISTORY — PX: ORIF TIBIA PLATEAU: SHX2132

## 2018-05-15 HISTORY — PX: ORIF ANKLE FRACTURE: SHX5408

## 2018-05-15 HISTORY — PX: OPEN REDUCTION INTERNAL FIXATION (ORIF) DISTAL RADIAL FRACTURE: SHX5989

## 2018-05-15 LAB — BASIC METABOLIC PANEL
Anion gap: 7 (ref 5–15)
BUN: 12 mg/dL (ref 8–23)
CALCIUM: 8.8 mg/dL — AB (ref 8.9–10.3)
CO2: 24 mmol/L (ref 22–32)
Chloride: 103 mmol/L (ref 98–111)
Creatinine, Ser: 0.54 mg/dL (ref 0.44–1.00)
GLUCOSE: 115 mg/dL — AB (ref 70–99)
POTASSIUM: 4.5 mmol/L (ref 3.5–5.1)
Sodium: 134 mmol/L — ABNORMAL LOW (ref 135–145)

## 2018-05-15 LAB — CBC
HCT: 32.4 % — ABNORMAL LOW (ref 36.0–46.0)
HEMOGLOBIN: 10.1 g/dL — AB (ref 12.0–15.0)
MCH: 29.8 pg (ref 26.0–34.0)
MCHC: 31.2 g/dL (ref 30.0–36.0)
MCV: 95.6 fL (ref 80.0–100.0)
NRBC: 0 % (ref 0.0–0.2)
PLATELETS: 196 10*3/uL (ref 150–400)
RBC: 3.39 MIL/uL — AB (ref 3.87–5.11)
RDW: 14.6 % (ref 11.5–15.5)
WBC: 9.2 10*3/uL (ref 4.0–10.5)

## 2018-05-15 LAB — MAGNESIUM: MAGNESIUM: 2.1 mg/dL (ref 1.7–2.4)

## 2018-05-15 SURGERY — OPEN REDUCTION INTERNAL FIXATION (ORIF) TIBIAL PLATEAU
Anesthesia: General | Site: Wrist | Laterality: Right

## 2018-05-15 MED ORDER — HYDROMORPHONE HCL 1 MG/ML IJ SOLN
INTRAMUSCULAR | Status: AC
  Filled 2018-05-15: qty 1

## 2018-05-15 MED ORDER — DEXAMETHASONE SODIUM PHOSPHATE 10 MG/ML IJ SOLN
INTRAMUSCULAR | Status: AC
  Filled 2018-05-15: qty 1

## 2018-05-15 MED ORDER — EPHEDRINE SULFATE-NACL 50-0.9 MG/10ML-% IV SOSY
PREFILLED_SYRINGE | INTRAVENOUS | Status: DC | PRN
  Administered 2018-05-15: 5 mg via INTRAVENOUS

## 2018-05-15 MED ORDER — ROCURONIUM BROMIDE 10 MG/ML (PF) SYRINGE
PREFILLED_SYRINGE | INTRAVENOUS | Status: DC | PRN
  Administered 2018-05-15: 50 mg via INTRAVENOUS
  Administered 2018-05-15: 30 mg via INTRAVENOUS

## 2018-05-15 MED ORDER — SUGAMMADEX SODIUM 200 MG/2ML IV SOLN
INTRAVENOUS | Status: DC | PRN
  Administered 2018-05-15: 200 mg via INTRAVENOUS

## 2018-05-15 MED ORDER — CEFAZOLIN SODIUM-DEXTROSE 2-4 GM/100ML-% IV SOLN
2.0000 g | Freq: Three times a day (TID) | INTRAVENOUS | Status: AC
  Administered 2018-05-15 – 2018-05-16 (×3): 2 g via INTRAVENOUS
  Filled 2018-05-15 (×3): qty 100

## 2018-05-15 MED ORDER — LIDOCAINE 2% (20 MG/ML) 5 ML SYRINGE
INTRAMUSCULAR | Status: DC | PRN
  Administered 2018-05-15: 60 mg via INTRAVENOUS

## 2018-05-15 MED ORDER — ONDANSETRON HCL 4 MG/2ML IJ SOLN
INTRAMUSCULAR | Status: DC | PRN
  Administered 2018-05-15: 4 mg via INTRAVENOUS

## 2018-05-15 MED ORDER — FENTANYL CITRATE (PF) 250 MCG/5ML IJ SOLN
INTRAMUSCULAR | Status: DC | PRN
  Administered 2018-05-15 (×4): 50 ug via INTRAVENOUS
  Administered 2018-05-15: 100 ug via INTRAVENOUS
  Administered 2018-05-15 (×4): 50 ug via INTRAVENOUS

## 2018-05-15 MED ORDER — PROPOFOL 10 MG/ML IV BOLUS
INTRAVENOUS | Status: DC | PRN
  Administered 2018-05-15: 120 mg via INTRAVENOUS
  Administered 2018-05-15: 40 mg via INTRAVENOUS

## 2018-05-15 MED ORDER — PROPOFOL 10 MG/ML IV BOLUS
INTRAVENOUS | Status: AC
  Filled 2018-05-15: qty 20

## 2018-05-15 MED ORDER — FENTANYL CITRATE (PF) 250 MCG/5ML IJ SOLN
INTRAMUSCULAR | Status: AC
  Filled 2018-05-15: qty 10

## 2018-05-15 MED ORDER — ONDANSETRON HCL 4 MG/2ML IJ SOLN
INTRAMUSCULAR | Status: AC
  Filled 2018-05-15: qty 2

## 2018-05-15 MED ORDER — VANCOMYCIN HCL 1000 MG IV SOLR
INTRAVENOUS | Status: AC
  Filled 2018-05-15: qty 2000

## 2018-05-15 MED ORDER — CEFAZOLIN SODIUM-DEXTROSE 2-4 GM/100ML-% IV SOLN
2.0000 g | INTRAVENOUS | Status: AC
  Administered 2018-05-15: 2 g via INTRAVENOUS

## 2018-05-15 MED ORDER — VANCOMYCIN HCL 1000 MG IV SOLR
INTRAVENOUS | Status: DC | PRN
  Administered 2018-05-15 (×3): 1000 mg via TOPICAL

## 2018-05-15 MED ORDER — MIDAZOLAM HCL 2 MG/2ML IJ SOLN
INTRAMUSCULAR | Status: AC
  Filled 2018-05-15: qty 2

## 2018-05-15 MED ORDER — LACTATED RINGERS IV SOLN
INTRAVENOUS | Status: DC
  Administered 2018-05-15 (×3): via INTRAVENOUS

## 2018-05-15 MED ORDER — HYDROMORPHONE HCL 1 MG/ML IJ SOLN
0.2500 mg | INTRAMUSCULAR | Status: DC | PRN
  Administered 2018-05-15 (×4): 0.5 mg via INTRAVENOUS

## 2018-05-15 MED ORDER — MIDAZOLAM HCL 2 MG/2ML IJ SOLN
INTRAMUSCULAR | Status: DC | PRN
  Administered 2018-05-15: 1 mg via INTRAVENOUS

## 2018-05-15 MED ORDER — BACITRACIN ZINC 500 UNIT/GM EX OINT
TOPICAL_OINTMENT | CUTANEOUS | Status: AC
  Filled 2018-05-15: qty 28.35

## 2018-05-15 MED ORDER — EPHEDRINE 5 MG/ML INJ
INTRAVENOUS | Status: AC
  Filled 2018-05-15: qty 10

## 2018-05-15 MED ORDER — BUPIVACAINE HCL 0.5 % IJ SOLN
INTRAMUSCULAR | Status: AC
  Filled 2018-05-15: qty 1

## 2018-05-15 MED ORDER — 0.9 % SODIUM CHLORIDE (POUR BTL) OPTIME
TOPICAL | Status: DC | PRN
  Administered 2018-05-15: 1000 mL

## 2018-05-15 MED ORDER — SUGAMMADEX SODIUM 500 MG/5ML IV SOLN
INTRAVENOUS | Status: AC
  Filled 2018-05-15: qty 5

## 2018-05-15 MED ORDER — VANCOMYCIN HCL 1000 MG IV SOLR
INTRAVENOUS | Status: AC
  Filled 2018-05-15: qty 1000

## 2018-05-15 MED ORDER — DEXAMETHASONE SODIUM PHOSPHATE 10 MG/ML IJ SOLN
INTRAMUSCULAR | Status: DC | PRN
  Administered 2018-05-15: 5 mg via INTRAVENOUS

## 2018-05-15 MED ORDER — CEFAZOLIN SODIUM-DEXTROSE 2-4 GM/100ML-% IV SOLN
INTRAVENOUS | Status: AC
  Filled 2018-05-15: qty 100

## 2018-05-15 MED ORDER — HYDROMORPHONE HCL 1 MG/ML IJ SOLN
0.5000 mg | INTRAMUSCULAR | Status: AC | PRN
  Administered 2018-05-15 (×2): 0.5 mg via INTRAVENOUS

## 2018-05-15 MED ORDER — SCOPOLAMINE 1 MG/3DAYS TD PT72
MEDICATED_PATCH | TRANSDERMAL | Status: DC | PRN
  Administered 2018-05-15: 1 via TRANSDERMAL

## 2018-05-15 MED ORDER — BACITRACIN 500 UNIT/GM EX OINT
TOPICAL_OINTMENT | CUTANEOUS | Status: DC | PRN
  Administered 2018-05-15: 1 via TOPICAL

## 2018-05-15 SURGICAL SUPPLY — 107 items
BANDAGE ACE 4X5 VEL STRL LF (GAUZE/BANDAGES/DRESSINGS) ×4 IMPLANT
BANDAGE ACE 6X5 VEL STRL LF (GAUZE/BANDAGES/DRESSINGS) ×2 IMPLANT
BANDAGE ESMARK 6X9 LF (GAUZE/BANDAGES/DRESSINGS) ×2 IMPLANT
BIT DRILL 100X2XQC STRL (BIT) IMPLANT
BIT DRILL 2.5X110 QC LCP DISP (BIT) ×2 IMPLANT
BIT DRILL CALIBR QC 2.8X250 (BIT) ×2 IMPLANT
BIT DRILL CALIBRATED 1.8MM (BIT) IMPLANT
BIT DRILL LONG 2.7 (BIT) IMPLANT
BIT DRILL QC 2.0X100 (BIT) ×4
BIT DRILL QC 2X125 (BIT) IMPLANT
BIT DRL 100X2XQC STRL (BIT) ×2
BLADE CLIPPER SURG (BLADE) IMPLANT
BLADE SURG 15 STRL LF DISP TIS (BLADE) ×2 IMPLANT
BLADE SURG 15 STRL SS (BLADE) ×8
BNDG CMPR 9X6 STRL LF SNTH (GAUZE/BANDAGES/DRESSINGS) ×2
BNDG CMPR MED 10X6 ELC LF (GAUZE/BANDAGES/DRESSINGS) ×2
BNDG COHESIVE 4X5 TAN STRL (GAUZE/BANDAGES/DRESSINGS) ×4 IMPLANT
BNDG ELASTIC 6X10 VLCR STRL LF (GAUZE/BANDAGES/DRESSINGS) ×2 IMPLANT
BNDG ESMARK 6X9 LF (GAUZE/BANDAGES/DRESSINGS) ×4
BNDG GAUZE ELAST 4 BULKY (GAUZE/BANDAGES/DRESSINGS) ×2 IMPLANT
BRUSH SCRUB SURG 4.25 DISP (MISCELLANEOUS) ×8 IMPLANT
CANISTER SUCT 3000ML PPV (MISCELLANEOUS) ×2 IMPLANT
CHLORAPREP W/TINT 26ML (MISCELLANEOUS) ×8 IMPLANT
COVER SURGICAL LIGHT HANDLE (MISCELLANEOUS) ×2 IMPLANT
COVER WAND RF STERILE (DRAPES) ×4 IMPLANT
CUFF TOURNIQUET SINGLE 18IN (TOURNIQUET CUFF) ×2 IMPLANT
CUFF TOURNIQUET SINGLE 34IN LL (TOURNIQUET CUFF) ×2 IMPLANT
CUFF TOURNIQUET SINGLE 44IN (TOURNIQUET CUFF) ×2 IMPLANT
DRAPE C-ARM 42X72 X-RAY (DRAPES) ×4 IMPLANT
DRAPE C-ARMOR (DRAPES) ×4 IMPLANT
DRAPE ORTHO SPLIT 77X108 STRL (DRAPES) ×8
DRAPE SURG ORHT 6 SPLT 77X108 (DRAPES) ×4 IMPLANT
DRAPE U-SHAPE 47X51 STRL (DRAPES) ×4 IMPLANT
DRILL BIT LONG 2.7 (BIT) ×4
DRILL BIT QC 2X125 (BIT) ×2
DRILL CALIBRATED 1.8MM (BIT) ×4
DRSG ADAPTIC 3X8 NADH LF (GAUZE/BANDAGES/DRESSINGS) IMPLANT
DRSG PAD ABDOMINAL 8X10 ST (GAUZE/BANDAGES/DRESSINGS) ×4 IMPLANT
ELECT REM PT RETURN 9FT ADLT (ELECTROSURGICAL) ×4
ELECTRODE REM PT RTRN 9FT ADLT (ELECTROSURGICAL) ×2 IMPLANT
GAUZE SPONGE 4X4 12PLY STRL (GAUZE/BANDAGES/DRESSINGS) ×4 IMPLANT
GLOVE BIO SURGEON STRL SZ7.5 (GLOVE) ×14 IMPLANT
GLOVE BIOGEL PI IND STRL 7.5 (GLOVE) ×2 IMPLANT
GLOVE BIOGEL PI INDICATOR 7.5 (GLOVE) ×6
GOWN STRL REUS W/ TWL LRG LVL3 (GOWN DISPOSABLE) ×4 IMPLANT
GOWN STRL REUS W/TWL LRG LVL3 (GOWN DISPOSABLE) ×16
IMMOBILIZER KNEE 22 UNIV (SOFTGOODS) ×2 IMPLANT
K-WIRE 1.6X150 (WIRE) ×4
KIT BASIN OR (CUSTOM PROCEDURE TRAY) ×4 IMPLANT
KIT TURNOVER KIT B (KITS) ×4 IMPLANT
KWIRE 1.6X150 (WIRE) IMPLANT
MANIFOLD NEPTUNE II (INSTRUMENTS) ×4 IMPLANT
NDL HYPO 21X1.5 SAFETY (NEEDLE) IMPLANT
NDL SUT 6 .5 CRC .975X.05 MAYO (NEEDLE) ×2 IMPLANT
NEEDLE 25GAX1.5 (MISCELLANEOUS) ×4 IMPLANT
NEEDLE HYPO 21X1.5 SAFETY (NEEDLE) IMPLANT
NEEDLE MAYO TAPER (NEEDLE) ×4
NS IRRIG 1000ML POUR BTL (IV SOLUTION) ×4 IMPLANT
PACK TOTAL JOINT (CUSTOM PROCEDURE TRAY) ×4 IMPLANT
PAD ARMBOARD 7.5X6 YLW CONV (MISCELLANEOUS) ×6 IMPLANT
PAD CAST 4YDX4 CTTN HI CHSV (CAST SUPPLIES) ×2 IMPLANT
PADDING CAST COTTON 4X4 STRL (CAST SUPPLIES)
PADDING CAST COTTON 6X4 STRL (CAST SUPPLIES) ×6 IMPLANT
PLATE LCP RECON 3.5 8H/112 (Plate) ×2 IMPLANT
PLATE RT VA 2COL DIST RAD 6HX3 (Plate) ×2 IMPLANT
PLATE TIBIA VA-LCP 6H RT (Plate) ×2 IMPLANT
SCREW CORT HEADED ST 3.5X30 (Screw) ×4 IMPLANT
SCREW CORTEX 2.7X16MM (Screw) ×2 IMPLANT
SCREW CORTEX 3.5 16MM (Screw) ×4 IMPLANT
SCREW CORTEX 3.5 18MM (Screw) ×4 IMPLANT
SCREW CORTEX 3.5 50MM (Screw) ×4 IMPLANT
SCREW HEADED ST 3.5X55 (Screw) ×2 IMPLANT
SCREW HEADED ST 3.5X65 (Screw) ×2 IMPLANT
SCREW HEADED ST 3.5X80 (Screw) ×2 IMPLANT
SCREW LOCK CORT ST 3.5X16 (Screw) IMPLANT
SCREW LOCK CORT ST 3.5X18 (Screw) IMPLANT
SCREW LOCK T8 20X2.4XSTVA (Screw) IMPLANT
SCREW LOCKING 2.4X20 (Screw) ×4 IMPLANT
SCREW LOCKING 2.4X22 (Screw) ×6 IMPLANT
SCREW LOCKING 3.5X70MM VA (Screw) ×6 IMPLANT
SCREW LOCKING VA 3.5X75MM (Screw) ×2 IMPLANT
SCREW SELF TAP 12M (Screw) ×6 IMPLANT
SPLINT PLASTER CAST XFAST 5X30 (CAST SUPPLIES) IMPLANT
SPLINT PLASTER XFAST SET 5X30 (CAST SUPPLIES) ×2
SPONGE LAP 18X18 X RAY DECT (DISPOSABLE) IMPLANT
STAPLER VISISTAT 35W (STAPLE) ×2 IMPLANT
SUCTION FRAZIER HANDLE 10FR (MISCELLANEOUS) ×2
SUCTION TUBE FRAZIER 10FR DISP (MISCELLANEOUS) ×2 IMPLANT
SUT ETHILON 2 0 FS 18 (SUTURE) ×2 IMPLANT
SUT ETHILON 3 0 PS 1 (SUTURE) ×10 IMPLANT
SUT FIBERWIRE #2 38 T-5 BLUE (SUTURE)
SUT PROLENE 0 CT (SUTURE) IMPLANT
SUT VIC AB 0 CT1 27 (SUTURE) ×8
SUT VIC AB 0 CT1 27XBRD ANBCTR (SUTURE) ×2 IMPLANT
SUT VIC AB 1 CT1 18XCR BRD 8 (SUTURE) IMPLANT
SUT VIC AB 1 CT1 27 (SUTURE)
SUT VIC AB 1 CT1 27XBRD ANBCTR (SUTURE) ×2 IMPLANT
SUT VIC AB 1 CT1 8-18 (SUTURE) ×4
SUT VIC AB 2-0 CT1 27 (SUTURE) ×12
SUT VIC AB 2-0 CT1 TAPERPNT 27 (SUTURE) ×4 IMPLANT
SUTURE FIBERWR #2 38 T-5 BLUE (SUTURE) IMPLANT
SYR CONTROL 10ML LL (SYRINGE) ×4 IMPLANT
TOWEL OR 17X24 6PK STRL BLUE (TOWEL DISPOSABLE) ×4 IMPLANT
TOWEL OR 17X26 10 PK STRL BLUE (TOWEL DISPOSABLE) ×6 IMPLANT
TRAY FOLEY MTR SLVR 16FR STAT (SET/KITS/TRAYS/PACK) IMPLANT
UNDERPAD 30X30 (UNDERPADS AND DIAPERS) ×4 IMPLANT
WATER STERILE IRR 1000ML POUR (IV SOLUTION) ×6 IMPLANT

## 2018-05-15 NOTE — Progress Notes (Signed)
PROGRESS NOTE  Cindy Holmes WNI:[id number] DOB: [birth year] DOA: 05/13/2018 PCP: Redmond School, MD  Brief History:  67 year old female with a history of diastolic CHF, hypertension, TIA, coronary artery disease, rheumatoid arthritis, hyperlipidemia presenting after mechanical fall down her front porch resulting in multiple fractures.  The patient had been in her usual state of health prior to mechanical fall.  The patient denied any aura, chest discomfort, shortness breath, dizziness prior to her fall.  Patient denies any syncope.  The patient initially presented to [facility], but she was transferred to [facility] for trauma surgery to evaluate as well as orthopedic surgery.  She has been evaluated by trauma surgery, Dr. Dema Severin. ED physician, Dr. Wyvonnia Dusky, spoke with Dr. Lucia Gaskins.  The patient was placed in right knee immobilizer, lower leg splint, right radius splint.  Patient herself denies any fever, chills, chest discomfort, shortness breath, dizziness, vomiting, diarrhea, abdominal pain, dysuria, hematuria. Pt admitted for further management.  Assessment/Plan: Mechanical fall resulting in multiple fractures -CT abdomen pelvis--negative for acute findings -CT right ankle== distal tib-fib fracture, tibial plafond fracture, second metatarsal fracture -CT right knee--fracture of the proximal tibia with mild compression of the lateral tibial plateau -X-ray right wrist--distal radius fracture, intra-articular -Orthopedics on board, for surgery on 05/15/18 -appreciate trauma eval, Dr. Dema Severin -Monitor closely post op  Coronary artery disease -No chest pain presently -Personally reviewed EKG--sinus rhythm, nonspecific ST changes -BMS stent placed 2001 -Okay to hold aspirin, Plavix temporarily for surgery--> restart after surgery -12/03/2016 echo EF 65-70%, grade 2 DD, no WMA -Switch metoprolol to IV until the patient is able to take po -Last heart catheterization 2017--in-stent stenosis of the  proximal LAD stent--medical therapy was recommended  Chronic diastolic CHF -Clinically compensated -Daily weights, strict I&O -11/28/2016 echo as above -Continue lasix  Rheumatoid arthritis -Continue home Plaquenil, methotrexate -Give stress dose steroids as the patient is on prednisone 10 mg daily  Essential hypertension -Switch metoprolol to IV until the patient is able to tolerate po -Restart Altace  Hyperlipidemia -Continue statin when the patient is able to take po  Hypokalemia -Replace prn    Disposition Plan: TBD  Family Communication:   Spouse updated at bedside 11/4  Consultants:  Ortho, Trauma surgeons  Code Status:  FULL   DVT Prophylaxis:  SCD   Procedures: Repair of fractures on 05/15/18  Antibiotics: None    Subjective: Pt still reports R ankle, knee and wrist pain. Denies any other complaints  Objective: Vitals:   05/15/18 0428 05/15/18 0442 05/15/18 0809 05/15/18 1200  BP:  (!) 130/54 (!) 131/54   Pulse:  78 78   Resp: 14  12 14   Temp:  98.2 F (36.8 C) 98.6 F (37 C)   TempSrc:  Oral Oral   SpO2: 93% 98% 100% 97%  Weight:      Height:        Intake/Output Summary (Last 24 hours) at 05/15/2018 1812 Last data filed at 05/15/2018 1736 Gross per 24 hour  Intake 1761.17 ml  Output 860 ml  Net 901.17 ml   Weight change:  Exam:  General: NAD   Cardiovascular: S1, S2 present  Respiratory: CTAB  Abdomen: Soft, nontender, nondistended, bowel sounds present  Musculoskeletal: No bilateral pedal edema noted, multiple fractures noted  Skin: Normal, bruising on L shoulder noted  Psychiatry: No new focal neurologic deficits noted    Data Reviewed: I have personally reviewed following labs and imaging studies Basic Metabolic Panel:  Recent Labs  Lab 05/14/18 0050 05/15/18 0443  NA 135 134*  K 3.4* 4.5  CL 101 103  CO2 25 24  GLUCOSE 142* 115*  BUN 14 12  CREATININE 0.66 0.54  CALCIUM 8.8* 8.8*  MG  --  2.1   Liver  Function Tests: No results for input(s): AST, ALT, ALKPHOS, BILITOT, PROT, ALBUMIN in the last 168 hours. No results for input(s): LIPASE, AMYLASE in the last 168 hours. No results for input(s): AMMONIA in the last 168 hours. Coagulation Profile: No results for input(s): INR, PROTIME in the last 168 hours. CBC: Recent Labs  Lab 05/14/18 0050 05/15/18 0443  WBC 18.7* 9.2  NEUTROABS 15.7*  --   HGB 11.2* 10.1*  HCT 35.4* 32.4*  MCV 94.7 95.6  PLT 317 196   Cardiac Enzymes: No results for input(s): CKTOTAL, CKMB, CKMBINDEX, TROPONINI in the last 168 hours. BNP: Invalid input(s): POCBNP CBG: No results for input(s): GLUCAP in the last 168 hours. HbA1C: No results for input(s): HGBA1C in the last 72 hours. Urine analysis:    Component Value Date/Time   COLORURINE STRAW (A) 12/02/2016 1743   APPEARANCEUR CLEAR 12/02/2016 1743   LABSPEC 1.005 12/02/2016 1743   PHURINE 5.0 12/02/2016 1743   GLUCOSEU NEGATIVE 12/02/2016 1743   HGBUR NEGATIVE 12/02/2016 1743   BILIRUBINUR NEGATIVE 12/02/2016 1743   KETONESUR NEGATIVE 12/02/2016 1743   PROTEINUR NEGATIVE 12/02/2016 1743   UROBILINOGEN 0.2 12/28/2006 0943   NITRITE NEGATIVE 12/02/2016 1743   LEUKOCYTESUR NEGATIVE 12/02/2016 1743   Sepsis Labs: @LABRCNTIP (procalcitonin:4,lacticidven:4) ) Recent Results (from the past 240 hour(s))  MRSA PCR Screening     Status: None   Collection Time: 05/14/18  9:50 PM  Result Value Ref Range Status   MRSA by PCR NEGATIVE NEGATIVE Final    Comment:        The GeneXpert MRSA Assay (FDA approved for NASAL specimens only), is one component of a comprehensive MRSA colonization surveillance program. It is not intended to diagnose MRSA infection nor to guide or monitor treatment for MRSA infections. Performed at [facility], [place] [address] [place], [place] [address]      Scheduled Meds: . [MAR Hold] allopurinol  400 mg Oral Daily  . [MAR Hold] atorvastatin  80 mg Oral q1800  .  [MAR Hold] folic acid  1 mg Oral Daily  . [MAR Hold] furosemide  40 mg Oral Daily  . [MAR Hold] gabapentin  300 mg Oral TID  . [MAR Hold] HYDROmorphone   Intravenous Q4H  . [MAR Hold] hydroxychloroquine  300 mg Oral Daily  . [MAR Hold] leflunomide  20 mg Oral Daily  . [MAR Hold] loratadine  10 mg Oral Daily  . [MAR Hold] methotrexate  10 mg Oral Q Sun  . [MAR Hold] metoprolol tartrate  2.5 mg Intravenous Q12H  . [MAR Hold] pantoprazole  80 mg Oral Q1200  . [MAR Hold] predniSONE  20 mg Oral Q breakfast  . [MAR Hold] ramipril  2.5 mg Oral Daily   Continuous Infusions: . [MAR Hold] sodium chloride 10 mL/hr at 05/14/18 0800  . lactated ringers 10 mL/hr at 05/15/18 1353    Procedures/Studies: Dg Chest 2 View  Result Date: 05/14/2018 CLINICAL DATA:  67 year old female with fall. History of CHF. EXAM: CHEST - 2 VIEW COMPARISON:  Chest CT dated 05/14/2018 FINDINGS: There is cardiomegaly with probable mild vascular congestion. No focal consolidation, pleural effusion, or pneumothorax. No acute osseous pathology. IMPRESSION: Cardiomegaly with probable mild vascular congestion. No  focal consolidation. Electronically Signed   By: Anner Crete M.D.   On: 05/14/2018 01:10   Dg Pelvis 1-2 Views  Result Date: 05/14/2018 CLINICAL DATA:  Fall EXAM: PELVIS - 1-2 VIEW COMPARISON:  None. FINDINGS: There is no evidence of pelvic fracture or diastasis. No pelvic bone lesions are seen. IMPRESSION: Negative. Electronically Signed   By: Kerby Moors M.D.   On: 05/14/2018 01:10   Dg Forearm Right  Result Date: 05/14/2018 CLINICAL DATA:  Fall.  Right wrist pain. EXAM: RIGHT FOREARM - 2 VIEW COMPARISON:  None. FINDINGS: There is a an acute intra-articular fracture deformity involving the distal radius. Dorsal angulation of the distal fracture fragments noted. No dislocation identified. IMPRESSION: 1. Acute fracture involves the dorsal aspect of the distal radius. Mild dorsal angulation of the distal fracture  fragments. Electronically Signed   By: Kerby Moors M.D.   On: 05/14/2018 01:07   Dg Wrist Complete Left  Result Date: 05/14/2018 CLINICAL DATA:  67 year old female with fall and left wrist pain. EXAM: LEFT WRIST - COMPLETE 3+ VIEW COMPARISON:  None. FINDINGS: There is no acute fracture or dislocation. The bones are osteopenic. No significant arthritic changes. There is soft tissue swelling over the wrist and ulna. No radiopaque foreign object or soft tissue gas. IMPRESSION: No acute fracture or dislocation. Electronically Signed   By: Anner Crete M.D.   On: 05/14/2018 00:13   Dg Wrist Complete Right  Result Date: 05/14/2018 CLINICAL DATA:  Pain after fall EXAM: RIGHT WRIST - COMPLETE 3+ VIEW COMPARISON:  None. FINDINGS: There is a comminuted mildly displaced fracture through the distal radial metaphysis. IMPRESSION: Comminuted mildly displaced fracture through the distal radial metaphysis. Electronically Signed   By: Dorise Bullion III M.D   On: 05/14/2018 00:12   Dg Knee 2 Views Right  Result Date: 05/14/2018 CLINICAL DATA:  Pain after fall EXAM: RIGHT KNEE - 1-2 VIEW COMPARISON:  None. FINDINGS: A hemarthrosis is identified. There is a depressed lateral tibial plateau fracture which is comminuted. IMPRESSION: Comminuted mildly depressed lateral tibial plateau fracture with resulting hemarthrosis. Electronically Signed   By: Dorise Bullion III M.D   On: 05/14/2018 00:08   Dg Tibia/fibula Right  Result Date: 05/15/2018 CLINICAL DATA:  67 year old female with tibial plateau fracture and right ankle fracture. Subsequent encounter. EXAM: RIGHT TIBIA AND FIBULA - 2 VIEW; DG C-ARM 61-120 MIN Fluoroscopic time: 3 minutes and 8 seconds. COMPARISON:  05/14/2018 CT. FINDINGS: Eleven intraoperative film submitted for review after surgery. Six intraoperative C-arm views of the right knee: Reduction of lateral tibial plateau fracture with sideplate and screws. Articular surface with better alignment  although with slight depression. Five intraoperative C-arm views of the right ankle: Reduction of fibular fracture with sideplate and screws. Screws transfix the distal fibula-tibia joint space. Reduction of medial malleolar fracture with screw. Evaluation of posterior tibial fracture limited secondary to overlying plate. IMPRESSION: Open reduction and internal fixation right knee and right ankle fractures as noted above. Electronically Signed   By: Genia Del M.D.   On: 05/15/2018 17:57   Dg Tibia/fibula Right  Result Date: 05/14/2018 CLINICAL DATA:  Pain after fall EXAM: RIGHT TIBIA AND FIBULA - 2 VIEW COMPARISON:  None. FINDINGS: There is a comminuted depressed fracture through the lateral tibial plateau. There is a comminuted fracture through the distal fibular diaphysis. There is a lucency through the distal medial malleolus with a step-off. These hemarthrosis in the suprapatellar fossa. IMPRESSION: 1. Depressed comminuted lateral tibial plateau fracture. 2.  Comminuted fracture through the distal fibular diaphysis. 3. Fracture through the distal medial malleolus. Electronically Signed   By: Dorise Bullion III M.D   On: 05/14/2018 00:14   Dg Ankle Complete Right  Result Date: 05/14/2018 CLINICAL DATA:  Pain after fall EXAM: RIGHT ANKLE - COMPLETE 3+ VIEW COMPARISON:  None. FINDINGS: There is a comminuted fracture through the distal fibular diaphysis. There is a fracture through the medial malleolus. There also be appears to be a fracture through the distal tibia where it abuts the fibula. Irregularity of the distal most fibula is consistent with an age indeterminate fracture but may not be acute. Soft tissue swelling. IMPRESSION: 1. Fracture through the medial malleolus. 2. Apparent fracture through the lateral aspect of the distal tibia with displacement. 3. Fracture through the fibular diaphysis distally. 4. Irregularity of the distal tip of the fibula may be nonacute from previous injury. 5. No  definitive posterior malleolar fracture. However, given the complex nature of the ankle fracture, consider a CT scan for better evaluation. Electronically Signed   By: Dorise Bullion III M.D   On: 05/14/2018 00:17   Ct Head Wo Contrast  Result Date: 05/13/2018 CLINICAL DATA:  67 year old female with facial trauma. EXAM: CT HEAD WITHOUT CONTRAST CT MAXILLOFACIAL WITHOUT CONTRAST CT CERVICAL SPINE WITHOUT CONTRAST TECHNIQUE: Multidetector CT imaging of the head, cervical spine, and maxillofacial structures were performed using the standard protocol without intravenous contrast. Multiplanar CT image reconstructions of the cervical spine and maxillofacial structures were also generated. COMPARISON:  Head CT dated 07/23/2007 FINDINGS: CT HEAD FINDINGS Brain: The ventricles and sulci appropriate size for patient's age. The gray-white matter discrimination is preserved. There is no acute intracranial hemorrhage. No mass effect or midline shift. No extra-axial fluid collection. Vascular: No hyperdense vessel or unexpected calcification. Skull: Normal. Negative for fracture or focal lesion. Other: None. CT MAXILLOFACIAL FINDINGS Osseous: No fracture or mandibular dislocation. No destructive process. Orbits: Negative. No traumatic or inflammatory finding. Sinuses: Clear. Soft tissues: Negative. CT CERVICAL SPINE FINDINGS Alignment: No acute subluxation. Straightening of normal cervical lordosis which may be positional or due to muscle spasm. Skull base and vertebrae: No acute fracture. Soft tissues and spinal canal: No prevertebral fluid or swelling. No visible canal hematoma. Disc levels:  Degenerative changes at C5-C6. Upper chest: Negative. Other: None IMPRESSION: 1. No acute intracranial pathology. 2. No acute facial bone fractures. 3. No acute/traumatic cervical spine pathology. Electronically Signed   By: Anner Crete M.D.   On: 05/13/2018 23:40   Ct Chest W Contrast  Result Date: 05/14/2018 CLINICAL DATA:   67 year old female with trauma. EXAM: CT CHEST, ABDOMEN, AND PELVIS WITH CONTRAST TECHNIQUE: Multidetector CT imaging of the chest, abdomen and pelvis was performed following the standard protocol during bolus administration of intravenous contrast. CONTRAST:  154mL ISOVUE-300 IOPAMIDOL (ISOVUE-300) INJECTION 61% COMPARISON:  CT of the abdomen pelvis dated 12/02/2016 FINDINGS: CT CHEST FINDINGS Cardiovascular: There is mild cardiomegaly. No pericardial effusion. Multi vessel coronary vascular calcification. Mild atherosclerotic calcification of the thoracic aorta. No aneurysmal dilatation or dissection. The visualized origins of the great vessels of the aortic arch are patent. The central pulmonary arteries are grossly unremarkable. Mediastinum/Nodes: No hilar or mediastinal adenopathy. Esophagus and thyroid gland are grossly unremarkable. No mediastinal fluid collection. Lungs/Pleura: Bibasilar linear atelectasis/scarring. The lungs are clear. There is no pleural effusion or pneumothorax. The central airways are patent. Musculoskeletal: No chest wall mass or suspicious bone lesions identified. CT ABDOMEN PELVIS FINDINGS No intra-abdominal free air or free  fluid. Hepatobiliary: The liver is unremarkable. Cholecystectomy. Mild intrahepatic biliary ductal dilatation. Pancreas: Unremarkable. No pancreatic ductal dilatation or surrounding inflammatory changes. Spleen: Normal in size without focal abnormality. Adrenals/Urinary Tract: The adrenal glands are unremarkable. Small nonobstructing right renal inferior pole hypodense focus which is too small to characterize. There is no hydronephrosis on either side. There is symmetric enhancement and excretion of contrast by both kidneys. The visualized ureters and urinary bladder appear unremarkable. Stomach/Bowel: There is sigmoid diverticulosis without active inflammatory changes. A gastric lap band is noted. There is no bowel obstruction or active inflammation. The appendix  is not visualized with certainty. No inflammatory changes identified in the right lower quadrant. Vascular/Lymphatic: Moderate aortoiliac atherosclerotic disease. No portal venous gas. There is no adenopathy. Reproductive: The uterus is anteverted and grossly unremarkable. Other: None Musculoskeletal: Grade 1 L4-L5 anterolisthesis. No acute osseous pathology. IMPRESSION: No acute intrathoracic, abdominal, or pelvic pathology. Electronically Signed   By: Anner Crete M.D.   On: 05/14/2018 02:42   Ct Cervical Spine Wo Contrast  Result Date: 05/13/2018 CLINICAL DATA:  67 year old female with facial trauma. EXAM: CT HEAD WITHOUT CONTRAST CT MAXILLOFACIAL WITHOUT CONTRAST CT CERVICAL SPINE WITHOUT CONTRAST TECHNIQUE: Multidetector CT imaging of the head, cervical spine, and maxillofacial structures were performed using the standard protocol without intravenous contrast. Multiplanar CT image reconstructions of the cervical spine and maxillofacial structures were also generated. COMPARISON:  Head CT dated 07/23/2007 FINDINGS: CT HEAD FINDINGS Brain: The ventricles and sulci appropriate size for patient's age. The gray-white matter discrimination is preserved. There is no acute intracranial hemorrhage. No mass effect or midline shift. No extra-axial fluid collection. Vascular: No hyperdense vessel or unexpected calcification. Skull: Normal. Negative for fracture or focal lesion. Other: None. CT MAXILLOFACIAL FINDINGS Osseous: No fracture or mandibular dislocation. No destructive process. Orbits: Negative. No traumatic or inflammatory finding. Sinuses: Clear. Soft tissues: Negative. CT CERVICAL SPINE FINDINGS Alignment: No acute subluxation. Straightening of normal cervical lordosis which may be positional or due to muscle spasm. Skull base and vertebrae: No acute fracture. Soft tissues and spinal canal: No prevertebral fluid or swelling. No visible canal hematoma. Disc levels:  Degenerative changes at C5-C6. Upper  chest: Negative. Other: None IMPRESSION: 1. No acute intracranial pathology. 2. No acute facial bone fractures. 3. No acute/traumatic cervical spine pathology. Electronically Signed   By: Anner Crete M.D.   On: 05/13/2018 23:40   Ct Knee Right Wo Contrast  Result Date: 05/14/2018 CLINICAL DATA:  67 year old female with fall and right knee pain. EXAM: CT OF THE right KNEE WITHOUT CONTRAST TECHNIQUE: Multidetector CT imaging of the right knee was performed according to the standard protocol. Multiplanar CT image reconstructions were also generated. COMPARISON:  Earlier radiograph dated 05/13/2018 FINDINGS: Bones/Joint/Cartilage There is a comminuted intra-articular fracture of the proximal tibia with mild depression of the lateral tibial plateau. The bones are osteopenic. There is no dislocation. There is a large suprapatellar lipohemarthrosis. Ligaments Suboptimally assessed by CT. Muscles and Tendons No acute findings. No intramuscular hematoma. Soft tissues There is diffuse subcutaneous stranding of the lateral knee. No fluid collection. IMPRESSION: 1. Comminuted intra-articular fracture of the proximal tibia with mild depression of the lateral tibial plateau. 2. Large suprapatellar lipohemarthrosis. Electronically Signed   By: Anner Crete M.D.   On: 05/14/2018 02:33   Ct Abdomen Pelvis W Contrast  Result Date: 05/14/2018 CLINICAL DATA:  67 year old female with trauma. EXAM: CT CHEST, ABDOMEN, AND PELVIS WITH CONTRAST TECHNIQUE: Multidetector CT imaging of the chest, abdomen and pelvis  was performed following the standard protocol during bolus administration of intravenous contrast. CONTRAST:  181mL ISOVUE-300 IOPAMIDOL (ISOVUE-300) INJECTION 61% COMPARISON:  CT of the abdomen pelvis dated 12/02/2016 FINDINGS: CT CHEST FINDINGS Cardiovascular: There is mild cardiomegaly. No pericardial effusion. Multi vessel coronary vascular calcification. Mild atherosclerotic calcification of the thoracic aorta.  No aneurysmal dilatation or dissection. The visualized origins of the great vessels of the aortic arch are patent. The central pulmonary arteries are grossly unremarkable. Mediastinum/Nodes: No hilar or mediastinal adenopathy. Esophagus and thyroid gland are grossly unremarkable. No mediastinal fluid collection. Lungs/Pleura: Bibasilar linear atelectasis/scarring. The lungs are clear. There is no pleural effusion or pneumothorax. The central airways are patent. Musculoskeletal: No chest wall mass or suspicious bone lesions identified. CT ABDOMEN PELVIS FINDINGS No intra-abdominal free air or free fluid. Hepatobiliary: The liver is unremarkable. Cholecystectomy. Mild intrahepatic biliary ductal dilatation. Pancreas: Unremarkable. No pancreatic ductal dilatation or surrounding inflammatory changes. Spleen: Normal in size without focal abnormality. Adrenals/Urinary Tract: The adrenal glands are unremarkable. Small nonobstructing right renal inferior pole hypodense focus which is too small to characterize. There is no hydronephrosis on either side. There is symmetric enhancement and excretion of contrast by both kidneys. The visualized ureters and urinary bladder appear unremarkable. Stomach/Bowel: There is sigmoid diverticulosis without active inflammatory changes. A gastric lap band is noted. There is no bowel obstruction or active inflammation. The appendix is not visualized with certainty. No inflammatory changes identified in the right lower quadrant. Vascular/Lymphatic: Moderate aortoiliac atherosclerotic disease. No portal venous gas. There is no adenopathy. Reproductive: The uterus is anteverted and grossly unremarkable. Other: None Musculoskeletal: Grade 1 L4-L5 anterolisthesis. No acute osseous pathology. IMPRESSION: No acute intrathoracic, abdominal, or pelvic pathology. Electronically Signed   By: Anner Crete M.D.   On: 05/14/2018 02:42   Ct Ankle Right Wo Contrast  Result Date: 05/14/2018 CLINICAL  DATA:  67 year old female with fall and right ankle pain. EXAM: CT OF THE RIGHT ANKLE WITHOUT CONTRAST TECHNIQUE: Multidetector CT imaging of the right ankle was performed according to the standard protocol. Multiplanar CT image reconstructions were also generated. COMPARISON:  Right ankle radiograph dated 05/13/2018 FINDINGS: Bones/Joint/Cartilage There is a comminuted fracture of the distal fibula with extension of the fracture into the distal tibia-fibular syndesmosis. There is a minimally displaced fracture of the lateral aspect of the tibial plafond and nondisplaced fracture of the posterior malleolus. There is a minimally displaced fracture of the medial malleolus. There bones are osteopenic. Discontinuity of the plantar cortex of the medial cuneiform (series 5, image 58 and sagittal series 6, image 89) concerning for an avulsion fracture. There is also linear lucency through the plantar base of the first metatarsal (series 6, image 90) also concerning for an avulsion corner fracture. Probable nondisplaced fracture of the dorsal aspect of the medial cuneiform (series 4, image 146 and sagittal series 6, image 69). Fracture of the base of the second metatarsal (series 6, image 73 and series 4, image 161). Nondisplaced linear lucency through the proximal fourth metatarsal (series 6, image 58) may be artifactual or represent a vascular groove or a nondisplaced fracture. No dislocation. Diffuse soft tissue edema. Ligaments Suboptimally assessed by CT. Muscles and Tendons No acute intramuscular findings. Soft tissues Diffuse subcutaneous edema. IMPRESSION: 1. Comminuted fracture of the distal fibula with extension of the fracture into the distal tibia-fibular syndesmosis. 2. Minimally displaced fracture of the lateral aspect of the tibial plafond and nondisplaced fracture of the posterior malleolus. 3. Minimally displaced fracture of the medial malleolus. 4.  Nondisplaced fracture of the base of the second  metatarsal. 5. Linear lucency through the plantar cortex of the medial cuneiform concerning for an avulsion fracture. 6. Linear lucency through the proximal fourth metatarsal may be artifactual or represent a nondisplaced fracture. Electronically Signed   By: Anner Crete M.D.   On: 05/14/2018 02:55   Dg Shoulder Left  Result Date: 05/14/2018 CLINICAL DATA:  General left shoulder pain.  Pain after fall. EXAM: LEFT SHOULDER - 2+ VIEW COMPARISON:  None. FINDINGS: There is no evidence of fracture or dislocation. There is no evidence of arthropathy or other focal bone abnormality. Soft tissues are unremarkable. IMPRESSION: Negative. Electronically Signed   By: Dorise Bullion III M.D   On: 05/14/2018 00:07   Dg Hand Complete Right  Result Date: 05/14/2018 CLINICAL DATA:  67 year old female with fall and trauma to the right hand. EXAM: RIGHT HAND - COMPLETE 3+ VIEW COMPARISON:  Right wrist radiograph dated 05/13/2018 FINDINGS: Comminuted mildly displaced intra-articular fracture of the distal radius as seen on the earlier wrist radiograph. No other acute fracture noted. The bones are osteopenic. No dislocation. Soft tissue swelling of the wrist. IMPRESSION: Comminuted mildly displaced intra-articular fracture of the distal radius. Electronically Signed   By: Anner Crete M.D.   On: 05/14/2018 01:09   Dg C-arm 1-60 Min  Result Date: 05/15/2018 CLINICAL DATA:  67 year old female with tibial plateau fracture and right ankle fracture. Subsequent encounter. EXAM: RIGHT TIBIA AND FIBULA - 2 VIEW; DG C-ARM 61-120 MIN Fluoroscopic time: 3 minutes and 8 seconds. COMPARISON:  05/14/2018 CT. FINDINGS: Eleven intraoperative film submitted for review after surgery. Six intraoperative C-arm views of the right knee: Reduction of lateral tibial plateau fracture with sideplate and screws. Articular surface with better alignment although with slight depression. Five intraoperative C-arm views of the right ankle:  Reduction of fibular fracture with sideplate and screws. Screws transfix the distal fibula-tibia joint space. Reduction of medial malleolar fracture with screw. Evaluation of posterior tibial fracture limited secondary to overlying plate. IMPRESSION: Open reduction and internal fixation right knee and right ankle fractures as noted above. Electronically Signed   By: Genia Del M.D.   On: 05/15/2018 17:57   Dg C-arm 1-60 Min  Result Date: 05/15/2018 CLINICAL DATA:  67 year old female with tibial plateau fracture and right ankle fracture. Subsequent encounter. EXAM: RIGHT TIBIA AND FIBULA - 2 VIEW; DG C-ARM 61-120 MIN Fluoroscopic time: 3 minutes and 8 seconds. COMPARISON:  05/14/2018 CT. FINDINGS: Eleven intraoperative film submitted for review after surgery. Six intraoperative C-arm views of the right knee: Reduction of lateral tibial plateau fracture with sideplate and screws. Articular surface with better alignment although with slight depression. Five intraoperative C-arm views of the right ankle: Reduction of fibular fracture with sideplate and screws. Screws transfix the distal fibula-tibia joint space. Reduction of medial malleolar fracture with screw. Evaluation of posterior tibial fracture limited secondary to overlying plate. IMPRESSION: Open reduction and internal fixation right knee and right ankle fractures as noted above. Electronically Signed   By: Genia Del M.D.   On: 05/15/2018 17:57   Ct Maxillofacial Wo Contrast  Result Date: 05/13/2018 CLINICAL DATA:  67 year old female with facial trauma. EXAM: CT HEAD WITHOUT CONTRAST CT MAXILLOFACIAL WITHOUT CONTRAST CT CERVICAL SPINE WITHOUT CONTRAST TECHNIQUE: Multidetector CT imaging of the head, cervical spine, and maxillofacial structures were performed using the standard protocol without intravenous contrast. Multiplanar CT image reconstructions of the cervical spine and maxillofacial structures were also generated. COMPARISON:  Head CT  dated 07/23/2007 FINDINGS: CT HEAD FINDINGS Brain: The ventricles and sulci appropriate size for patient's age. The gray-white matter discrimination is preserved. There is no acute intracranial hemorrhage. No mass effect or midline shift. No extra-axial fluid collection. Vascular: No hyperdense vessel or unexpected calcification. Skull: Normal. Negative for fracture or focal lesion. Other: None. CT MAXILLOFACIAL FINDINGS Osseous: No fracture or mandibular dislocation. No destructive process. Orbits: Negative. No traumatic or inflammatory finding. Sinuses: Clear. Soft tissues: Negative. CT CERVICAL SPINE FINDINGS Alignment: No acute subluxation. Straightening of normal cervical lordosis which may be positional or due to muscle spasm. Skull base and vertebrae: No acute fracture. Soft tissues and spinal canal: No prevertebral fluid or swelling. No visible canal hematoma. Disc levels:  Degenerative changes at C5-C6. Upper chest: Negative. Other: None IMPRESSION: 1. No acute intracranial pathology. 2. No acute facial bone fractures. 3. No acute/traumatic cervical spine pathology. Electronically Signed   By: Anner Crete M.D.   On: 05/13/2018 23:40    Alma Friendly, MD [facility]  If 7PM-7AM, please contact night-coverage [email or web address] 05/15/2018, 6:12 PM   LOS: 1 day

## 2018-05-15 NOTE — Anesthesia Procedure Notes (Signed)
Procedure Name: Intubation Date/Time: 05/15/2018 3:17 PM Performed by: White, Amedeo Plenty, CRNA Pre-anesthesia Checklist: Patient identified, Emergency Drugs available, Suction available and Patient being monitored Patient Re-evaluated:Patient Re-evaluated prior to induction Oxygen Delivery Method: Circle System Utilized Preoxygenation: Pre-oxygenation with 100% oxygen Induction Type: IV induction Ventilation: Mask ventilation without difficulty Laryngoscope Size: 3 and Mac Grade View: Grade II Tube type: Oral Tube size: 7.0 mm Number of attempts: 1 Airway Equipment and Method: Stylet and Oral airway Placement Confirmation: ETT inserted through vocal cords under direct vision,  positive ETCO2 and breath sounds checked- equal and bilateral Secured at: 19 cm Tube secured with: Tape Dental Injury: Teeth and Oropharynx as per pre-operative assessment

## 2018-05-15 NOTE — Anesthesia Preprocedure Evaluation (Addendum)
Anesthesia Evaluation  Patient identified by MRN, date of birth, ID band Patient awake    Reviewed: Allergy & Precautions, H&P , NPO status , Patient's Chart, lab work & pertinent test results, reviewed documented beta blocker date and time   History of Anesthesia Complications (+) PONV  Airway Mallampati: III  TM Distance: >3 FB Neck ROM: Full    Dental no notable dental hx. (+) Teeth Intact, Dental Advisory Given   Pulmonary neg pulmonary ROS,    Pulmonary exam normal breath sounds clear to auscultation       Cardiovascular hypertension, Pt. on medications and Pt. on home beta blockers + CAD, + Cardiac Stents and +CHF   Rhythm:Regular Rate:Normal     Neuro/Psych CVA, No Residual Symptoms negative psych ROS   GI/Hepatic negative GI ROS, Neg liver ROS,   Endo/Other  Morbid obesity  Renal/GU negative Renal ROS  negative genitourinary   Musculoskeletal  (+) Arthritis , Osteoarthritis,    Abdominal   Peds  Hematology negative hematology ROS (+)   Anesthesia Other Findings   Reproductive/Obstetrics negative OB ROS                            Anesthesia Physical Anesthesia Plan  ASA: III  Anesthesia Plan: General   Post-op Pain Management:    Induction: Intravenous  PONV Risk Score and Plan: 4 or greater and Ondansetron, Dexamethasone, Midazolam, Treatment may vary due to age or medical condition and Scopolamine patch - Pre-op  Airway Management Planned: Oral ETT  Additional Equipment:   Intra-op Plan:   Post-operative Plan: Extubation in OR  Informed Consent: I have reviewed the patients History and Physical, chart, labs and discussed the procedure including the risks, benefits and alternatives for the proposed anesthesia with the patient or authorized representative who has indicated his/her understanding and acceptance.   Dental advisory given  Plan Discussed with:  CRNA  Anesthesia Plan Comments:        Anesthesia Quick Evaluation

## 2018-05-15 NOTE — Transfer of Care (Signed)
Immediate Anesthesia Transfer of Care Note  Patient: Cindy Holmes  Procedure(s) Performed: OPEN REDUCTION INTERNAL FIXATION (ORIF) TIBIAL PLATEAU (Right ) OPEN REDUCTION INTERNAL FIXATION (ORIF) ANKLE FRACTURE (Right ) OPEN REDUCTION INTERNAL FIXATION (ORIF) DISTAL RADIAL FRACTURE (Right Wrist)  Patient Location: PACU  Anesthesia Type:General  Level of Consciousness: awake, alert , oriented and patient cooperative  Airway & Oxygen Therapy: Patient Spontanous Breathing and Patient connected to nasal cannula oxygen  Post-op Assessment: Report given to RN, Post -op Vital signs reviewed and stable and Patient moving all extremities  Post vital signs: Reviewed and stable  Last Vitals:  Vitals Value Taken Time  BP 111/67 05/15/2018  7:28 PM  Temp    Pulse 124 05/15/2018  7:30 PM  Resp 27 05/15/2018  7:30 PM  SpO2 94 % 05/15/2018  7:30 PM  Vitals shown include unvalidated device data.  Last Pain:  Vitals:   05/15/18 1200  TempSrc:   PainSc: 3       Patients Stated Pain Goal: 4 (57/32/20 2542)  Complications: No apparent anesthesia complications

## 2018-05-15 NOTE — Progress Notes (Signed)
Pt consent obtained for blood and surgery. Spouse at bedside and signed forms d/t fracture to dominant extremity. PCA pump paused and disconnected for transport. Report called to Robbi in short stay.

## 2018-05-15 NOTE — Consult Note (Signed)
Orthopaedic Trauma Service (OTS) Consult   Patient ID: Cindy Holmes MRN: [id number] DOB/AGE: [id number] y.o.  Reason for Consult:Multiple fractures Referring Physician: Dr. Radene Journey, MD [facility]  HPI: Cindy Holmes is an 67 y.o. female who is being seen in consultation at the request of Dr. Lucia Gaskins for evaluation of multiple orthopedic injuries.  The patient has a history of RA, lupus, peripheral artery disease and coronary artery disease with heart failure and a history of stroke.  She fell down the stairs approximately 6 steps.  She had pain in her wrist and right lower extremity.  X-rays were obtained which showed a right distal radius fracture right tibial plateau fracture and a right bimalleolar ankle fracture.  Dr. Lucia Gaskins was consulted.  He felt that this was outside the scope of practice and requested that orthopedic trauma service be involved.  Patient was ambulatory without assistive device prior to this.  She is on medications for her rheumatoid arthritis and lupus.  She was active before this injury and was even painting on a ladder before this happened.  Her husband is at bedside during evaluation.  Denies any recent problems with heart disease or her her lupus.  She has not been hospitalized in the last 6 months.  Past Medical History:  Diagnosis Date  . CAD (coronary artery disease)    a. PTCA LAD 2001/cath 2002-prox and mid LAD stents patent, PTCA ostium Dx 1 b. cath 03/2016: patent LAD stent with less than 40% in-stent restenosis, 10-30% stenosis of D1 and distal LAD with continued medical management recommended.  . Candida esophagitis ([place])   . Carotid artery disease ([place])    Doppler September, 2011, 0-39% bilateral disease mild  . Chronic diastolic CHF (congestive heart failure) ([place])   . Edema   . Emotional stress reaction    8/11  . HTN (hypertension)   . Hyperlipidemia   . Leg pain    July, 2012, Arterial Dopplers March 08, 2011 normal  .  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 PONV (postoperative nausea and vomiting)   . Rheumatoid arthritis ([place])   . Right rotator cuff tear 11/16/2013  . Stroke [facility])    mini stroke in past ???    Past Surgical History:  Procedure Laterality Date  . CARDIAC CATHETERIZATION     with stent placement in 2001  . CARDIAC CATHETERIZATION N/A 03/12/2016   Procedure: Left Heart Cath and Coronary Angiography;  Surgeon: Nelva Bush, MD;  Location: [facility];  Service: Cardiovascular;  Laterality: N/A;  . CARDIAC CATHETERIZATION N/A 03/12/2016   Procedure: Intravascular Pressure Wire/FFR Study;  Surgeon: Nelva Bush, MD;  Location: [facility];  Service: Cardiovascular;  Laterality: N/A;  . CHOLECYSTECTOMY    . CORONARY ANGIOPLASTY     2002  . ESOPHAGOGASTRODUODENOSCOPY N/A 12/03/2016   Procedure: ESOPHAGOGASTRODUODENOSCOPY (EGD);  Surgeon: Carol Ada, MD;  Location: [facility];  Service: Endoscopy;  Laterality: N/A;  . laproscopic adjustable gastric  banding     with APS standard system.   . Pt has 3 stents     sept 2001  . SHOULDER ARTHROSCOPY WITH SUBACROMIAL DECOMPRESSION, ROTATOR CUFF REPAIR AND BICEP TENDON REPAIR Right 11/16/2013   Procedure: RIGHT SHOULDER ARTHROSCOPY, EXTENSIVE DEBRIDEMENT, ROTATOR CUFF REPAIR ;  Surgeon: [name], MD;  Location: [facility];  Service: Orthopedics;  Laterality: Right;  .  TUBAL LIGATION      Family History  Problem Relation Age of Onset  . Heart attack Mother   . Heart disease Mother   . Diabetes Brother   . Heart disease Brother   . Diabetes Sister   . Arthritis/Rheumatoid Father   . Cirrhosis Father        hepatic cirrhosis  . Heart disease Maternal Aunt   . Heart attack Maternal Aunt   . Throat cancer Maternal Aunt   . Heart attack Maternal Uncle   . Heart disease Brother   . Cancer  Unknown        family history  . Heart attack Unknown        family history  . Colon cancer Maternal Grandmother   . Throat cancer Maternal Grandfather     Social History:  reports that she has never smoked. She has never used smokeless tobacco. She reports that she does not drink alcohol or use drugs.  Allergies:  Allergies  Allergen Reactions  . Codeine Phosphate Shortness Of Breath    REACTION: unspecified  . Amoxicillin Nausea And Vomiting    REACTION: unspecified  . Penicillins Nausea And Vomiting    Has patient had a PCN reaction causing immediate rash, facial/tongue/throat swelling, SOB or lightheadedness with hypotension:YES Has patient had a PCN reaction causing severe rash involving mucus membranes or skin necrosis: NO Has patient had a PCN reaction that required hospitalization NO Has patient had a PCN reaction occurring within the last 10 years: NO If all of the above answers are "NO", then may proceed with Cephalosporin use.  Marland Kitchen Morphine And Related Nausea And Vomiting  . Lidocaine Rash    Patch caused rash    Medications:  No current facility-administered medications on file prior to encounter.    Current Outpatient Medications on File Prior to Encounter  Medication Sig Dispense Refill  . allopurinol (ZYLOPRIM) 100 MG tablet Take 400 mg by mouth daily.    Marland Kitchen alum & mag hydroxide-simeth (MAALOX/MYLANTA) 200-200-20 MG/5ML suspension Take 30 mLs by mouth every 4 (four) hours as needed for indigestion or heartburn. 355 mL 0  . aspirin 81 MG tablet Take 81 mg by mouth daily.      Marland Kitchen atorvastatin (LIPITOR) 40 MG tablet Take 2 tablets (80 mg total) by mouth daily at 6 PM. 180 tablet 3  . clopidogrel (PLAVIX) 75 MG tablet Take 1 tablet (75 mg total) by mouth daily. 90 tablet 3  . esomeprazole (NEXIUM) 40 MG capsule Take 1 capsule (40 mg total) by mouth 2 (two) times daily before a meal. For acid reflux 26 capsule 0  . ezetimibe (ZETIA) 10 MG tablet Take 1 tablet (10 mg  total) by mouth daily. 90 tablet 3  . folic acid (FOLVITE) 1 MG tablet Take 1 mg by mouth daily. Takes with chemo medication.    . furosemide (LASIX) 40 MG tablet TAKE 1 TABLET BY MOUTH  DAILY AND MAY TAKE AN  ADDITIONAL 1 TABLET DAILY  AS NEEDED FOR SWELLING. 180 tablet 3  . hydroxychloroquine (PLAQUENIL) 200 MG tablet Take 300 mg by mouth daily.    Marland Kitchen leflunomide (ARAVA) 20 MG tablet Take 20 mg by mouth daily.    Marland Kitchen levocetirizine (XYZAL) 5 MG tablet Take 1 tablet by mouth daily.    . methotrexate (RHEUMATREX) 2.5 MG tablet Take 10 mg by mouth once a week. Takes on Sundays.    . metoprolol tartrate (LOPRESSOR) 50 MG tablet TAKE 1 TABLET BY MOUTH ONCE  DAILY 90 tablet 3  . nitroGLYCERIN (NITROSTAT) 0.4 MG SL tablet DISSOLVE 1 TABLET UNDER THE TONGUE EVERY 5 MINUTES AS  NEEDED (NOT TO EXCEED 3  TABLETS IN 15 MINS, CALL  911 IF CHEST PAIN PERSISTS 50 tablet 0  . Omega-3 Fatty Acids (FISH OIL) 1000 MG CAPS Take 1 capsule by mouth daily.     . predniSONE (DELTASONE) 5 MG tablet Take 10 mg by mouth daily with breakfast.     . ramipril (ALTACE) 2.5 MG capsule Take 1 capsule (2.5 mg total) by mouth daily. 90 capsule 3  . senna-docusate (SENOKOT-S) 8.6-50 MG tablet Take 1 tablet by mouth daily as needed for mild constipation.      ZOX:[id number]: No fever or chills Vision: No changes in vision ENT: No difficulty swallowing CV: No chest pain Pulm: No SOB or wheezing GI: No nausea or vomiting GU: No urgency or inability to hold urine Skin: No poor wound healing Neurologic: No numbness or tingling Psychiatric: No depression or anxiety Heme: No bruising Allergic: No reaction to medications or food   Exam: Blood pressure (!) 130/54, pulse 78, temperature 98.2 F (36.8 C), temperature source Oral, resp. rate 14, height _0  (1.6 m), weight 100.3 kg, SpO2 98 %. General: No acute distress Orientation: Awake alert and oriented x3 Mood and Affect: Cooperative and pleasant Gait: Unable to ambulate  secondary to fractures and pain Coordination and balance: Within normal limits  Right lower extremity: Knee immobilizer is in place.  The ankle is in a short leg splint.  She is able to wiggle her toes she has sensation to her toes.  Unable to move her knee secondary to pain in her fracture.  No pain at her hip.  Warm and well-perfused foot with brisk cap refill.  Right upper extremity: Sugar tong splint is in place.  And slid down a little bit.  However she is able to wiggle fingers with thumb motor and sensory function intact.  Compartments are soft compressible.  No pain at the elbow or shoulder.  No obvious deformities.  No instability.  Left lower extremity and left upper extremity: Skin without lesions. No tenderness to palpation. Full painless ROM, full strength in each muscle groups without evidence of instability.   Medical Decision Making: Imaging: X-rays of the right ankle and CT scan are reviewed which shows a right trimalleolar ankle fracture with a relatively well appearing ankle mortise.  She has a high Weber C fibula fracture with involvement of the syndesmosis.  X-rays and CT scan of the right tibial plateau were evaluated.  It shows a split depression lateral plateau fracture with significant joint involvement with widening of the condyles.  X-rays of the right distal radius and wrist show intra-articular distal radius fracture with involvement of the dorsal portion of the radiocarpal joint.  Labs:  Results for orders placed or performed during the hospital encounter of 05/13/18 (from the past 48 hour(s))  CBC with Differential/Platelet     Status: Abnormal   Collection Time: 05/14/18 12:50 AM  Result Value Ref Range   WBC 18.7 (H) 4.0 - 10.5 K/uL   RBC 3.74 (L) 3.87 - 5.11 MIL/uL   Hemoglobin 11.2 (L) 12.0 - 15.0 g/dL   HCT 35.4 (L) 36.0 - 46.0 %   MCV 94.7 80.0 - 100.0 fL   MCH 29.9 26.0 - 34.0 pg   MCHC 31.6 30.0 - 36.0 g/dL   RDW 14.7 11.5 - 15.5 %   Platelets 317  150 -  400 K/uL   nRBC 0.0 0.0 - 0.2 %   Neutrophils Relative % 85 %   Neutro Abs 15.7 (H) 1.7 - 7.7 K/uL   Lymphocytes Relative 9 %   Lymphs Abs 1.6 0.7 - 4.0 K/uL   Monocytes Relative 6 %   Monocytes Absolute 1.2 (H) 0.1 - 1.0 K/uL   Eosinophils Relative 0 %   Eosinophils Absolute 0.1 0.0 - 0.5 K/uL   Basophils Relative 0 %   Basophils Absolute 0.1 0.0 - 0.1 K/uL   Immature Granulocytes 0 %   Abs Immature Granulocytes 0.08 (H) 0.00 - 0.07 K/uL    Comment: Performed at [facility], [address] [place], [place] [address]  Basic metabolic panel     Status: Abnormal   Collection Time: 05/14/18 12:50 AM  Result Value Ref Range   Sodium 135 135 - 145 mmol/L   Potassium 3.4 (L) 3.5 - 5.1 mmol/L   Chloride 101 98 - 111 mmol/L   CO2 25 22 - 32 mmol/L   Glucose, Bld 142 (H) 70 - 99 mg/dL   BUN 14 8 - 23 mg/dL   Creatinine, Ser 0.66 0.44 - 1.00 mg/dL   Calcium 8.8 (L) 8.9 - 10.3 mg/dL   GFR calc non Af Amer >60 >60 mL/min   GFR calc Af Amer >60 >60 mL/min    Comment: (NOTE) The eGFR has been calculated using the CKD EPI equation. This calculation has not been validated in all clinical situations. eGFR's persistently <60 mL/min signify possible Chronic Kidney Disease.    Anion gap 9 5 - 15    Comment: Performed at [facility], [address], [place], [place] [address]  Type and screen [place]     Status: None   Collection Time: 05/14/18  4:26 AM  Result Value Ref Range   ABO/RH(D) A POS    Antibody Screen NEG    Sample Expiration      05/17/2018 Performed at [facility], [place] [address], [place], [place] [address]   ABO/Rh     Status: None   Collection Time: 05/14/18  4:26 AM  Result Value Ref Range   ABO/RH(D)      A POS Performed at [facility] [address], [place],  [address]   HIV antibody (Routine Testing)     Status: None   Collection Time: 05/14/18  4:27 AM  Result Value Ref Range   HIV Screen 4th Generation wRfx Non  Reactive Non Reactive    Comment: (NOTE) Performed At: [facility] [place], [place] [id number] Rush Farmer MD PO:[id number]   MRSA PCR Screening     Status: None   Collection Time: 05/14/18  9:50 PM  Result Value Ref Range   MRSA by PCR NEGATIVE NEGATIVE    Comment:        The GeneXpert MRSA Assay (FDA approved for NASAL specimens only), is one component of a comprehensive MRSA colonization surveillance program. It is not intended to diagnose MRSA infection nor to guide or monitor treatment for MRSA infections. Performed at [facility], [place] [address], [place] [address]   CBC     Status: Abnormal   Collection Time: 05/15/18  4:43 AM  Result Value Ref Range   WBC 9.2 4.0 - 10.5 K/uL   RBC 3.39 (L) 3.87 - 5.11 MIL/uL   Hemoglobin 10.1 (L) 12.0 - 15.0 g/dL   HCT 32.4 (L) 36.0 - 46.0 %  MCV 95.6 80.0 - 100.0 fL   MCH 29.8 26.0 - 34.0 pg   MCHC 31.2 30.0 - 36.0 g/dL   RDW 14.6 11.5 - 15.5 %   Platelets 196 150 - 400 K/uL   nRBC 0.0 0.0 - 0.2 %    Comment: Performed at [facility], [place] [address] [place], [place] [address]    Medical history and chart was reviewed  Assessment/Plan: 67 year old female with multiple medical comorbidities including RA, lupus coronary artery disease, peripheral artery disease with a fall downstairs.  Orthopedic injuries include:  1.  Intra-articular right distal radius fracture 2.  Intra-articular split depression lateral tibial plateau fracture on the right 3.  Right trimalleolar ankle fracture 4.  Right medial cuneiform avulsion fracture and a second metatarsal base fracture.  With her constellation of injuries along with her previous ambulatory capabilities without a walker or cane I recommend proceeding with open reduction internal fixation of right tibial plateau, right ankle and right distal radius.  Risks and benefits were discussed with the patient.  I feel that she is at high risk for  wound healing complications and infection however I feel that the risks do not outweigh the benefits of proceeding with surgical fixation to improve her alignment and allow her to probably start weightbearing earlier.  She will likely be nonweightbearing on the right lower extremity for 6 weeks.  She will also be able to weight-bear through the elbow on her right side following surgery. Risks discussed included bleeding requiring blood transfusion, bleeding causing a hematoma, infection, malunion, nonunion, damage to surrounding nerves and blood vessels, pain, hardware prominence or irritation, hardware failure, stiffness, post-traumatic arthritis, DVT/PE, compartment syndrome, and even death.  Patient and her husband agree to proceed with surgery and consent was obtained.   Shona Needles, MD [facility] [phone] (phone)

## 2018-05-15 NOTE — Op Note (Signed)
OrthopaedicSurgeryOperativeNote [phone]) Date of Surgery: 05/15/2018  Admit Date: 05/13/2018   Diagnoses: Pre-Op Diagnoses: Right lateral tibial plateau fracture Right trimalleolar ankle fracture Right intra-articular distal radius fracture   Post-Op Diagnosis: Same  Procedures: 1. CPT 27535-Open reduction internal fixation of right lateral tibial plateau fracture 2. CPT 56213-YQMV reduction internal fixation of right trimalleolar ankle fracture without posterior malleolus fixation 3. CPT 27829-Open reduction internal fixation of right syndesmosis 4. CPT 25608-Open reduction internal fixation of right intra-articular distal radius fracture  Surgeons: Primary: Shona Needles, MD   Location:[facility] OR ROOM 09   AnesthesiaGeneral   Antibiotics:Ancef 2g preop   Tourniquettime: Total Tourniquet Time Documented: Thigh (Right) - 39 minutes Total: Thigh (Right) - 39 minutes  [id number]:102 mL   Complications:None  Specimens:None  Implants: Implant Name Type Inv. Item Serial No. Manufacturer Lot No. LRB No. Used Action  SCREW CORT HEADED ST 3.5X30 - [id number] Screw SCREW CORT HEADED ST 3.5X30  [employer]  Right 2 Implanted  PLATE TIBIA VA-LCP 6H RT - [id number] Plate PLATE TIBIA VA-LCP 6H RT  [employer]  Right 1 Implanted  SCREW HEADED ST 3.5X80 - [id number] Screw SCREW HEADED ST 3.5X80  [employer]  Right 1 Implanted  SCREW LOCKING VA 3.5X75MM - [id number] Screw SCREW LOCKING VA 3.5X75MM  [employer]  Right 1 Implanted  SCREW LOCKING 3.5X70MM VA - [id number] Screw SCREW LOCKING 3.5X70MM VA  [employer]  Right 3 Implanted  PLATE RECONSTRUCTION - [id number] Plate PLATE RECONSTRUCTION  [employer]  Right 1 Implanted  SCREW HEADED ST 3.5X65 - [id number] Screw SCREW HEADED ST 3.5X65  [employer]  Right 1 Implanted  SCREW HEADED ST 3.5X55 - [id number] Screw SCREW HEADED ST 3.5X55  [employer]  Right 1 Implanted  SCREW CORTEX 2.7X16MM -  [id number] Screw SCREW CORTEX 2.7X16MM  [employer]  Right 1 Implanted  SCREW CORTEX 3.5 16MM - [id number] Screw SCREW CORTEX 3.5 16MM  [employer]  Right 2 Implanted  SCREW CORTEX 3.5 18MM - [id number] Screw SCREW CORTEX 3.5 18MM  [employer]  Right 2 Implanted  SCREW CORTEX 3.5 50MM - [id number] Screw SCREW CORTEX 3.5 50MM  [employer]  Right 2 Implanted  PLATE RT VA 2COL DIST RAD 6HX3 - [id number] Plate PLATE RT VA 2COL DIST RAD 6HX3  [employer]  Right 1 Implanted  SCREW SELF TAP 40M - [id number] Screw SCREW SELF TAP 40M  [employer]  Right 3 Implanted  SCREW LOCKING 2.4X20 - [id number] Screw SCREW LOCKING 2.4X20  [employer]  Right 1 Implanted  SCREW LOCKING 2.4X22 - [id number] Screw SCREW LOCKING 2.4X22  [employer]  Right 3 Implanted    IndicationsforSurgery: 67 year old female with multiple medical comorbidities including RA, lupus coronary artery disease, peripheral artery disease with a fall downstairs.  Orthopedic injuries include:  1.  Intra-articular right distal radius fracture 2.  Intra-articular split depression lateral tibial plateau fracture on the right 3.  Right trimalleolar ankle fracture 4.  Right medial cuneiform avulsion fracture and a second metatarsal base fracture.  With her constellation of injuries along with her previous ambulatory capabilities without a walker or cane I recommend proceeding with open reduction internal fixation of right tibial plateau, right ankle and right distal radius.  Risks and benefits were discussed with the patient.  I feel that she is at high risk for wound healing complications and infection however I feel that the risks do not outweigh the benefits of proceeding with surgical fixation to improve her alignment and allow her  to probably start weightbearing earlier.  She will likely be nonweightbearing on the right lower extremity for 6 weeks.  She will also be able to weight-bear through the elbow on her right side  following surgery. Risks discussed included bleeding requiring blood transfusion, bleeding causing a hematoma, infection, malunion, nonunion, damage to surrounding nerves and blood vessels, pain, hardware prominence or irritation, hardware failure, stiffness, post-traumatic arthritis, DVT/PE, compartment syndrome, and even death.  Patient and her husband agree to proceed with surgery and consent was obtained.  Operative Findings: 1.  Open reduction internal fixation of right lateral plateau fracture using a Synthes proximal tibia LCP plate 2.  Open reduction internal fixation of right trimalleolar ankle fracture with no posterior malleolus fixation.  Use of a Synthes recon plate for the fibula with a 2.7 mm lag screw.  He will malleolus was fixed with percutaneous 3.5 millimeters screws. 3.  Open reduction internal fixation of right distal radius fracture using Synthes distal radial volar plate.  Procedure: The patient was identified in the preoperative holding area. Consent was confirmed with the patient and their family and all questions were answered. The operative extremity was marked after confirmation with the patient. she was then brought back to the operating room by our anesthesia colleagues. She was placed under general anesthesia and carefully transferred to a radiolucent flat top table. A foley catheter was placed. A bump was placed under her operative hip. A non sterile tourniquet was placed. The operative extremity was then prepped and draped in usual sterile fashion. A preoperative timeout was performed to verify the patient, the procedure, and the extremity. Preoperative antibiotics were dosed.  Fluoroscopy was used evaluate the injury and were saved. An esmarch was used to exsanguinate the leg and it was inflated to 340mmHg. An anterolateral parapatellar incision was performed and carried through skin and subcutaneous tissue. The overlying fascia was incised in line with the skin incision  just lateral to the patellar tendon. It was extended distally into the anterior compartment fascia. Bovie electrocautery was then used to elevated the IT band and musculature off of the anterolateral cortex of the tibia. The release was taken back until the fibular head was palpable. The plane between the IT band and the lateral capsule was developed and the anterior fat pad was resected to expose the capsule.  A submensical arthrotomy was then performed with a 15 blade. Tag sutures were used to retract the capsule and here I was able to visualize the impacted lateral joint and was able to see the meniscus. There was no meniscus tear visualized.  There was a split in the anterolateral cortex that was entered into with a Cobb elevator. The clot was debrided and bone tamp was used to start elevating the articular surface of the lateral tibial plateau. This was done with assistance by fluoroscopy in both the lateral and AP. Once I had elevated the joint sufficiently both visualized and fluoroscopically, I provisionally pinned the joint with 1.15mm K-wires.  A 3.87mm LCP proximal tibial locking plate was chosen and was aligned to the tibia in both the AP and lateral fluoroscopic views. A ball spike pusher was used to compress the plate to the lateral plateau and held with K-wires. A non-locking screw was placed in the proximal segment to bring the plate flush with bone. Nonlocking screws were placed in the tibial shaft through percutaneous incisions. Locking screws were then drilled and placed crossing both the lateral and medial plateau, thereby rafting the impacted lateral joint.  A buttress locking screw was placed.  I then turned my attention to the right ankle.  An incision was marked out and I made a direct lateral approach to the fibula.  I took care to carefully protect the superficial peroneal nerve.  I performed a subperiosteal dissection down to the fibula fracture.  I cleaned this out and then clamped  it in place.  A 2.7 mm lag screw was placed from anterior to posterior.  I then contoured a Synthes recon plate to fit along the lateral aspect of the fibula and wraparound to the posterior aspect of the distal fibula.  I placed a nonlocking screws above and below the fracture.  I placed 3 screws above the fracture one screw below the fracture and then proceeded to manually compressed the syndesmosis in place to Quadricortical syndesmotic screws to complete the construct.  I then turned my attention to the medial malleolus which was relatively nondisplaced.  I made 2 percutaneous incisions and used a 2.5 mm drill bit to bicortically drilled the tibia and obtained excellent purchase.  Final fluoroscopic images were obtained.  An external rotation stress view was obtained which showed no syndesmotic widening.  Hemostasis was obtained. The incisions was thoroughly irrigated. The tag sutures of the plateau for the capsule were brought through the plate and tied down and the meniscus repair sutures were brought through the IT band. One gram of vancomycin powder was placed in both wounds and the IT band and anterior tibialis fascia was closed with 0-vicryl suture. The skin was closed with 2-0 vicryl and 3-0 nylon. The percutaneous incisions were closed with 3-0 nylon suture.  In the ankle incision was closed with 2-0 Vicryl and 3-0 nylon.  Sterile dressings consisting of bacitracin ointment, Adaptic, 4 x 4's and sterile cast padding were placed.  A short leg splint was then placed to the lower extremity and we turned our attention to the right wrist.  Fluoroscopy images were used to visualize the fracture.  An Esmarch was used to exsanguinate the extremity and then the tourniquet was inflated to 250 mmHg.  A standard FCR approach was performed.  The skin and subcutaneous tissue.  The fascia overlying the FCR was incised.  The FCR tendon was moved out of the way in the dorsal sheath was incised as well.  The finger  flexors were swept out of the way and the pronator quadratus was visualized.  This was taken down from the radial border using a 15 blade and periosteal elevator.  A reduction maneuver was performed to improve the alignment of the distal articular segment.  A percutaneous K wire was placed from volar to dorsal to fix the intra-articular split.  I made a small percutaneous incision on the dorsal aspect of the wrist to clamp the intra-articular split together while I placed these K wires..  When the distal radius was provisionally fixed with a K wire was a volar plate was appropriately positioned in the AP and lateral fluoroscopic imaging.  It was pinned in place was then drilled and placed in the oblong hole of the radial shaft.  I then proceeded to place the distal fixation with locking screws.  I used fluoroscopy to confirm that they were extra-articular.  A total of 4 locking screws were placed into the distal segment.  Another 2 nonlocking screws were placed into the proximal shaft segment.  This completed the fixation construct.  Final fluoroscopic images were obtained.  The incision was thoroughly irrigated.  A  gram of vancomycin powder was placed into the wound.  The quadratus was left unrepaired.  The skin was then closed with 2-0 Vicryl and 3-0 nylon suture.  A sterile dressing consisting of Adaptic, 4 x 4's, sterile cast padding volar wrist splint was placed.  Patient was then awoken from anesthesia and taken to the PACU in stable condition.  Post Op Plan/Instructions: The patient will be nonweightbearing to the right lower extremity.  She will be weightbearing as tolerated through the elbow of her right upper extremity.  She will receive postoperative Ancef.  She will receive Lovenox for DVT prophylaxis.  She will mobilize with physical and occupational therapy.  I will request that her methotrexate and DMARDs be held for at least 2 weeks postoperatively for wound healing.  I was present and  performed the entire surgery.  Katha Hamming, MD [facility]

## 2018-05-15 NOTE — Anesthesia Postprocedure Evaluation (Signed)
Anesthesia Post Note  Patient: Cindy Holmes  Procedure(s) Performed: OPEN REDUCTION INTERNAL FIXATION (ORIF) TIBIAL PLATEAU (Right ) OPEN REDUCTION INTERNAL FIXATION (ORIF) ANKLE FRACTURE (Right ) OPEN REDUCTION INTERNAL FIXATION (ORIF) DISTAL RADIAL FRACTURE (Right Wrist)     Patient location during evaluation: PACU Anesthesia Type: General Level of consciousness: awake Pain management: pain level controlled Vital Signs Assessment: post-procedure vital signs reviewed and stable Respiratory status: spontaneous breathing Cardiovascular status: stable Postop Assessment: no apparent nausea or vomiting Anesthetic complications: no    Last Vitals:  Vitals:   05/15/18 2054 05/15/18 2100  BP:  (!) 125/44  Pulse: (!) 103 99  Resp: 13 13  Temp:    SpO2: 97% 97%    Last Pain:  Vitals:   05/15/18 1200  TempSrc:   PainSc: 3                  Felix Pratt

## 2018-05-16 ENCOUNTER — Encounter (HOSPITAL_COMMUNITY): Payer: Self-pay | Admitting: Student

## 2018-05-16 LAB — CBC WITH DIFFERENTIAL/PLATELET
Abs Immature Granulocytes: 0.06 10*3/uL (ref 0.00–0.07)
Basophils Absolute: 0 10*3/uL (ref 0.0–0.1)
Basophils Relative: 0 %
EOS ABS: 0 10*3/uL (ref 0.0–0.5)
Eosinophils Relative: 0 %
HCT: 29 % — ABNORMAL LOW (ref 36.0–46.0)
Hemoglobin: 9.1 g/dL — ABNORMAL LOW (ref 12.0–15.0)
Immature Granulocytes: 1 %
LYMPHS ABS: 0.4 10*3/uL — AB (ref 0.7–4.0)
LYMPHS PCT: 4 %
MCH: 29.9 pg (ref 26.0–34.0)
MCHC: 31.4 g/dL (ref 30.0–36.0)
MCV: 95.4 fL (ref 80.0–100.0)
MONOS PCT: 4 %
Monocytes Absolute: 0.5 10*3/uL (ref 0.1–1.0)
NEUTROS ABS: 9.9 10*3/uL — AB (ref 1.7–7.7)
NEUTROS PCT: 91 %
NRBC: 0 % (ref 0.0–0.2)
PLATELETS: 224 10*3/uL (ref 150–400)
RBC: 3.04 MIL/uL — ABNORMAL LOW (ref 3.87–5.11)
RDW: 14.5 % (ref 11.5–15.5)
WBC: 10.9 10*3/uL — ABNORMAL HIGH (ref 4.0–10.5)

## 2018-05-16 LAB — BASIC METABOLIC PANEL
Anion gap: 7 (ref 5–15)
BUN: 9 mg/dL (ref 8–23)
CHLORIDE: 99 mmol/L (ref 98–111)
CO2: 26 mmol/L (ref 22–32)
CREATININE: 0.56 mg/dL (ref 0.44–1.00)
Calcium: 8.3 mg/dL — ABNORMAL LOW (ref 8.9–10.3)
GFR calc Af Amer: >60 mL/min (ref >60)
Glucose, Bld: 161 mg/dL — ABNORMAL HIGH (ref 70–99)
Potassium: 4.4 mmol/L (ref 3.5–5.1)
SODIUM: 132 mmol/L — AB (ref 135–145)

## 2018-05-16 MED ORDER — ENOXAPARIN SODIUM 40 MG/0.4ML ~~LOC~~ SOLN
40.0000 mg | SUBCUTANEOUS | Status: DC
  Administered 2018-05-16 – 2018-05-22 (×7): 40 mg via SUBCUTANEOUS
  Filled 2018-05-16 (×7): qty 0.4

## 2018-05-16 MED ORDER — ASPIRIN 81 MG PO CHEW
81.0000 mg | CHEWABLE_TABLET | Freq: Every day | ORAL | Status: DC
  Administered 2018-05-17 – 2018-05-22 (×6): 81 mg via ORAL
  Filled 2018-05-16 (×6): qty 1

## 2018-05-16 MED ORDER — CLOPIDOGREL BISULFATE 75 MG PO TABS
75.0000 mg | ORAL_TABLET | Freq: Every day | ORAL | Status: DC
  Administered 2018-05-17 – 2018-05-22 (×6): 75 mg via ORAL
  Filled 2018-05-16 (×6): qty 1

## 2018-05-16 NOTE — Plan of Care (Signed)
  Problem: Education: Goal: Knowledge of General Education information will improve Description: Including pain rating scale, medication(s)/side effects and non-pharmacologic comfort measures Outcome: Progressing   Problem: Health Behavior/Discharge Planning: Goal: Ability to manage health-related needs will improve Outcome: Progressing   Problem: Clinical Measurements: Goal: Ability to maintain clinical measurements within normal limits will improve Outcome: Progressing Goal: Will remain free from infection Outcome: Progressing Goal: Diagnostic test results will improve Outcome: Progressing Goal: Respiratory complications will improve Outcome: Progressing Goal: Cardiovascular complication will be avoided Outcome: Progressing   Problem: Skin Integrity: Goal: Risk for impaired skin integrity will decrease Outcome: Progressing   Problem: Safety: Goal: Ability to remain free from injury will improve Outcome: Progressing   Problem: Pain Managment: Goal: General experience of comfort will improve Outcome: Progressing   Problem: Elimination: Goal: Will not experience complications related to bowel motility Outcome: Progressing Goal: Will not experience complications related to urinary retention Outcome: Progressing   Problem: Coping: Goal: Level of anxiety will decrease Outcome: Progressing   

## 2018-05-16 NOTE — Progress Notes (Signed)
Orthopaedic Trauma Progress Note  S: Patient still having a lot of pain.  Feels that her right wrist splint is too tight.  He was cut back a little bit by the nursing staff last night.  Leg is hurting but no significant other complaints.   O:  Vitals:   05/16/18 0350 05/16/18 0446  BP:  (!) 157/65  Pulse:  76  Resp: 16 16  Temp:  98.4 F (36.9 C)  SpO2: 96% 97%   General: No acute distress patient is awake and alert. Right upper extremity: Splint cut down around the thumb to provide a little bit more space.  Sensation and motor function is intact to median, radial and ulnar nerve distribution. Right lower extremity: Knee immobilizer in place as well as the right lower extremity short leg splint.  Wiggles toes and endorses sensation of the dorsum and plantar aspect of her foot.  Brisk cap refill less than 2 seconds.  Imaging: Stable postop imaging  Labs:  Results for orders placed or performed during the hospital encounter of 05/13/18 (from the past 24 hour(s))  CBC with Differential/Platelet     Status: Abnormal   Collection Time: 05/16/18  3:44 AM  Result Value Ref Range   WBC 10.9 (H) 4.0 - 10.5 K/uL   RBC 3.04 (L) 3.87 - 5.11 MIL/uL   Hemoglobin 9.1 (L) 12.0 - 15.0 g/dL   HCT 29.0 (L) 36.0 - 46.0 %   MCV 95.4 80.0 - 100.0 fL   MCH 29.9 26.0 - 34.0 pg   MCHC 31.4 30.0 - 36.0 g/dL   RDW 14.5 11.5 - 15.5 %   Platelets 224 150 - 400 K/uL   nRBC 0.0 0.0 - 0.2 %   Neutrophils Relative % 91 %   Neutro Abs 9.9 (H) 1.7 - 7.7 K/uL   Lymphocytes Relative 4 %   Lymphs Abs 0.4 (L) 0.7 - 4.0 K/uL   Monocytes Relative 4 %   Monocytes Absolute 0.5 0.1 - 1.0 K/uL   Eosinophils Relative 0 %   Eosinophils Absolute 0.0 0.0 - 0.5 K/uL   Basophils Relative 0 %   Basophils Absolute 0.0 0.0 - 0.1 K/uL   Immature Granulocytes 1 %   Abs Immature Granulocytes 0.06 0.00 - 0.07 K/uL  Basic metabolic panel     Status: Abnormal   Collection Time: 05/16/18  3:44 AM  Result Value Ref Range   Sodium 132 (L) 135 - 145 mmol/L   Potassium 4.4 3.5 - 5.1 mmol/L   Chloride 99 98 - 111 mmol/L   CO2 26 22 - 32 mmol/L   Glucose, Bld 161 (H) 70 - 99 mg/dL   BUN 9 8 - 23 mg/dL   Creatinine, Ser 0.56 0.44 - 1.00 mg/dL   Calcium 8.3 (L) 8.9 - 10.3 mg/dL   GFR calc non Af Amer >60 >60 mL/min   GFR calc Af Amer >60 >60 mL/min   Anion gap 7 5 - 15    Assessment: 67 year old female s/p fall down stairs  Injuries: 1. Right lateral tibial plateau fracture s/p ORIF 2. Right trimalleolar ankle fracture s/p ORIF 3. Right distal radius fracture s/p ORIF   Weightbearing: NWB RLE, WBAT RUE thru elbow  Insicional and dressing care: Keep splints clean dry and intact  Orthopedic device(s):Ordered hinged knee brace for right lower extremity  CV/Blood loss:Hgb this AM 9.1. Hemodynamically stable  Pain management: 1. Dilaudid PCA per primary team  Likely would recommend continuing PCA today and transition to  oral meds tomorrow.  VTE prophylaxis: Start lovenox 40 mg today  ID: Ancef 2gm postoperatively  Foley/Lines: Medlock IVF  Medical co-morbidities: 1. RA/Lupus-Stopped DMARD and methotrexate, will recommend restarting about 2 weeks postoperatively. Continue prednisone   Impediments to Fracture Healing: 1. Long term corticosteroid use and RA/Lupus history  Dispo: PT/OT eval, likely SNF  Follow - up plan: 2 weeks  Shona Needles, MD [facility] [phone] (phone)

## 2018-05-16 NOTE — Evaluation (Signed)
Occupational Therapy Evaluation Patient Details Name: Cindy Holmes MRN: [id number] DOB: 10-23-1950 Today's Date: 05/16/2018    History of Present Illness Admitted after fall down stairs resulting in R tibial plateau fx, R ankle fx, and R distal Radius fx; s/p ORIFs, NWB RLE, and can bear weight through R elbow only;  has a pertinent past medical history of CAD (coronary artery disease),  Carotid artery disease (HCC), Chronic diastolic CHF (congestive heart failure) ([place]),  Neuromuscular disorder ([place]), Rheumatoid arthritis ([place]), Right rotator cuff tear (11/16/2013), and Stroke ([place]).   Clinical Impression   PTA, pt was living with her husband and was independent. Pt currently requiring Mod A for UB ADLs, Max A +2 for LB ADLs, and Max A +3 for functional transfers. Pt presenting with decreased strength, balance, and activity tolerance. Pt motivated to participate in therapy. Pt would benefit from further acute OT to facilitate safe dc. Recommend dc to SNF for further OT to optimize safety, independence with ADLs, and return to PLOF.      Follow Up Recommendations  SNF;Supervision/Assistance - 24 hour    Equipment Recommendations  Other (comment)(Defer to next venue)    Recommendations for Other Services PT consult     Precautions / Restrictions Precautions Precautions: Fall Restrictions Weight Bearing Restrictions: Yes RUE Weight Bearing: Weight bear through elbow only RLE Weight Bearing: Non weight bearing      Mobility Bed Mobility Overal bed mobility: Needs Assistance Bed Mobility: Sit to Supine       Sit to supine: Total assist;+2 for physical assistance   General bed mobility comments: Total A manage BLEs and bring over EOB as well as faciltiate trunk. Bed pad used to position hips  Transfers Overall transfer level: Needs assistance Equipment used: 2 person hand held assist Transfers: Sit to/from [facility] Sit to Stand: Max assist;+2 physical  assistance;+2 safety/equipment Stand pivot transfers: Max assist;+2 physical assistance;+2 safety/equipment       General transfer comment: Max A +3 to power up into standing and pivot towards left. Requiring third person to manage RLE and maintain WB status. Blocking of left knee throughout    Balance Overall balance assessment: Needs assistance Sitting-balance support: No upper extremity supported;Feet supported Sitting balance-Leahy Scale: Fair     Standing balance support: Bilateral upper extremity supported;During functional activity Standing balance-Leahy Scale: Poor                             ADL either performed or assessed with clinical judgement   ADL Overall ADL's : Needs assistance/impaired Eating/Feeding: Minimal assistance;Sitting   Grooming: Minimal assistance;Sitting   Upper Body Bathing: Moderate assistance;Sitting   Lower Body Bathing: Maximal assistance;+2 for physical assistance;Sitting/lateral leans;Sit to/from stand   Upper Body Dressing : Moderate assistance;Sitting   Lower Body Dressing: Maximal assistance;+2 for physical assistance;+2 for safety/equipment;Sitting/lateral leans;Sit to/from stand Lower Body Dressing Details (indicate cue type and reason): +3 for sit<>stand Toilet Transfer: Maximal assistance;+2 for physical assistance;+2 for safety/equipment;Stand-pivot(+3) Toilet Transfer Details (indicate cue type and reason): stand pivot to EOB requiring Max A +3. Two person for power up into standing with thrid person managing RLE and maintaining WBing status. Left knees blocked throughout         Functional mobility during ADLs: Maximal assistance;+2 for physical assistance;+2 for safety/equipment(+3 stand pivot only) General ADL Comments: Pt presenting with decreased strnegth, balance, and activity tolerance. Very pleansant and agreeable to therapy     Vision  Perception     Praxis      Pertinent Vitals/Pain Pain  Assessment: Faces Faces Pain Scale: Hurts even more Pain Location: RLE Pain Descriptors / Indicators: Constant;Discomfort;Grimacing Pain Intervention(s): Limited activity within patient's tolerance;Monitored during session;Premedicated before session;Repositioned;PCA encouraged     Hand Dominance Right   Extremity/Trunk Assessment Upper Extremity Assessment Upper Extremity Assessment: RUE deficits/detail RUE Deficits / Details:  R distal Radius fx; s/p ORIFs. Unable to perform finger opposition due to edema, pain, and immobilization. WFL elbow and shoulder ROM RUE: Unable to fully assess due to immobilization RUE Coordination: decreased fine motor;decreased gross motor   Lower Extremity Assessment Lower Extremity Assessment: Defer to PT evaluation   Cervical / Trunk Assessment Cervical / Trunk Assessment: Other exceptions Cervical / Trunk Exceptions: Increased body habitus   Communication Communication Communication: No difficulties;Other (comment)(Slight slurred speech)   Cognition Arousal/Alertness: Lethargic Behavior During Therapy: WFL for tasks assessed/performed Overall Cognitive Status: Impaired/Different from baseline Area of Impairment: Problem solving;Safety/judgement;Following commands                       Following Commands: Follows one step commands inconsistently;Follows one step commands with increased time Safety/Judgement: Decreased awareness of safety   Problem Solving: Slow processing General Comments: Requiring increased time and cues   General Comments  Husband present throughout    Exercises Exercises: General Upper Extremity General Exercises - Upper Extremity Shoulder Flexion: AROM;Right;10 reps;Seated Shoulder Extension: AROM;Right;10 reps;Seated Elbow Flexion: AROM;Right;10 reps;Seated Elbow Extension: AROM;Right;10 reps;Seated Digit Composite Flexion: AROM;10 reps;Seated;Right Composite Extension: AROM;10 reps;Seated;Right    Shoulder Instructions      Home Living Family/patient expects to be discharged to:: Private residence Living Arrangements: Spouse/significant other Available Help at Discharge: Family Type of Home: House Home Access: Stairs to enter(side door) Technical brewer of Steps: 1   Home Layout: One level     Bathroom Shower/Tub: Tub/shower unit;Walk-in shower   Bathroom Toilet: Handicapped height     Home Equipment: Youth worker - 2 wheels;Cane - single point   Additional Comments: Haven't needed to Scooter in a while      Prior Functioning/Environment Level of Independence: Independent                 OT Problem List: Decreased strength;Decreased range of motion;Decreased activity tolerance;Impaired balance (sitting and/or standing);Decreased safety awareness;Decreased knowledge of use of DME or AE;Decreased knowledge of precautions;Pain;Impaired UE functional use      OT Treatment/Interventions: Self-care/ADL training;Therapeutic exercise;Energy conservation;DME and/or AE instruction;Therapeutic activities;Patient/family education    OT Goals(Current goals can be found in the care plan section) Acute Rehab OT Goals Patient Stated Goal: "Walk again" OT Goal Formulation: With patient/family Time For Goal Achievement: 05/30/18 Potential to Achieve Goals: Good  OT Frequency: Min 2X/week   Barriers to D/C:            Co-evaluation PT/OT/SLP Co-Evaluation/Treatment: Yes Reason for Co-Treatment: For patient/therapist safety;To address functional/ADL transfers;Complexity of the patient's impairments (multi-system involvement)   OT goals addressed during session: ADL's and self-care      AM-PAC PT "6 Clicks" Daily Activity     Outcome Measure Help from another person eating meals?: A Little Help from another person taking care of personal grooming?: A Little Help from another person toileting, which includes using toliet, bedpan, or urinal?: A  Lot Help from another person bathing (including washing, rinsing, drying)?: A Lot Help from another person to put on and taking off regular upper body clothing?: A Lot Help from another person  to put on and taking off regular lower body clothing?: A Lot 6 Click Score: 14   End of Session Nurse Communication: Mobility status  Activity Tolerance: Patient limited by fatigue;Patient limited by pain Patient left: in bed;with bed alarm set;with family/visitor present;with call bell/phone within reach  OT Visit Diagnosis: Unsteadiness on feet (R26.81);Other abnormalities of gait and mobility (R26.89);Muscle weakness (generalized) (M62.81);Pain;History of falling (Z91.81) Pain - Right/Left: Right Pain - part of body: Leg;Arm;Hand                Time: 1791-5056 OT Time Calculation (min): 26 min Charges:  OT General Charges $OT Visit: 1 Visit OT Evaluation $OT Eval Moderate Complexity: [place], OTR/L Acute Rehab Pager: [phone] Office: [place] 05/16/2018, 4:45 PM

## 2018-05-16 NOTE — Plan of Care (Signed)
  Problem: Education: Goal: Knowledge of General Education information will improve Description Including pain rating scale, medication(s)/side effects and non-pharmacologic comfort measures Outcome: Progressing Note:  POC, pain management and orders reviewed with pt./family.

## 2018-05-16 NOTE — Plan of Care (Signed)

## 2018-05-16 NOTE — Progress Notes (Signed)
PROGRESS NOTE  Cindy Holmes ELF:[id number] DOB: Oct 15, 1950 DOA: 05/13/2018 PCP: Redmond School, MD  Brief Narrative: 67 year old woman who fell down the stairs and sustained right distal radius fracture, right tibial plateau fracture, distal tibia/fibula fracture.  Evaluated by trauma service with recommendation to medicine and orthopedic consultation.  Assessment/Plan Right distal radius fracture, right leg fractures. --Status post operative fixation 11/4.  Management per orthopedics.  Orthopedics has recommended continuing Dilaudid PCA today.  Right thumb pain --Patient reports orthopedics loosened cast this morning.  I discussed with RN who will contact orthopedics for further evaluation.  Chronic diastolic CHF, coronary artery disease status post stent placement --Continue aspirin, Plavix, metoprolol, ramipril  Rheumatoid arthritis --Orthopedics recommends holding DMARD and methotrexate for 2 weeks.  Continue prednisone.  DVT prophylaxis: Enoxaparin Code Status: Full Family Communication: husband at bedsid3 Disposition Plan: home    [name], MD  [facility] Direct contact: [phone] --[place] [place]  --[email or web address]; password [email or web address]  7PM-7AM contact night coverage as above 05/16/2018, 3:49 PM  LOS: 2 days   Consultants:  Trauma service  Orthopedics   Procedures: 1. CPT 35361-WERX reduction internal fixation of right lateral tibial plateau fracture 2. CPT 54008-QPYP reduction internal fixation of right trimalleolar ankle fracture without posterior malleolus fixation 3. CPT 27829-Open reduction internal fixation of right syndesmosis 4. CPT 25608-Open reduction internal fixation of right intra-articular distal radius fracture  Antimicrobials:    Interval history/Subjective: Feels better today overall still has pain.  Right thumb painful secondary to tight cast.  Reports some numbness.  Reports cast was loosened earlier today by  orthopedics.  Objective: Vitals:  Vitals:   05/16/18 0920 05/16/18 1223  BP: (!) 147/83   Pulse: 83   Resp: (!) 21 18  Temp: 98.1 F (36.7 C)   SpO2: 96% 97%    Exam:  Constitutional:  . Appears calm and comfortable Respiratory:  . CTA bilaterally, no w/r/r.  . Respiratory effort normal.  Cardiovascular:  . RRR, no m/r/g Musculoskeletal:  . Right thumb nontender, some discoloration could be ecchymosis Psychiatric:  . Mental status o Mood, affect appropriate  I have personally reviewed the following:   Data: . Sodium 132, remainder BMP unremarkable. . Hemoglobin trending down, 9.1.  Scheduled Meds: . allopurinol  400 mg Oral Daily  . [START ON 05/17/2018] aspirin  81 mg Oral Daily  . atorvastatin  80 mg Oral q1800  . [START ON 05/17/2018] clopidogrel  75 mg Oral Daily  . enoxaparin (LOVENOX) injection  40 mg Subcutaneous Q24H  . folic acid  1 mg Oral Daily  . furosemide  40 mg Oral Daily  . gabapentin  300 mg Oral TID  . HYDROmorphone   Intravenous Q4H  . loratadine  10 mg Oral Daily  . metoprolol tartrate  2.5 mg Intravenous Q12H  . pantoprazole  80 mg Oral Q1200  . predniSONE  20 mg Oral Q breakfast  . ramipril  2.5 mg Oral Daily   Continuous Infusions: . sodium chloride 10 mL/hr at 05/14/18 0800  . lactated ringers 10 mL/hr at 05/15/18 2352    Principal Problem:   Multiple fractures Active Problems:   Essential hypertension   Rheumatoid arthritis ([place])   Coronary artery disease involving native coronary artery of native heart without angina pectoris   Chronic diastolic CHF (congestive heart failure) (HCC)   Fracture of distal end of right radius   Tibial plateau fracture, right, closed, initial encounter   LOS: 2 days

## 2018-05-16 NOTE — Addendum Note (Signed)
Addendum  created 05/16/18 1555 by Effie Berkshire, MD   Intraprocedure Staff edited

## 2018-05-16 NOTE — Evaluation (Signed)
Physical Therapy Evaluation Patient Details Name: Cindy Holmes MRN: [id number] DOB: August 09, 1950 Today's Date: 05/16/2018   History of Present Illness  Admitted after fall down stairs resulting in R tibial plateau fx, R ankle fx, and R distal Radius fx; s/p ORIFs, NWB RLE, and can bear weight through R elbow only;  has a pertinent past medical history of CAD (coronary artery disease),  Carotid artery disease (HCC), Chronic diastolic CHF (congestive heart failure) ([place]),  Neuromuscular disorder ([place]), Rheumatoid arthritis ([place]), Right rotator cuff tear (11/16/2013), and Stroke ([place]).  Clinical Impression   Patient is s/p above surgery resulting in functional limitations due to the deficits listed below (see PT Problem List). Walking prior to admission; Presents with decr functional mobility and decr activity tolerance; Requires 3 person assist currently for safety with transfers;  Patient will benefit from skilled PT to increase their independence and safety with mobility to allow discharge to the venue listed below.       Follow Up Recommendations SNF    Equipment Recommendations  Other (comment);Wheelchair (measurements PT)(r platform walker when approp)    Recommendations for Other Services       Precautions / Restrictions Precautions Precautions: Fall Restrictions Weight Bearing Restrictions: Yes RUE Weight Bearing: Weight bear through elbow only RLE Weight Bearing: Non weight bearing      Mobility  Bed Mobility Overal bed mobility: Needs Assistance Bed Mobility: Sit to Supine       Sit to supine: Total assist;+2 for physical assistance   General bed mobility comments: Total A manage BLEs and bring over EOB as well as faciltiate trunk. Bed pad used to position hips  Transfers Overall transfer level: Needs assistance Equipment used: 2 person hand held assist(third person to manage NWB RLE) Transfers: Sit to/from [facility] Sit to Stand: Max assist;+2  physical assistance;+2 safety/equipment Stand pivot transfers: Max assist;+2 physical assistance;+2 safety/equipment       General transfer comment: Max A +3 to power up into standing and pivot towards left. Requiring third person to manage RLE and maintain WB status. Blocking of left knee throughout  Ambulation/Gait                Stairs            Wheelchair Mobility    Modified Rankin (Stroke Patients Only)       Balance Overall balance assessment: Needs assistance Sitting-balance support: No upper extremity supported;Feet supported Sitting balance-Leahy Scale: Fair     Standing balance support: Bilateral upper extremity supported;During functional activity Standing balance-Leahy Scale: Poor                               Pertinent Vitals/Pain Pain Assessment: Faces Faces Pain Scale: Hurts even more Pain Location: RLE Pain Descriptors / Indicators: Constant;Discomfort;Grimacing Pain Intervention(s): Limited activity within patient's tolerance;Monitored during session    [place] expects to be discharged to:: Private residence Living Arrangements: Spouse/significant other Available Help at Discharge: Family Type of Home: House Home Access: Stairs to enter(side door)   Technical brewer of Steps: 1 Home Layout: [place]: Youth worker - 2 wheels;Cane - single point Additional Comments: Haven't needed to Scooter in a while    Prior Function Level of Independence: Independent               Hand Dominance   Dominant Hand: Right    Extremity/Trunk Assessment   Upper Extremity Assessment Upper  Extremity Assessment: Defer to OT evaluation RUE Deficits / Details:  R distal Radius fx; s/p ORIFs. Unable to perform finger opposition due to edema, pain, and immobilization. WFL elbow and shoulder ROM RUE: Unable to fully assess due to immobilization RUE Coordination: decreased fine  motor;decreased gross motor    Lower Extremity Assessment Lower Extremity Assessment: Generalized weakness;RLE deficits/detail RLE Deficits / Details: Knee and ankle immobilized; generally weak; positive active toe wiggle, and sensation intact to light touch at toes    Cervical / Trunk Assessment Cervical / Trunk Assessment: Other exceptions Cervical / Trunk Exceptions: Increased body habitus  Communication   Communication: No difficulties;Other (comment)(Slight slurred speech)  Cognition Arousal/Alertness: Lethargic Behavior During Therapy: WFL for tasks assessed/performed Overall Cognitive Status: Impaired/Different from baseline Area of Impairment: Problem solving;Safety/judgement;Following commands                       Following Commands: Follows one step commands inconsistently;Follows one step commands with increased time Safety/Judgement: Decreased awareness of safety   Problem Solving: Slow processing General Comments: Requiring increased time and cues      General Comments General comments (skin integrity, edema, etc.): Husband present throughout    Exercises General Exercises - Upper Extremity Shoulder Flexion: AROM;Right;10 reps;Seated Shoulder Extension: AROM;Right;10 reps;Seated Elbow Flexion: AROM;Right;10 reps;Seated Elbow Extension: AROM;Right;10 reps;Seated Digit Composite Flexion: AROM;10 reps;Seated;Right Composite Extension: AROM;10 reps;Seated;Right   Assessment/Plan    PT Assessment Patient needs continued PT services  PT Problem List Decreased strength;Decreased range of motion;Decreased activity tolerance;Decreased balance;Decreased mobility;Decreased coordination;Decreased cognition;Decreased knowledge of use of DME;Decreased safety awareness;Decreased knowledge of precautions;Pain       PT Treatment Interventions DME instruction;Functional mobility training;Therapeutic activities;Therapeutic exercise;Balance training;Neuromuscular  re-education;Cognitive remediation;Patient/family education    PT Goals (Current goals can be found in the Care Plan section)  Acute Rehab PT Goals Patient Stated Goal: "Walk again" PT Goal Formulation: With patient Time For Goal Achievement: 05/30/18 Potential to Achieve Goals: Fair    Frequency Min 2X/week   Barriers to discharge        Co-evaluation PT/OT/SLP Co-Evaluation/Treatment: Yes Reason for Co-Treatment: For patient/therapist safety;To address functional/ADL transfers PT goals addressed during session: Mobility/safety with mobility OT goals addressed during session: ADL's and self-care       AM-PAC PT "6 Clicks" Daily Activity  Outcome Measure Difficulty turning over in bed (including adjusting bedclothes, sheets and blankets)?: Unable Difficulty moving from lying on back to sitting on the side of the bed? : Unable Difficulty sitting down on and standing up from a chair with arms (e.g., wheelchair, bedside commode, etc,.)?: Unable Help needed moving to and from a bed to chair (including a wheelchair)?: Total Help needed walking in hospital room?: Total Help needed climbing 3-5 steps with a railing? : Total 6 Click Score: 6    End of Session Equipment Utilized During Treatment: Gait belt;Right knee immobilizer Activity Tolerance: Patient tolerated treatment well Patient left: in bed;with call bell/phone within reach Nurse Communication: Mobility status PT Visit Diagnosis: Unsteadiness on feet (R26.81);Other abnormalities of gait and mobility (R26.89);Muscle weakness (generalized) (M62.81);History of falling (Z91.81);Pain Pain - Right/Left: Right Pain - part of body: Leg(and arm)    Time: 5643-3295 PT Time Calculation (min) (ACUTE ONLY): 27 min   Charges:   PT Evaluation $PT Eval Moderate Complexity: 1 Mod          Roney Marion, PT  Acute Rehabilitation Services Pager [phone] Office [phone]   [place] 05/16/2018, 5:08 PM

## 2018-05-16 NOTE — Progress Notes (Signed)
Patient c/o dressing to RUE being tight. MD was notified and stated that he will come to look at the dressing later today. Patient able to move all fingers. Cap refill <3sec. Patient encouraged to elevate extremity. Denies pain being worse than normal. Ice applied to extremity. Will cont to monitor.

## 2018-05-17 DIAGNOSIS — W19XXXA Unspecified fall, initial encounter: Secondary | ICD-10-CM

## 2018-05-17 DIAGNOSIS — Y92009 Unspecified place in unspecified non-institutional (private) residence as the place of occurrence of the external cause: Secondary | ICD-10-CM

## 2018-05-17 LAB — CBC WITH DIFFERENTIAL/PLATELET
Abs Immature Granulocytes: 0.04 10*3/uL (ref 0.00–0.07)
BASOS PCT: 0 %
Basophils Absolute: 0 10*3/uL (ref 0.0–0.1)
EOS ABS: 0 10*3/uL (ref 0.0–0.5)
Eosinophils Relative: 0 %
HCT: 24.8 % — ABNORMAL LOW (ref 36.0–46.0)
Hemoglobin: 7.8 g/dL — ABNORMAL LOW (ref 12.0–15.0)
Immature Granulocytes: 1 %
Lymphocytes Relative: 15 %
Lymphs Abs: 1.3 10*3/uL (ref 0.7–4.0)
MCH: 29.3 pg (ref 26.0–34.0)
MCHC: 31.5 g/dL (ref 30.0–36.0)
MCV: 93.2 fL (ref 80.0–100.0)
MONOS PCT: 8 %
Monocytes Absolute: 0.7 10*3/uL (ref 0.1–1.0)
NRBC: 0 % (ref 0.0–0.2)
Neutro Abs: 6.7 10*3/uL (ref 1.7–7.7)
Neutrophils Relative %: 76 %
PLATELETS: 194 10*3/uL (ref 150–400)
RBC: 2.66 MIL/uL — ABNORMAL LOW (ref 3.87–5.11)
RDW: 14.4 % (ref 11.5–15.5)
WBC: 8.8 10*3/uL (ref 4.0–10.5)

## 2018-05-17 LAB — BASIC METABOLIC PANEL
ANION GAP: 8 (ref 5–15)
BUN: 8 mg/dL (ref 8–23)
CALCIUM: 8.2 mg/dL — AB (ref 8.9–10.3)
CO2: 31 mmol/L (ref 22–32)
CREATININE: 0.56 mg/dL (ref 0.44–1.00)
Chloride: 95 mmol/L — ABNORMAL LOW (ref 98–111)
GFR calc Af Amer: >60 mL/min (ref >60)
GLUCOSE: 121 mg/dL — AB (ref 70–99)
Potassium: 3.7 mmol/L (ref 3.5–5.1)
Sodium: 134 mmol/L — ABNORMAL LOW (ref 135–145)

## 2018-05-17 MED ORDER — ALENDRONATE SODIUM 10 MG PO TABS: 70.0000 mg | ORAL_TABLET | ORAL | Status: DC

## 2018-05-17 MED ORDER — POTASSIUM CHLORIDE CRYS ER 20 MEQ PO TBCR
40.0000 meq | EXTENDED_RELEASE_TABLET | Freq: Once | ORAL | Status: AC
  Administered 2018-05-17: 40 meq via ORAL
  Filled 2018-05-17: qty 2

## 2018-05-17 NOTE — Care Management Note (Signed)
Case Management Note  Patient Details  Name: Cindy Holmes MRN: [id number] Date of Birth: 11-18-1950  Subjective/Objective:                Multiple fractures    Action/Plan:  CSW following for SNF placement.  Expected Discharge Date:                  Expected Discharge Plan:  Skilled Nursing Facility  In-House Referral:  Clinical Social Work  Discharge planning Services  CM Consult  Post Acute Care Choice:    Choice offered to:     DME Arranged:    DME Agency:     HH Arranged:    [place] Agency:     Status of Service:  Completed, signed off  If discussed at [employer], dates discussed:    Additional Comments:  Carles Collet, RN 05/17/2018, 11:34 AM

## 2018-05-17 NOTE — Care Management Important Message (Signed)
Important Message  Patient Details  Name: Cindy Holmes MRN: [id number] Date of Birth: October 17, 1950   Medicare Important Message Given:  Yes    Liadan Guizar [address] 05/17/2018, 2:45 PM

## 2018-05-17 NOTE — Clinical Social Work Note (Signed)
Clinical Social Work Assessment  Patient Details  Name: Cindy Holmes MRN: [id number] Date of Birth: 24-Jun-1951  Date of referral:  05/17/18               Reason for consult:  Discharge Planning                Permission sought to share information with:  Case Manager, Facility Sport and exercise psychologist, Family Supports Permission granted to share information::  Yes, Verbal Permission Granted  Name::     Engineer, maintenance::  SNFs  Relationship::  spouse  Contact Information:  [phone]  Housing/Transportation Living arrangements for the past 2 months:  [place] of Information:  Patient Patient Interpreter Needed:  None Criminal Activity/Legal Involvement Pertinent to Current Situation/Hospitalization:  No - Comment as needed Significant Relationships:  Spouse Lives with:  Spouse Do you feel safe going back to the place where you live?  No Need for family participation in patient care:  No (Coment)  Care giving concerns:  CSW received referral for possible SNF placement at time of discharge. Spoke with patient regarding possibility of SNF placement . Patient's  husband  is currently unable to care for her at their home given patient's current needs and fall risk.  Patient and husband expressed understanding of PT recommendation and are agreeable to SNF placement at time of discharge. CSW to continue to follow and assist with discharge planning needs.     Social Worker assessment / plan:  Spoke with patient and husband  concerning possibility of rehab at [facility] before returning home.    Employment status:  Retired Forensic scientist:  Medicare PT Recommendations:  [place] / Referral to community resources:  [place]  Patient/Family's Response to care:  Patient and her husband Herbie [place]  recognize need for rehab before returning home and are agreeable to a SNF and report preference for [employer]. CSW explained insurance  authorization process. Patient's family reported that they want patient to get stronger to be able to come back home.    Patient/Family's Understanding of and Emotional Response to Diagnosis, Current Treatment, and Prognosis:  Patient/family is realistic regarding therapy needs and expressed being hopeful for SNF placement. Patient expressed understanding of CSW role and discharge process as well as medical condition. No questions/concerns about plan or treatment.    Emotional Assessment Appearance:  Appears stated age Attitude/Demeanor/Rapport:  Engaged, Gracious Affect (typically observed):  Accepting Orientation:  Oriented to Self, Oriented to Situation, Oriented to Place, Oriented to  Time Alcohol / Substance use:  Not Applicable Psych involvement (Current and /or in the community):  No (Comment)  Discharge Needs  Concerns to be addressed:  Discharge Planning Concerns Readmission within the last 30 days:  No Current discharge risk:  Dependent with Mobility Barriers to Discharge:  Continued Medical Work up   [employer], LCSW 05/17/2018, 3:08 PM

## 2018-05-17 NOTE — Progress Notes (Signed)
Orthopaedic Trauma Progress Note  S: Doing better, pain better controlled. Still feels like she needs PCA  O:  Vitals:   05/17/18 1143 05/17/18 1148  BP:  (!) 145/67  Pulse:  82  Resp:  20  Temp: 97.9 F (36.6 C)   SpO2:  99%   General: No acute distress patient is awake and alert. Right upper extremity: Splint taken down, incisions clean, dry and intact.  Sensation and motor function is intact to median, radial and ulnar nerve distribution. Right lower extremity: Knee immobilizer in place as well as the right lower extremity short leg splint.  Wiggles toes and endorses sensation of the dorsum and plantar aspect of her foot.  Brisk cap refill less than 2 seconds.  Imaging: Stable postop imaging  Labs:  Results for orders placed or performed during the hospital encounter of 05/13/18 (from the past 24 hour(s))  CBC with Differential/Platelet     Status: Abnormal   Collection Time: 05/17/18  3:04 AM  Result Value Ref Range   WBC 8.8 4.0 - 10.5 K/uL   RBC 2.66 (L) 3.87 - 5.11 MIL/uL   Hemoglobin 7.8 (L) 12.0 - 15.0 g/dL   HCT 24.8 (L) 36.0 - 46.0 %   MCV 93.2 80.0 - 100.0 fL   MCH 29.3 26.0 - 34.0 pg   MCHC 31.5 30.0 - 36.0 g/dL   RDW 14.4 11.5 - 15.5 %   Platelets 194 150 - 400 K/uL   nRBC 0.0 0.0 - 0.2 %   Neutrophils Relative % 76 %   Neutro Abs 6.7 1.7 - 7.7 K/uL   Lymphocytes Relative 15 %   Lymphs Abs 1.3 0.7 - 4.0 K/uL   Monocytes Relative 8 %   Monocytes Absolute 0.7 0.1 - 1.0 K/uL   Eosinophils Relative 0 %   Eosinophils Absolute 0.0 0.0 - 0.5 K/uL   Basophils Relative 0 %   Basophils Absolute 0.0 0.0 - 0.1 K/uL   Immature Granulocytes 1 %   Abs Immature Granulocytes 0.04 0.00 - 0.07 K/uL  Basic metabolic panel     Status: Abnormal   Collection Time: 05/17/18  3:04 AM  Result Value Ref Range   Sodium 134 (L) 135 - 145 mmol/L   Potassium 3.7 3.5 - 5.1 mmol/L   Chloride 95 (L) 98 - 111 mmol/L   CO2 31 22 - 32 mmol/L   Glucose, Bld 121 (H) 70 - 99 mg/dL   BUN  8 8 - 23 mg/dL   Creatinine, Ser 0.56 0.44 - 1.00 mg/dL   Calcium 8.2 (L) 8.9 - 10.3 mg/dL   GFR calc non Af Amer >60 >60 mL/min   GFR calc Af Amer >60 >60 mL/min   Anion gap 8 5 - 15    Assessment: 67 year old female s/p fall down stairs  Injuries: 1. Right lateral tibial plateau fracture s/p ORIF 2. Right trimalleolar ankle fracture s/p ORIF 3. Right distal radius fracture s/p ORIF   Weightbearing: NWB RLE, WBAT RUE thru elbow  Insicional and dressing care: Keep splints clean dry and intact  Orthopedic device(s):Right wrist brace  CV/Blood loss:Hgb this AM 9.1. Hemodynamically stable  Pain management: 1. Dilaudid PCA per primary team  Will discontinue PCA tomorrow AM and transition to oral meds  VTE prophylaxis: Lovenox 40 mg today  ID: Ancef 2gm postoperatively-Completed  Foley/Lines: Medlock IVF  Medical co-morbidities: 1. RA/Lupus-Stopped DMARD and methotrexate, will recommend restarting about 2 weeks postoperatively. Continue prednisone   Impediments to Fracture Healing: 1.  Long term corticosteroid use and RA/Lupus history  Dispo: PT/OT, SNF  Follow - up plan: 2 weeks  Shona Needles, MD [facility] [phone] (phone)

## 2018-05-17 NOTE — Progress Notes (Signed)
Patient Demographics:    Cindy Holmes, is a 67 y.o. female, DOB - Apr 03, 1951, NFA:[id number]  Admit date - 05/13/2018   Admitting Physician Etta Quill, DO  Outpatient Primary MD for the patient is Redmond School, MD  LOS - 3   Chief Complaint  Patient presents with  . Fall        Subjective:    Cindy Holmes today has no fevers, no emesis,  No chest pain, husband at bedside, questions answered, and leg pain not well controlled  Assessment  & Plan :    Principal Problem:   Multiple fractures Active Problems:   Essential hypertension   Rheumatoid arthritis ([place])   Coronary artery disease involving native coronary artery of native heart without angina pectoris   Chronic diastolic CHF (congestive heart failure) (HCC)   Fracture of distal end of right radius   Tibial plateau fracture, right, closed, initial encounter   Fall at home, initial encounter  Brief Summary  67 y.o. female with medical history significant of RA, CAD s/p stents, chronic diastolic CHF, HTN, TIA admitted on 05/14/2018 after sustaining a fall (transferred from [facility] to [place]), imaging studies revealed  R distal radius fx, R tibial plateu fractures, R ankle Trimalleolar fractures   Plan:- 1)S/p Fall with multiple injuries injuries:  A). Right lateral tibial plateau fracture s/p ORIF on 05/15/18 B). Right trimalleolar ankle fracture s/p ORIF on 05/15/18 C) Right distal radius fracture s/p ORIF on 05/15/18                   Weightbearing: NWB RLE, WBAT RUE thru elbow                  Insicional and dressing care: Keep splints clean dry and intact                  Orthopedic device(s):Right wrist brace  Opiates and pain management per orthopedic team they are recommending PCA pump  2) acute on chronic anemia--at baseline patient has normocytic normochromic anemia of chronic disease in the setting  of chronic rheumatoid arthritis, now appears to have acute blood loss anemia secondary to fractures and operative repair, baseline hemoglobin around 11, hemoglobin admission was 11.2, hemoglobin now is down to 7.8, check stool for occult blood, currently tolerating dropping H&H well, consider transfusion of packed cells especially given the patient has CAD which we will try and keep hemoglobin close to 9  3)HFpEF--- patient with history of chronic CHF, no acute exacerbation at this time, continue cardiac meds  as below #5  4)RA----orthopedic team is requesting a we will continue to hold DMARD and methotrexate through 06/01/2018, continue prednisone  5)h/o CAD--- status post prior coronary stent, stable, no ACS symptoms, continue aspirin, Plavix metoprolol and ramipril  6)Presumed osteoporosis----patient with chronic steroid use now status post fall with multiple fractures, pharmacy consult regarding biphosphonate counseling   Disposition/Need for in-Hospital Stay- patient unable to be discharged at this time due to awaiting insurance approval for transfer to skilled nursing facility for rehab  Code Status : Full  Disposition Plan  : SNF  Consults  :  ortho  DVT Prophylaxis  :  Lovenox -  Lab Results  Component Value  Date   PLT 194 05/17/2018    Inpatient Medications  Scheduled Meds: . alendronate  70 mg Oral Weekly  . allopurinol  400 mg Oral Daily  . aspirin  81 mg Oral Daily  . atorvastatin  80 mg Oral q1800  . clopidogrel  75 mg Oral Daily  . enoxaparin (LOVENOX) injection  40 mg Subcutaneous Q24H  . folic acid  1 mg Oral Daily  . furosemide  40 mg Oral Daily  . gabapentin  300 mg Oral TID  . HYDROmorphone   Intravenous Q4H  . loratadine  10 mg Oral Daily  . metoprolol tartrate  2.5 mg Intravenous Q12H  . pantoprazole  80 mg Oral Q1200  . predniSONE  20 mg Oral Q breakfast  . ramipril  2.5 mg Oral Daily   Continuous Infusions: . sodium chloride 10 mL/hr at 05/14/18  0800  . lactated ringers 10 mL/hr at 05/15/18 2352   PRN Meds:.sodium chloride, acetaminophen **OR** acetaminophen, alum & mag hydroxide-simeth, diphenhydrAMINE **OR** diphenhydrAMINE, naloxone **AND** sodium chloride flush, ondansetron **OR** ondansetron (ZOFRAN) IV, ondansetron (ZOFRAN) IV, senna-docusate    Anti-infectives (From admission, onward)   Start     Dose/Rate Route Frequency Ordered Stop   05/15/18 2330  ceFAZolin (ANCEF) IVPB 2g/100 mL premix     2 g 200 mL/hr over 30 Minutes Intravenous Every 8 hours 05/15/18 2210 05/16/18 1427   05/15/18 1915  vancomycin (VANCOCIN) powder  Status:  Discontinued       As needed 05/15/18 1915 05/15/18 1924   05/15/18 1545  ceFAZolin (ANCEF) IVPB 2g/100 mL premix     2 g 200 mL/hr over 30 Minutes Intravenous On call to O.R. 05/15/18 1346 05/15/18 1535   05/15/18 1349  ceFAZolin (ANCEF) 2-4 GM/100ML-% IVPB    Note to Pharmacy:  Henrine Screws   : cabinet override      05/15/18 1349 05/15/18 1535   05/14/18 1000  hydroxychloroquine (PLAQUENIL) tablet 300 mg  Status:  Discontinued     300 mg Oral Daily 05/14/18 0349 05/16/18 1548       Objective:   Vitals:   05/17/18 0758 05/17/18 1101 05/17/18 1143 05/17/18 1148  BP:    (!) 145/67  Pulse:    82  Resp: 18 15  20   Temp:   97.9 F (36.6 C)   TempSrc:   Oral   SpO2: 100% 100%  99%  Weight:      Height:        Wt Readings from Last 3 Encounters:  05/14/18 100.3 kg  02/21/18 93.9 kg  01/11/17 105 kg    Intake/Output Summary (Last 24 hours) at 05/17/2018 1323 Last data filed at 05/17/2018 0500 Gross per 24 hour  Intake 534.38 ml  Output 2600 ml  Net -2065.62 ml   Physical Exam Patient is examined daily including today on 05/17/18 , exams remain the same as of yesterday except that has changed   Gen:- Awake Alert,  In no apparent distress  HEENT:- [place].AT, No sclera icterus Neck-Supple Neck,No JVD,.  Lungs-  CTAB , good air movement CV- S1, S2 normal, regular Abd-  +ve  B.Sounds, Abd Soft, No tenderness,    Extremity/Skin:- No  edema, right upper extremity and right lower extremity in splints Psych-affect is appropriate, oriented x3 Neuro-no new focal deficits, no tremors   Data Review:   Micro Results Recent Results (from the past 240 hour(s))  MRSA PCR Screening     Status: None   Collection Time: 05/14/18  9:50 PM  Result Value Ref Range Status   MRSA by PCR NEGATIVE NEGATIVE Final    Comment:        The GeneXpert MRSA Assay (FDA approved for NASAL specimens only), is one component of a comprehensive MRSA colonization surveillance program. It is not intended to diagnose MRSA infection nor to guide or monitor treatment for MRSA infections. Performed at [facility], [place] [address], [place], [place] [address]     Radiology Reports Dg Chest 2 View  Result Date: 05/14/2018 CLINICAL DATA:  67 year old female with fall. History of CHF. EXAM: CHEST - 2 VIEW COMPARISON:  Chest CT dated 05/14/2018 FINDINGS: There is cardiomegaly with probable mild vascular congestion. No focal consolidation, pleural effusion, or pneumothorax. No acute osseous pathology. IMPRESSION: Cardiomegaly with probable mild vascular congestion. No focal consolidation. Electronically Signed   By: Anner Crete M.D.   On: 05/14/2018 01:10   Dg Pelvis 1-2 Views  Result Date: 05/14/2018 CLINICAL DATA:  Fall EXAM: PELVIS - 1-2 VIEW COMPARISON:  None. FINDINGS: There is no evidence of pelvic fracture or diastasis. No pelvic bone lesions are seen. IMPRESSION: Negative. Electronically Signed   By: Kerby Moors M.D.   On: 05/14/2018 01:10   Dg Forearm Right  Result Date: 05/14/2018 CLINICAL DATA:  Fall.  Right wrist pain. EXAM: RIGHT FOREARM - 2 VIEW COMPARISON:  None. FINDINGS: There is a an acute intra-articular fracture deformity involving the distal radius. Dorsal angulation of the distal fracture fragments noted. No dislocation identified. IMPRESSION: 1. Acute fracture  involves the dorsal aspect of the distal radius. Mild dorsal angulation of the distal fracture fragments. Electronically Signed   By: Kerby Moors M.D.   On: 05/14/2018 01:07   Dg Wrist Complete Left  Result Date: 05/14/2018 CLINICAL DATA:  67 year old female with fall and left wrist pain. EXAM: LEFT WRIST - COMPLETE 3+ VIEW COMPARISON:  None. FINDINGS: There is no acute fracture or dislocation. The bones are osteopenic. No significant arthritic changes. There is soft tissue swelling over the wrist and ulna. No radiopaque foreign object or soft tissue gas. IMPRESSION: No acute fracture or dislocation. Electronically Signed   By: Anner Crete M.D.   On: 05/14/2018 00:13   Dg Wrist Complete Right  Result Date: 05/15/2018 CLINICAL DATA:  Postoperative right wrist EXAM: RIGHT WRIST - COMPLETE 3+ VIEW COMPARISON:  Intraoperative fluoroscopy 05/15/2018 FINDINGS: Postoperative plate and screw fixation of the distal right radial metaphysis. Are where components appear intact and well seated. Near-anatomic alignment of the fracture fragments. Degenerative changes in the radiocarpal and STT joints. Overlying splint material obscures some bone detail. IMPRESSION: Postoperative plate and screw fixation of the distal right radial metaphysis without apparent complication. Electronically Signed   By: Lucienne Capers M.D.   On: 05/15/2018 22:09   Dg Wrist Complete Right  Result Date: 05/15/2018 CLINICAL DATA:  67 year old female with right distal radial fracture. Subsequent encounter. EXAM: DG C-ARM 61-120 MIN; RIGHT WRIST - COMPLETE 3+ VIEW Fluoroscopic time: 49.7 seconds. COMPARISON:  05/14/2018. FINDINGS: Five intraoperative C-arm views submitted for review after surgery. Distal right radial fracture reduced utilizing plate and screws. Much better alignment of fracture fragments with minimal separation/incongruity of articular surface. IMPRESSION: Open reduction and internal fixation distal right radius  fracture. Electronically Signed   By: Genia Del M.D.   On: 05/15/2018 20:07   Dg Wrist Complete Right  Result Date: 05/14/2018 CLINICAL DATA:  Pain after fall EXAM: RIGHT WRIST - COMPLETE 3+ VIEW COMPARISON:  None.  FINDINGS: There is a comminuted mildly displaced fracture through the distal radial metaphysis. IMPRESSION: Comminuted mildly displaced fracture through the distal radial metaphysis. Electronically Signed   By: Dorise Bullion III M.D   On: 05/14/2018 00:12   Dg Knee 2 Views Right  Result Date: 05/14/2018 CLINICAL DATA:  Pain after fall EXAM: RIGHT KNEE - 1-2 VIEW COMPARISON:  None. FINDINGS: A hemarthrosis is identified. There is a depressed lateral tibial plateau fracture which is comminuted. IMPRESSION: Comminuted mildly depressed lateral tibial plateau fracture with resulting hemarthrosis. Electronically Signed   By: Dorise Bullion III M.D   On: 05/14/2018 00:08   Dg Tibia/fibula Right  Result Date: 05/15/2018 CLINICAL DATA:  67 year old female with tibial plateau fracture and right ankle fracture. Subsequent encounter. EXAM: RIGHT TIBIA AND FIBULA - 2 VIEW; DG C-ARM 61-120 MIN Fluoroscopic time: 3 minutes and 8 seconds. COMPARISON:  05/14/2018 CT. FINDINGS: Eleven intraoperative film submitted for review after surgery. Six intraoperative C-arm views of the right knee: Reduction of lateral tibial plateau fracture with sideplate and screws. Articular surface with better alignment although with slight depression. Five intraoperative C-arm views of the right ankle: Reduction of fibular fracture with sideplate and screws. Screws transfix the distal fibula-tibia joint space. Reduction of medial malleolar fracture with screw. Evaluation of posterior tibial fracture limited secondary to overlying plate. IMPRESSION: Open reduction and internal fixation right knee and right ankle fractures as noted above. Electronically Signed   By: Genia Del M.D.   On: 05/15/2018 17:57   Dg Tibia/fibula  Right  Result Date: 05/14/2018 CLINICAL DATA:  Pain after fall EXAM: RIGHT TIBIA AND FIBULA - 2 VIEW COMPARISON:  None. FINDINGS: There is a comminuted depressed fracture through the lateral tibial plateau. There is a comminuted fracture through the distal fibular diaphysis. There is a lucency through the distal medial malleolus with a step-off. These hemarthrosis in the suprapatellar fossa. IMPRESSION: 1. Depressed comminuted lateral tibial plateau fracture. 2. Comminuted fracture through the distal fibular diaphysis. 3. Fracture through the distal medial malleolus. Electronically Signed   By: Dorise Bullion III M.D   On: 05/14/2018 00:14   Dg Ankle Complete Right  Result Date: 05/14/2018 CLINICAL DATA:  Pain after fall EXAM: RIGHT ANKLE - COMPLETE 3+ VIEW COMPARISON:  None. FINDINGS: There is a comminuted fracture through the distal fibular diaphysis. There is a fracture through the medial malleolus. There also be appears to be a fracture through the distal tibia where it abuts the fibula. Irregularity of the distal most fibula is consistent with an age indeterminate fracture but may not be acute. Soft tissue swelling. IMPRESSION: 1. Fracture through the medial malleolus. 2. Apparent fracture through the lateral aspect of the distal tibia with displacement. 3. Fracture through the fibular diaphysis distally. 4. Irregularity of the distal tip of the fibula may be nonacute from previous injury. 5. No definitive posterior malleolar fracture. However, given the complex nature of the ankle fracture, consider a CT scan for better evaluation. Electronically Signed   By: Dorise Bullion III M.D   On: 05/14/2018 00:17   Ct Head Wo Contrast  Result Date: 05/13/2018 CLINICAL DATA:  67 year old female with facial trauma. EXAM: CT HEAD WITHOUT CONTRAST CT MAXILLOFACIAL WITHOUT CONTRAST CT CERVICAL SPINE WITHOUT CONTRAST TECHNIQUE: Multidetector CT imaging of the head, cervical spine, and maxillofacial structures  were performed using the standard protocol without intravenous contrast. Multiplanar CT image reconstructions of the cervical spine and maxillofacial structures were also generated. COMPARISON:  Head CT dated 07/23/2007 FINDINGS:  CT HEAD FINDINGS Brain: The ventricles and sulci appropriate size for patient's age. The gray-white matter discrimination is preserved. There is no acute intracranial hemorrhage. No mass effect or midline shift. No extra-axial fluid collection. Vascular: No hyperdense vessel or unexpected calcification. Skull: Normal. Negative for fracture or focal lesion. Other: None. CT MAXILLOFACIAL FINDINGS Osseous: No fracture or mandibular dislocation. No destructive process. Orbits: Negative. No traumatic or inflammatory finding. Sinuses: Clear. Soft tissues: Negative. CT CERVICAL SPINE FINDINGS Alignment: No acute subluxation. Straightening of normal cervical lordosis which may be positional or due to muscle spasm. Skull base and vertebrae: No acute fracture. Soft tissues and spinal canal: No prevertebral fluid or swelling. No visible canal hematoma. Disc levels:  Degenerative changes at C5-C6. Upper chest: Negative. Other: None IMPRESSION: 1. No acute intracranial pathology. 2. No acute facial bone fractures. 3. No acute/traumatic cervical spine pathology. Electronically Signed   By: Anner Crete M.D.   On: 05/13/2018 23:40   Ct Chest W Contrast  Result Date: 05/14/2018 CLINICAL DATA:  67 year old female with trauma. EXAM: CT CHEST, ABDOMEN, AND PELVIS WITH CONTRAST TECHNIQUE: Multidetector CT imaging of the chest, abdomen and pelvis was performed following the standard protocol during bolus administration of intravenous contrast. CONTRAST:  132mL ISOVUE-300 IOPAMIDOL (ISOVUE-300) INJECTION 61% COMPARISON:  CT of the abdomen pelvis dated 12/02/2016 FINDINGS: CT CHEST FINDINGS Cardiovascular: There is mild cardiomegaly. No pericardial effusion. Multi vessel coronary vascular calcification.  Mild atherosclerotic calcification of the thoracic aorta. No aneurysmal dilatation or dissection. The visualized origins of the great vessels of the aortic arch are patent. The central pulmonary arteries are grossly unremarkable. Mediastinum/Nodes: No hilar or mediastinal adenopathy. Esophagus and thyroid gland are grossly unremarkable. No mediastinal fluid collection. Lungs/Pleura: Bibasilar linear atelectasis/scarring. The lungs are clear. There is no pleural effusion or pneumothorax. The central airways are patent. Musculoskeletal: No chest wall mass or suspicious bone lesions identified. CT ABDOMEN PELVIS FINDINGS No intra-abdominal free air or free fluid. Hepatobiliary: The liver is unremarkable. Cholecystectomy. Mild intrahepatic biliary ductal dilatation. Pancreas: Unremarkable. No pancreatic ductal dilatation or surrounding inflammatory changes. Spleen: Normal in size without focal abnormality. Adrenals/Urinary Tract: The adrenal glands are unremarkable. Small nonobstructing right renal inferior pole hypodense focus which is too small to characterize. There is no hydronephrosis on either side. There is symmetric enhancement and excretion of contrast by both kidneys. The visualized ureters and urinary bladder appear unremarkable. Stomach/Bowel: There is sigmoid diverticulosis without active inflammatory changes. A gastric lap band is noted. There is no bowel obstruction or active inflammation. The appendix is not visualized with certainty. No inflammatory changes identified in the right lower quadrant. Vascular/Lymphatic: Moderate aortoiliac atherosclerotic disease. No portal venous gas. There is no adenopathy. Reproductive: The uterus is anteverted and grossly unremarkable. Other: None Musculoskeletal: Grade 1 L4-L5 anterolisthesis. No acute osseous pathology. IMPRESSION: No acute intrathoracic, abdominal, or pelvic pathology. Electronically Signed   By: Anner Crete M.D.   On: 05/14/2018 02:42   Ct  Cervical Spine Wo Contrast  Result Date: 05/13/2018 CLINICAL DATA:  67 year old female with facial trauma. EXAM: CT HEAD WITHOUT CONTRAST CT MAXILLOFACIAL WITHOUT CONTRAST CT CERVICAL SPINE WITHOUT CONTRAST TECHNIQUE: Multidetector CT imaging of the head, cervical spine, and maxillofacial structures were performed using the standard protocol without intravenous contrast. Multiplanar CT image reconstructions of the cervical spine and maxillofacial structures were also generated. COMPARISON:  Head CT dated 07/23/2007 FINDINGS: CT HEAD FINDINGS Brain: The ventricles and sulci appropriate size for patient's age. The gray-white matter discrimination is preserved. There is  no acute intracranial hemorrhage. No mass effect or midline shift. No extra-axial fluid collection. Vascular: No hyperdense vessel or unexpected calcification. Skull: Normal. Negative for fracture or focal lesion. Other: None. CT MAXILLOFACIAL FINDINGS Osseous: No fracture or mandibular dislocation. No destructive process. Orbits: Negative. No traumatic or inflammatory finding. Sinuses: Clear. Soft tissues: Negative. CT CERVICAL SPINE FINDINGS Alignment: No acute subluxation. Straightening of normal cervical lordosis which may be positional or due to muscle spasm. Skull base and vertebrae: No acute fracture. Soft tissues and spinal canal: No prevertebral fluid or swelling. No visible canal hematoma. Disc levels:  Degenerative changes at C5-C6. Upper chest: Negative. Other: None IMPRESSION: 1. No acute intracranial pathology. 2. No acute facial bone fractures. 3. No acute/traumatic cervical spine pathology. Electronically Signed   By: Anner Crete M.D.   On: 05/13/2018 23:40   Ct Knee Right Wo Contrast  Result Date: 05/14/2018 CLINICAL DATA:  67 year old female with fall and right knee pain. EXAM: CT OF THE right KNEE WITHOUT CONTRAST TECHNIQUE: Multidetector CT imaging of the right knee was performed according to the standard protocol.  Multiplanar CT image reconstructions were also generated. COMPARISON:  Earlier radiograph dated 05/13/2018 FINDINGS: Bones/Joint/Cartilage There is a comminuted intra-articular fracture of the proximal tibia with mild depression of the lateral tibial plateau. The bones are osteopenic. There is no dislocation. There is a large suprapatellar lipohemarthrosis. Ligaments Suboptimally assessed by CT. Muscles and Tendons No acute findings. No intramuscular hematoma. Soft tissues There is diffuse subcutaneous stranding of the lateral knee. No fluid collection. IMPRESSION: 1. Comminuted intra-articular fracture of the proximal tibia with mild depression of the lateral tibial plateau. 2. Large suprapatellar lipohemarthrosis. Electronically Signed   By: Anner Crete M.D.   On: 05/14/2018 02:33   Ct Abdomen Pelvis W Contrast  Result Date: 05/14/2018 CLINICAL DATA:  67 year old female with trauma. EXAM: CT CHEST, ABDOMEN, AND PELVIS WITH CONTRAST TECHNIQUE: Multidetector CT imaging of the chest, abdomen and pelvis was performed following the standard protocol during bolus administration of intravenous contrast. CONTRAST:  15mL ISOVUE-300 IOPAMIDOL (ISOVUE-300) INJECTION 61% COMPARISON:  CT of the abdomen pelvis dated 12/02/2016 FINDINGS: CT CHEST FINDINGS Cardiovascular: There is mild cardiomegaly. No pericardial effusion. Multi vessel coronary vascular calcification. Mild atherosclerotic calcification of the thoracic aorta. No aneurysmal dilatation or dissection. The visualized origins of the great vessels of the aortic arch are patent. The central pulmonary arteries are grossly unremarkable. Mediastinum/Nodes: No hilar or mediastinal adenopathy. Esophagus and thyroid gland are grossly unremarkable. No mediastinal fluid collection. Lungs/Pleura: Bibasilar linear atelectasis/scarring. The lungs are clear. There is no pleural effusion or pneumothorax. The central airways are patent. Musculoskeletal: No chest wall mass  or suspicious bone lesions identified. CT ABDOMEN PELVIS FINDINGS No intra-abdominal free air or free fluid. Hepatobiliary: The liver is unremarkable. Cholecystectomy. Mild intrahepatic biliary ductal dilatation. Pancreas: Unremarkable. No pancreatic ductal dilatation or surrounding inflammatory changes. Spleen: Normal in size without focal abnormality. Adrenals/Urinary Tract: The adrenal glands are unremarkable. Small nonobstructing right renal inferior pole hypodense focus which is too small to characterize. There is no hydronephrosis on either side. There is symmetric enhancement and excretion of contrast by both kidneys. The visualized ureters and urinary bladder appear unremarkable. Stomach/Bowel: There is sigmoid diverticulosis without active inflammatory changes. A gastric lap band is noted. There is no bowel obstruction or active inflammation. The appendix is not visualized with certainty. No inflammatory changes identified in the right lower quadrant. Vascular/Lymphatic: Moderate aortoiliac atherosclerotic disease. No portal venous gas. There is no adenopathy. Reproductive: The  uterus is anteverted and grossly unremarkable. Other: None Musculoskeletal: Grade 1 L4-L5 anterolisthesis. No acute osseous pathology. IMPRESSION: No acute intrathoracic, abdominal, or pelvic pathology. Electronically Signed   By: Anner Crete M.D.   On: 05/14/2018 02:42   Ct Ankle Right Wo Contrast  Result Date: 05/14/2018 CLINICAL DATA:  67 year old female with fall and right ankle pain. EXAM: CT OF THE RIGHT ANKLE WITHOUT CONTRAST TECHNIQUE: Multidetector CT imaging of the right ankle was performed according to the standard protocol. Multiplanar CT image reconstructions were also generated. COMPARISON:  Right ankle radiograph dated 05/13/2018 FINDINGS: Bones/Joint/Cartilage There is a comminuted fracture of the distal fibula with extension of the fracture into the distal tibia-fibular syndesmosis. There is a minimally  displaced fracture of the lateral aspect of the tibial plafond and nondisplaced fracture of the posterior malleolus. There is a minimally displaced fracture of the medial malleolus. There bones are osteopenic. Discontinuity of the plantar cortex of the medial cuneiform (series 5, image 58 and sagittal series 6, image 89) concerning for an avulsion fracture. There is also linear lucency through the plantar base of the first metatarsal (series 6, image 90) also concerning for an avulsion corner fracture. Probable nondisplaced fracture of the dorsal aspect of the medial cuneiform (series 4, image 146 and sagittal series 6, image 69). Fracture of the base of the second metatarsal (series 6, image 73 and series 4, image 161). Nondisplaced linear lucency through the proximal fourth metatarsal (series 6, image 58) may be artifactual or represent a vascular groove or a nondisplaced fracture. No dislocation. Diffuse soft tissue edema. Ligaments Suboptimally assessed by CT. Muscles and Tendons No acute intramuscular findings. Soft tissues Diffuse subcutaneous edema. IMPRESSION: 1. Comminuted fracture of the distal fibula with extension of the fracture into the distal tibia-fibular syndesmosis. 2. Minimally displaced fracture of the lateral aspect of the tibial plafond and nondisplaced fracture of the posterior malleolus. 3. Minimally displaced fracture of the medial malleolus. 4. Nondisplaced fracture of the base of the second metatarsal. 5. Linear lucency through the plantar cortex of the medial cuneiform concerning for an avulsion fracture. 6. Linear lucency through the proximal fourth metatarsal may be artifactual or represent a nondisplaced fracture. Electronically Signed   By: Anner Crete M.D.   On: 05/14/2018 02:55   Dg Shoulder Left  Result Date: 05/14/2018 CLINICAL DATA:  General left shoulder pain.  Pain after fall. EXAM: LEFT SHOULDER - 2+ VIEW COMPARISON:  None. FINDINGS: There is no evidence of fracture  or dislocation. There is no evidence of arthropathy or other focal bone abnormality. Soft tissues are unremarkable. IMPRESSION: Negative. Electronically Signed   By: Dorise Bullion III M.D   On: 05/14/2018 00:07   Dg Knee Right Port  Result Date: 05/15/2018 CLINICAL DATA:  Postoperative right knee EXAM: PORTABLE RIGHT KNEE - 1-2 VIEW COMPARISON:  Intraoperative fluoroscopy 05/15/2018 FINDINGS: Postoperative changes with lateral plate and screw fixation of the proximal tibial metaphysis and lateral tibial plateau. Hardware components appear intact and well seated. Degenerative changes in the right knee. Soft tissue gas consistent with recent surgery. IMPRESSION: Postoperative internal fixation of lateral tibial plateau fracture. Electronically Signed   By: Lucienne Capers M.D.   On: 05/15/2018 22:06   Dg Ankle Right Port  Result Date: 05/15/2018 CLINICAL DATA:  Postoperative right ankle EXAM: PORTABLE RIGHT ANKLE - 2 VIEW COMPARISON:  Intraoperative fluoroscopy 05/15/2018 FINDINGS: Postoperative changes with plate and screw fixation of a fracture of the distal fibular shaft and screw fixation of the medial malleolar  fracture fragment. Hardware components appear intact and well seated. Near-anatomic alignment of the fracture fragments. Overlying splint material obscures bone detail. IMPRESSION: Postoperative internal fixation of fractures of the right distal fibula and medial malleolus. Electronically Signed   By: Lucienne Capers M.D.   On: 05/15/2018 22:08   Dg Hand Complete Right  Result Date: 05/14/2018 CLINICAL DATA:  67 year old female with fall and trauma to the right hand. EXAM: RIGHT HAND - COMPLETE 3+ VIEW COMPARISON:  Right wrist radiograph dated 05/13/2018 FINDINGS: Comminuted mildly displaced intra-articular fracture of the distal radius as seen on the earlier wrist radiograph. No other acute fracture noted. The bones are osteopenic. No dislocation. Soft tissue swelling of the wrist.  IMPRESSION: Comminuted mildly displaced intra-articular fracture of the distal radius. Electronically Signed   By: Anner Crete M.D.   On: 05/14/2018 01:09   Dg C-arm 1-60 Min  Result Date: 05/15/2018 CLINICAL DATA:  67 year old female with right distal radial fracture. Subsequent encounter. EXAM: DG C-ARM 61-120 MIN; RIGHT WRIST - COMPLETE 3+ VIEW Fluoroscopic time: 49.7 seconds. COMPARISON:  05/14/2018. FINDINGS: Five intraoperative C-arm views submitted for review after surgery. Distal right radial fracture reduced utilizing plate and screws. Much better alignment of fracture fragments with minimal separation/incongruity of articular surface. IMPRESSION: Open reduction and internal fixation distal right radius fracture. Electronically Signed   By: Genia Del M.D.   On: 05/15/2018 20:07   Dg C-arm 1-60 Min  Result Date: 05/15/2018 CLINICAL DATA:  67 year old female with tibial plateau fracture and right ankle fracture. Subsequent encounter. EXAM: RIGHT TIBIA AND FIBULA - 2 VIEW; DG C-ARM 61-120 MIN Fluoroscopic time: 3 minutes and 8 seconds. COMPARISON:  05/14/2018 CT. FINDINGS: Eleven intraoperative film submitted for review after surgery. Six intraoperative C-arm views of the right knee: Reduction of lateral tibial plateau fracture with sideplate and screws. Articular surface with better alignment although with slight depression. Five intraoperative C-arm views of the right ankle: Reduction of fibular fracture with sideplate and screws. Screws transfix the distal fibula-tibia joint space. Reduction of medial malleolar fracture with screw. Evaluation of posterior tibial fracture limited secondary to overlying plate. IMPRESSION: Open reduction and internal fixation right knee and right ankle fractures as noted above. Electronically Signed   By: Genia Del M.D.   On: 05/15/2018 17:57   Dg C-arm 1-60 Min  Result Date: 05/15/2018 CLINICAL DATA:  67 year old female with tibial plateau fracture  and right ankle fracture. Subsequent encounter. EXAM: RIGHT TIBIA AND FIBULA - 2 VIEW; DG C-ARM 61-120 MIN Fluoroscopic time: 3 minutes and 8 seconds. COMPARISON:  05/14/2018 CT. FINDINGS: Eleven intraoperative film submitted for review after surgery. Six intraoperative C-arm views of the right knee: Reduction of lateral tibial plateau fracture with sideplate and screws. Articular surface with better alignment although with slight depression. Five intraoperative C-arm views of the right ankle: Reduction of fibular fracture with sideplate and screws. Screws transfix the distal fibula-tibia joint space. Reduction of medial malleolar fracture with screw. Evaluation of posterior tibial fracture limited secondary to overlying plate. IMPRESSION: Open reduction and internal fixation right knee and right ankle fractures as noted above. Electronically Signed   By: Genia Del M.D.   On: 05/15/2018 17:57   Ct Maxillofacial Wo Contrast  Result Date: 05/13/2018 CLINICAL DATA:  67 year old female with facial trauma. EXAM: CT HEAD WITHOUT CONTRAST CT MAXILLOFACIAL WITHOUT CONTRAST CT CERVICAL SPINE WITHOUT CONTRAST TECHNIQUE: Multidetector CT imaging of the head, cervical spine, and maxillofacial structures were performed using the standard protocol without intravenous contrast.  Multiplanar CT image reconstructions of the cervical spine and maxillofacial structures were also generated. COMPARISON:  Head CT dated 07/23/2007 FINDINGS: CT HEAD FINDINGS Brain: The ventricles and sulci appropriate size for patient's age. The gray-white matter discrimination is preserved. There is no acute intracranial hemorrhage. No mass effect or midline shift. No extra-axial fluid collection. Vascular: No hyperdense vessel or unexpected calcification. Skull: Normal. Negative for fracture or focal lesion. Other: None. CT MAXILLOFACIAL FINDINGS Osseous: No fracture or mandibular dislocation. No destructive process. Orbits: Negative. No traumatic  or inflammatory finding. Sinuses: Clear. Soft tissues: Negative. CT CERVICAL SPINE FINDINGS Alignment: No acute subluxation. Straightening of normal cervical lordosis which may be positional or due to muscle spasm. Skull base and vertebrae: No acute fracture. Soft tissues and spinal canal: No prevertebral fluid or swelling. No visible canal hematoma. Disc levels:  Degenerative changes at C5-C6. Upper chest: Negative. Other: None IMPRESSION: 1. No acute intracranial pathology. 2. No acute facial bone fractures. 3. No acute/traumatic cervical spine pathology. Electronically Signed   By: Anner Crete M.D.   On: 05/13/2018 23:40     CBC Recent Labs  Lab 05/14/18 0050 05/15/18 0443 05/16/18 0344 05/17/18 0304  WBC 18.7* 9.2 10.9* 8.8  HGB 11.2* 10.1* 9.1* 7.8*  HCT 35.4* 32.4* 29.0* 24.8*  PLT 317 196 224 194  MCV 94.7 95.6 95.4 93.2  MCH 29.9 29.8 29.9 29.3  MCHC 31.6 31.2 31.4 31.5  RDW 14.7 14.6 14.5 14.4  LYMPHSABS 1.6  --  0.4* 1.3  MONOABS 1.2*  --  0.5 0.7  EOSABS 0.1  --  0.0 0.0  BASOSABS 0.1  --  0.0 0.0    Chemistries  Recent Labs  Lab 05/14/18 0050 05/15/18 0443 05/16/18 0344 05/17/18 0304  NA 135 134* 132* 134*  K 3.4* 4.5 4.4 3.7  CL 101 103 99 95*  CO2 25 24 26 31   GLUCOSE 142* 115* 161* 121*  BUN 14 12 9 8   CREATININE 0.66 0.54 0.56 0.56  CALCIUM 8.8* 8.8* 8.3* 8.2*  MG  --  2.1  --   --    ------------------------------------------------------------------------------------------------------------------ No results for input(s): CHOL, HDL, LDLCALC, TRIG, CHOLHDL, LDLDIRECT in the last 72 hours.  Lab Results  Component Value Date   HGBA1C 5.5 03/11/2016   ------------------------------------------------------------------------------------------------------------------ No results for input(s): TSH, T4TOTAL, T3FREE, THYROIDAB in the last 72 hours.  Invalid input(s):  FREET3 ------------------------------------------------------------------------------------------------------------------ No results for input(s): VITAMINB12, FOLATE, FERRITIN, TIBC, IRON, RETICCTPCT in the last 72 hours.  Coagulation profile No results for input(s): INR, PROTIME in the last 168 hours.  No results for input(s): DDIMER in the last 72 hours.  Cardiac Enzymes No results for input(s): CKMB, TROPONINI, MYOGLOBIN in the last 168 hours.  Invalid input(s): CK ------------------------------------------------------------------------------------------------------------------    Component Value Date/Time   BNP 106.2 (H) 12/03/2016 2671     Roxan Hockey M.D on 05/17/2018 at 1:23 PM  Pager---[phone] Go to [email or web address] - password [email or web address] for contact info  [facility] - Office  [phone]

## 2018-05-17 NOTE — Progress Notes (Signed)
Notified MD via amion text of k=3.4.  Paulla Fore, RN

## 2018-05-17 NOTE — NC FL2 (Signed)
MEDICAID FL2 LEVEL OF CARE SCREENING TOOL     IDENTIFICATION  Patient Name: Cindy Holmes Birthdate: 1950/10/29 Sex: female Admission Date (Current Location): 05/13/2018  [facility] and [place] Number:  Herbalist and Address:  [facility] [place]. [facility], [place] [address] [place], [place] [address]      Provider Number: [id number]  Attending Physician Name and Address:  Roxan Hockey, MD  Relative Name and Phone Number:  Herbie Baltimore (spouse) [phone]    Current Level of Care: Hospital Recommended Level of Care: [place] Prior Approval Number:    Date Approved/Denied: 05/17/18 PASRR Number: [id number]  Discharge Plan: SNF    Current Diagnoses: Patient Active Problem List   Diagnosis Date Noted  . Fall at home, initial encounter 05/17/2018  . Multiple fractures 05/14/2018  . Fracture of distal end of right radius 05/14/2018  . Tibial plateau fracture, right, closed, initial encounter 05/14/2018  . Candida esophagitis ([place]) 12/03/2016  . Hypokalemia 12/02/2016  . Lactic acidosis 12/02/2016  . AKI (acute kidney injury) ([place]) 12/02/2016  . Chronic diastolic CHF (congestive heart failure) ([place]) 12/02/2016  . Coronary artery disease involving native coronary artery of native heart without angina pectoris 11/13/2015  . Chest pain 11/13/2015  . Hyperlipemia 11/13/2015  . Acute on chronic diastolic CHF (congestive heart failure), NYHA class 3 ([place]) 11/13/2015  . Pain in joint, shoulder region 01/16/2014  . Muscle weakness (generalized) 01/16/2014  . Decreased range of motion of right shoulder 01/16/2014  . Right rotator cuff tear 11/16/2013  . Rotator cuff tear 11/16/2013  . Rheumatoid arthritis ([place])   . Essential hypertension   . Hyperlipidemia   . Stroke ([place])   . Emotional stress reaction   . Leg pain   . Carotid artery disease ([place])   . Overweight 11/04/2008  . EDEMA 11/04/2008    Orientation RESPIRATION BLADDER  Height & Weight     Self, Time, Situation, Place  O2(2L/min) Continent, External catheter(urethral catherter peri-operative use for selective surgical procedure) Weight: 221 lb 1.9 oz (100.3 kg) Height:  5\' 3"  (160 cm)  BEHAVIORAL SYMPTOMS/MOOD NEUROLOGICAL BOWEL NUTRITION STATUS      Continent Diet(see discharge summary)  AMBULATORY STATUS COMMUNICATION OF NEEDS Skin   Limited Assist Verbally Surgical wounds(closed surgical incisions on right leg and right wrist, ecchymosis on left buttocks, hip, and shoulder)                       Personal Care Assistance Level of Assistance  Bathing, Feeding, Dressing, Total care Bathing Assistance: Limited assistance Feeding assistance: Independent Dressing Assistance: Limited assistance Total Care Assistance: Limited assistance   Functional Limitations Info  Sight, Hearing, Speech Sight Info: Adequate Hearing Info: Adequate Speech Info: Adequate    SPECIAL CARE FACTORS FREQUENCY  PT (By licensed PT), OT (By licensed OT)     PT Frequency: min 2x weekly OT Frequency: min 2x weekly            Contractures Contractures Info: Not present    Additional Factors Info  Allergies   Allergies Info: Allergies:  Codeine Phosphate, Amoxicillin, Penicillins, Morphine And Related, Lidocaine           Current Medications (05/17/2018):  This is the current hospital active medication list Current Facility-Administered Medications  Medication Dose Route Frequency Provider Last Rate Last Dose  . 0.9 %  sodium chloride infusion   Intravenous PRN Haddix, Thomasene Lot, MD 10 mL/hr at 05/14/18 0800    .  acetaminophen (TYLENOL) tablet 650 mg  650 mg Oral Q6H PRN Haddix, Thomasene Lot, MD   650 mg at 05/15/18 0810   Or  . acetaminophen (TYLENOL) suppository 650 mg  650 mg Rectal Q6H PRN Haddix, Thomasene Lot, MD      . Derrill Memo ON 05/18/2018] alendronate (FOSAMAX) tablet 70 mg  70 mg Oral Weekly Emokpae, Courage, MD      . allopurinol (ZYLOPRIM) tablet 400 mg  400 mg  Oral Daily Haddix, Thomasene Lot, MD   400 mg at 05/17/18 1054  . alum & mag hydroxide-simeth (MAALOX/MYLANTA) 200-200-20 MG/5ML suspension 30 mL  30 mL Oral Q4H PRN Haddix, Thomasene Lot, MD      . aspirin chewable tablet 81 mg  81 mg Oral Daily Samuella Cota, MD   81 mg at 05/17/18 1054  . atorvastatin (LIPITOR) tablet 80 mg  80 mg Oral q1800 Haddix, Thomasene Lot, MD   80 mg at 05/16/18 1752  . clopidogrel (PLAVIX) tablet 75 mg  75 mg Oral Daily Samuella Cota, MD   75 mg at 05/17/18 1054  . diphenhydrAMINE (BENADRYL) injection 12.5 mg  12.5 mg Intravenous Q6H PRN Haddix, Thomasene Lot, MD       Or  . diphenhydrAMINE (BENADRYL) 12.5 MG/5ML elixir 12.5 mg  12.5 mg Oral Q6H PRN Haddix, Thomasene Lot, MD      . enoxaparin (LOVENOX) injection 40 mg  40 mg Subcutaneous Q24H Haddix, Thomasene Lot, MD   40 mg at 05/17/18 1056  . folic acid (FOLVITE) tablet 1 mg  1 mg Oral Daily Haddix, Thomasene Lot, MD   1 mg at 05/17/18 1055  . furosemide (LASIX) tablet 40 mg  40 mg Oral Daily Haddix, Thomasene Lot, MD   40 mg at 05/17/18 1054  . gabapentin (NEURONTIN) capsule 300 mg  300 mg Oral TID Haddix, Thomasene Lot, MD   300 mg at 05/17/18 1054  . HYDROmorphone (DILAUDID) 1 mg/mL PCA injection   Intravenous Q4H Haddix, Thomasene Lot, MD   0.5 mg at 05/13/18 0600  . lactated ringers infusion   Intravenous Continuous Haddix, Thomasene Lot, MD 10 mL/hr at 05/15/18 2352    . loratadine (CLARITIN) tablet 10 mg  10 mg Oral Daily Haddix, Thomasene Lot, MD   10 mg at 05/17/18 1053  . metoprolol tartrate (LOPRESSOR) injection 2.5 mg  2.5 mg Intravenous Q12H Haddix, Thomasene Lot, MD   2.5 mg at 05/17/18 1104  . naloxone (NARCAN) injection 0.4 mg  0.4 mg Intravenous PRN Haddix, Thomasene Lot, MD       And  . sodium chloride flush (NS) 0.9 % injection 9 mL  9 mL Intravenous PRN Haddix, Thomasene Lot, MD      . ondansetron (ZOFRAN) tablet 4 mg  4 mg Oral Q6H PRN Haddix, Thomasene Lot, MD       Or  . ondansetron (ZOFRAN) injection 4 mg  4 mg Intravenous Q6H PRN Haddix, Thomasene Lot, MD      . ondansetron  (ZOFRAN) injection 4 mg  4 mg Intravenous Q6H PRN Haddix, Thomasene Lot, MD   4 mg at 05/15/18 1103  . pantoprazole (PROTONIX) EC tablet 80 mg  80 mg Oral Q1200 Haddix, Thomasene Lot, MD   80 mg at 05/17/18 1230  . predniSONE (DELTASONE) tablet 20 mg  20 mg Oral Q breakfast Haddix, Thomasene Lot, MD   20 mg at 05/17/18 0848  . ramipril (ALTACE) capsule 2.5 mg  2.5 mg Oral Daily Haddix, Thomasene Lot, MD  2.5 mg at 05/17/18 1055  . senna-docusate (Senokot-S) tablet 1 tablet  1 tablet Oral Daily PRN Haddix, Thomasene Lot, MD         Discharge Medications: Please see discharge summary for a list of discharge medications.  Relevant Imaging Results:  Relevant Lab Results:   Additional Information SSN: [id number]  Alberteen Sam, LCSW

## 2018-05-18 ENCOUNTER — Telehealth (HOSPITAL_COMMUNITY): Payer: Self-pay

## 2018-05-18 LAB — BASIC METABOLIC PANEL
Anion gap: 9 (ref 5–15)
BUN: 9 mg/dL (ref 8–23)
CO2: 28 mmol/L (ref 22–32)
Calcium: 8.3 mg/dL — ABNORMAL LOW (ref 8.9–10.3)
Chloride: 98 mmol/L (ref 98–111)
Creatinine, Ser: 0.55 mg/dL (ref 0.44–1.00)
GFR calc Af Amer: >60 mL/min (ref >60)
Glucose, Bld: 115 mg/dL — ABNORMAL HIGH (ref 70–99)
POTASSIUM: 4.1 mmol/L (ref 3.5–5.1)
SODIUM: 135 mmol/L (ref 135–145)

## 2018-05-18 LAB — CBC WITH DIFFERENTIAL/PLATELET
ABS IMMATURE GRANULOCYTES: 0.05 10*3/uL (ref 0.00–0.07)
BASOS ABS: 0 10*3/uL (ref 0.0–0.1)
BASOS PCT: 0 %
Eosinophils Absolute: 0.1 10*3/uL (ref 0.0–0.5)
Eosinophils Relative: 1 %
HCT: 25.2 % — ABNORMAL LOW (ref 36.0–46.0)
Hemoglobin: 7.9 g/dL — ABNORMAL LOW (ref 12.0–15.0)
Immature Granulocytes: 1 %
Lymphocytes Relative: 20 %
Lymphs Abs: 1.8 10*3/uL (ref 0.7–4.0)
MCH: 29.4 pg (ref 26.0–34.0)
MCHC: 31.3 g/dL (ref 30.0–36.0)
MCV: 93.7 fL (ref 80.0–100.0)
MONO ABS: 0.7 10*3/uL (ref 0.1–1.0)
Monocytes Relative: 8 %
NEUTROS ABS: 6.1 10*3/uL (ref 1.7–7.7)
NEUTROS PCT: 70 %
NRBC: 0 % (ref 0.0–0.2)
PLATELETS: 215 10*3/uL (ref 150–400)
RBC: 2.69 MIL/uL — AB (ref 3.87–5.11)
RDW: 14.5 % (ref 11.5–15.5)
WBC: 8.7 10*3/uL (ref 4.0–10.5)

## 2018-05-18 MED ORDER — KETOROLAC TROMETHAMINE 15 MG/ML IJ SOLN
15.0000 mg | Freq: Four times a day (QID) | INTRAMUSCULAR | Status: AC
  Administered 2018-05-18 – 2018-05-19 (×5): 15 mg via INTRAVENOUS
  Filled 2018-05-18 (×5): qty 1

## 2018-05-18 MED ORDER — SENNOSIDES-DOCUSATE SODIUM 8.6-50 MG PO TABS
2.0000 | ORAL_TABLET | Freq: Two times a day (BID) | ORAL | Status: DC
  Administered 2018-05-18 – 2018-05-21 (×8): 2 via ORAL
  Filled 2018-05-18 (×9): qty 2

## 2018-05-18 MED ORDER — BISACODYL 10 MG RE SUPP
10.0000 mg | Freq: Once | RECTAL | Status: DC
  Filled 2018-05-18: qty 1

## 2018-05-18 MED ORDER — HYDROMORPHONE HCL 2 MG PO TABS
2.0000 mg | ORAL_TABLET | ORAL | Status: DC | PRN
  Administered 2018-05-18 – 2018-05-22 (×19): 2 mg via ORAL
  Filled 2018-05-18 (×19): qty 1

## 2018-05-18 MED ORDER — FERROUS SULFATE 325 (65 FE) MG PO TABS
325.0000 mg | ORAL_TABLET | Freq: Three times a day (TID) | ORAL | Status: DC
  Administered 2018-05-18 – 2018-05-22 (×12): 325 mg via ORAL
  Filled 2018-05-18 (×13): qty 1

## 2018-05-18 MED ORDER — HYDROMORPHONE HCL 1 MG/ML IJ SOLN
1.0000 mg | INTRAMUSCULAR | Status: DC | PRN
  Administered 2018-05-19 (×2): 1 mg via INTRAVENOUS
  Filled 2018-05-18 (×2): qty 1

## 2018-05-18 NOTE — Telephone Encounter (Signed)
Encounter complete. 

## 2018-05-18 NOTE — Plan of Care (Signed)

## 2018-05-18 NOTE — Progress Notes (Signed)
PT Cancellation Note  Patient Details Name: Cindy Holmes MRN: [id number] DOB: 02/15/1951   Cancelled Treatment:    Reason Eval/Treat Not Completed: Other (comment) Second attempt to see pt this am. Still waiting for Bledsoe brace, CAM boot, and wrist splint prior to mobilization. PT will continue to follow.    Salina April, PTA Acute Rehabilitation Services Pager: [phone] Office: [phone]   05/18/2018, 11:26 AM

## 2018-05-18 NOTE — Progress Notes (Signed)
Orthopedic Tech Progress Note Patient Details:  Cindy Holmes 05/22/1951 [id number]  Ortho Devices Type of Ortho Device: CAM walker, Wrist splint Ortho Device/Splint Interventions: Application   Post Interventions Patient Tolerated: Well   Melony Overly T 05/18/2018, 12:56 PM

## 2018-05-18 NOTE — Progress Notes (Signed)
Orthopaedic Trauma Progress Note  S: Still having a lot of pain, states that dilaudid oral form is only thing that does not make her nauseous  O:  Vitals:   05/18/18 0352 05/18/18 0355  BP: 128/62   Pulse: 78   Resp: 20 15  Temp: 98.9 F (37.2 C)   SpO2: 100% 100%   General: No acute distress patient is awake and alert. Right upper extremity: Dressing in place.  Sensation and motor function is intact to median, radial and ulnar nerve distribution. Right lower extremity: Splint taken down, incisions clean, dry and intact  Imaging: Stable postop imaging  Labs:  Results for orders placed or performed during the hospital encounter of 05/13/18 (from the past 24 hour(s))  CBC with Differential/Platelet     Status: Abnormal   Collection Time: 05/18/18  2:31 AM  Result Value Ref Range   WBC 8.7 4.0 - 10.5 K/uL   RBC 2.69 (L) 3.87 - 5.11 MIL/uL   Hemoglobin 7.9 (L) 12.0 - 15.0 g/dL   HCT 25.2 (L) 36.0 - 46.0 %   MCV 93.7 80.0 - 100.0 fL   MCH 29.4 26.0 - 34.0 pg   MCHC 31.3 30.0 - 36.0 g/dL   RDW 14.5 11.5 - 15.5 %   Platelets 215 150 - 400 K/uL   nRBC 0.0 0.0 - 0.2 %   Neutrophils Relative % 70 %   Neutro Abs 6.1 1.7 - 7.7 K/uL   Lymphocytes Relative 20 %   Lymphs Abs 1.8 0.7 - 4.0 K/uL   Monocytes Relative 8 %   Monocytes Absolute 0.7 0.1 - 1.0 K/uL   Eosinophils Relative 1 %   Eosinophils Absolute 0.1 0.0 - 0.5 K/uL   Basophils Relative 0 %   Basophils Absolute 0.0 0.0 - 0.1 K/uL   Immature Granulocytes 1 %   Abs Immature Granulocytes 0.05 0.00 - 0.07 K/uL  Basic metabolic panel     Status: Abnormal   Collection Time: 05/18/18  2:31 AM  Result Value Ref Range   Sodium 135 135 - 145 mmol/L   Potassium 4.1 3.5 - 5.1 mmol/L   Chloride 98 98 - 111 mmol/L   CO2 28 22 - 32 mmol/L   Glucose, Bld 115 (H) 70 - 99 mg/dL   BUN 9 8 - 23 mg/dL   Creatinine, Ser 0.55 0.44 - 1.00 mg/dL   Calcium 8.3 (L) 8.9 - 10.3 mg/dL   GFR calc non Af Amer >60 >60 mL/min   GFR calc Af Amer  >60 >60 mL/min   Anion gap 9 5 - 15    Assessment: 67 year old female s/p fall down stairs  Injuries: 1. Right lateral tibial plateau fracture s/p ORIF 2. Right trimalleolar ankle fracture s/p ORIF 3. Right distal radius fracture s/p ORIF   Weightbearing: NWB RLE, WBAT RUE thru elbow  Insicional and dressing care: Dry dressings PRN  Orthopedic device(s):Right wrist brace, right boot  CV/Blood loss:Hgb this AM 7.9 this AM. Continue to monitor Hemodynamically stable  Pain management: Discontinue dilaudid PCA start: 1. Oral dilaudid 2 gm q 3 hours PRN 2. IV dilaudid 1 gm q 2 hours PRN for breakthrough 3. Scheduled tylenol 650 mg q 6 hours 4. Toradol 15 mg IV q 6 hours  VTE prophylaxis: Lovenox 40 mg today  ID: Ancef 2gm postoperatively-Completed  Foley/Lines: Medlock IVF  Medical co-morbidities: 1. RA/Lupus-Stopped DMARD and methotrexate, will recommend restarting about 2 weeks postoperatively. Continue prednisone   Impediments to Fracture Healing: 1. Long  term corticosteroid use and RA/Lupus history  Dispo: PT/OT, SNF  Follow - up plan: 2 weeks  Shona Needles, MD [facility] [phone] (phone)

## 2018-05-18 NOTE — Progress Notes (Signed)
Patient Demographics:    Cindy Holmes, is a 67 y.o. female, DOB - 1951-06-11, WUJ:[id number]  Admit date - 05/13/2018   Admitting Physician Etta Quill, DO Outpatient Primary MD for the patient is Redmond School, MD  LOS - 4  Chief Complaint  Patient presents with  . Fall        Subjective:    [name] today has no fevers, no emesis,  No chest pain,  leg pain not well controlled, pain control remains challenging, PCA pump discontinued on 05/18/2018, possible discharge to SNF on 05/19/2018 if able to control pain with just oral agents without IV narcotics.... No BM since admission, give laxatives/Dulcolax suppository   Assessment  & Plan :    Principal Problem:   Multiple fractures Active Problems:   Essential hypertension   Rheumatoid arthritis (HCC)   Coronary artery disease involving native coronary artery of native heart without angina pectoris   Chronic diastolic CHF (congestive heart failure) (HCC)   Fracture of distal end of right radius   Tibial plateau fracture, right, closed, initial encounter   Fall at home, initial encounter  Brief Summary  67 y.o. female with medical history significant of RA, CAD s/p stents, chronic diastolic CHF, HTN, TIA admitted on 05/14/2018 after sustaining a fall (transferred from [facility] to [place]), imaging studies revealed  R distal radius fx, R tibial plateu fractures, R ankle Trimalleolar fractures, d/c to SNF if pain is controlled on oral agents only   Plan:- 1)S/p Fall with multiple injuries injuries:  A). Right lateral tibial plateau fracture s/p ORIF on 05/15/18 B). Right trimalleolar ankle fracture s/p ORIF on 05/15/18 C) Right distal radius fracture s/p ORIF on 05/15/18                   Weightbearing: NWB RLE, WBAT RUE thru elbow                  Insicional and dressing care: Keep splints clean dry and intact          Orthopedic device(s):Right wrist brace  pain control remains challenging, PCA pump discontinued on 05/18/2018, possible discharge to SNF on 05/19/2018 if able to control pain with just oral agents without IV narcotics   2) acute on chronic anemia--at baseline patient has normocytic normochromic anemia of chronic disease in the setting of chronic rheumatoid arthritis, now appears to have acute blood loss anemia secondary to fractures and operative repair, baseline hemoglobin around 11, hemoglobin admission was 11.2, hemoglobin now is down to 7.9 (stable x24 hours), check stool for occult blood, currently tolerating anemia well, consider transfusion of packed cells especially given the patient has CAD which we will try and keep hemoglobin close to 9  3)HFpEF--- patient with history of chronic CHF, no acute exacerbation at this time, continue cardiac meds  as below #5  4)RA----orthopedic team is requesting that we will continue to hold DMARD and methotrexate through 06/01/2018, however okay to continue prednisone   5)h/o CAD--- status post prior coronary stent, stable, no ACS symptoms, continue aspirin, Plavix metoprolol and ramipril  6)Presumed osteoporosis----patient with chronic steroid use now status post fall with multiple fractures, pharmacy consult regarding biphosphonate counseling, plan is to start Fosamax as  outpatient   Disposition/Need for in-Hospital Stay- patient unable to be discharged at this time due to pain control remains challenging, PCA pump discontinued on 05/18/2018, possible discharge to SNF on 05/19/2018 if able to control pain with just oral agents without IV narcotics  Code Status : Full  Disposition Plan  : SNF when pain is controlled with oral agents   Consults  :  ortho  DVT Prophylaxis  :  Lovenox -  Lab Results  Component Value Date   PLT 215 05/18/2018    Inpatient Medications  Scheduled Meds: . allopurinol  400 mg Oral Daily  . aspirin  81 mg Oral  Daily  . atorvastatin  80 mg Oral q1800  . bisacodyl  10 mg Rectal Once  . clopidogrel  75 mg Oral Daily  . enoxaparin (LOVENOX) injection  40 mg Subcutaneous Q24H  . ferrous sulfate  325 mg Oral TID WC  . folic acid  1 mg Oral Daily  . furosemide  40 mg Oral Daily  . gabapentin  300 mg Oral TID  . ketorolac  15 mg Intravenous Q6H  . loratadine  10 mg Oral Daily  . metoprolol tartrate  2.5 mg Intravenous Q12H  . pantoprazole  80 mg Oral Q1200  . predniSONE  20 mg Oral Q breakfast  . ramipril  2.5 mg Oral Daily  . senna-docusate  2 tablet Oral BID   Continuous Infusions: . sodium chloride 10 mL/hr at 05/14/18 0800  . lactated ringers 10 mL/hr at 05/15/18 2352   PRN Meds:.sodium chloride, acetaminophen **OR** acetaminophen, alum & mag hydroxide-simeth, HYDROmorphone (DILAUDID) injection, HYDROmorphone, ondansetron **OR** ondansetron (ZOFRAN) IV    Anti-infectives (From admission, onward)   Start     Dose/Rate Route Frequency Ordered Stop   05/15/18 2330  ceFAZolin (ANCEF) IVPB 2g/100 mL premix     2 g 200 mL/hr over 30 Minutes Intravenous Every 8 hours 05/15/18 2210 05/16/18 1427   05/15/18 1915  vancomycin (VANCOCIN) powder  Status:  Discontinued       As needed 05/15/18 1915 05/15/18 1924   05/15/18 1545  ceFAZolin (ANCEF) IVPB 2g/100 mL premix     2 g 200 mL/hr over 30 Minutes Intravenous On call to O.R. 05/15/18 1346 05/15/18 1535   05/15/18 1349  ceFAZolin (ANCEF) 2-4 GM/100ML-% IVPB    Note to Pharmacy:  Henrine Screws   : cabinet override      05/15/18 1349 05/15/18 1535   05/14/18 1000  hydroxychloroquine (PLAQUENIL) tablet 300 mg  Status:  Discontinued     300 mg Oral Daily 05/14/18 0349 05/16/18 1548       Objective:   Vitals:   05/18/18 0352 05/18/18 0355 05/18/18 0852 05/18/18 0940  BP: 128/62  (!) 147/57 127/67  Pulse: 78  79 72  Resp: 20 15 15    Temp: 98.9 F (37.2 C)  98.5 F (36.9 C)   TempSrc: Oral  Oral   SpO2: 100% 100% 100%   Weight:      Height:         Wt Readings from Last 3 Encounters:  05/14/18 100.3 kg  02/21/18 93.9 kg  01/11/17 105 kg    Intake/Output Summary (Last 24 hours) at 05/18/2018 1853 Last data filed at 05/18/2018 1334 Gross per 24 hour  Intake 240 ml  Output 1300 ml  Net -1060 ml   Physical Exam Patient is examined daily including today on 05/18/18 , exams remain the same as of yesterday except that has changed  Gen:- Awake Alert,  In no apparent distress  HEENT:- [place].AT, No sclera icterus Neck-Supple Neck,No JVD,.  Lungs-  CTAB , good air movement CV- S1, S2 normal, regular Abd-  +ve B.Sounds, Abd Soft, No tenderness,    Extremity/Skin:- No  edema, right upper extremity and right lower extremity in splints Psych-affect is appropriate, oriented x3 Neuro-no new focal deficits, no tremors   Data Review:   Micro Results Recent Results (from the past 240 hour(s))  MRSA PCR Screening     Status: None   Collection Time: 05/14/18  9:50 PM  Result Value Ref Range Status   MRSA by PCR NEGATIVE NEGATIVE Final    Comment:        The GeneXpert MRSA Assay (FDA approved for NASAL specimens only), is one component of a comprehensive MRSA colonization surveillance program. It is not intended to diagnose MRSA infection nor to guide or monitor treatment for MRSA infections. Performed at [facility], [place] [address] [place], [place] [address]     Radiology Reports Dg Chest 2 View  Result Date: 05/14/2018 CLINICAL DATA:  67 year old female with fall. History of CHF. EXAM: CHEST - 2 VIEW COMPARISON:  Chest CT dated 05/14/2018 FINDINGS: There is cardiomegaly with probable mild vascular congestion. No focal consolidation, pleural effusion, or pneumothorax. No acute osseous pathology. IMPRESSION: Cardiomegaly with probable mild vascular congestion. No focal consolidation. Electronically Signed   By: Anner Crete M.D.   On: 05/14/2018 01:10   Dg Pelvis 1-2 Views  Result Date: 05/14/2018 CLINICAL DATA:   Fall EXAM: PELVIS - 1-2 VIEW COMPARISON:  None. FINDINGS: There is no evidence of pelvic fracture or diastasis. No pelvic bone lesions are seen. IMPRESSION: Negative. Electronically Signed   By: Kerby Moors M.D.   On: 05/14/2018 01:10   Dg Forearm Right  Result Date: 05/14/2018 CLINICAL DATA:  Fall.  Right wrist pain. EXAM: RIGHT FOREARM - 2 VIEW COMPARISON:  None. FINDINGS: There is a an acute intra-articular fracture deformity involving the distal radius. Dorsal angulation of the distal fracture fragments noted. No dislocation identified. IMPRESSION: 1. Acute fracture involves the dorsal aspect of the distal radius. Mild dorsal angulation of the distal fracture fragments. Electronically Signed   By: Kerby Moors M.D.   On: 05/14/2018 01:07   Dg Wrist Complete Left  Result Date: 05/14/2018 CLINICAL DATA:  67 year old female with fall and left wrist pain. EXAM: LEFT WRIST - COMPLETE 3+ VIEW COMPARISON:  None. FINDINGS: There is no acute fracture or dislocation. The bones are osteopenic. No significant arthritic changes. There is soft tissue swelling over the wrist and ulna. No radiopaque foreign object or soft tissue gas. IMPRESSION: No acute fracture or dislocation. Electronically Signed   By: Anner Crete M.D.   On: 05/14/2018 00:13   Dg Wrist Complete Right  Result Date: 05/15/2018 CLINICAL DATA:  Postoperative right wrist EXAM: RIGHT WRIST - COMPLETE 3+ VIEW COMPARISON:  Intraoperative fluoroscopy 05/15/2018 FINDINGS: Postoperative plate and screw fixation of the distal right radial metaphysis. Are where components appear intact and well seated. Near-anatomic alignment of the fracture fragments. Degenerative changes in the radiocarpal and STT joints. Overlying splint material obscures some bone detail. IMPRESSION: Postoperative plate and screw fixation of the distal right radial metaphysis without apparent complication. Electronically Signed   By: Lucienne Capers M.D.   On: 05/15/2018  22:09   Dg Wrist Complete Right  Result Date: 05/15/2018 CLINICAL DATA:  67 year old female with right distal radial fracture. Subsequent encounter. EXAM: DG C-ARM  61-120 MIN; RIGHT WRIST - COMPLETE 3+ VIEW Fluoroscopic time: 49.7 seconds. COMPARISON:  05/14/2018. FINDINGS: Five intraoperative C-arm views submitted for review after surgery. Distal right radial fracture reduced utilizing plate and screws. Much better alignment of fracture fragments with minimal separation/incongruity of articular surface. IMPRESSION: Open reduction and internal fixation distal right radius fracture. Electronically Signed   By: Genia Del M.D.   On: 05/15/2018 20:07   Dg Wrist Complete Right  Result Date: 05/14/2018 CLINICAL DATA:  Pain after fall EXAM: RIGHT WRIST - COMPLETE 3+ VIEW COMPARISON:  None. FINDINGS: There is a comminuted mildly displaced fracture through the distal radial metaphysis. IMPRESSION: Comminuted mildly displaced fracture through the distal radial metaphysis. Electronically Signed   By: Dorise Bullion III M.D   On: 05/14/2018 00:12   Dg Knee 2 Views Right  Result Date: 05/14/2018 CLINICAL DATA:  Pain after fall EXAM: RIGHT KNEE - 1-2 VIEW COMPARISON:  None. FINDINGS: A hemarthrosis is identified. There is a depressed lateral tibial plateau fracture which is comminuted. IMPRESSION: Comminuted mildly depressed lateral tibial plateau fracture with resulting hemarthrosis. Electronically Signed   By: Dorise Bullion III M.D   On: 05/14/2018 00:08   Dg Tibia/fibula Right  Result Date: 05/15/2018 CLINICAL DATA:  67 year old female with tibial plateau fracture and right ankle fracture. Subsequent encounter. EXAM: RIGHT TIBIA AND FIBULA - 2 VIEW; DG C-ARM 61-120 MIN Fluoroscopic time: 3 minutes and 8 seconds. COMPARISON:  05/14/2018 CT. FINDINGS: Eleven intraoperative film submitted for review after surgery. Six intraoperative C-arm views of the right knee: Reduction of lateral tibial plateau fracture  with sideplate and screws. Articular surface with better alignment although with slight depression. Five intraoperative C-arm views of the right ankle: Reduction of fibular fracture with sideplate and screws. Screws transfix the distal fibula-tibia joint space. Reduction of medial malleolar fracture with screw. Evaluation of posterior tibial fracture limited secondary to overlying plate. IMPRESSION: Open reduction and internal fixation right knee and right ankle fractures as noted above. Electronically Signed   By: Genia Del M.D.   On: 05/15/2018 17:57   Dg Tibia/fibula Right  Result Date: 05/14/2018 CLINICAL DATA:  Pain after fall EXAM: RIGHT TIBIA AND FIBULA - 2 VIEW COMPARISON:  None. FINDINGS: There is a comminuted depressed fracture through the lateral tibial plateau. There is a comminuted fracture through the distal fibular diaphysis. There is a lucency through the distal medial malleolus with a step-off. These hemarthrosis in the suprapatellar fossa. IMPRESSION: 1. Depressed comminuted lateral tibial plateau fracture. 2. Comminuted fracture through the distal fibular diaphysis. 3. Fracture through the distal medial malleolus. Electronically Signed   By: Dorise Bullion III M.D   On: 05/14/2018 00:14   Dg Ankle Complete Right  Result Date: 05/14/2018 CLINICAL DATA:  Pain after fall EXAM: RIGHT ANKLE - COMPLETE 3+ VIEW COMPARISON:  None. FINDINGS: There is a comminuted fracture through the distal fibular diaphysis. There is a fracture through the medial malleolus. There also be appears to be a fracture through the distal tibia where it abuts the fibula. Irregularity of the distal most fibula is consistent with an age indeterminate fracture but may not be acute. Soft tissue swelling. IMPRESSION: 1. Fracture through the medial malleolus. 2. Apparent fracture through the lateral aspect of the distal tibia with displacement. 3. Fracture through the fibular diaphysis distally. 4. Irregularity of the  distal tip of the fibula may be nonacute from previous injury. 5. No definitive posterior malleolar fracture. However, given the complex nature of the ankle fracture,  consider a CT scan for better evaluation. Electronically Signed   By: Dorise Bullion III M.D   On: 05/14/2018 00:17   Ct Head Wo Contrast  Result Date: 05/13/2018 CLINICAL DATA:  67 year old female with facial trauma. EXAM: CT HEAD WITHOUT CONTRAST CT MAXILLOFACIAL WITHOUT CONTRAST CT CERVICAL SPINE WITHOUT CONTRAST TECHNIQUE: Multidetector CT imaging of the head, cervical spine, and maxillofacial structures were performed using the standard protocol without intravenous contrast. Multiplanar CT image reconstructions of the cervical spine and maxillofacial structures were also generated. COMPARISON:  Head CT dated 07/23/2007 FINDINGS: CT HEAD FINDINGS Brain: The ventricles and sulci appropriate size for patient's age. The gray-white matter discrimination is preserved. There is no acute intracranial hemorrhage. No mass effect or midline shift. No extra-axial fluid collection. Vascular: No hyperdense vessel or unexpected calcification. Skull: Normal. Negative for fracture or focal lesion. Other: None. CT MAXILLOFACIAL FINDINGS Osseous: No fracture or mandibular dislocation. No destructive process. Orbits: Negative. No traumatic or inflammatory finding. Sinuses: Clear. Soft tissues: Negative. CT CERVICAL SPINE FINDINGS Alignment: No acute subluxation. Straightening of normal cervical lordosis which may be positional or due to muscle spasm. Skull base and vertebrae: No acute fracture. Soft tissues and spinal canal: No prevertebral fluid or swelling. No visible canal hematoma. Disc levels:  Degenerative changes at C5-C6. Upper chest: Negative. Other: None IMPRESSION: 1. No acute intracranial pathology. 2. No acute facial bone fractures. 3. No acute/traumatic cervical spine pathology. Electronically Signed   By: Anner Crete M.D.   On: 05/13/2018  23:40   Ct Chest W Contrast  Result Date: 05/14/2018 CLINICAL DATA:  67 year old female with trauma. EXAM: CT CHEST, ABDOMEN, AND PELVIS WITH CONTRAST TECHNIQUE: Multidetector CT imaging of the chest, abdomen and pelvis was performed following the standard protocol during bolus administration of intravenous contrast. CONTRAST:  148mL ISOVUE-300 IOPAMIDOL (ISOVUE-300) INJECTION 61% COMPARISON:  CT of the abdomen pelvis dated 12/02/2016 FINDINGS: CT CHEST FINDINGS Cardiovascular: There is mild cardiomegaly. No pericardial effusion. Multi vessel coronary vascular calcification. Mild atherosclerotic calcification of the thoracic aorta. No aneurysmal dilatation or dissection. The visualized origins of the great vessels of the aortic arch are patent. The central pulmonary arteries are grossly unremarkable. Mediastinum/Nodes: No hilar or mediastinal adenopathy. Esophagus and thyroid gland are grossly unremarkable. No mediastinal fluid collection. Lungs/Pleura: Bibasilar linear atelectasis/scarring. The lungs are clear. There is no pleural effusion or pneumothorax. The central airways are patent. Musculoskeletal: No chest wall mass or suspicious bone lesions identified. CT ABDOMEN PELVIS FINDINGS No intra-abdominal free air or free fluid. Hepatobiliary: The liver is unremarkable. Cholecystectomy. Mild intrahepatic biliary ductal dilatation. Pancreas: Unremarkable. No pancreatic ductal dilatation or surrounding inflammatory changes. Spleen: Normal in size without focal abnormality. Adrenals/Urinary Tract: The adrenal glands are unremarkable. Small nonobstructing right renal inferior pole hypodense focus which is too small to characterize. There is no hydronephrosis on either side. There is symmetric enhancement and excretion of contrast by both kidneys. The visualized ureters and urinary bladder appear unremarkable. Stomach/Bowel: There is sigmoid diverticulosis without active inflammatory changes. A gastric lap band is  noted. There is no bowel obstruction or active inflammation. The appendix is not visualized with certainty. No inflammatory changes identified in the right lower quadrant. Vascular/Lymphatic: Moderate aortoiliac atherosclerotic disease. No portal venous gas. There is no adenopathy. Reproductive: The uterus is anteverted and grossly unremarkable. Other: None Musculoskeletal: Grade 1 L4-L5 anterolisthesis. No acute osseous pathology. IMPRESSION: No acute intrathoracic, abdominal, or pelvic pathology. Electronically Signed   By: Anner Crete M.D.   On: 05/14/2018 02:42  Ct Cervical Spine Wo Contrast  Result Date: 05/13/2018 CLINICAL DATA:  67 year old female with facial trauma. EXAM: CT HEAD WITHOUT CONTRAST CT MAXILLOFACIAL WITHOUT CONTRAST CT CERVICAL SPINE WITHOUT CONTRAST TECHNIQUE: Multidetector CT imaging of the head, cervical spine, and maxillofacial structures were performed using the standard protocol without intravenous contrast. Multiplanar CT image reconstructions of the cervical spine and maxillofacial structures were also generated. COMPARISON:  Head CT dated 07/23/2007 FINDINGS: CT HEAD FINDINGS Brain: The ventricles and sulci appropriate size for patient's age. The gray-white matter discrimination is preserved. There is no acute intracranial hemorrhage. No mass effect or midline shift. No extra-axial fluid collection. Vascular: No hyperdense vessel or unexpected calcification. Skull: Normal. Negative for fracture or focal lesion. Other: None. CT MAXILLOFACIAL FINDINGS Osseous: No fracture or mandibular dislocation. No destructive process. Orbits: Negative. No traumatic or inflammatory finding. Sinuses: Clear. Soft tissues: Negative. CT CERVICAL SPINE FINDINGS Alignment: No acute subluxation. Straightening of normal cervical lordosis which may be positional or due to muscle spasm. Skull base and vertebrae: No acute fracture. Soft tissues and spinal canal: No prevertebral fluid or swelling. No  visible canal hematoma. Disc levels:  Degenerative changes at C5-C6. Upper chest: Negative. Other: None IMPRESSION: 1. No acute intracranial pathology. 2. No acute facial bone fractures. 3. No acute/traumatic cervical spine pathology. Electronically Signed   By: Anner Crete M.D.   On: 05/13/2018 23:40   Ct Knee Right Wo Contrast  Result Date: 05/14/2018 CLINICAL DATA:  67 year old female with fall and right knee pain. EXAM: CT OF THE right KNEE WITHOUT CONTRAST TECHNIQUE: Multidetector CT imaging of the right knee was performed according to the standard protocol. Multiplanar CT image reconstructions were also generated. COMPARISON:  Earlier radiograph dated 05/13/2018 FINDINGS: Bones/Joint/Cartilage There is a comminuted intra-articular fracture of the proximal tibia with mild depression of the lateral tibial plateau. The bones are osteopenic. There is no dislocation. There is a large suprapatellar lipohemarthrosis. Ligaments Suboptimally assessed by CT. Muscles and Tendons No acute findings. No intramuscular hematoma. Soft tissues There is diffuse subcutaneous stranding of the lateral knee. No fluid collection. IMPRESSION: 1. Comminuted intra-articular fracture of the proximal tibia with mild depression of the lateral tibial plateau. 2. Large suprapatellar lipohemarthrosis. Electronically Signed   By: Anner Crete M.D.   On: 05/14/2018 02:33   Ct Abdomen Pelvis W Contrast  Result Date: 05/14/2018 CLINICAL DATA:  67 year old female with trauma. EXAM: CT CHEST, ABDOMEN, AND PELVIS WITH CONTRAST TECHNIQUE: Multidetector CT imaging of the chest, abdomen and pelvis was performed following the standard protocol during bolus administration of intravenous contrast. CONTRAST:  147mL ISOVUE-300 IOPAMIDOL (ISOVUE-300) INJECTION 61% COMPARISON:  CT of the abdomen pelvis dated 12/02/2016 FINDINGS: CT CHEST FINDINGS Cardiovascular: There is mild cardiomegaly. No pericardial effusion. Multi vessel coronary  vascular calcification. Mild atherosclerotic calcification of the thoracic aorta. No aneurysmal dilatation or dissection. The visualized origins of the great vessels of the aortic arch are patent. The central pulmonary arteries are grossly unremarkable. Mediastinum/Nodes: No hilar or mediastinal adenopathy. Esophagus and thyroid gland are grossly unremarkable. No mediastinal fluid collection. Lungs/Pleura: Bibasilar linear atelectasis/scarring. The lungs are clear. There is no pleural effusion or pneumothorax. The central airways are patent. Musculoskeletal: No chest wall mass or suspicious bone lesions identified. CT ABDOMEN PELVIS FINDINGS No intra-abdominal free air or free fluid. Hepatobiliary: The liver is unremarkable. Cholecystectomy. Mild intrahepatic biliary ductal dilatation. Pancreas: Unremarkable. No pancreatic ductal dilatation or surrounding inflammatory changes. Spleen: Normal in size without focal abnormality. Adrenals/Urinary Tract: The adrenal glands are unremarkable.  Small nonobstructing right renal inferior pole hypodense focus which is too small to characterize. There is no hydronephrosis on either side. There is symmetric enhancement and excretion of contrast by both kidneys. The visualized ureters and urinary bladder appear unremarkable. Stomach/Bowel: There is sigmoid diverticulosis without active inflammatory changes. A gastric lap band is noted. There is no bowel obstruction or active inflammation. The appendix is not visualized with certainty. No inflammatory changes identified in the right lower quadrant. Vascular/Lymphatic: Moderate aortoiliac atherosclerotic disease. No portal venous gas. There is no adenopathy. Reproductive: The uterus is anteverted and grossly unremarkable. Other: None Musculoskeletal: Grade 1 L4-L5 anterolisthesis. No acute osseous pathology. IMPRESSION: No acute intrathoracic, abdominal, or pelvic pathology. Electronically Signed   By: Anner Crete M.D.   On:  05/14/2018 02:42   Ct Ankle Right Wo Contrast  Result Date: 05/14/2018 CLINICAL DATA:  67 year old female with fall and right ankle pain. EXAM: CT OF THE RIGHT ANKLE WITHOUT CONTRAST TECHNIQUE: Multidetector CT imaging of the right ankle was performed according to the standard protocol. Multiplanar CT image reconstructions were also generated. COMPARISON:  Right ankle radiograph dated 05/13/2018 FINDINGS: Bones/Joint/Cartilage There is a comminuted fracture of the distal fibula with extension of the fracture into the distal tibia-fibular syndesmosis. There is a minimally displaced fracture of the lateral aspect of the tibial plafond and nondisplaced fracture of the posterior malleolus. There is a minimally displaced fracture of the medial malleolus. There bones are osteopenic. Discontinuity of the plantar cortex of the medial cuneiform (series 5, image 58 and sagittal series 6, image 89) concerning for an avulsion fracture. There is also linear lucency through the plantar base of the first metatarsal (series 6, image 90) also concerning for an avulsion corner fracture. Probable nondisplaced fracture of the dorsal aspect of the medial cuneiform (series 4, image 146 and sagittal series 6, image 69). Fracture of the base of the second metatarsal (series 6, image 73 and series 4, image 161). Nondisplaced linear lucency through the proximal fourth metatarsal (series 6, image 58) may be artifactual or represent a vascular groove or a nondisplaced fracture. No dislocation. Diffuse soft tissue edema. Ligaments Suboptimally assessed by CT. Muscles and Tendons No acute intramuscular findings. Soft tissues Diffuse subcutaneous edema. IMPRESSION: 1. Comminuted fracture of the distal fibula with extension of the fracture into the distal tibia-fibular syndesmosis. 2. Minimally displaced fracture of the lateral aspect of the tibial plafond and nondisplaced fracture of the posterior malleolus. 3. Minimally displaced fracture of  the medial malleolus. 4. Nondisplaced fracture of the base of the second metatarsal. 5. Linear lucency through the plantar cortex of the medial cuneiform concerning for an avulsion fracture. 6. Linear lucency through the proximal fourth metatarsal may be artifactual or represent a nondisplaced fracture. Electronically Signed   By: Anner Crete M.D.   On: 05/14/2018 02:55   Dg Shoulder Left  Result Date: 05/14/2018 CLINICAL DATA:  General left shoulder pain.  Pain after fall. EXAM: LEFT SHOULDER - 2+ VIEW COMPARISON:  None. FINDINGS: There is no evidence of fracture or dislocation. There is no evidence of arthropathy or other focal bone abnormality. Soft tissues are unremarkable. IMPRESSION: Negative. Electronically Signed   By: Dorise Bullion III M.D   On: 05/14/2018 00:07   Dg Knee Right Port  Result Date: 05/15/2018 CLINICAL DATA:  Postoperative right knee EXAM: PORTABLE RIGHT KNEE - 1-2 VIEW COMPARISON:  Intraoperative fluoroscopy 05/15/2018 FINDINGS: Postoperative changes with lateral plate and screw fixation of the proximal tibial metaphysis and lateral tibial plateau.  Hardware components appear intact and well seated. Degenerative changes in the right knee. Soft tissue gas consistent with recent surgery. IMPRESSION: Postoperative internal fixation of lateral tibial plateau fracture. Electronically Signed   By: Lucienne Capers M.D.   On: 05/15/2018 22:06   Dg Ankle Right Port  Result Date: 05/15/2018 CLINICAL DATA:  Postoperative right ankle EXAM: PORTABLE RIGHT ANKLE - 2 VIEW COMPARISON:  Intraoperative fluoroscopy 05/15/2018 FINDINGS: Postoperative changes with plate and screw fixation of a fracture of the distal fibular shaft and screw fixation of the medial malleolar fracture fragment. Hardware components appear intact and well seated. Near-anatomic alignment of the fracture fragments. Overlying splint material obscures bone detail. IMPRESSION: Postoperative internal fixation of fractures  of the right distal fibula and medial malleolus. Electronically Signed   By: Lucienne Capers M.D.   On: 05/15/2018 22:08   Dg Hand Complete Right  Result Date: 05/14/2018 CLINICAL DATA:  67 year old female with fall and trauma to the right hand. EXAM: RIGHT HAND - COMPLETE 3+ VIEW COMPARISON:  Right wrist radiograph dated 05/13/2018 FINDINGS: Comminuted mildly displaced intra-articular fracture of the distal radius as seen on the earlier wrist radiograph. No other acute fracture noted. The bones are osteopenic. No dislocation. Soft tissue swelling of the wrist. IMPRESSION: Comminuted mildly displaced intra-articular fracture of the distal radius. Electronically Signed   By: Anner Crete M.D.   On: 05/14/2018 01:09   Dg C-arm 1-60 Min  Result Date: 05/15/2018 CLINICAL DATA:  67 year old female with right distal radial fracture. Subsequent encounter. EXAM: DG C-ARM 61-120 MIN; RIGHT WRIST - COMPLETE 3+ VIEW Fluoroscopic time: 49.7 seconds. COMPARISON:  05/14/2018. FINDINGS: Five intraoperative C-arm views submitted for review after surgery. Distal right radial fracture reduced utilizing plate and screws. Much better alignment of fracture fragments with minimal separation/incongruity of articular surface. IMPRESSION: Open reduction and internal fixation distal right radius fracture. Electronically Signed   By: Genia Del M.D.   On: 05/15/2018 20:07   Dg C-arm 1-60 Min  Result Date: 05/15/2018 CLINICAL DATA:  67 year old female with tibial plateau fracture and right ankle fracture. Subsequent encounter. EXAM: RIGHT TIBIA AND FIBULA - 2 VIEW; DG C-ARM 61-120 MIN Fluoroscopic time: 3 minutes and 8 seconds. COMPARISON:  05/14/2018 CT. FINDINGS: Eleven intraoperative film submitted for review after surgery. Six intraoperative C-arm views of the right knee: Reduction of lateral tibial plateau fracture with sideplate and screws. Articular surface with better alignment although with slight depression. Five  intraoperative C-arm views of the right ankle: Reduction of fibular fracture with sideplate and screws. Screws transfix the distal fibula-tibia joint space. Reduction of medial malleolar fracture with screw. Evaluation of posterior tibial fracture limited secondary to overlying plate. IMPRESSION: Open reduction and internal fixation right knee and right ankle fractures as noted above. Electronically Signed   By: Genia Del M.D.   On: 05/15/2018 17:57   Dg C-arm 1-60 Min  Result Date: 05/15/2018 CLINICAL DATA:  67 year old female with tibial plateau fracture and right ankle fracture. Subsequent encounter. EXAM: RIGHT TIBIA AND FIBULA - 2 VIEW; DG C-ARM 61-120 MIN Fluoroscopic time: 3 minutes and 8 seconds. COMPARISON:  05/14/2018 CT. FINDINGS: Eleven intraoperative film submitted for review after surgery. Six intraoperative C-arm views of the right knee: Reduction of lateral tibial plateau fracture with sideplate and screws. Articular surface with better alignment although with slight depression. Five intraoperative C-arm views of the right ankle: Reduction of fibular fracture with sideplate and screws. Screws transfix the distal fibula-tibia joint space. Reduction of medial malleolar fracture  with screw. Evaluation of posterior tibial fracture limited secondary to overlying plate. IMPRESSION: Open reduction and internal fixation right knee and right ankle fractures as noted above. Electronically Signed   By: Genia Del M.D.   On: 05/15/2018 17:57   Ct Maxillofacial Wo Contrast  Result Date: 05/13/2018 CLINICAL DATA:  67 year old female with facial trauma. EXAM: CT HEAD WITHOUT CONTRAST CT MAXILLOFACIAL WITHOUT CONTRAST CT CERVICAL SPINE WITHOUT CONTRAST TECHNIQUE: Multidetector CT imaging of the head, cervical spine, and maxillofacial structures were performed using the standard protocol without intravenous contrast. Multiplanar CT image reconstructions of the cervical spine and maxillofacial  structures were also generated. COMPARISON:  Head CT dated 07/23/2007 FINDINGS: CT HEAD FINDINGS Brain: The ventricles and sulci appropriate size for patient's age. The gray-white matter discrimination is preserved. There is no acute intracranial hemorrhage. No mass effect or midline shift. No extra-axial fluid collection. Vascular: No hyperdense vessel or unexpected calcification. Skull: Normal. Negative for fracture or focal lesion. Other: None. CT MAXILLOFACIAL FINDINGS Osseous: No fracture or mandibular dislocation. No destructive process. Orbits: Negative. No traumatic or inflammatory finding. Sinuses: Clear. Soft tissues: Negative. CT CERVICAL SPINE FINDINGS Alignment: No acute subluxation. Straightening of normal cervical lordosis which may be positional or due to muscle spasm. Skull base and vertebrae: No acute fracture. Soft tissues and spinal canal: No prevertebral fluid or swelling. No visible canal hematoma. Disc levels:  Degenerative changes at C5-C6. Upper chest: Negative. Other: None IMPRESSION: 1. No acute intracranial pathology. 2. No acute facial bone fractures. 3. No acute/traumatic cervical spine pathology. Electronically Signed   By: Anner Crete M.D.   On: 05/13/2018 23:40     CBC Recent Labs  Lab 05/14/18 0050 05/15/18 0443 05/16/18 0344 05/17/18 0304 05/18/18 0231  WBC 18.7* 9.2 10.9* 8.8 8.7  HGB 11.2* 10.1* 9.1* 7.8* 7.9*  HCT 35.4* 32.4* 29.0* 24.8* 25.2*  PLT 317 196 224 194 215  MCV 94.7 95.6 95.4 93.2 93.7  MCH 29.9 29.8 29.9 29.3 29.4  MCHC 31.6 31.2 31.4 31.5 31.3  RDW 14.7 14.6 14.5 14.4 14.5  LYMPHSABS 1.6  --  0.4* 1.3 1.8  MONOABS 1.2*  --  0.5 0.7 0.7  EOSABS 0.1  --  0.0 0.0 0.1  BASOSABS 0.1  --  0.0 0.0 0.0    Chemistries  Recent Labs  Lab 05/14/18 0050 05/15/18 0443 05/16/18 0344 05/17/18 0304 05/18/18 0231  NA 135 134* 132* 134* 135  K 3.4* 4.5 4.4 3.7 4.1  CL 101 103 99 95* 98  CO2 25 24 26 31 28   GLUCOSE 142* 115* 161* 121* 115*    BUN 14 12 9 8 9   CREATININE 0.66 0.54 0.56 0.56 0.55  CALCIUM 8.8* 8.8* 8.3* 8.2* 8.3*  MG  --  2.1  --   --   --    ------------------------------------------------------------------------------------------------------------------ No results for input(s): CHOL, HDL, LDLCALC, TRIG, CHOLHDL, LDLDIRECT in the last 72 hours.  Lab Results  Component Value Date   HGBA1C 5.5 03/11/2016   ------------------------------------------------------------------------------------------------------------------ No results for input(s): TSH, T4TOTAL, T3FREE, THYROIDAB in the last 72 hours.  Invalid input(s): FREET3 ------------------------------------------------------------------------------------------------------------------ No results for input(s): VITAMINB12, FOLATE, FERRITIN, TIBC, IRON, RETICCTPCT in the last 72 hours.  Coagulation profile No results for input(s): INR, PROTIME in the last 168 hours.  No results for input(s): DDIMER in the last 72 hours.  Cardiac Enzymes No results for input(s): CKMB, TROPONINI, MYOGLOBIN in the last 168 hours.  Invalid input(s): CK ------------------------------------------------------------------------------------------------------------------    Component Value Date/Time  BNP 106.2 (H) 12/03/2016 8264     Roxan Hockey M.D on 05/18/2018 at 6:53 PM  Pager---[phone] Go to [email or web address] - password [email or web address] for contact info  [facility] - Office  [phone]

## 2018-05-19 LAB — CBC WITH DIFFERENTIAL/PLATELET
Abs Immature Granulocytes: 0.05 10*3/uL (ref 0.00–0.07)
BASOS PCT: 0 %
Basophils Absolute: 0 10*3/uL (ref 0.0–0.1)
EOS ABS: 0.1 10*3/uL (ref 0.0–0.5)
Eosinophils Relative: 2 %
HEMATOCRIT: 24.4 % — AB (ref 36.0–46.0)
Hemoglobin: 7.7 g/dL — ABNORMAL LOW (ref 12.0–15.0)
IMMATURE GRANULOCYTES: 1 %
LYMPHS ABS: 1.6 10*3/uL (ref 0.7–4.0)
Lymphocytes Relative: 18 %
MCH: 29.4 pg (ref 26.0–34.0)
MCHC: 31.6 g/dL (ref 30.0–36.0)
MCV: 93.1 fL (ref 80.0–100.0)
MONO ABS: 0.6 10*3/uL (ref 0.1–1.0)
MONOS PCT: 7 %
NEUTROS PCT: 72 %
Neutro Abs: 6.2 10*3/uL (ref 1.7–7.7)
PLATELETS: 258 10*3/uL (ref 150–400)
RBC: 2.62 MIL/uL — ABNORMAL LOW (ref 3.87–5.11)
RDW: 14.3 % (ref 11.5–15.5)
WBC: 8.6 10*3/uL (ref 4.0–10.5)
nRBC: 0 % (ref 0.0–0.2)

## 2018-05-19 LAB — BASIC METABOLIC PANEL
ANION GAP: 8 (ref 5–15)
BUN: 15 mg/dL (ref 8–23)
CALCIUM: 8.3 mg/dL — AB (ref 8.9–10.3)
CO2: 29 mmol/L (ref 22–32)
Chloride: 96 mmol/L — ABNORMAL LOW (ref 98–111)
Creatinine, Ser: 0.57 mg/dL (ref 0.44–1.00)
GFR calc Af Amer: >60 mL/min (ref >60)
GFR calc non Af Amer: >60 mL/min (ref >60)
GLUCOSE: 113 mg/dL — AB (ref 70–99)
POTASSIUM: 3.9 mmol/L (ref 3.5–5.1)
Sodium: 133 mmol/L — ABNORMAL LOW (ref 135–145)

## 2018-05-19 LAB — OCCULT BLOOD X 1 CARD TO LAB, STOOL: FECAL OCCULT BLD: NEGATIVE

## 2018-05-19 MED ORDER — KETOROLAC TROMETHAMINE 15 MG/ML IJ SOLN
15.0000 mg | Freq: Four times a day (QID) | INTRAMUSCULAR | Status: DC
  Administered 2018-05-19 – 2018-05-20 (×5): 15 mg via INTRAVENOUS
  Filled 2018-05-19 (×6): qty 1

## 2018-05-19 MED ORDER — ACETAMINOPHEN 325 MG PO TABS
650.0000 mg | ORAL_TABLET | Freq: Four times a day (QID) | ORAL | Status: DC
  Administered 2018-05-19 – 2018-05-22 (×11): 650 mg via ORAL
  Filled 2018-05-19 (×12): qty 2

## 2018-05-19 MED ORDER — ACETAMINOPHEN 650 MG RE SUPP: 650.0000 mg | Freq: Four times a day (QID) | RECTAL | Status: DC

## 2018-05-19 NOTE — Discharge Instructions (Signed)
Orthopaedic Trauma Service Discharge Instructions   General Discharge Instructions  WEIGHT BEARING STATUS: Nonweightbearing right lower extremity, Nonweightbearing through wrist. Okay to weight bearing through elbow  RANGE OF MOTION/ACTIVITY:No restrictions on range of motion of knee and ankle. Hinged knee brace locked in extension at night and unlocked during the day to work on range of motion.   Wound Care:See below  DVT/PE prophylaxis:Lovenox daily  Diet: as you were eating previously.  Can use over the counter stool softeners and bowel preparations, such as Miralax, to help with bowel movements.  Narcotics can be constipating.  Be sure to drink plenty of fluids  PAIN MEDICATION USE AND EXPECTATIONS  You have likely been given narcotic medications to help control your pain.  After a traumatic event that results in an fracture (broken bone) with or without surgery, it is ok to use narcotic pain medications to help control one's pain.  We understand that everyone responds to pain differently and each individual patient will be evaluated on a regular basis for the continued need for narcotic medications. Ideally, narcotic medication use should last no more than 6-8 weeks (coinciding with fracture healing).   As a patient it is your responsibility as well to monitor narcotic medication use and report the amount and frequency you use these medications when you come to your office visit.   We would also advise that if you are using narcotic medications, you should take a dose prior to therapy to maximize you participation.  IF YOU ARE ON NARCOTIC MEDICATIONS IT IS NOT PERMISSIBLE TO OPERATE A MOTOR VEHICLE (MOTORCYCLE/CAR/TRUCK/MOPED) OR HEAVY MACHINERY DO NOT MIX NARCOTICS WITH OTHER CNS (CENTRAL NERVOUS SYSTEM) DEPRESSANTS SUCH AS ALCOHOL   STOP SMOKING OR USING NICOTINE PRODUCTS!!!!  As discussed nicotine severely impairs your body's ability to heal surgical and traumatic wounds but also  impairs bone healing.  Wounds and bone heal by forming microscopic blood vessels (angiogenesis) and nicotine is a vasoconstrictor (essentially, shrinks blood vessels).  Therefore, if vasoconstriction occurs to these microscopic blood vessels they essentially disappear and are unable to deliver necessary nutrients to the healing tissue.  This is one modifiable factor that you can do to dramatically increase your chances of healing your injury.    (This means no smoking, no nicotine gum, patches, etc)  DO NOT USE NONSTEROIDAL ANTI-INFLAMMATORY DRUGS (NSAID'S)  Using products such as Advil (ibuprofen), Aleve (naproxen), Motrin (ibuprofen) for additional pain control during fracture healing can delay and/or prevent the healing response.  If you would like to take over the counter (OTC) medication, Tylenol (acetaminophen) is ok.  However, some narcotic medications that are given for pain control contain acetaminophen as well. Therefore, you should not exceed more than 4000 mg of tylenol in a day if you do not have liver disease.  Also note that there are may OTC medicines, such as cold medicines and allergy medicines that my contain tylenol as well.  If you have any questions about medications and/or interactions please ask your doctor/PA or your pharmacist.      ICE AND ELEVATE INJURED/OPERATIVE EXTREMITY  Using ice and elevating the injured extremity above your heart can help with swelling and pain control.  Icing in a pulsatile fashion, such as 20 minutes on and 20 minutes off, can be followed.    Do not place ice directly on skin. Make sure there is a barrier between to skin and the ice pack.    Using frozen items such as frozen peas works well as the  conform nicely to the are that needs to be iced.  USE AN ACE WRAP OR TED HOSE FOR SWELLING CONTROL  In addition to icing and elevation, Ace wraps or TED hose are used to help limit and resolve swelling.  It is recommended to use Ace wraps or TED hose until  you are informed to stop.    When using Ace Wraps start the wrapping distally (farthest away from the body) and wrap proximally (closer to the body)   Example: If you had surgery on your leg or thing and you do not have a splint on, start the ace wrap at the toes and work your way up to the thigh        If you had surgery on your upper extremity and do not have a splint on, start the ace wrap at your fingers and work your way up to the upper arm  IF YOU ARE IN A SPLINT OR CAST DO NOT [place]   If your splint gets wet for any reason please contact the office immediately. You may shower in your splint or cast as long as you keep it dry.  This can be done by wrapping in a cast cover or garbage back (or similar)  Do Not stick any thing down your splint or cast such as pencils, money, or hangers to try and scratch yourself with.  If you feel itchy take benadryl as prescribed on the bottle for itching  IF YOU ARE IN A CAM BOOT (BLACK BOOT)  You may remove boot periodically. Perform daily dressing changes as noted below.  Wash the liner of the boot regularly and wear a sock when wearing the boot. It is recommended that you sleep in the boot until told otherwise  CALL THE OFFICE WITH ANY QUESTIONS OR CONCERNS: [phone]    Discharge Wound Care Instructions  Do NOT apply any ointments, solutions or lotions to pin sites or surgical wounds.  These prevent needed drainage and even though solutions like hydrogen peroxide kill bacteria, they also damage cells lining the pin sites that help fight infection.  Applying lotions or ointments can keep the wounds moist and can cause them to breakdown and open up as well. This can increase the risk for infection. When in doubt call the office.  Surgical incisions should be dressed daily.  If any drainage is noted, use one layer of adaptic, then gauze, Kerlix, and an ace wrap.  Once the incision is completely dry and without drainage, it may be  left open to air out.  Showering may begin 36-48 hours later.  Cleaning gently with soap and water.  Traumatic wounds should be dressed daily as well.    One layer of adaptic, gauze, Kerlix, then ace wrap.  The adaptic can be discontinued once the draining has ceased    If you have a wet to dry dressing: wet the gauze with saline the squeeze as much saline out so the gauze is moist (not soaking wet), place moistened gauze over wound, then place a dry gauze over the moist one, followed by Kerlix wrap, then ace wrap.

## 2018-05-19 NOTE — Progress Notes (Signed)
Physical Therapy Treatment Note  Patient seen for mobility progression. Pt is making gradual progress toward PT goals. This session focused on functional transfers and R LE ROM. Pt requires max A for supine to sit and mod +2 for sit to stand and stand pivot transfers. Pt is able to maintain NWB R LE throughout. Continue to progress as tolerated with anticipated d/c to SNF for further skilled PT services.     05/19/18 1108  PT Visit Information  Last PT Received On 05/19/18  Assistance Needed +2  History of Present Illness Admitted after fall down stairs resulting in R tibial plateau fx, R ankle fx, and R distal Radius fx; s/p ORIFs, NWB RLE, and can bear weight through R elbow only;  has a pertinent past medical history of CAD (coronary artery disease),  Carotid artery disease ([facility]), Chronic diastolic CHF (congestive heart failure) ([place]),  Neuromuscular disorder ([place]), Rheumatoid arthritis ([place]), Right rotator cuff tear (11/16/2013), and Stroke ([place]).  Precautions  Precautions Fall  Required Braces or Orthoses Other Brace/Splint  Other Brace/Splint R Bledsoe brace (locked in extension at night), R wrist splint, and R CAM boot   Restrictions  Weight Bearing Restrictions Yes  RUE Weight Bearing Weight bear through elbow only  RLE Weight Bearing NWB  Pain Assessment  Pain Assessment Faces  Faces Pain Scale 6  Pain Location RLE  Pain Descriptors / Indicators Grimacing;Aching;Guarding;Sore  Pain Intervention(s) Limited activity within patient's tolerance;Monitored during session;Premedicated before session;Repositioned  Cognition  Arousal/Alertness Awake/alert  Behavior During Therapy WFL for tasks assessed/performed  Overall Cognitive Status Impaired/Different from baseline  Area of Impairment Problem solving;Safety/judgement;Following commands  Following Commands Follows one step commands inconsistently;Follows one step commands with increased time  Safety/Judgement Decreased awareness of  safety  Problem Solving Slow processing  General Comments Requiring increased time and cues  Bed Mobility  Overal bed mobility Needs Assistance  Bed Mobility Supine to Sit  Supine to sit Mod assist;HOB elevated;+2 for physical assistance  General bed mobility comments assist to bring R LE to EOB and to lower from EOB and scoot hips with use of bed pad; pt able to bring hips most of the way with use of L rail for support   Transfers  Overall transfer level Needs assistance  Equipment used Right platform walker  Transfers Sit to/from Stand;Stand Pivot Transfers  Sit to Stand +2 physical assistance;Mod assist  Stand pivot transfers +2 safety/equipment;Mod assist  General transfer comment cues for positioning, safe hand placement, and safe use of AD; assist to power up into standing and for balance and guiding RW for pivot EOB to recliner; pt able to maintain NWB R LE; pt sat abruptly without warning  Balance  Overall balance assessment Needs assistance  Sitting-balance support Feet supported;Single extremity supported  Sitting balance-Leahy Scale Fair  Standing balance support Bilateral upper extremity supported;During functional activity  Standing balance-Leahy Scale Poor  Exercises  Exercises General Lower Extremity  General Exercises - Lower Extremity  Ankle Circles/Pumps AROM;Both;Limitations  Quad Sets AROM;Both  Heel Slides AAROM;Right  Ankle Circles/Pumps Limitations R ankle ROM limitations  PT - End of Session  Equipment Utilized During Treatment Gait belt;Other (comment) (wrist splint and bledsoe brace)  Activity Tolerance Patient tolerated treatment well  Patient left with call bell/phone within reach;in chair;with family/visitor present  Nurse Communication Mobility status   PT - Assessment/Plan  PT Plan Current plan remains appropriate  PT Visit Diagnosis Unsteadiness on feet (R26.81);Other abnormalities of gait and mobility (R26.89);Muscle weakness (generalized)  (M62.81);History  of falling (Z91.81);Pain  Pain - Right/Left Right  Pain - part of body Leg (and arm)  PT Frequency (ACUTE ONLY) Min 2X/week  Follow Up Recommendations SNF  PT equipment Wheelchair (measurements PT)  AM-PAC PT "6 Clicks" Daily Activity Outcome Measure  Difficulty turning over in bed (including adjusting bedclothes, sheets and blankets)? 1  Difficulty moving from lying on back to sitting on the side of the bed?  1  Difficulty sitting down on and standing up from a chair with arms (e.g., wheelchair, bedside commode, etc,.)? 1  Help needed moving to and from a bed to chair (including a wheelchair)? 2  Help needed walking in hospital room? 1  Help needed climbing 3-5 steps with a railing?  1  6 Click Score 7  Mobility G Code  CM  PT Goal Progression  Progress towards PT goals Progressing toward goals  PT Time Calculation  PT Start Time (ACUTE ONLY) 1033  PT Stop Time (ACUTE ONLY) 1056  PT Time Calculation (min) (ACUTE ONLY) 23 min  PT General Charges  $$ ACUTE PT VISIT 1 Visit  PT Treatments  $Therapeutic Exercise 8-22 mins  $Therapeutic Activity 8-22 mins   Earney Navy, PTA Acute Rehabilitation Services Pager: [phone] Office: [phone]

## 2018-05-19 NOTE — Progress Notes (Signed)
Orthopaedic Trauma Progress Note  S: Doing okay, still having pain issues.  O:  Vitals:   05/18/18 1935 05/19/18 0434  BP: 131/60 (!) 134/58  Pulse: 81 80  Resp: 16 15  Temp: 98.6 F (37 C) 98.2 F (36.8 C)  SpO2: 100% 100%   General: No acute distress patient is awake and alert. Right upper extremity: Dressing in place.  Sensation and motor function is intact to median, radial and ulnar nerve distribution. Right lower extremity: Incisions clean, dry and intact  Imaging: Stable postop imaging  Labs:  Results for orders placed or performed during the hospital encounter of 05/13/18 (from the past 24 hour(s))  CBC with Differential/Platelet     Status: Abnormal   Collection Time: 05/19/18  1:31 AM  Result Value Ref Range   WBC 8.6 4.0 - 10.5 K/uL   RBC 2.62 (L) 3.87 - 5.11 MIL/uL   Hemoglobin 7.7 (L) 12.0 - 15.0 g/dL   HCT 24.4 (L) 36.0 - 46.0 %   MCV 93.1 80.0 - 100.0 fL   MCH 29.4 26.0 - 34.0 pg   MCHC 31.6 30.0 - 36.0 g/dL   RDW 14.3 11.5 - 15.5 %   Platelets 258 150 - 400 K/uL   nRBC 0.0 0.0 - 0.2 %   Neutrophils Relative % 72 %   Neutro Abs 6.2 1.7 - 7.7 K/uL   Lymphocytes Relative 18 %   Lymphs Abs 1.6 0.7 - 4.0 K/uL   Monocytes Relative 7 %   Monocytes Absolute 0.6 0.1 - 1.0 K/uL   Eosinophils Relative 2 %   Eosinophils Absolute 0.1 0.0 - 0.5 K/uL   Basophils Relative 0 %   Basophils Absolute 0.0 0.0 - 0.1 K/uL   Immature Granulocytes 1 %   Abs Immature Granulocytes 0.05 0.00 - 0.07 K/uL  Basic metabolic panel     Status: Abnormal   Collection Time: 05/19/18  1:31 AM  Result Value Ref Range   Sodium 133 (L) 135 - 145 mmol/L   Potassium 3.9 3.5 - 5.1 mmol/L   Chloride 96 (L) 98 - 111 mmol/L   CO2 29 22 - 32 mmol/L   Glucose, Bld 113 (H) 70 - 99 mg/dL   BUN 15 8 - 23 mg/dL   Creatinine, Ser 0.57 0.44 - 1.00 mg/dL   Calcium 8.3 (L) 8.9 - 10.3 mg/dL   GFR calc non Af Amer >60 >60 mL/min   GFR calc Af Amer >60 >60 mL/min   Anion gap 8 5 - 15  Occult blood  card to lab, stool RN will collect     Status: None   Collection Time: 05/19/18  2:57 AM  Result Value Ref Range   Fecal Occult Bld NEGATIVE NEGATIVE    Assessment: 67 year old female s/p fall down stairs  Injuries: 1. Right lateral tibial plateau fracture s/p ORIF 2. Right trimalleolar ankle fracture s/p ORIF 3. Right distal radius fracture s/p ORIF   Weightbearing: NWB RLE, WBAT RUE thru elbow  Insicional and dressing care: Dry dressings PRN  Orthopedic device(s):Right wrist brace, right boot  CV/Blood loss:Hgb this AM 7.7 this AM. Continue to monitor Hemodynamically stable  Pain management:  1. Oral dilaudid 2 gm q 3 hours PRN 2. IV dilaudid 1 gm q 2 hours PRN for breakthrough 3. Scheduled tylenol 650 mg q 6 hours 4. Toradol 15 mg IV q 6 hours  VTE prophylaxis: Lovenox 40 mg today  ID: Ancef 2gm postoperatively-Completed  Foley/Lines: Medlock IVF  Medical co-morbidities:  1. RA/Lupus-Stopped DMARD and methotrexate, will recommend restarting about 2 weeks postoperatively. Continue prednisone   Impediments to Fracture Healing: 1. Long term corticosteroid use and RA/Lupus history  Dispo: PT/OT, SNF  Follow - up plan: 2 weeks  Shona Needles, MD [facility] [phone] (phone)

## 2018-05-19 NOTE — Progress Notes (Signed)
Assessment  & Plan :    Principal Problem:   Multiple fractures Active Problems:   Essential hypertension   Rheumatoid arthritis ([place])   Coronary artery disease involving native coronary artery of native heart without angina pectoris   Chronic diastolic CHF (congestive heart failure) (HCC)   Fracture of distal end of right radius   Tibial plateau fracture, right, closed, initial encounter   Fall at home, initial encounter  Brief Summary  67 y.o. female with medical history significant of RA, CAD s/p stents, chronic diastolic CHF, HTN, TIA admitted on 05/14/2018 after sustaining a fall (transferred from [facility] to [place]), imaging studies revealed  R distal radius fx, R tibial plateu fractures, R ankle Trimalleolar fractures, d/c to SNF if pain is controlled on oral agents only  Subjective- Today in pain  And needed IV dilaudid-overall doesn't feel better in some ways feels a little worse with pain control Can hardly lift her RLE Her R arm however is slightly improved Passing some stool  It is very difficult to use Toileting pan   Plan:- 1)S/p Fall with multiple injuries injuries:  A). Right lateral tibial plateau fracture s/p ORIF on 05/15/18 B). Right trimalleolar ankle fracture s/p ORIF on 05/15/18 C) Right distal radius fracture s/p ORIF on 05/15/18                   Weightbearing: NWB RLE, WBAT RUE thru elbow                  Insicional and dressing care: Keep splints clean dry and intact                  Orthopedic device(s):Right wrist brace  pain control remains challenging, PCA pump discontinued on 05/18/2018, changed meds to reflect Orthopedics thoughts--still requiring IV dilaudid and so cannot d/c today to skilled faciltiy--pain is rated close to a 9/10 in multiple sites and will need to be better controlled to a 6/10 range prior to d/c Have scheduled Toradol and  tylenol today   2) acute on chronic anemia-- normocytic normochromic anemia of chronic disease in the setting of chronic rheumatoid arthritis,  now acute blood loss anemia secondary to fractures and operative repair,   hemoglobin now is dropped to 7.9 (stable x24 hours),  Hemoccult neg, currently tolerating anemia well, consider transfusion of packed cells especially given the patient has CAD which we will try and keep hemoglobin close to 9  3)HFpEF--- patient with history of chronic CHF, no acute exacerbation at this time, continue cardiac meds  as below #5  4)RA----orthopedic team is requesting that we will continue to hold DMARD and methotrexate through 06/01/2018, however okay to continue prednisone   5)h/o CAD--- status post prior coronary stent, stable, no ACS symptoms, continue aspirin, Plavix metoprolol and ramipril  6)Presumed osteoporosis----patient with chronic steroid use now status post fall with multiple fractures, pharmacy consult regarding biphosphonate counseling, plan is to start Fosamax as outpatient   Disposition/Need for in-Hospital Stay- patient unable to be discharged at this time due to pain control remains challenging, PCA pump discontinued on 05/18/2018,await further better control of pain on oral regimen which is being optimized over next several days  Code Status :  Full  Disposition Plan  : SNF when pain is controlled with oral agents   Consults  :  ortho  DVT Prophylaxis  :  Lovenox -  Lab Results  Component Value Date   PLT 258 05/19/2018    Inpatient Medications  Scheduled Meds: . allopurinol  400 mg Oral Daily  . aspirin  81 mg Oral Daily  . atorvastatin  80 mg Oral q1800  . bisacodyl  10 mg Rectal Once  . clopidogrel  75 mg Oral Daily  . enoxaparin (LOVENOX) injection  40 mg Subcutaneous Q24H  . ferrous sulfate  325 mg Oral TID WC  . folic acid  1 mg Oral Daily  . furosemide  40 mg Oral Daily  . gabapentin  300 mg Oral TID  . loratadine  10  mg Oral Daily  . metoprolol tartrate  2.5 mg Intravenous Q12H  . pantoprazole  80 mg Oral Q1200  . predniSONE  20 mg Oral Q breakfast  . ramipril  2.5 mg Oral Daily  . senna-docusate  2 tablet Oral BID   Continuous Infusions: . sodium chloride 10 mL/hr at 05/14/18 0800  . lactated ringers 10 mL/hr at 05/15/18 2352   PRN Meds:.sodium chloride, acetaminophen **OR** acetaminophen, alum & mag hydroxide-simeth, HYDROmorphone (DILAUDID) injection, HYDROmorphone, ondansetron **OR** ondansetron (ZOFRAN) IV    Anti-infectives (From admission, onward)   Start     Dose/Rate Route Frequency Ordered Stop   05/15/18 2330  ceFAZolin (ANCEF) IVPB 2g/100 mL premix     2 g 200 mL/hr over 30 Minutes Intravenous Every 8 hours 05/15/18 2210 05/16/18 1427   05/15/18 1915  vancomycin (VANCOCIN) powder  Status:  Discontinued       As needed 05/15/18 1915 05/15/18 1924   05/15/18 1545  ceFAZolin (ANCEF) IVPB 2g/100 mL premix     2 g 200 mL/hr over 30 Minutes Intravenous On call to O.R. 05/15/18 1346 05/15/18 1535   05/15/18 1349  ceFAZolin (ANCEF) 2-4 GM/100ML-% IVPB    Note to Pharmacy:  Henrine Screws   : cabinet override      05/15/18 1349 05/15/18 1535   05/14/18 1000  hydroxychloroquine (PLAQUENIL) tablet 300 mg  Status:  Discontinued     300 mg Oral Daily 05/14/18 0349 05/16/18 1548       Objective:   Vitals:   05/18/18 0852 05/18/18 0940 05/18/18 1935 05/19/18 0434  BP: (!) 147/57 127/67 131/60 (!) 134/58  Pulse: 79 72 81 80  Resp: 15  16 15   Temp: 98.5 F (36.9 C)  98.6 F (37 C) 98.2 F (36.8 C)  TempSrc: Oral  Oral Oral  SpO2: 100%  100% 100%  Weight:      Height:        Wt Readings from Last 3 Encounters:  05/14/18 100.3 kg  02/21/18 93.9 kg  01/11/17 105 kg    Intake/Output Summary (Last 24 hours) at 05/19/2018 1109 Last data filed at 05/18/2018 1334 Gross per 24 hour  Intake -  Output 800 ml  Net -800 ml   Physical Exam Patient is examined daily including today on  05/18/18 , exams remain the same as of yesterday except that has changed   Gen:- Awake Alert,  In no apparent distress  HEENT:- [place].AT, No sclera icterus Neck-Supple Neck,No JVD,.  Lungs-  CTAB , good air movement CV- S1, S2 normal, regular Abd-  +ve B.Sounds, Abd Soft, No tenderness,    Extremity/Skin:- No  edema, right upper extremity and right lower extremity  in splints Psych-affect is appropriate, oriented x3 Neuro-no new focal deficits, no tremors   Data Review:   Micro Results Recent Results (from the past 240 hour(s))  MRSA PCR Screening     Status: None   Collection Time: 05/14/18  9:50 PM  Result Value Ref Range Status   MRSA by PCR NEGATIVE NEGATIVE Final    Comment:        The GeneXpert MRSA Assay (FDA approved for NASAL specimens only), is one component of a comprehensive MRSA colonization surveillance program. It is not intended to diagnose MRSA infection nor to guide or monitor treatment for MRSA infections. Performed at [facility], [place] [address], [place], [place] [address]     Radiology Reports Dg Chest 2 View  Result Date: 05/14/2018 CLINICAL DATA:  67 year old female with fall. History of CHF. EXAM: CHEST - 2 VIEW COMPARISON:  Chest CT dated 05/14/2018 FINDINGS: There is cardiomegaly with probable mild vascular congestion. No focal consolidation, pleural effusion, or pneumothorax. No acute osseous pathology. IMPRESSION: Cardiomegaly with probable mild vascular congestion. No focal consolidation. Electronically Signed   By: Anner Crete M.D.   On: 05/14/2018 01:10   Dg Pelvis 1-2 Views  Result Date: 05/14/2018 CLINICAL DATA:  Fall EXAM: PELVIS - 1-2 VIEW COMPARISON:  None. FINDINGS: There is no evidence of pelvic fracture or diastasis. No pelvic bone lesions are seen. IMPRESSION: Negative. Electronically Signed   By: Kerby Moors M.D.   On: 05/14/2018 01:10   Dg Forearm Right  Result Date: 05/14/2018 CLINICAL DATA:  Fall.  Right wrist pain.  EXAM: RIGHT FOREARM - 2 VIEW COMPARISON:  None. FINDINGS: There is a an acute intra-articular fracture deformity involving the distal radius. Dorsal angulation of the distal fracture fragments noted. No dislocation identified. IMPRESSION: 1. Acute fracture involves the dorsal aspect of the distal radius. Mild dorsal angulation of the distal fracture fragments. Electronically Signed   By: Kerby Moors M.D.   On: 05/14/2018 01:07   Dg Wrist Complete Left  Result Date: 05/14/2018 CLINICAL DATA:  67 year old female with fall and left wrist pain. EXAM: LEFT WRIST - COMPLETE 3+ VIEW COMPARISON:  None. FINDINGS: There is no acute fracture or dislocation. The bones are osteopenic. No significant arthritic changes. There is soft tissue swelling over the wrist and ulna. No radiopaque foreign object or soft tissue gas. IMPRESSION: No acute fracture or dislocation. Electronically Signed   By: Anner Crete M.D.   On: 05/14/2018 00:13   Dg Wrist Complete Right  Result Date: 05/15/2018 CLINICAL DATA:  Postoperative right wrist EXAM: RIGHT WRIST - COMPLETE 3+ VIEW COMPARISON:  Intraoperative fluoroscopy 05/15/2018 FINDINGS: Postoperative plate and screw fixation of the distal right radial metaphysis. Are where components appear intact and well seated. Near-anatomic alignment of the fracture fragments. Degenerative changes in the radiocarpal and STT joints. Overlying splint material obscures some bone detail. IMPRESSION: Postoperative plate and screw fixation of the distal right radial metaphysis without apparent complication. Electronically Signed   By: Lucienne Capers M.D.   On: 05/15/2018 22:09   Dg Wrist Complete Right  Result Date: 05/15/2018 CLINICAL DATA:  67 year old female with right distal radial fracture. Subsequent encounter. EXAM: DG C-ARM 61-120 MIN; RIGHT WRIST - COMPLETE 3+ VIEW Fluoroscopic time: 49.7 seconds. COMPARISON:  05/14/2018. FINDINGS: Five intraoperative C-arm views submitted for review  after surgery. Distal right radial fracture reduced utilizing plate and screws. Much better alignment of fracture fragments with minimal separation/incongruity of articular surface. IMPRESSION: Open reduction and internal fixation  distal right radius fracture. Electronically Signed   By: Genia Del M.D.   On: 05/15/2018 20:07   Dg Wrist Complete Right  Result Date: 05/14/2018 CLINICAL DATA:  Pain after fall EXAM: RIGHT WRIST - COMPLETE 3+ VIEW COMPARISON:  None. FINDINGS: There is a comminuted mildly displaced fracture through the distal radial metaphysis. IMPRESSION: Comminuted mildly displaced fracture through the distal radial metaphysis. Electronically Signed   By: Dorise Bullion III M.D   On: 05/14/2018 00:12   Dg Knee 2 Views Right  Result Date: 05/14/2018 CLINICAL DATA:  Pain after fall EXAM: RIGHT KNEE - 1-2 VIEW COMPARISON:  None. FINDINGS: A hemarthrosis is identified. There is a depressed lateral tibial plateau fracture which is comminuted. IMPRESSION: Comminuted mildly depressed lateral tibial plateau fracture with resulting hemarthrosis. Electronically Signed   By: Dorise Bullion III M.D   On: 05/14/2018 00:08   Dg Tibia/fibula Right  Result Date: 05/15/2018 CLINICAL DATA:  67 year old female with tibial plateau fracture and right ankle fracture. Subsequent encounter. EXAM: RIGHT TIBIA AND FIBULA - 2 VIEW; DG C-ARM 61-120 MIN Fluoroscopic time: 3 minutes and 8 seconds. COMPARISON:  05/14/2018 CT. FINDINGS: Eleven intraoperative film submitted for review after surgery. Six intraoperative C-arm views of the right knee: Reduction of lateral tibial plateau fracture with sideplate and screws. Articular surface with better alignment although with slight depression. Five intraoperative C-arm views of the right ankle: Reduction of fibular fracture with sideplate and screws. Screws transfix the distal fibula-tibia joint space. Reduction of medial malleolar fracture with screw. Evaluation of  posterior tibial fracture limited secondary to overlying plate. IMPRESSION: Open reduction and internal fixation right knee and right ankle fractures as noted above. Electronically Signed   By: Genia Del M.D.   On: 05/15/2018 17:57   Dg Tibia/fibula Right  Result Date: 05/14/2018 CLINICAL DATA:  Pain after fall EXAM: RIGHT TIBIA AND FIBULA - 2 VIEW COMPARISON:  None. FINDINGS: There is a comminuted depressed fracture through the lateral tibial plateau. There is a comminuted fracture through the distal fibular diaphysis. There is a lucency through the distal medial malleolus with a step-off. These hemarthrosis in the suprapatellar fossa. IMPRESSION: 1. Depressed comminuted lateral tibial plateau fracture. 2. Comminuted fracture through the distal fibular diaphysis. 3. Fracture through the distal medial malleolus. Electronically Signed   By: Dorise Bullion III M.D   On: 05/14/2018 00:14   Dg Ankle Complete Right  Result Date: 05/14/2018 CLINICAL DATA:  Pain after fall EXAM: RIGHT ANKLE - COMPLETE 3+ VIEW COMPARISON:  None. FINDINGS: There is a comminuted fracture through the distal fibular diaphysis. There is a fracture through the medial malleolus. There also be appears to be a fracture through the distal tibia where it abuts the fibula. Irregularity of the distal most fibula is consistent with an age indeterminate fracture but may not be acute. Soft tissue swelling. IMPRESSION: 1. Fracture through the medial malleolus. 2. Apparent fracture through the lateral aspect of the distal tibia with displacement. 3. Fracture through the fibular diaphysis distally. 4. Irregularity of the distal tip of the fibula may be nonacute from previous injury. 5. No definitive posterior malleolar fracture. However, given the complex nature of the ankle fracture, consider a CT scan for better evaluation. Electronically Signed   By: Dorise Bullion III M.D   On: 05/14/2018 00:17   Ct Head Wo Contrast  Result Date:  05/13/2018 CLINICAL DATA:  67 year old female with facial trauma. EXAM: CT HEAD WITHOUT CONTRAST CT MAXILLOFACIAL WITHOUT CONTRAST CT CERVICAL SPINE  WITHOUT CONTRAST TECHNIQUE: Multidetector CT imaging of the head, cervical spine, and maxillofacial structures were performed using the standard protocol without intravenous contrast. Multiplanar CT image reconstructions of the cervical spine and maxillofacial structures were also generated. COMPARISON:  Head CT dated 07/23/2007 FINDINGS: CT HEAD FINDINGS Brain: The ventricles and sulci appropriate size for patient's age. The gray-white matter discrimination is preserved. There is no acute intracranial hemorrhage. No mass effect or midline shift. No extra-axial fluid collection. Vascular: No hyperdense vessel or unexpected calcification. Skull: Normal. Negative for fracture or focal lesion. Other: None. CT MAXILLOFACIAL FINDINGS Osseous: No fracture or mandibular dislocation. No destructive process. Orbits: Negative. No traumatic or inflammatory finding. Sinuses: Clear. Soft tissues: Negative. CT CERVICAL SPINE FINDINGS Alignment: No acute subluxation. Straightening of normal cervical lordosis which may be positional or due to muscle spasm. Skull base and vertebrae: No acute fracture. Soft tissues and spinal canal: No prevertebral fluid or swelling. No visible canal hematoma. Disc levels:  Degenerative changes at C5-C6. Upper chest: Negative. Other: None IMPRESSION: 1. No acute intracranial pathology. 2. No acute facial bone fractures. 3. No acute/traumatic cervical spine pathology. Electronically Signed   By: Anner Crete M.D.   On: 05/13/2018 23:40   Ct Chest W Contrast  Result Date: 05/14/2018 CLINICAL DATA:  67 year old female with trauma. EXAM: CT CHEST, ABDOMEN, AND PELVIS WITH CONTRAST TECHNIQUE: Multidetector CT imaging of the chest, abdomen and pelvis was performed following the standard protocol during bolus administration of intravenous contrast.  CONTRAST:  167mL ISOVUE-300 IOPAMIDOL (ISOVUE-300) INJECTION 61% COMPARISON:  CT of the abdomen pelvis dated 12/02/2016 FINDINGS: CT CHEST FINDINGS Cardiovascular: There is mild cardiomegaly. No pericardial effusion. Multi vessel coronary vascular calcification. Mild atherosclerotic calcification of the thoracic aorta. No aneurysmal dilatation or dissection. The visualized origins of the great vessels of the aortic arch are patent. The central pulmonary arteries are grossly unremarkable. Mediastinum/Nodes: No hilar or mediastinal adenopathy. Esophagus and thyroid gland are grossly unremarkable. No mediastinal fluid collection. Lungs/Pleura: Bibasilar linear atelectasis/scarring. The lungs are clear. There is no pleural effusion or pneumothorax. The central airways are patent. Musculoskeletal: No chest wall mass or suspicious bone lesions identified. CT ABDOMEN PELVIS FINDINGS No intra-abdominal free air or free fluid. Hepatobiliary: The liver is unremarkable. Cholecystectomy. Mild intrahepatic biliary ductal dilatation. Pancreas: Unremarkable. No pancreatic ductal dilatation or surrounding inflammatory changes. Spleen: Normal in size without focal abnormality. Adrenals/Urinary Tract: The adrenal glands are unremarkable. Small nonobstructing right renal inferior pole hypodense focus which is too small to characterize. There is no hydronephrosis on either side. There is symmetric enhancement and excretion of contrast by both kidneys. The visualized ureters and urinary bladder appear unremarkable. Stomach/Bowel: There is sigmoid diverticulosis without active inflammatory changes. A gastric lap band is noted. There is no bowel obstruction or active inflammation. The appendix is not visualized with certainty. No inflammatory changes identified in the right lower quadrant. Vascular/Lymphatic: Moderate aortoiliac atherosclerotic disease. No portal venous gas. There is no adenopathy. Reproductive: The uterus is anteverted  and grossly unremarkable. Other: None Musculoskeletal: Grade 1 L4-L5 anterolisthesis. No acute osseous pathology. IMPRESSION: No acute intrathoracic, abdominal, or pelvic pathology. Electronically Signed   By: Anner Crete M.D.   On: 05/14/2018 02:42   Ct Cervical Spine Wo Contrast  Result Date: 05/13/2018 CLINICAL DATA:  67 year old female with facial trauma. EXAM: CT HEAD WITHOUT CONTRAST CT MAXILLOFACIAL WITHOUT CONTRAST CT CERVICAL SPINE WITHOUT CONTRAST TECHNIQUE: Multidetector CT imaging of the head, cervical spine, and maxillofacial structures were performed using the standard protocol without  intravenous contrast. Multiplanar CT image reconstructions of the cervical spine and maxillofacial structures were also generated. COMPARISON:  Head CT dated 07/23/2007 FINDINGS: CT HEAD FINDINGS Brain: The ventricles and sulci appropriate size for patient's age. The gray-white matter discrimination is preserved. There is no acute intracranial hemorrhage. No mass effect or midline shift. No extra-axial fluid collection. Vascular: No hyperdense vessel or unexpected calcification. Skull: Normal. Negative for fracture or focal lesion. Other: None. CT MAXILLOFACIAL FINDINGS Osseous: No fracture or mandibular dislocation. No destructive process. Orbits: Negative. No traumatic or inflammatory finding. Sinuses: Clear. Soft tissues: Negative. CT CERVICAL SPINE FINDINGS Alignment: No acute subluxation. Straightening of normal cervical lordosis which may be positional or due to muscle spasm. Skull base and vertebrae: No acute fracture. Soft tissues and spinal canal: No prevertebral fluid or swelling. No visible canal hematoma. Disc levels:  Degenerative changes at C5-C6. Upper chest: Negative. Other: None IMPRESSION: 1. No acute intracranial pathology. 2. No acute facial bone fractures. 3. No acute/traumatic cervical spine pathology. Electronically Signed   By: Anner Crete M.D.   On: 05/13/2018 23:40   Ct Knee  Right Wo Contrast  Result Date: 05/14/2018 CLINICAL DATA:  67 year old female with fall and right knee pain. EXAM: CT OF THE right KNEE WITHOUT CONTRAST TECHNIQUE: Multidetector CT imaging of the right knee was performed according to the standard protocol. Multiplanar CT image reconstructions were also generated. COMPARISON:  Earlier radiograph dated 05/13/2018 FINDINGS: Bones/Joint/Cartilage There is a comminuted intra-articular fracture of the proximal tibia with mild depression of the lateral tibial plateau. The bones are osteopenic. There is no dislocation. There is a large suprapatellar lipohemarthrosis. Ligaments Suboptimally assessed by CT. Muscles and Tendons No acute findings. No intramuscular hematoma. Soft tissues There is diffuse subcutaneous stranding of the lateral knee. No fluid collection. IMPRESSION: 1. Comminuted intra-articular fracture of the proximal tibia with mild depression of the lateral tibial plateau. 2. Large suprapatellar lipohemarthrosis. Electronically Signed   By: Anner Crete M.D.   On: 05/14/2018 02:33   Ct Abdomen Pelvis W Contrast  Result Date: 05/14/2018 CLINICAL DATA:  68 year old female with trauma. EXAM: CT CHEST, ABDOMEN, AND PELVIS WITH CONTRAST TECHNIQUE: Multidetector CT imaging of the chest, abdomen and pelvis was performed following the standard protocol during bolus administration of intravenous contrast. CONTRAST:  118mL ISOVUE-300 IOPAMIDOL (ISOVUE-300) INJECTION 61% COMPARISON:  CT of the abdomen pelvis dated 12/02/2016 FINDINGS: CT CHEST FINDINGS Cardiovascular: There is mild cardiomegaly. No pericardial effusion. Multi vessel coronary vascular calcification. Mild atherosclerotic calcification of the thoracic aorta. No aneurysmal dilatation or dissection. The visualized origins of the great vessels of the aortic arch are patent. The central pulmonary arteries are grossly unremarkable. Mediastinum/Nodes: No hilar or mediastinal adenopathy. Esophagus and  thyroid gland are grossly unremarkable. No mediastinal fluid collection. Lungs/Pleura: Bibasilar linear atelectasis/scarring. The lungs are clear. There is no pleural effusion or pneumothorax. The central airways are patent. Musculoskeletal: No chest wall mass or suspicious bone lesions identified. CT ABDOMEN PELVIS FINDINGS No intra-abdominal free air or free fluid. Hepatobiliary: The liver is unremarkable. Cholecystectomy. Mild intrahepatic biliary ductal dilatation. Pancreas: Unremarkable. No pancreatic ductal dilatation or surrounding inflammatory changes. Spleen: Normal in size without focal abnormality. Adrenals/Urinary Tract: The adrenal glands are unremarkable. Small nonobstructing right renal inferior pole hypodense focus which is too small to characterize. There is no hydronephrosis on either side. There is symmetric enhancement and excretion of contrast by both kidneys. The visualized ureters and urinary bladder appear unremarkable. Stomach/Bowel: There is sigmoid diverticulosis without active inflammatory changes. A  gastric lap band is noted. There is no bowel obstruction or active inflammation. The appendix is not visualized with certainty. No inflammatory changes identified in the right lower quadrant. Vascular/Lymphatic: Moderate aortoiliac atherosclerotic disease. No portal venous gas. There is no adenopathy. Reproductive: The uterus is anteverted and grossly unremarkable. Other: None Musculoskeletal: Grade 1 L4-L5 anterolisthesis. No acute osseous pathology. IMPRESSION: No acute intrathoracic, abdominal, or pelvic pathology. Electronically Signed   By: Anner Crete M.D.   On: 05/14/2018 02:42   Ct Ankle Right Wo Contrast  Result Date: 05/14/2018 CLINICAL DATA:  67 year old female with fall and right ankle pain. EXAM: CT OF THE RIGHT ANKLE WITHOUT CONTRAST TECHNIQUE: Multidetector CT imaging of the right ankle was performed according to the standard protocol. Multiplanar CT image  reconstructions were also generated. COMPARISON:  Right ankle radiograph dated 05/13/2018 FINDINGS: Bones/Joint/Cartilage There is a comminuted fracture of the distal fibula with extension of the fracture into the distal tibia-fibular syndesmosis. There is a minimally displaced fracture of the lateral aspect of the tibial plafond and nondisplaced fracture of the posterior malleolus. There is a minimally displaced fracture of the medial malleolus. There bones are osteopenic. Discontinuity of the plantar cortex of the medial cuneiform (series 5, image 58 and sagittal series 6, image 89) concerning for an avulsion fracture. There is also linear lucency through the plantar base of the first metatarsal (series 6, image 90) also concerning for an avulsion corner fracture. Probable nondisplaced fracture of the dorsal aspect of the medial cuneiform (series 4, image 146 and sagittal series 6, image 69). Fracture of the base of the second metatarsal (series 6, image 73 and series 4, image 161). Nondisplaced linear lucency through the proximal fourth metatarsal (series 6, image 58) may be artifactual or represent a vascular groove or a nondisplaced fracture. No dislocation. Diffuse soft tissue edema. Ligaments Suboptimally assessed by CT. Muscles and Tendons No acute intramuscular findings. Soft tissues Diffuse subcutaneous edema. IMPRESSION: 1. Comminuted fracture of the distal fibula with extension of the fracture into the distal tibia-fibular syndesmosis. 2. Minimally displaced fracture of the lateral aspect of the tibial plafond and nondisplaced fracture of the posterior malleolus. 3. Minimally displaced fracture of the medial malleolus. 4. Nondisplaced fracture of the base of the second metatarsal. 5. Linear lucency through the plantar cortex of the medial cuneiform concerning for an avulsion fracture. 6. Linear lucency through the proximal fourth metatarsal may be artifactual or represent a nondisplaced fracture.  Electronically Signed   By: Anner Crete M.D.   On: 05/14/2018 02:55   Dg Shoulder Left  Result Date: 05/14/2018 CLINICAL DATA:  General left shoulder pain.  Pain after fall. EXAM: LEFT SHOULDER - 2+ VIEW COMPARISON:  None. FINDINGS: There is no evidence of fracture or dislocation. There is no evidence of arthropathy or other focal bone abnormality. Soft tissues are unremarkable. IMPRESSION: Negative. Electronically Signed   By: Dorise Bullion III M.D   On: 05/14/2018 00:07   Dg Knee Right Port  Result Date: 05/15/2018 CLINICAL DATA:  Postoperative right knee EXAM: PORTABLE RIGHT KNEE - 1-2 VIEW COMPARISON:  Intraoperative fluoroscopy 05/15/2018 FINDINGS: Postoperative changes with lateral plate and screw fixation of the proximal tibial metaphysis and lateral tibial plateau. Hardware components appear intact and well seated. Degenerative changes in the right knee. Soft tissue gas consistent with recent surgery. IMPRESSION: Postoperative internal fixation of lateral tibial plateau fracture. Electronically Signed   By: Lucienne Capers M.D.   On: 05/15/2018 22:06   Dg Ankle Right Port  Result Date: 05/15/2018 CLINICAL DATA:  Postoperative right ankle EXAM: PORTABLE RIGHT ANKLE - 2 VIEW COMPARISON:  Intraoperative fluoroscopy 05/15/2018 FINDINGS: Postoperative changes with plate and screw fixation of a fracture of the distal fibular shaft and screw fixation of the medial malleolar fracture fragment. Hardware components appear intact and well seated. Near-anatomic alignment of the fracture fragments. Overlying splint material obscures bone detail. IMPRESSION: Postoperative internal fixation of fractures of the right distal fibula and medial malleolus. Electronically Signed   By: Lucienne Capers M.D.   On: 05/15/2018 22:08   Dg Hand Complete Right  Result Date: 05/14/2018 CLINICAL DATA:  67 year old female with fall and trauma to the right hand. EXAM: RIGHT HAND - COMPLETE 3+ VIEW COMPARISON:  Right  wrist radiograph dated 05/13/2018 FINDINGS: Comminuted mildly displaced intra-articular fracture of the distal radius as seen on the earlier wrist radiograph. No other acute fracture noted. The bones are osteopenic. No dislocation. Soft tissue swelling of the wrist. IMPRESSION: Comminuted mildly displaced intra-articular fracture of the distal radius. Electronically Signed   By: Anner Crete M.D.   On: 05/14/2018 01:09   Dg C-arm 1-60 Min  Result Date: 05/15/2018 CLINICAL DATA:  67 year old female with right distal radial fracture. Subsequent encounter. EXAM: DG C-ARM 61-120 MIN; RIGHT WRIST - COMPLETE 3+ VIEW Fluoroscopic time: 49.7 seconds. COMPARISON:  05/14/2018. FINDINGS: Five intraoperative C-arm views submitted for review after surgery. Distal right radial fracture reduced utilizing plate and screws. Much better alignment of fracture fragments with minimal separation/incongruity of articular surface. IMPRESSION: Open reduction and internal fixation distal right radius fracture. Electronically Signed   By: Genia Del M.D.   On: 05/15/2018 20:07   Dg C-arm 1-60 Min  Result Date: 05/15/2018 CLINICAL DATA:  67 year old female with tibial plateau fracture and right ankle fracture. Subsequent encounter. EXAM: RIGHT TIBIA AND FIBULA - 2 VIEW; DG C-ARM 61-120 MIN Fluoroscopic time: 3 minutes and 8 seconds. COMPARISON:  05/14/2018 CT. FINDINGS: Eleven intraoperative film submitted for review after surgery. Six intraoperative C-arm views of the right knee: Reduction of lateral tibial plateau fracture with sideplate and screws. Articular surface with better alignment although with slight depression. Five intraoperative C-arm views of the right ankle: Reduction of fibular fracture with sideplate and screws. Screws transfix the distal fibula-tibia joint space. Reduction of medial malleolar fracture with screw. Evaluation of posterior tibial fracture limited secondary to overlying plate. IMPRESSION: Open  reduction and internal fixation right knee and right ankle fractures as noted above. Electronically Signed   By: Genia Del M.D.   On: 05/15/2018 17:57   Dg C-arm 1-60 Min  Result Date: 05/15/2018 CLINICAL DATA:  67 year old female with tibial plateau fracture and right ankle fracture. Subsequent encounter. EXAM: RIGHT TIBIA AND FIBULA - 2 VIEW; DG C-ARM 61-120 MIN Fluoroscopic time: 3 minutes and 8 seconds. COMPARISON:  05/14/2018 CT. FINDINGS: Eleven intraoperative film submitted for review after surgery. Six intraoperative C-arm views of the right knee: Reduction of lateral tibial plateau fracture with sideplate and screws. Articular surface with better alignment although with slight depression. Five intraoperative C-arm views of the right ankle: Reduction of fibular fracture with sideplate and screws. Screws transfix the distal fibula-tibia joint space. Reduction of medial malleolar fracture with screw. Evaluation of posterior tibial fracture limited secondary to overlying plate. IMPRESSION: Open reduction and internal fixation right knee and right ankle fractures as noted above. Electronically Signed   By: Genia Del M.D.   On: 05/15/2018 17:57   Ct Maxillofacial Wo Contrast  Result Date:  05/13/2018 CLINICAL DATA:  67 year old female with facial trauma. EXAM: CT HEAD WITHOUT CONTRAST CT MAXILLOFACIAL WITHOUT CONTRAST CT CERVICAL SPINE WITHOUT CONTRAST TECHNIQUE: Multidetector CT imaging of the head, cervical spine, and maxillofacial structures were performed using the standard protocol without intravenous contrast. Multiplanar CT image reconstructions of the cervical spine and maxillofacial structures were also generated. COMPARISON:  Head CT dated 07/23/2007 FINDINGS: CT HEAD FINDINGS Brain: The ventricles and sulci appropriate size for patient's age. The gray-white matter discrimination is preserved. There is no acute intracranial hemorrhage. No mass effect or midline shift. No extra-axial fluid  collection. Vascular: No hyperdense vessel or unexpected calcification. Skull: Normal. Negative for fracture or focal lesion. Other: None. CT MAXILLOFACIAL FINDINGS Osseous: No fracture or mandibular dislocation. No destructive process. Orbits: Negative. No traumatic or inflammatory finding. Sinuses: Clear. Soft tissues: Negative. CT CERVICAL SPINE FINDINGS Alignment: No acute subluxation. Straightening of normal cervical lordosis which may be positional or due to muscle spasm. Skull base and vertebrae: No acute fracture. Soft tissues and spinal canal: No prevertebral fluid or swelling. No visible canal hematoma. Disc levels:  Degenerative changes at C5-C6. Upper chest: Negative. Other: None IMPRESSION: 1. No acute intracranial pathology. 2. No acute facial bone fractures. 3. No acute/traumatic cervical spine pathology. Electronically Signed   By: Anner Crete M.D.   On: 05/13/2018 23:40     CBC Recent Labs  Lab 05/14/18 0050 05/15/18 0443 05/16/18 0344 05/17/18 0304 05/18/18 0231 05/19/18 0131  WBC 18.7* 9.2 10.9* 8.8 8.7 8.6  HGB 11.2* 10.1* 9.1* 7.8* 7.9* 7.7*  HCT 35.4* 32.4* 29.0* 24.8* 25.2* 24.4*  PLT 317 196 224 194 215 258  MCV 94.7 95.6 95.4 93.2 93.7 93.1  MCH 29.9 29.8 29.9 29.3 29.4 29.4  MCHC 31.6 31.2 31.4 31.5 31.3 31.6  RDW 14.7 14.6 14.5 14.4 14.5 14.3  LYMPHSABS 1.6  --  0.4* 1.3 1.8 1.6  MONOABS 1.2*  --  0.5 0.7 0.7 0.6  EOSABS 0.1  --  0.0 0.0 0.1 0.1  BASOSABS 0.1  --  0.0 0.0 0.0 0.0    Chemistries  Recent Labs  Lab 05/15/18 0443 05/16/18 0344 05/17/18 0304 05/18/18 0231 05/19/18 0131  NA 134* 132* 134* 135 133*  K 4.5 4.4 3.7 4.1 3.9  CL 103 99 95* 98 96*  CO2 24 26 31 28 29   GLUCOSE 115* 161* 121* 115* 113*  BUN 12 9 8 9 15   CREATININE 0.54 0.56 0.56 0.55 0.57  CALCIUM 8.8* 8.3* 8.2* 8.3* 8.3*  MG 2.1  --   --   --   --    ------------------------------------------------------------------------------------------------------------------ No  results for input(s): CHOL, HDL, LDLCALC, TRIG, CHOLHDL, LDLDIRECT in the last 72 hours.  Lab Results  Component Value Date   HGBA1C 5.5 03/11/2016   ------------------------------------------------------------------------------------------------------------------ No results for input(s): TSH, T4TOTAL, T3FREE, THYROIDAB in the last 72 hours.  Invalid input(s): FREET3 ------------------------------------------------------------------------------------------------------------------ No results for input(s): VITAMINB12, FOLATE, FERRITIN, TIBC, IRON, RETICCTPCT in the last 72 hours.  Coagulation profile No results for input(s): INR, PROTIME in the last 168 hours.  No results for input(s): DDIMER in the last 72 hours.  Cardiac Enzymes No results for input(s): CKMB, TROPONINI, MYOGLOBIN in the last 168 hours.  Invalid input(s): CK ------------------------------------------------------------------------------------------------------------------    Component Value Date/Time   BNP 106.2 (H) 12/03/2016 0258    Verneita Griffes, MD [facility] 11:09 AM

## 2018-05-20 LAB — COMPREHENSIVE METABOLIC PANEL
ALBUMIN: 2.2 g/dL — AB (ref 3.5–5.0)
ALT: 38 U/L (ref 0–44)
AST: 17 U/L (ref 15–41)
Alkaline Phosphatase: 59 U/L (ref 38–126)
Anion gap: 8 (ref 5–15)
BUN: 14 mg/dL (ref 8–23)
CHLORIDE: 98 mmol/L (ref 98–111)
CO2: 28 mmol/L (ref 22–32)
CREATININE: 0.59 mg/dL (ref 0.44–1.00)
Calcium: 8.1 mg/dL — ABNORMAL LOW (ref 8.9–10.3)
GFR calc non Af Amer: >60 mL/min (ref >60)
GLUCOSE: 104 mg/dL — AB (ref 70–99)
Potassium: 3.7 mmol/L (ref 3.5–5.1)
SODIUM: 134 mmol/L — AB (ref 135–145)
Total Bilirubin: 0.8 mg/dL (ref 0.3–1.2)
Total Protein: 5 g/dL — ABNORMAL LOW (ref 6.5–8.1)

## 2018-05-20 LAB — CBC WITH DIFFERENTIAL/PLATELET
ABS IMMATURE GRANULOCYTES: 0.06 10*3/uL (ref 0.00–0.07)
BASOS ABS: 0 10*3/uL (ref 0.0–0.1)
BASOS PCT: 0 %
Eosinophils Absolute: 0.1 10*3/uL (ref 0.0–0.5)
Eosinophils Relative: 2 %
HCT: 25.2 % — ABNORMAL LOW (ref 36.0–46.0)
HEMOGLOBIN: 7.9 g/dL — AB (ref 12.0–15.0)
IMMATURE GRANULOCYTES: 1 %
LYMPHS PCT: 18 %
Lymphs Abs: 1.5 10*3/uL (ref 0.7–4.0)
MCH: 29.3 pg (ref 26.0–34.0)
MCHC: 31.3 g/dL (ref 30.0–36.0)
MCV: 93.3 fL (ref 80.0–100.0)
MONO ABS: 0.5 10*3/uL (ref 0.1–1.0)
Monocytes Relative: 6 %
NEUTROS PCT: 73 %
NRBC: 0 % (ref 0.0–0.2)
Neutro Abs: 5.9 10*3/uL (ref 1.7–7.7)
PLATELETS: 308 10*3/uL (ref 150–400)
RBC: 2.7 MIL/uL — ABNORMAL LOW (ref 3.87–5.11)
RDW: 14.4 % (ref 11.5–15.5)
WBC: 8.2 10*3/uL (ref 4.0–10.5)

## 2018-05-20 MED ORDER — LIDOCAINE HCL 4 % EX SOLN
Freq: Two times a day (BID) | CUTANEOUS | Status: DC | PRN
  Administered 2018-05-20: 11:00:00 via TOPICAL
  Filled 2018-05-20: qty 50

## 2018-05-20 MED ORDER — IBUPROFEN 200 MG PO TABS
800.0000 mg | ORAL_TABLET | Freq: Three times a day (TID) | ORAL | Status: DC
  Administered 2018-05-20 – 2018-05-22 (×5): 800 mg via ORAL
  Filled 2018-05-20 (×6): qty 4

## 2018-05-20 MED ORDER — VALACYCLOVIR HCL 500 MG PO TABS
2000.0000 mg | ORAL_TABLET | Freq: Once | ORAL | Status: AC
  Administered 2018-05-20: 2000 mg via ORAL
  Filled 2018-05-20: qty 4

## 2018-05-20 NOTE — Progress Notes (Signed)
Assessment  & Plan :    Principal Problem:   Multiple fractures Active Problems:   Essential hypertension   Rheumatoid arthritis ([place])   Coronary artery disease involving native coronary artery of native heart without angina pectoris   Chronic diastolic CHF (congestive heart failure) (HCC)   Fracture of distal end of right radius   Tibial plateau fracture, right, closed, initial encounter   Fall at home, initial encounter  Brief Summary  67 y.o. female with medical history significant of RA, CAD s/p stents, chronic diastolic CHF, HTN, TIA admitted on 05/14/2018 after sustaining a fall (transferred from [facility] to [place]), imaging studies revealed  R distal radius fx, R tibial plateu fractures, R ankle Trimalleolar fractures, d/c to SNF if pain is controlled on oral agents only  Subjective- Fair pain mildy improved Has a cold sore  No fever no chills no n/v,cp,cough,cold   Plan:- 1)S/p Fall with multiple injuries injuries:  A). Right lateral tibial plateau fracture s/p ORIF on 05/15/18 B). Right trimalleolar ankle fracture s/p ORIF on 05/15/18 C) Right distal radius fracture s/p ORIF on 05/15/18                   Weightbearing: NWB RLE, WBAT RUE thru elbow                  Insicional and dressing care: Keep splints clean dry and intact                  Orthopedic device(s):Right wrist brace  pain control remains challenging, PCA pump discontinued on 05/18/2018, changed meds to reflect Orthopedics thoughts--still requiring IV dilaudid and so cannot d/c today to skilled faciltiy--Some improvement with PO scheudled tylenol and toraqdol As such, chgn toradol PO ibuprofen high dose 800 tid Monitor for resolution and stability of pain   2) acute on chronic anemia-- normocytic normochromic anemia of chronic disease in the setting of chronic rheumatoid arthritis,  now acute blood  loss anemia secondary to fractures and operative repair,  Hemoglobin stabilised in 7-8 range Hemoccult neg, currently tolerating anemia well-no current Tr indication  3)HFpEF--- patient with history of chronic CHF, no acute exacerbation at this time, continue cardiac meds  as below #5  4)RA----orthopedic team is requesting that we will continue to hold DMARD and methotrexate through 06/01/2018, however okay to continue prednisone   5)h/o CAD--- status post prior coronary stent, stable, no ACS symptoms, continue aspirin, Plavix metoprolol and ramipril  6)Presumed osteoporosis----patient with chronic steroid use now status post fall with multiple fractures, pharmacy consult regarding biphosphonate counseling, plan is to start Fosamax as outpatient   Disposition/Need for in-Hospital Stay- patient unable to be discharged at this time due to pain control remains challenging, PCA pump discontinued on 05/18/2018,await further better control of pain on oral regimen which is being optimized over next several days  Code Status : Full  Disposition Plan  : SNF when pain is controlled with oral agents   Consults  :  ortho  DVT Prophylaxis  :  Lovenox -  Lab Results  Component Value Date   PLT 308 05/20/2018    Inpatient Medications  Scheduled Meds: . acetaminophen  650 mg Oral Q6H  Or  . acetaminophen  650 mg Rectal Q6H  . allopurinol  400 mg Oral Daily  . aspirin  81 mg Oral Daily  . atorvastatin  80 mg Oral q1800  . bisacodyl  10 mg Rectal Once  . clopidogrel  75 mg Oral Daily  . enoxaparin (LOVENOX) injection  40 mg Subcutaneous Q24H  . ferrous sulfate  325 mg Oral TID WC  . folic acid  1 mg Oral Daily  . furosemide  40 mg Oral Daily  . gabapentin  300 mg Oral TID  . ketorolac  15 mg Intravenous Q6H  . loratadine  10 mg Oral Daily  . metoprolol tartrate  2.5 mg Intravenous Q12H  . pantoprazole  80 mg Oral Q1200  . predniSONE  20 mg Oral Q breakfast  . ramipril  2.5 mg Oral  Daily  . senna-docusate  2 tablet Oral BID   Continuous Infusions: . sodium chloride 10 mL/hr at 05/14/18 0800  . lactated ringers 10 mL/hr at 05/15/18 2352   PRN Meds:.sodium chloride, alum & mag hydroxide-simeth, HYDROmorphone (DILAUDID) injection, HYDROmorphone, lidocaine, ondansetron **OR** ondansetron (ZOFRAN) IV    Anti-infectives (From admission, onward)   Start     Dose/Rate Route Frequency Ordered Stop   05/15/18 2330  ceFAZolin (ANCEF) IVPB 2g/100 mL premix     2 g 200 mL/hr over 30 Minutes Intravenous Every 8 hours 05/15/18 2210 05/16/18 1427   05/15/18 1915  vancomycin (VANCOCIN) powder  Status:  Discontinued       As needed 05/15/18 1915 05/15/18 1924   05/15/18 1545  ceFAZolin (ANCEF) IVPB 2g/100 mL premix     2 g 200 mL/hr over 30 Minutes Intravenous On call to O.R. 05/15/18 1346 05/15/18 1535   05/15/18 1349  ceFAZolin (ANCEF) 2-4 GM/100ML-% IVPB    Note to Pharmacy:  Henrine Screws   : cabinet override      05/15/18 1349 05/15/18 1535   05/14/18 1000  hydroxychloroquine (PLAQUENIL) tablet 300 mg  Status:  Discontinued     300 mg Oral Daily 05/14/18 0349 05/16/18 1548       Objective:   Vitals:   05/19/18 2048 05/20/18 0517 05/20/18 0756 05/20/18 1230  BP: (!) 124/51 136/60 (!) 147/59 (!) 151/53  Pulse: 64 70 81 78  Resp: 17 18 12 12   Temp: 98.1 F (36.7 C) 98.2 F (36.8 C) 98.9 F (37.2 C) 99.4 F (37.4 C)  TempSrc: Oral Oral Oral Oral  SpO2: 98% 97% 98% 98%  Weight:      Height:        Wt Readings from Last 3 Encounters:  05/14/18 100.3 kg  02/21/18 93.9 kg  01/11/17 105 kg    Intake/Output Summary (Last 24 hours) at 05/20/2018 1311 Last data filed at 05/20/2018 8502 Gross per 24 hour  Intake -  Output 800 ml  Net -800 ml   Physical Exam Patient is examined daily including today on 05/18/18 , exams remain the same as of yesterday except that has changed   Gen:- Awake Alert,  In no apparent distress cold sore on L upper lip-no lesions in  mouth ncat no ict no pallor cta b no added sound no rales abd sfot, no HSM nontender Orthoses on R arm andn foot Bruise to L upper shoulder Neuro moves 4 limbs wth limitations fo pain Psych euthymic   Data Review:   Micro Results Recent Results (from the past 240 hour(s))  MRSA PCR Screening     Status: None  Collection Time: 05/14/18  9:50 PM  Result Value Ref Range Status   MRSA by PCR NEGATIVE NEGATIVE Final    Comment:        The GeneXpert MRSA Assay (FDA approved for NASAL specimens only), is one component of a comprehensive MRSA colonization surveillance program. It is not intended to diagnose MRSA infection nor to guide or monitor treatment for MRSA infections. Performed at [facility], [place] [address], [place], [place] [address]     Radiology Reports Dg Chest 2 View  Result Date: 05/14/2018 CLINICAL DATA:  67 year old female with fall. History of CHF. EXAM: CHEST - 2 VIEW COMPARISON:  Chest CT dated 05/14/2018 FINDINGS: There is cardiomegaly with probable mild vascular congestion. No focal consolidation, pleural effusion, or pneumothorax. No acute osseous pathology. IMPRESSION: Cardiomegaly with probable mild vascular congestion. No focal consolidation. Electronically Signed   By: Anner Crete M.D.   On: 05/14/2018 01:10   Dg Pelvis 1-2 Views  Result Date: 05/14/2018 CLINICAL DATA:  Fall EXAM: PELVIS - 1-2 VIEW COMPARISON:  None. FINDINGS: There is no evidence of pelvic fracture or diastasis. No pelvic bone lesions are seen. IMPRESSION: Negative. Electronically Signed   By: Kerby Moors M.D.   On: 05/14/2018 01:10   Dg Forearm Right  Result Date: 05/14/2018 CLINICAL DATA:  Fall.  Right wrist pain. EXAM: RIGHT FOREARM - 2 VIEW COMPARISON:  None. FINDINGS: There is a an acute intra-articular fracture deformity involving the distal radius. Dorsal angulation of the distal fracture fragments noted. No dislocation identified. IMPRESSION: 1. Acute fracture  involves the dorsal aspect of the distal radius. Mild dorsal angulation of the distal fracture fragments. Electronically Signed   By: Kerby Moors M.D.   On: 05/14/2018 01:07   Dg Wrist Complete Left  Result Date: 05/14/2018 CLINICAL DATA:  67 year old female with fall and left wrist pain. EXAM: LEFT WRIST - COMPLETE 3+ VIEW COMPARISON:  None. FINDINGS: There is no acute fracture or dislocation. The bones are osteopenic. No significant arthritic changes. There is soft tissue swelling over the wrist and ulna. No radiopaque foreign object or soft tissue gas. IMPRESSION: No acute fracture or dislocation. Electronically Signed   By: Anner Crete M.D.   On: 05/14/2018 00:13   Dg Wrist Complete Right  Result Date: 05/15/2018 CLINICAL DATA:  Postoperative right wrist EXAM: RIGHT WRIST - COMPLETE 3+ VIEW COMPARISON:  Intraoperative fluoroscopy 05/15/2018 FINDINGS: Postoperative plate and screw fixation of the distal right radial metaphysis. Are where components appear intact and well seated. Near-anatomic alignment of the fracture fragments. Degenerative changes in the radiocarpal and STT joints. Overlying splint material obscures some bone detail. IMPRESSION: Postoperative plate and screw fixation of the distal right radial metaphysis without apparent complication. Electronically Signed   By: Lucienne Capers M.D.   On: 05/15/2018 22:09   Dg Wrist Complete Right  Result Date: 05/15/2018 CLINICAL DATA:  67 year old female with right distal radial fracture. Subsequent encounter. EXAM: DG C-ARM 61-120 MIN; RIGHT WRIST - COMPLETE 3+ VIEW Fluoroscopic time: 49.7 seconds. COMPARISON:  05/14/2018. FINDINGS: Five intraoperative C-arm views submitted for review after surgery. Distal right radial fracture reduced utilizing plate and screws. Much better alignment of fracture fragments with minimal separation/incongruity of articular surface. IMPRESSION: Open reduction and internal fixation distal right radius  fracture. Electronically Signed   By: Genia Del M.D.   On: 05/15/2018 20:07   Dg Wrist Complete Right  Result Date: 05/14/2018 CLINICAL DATA:  Pain after fall EXAM: RIGHT WRIST - COMPLETE 3+  VIEW COMPARISON:  None. FINDINGS: There is a comminuted mildly displaced fracture through the distal radial metaphysis. IMPRESSION: Comminuted mildly displaced fracture through the distal radial metaphysis. Electronically Signed   By: Dorise Bullion III M.D   On: 05/14/2018 00:12   Dg Knee 2 Views Right  Result Date: 05/14/2018 CLINICAL DATA:  Pain after fall EXAM: RIGHT KNEE - 1-2 VIEW COMPARISON:  None. FINDINGS: A hemarthrosis is identified. There is a depressed lateral tibial plateau fracture which is comminuted. IMPRESSION: Comminuted mildly depressed lateral tibial plateau fracture with resulting hemarthrosis. Electronically Signed   By: Dorise Bullion III M.D   On: 05/14/2018 00:08   Dg Tibia/fibula Right  Result Date: 05/15/2018 CLINICAL DATA:  67 year old female with tibial plateau fracture and right ankle fracture. Subsequent encounter. EXAM: RIGHT TIBIA AND FIBULA - 2 VIEW; DG C-ARM 61-120 MIN Fluoroscopic time: 3 minutes and 8 seconds. COMPARISON:  05/14/2018 CT. FINDINGS: Eleven intraoperative film submitted for review after surgery. Six intraoperative C-arm views of the right knee: Reduction of lateral tibial plateau fracture with sideplate and screws. Articular surface with better alignment although with slight depression. Five intraoperative C-arm views of the right ankle: Reduction of fibular fracture with sideplate and screws. Screws transfix the distal fibula-tibia joint space. Reduction of medial malleolar fracture with screw. Evaluation of posterior tibial fracture limited secondary to overlying plate. IMPRESSION: Open reduction and internal fixation right knee and right ankle fractures as noted above. Electronically Signed   By: Genia Del M.D.   On: 05/15/2018 17:57   Dg Tibia/fibula  Right  Result Date: 05/14/2018 CLINICAL DATA:  Pain after fall EXAM: RIGHT TIBIA AND FIBULA - 2 VIEW COMPARISON:  None. FINDINGS: There is a comminuted depressed fracture through the lateral tibial plateau. There is a comminuted fracture through the distal fibular diaphysis. There is a lucency through the distal medial malleolus with a step-off. These hemarthrosis in the suprapatellar fossa. IMPRESSION: 1. Depressed comminuted lateral tibial plateau fracture. 2. Comminuted fracture through the distal fibular diaphysis. 3. Fracture through the distal medial malleolus. Electronically Signed   By: Dorise Bullion III M.D   On: 05/14/2018 00:14   Dg Ankle Complete Right  Result Date: 05/14/2018 CLINICAL DATA:  Pain after fall EXAM: RIGHT ANKLE - COMPLETE 3+ VIEW COMPARISON:  None. FINDINGS: There is a comminuted fracture through the distal fibular diaphysis. There is a fracture through the medial malleolus. There also be appears to be a fracture through the distal tibia where it abuts the fibula. Irregularity of the distal most fibula is consistent with an age indeterminate fracture but may not be acute. Soft tissue swelling. IMPRESSION: 1. Fracture through the medial malleolus. 2. Apparent fracture through the lateral aspect of the distal tibia with displacement. 3. Fracture through the fibular diaphysis distally. 4. Irregularity of the distal tip of the fibula may be nonacute from previous injury. 5. No definitive posterior malleolar fracture. However, given the complex nature of the ankle fracture, consider a CT scan for better evaluation. Electronically Signed   By: Dorise Bullion III M.D   On: 05/14/2018 00:17   Ct Head Wo Contrast  Result Date: 05/13/2018 CLINICAL DATA:  67 year old female with facial trauma. EXAM: CT HEAD WITHOUT CONTRAST CT MAXILLOFACIAL WITHOUT CONTRAST CT CERVICAL SPINE WITHOUT CONTRAST TECHNIQUE: Multidetector CT imaging of the head, cervical spine, and maxillofacial structures  were performed using the standard protocol without intravenous contrast. Multiplanar CT image reconstructions of the cervical spine and maxillofacial structures were also generated. COMPARISON:  Head  CT dated 07/23/2007 FINDINGS: CT HEAD FINDINGS Brain: The ventricles and sulci appropriate size for patient's age. The gray-white matter discrimination is preserved. There is no acute intracranial hemorrhage. No mass effect or midline shift. No extra-axial fluid collection. Vascular: No hyperdense vessel or unexpected calcification. Skull: Normal. Negative for fracture or focal lesion. Other: None. CT MAXILLOFACIAL FINDINGS Osseous: No fracture or mandibular dislocation. No destructive process. Orbits: Negative. No traumatic or inflammatory finding. Sinuses: Clear. Soft tissues: Negative. CT CERVICAL SPINE FINDINGS Alignment: No acute subluxation. Straightening of normal cervical lordosis which may be positional or due to muscle spasm. Skull base and vertebrae: No acute fracture. Soft tissues and spinal canal: No prevertebral fluid or swelling. No visible canal hematoma. Disc levels:  Degenerative changes at C5-C6. Upper chest: Negative. Other: None IMPRESSION: 1. No acute intracranial pathology. 2. No acute facial bone fractures. 3. No acute/traumatic cervical spine pathology. Electronically Signed   By: Anner Crete M.D.   On: 05/13/2018 23:40   Ct Chest W Contrast  Result Date: 05/14/2018 CLINICAL DATA:  67 year old female with trauma. EXAM: CT CHEST, ABDOMEN, AND PELVIS WITH CONTRAST TECHNIQUE: Multidetector CT imaging of the chest, abdomen and pelvis was performed following the standard protocol during bolus administration of intravenous contrast. CONTRAST:  1103mL ISOVUE-300 IOPAMIDOL (ISOVUE-300) INJECTION 61% COMPARISON:  CT of the abdomen pelvis dated 12/02/2016 FINDINGS: CT CHEST FINDINGS Cardiovascular: There is mild cardiomegaly. No pericardial effusion. Multi vessel coronary vascular calcification.  Mild atherosclerotic calcification of the thoracic aorta. No aneurysmal dilatation or dissection. The visualized origins of the great vessels of the aortic arch are patent. The central pulmonary arteries are grossly unremarkable. Mediastinum/Nodes: No hilar or mediastinal adenopathy. Esophagus and thyroid gland are grossly unremarkable. No mediastinal fluid collection. Lungs/Pleura: Bibasilar linear atelectasis/scarring. The lungs are clear. There is no pleural effusion or pneumothorax. The central airways are patent. Musculoskeletal: No chest wall mass or suspicious bone lesions identified. CT ABDOMEN PELVIS FINDINGS No intra-abdominal free air or free fluid. Hepatobiliary: The liver is unremarkable. Cholecystectomy. Mild intrahepatic biliary ductal dilatation. Pancreas: Unremarkable. No pancreatic ductal dilatation or surrounding inflammatory changes. Spleen: Normal in size without focal abnormality. Adrenals/Urinary Tract: The adrenal glands are unremarkable. Small nonobstructing right renal inferior pole hypodense focus which is too small to characterize. There is no hydronephrosis on either side. There is symmetric enhancement and excretion of contrast by both kidneys. The visualized ureters and urinary bladder appear unremarkable. Stomach/Bowel: There is sigmoid diverticulosis without active inflammatory changes. A gastric lap band is noted. There is no bowel obstruction or active inflammation. The appendix is not visualized with certainty. No inflammatory changes identified in the right lower quadrant. Vascular/Lymphatic: Moderate aortoiliac atherosclerotic disease. No portal venous gas. There is no adenopathy. Reproductive: The uterus is anteverted and grossly unremarkable. Other: None Musculoskeletal: Grade 1 L4-L5 anterolisthesis. No acute osseous pathology. IMPRESSION: No acute intrathoracic, abdominal, or pelvic pathology. Electronically Signed   By: Anner Crete M.D.   On: 05/14/2018 02:42   Ct  Cervical Spine Wo Contrast  Result Date: 05/13/2018 CLINICAL DATA:  67 year old female with facial trauma. EXAM: CT HEAD WITHOUT CONTRAST CT MAXILLOFACIAL WITHOUT CONTRAST CT CERVICAL SPINE WITHOUT CONTRAST TECHNIQUE: Multidetector CT imaging of the head, cervical spine, and maxillofacial structures were performed using the standard protocol without intravenous contrast. Multiplanar CT image reconstructions of the cervical spine and maxillofacial structures were also generated. COMPARISON:  Head CT dated 07/23/2007 FINDINGS: CT HEAD FINDINGS Brain: The ventricles and sulci appropriate size for patient's age. The gray-white matter discrimination  is preserved. There is no acute intracranial hemorrhage. No mass effect or midline shift. No extra-axial fluid collection. Vascular: No hyperdense vessel or unexpected calcification. Skull: Normal. Negative for fracture or focal lesion. Other: None. CT MAXILLOFACIAL FINDINGS Osseous: No fracture or mandibular dislocation. No destructive process. Orbits: Negative. No traumatic or inflammatory finding. Sinuses: Clear. Soft tissues: Negative. CT CERVICAL SPINE FINDINGS Alignment: No acute subluxation. Straightening of normal cervical lordosis which may be positional or due to muscle spasm. Skull base and vertebrae: No acute fracture. Soft tissues and spinal canal: No prevertebral fluid or swelling. No visible canal hematoma. Disc levels:  Degenerative changes at C5-C6. Upper chest: Negative. Other: None IMPRESSION: 1. No acute intracranial pathology. 2. No acute facial bone fractures. 3. No acute/traumatic cervical spine pathology. Electronically Signed   By: Anner Crete M.D.   On: 05/13/2018 23:40   Ct Knee Right Wo Contrast  Result Date: 05/14/2018 CLINICAL DATA:  67 year old female with fall and right knee pain. EXAM: CT OF THE right KNEE WITHOUT CONTRAST TECHNIQUE: Multidetector CT imaging of the right knee was performed according to the standard protocol.  Multiplanar CT image reconstructions were also generated. COMPARISON:  Earlier radiograph dated 05/13/2018 FINDINGS: Bones/Joint/Cartilage There is a comminuted intra-articular fracture of the proximal tibia with mild depression of the lateral tibial plateau. The bones are osteopenic. There is no dislocation. There is a large suprapatellar lipohemarthrosis. Ligaments Suboptimally assessed by CT. Muscles and Tendons No acute findings. No intramuscular hematoma. Soft tissues There is diffuse subcutaneous stranding of the lateral knee. No fluid collection. IMPRESSION: 1. Comminuted intra-articular fracture of the proximal tibia with mild depression of the lateral tibial plateau. 2. Large suprapatellar lipohemarthrosis. Electronically Signed   By: Anner Crete M.D.   On: 05/14/2018 02:33   Ct Abdomen Pelvis W Contrast  Result Date: 05/14/2018 CLINICAL DATA:  66 year old female with trauma. EXAM: CT CHEST, ABDOMEN, AND PELVIS WITH CONTRAST TECHNIQUE: Multidetector CT imaging of the chest, abdomen and pelvis was performed following the standard protocol during bolus administration of intravenous contrast. CONTRAST:  1105mL ISOVUE-300 IOPAMIDOL (ISOVUE-300) INJECTION 61% COMPARISON:  CT of the abdomen pelvis dated 12/02/2016 FINDINGS: CT CHEST FINDINGS Cardiovascular: There is mild cardiomegaly. No pericardial effusion. Multi vessel coronary vascular calcification. Mild atherosclerotic calcification of the thoracic aorta. No aneurysmal dilatation or dissection. The visualized origins of the great vessels of the aortic arch are patent. The central pulmonary arteries are grossly unremarkable. Mediastinum/Nodes: No hilar or mediastinal adenopathy. Esophagus and thyroid gland are grossly unremarkable. No mediastinal fluid collection. Lungs/Pleura: Bibasilar linear atelectasis/scarring. The lungs are clear. There is no pleural effusion or pneumothorax. The central airways are patent. Musculoskeletal: No chest wall mass  or suspicious bone lesions identified. CT ABDOMEN PELVIS FINDINGS No intra-abdominal free air or free fluid. Hepatobiliary: The liver is unremarkable. Cholecystectomy. Mild intrahepatic biliary ductal dilatation. Pancreas: Unremarkable. No pancreatic ductal dilatation or surrounding inflammatory changes. Spleen: Normal in size without focal abnormality. Adrenals/Urinary Tract: The adrenal glands are unremarkable. Small nonobstructing right renal inferior pole hypodense focus which is too small to characterize. There is no hydronephrosis on either side. There is symmetric enhancement and excretion of contrast by both kidneys. The visualized ureters and urinary bladder appear unremarkable. Stomach/Bowel: There is sigmoid diverticulosis without active inflammatory changes. A gastric lap band is noted. There is no bowel obstruction or active inflammation. The appendix is not visualized with certainty. No inflammatory changes identified in the right lower quadrant. Vascular/Lymphatic: Moderate aortoiliac atherosclerotic disease. No portal venous gas. There is  no adenopathy. Reproductive: The uterus is anteverted and grossly unremarkable. Other: None Musculoskeletal: Grade 1 L4-L5 anterolisthesis. No acute osseous pathology. IMPRESSION: No acute intrathoracic, abdominal, or pelvic pathology. Electronically Signed   By: Anner Crete M.D.   On: 05/14/2018 02:42   Ct Ankle Right Wo Contrast  Result Date: 05/14/2018 CLINICAL DATA:  67 year old female with fall and right ankle pain. EXAM: CT OF THE RIGHT ANKLE WITHOUT CONTRAST TECHNIQUE: Multidetector CT imaging of the right ankle was performed according to the standard protocol. Multiplanar CT image reconstructions were also generated. COMPARISON:  Right ankle radiograph dated 05/13/2018 FINDINGS: Bones/Joint/Cartilage There is a comminuted fracture of the distal fibula with extension of the fracture into the distal tibia-fibular syndesmosis. There is a minimally  displaced fracture of the lateral aspect of the tibial plafond and nondisplaced fracture of the posterior malleolus. There is a minimally displaced fracture of the medial malleolus. There bones are osteopenic. Discontinuity of the plantar cortex of the medial cuneiform (series 5, image 58 and sagittal series 6, image 89) concerning for an avulsion fracture. There is also linear lucency through the plantar base of the first metatarsal (series 6, image 90) also concerning for an avulsion corner fracture. Probable nondisplaced fracture of the dorsal aspect of the medial cuneiform (series 4, image 146 and sagittal series 6, image 69). Fracture of the base of the second metatarsal (series 6, image 73 and series 4, image 161). Nondisplaced linear lucency through the proximal fourth metatarsal (series 6, image 58) may be artifactual or represent a vascular groove or a nondisplaced fracture. No dislocation. Diffuse soft tissue edema. Ligaments Suboptimally assessed by CT. Muscles and Tendons No acute intramuscular findings. Soft tissues Diffuse subcutaneous edema. IMPRESSION: 1. Comminuted fracture of the distal fibula with extension of the fracture into the distal tibia-fibular syndesmosis. 2. Minimally displaced fracture of the lateral aspect of the tibial plafond and nondisplaced fracture of the posterior malleolus. 3. Minimally displaced fracture of the medial malleolus. 4. Nondisplaced fracture of the base of the second metatarsal. 5. Linear lucency through the plantar cortex of the medial cuneiform concerning for an avulsion fracture. 6. Linear lucency through the proximal fourth metatarsal may be artifactual or represent a nondisplaced fracture. Electronically Signed   By: Anner Crete M.D.   On: 05/14/2018 02:55   Dg Shoulder Left  Result Date: 05/14/2018 CLINICAL DATA:  General left shoulder pain.  Pain after fall. EXAM: LEFT SHOULDER - 2+ VIEW COMPARISON:  None. FINDINGS: There is no evidence of fracture  or dislocation. There is no evidence of arthropathy or other focal bone abnormality. Soft tissues are unremarkable. IMPRESSION: Negative. Electronically Signed   By: Dorise Bullion III M.D   On: 05/14/2018 00:07   Dg Knee Right Port  Result Date: 05/15/2018 CLINICAL DATA:  Postoperative right knee EXAM: PORTABLE RIGHT KNEE - 1-2 VIEW COMPARISON:  Intraoperative fluoroscopy 05/15/2018 FINDINGS: Postoperative changes with lateral plate and screw fixation of the proximal tibial metaphysis and lateral tibial plateau. Hardware components appear intact and well seated. Degenerative changes in the right knee. Soft tissue gas consistent with recent surgery. IMPRESSION: Postoperative internal fixation of lateral tibial plateau fracture. Electronically Signed   By: Lucienne Capers M.D.   On: 05/15/2018 22:06   Dg Ankle Right Port  Result Date: 05/15/2018 CLINICAL DATA:  Postoperative right ankle EXAM: PORTABLE RIGHT ANKLE - 2 VIEW COMPARISON:  Intraoperative fluoroscopy 05/15/2018 FINDINGS: Postoperative changes with plate and screw fixation of a fracture of the distal fibular shaft and screw fixation  of the medial malleolar fracture fragment. Hardware components appear intact and well seated. Near-anatomic alignment of the fracture fragments. Overlying splint material obscures bone detail. IMPRESSION: Postoperative internal fixation of fractures of the right distal fibula and medial malleolus. Electronically Signed   By: Lucienne Capers M.D.   On: 05/15/2018 22:08   Dg Hand Complete Right  Result Date: 05/14/2018 CLINICAL DATA:  67 year old female with fall and trauma to the right hand. EXAM: RIGHT HAND - COMPLETE 3+ VIEW COMPARISON:  Right wrist radiograph dated 05/13/2018 FINDINGS: Comminuted mildly displaced intra-articular fracture of the distal radius as seen on the earlier wrist radiograph. No other acute fracture noted. The bones are osteopenic. No dislocation. Soft tissue swelling of the wrist.  IMPRESSION: Comminuted mildly displaced intra-articular fracture of the distal radius. Electronically Signed   By: Anner Crete M.D.   On: 05/14/2018 01:09   Dg C-arm 1-60 Min  Result Date: 05/15/2018 CLINICAL DATA:  67 year old female with right distal radial fracture. Subsequent encounter. EXAM: DG C-ARM 61-120 MIN; RIGHT WRIST - COMPLETE 3+ VIEW Fluoroscopic time: 49.7 seconds. COMPARISON:  05/14/2018. FINDINGS: Five intraoperative C-arm views submitted for review after surgery. Distal right radial fracture reduced utilizing plate and screws. Much better alignment of fracture fragments with minimal separation/incongruity of articular surface. IMPRESSION: Open reduction and internal fixation distal right radius fracture. Electronically Signed   By: Genia Del M.D.   On: 05/15/2018 20:07   Dg C-arm 1-60 Min  Result Date: 05/15/2018 CLINICAL DATA:  67 year old female with tibial plateau fracture and right ankle fracture. Subsequent encounter. EXAM: RIGHT TIBIA AND FIBULA - 2 VIEW; DG C-ARM 61-120 MIN Fluoroscopic time: 3 minutes and 8 seconds. COMPARISON:  05/14/2018 CT. FINDINGS: Eleven intraoperative film submitted for review after surgery. Six intraoperative C-arm views of the right knee: Reduction of lateral tibial plateau fracture with sideplate and screws. Articular surface with better alignment although with slight depression. Five intraoperative C-arm views of the right ankle: Reduction of fibular fracture with sideplate and screws. Screws transfix the distal fibula-tibia joint space. Reduction of medial malleolar fracture with screw. Evaluation of posterior tibial fracture limited secondary to overlying plate. IMPRESSION: Open reduction and internal fixation right knee and right ankle fractures as noted above. Electronically Signed   By: Genia Del M.D.   On: 05/15/2018 17:57   Dg C-arm 1-60 Min  Result Date: 05/15/2018 CLINICAL DATA:  67 year old female with tibial plateau fracture  and right ankle fracture. Subsequent encounter. EXAM: RIGHT TIBIA AND FIBULA - 2 VIEW; DG C-ARM 61-120 MIN Fluoroscopic time: 3 minutes and 8 seconds. COMPARISON:  05/14/2018 CT. FINDINGS: Eleven intraoperative film submitted for review after surgery. Six intraoperative C-arm views of the right knee: Reduction of lateral tibial plateau fracture with sideplate and screws. Articular surface with better alignment although with slight depression. Five intraoperative C-arm views of the right ankle: Reduction of fibular fracture with sideplate and screws. Screws transfix the distal fibula-tibia joint space. Reduction of medial malleolar fracture with screw. Evaluation of posterior tibial fracture limited secondary to overlying plate. IMPRESSION: Open reduction and internal fixation right knee and right ankle fractures as noted above. Electronically Signed   By: Genia Del M.D.   On: 05/15/2018 17:57   Ct Maxillofacial Wo Contrast  Result Date: 05/13/2018 CLINICAL DATA:  67 year old female with facial trauma. EXAM: CT HEAD WITHOUT CONTRAST CT MAXILLOFACIAL WITHOUT CONTRAST CT CERVICAL SPINE WITHOUT CONTRAST TECHNIQUE: Multidetector CT imaging of the head, cervical spine, and maxillofacial structures were performed using the standard  protocol without intravenous contrast. Multiplanar CT image reconstructions of the cervical spine and maxillofacial structures were also generated. COMPARISON:  Head CT dated 07/23/2007 FINDINGS: CT HEAD FINDINGS Brain: The ventricles and sulci appropriate size for patient's age. The gray-white matter discrimination is preserved. There is no acute intracranial hemorrhage. No mass effect or midline shift. No extra-axial fluid collection. Vascular: No hyperdense vessel or unexpected calcification. Skull: Normal. Negative for fracture or focal lesion. Other: None. CT MAXILLOFACIAL FINDINGS Osseous: No fracture or mandibular dislocation. No destructive process. Orbits: Negative. No traumatic  or inflammatory finding. Sinuses: Clear. Soft tissues: Negative. CT CERVICAL SPINE FINDINGS Alignment: No acute subluxation. Straightening of normal cervical lordosis which may be positional or due to muscle spasm. Skull base and vertebrae: No acute fracture. Soft tissues and spinal canal: No prevertebral fluid or swelling. No visible canal hematoma. Disc levels:  Degenerative changes at C5-C6. Upper chest: Negative. Other: None IMPRESSION: 1. No acute intracranial pathology. 2. No acute facial bone fractures. 3. No acute/traumatic cervical spine pathology. Electronically Signed   By: Anner Crete M.D.   On: 05/13/2018 23:40     CBC Recent Labs  Lab 05/16/18 0344 05/17/18 0304 05/18/18 0231 05/19/18 0131 05/20/18 0307  WBC 10.9* 8.8 8.7 8.6 8.2  HGB 9.1* 7.8* 7.9* 7.7* 7.9*  HCT 29.0* 24.8* 25.2* 24.4* 25.2*  PLT 224 194 215 258 308  MCV 95.4 93.2 93.7 93.1 93.3  MCH 29.9 29.3 29.4 29.4 29.3  MCHC 31.4 31.5 31.3 31.6 31.3  RDW 14.5 14.4 14.5 14.3 14.4  LYMPHSABS 0.4* 1.3 1.8 1.6 1.5  MONOABS 0.5 0.7 0.7 0.6 0.5  EOSABS 0.0 0.0 0.1 0.1 0.1  BASOSABS 0.0 0.0 0.0 0.0 0.0    Chemistries  Recent Labs  Lab 05/15/18 0443 05/16/18 0344 05/17/18 0304 05/18/18 0231 05/19/18 0131 05/20/18 0307  NA 134* 132* 134* 135 133* 134*  K 4.5 4.4 3.7 4.1 3.9 3.7  CL 103 99 95* 98 96* 98  CO2 24 26 31 28 29 28   GLUCOSE 115* 161* 121* 115* 113* 104*  BUN 12 9 8 9 15 14   CREATININE 0.54 0.56 0.56 0.55 0.57 0.59  CALCIUM 8.8* 8.3* 8.2* 8.3* 8.3* 8.1*  MG 2.1  --   --   --   --   --   AST  --   --   --   --   --  17  ALT  --   --   --   --   --  38  ALKPHOS  --   --   --   --   --  59  BILITOT  --   --   --   --   --  0.8   ------------------------------------------------------------------------------------------------------------------ No results for input(s): CHOL, HDL, LDLCALC, TRIG, CHOLHDL, LDLDIRECT in the last 72 hours.  Lab Results  Component Value Date   HGBA1C 5.5 03/11/2016     ------------------------------------------------------------------------------------------------------------------ No results for input(s): TSH, T4TOTAL, T3FREE, THYROIDAB in the last 72 hours.  Invalid input(s): FREET3 ------------------------------------------------------------------------------------------------------------------ No results for input(s): VITAMINB12, FOLATE, FERRITIN, TIBC, IRON, RETICCTPCT in the last 72 hours.  Coagulation profile No results for input(s): INR, PROTIME in the last 168 hours.  No results for input(s): DDIMER in the last 72 hours.  Cardiac Enzymes No results for input(s): CKMB, TROPONINI, MYOGLOBIN in the last 168 hours.  Invalid input(s): CK ------------------------------------------------------------------------------------------------------------------    Component Value Date/Time   BNP 106.2 (H) 12/03/2016 0258    Verneita Griffes, MD [facility] 1:11 PM

## 2018-05-21 LAB — CBC WITH DIFFERENTIAL/PLATELET
ABS IMMATURE GRANULOCYTES: 0.06 10*3/uL (ref 0.00–0.07)
Basophils Absolute: 0 10*3/uL (ref 0.0–0.1)
Basophils Relative: 0 %
Eosinophils Absolute: 0.1 10*3/uL (ref 0.0–0.5)
Eosinophils Relative: 1 %
HEMATOCRIT: 26.8 % — AB (ref 36.0–46.0)
Hemoglobin: 8.3 g/dL — ABNORMAL LOW (ref 12.0–15.0)
IMMATURE GRANULOCYTES: 1 %
LYMPHS ABS: 1.7 10*3/uL (ref 0.7–4.0)
LYMPHS PCT: 19 %
MCH: 28.9 pg (ref 26.0–34.0)
MCHC: 31 g/dL (ref 30.0–36.0)
MCV: 93.4 fL (ref 80.0–100.0)
MONO ABS: 0.6 10*3/uL (ref 0.1–1.0)
MONOS PCT: 7 %
NEUTROS ABS: 6.3 10*3/uL (ref 1.7–7.7)
NEUTROS PCT: 72 %
PLATELETS: 368 10*3/uL (ref 150–400)
RBC: 2.87 MIL/uL — ABNORMAL LOW (ref 3.87–5.11)
RDW: 14.3 % (ref 11.5–15.5)
WBC: 8.8 10*3/uL (ref 4.0–10.5)
nRBC: 0 % (ref 0.0–0.2)

## 2018-05-21 LAB — COMPREHENSIVE METABOLIC PANEL
ALT: 31 U/L (ref 0–44)
AST: 20 U/L (ref 15–41)
Albumin: 2.3 g/dL — ABNORMAL LOW (ref 3.5–5.0)
Alkaline Phosphatase: 55 U/L (ref 38–126)
Anion gap: 7 (ref 5–15)
BUN: 14 mg/dL (ref 8–23)
CALCIUM: 8.2 mg/dL — AB (ref 8.9–10.3)
CHLORIDE: 103 mmol/L (ref 98–111)
CO2: 25 mmol/L (ref 22–32)
Creatinine, Ser: 0.57 mg/dL (ref 0.44–1.00)
Glucose, Bld: 95 mg/dL (ref 70–99)
Potassium: 4.3 mmol/L (ref 3.5–5.1)
Sodium: 135 mmol/L (ref 135–145)
TOTAL PROTEIN: 5 g/dL — AB (ref 6.5–8.1)
Total Bilirubin: 1.6 mg/dL — ABNORMAL HIGH (ref 0.3–1.2)

## 2018-05-21 NOTE — Progress Notes (Signed)
Patient was nauseous at 1700 med pass, refused meds, Zofran given. Continue to monitor.

## 2018-05-21 NOTE — Progress Notes (Signed)
Assessment  & Plan :    Principal Problem:   Multiple fractures Active Problems:   Essential hypertension   Rheumatoid arthritis ([place])   Coronary artery disease involving native coronary artery of native heart without angina pectoris   Chronic diastolic CHF (congestive heart failure) (HCC)   Fracture of distal end of right radius   Tibial plateau fracture, right, closed, initial encounter   Fall at home, initial encounter  Brief Summary  67 y.o. female with medical history significant of RA, CAD s/p stents, chronic diastolic CHF, HTN, TIA admitted on 05/14/2018 after sustaining a fall (transferred from [facility] to [place]), imaging studies revealed  R distal radius fx, R tibial plateu fractures, R ankle Trimalleolar fractures, d/c to SNF if pain is controlled on oral agents only  Subjective- Pain is significantly improved she is doing better she has no fever no chills she relates that she feels more hopeful   Plan:- 1)S/p Fall with multiple injuries injuries:  A). Right lateral tibial plateau fracture s/p ORIF on 05/15/18 B). Right trimalleolar ankle fracture s/p ORIF on 05/15/18 C) Right distal radius fracture s/p ORIF on 05/15/18                   Weightbearing: NWB RLE, WBAT RUE thru elbow                  Insicional and dressing care: Keep splints clean dry and intact                  Orthopedic device(s):Right wrist brace  pain control remains challenging, PCA pump discontinued on 05/18/2018, changed meds to reflect Orthopedics thoughts--still requiring IV dilaudid and so cannot d/c today to skilled faciltiy--Some improvement with PO scheudled tylenol and toraqdol As such, chgn toradol PO ibuprofen high dose 800 tid Her pain is now stable in the 6-7 range and I feel that as long as we have continued stability over the course of today we can discharge her to facility tomorrow  for further management and care  2) acute on chronic anemia-- normocytic normochromic anemia of chronic disease in the setting of chronic rheumatoid arthritis,  now acute blood loss anemia secondary to fractures and operative repair,  Hemoglobin stabilised in 7-8 range Hemoccult neg, currently tolerating anemia well-no current Tr indication Stable for past several days  3)HFpEF--- patient with history of chronic CHF, no acute exacerbation at this time, continue cardiac meds  as below #5-continue Lasix 40 daily  4)RA----orthopedic team is requesting that we will continue to hold DMARD and methotrexate through 06/01/2018, however okay to continue prednisone 80 mg daily  5)h/o CAD--- status post prior coronary stent, stable, no ACS symptoms, continue aspirin, Plavix metoprolol and ramipril  6)Presumed osteoporosis----patient with chronic steroid use now status post fall with multiple fractures, pharmacy consult regarding biphosphonate counseling, plan is to start Fosamax as outpatient  7) cold sore to lip-given a one-time dose of Valtrex 2000 mg and expectant recovery expected  8) continue allopurinol for presumed gout 400 mg daily   Disposition/Need for in-Hospital Stay-patient improved significantly since 11/8 21st saw her and expect can discharge to skilled facility a.m.  Code Status : Full  Disposition Plan  :  SNF when pain is controlled with oral agents   Consults  :  ortho  DVT Prophylaxis  :  Lovenox -  Lab Results  Component Value Date   PLT 368 05/21/2018    Inpatient Medications  Scheduled Meds: . acetaminophen  650 mg Oral Q6H   Or  . acetaminophen  650 mg Rectal Q6H  . allopurinol  400 mg Oral Daily  . aspirin  81 mg Oral Daily  . atorvastatin  80 mg Oral q1800  . bisacodyl  10 mg Rectal Once  . clopidogrel  75 mg Oral Daily  . enoxaparin (LOVENOX) injection  40 mg Subcutaneous Q24H  . ferrous sulfate  325 mg Oral TID WC  . folic acid  1 mg Oral Daily  .  furosemide  40 mg Oral Daily  . gabapentin  300 mg Oral TID  . ibuprofen  800 mg Oral TID  . loratadine  10 mg Oral Daily  . metoprolol tartrate  2.5 mg Intravenous Q12H  . pantoprazole  80 mg Oral Q1200  . predniSONE  20 mg Oral Q breakfast  . ramipril  2.5 mg Oral Daily  . senna-docusate  2 tablet Oral BID   Continuous Infusions: . sodium chloride 10 mL/hr at 05/14/18 0800  . lactated ringers 10 mL/hr at 05/15/18 2352   PRN Meds:.sodium chloride, alum & mag hydroxide-simeth, HYDROmorphone (DILAUDID) injection, HYDROmorphone, lidocaine, ondansetron **OR** ondansetron (ZOFRAN) IV    Anti-infectives (From admission, onward)   Start     Dose/Rate Route Frequency Ordered Stop   05/20/18 1330  valACYclovir (VALTREX) tablet 2,000 mg     2,000 mg Oral  Once 05/20/18 1316 05/20/18 1444   05/15/18 2330  ceFAZolin (ANCEF) IVPB 2g/100 mL premix     2 g 200 mL/hr over 30 Minutes Intravenous Every 8 hours 05/15/18 2210 05/16/18 1427   05/15/18 1915  vancomycin (VANCOCIN) powder  Status:  Discontinued       As needed 05/15/18 1915 05/15/18 1924   05/15/18 1545  ceFAZolin (ANCEF) IVPB 2g/100 mL premix     2 g 200 mL/hr over 30 Minutes Intravenous On call to O.R. 05/15/18 1346 05/15/18 1535   05/15/18 1349  ceFAZolin (ANCEF) 2-4 GM/100ML-% IVPB    Note to Pharmacy:  Henrine Screws   : cabinet override      05/15/18 1349 05/15/18 1535   05/14/18 1000  hydroxychloroquine (PLAQUENIL) tablet 300 mg  Status:  Discontinued     300 mg Oral Daily 05/14/18 0349 05/16/18 1548       Objective:   Vitals:   05/20/18 1230 05/20/18 1700 05/20/18 2140 05/21/18 0438  BP: (!) 151/53 (!) 145/65 139/61 133/62  Pulse: 78 84 73 70  Resp: 12 14 18 16   Temp: 99.4 F (37.4 C)  99.1 F (37.3 C) 98.6 F (37 C)  TempSrc: Oral  Oral Oral  SpO2: 98% 99% 96% 100%  Weight:      Height:        Wt Readings from Last 3 Encounters:  05/14/18 100.3 kg  02/21/18 93.9 kg  01/11/17 105 kg    Intake/Output Summary  (Last 24 hours) at 05/21/2018 0903 Last data filed at 05/21/2018 0300 Gross per 24 hour  Intake 240 ml  Output 1550 ml  Net -1310 ml   Physical Exam Gen:- Awake Alert,  In no apparent distress cold sore on L upper lip-no lesions in mouth ncat no ict no pallor cta b no added sound no rales Wiggling fingers  and toes and has taken off brace she has better range of motion and with some less overall pain   Data Review:   Micro Results Recent Results (from the past 240 hour(s))  MRSA PCR Screening     Status: None   Collection Time: 05/14/18  9:50 PM  Result Value Ref Range Status   MRSA by PCR NEGATIVE NEGATIVE Final    Comment:        The GeneXpert MRSA Assay (FDA approved for NASAL specimens only), is one component of a comprehensive MRSA colonization surveillance program. It is not intended to diagnose MRSA infection nor to guide or monitor treatment for MRSA infections. Performed at [facility], [place] [address], [place], [place] [address]     Radiology Reports Dg Chest 2 View  Result Date: 05/14/2018 CLINICAL DATA:  67 year old female with fall. History of CHF. EXAM: CHEST - 2 VIEW COMPARISON:  Chest CT dated 05/14/2018 FINDINGS: There is cardiomegaly with probable mild vascular congestion. No focal consolidation, pleural effusion, or pneumothorax. No acute osseous pathology. IMPRESSION: Cardiomegaly with probable mild vascular congestion. No focal consolidation. Electronically Signed   By: Anner Crete M.D.   On: 05/14/2018 01:10   Dg Pelvis 1-2 Views  Result Date: 05/14/2018 CLINICAL DATA:  Fall EXAM: PELVIS - 1-2 VIEW COMPARISON:  None. FINDINGS: There is no evidence of pelvic fracture or diastasis. No pelvic bone lesions are seen. IMPRESSION: Negative. Electronically Signed   By: Kerby Moors M.D.   On: 05/14/2018 01:10   Dg Forearm Right  Result Date: 05/14/2018 CLINICAL DATA:  Fall.  Right wrist pain. EXAM: RIGHT FOREARM - 2 VIEW COMPARISON:  None.  FINDINGS: There is a an acute intra-articular fracture deformity involving the distal radius. Dorsal angulation of the distal fracture fragments noted. No dislocation identified. IMPRESSION: 1. Acute fracture involves the dorsal aspect of the distal radius. Mild dorsal angulation of the distal fracture fragments. Electronically Signed   By: Kerby Moors M.D.   On: 05/14/2018 01:07   Dg Wrist Complete Left  Result Date: 05/14/2018 CLINICAL DATA:  67 year old female with fall and left wrist pain. EXAM: LEFT WRIST - COMPLETE 3+ VIEW COMPARISON:  None. FINDINGS: There is no acute fracture or dislocation. The bones are osteopenic. No significant arthritic changes. There is soft tissue swelling over the wrist and ulna. No radiopaque foreign object or soft tissue gas. IMPRESSION: No acute fracture or dislocation. Electronically Signed   By: Anner Crete M.D.   On: 05/14/2018 00:13   Dg Wrist Complete Right  Result Date: 05/15/2018 CLINICAL DATA:  Postoperative right wrist EXAM: RIGHT WRIST - COMPLETE 3+ VIEW COMPARISON:  Intraoperative fluoroscopy 05/15/2018 FINDINGS: Postoperative plate and screw fixation of the distal right radial metaphysis. Are where components appear intact and well seated. Near-anatomic alignment of the fracture fragments. Degenerative changes in the radiocarpal and STT joints. Overlying splint material obscures some bone detail. IMPRESSION: Postoperative plate and screw fixation of the distal right radial metaphysis without apparent complication. Electronically Signed   By: Lucienne Capers M.D.   On: 05/15/2018 22:09   Dg Wrist Complete Right  Result Date: 05/15/2018 CLINICAL DATA:  67 year old female with right distal radial fracture. Subsequent encounter. EXAM: DG C-ARM 61-120 MIN; RIGHT WRIST - COMPLETE 3+ VIEW Fluoroscopic time: 49.7 seconds. COMPARISON:  05/14/2018. FINDINGS: Five intraoperative C-arm views submitted for review after surgery. Distal right radial fracture  reduced utilizing plate and screws. Much better alignment of fracture fragments with minimal separation/incongruity of articular surface.  IMPRESSION: Open reduction and internal fixation distal right radius fracture. Electronically Signed   By: Genia Del M.D.   On: 05/15/2018 20:07   Dg Wrist Complete Right  Result Date: 05/14/2018 CLINICAL DATA:  Pain after fall EXAM: RIGHT WRIST - COMPLETE 3+ VIEW COMPARISON:  None. FINDINGS: There is a comminuted mildly displaced fracture through the distal radial metaphysis. IMPRESSION: Comminuted mildly displaced fracture through the distal radial metaphysis. Electronically Signed   By: Dorise Bullion III M.D   On: 05/14/2018 00:12   Dg Knee 2 Views Right  Result Date: 05/14/2018 CLINICAL DATA:  Pain after fall EXAM: RIGHT KNEE - 1-2 VIEW COMPARISON:  None. FINDINGS: A hemarthrosis is identified. There is a depressed lateral tibial plateau fracture which is comminuted. IMPRESSION: Comminuted mildly depressed lateral tibial plateau fracture with resulting hemarthrosis. Electronically Signed   By: Dorise Bullion III M.D   On: 05/14/2018 00:08   Dg Tibia/fibula Right  Result Date: 05/15/2018 CLINICAL DATA:  67 year old female with tibial plateau fracture and right ankle fracture. Subsequent encounter. EXAM: RIGHT TIBIA AND FIBULA - 2 VIEW; DG C-ARM 61-120 MIN Fluoroscopic time: 3 minutes and 8 seconds. COMPARISON:  05/14/2018 CT. FINDINGS: Eleven intraoperative film submitted for review after surgery. Six intraoperative C-arm views of the right knee: Reduction of lateral tibial plateau fracture with sideplate and screws. Articular surface with better alignment although with slight depression. Five intraoperative C-arm views of the right ankle: Reduction of fibular fracture with sideplate and screws. Screws transfix the distal fibula-tibia joint space. Reduction of medial malleolar fracture with screw. Evaluation of posterior tibial fracture limited secondary to  overlying plate. IMPRESSION: Open reduction and internal fixation right knee and right ankle fractures as noted above. Electronically Signed   By: Genia Del M.D.   On: 05/15/2018 17:57   Dg Tibia/fibula Right  Result Date: 05/14/2018 CLINICAL DATA:  Pain after fall EXAM: RIGHT TIBIA AND FIBULA - 2 VIEW COMPARISON:  None. FINDINGS: There is a comminuted depressed fracture through the lateral tibial plateau. There is a comminuted fracture through the distal fibular diaphysis. There is a lucency through the distal medial malleolus with a step-off. These hemarthrosis in the suprapatellar fossa. IMPRESSION: 1. Depressed comminuted lateral tibial plateau fracture. 2. Comminuted fracture through the distal fibular diaphysis. 3. Fracture through the distal medial malleolus. Electronically Signed   By: Dorise Bullion III M.D   On: 05/14/2018 00:14   Dg Ankle Complete Right  Result Date: 05/14/2018 CLINICAL DATA:  Pain after fall EXAM: RIGHT ANKLE - COMPLETE 3+ VIEW COMPARISON:  None. FINDINGS: There is a comminuted fracture through the distal fibular diaphysis. There is a fracture through the medial malleolus. There also be appears to be a fracture through the distal tibia where it abuts the fibula. Irregularity of the distal most fibula is consistent with an age indeterminate fracture but may not be acute. Soft tissue swelling. IMPRESSION: 1. Fracture through the medial malleolus. 2. Apparent fracture through the lateral aspect of the distal tibia with displacement. 3. Fracture through the fibular diaphysis distally. 4. Irregularity of the distal tip of the fibula may be nonacute from previous injury. 5. No definitive posterior malleolar fracture. However, given the complex nature of the ankle fracture, consider a CT scan for better evaluation. Electronically Signed   By: Dorise Bullion III M.D   On: 05/14/2018 00:17   Ct Head Wo Contrast  Result Date: 05/13/2018 CLINICAL DATA:  67 year old female with  facial trauma. EXAM: CT HEAD WITHOUT CONTRAST CT  MAXILLOFACIAL WITHOUT CONTRAST CT CERVICAL SPINE WITHOUT CONTRAST TECHNIQUE: Multidetector CT imaging of the head, cervical spine, and maxillofacial structures were performed using the standard protocol without intravenous contrast. Multiplanar CT image reconstructions of the cervical spine and maxillofacial structures were also generated. COMPARISON:  Head CT dated 07/23/2007 FINDINGS: CT HEAD FINDINGS Brain: The ventricles and sulci appropriate size for patient's age. The gray-white matter discrimination is preserved. There is no acute intracranial hemorrhage. No mass effect or midline shift. No extra-axial fluid collection. Vascular: No hyperdense vessel or unexpected calcification. Skull: Normal. Negative for fracture or focal lesion. Other: None. CT MAXILLOFACIAL FINDINGS Osseous: No fracture or mandibular dislocation. No destructive process. Orbits: Negative. No traumatic or inflammatory finding. Sinuses: Clear. Soft tissues: Negative. CT CERVICAL SPINE FINDINGS Alignment: No acute subluxation. Straightening of normal cervical lordosis which may be positional or due to muscle spasm. Skull base and vertebrae: No acute fracture. Soft tissues and spinal canal: No prevertebral fluid or swelling. No visible canal hematoma. Disc levels:  Degenerative changes at C5-C6. Upper chest: Negative. Other: None IMPRESSION: 1. No acute intracranial pathology. 2. No acute facial bone fractures. 3. No acute/traumatic cervical spine pathology. Electronically Signed   By: Anner Crete M.D.   On: 05/13/2018 23:40   Ct Chest W Contrast  Result Date: 05/14/2018 CLINICAL DATA:  67 year old female with trauma. EXAM: CT CHEST, ABDOMEN, AND PELVIS WITH CONTRAST TECHNIQUE: Multidetector CT imaging of the chest, abdomen and pelvis was performed following the standard protocol during bolus administration of intravenous contrast. CONTRAST:  135mL ISOVUE-300 IOPAMIDOL (ISOVUE-300)  INJECTION 61% COMPARISON:  CT of the abdomen pelvis dated 12/02/2016 FINDINGS: CT CHEST FINDINGS Cardiovascular: There is mild cardiomegaly. No pericardial effusion. Multi vessel coronary vascular calcification. Mild atherosclerotic calcification of the thoracic aorta. No aneurysmal dilatation or dissection. The visualized origins of the great vessels of the aortic arch are patent. The central pulmonary arteries are grossly unremarkable. Mediastinum/Nodes: No hilar or mediastinal adenopathy. Esophagus and thyroid gland are grossly unremarkable. No mediastinal fluid collection. Lungs/Pleura: Bibasilar linear atelectasis/scarring. The lungs are clear. There is no pleural effusion or pneumothorax. The central airways are patent. Musculoskeletal: No chest wall mass or suspicious bone lesions identified. CT ABDOMEN PELVIS FINDINGS No intra-abdominal free air or free fluid. Hepatobiliary: The liver is unremarkable. Cholecystectomy. Mild intrahepatic biliary ductal dilatation. Pancreas: Unremarkable. No pancreatic ductal dilatation or surrounding inflammatory changes. Spleen: Normal in size without focal abnormality. Adrenals/Urinary Tract: The adrenal glands are unremarkable. Small nonobstructing right renal inferior pole hypodense focus which is too small to characterize. There is no hydronephrosis on either side. There is symmetric enhancement and excretion of contrast by both kidneys. The visualized ureters and urinary bladder appear unremarkable. Stomach/Bowel: There is sigmoid diverticulosis without active inflammatory changes. A gastric lap band is noted. There is no bowel obstruction or active inflammation. The appendix is not visualized with certainty. No inflammatory changes identified in the right lower quadrant. Vascular/Lymphatic: Moderate aortoiliac atherosclerotic disease. No portal venous gas. There is no adenopathy. Reproductive: The uterus is anteverted and grossly unremarkable. Other: None  Musculoskeletal: Grade 1 L4-L5 anterolisthesis. No acute osseous pathology. IMPRESSION: No acute intrathoracic, abdominal, or pelvic pathology. Electronically Signed   By: Anner Crete M.D.   On: 05/14/2018 02:42   Ct Cervical Spine Wo Contrast  Result Date: 05/13/2018 CLINICAL DATA:  67 year old female with facial trauma. EXAM: CT HEAD WITHOUT CONTRAST CT MAXILLOFACIAL WITHOUT CONTRAST CT CERVICAL SPINE WITHOUT CONTRAST TECHNIQUE: Multidetector CT imaging of the head, cervical spine, and maxillofacial structures were  performed using the standard protocol without intravenous contrast. Multiplanar CT image reconstructions of the cervical spine and maxillofacial structures were also generated. COMPARISON:  Head CT dated 07/23/2007 FINDINGS: CT HEAD FINDINGS Brain: The ventricles and sulci appropriate size for patient's age. The gray-white matter discrimination is preserved. There is no acute intracranial hemorrhage. No mass effect or midline shift. No extra-axial fluid collection. Vascular: No hyperdense vessel or unexpected calcification. Skull: Normal. Negative for fracture or focal lesion. Other: None. CT MAXILLOFACIAL FINDINGS Osseous: No fracture or mandibular dislocation. No destructive process. Orbits: Negative. No traumatic or inflammatory finding. Sinuses: Clear. Soft tissues: Negative. CT CERVICAL SPINE FINDINGS Alignment: No acute subluxation. Straightening of normal cervical lordosis which may be positional or due to muscle spasm. Skull base and vertebrae: No acute fracture. Soft tissues and spinal canal: No prevertebral fluid or swelling. No visible canal hematoma. Disc levels:  Degenerative changes at C5-C6. Upper chest: Negative. Other: None IMPRESSION: 1. No acute intracranial pathology. 2. No acute facial bone fractures. 3. No acute/traumatic cervical spine pathology. Electronically Signed   By: Anner Crete M.D.   On: 05/13/2018 23:40   Ct Knee Right Wo Contrast  Result Date:  05/14/2018 CLINICAL DATA:  67 year old female with fall and right knee pain. EXAM: CT OF THE right KNEE WITHOUT CONTRAST TECHNIQUE: Multidetector CT imaging of the right knee was performed according to the standard protocol. Multiplanar CT image reconstructions were also generated. COMPARISON:  Earlier radiograph dated 05/13/2018 FINDINGS: Bones/Joint/Cartilage There is a comminuted intra-articular fracture of the proximal tibia with mild depression of the lateral tibial plateau. The bones are osteopenic. There is no dislocation. There is a large suprapatellar lipohemarthrosis. Ligaments Suboptimally assessed by CT. Muscles and Tendons No acute findings. No intramuscular hematoma. Soft tissues There is diffuse subcutaneous stranding of the lateral knee. No fluid collection. IMPRESSION: 1. Comminuted intra-articular fracture of the proximal tibia with mild depression of the lateral tibial plateau. 2. Large suprapatellar lipohemarthrosis. Electronically Signed   By: Anner Crete M.D.   On: 05/14/2018 02:33   Ct Abdomen Pelvis W Contrast  Result Date: 05/14/2018 CLINICAL DATA:  67 year old female with trauma. EXAM: CT CHEST, ABDOMEN, AND PELVIS WITH CONTRAST TECHNIQUE: Multidetector CT imaging of the chest, abdomen and pelvis was performed following the standard protocol during bolus administration of intravenous contrast. CONTRAST:  145mL ISOVUE-300 IOPAMIDOL (ISOVUE-300) INJECTION 61% COMPARISON:  CT of the abdomen pelvis dated 12/02/2016 FINDINGS: CT CHEST FINDINGS Cardiovascular: There is mild cardiomegaly. No pericardial effusion. Multi vessel coronary vascular calcification. Mild atherosclerotic calcification of the thoracic aorta. No aneurysmal dilatation or dissection. The visualized origins of the great vessels of the aortic arch are patent. The central pulmonary arteries are grossly unremarkable. Mediastinum/Nodes: No hilar or mediastinal adenopathy. Esophagus and thyroid gland are grossly  unremarkable. No mediastinal fluid collection. Lungs/Pleura: Bibasilar linear atelectasis/scarring. The lungs are clear. There is no pleural effusion or pneumothorax. The central airways are patent. Musculoskeletal: No chest wall mass or suspicious bone lesions identified. CT ABDOMEN PELVIS FINDINGS No intra-abdominal free air or free fluid. Hepatobiliary: The liver is unremarkable. Cholecystectomy. Mild intrahepatic biliary ductal dilatation. Pancreas: Unremarkable. No pancreatic ductal dilatation or surrounding inflammatory changes. Spleen: Normal in size without focal abnormality. Adrenals/Urinary Tract: The adrenal glands are unremarkable. Small nonobstructing right renal inferior pole hypodense focus which is too small to characterize. There is no hydronephrosis on either side. There is symmetric enhancement and excretion of contrast by both kidneys. The visualized ureters and urinary bladder appear unremarkable. Stomach/Bowel: There is sigmoid  diverticulosis without active inflammatory changes. A gastric lap band is noted. There is no bowel obstruction or active inflammation. The appendix is not visualized with certainty. No inflammatory changes identified in the right lower quadrant. Vascular/Lymphatic: Moderate aortoiliac atherosclerotic disease. No portal venous gas. There is no adenopathy. Reproductive: The uterus is anteverted and grossly unremarkable. Other: None Musculoskeletal: Grade 1 L4-L5 anterolisthesis. No acute osseous pathology. IMPRESSION: No acute intrathoracic, abdominal, or pelvic pathology. Electronically Signed   By: Anner Crete M.D.   On: 05/14/2018 02:42   Ct Ankle Right Wo Contrast  Result Date: 05/14/2018 CLINICAL DATA:  67 year old female with fall and right ankle pain. EXAM: CT OF THE RIGHT ANKLE WITHOUT CONTRAST TECHNIQUE: Multidetector CT imaging of the right ankle was performed according to the standard protocol. Multiplanar CT image reconstructions were also generated.  COMPARISON:  Right ankle radiograph dated 05/13/2018 FINDINGS: Bones/Joint/Cartilage There is a comminuted fracture of the distal fibula with extension of the fracture into the distal tibia-fibular syndesmosis. There is a minimally displaced fracture of the lateral aspect of the tibial plafond and nondisplaced fracture of the posterior malleolus. There is a minimally displaced fracture of the medial malleolus. There bones are osteopenic. Discontinuity of the plantar cortex of the medial cuneiform (series 5, image 58 and sagittal series 6, image 89) concerning for an avulsion fracture. There is also linear lucency through the plantar base of the first metatarsal (series 6, image 90) also concerning for an avulsion corner fracture. Probable nondisplaced fracture of the dorsal aspect of the medial cuneiform (series 4, image 146 and sagittal series 6, image 69). Fracture of the base of the second metatarsal (series 6, image 73 and series 4, image 161). Nondisplaced linear lucency through the proximal fourth metatarsal (series 6, image 58) may be artifactual or represent a vascular groove or a nondisplaced fracture. No dislocation. Diffuse soft tissue edema. Ligaments Suboptimally assessed by CT. Muscles and Tendons No acute intramuscular findings. Soft tissues Diffuse subcutaneous edema. IMPRESSION: 1. Comminuted fracture of the distal fibula with extension of the fracture into the distal tibia-fibular syndesmosis. 2. Minimally displaced fracture of the lateral aspect of the tibial plafond and nondisplaced fracture of the posterior malleolus. 3. Minimally displaced fracture of the medial malleolus. 4. Nondisplaced fracture of the base of the second metatarsal. 5. Linear lucency through the plantar cortex of the medial cuneiform concerning for an avulsion fracture. 6. Linear lucency through the proximal fourth metatarsal may be artifactual or represent a nondisplaced fracture. Electronically Signed   By: Anner Crete  M.D.   On: 05/14/2018 02:55   Dg Shoulder Left  Result Date: 05/14/2018 CLINICAL DATA:  General left shoulder pain.  Pain after fall. EXAM: LEFT SHOULDER - 2+ VIEW COMPARISON:  None. FINDINGS: There is no evidence of fracture or dislocation. There is no evidence of arthropathy or other focal bone abnormality. Soft tissues are unremarkable. IMPRESSION: Negative. Electronically Signed   By: Dorise Bullion III M.D   On: 05/14/2018 00:07   Dg Knee Right Port  Result Date: 05/15/2018 CLINICAL DATA:  Postoperative right knee EXAM: PORTABLE RIGHT KNEE - 1-2 VIEW COMPARISON:  Intraoperative fluoroscopy 05/15/2018 FINDINGS: Postoperative changes with lateral plate and screw fixation of the proximal tibial metaphysis and lateral tibial plateau. Hardware components appear intact and well seated. Degenerative changes in the right knee. Soft tissue gas consistent with recent surgery. IMPRESSION: Postoperative internal fixation of lateral tibial plateau fracture. Electronically Signed   By: Lucienne Capers M.D.   On: 05/15/2018 22:06  Dg Ankle Right Port  Result Date: 05/15/2018 CLINICAL DATA:  Postoperative right ankle EXAM: PORTABLE RIGHT ANKLE - 2 VIEW COMPARISON:  Intraoperative fluoroscopy 05/15/2018 FINDINGS: Postoperative changes with plate and screw fixation of a fracture of the distal fibular shaft and screw fixation of the medial malleolar fracture fragment. Hardware components appear intact and well seated. Near-anatomic alignment of the fracture fragments. Overlying splint material obscures bone detail. IMPRESSION: Postoperative internal fixation of fractures of the right distal fibula and medial malleolus. Electronically Signed   By: Lucienne Capers M.D.   On: 05/15/2018 22:08   Dg Hand Complete Right  Result Date: 05/14/2018 CLINICAL DATA:  67 year old female with fall and trauma to the right hand. EXAM: RIGHT HAND - COMPLETE 3+ VIEW COMPARISON:  Right wrist radiograph dated 05/13/2018 FINDINGS:  Comminuted mildly displaced intra-articular fracture of the distal radius as seen on the earlier wrist radiograph. No other acute fracture noted. The bones are osteopenic. No dislocation. Soft tissue swelling of the wrist. IMPRESSION: Comminuted mildly displaced intra-articular fracture of the distal radius. Electronically Signed   By: Anner Crete M.D.   On: 05/14/2018 01:09   Dg C-arm 1-60 Min  Result Date: 05/15/2018 CLINICAL DATA:  67 year old female with right distal radial fracture. Subsequent encounter. EXAM: DG C-ARM 61-120 MIN; RIGHT WRIST - COMPLETE 3+ VIEW Fluoroscopic time: 49.7 seconds. COMPARISON:  05/14/2018. FINDINGS: Five intraoperative C-arm views submitted for review after surgery. Distal right radial fracture reduced utilizing plate and screws. Much better alignment of fracture fragments with minimal separation/incongruity of articular surface. IMPRESSION: Open reduction and internal fixation distal right radius fracture. Electronically Signed   By: Genia Del M.D.   On: 05/15/2018 20:07   Dg C-arm 1-60 Min  Result Date: 05/15/2018 CLINICAL DATA:  67 year old female with tibial plateau fracture and right ankle fracture. Subsequent encounter. EXAM: RIGHT TIBIA AND FIBULA - 2 VIEW; DG C-ARM 61-120 MIN Fluoroscopic time: 3 minutes and 8 seconds. COMPARISON:  05/14/2018 CT. FINDINGS: Eleven intraoperative film submitted for review after surgery. Six intraoperative C-arm views of the right knee: Reduction of lateral tibial plateau fracture with sideplate and screws. Articular surface with better alignment although with slight depression. Five intraoperative C-arm views of the right ankle: Reduction of fibular fracture with sideplate and screws. Screws transfix the distal fibula-tibia joint space. Reduction of medial malleolar fracture with screw. Evaluation of posterior tibial fracture limited secondary to overlying plate. IMPRESSION: Open reduction and internal fixation right knee and  right ankle fractures as noted above. Electronically Signed   By: Genia Del M.D.   On: 05/15/2018 17:57   Dg C-arm 1-60 Min  Result Date: 05/15/2018 CLINICAL DATA:  67 year old female with tibial plateau fracture and right ankle fracture. Subsequent encounter. EXAM: RIGHT TIBIA AND FIBULA - 2 VIEW; DG C-ARM 61-120 MIN Fluoroscopic time: 3 minutes and 8 seconds. COMPARISON:  05/14/2018 CT. FINDINGS: Eleven intraoperative film submitted for review after surgery. Six intraoperative C-arm views of the right knee: Reduction of lateral tibial plateau fracture with sideplate and screws. Articular surface with better alignment although with slight depression. Five intraoperative C-arm views of the right ankle: Reduction of fibular fracture with sideplate and screws. Screws transfix the distal fibula-tibia joint space. Reduction of medial malleolar fracture with screw. Evaluation of posterior tibial fracture limited secondary to overlying plate. IMPRESSION: Open reduction and internal fixation right knee and right ankle fractures as noted above. Electronically Signed   By: Genia Del M.D.   On: 05/15/2018 17:57   Ct Maxillofacial  Wo Contrast  Result Date: 05/13/2018 CLINICAL DATA:  67 year old female with facial trauma. EXAM: CT HEAD WITHOUT CONTRAST CT MAXILLOFACIAL WITHOUT CONTRAST CT CERVICAL SPINE WITHOUT CONTRAST TECHNIQUE: Multidetector CT imaging of the head, cervical spine, and maxillofacial structures were performed using the standard protocol without intravenous contrast. Multiplanar CT image reconstructions of the cervical spine and maxillofacial structures were also generated. COMPARISON:  Head CT dated 07/23/2007 FINDINGS: CT HEAD FINDINGS Brain: The ventricles and sulci appropriate size for patient's age. The gray-white matter discrimination is preserved. There is no acute intracranial hemorrhage. No mass effect or midline shift. No extra-axial fluid collection. Vascular: No hyperdense vessel or  unexpected calcification. Skull: Normal. Negative for fracture or focal lesion. Other: None. CT MAXILLOFACIAL FINDINGS Osseous: No fracture or mandibular dislocation. No destructive process. Orbits: Negative. No traumatic or inflammatory finding. Sinuses: Clear. Soft tissues: Negative. CT CERVICAL SPINE FINDINGS Alignment: No acute subluxation. Straightening of normal cervical lordosis which may be positional or due to muscle spasm. Skull base and vertebrae: No acute fracture. Soft tissues and spinal canal: No prevertebral fluid or swelling. No visible canal hematoma. Disc levels:  Degenerative changes at C5-C6. Upper chest: Negative. Other: None IMPRESSION: 1. No acute intracranial pathology. 2. No acute facial bone fractures. 3. No acute/traumatic cervical spine pathology. Electronically Signed   By: Anner Crete M.D.   On: 05/13/2018 23:40     CBC Recent Labs  Lab 05/17/18 0304 05/18/18 0231 05/19/18 0131 05/20/18 0307 05/21/18 0244  WBC 8.8 8.7 8.6 8.2 8.8  HGB 7.8* 7.9* 7.7* 7.9* 8.3*  HCT 24.8* 25.2* 24.4* 25.2* 26.8*  PLT 194 215 258 308 368  MCV 93.2 93.7 93.1 93.3 93.4  MCH 29.3 29.4 29.4 29.3 28.9  MCHC 31.5 31.3 31.6 31.3 31.0  RDW 14.4 14.5 14.3 14.4 14.3  LYMPHSABS 1.3 1.8 1.6 1.5 1.7  MONOABS 0.7 0.7 0.6 0.5 0.6  EOSABS 0.0 0.1 0.1 0.1 0.1  BASOSABS 0.0 0.0 0.0 0.0 0.0    Chemistries  Recent Labs  Lab 05/15/18 0443  05/17/18 0304 05/18/18 0231 05/19/18 0131 05/20/18 0307 05/21/18 0244  NA 134*   < > 134* 135 133* 134* 135  K 4.5   < > 3.7 4.1 3.9 3.7 4.3  CL 103   < > 95* 98 96* 98 103  CO2 24   < > 31 28 29 28 25   GLUCOSE 115*   < > 121* 115* 113* 104* 95  BUN 12   < > 8 9 15 14 14   CREATININE 0.54   < > 0.56 0.55 0.57 0.59 0.57  CALCIUM 8.8*   < > 8.2* 8.3* 8.3* 8.1* 8.2*  MG 2.1  --   --   --   --   --   --   AST  --   --   --   --   --  17 20  ALT  --   --   --   --   --  38 31  ALKPHOS  --   --   --   --   --  59 55  BILITOT  --   --   --   --   --   0.8 1.6*   < > = values in this interval not displayed.   ------------------------------------------------------------------------------------------------------------------ No results for input(s): CHOL, HDL, LDLCALC, TRIG, CHOLHDL, LDLDIRECT in the last 72 hours.  Lab Results  Component Value Date   HGBA1C 5.5 03/11/2016   ------------------------------------------------------------------------------------------------------------------ No results for  input(s): TSH, T4TOTAL, T3FREE, THYROIDAB in the last 72 hours.  Invalid input(s): FREET3 ------------------------------------------------------------------------------------------------------------------ No results for input(s): VITAMINB12, FOLATE, FERRITIN, TIBC, IRON, RETICCTPCT in the last 72 hours.  Coagulation profile No results for input(s): INR, PROTIME in the last 168 hours.  No results for input(s): DDIMER in the last 72 hours.  Cardiac Enzymes No results for input(s): CKMB, TROPONINI, MYOGLOBIN in the last 168 hours.  Invalid input(s): CK ------------------------------------------------------------------------------------------------------------------    Component Value Date/Time   BNP 106.2 (H) 12/03/2016 0258    Verneita Griffes, MD [facility] 9:03 AM

## 2018-05-22 ENCOUNTER — Inpatient Hospital Stay
Admission: RE | Admit: 2018-05-22 | Discharge: 2018-05-30 | Disposition: A | Discharge status: Home / Self Care | Payer: Medicare Other | Source: Ambulatory Visit | Attending: Internal Medicine | Admitting: Internal Medicine

## 2018-05-22 ENCOUNTER — Non-Acute Institutional Stay (SKILLED_NURSING_FACILITY): Payer: Medicare Other | Admitting: Internal Medicine

## 2018-05-22 ENCOUNTER — Other Ambulatory Visit: Payer: Self-pay

## 2018-05-22 ENCOUNTER — Encounter: Payer: Self-pay | Admitting: Internal Medicine

## 2018-05-22 DIAGNOSIS — S82141A Displaced bicondylar fracture of right tibia, initial encounter for closed fracture: Secondary | ICD-10-CM

## 2018-05-22 DIAGNOSIS — M069 Rheumatoid arthritis, unspecified: Secondary | ICD-10-CM | POA: Diagnosis not present

## 2018-05-22 DIAGNOSIS — G90511 Complex regional pain syndrome I of right upper limb: Secondary | ICD-10-CM | POA: Diagnosis not present

## 2018-05-22 DIAGNOSIS — I5032 Chronic diastolic (congestive) heart failure: Secondary | ICD-10-CM

## 2018-05-22 DIAGNOSIS — I2510 Atherosclerotic heart disease of native coronary artery without angina pectoris: Secondary | ICD-10-CM | POA: Diagnosis not present

## 2018-05-22 DIAGNOSIS — E785 Hyperlipidemia, unspecified: Secondary | ICD-10-CM | POA: Diagnosis not present

## 2018-05-22 DIAGNOSIS — T07XXXA Unspecified multiple injuries, initial encounter: Secondary | ICD-10-CM

## 2018-05-22 DIAGNOSIS — M059 Rheumatoid arthritis with rheumatoid factor, unspecified: Secondary | ICD-10-CM | POA: Diagnosis not present

## 2018-05-22 DIAGNOSIS — M329 Systemic lupus erythematosus, unspecified: Secondary | ICD-10-CM | POA: Diagnosis not present

## 2018-05-22 DIAGNOSIS — G459 Transient cerebral ischemic attack, unspecified: Secondary | ICD-10-CM | POA: Diagnosis not present

## 2018-05-22 DIAGNOSIS — S92321D Displaced fracture of second metatarsal bone, right foot, subsequent encounter for fracture with routine healing: Secondary | ICD-10-CM | POA: Diagnosis not present

## 2018-05-22 DIAGNOSIS — I959 Hypotension, unspecified: Secondary | ICD-10-CM | POA: Diagnosis not present

## 2018-05-22 DIAGNOSIS — S82851D Displaced trimalleolar fracture of right lower leg, subsequent encounter for closed fracture with routine healing: Secondary | ICD-10-CM | POA: Diagnosis not present

## 2018-05-22 DIAGNOSIS — S8261XD Displaced fracture of lateral malleolus of right fibula, subsequent encounter for closed fracture with routine healing: Secondary | ICD-10-CM | POA: Diagnosis not present

## 2018-05-22 DIAGNOSIS — M2550 Pain in unspecified joint: Secondary | ICD-10-CM | POA: Diagnosis not present

## 2018-05-22 DIAGNOSIS — E782 Mixed hyperlipidemia: Secondary | ICD-10-CM | POA: Diagnosis not present

## 2018-05-22 DIAGNOSIS — S82121D Displaced fracture of lateral condyle of right tibia, subsequent encounter for closed fracture with routine healing: Secondary | ICD-10-CM | POA: Diagnosis not present

## 2018-05-22 DIAGNOSIS — E663 Overweight: Secondary | ICD-10-CM | POA: Diagnosis not present

## 2018-05-22 DIAGNOSIS — I10 Essential (primary) hypertension: Secondary | ICD-10-CM | POA: Diagnosis not present

## 2018-05-22 DIAGNOSIS — Z969 Presence of functional implant, unspecified: Secondary | ICD-10-CM | POA: Diagnosis not present

## 2018-05-22 DIAGNOSIS — Z9181 History of falling: Secondary | ICD-10-CM | POA: Diagnosis not present

## 2018-05-22 DIAGNOSIS — Z7401 Bed confinement status: Secondary | ICD-10-CM | POA: Diagnosis not present

## 2018-05-22 DIAGNOSIS — M79675 Pain in left toe(s): Secondary | ICD-10-CM | POA: Diagnosis not present

## 2018-05-22 DIAGNOSIS — S52571D Other intraarticular fracture of lower end of right radius, subsequent encounter for closed fracture with routine healing: Secondary | ICD-10-CM | POA: Diagnosis not present

## 2018-05-22 DIAGNOSIS — M6281 Muscle weakness (generalized): Secondary | ICD-10-CM | POA: Diagnosis not present

## 2018-05-22 DIAGNOSIS — Z4789 Encounter for other orthopedic aftercare: Secondary | ICD-10-CM | POA: Diagnosis not present

## 2018-05-22 DIAGNOSIS — R279 Unspecified lack of coordination: Secondary | ICD-10-CM | POA: Diagnosis not present

## 2018-05-22 DIAGNOSIS — S52501A Unspecified fracture of the lower end of right radius, initial encounter for closed fracture: Secondary | ICD-10-CM | POA: Diagnosis not present

## 2018-05-22 DIAGNOSIS — I739 Peripheral vascular disease, unspecified: Secondary | ICD-10-CM | POA: Diagnosis not present

## 2018-05-22 DIAGNOSIS — S82841D Displaced bimalleolar fracture of right lower leg, subsequent encounter for closed fracture with routine healing: Secondary | ICD-10-CM | POA: Diagnosis not present

## 2018-05-22 LAB — CBC WITH DIFFERENTIAL/PLATELET
Abs Immature Granulocytes: 0.08 10*3/uL — ABNORMAL HIGH (ref 0.00–0.07)
BASOS PCT: 1 %
Basophils Absolute: 0.1 10*3/uL (ref 0.0–0.1)
EOS PCT: 5 %
Eosinophils Absolute: 0.4 10*3/uL (ref 0.0–0.5)
HCT: 27.4 % — ABNORMAL LOW (ref 36.0–46.0)
Hemoglobin: 8.5 g/dL — ABNORMAL LOW (ref 12.0–15.0)
Immature Granulocytes: 1 %
LYMPHS PCT: 21 %
Lymphs Abs: 1.7 10*3/uL (ref 0.7–4.0)
MCH: 29.3 pg (ref 26.0–34.0)
MCHC: 31 g/dL (ref 30.0–36.0)
MCV: 94.5 fL (ref 80.0–100.0)
Monocytes Absolute: 0.7 10*3/uL (ref 0.1–1.0)
Monocytes Relative: 8 %
NEUTROS ABS: 5 10*3/uL (ref 1.7–7.7)
NEUTROS PCT: 64 %
NRBC: 0 % (ref 0.0–0.2)
PLATELETS: 350 10*3/uL (ref 150–400)
RBC: 2.9 MIL/uL — AB (ref 3.87–5.11)
RDW: 14.6 % (ref 11.5–15.5)
WBC: 7.9 10*3/uL (ref 4.0–10.5)

## 2018-05-22 LAB — COMPREHENSIVE METABOLIC PANEL
ALBUMIN: 2.4 g/dL — AB (ref 3.5–5.0)
ALK PHOS: 69 U/L (ref 38–126)
ALT: 45 U/L — ABNORMAL HIGH (ref 0–44)
ANION GAP: 5 (ref 5–15)
AST: 23 U/L (ref 15–41)
BUN: 9 mg/dL (ref 8–23)
CO2: 28 mmol/L (ref 22–32)
Calcium: 8.3 mg/dL — ABNORMAL LOW (ref 8.9–10.3)
Chloride: 102 mmol/L (ref 98–111)
Creatinine, Ser: 0.67 mg/dL (ref 0.44–1.00)
GFR calc non Af Amer: >60 mL/min (ref >60)
GLUCOSE: 86 mg/dL (ref 70–99)
POTASSIUM: 3.7 mmol/L (ref 3.5–5.1)
SODIUM: 135 mmol/L (ref 135–145)
Total Bilirubin: 0.8 mg/dL (ref 0.3–1.2)
Total Protein: 5.1 g/dL — ABNORMAL LOW (ref 6.5–8.1)

## 2018-05-22 MED ORDER — ONDANSETRON HCL 4 MG PO TABS: 4.0000 mg | ORAL_TABLET | Freq: Three times a day (TID) | ORAL | Status: DC | PRN

## 2018-05-22 MED ORDER — HYDROMORPHONE HCL 2 MG PO TABS: 2.0000 mg | ORAL_TABLET | ORAL | 0 refills | Status: DC | PRN

## 2018-05-22 MED ORDER — ACETAMINOPHEN 325 MG PO TABS: 650.0000 mg | ORAL_TABLET | Freq: Four times a day (QID) | ORAL | Status: DC

## 2018-05-22 MED ORDER — PREDNISONE 20 MG PO TABS: 20.0000 mg | ORAL_TABLET | Freq: Every day | ORAL | 0 refills | Status: DC

## 2018-05-22 MED ORDER — IBUPROFEN 800 MG PO TABS: 800.0000 mg | ORAL_TABLET | Freq: Three times a day (TID) | ORAL | 0 refills | Status: DC

## 2018-05-22 MED ORDER — GABAPENTIN 300 MG PO CAPS: 300.0000 mg | ORAL_CAPSULE | Freq: Three times a day (TID) | ORAL | 0 refills | Status: DC

## 2018-05-22 MED ORDER — FERROUS SULFATE 325 (65 FE) MG PO TABS: 325.0000 mg | ORAL_TABLET | Freq: Three times a day (TID) | ORAL | 3 refills | Status: DC

## 2018-05-22 NOTE — Clinical Social Work Placement (Signed)
   CLINICAL SOCIAL WORK PLACEMENT  NOTE  Date:  05/22/2018  Patient Details  Name: Cindy Holmes MRN: [id number] Date of Birth: 1951/04/04  Clinical Social Work is seeking post-discharge placement for this patient at the [place] level of care (*CSW will initial, date and re-position this form in  chart as items are completed):  Yes   Patient/family provided with [facility] Work Department's list of facilities offering this level of care within the geographic area requested by the patient (or if unable, by the patient's family).  Yes   Patient/family informed of their freedom to choose among providers that offer the needed level of care, that participate in Medicare, Medicaid or managed care program needed by the patient, have an available bed and are willing to accept the patient.  Yes   Patient/family informed of [facility]'s ownership interest in [facility] and [facility], as well as of the fact that they are under no obligation to receive care at these facilities.  PASRR submitted to EDS on       PASRR number received on 05/17/18     Existing PASRR number confirmed on       FL2 transmitted to all facilities in geographic area requested by pt/family on 05/17/18     FL2 transmitted to all facilities within larger geographic area on       Patient informed that his/her managed care company has contracts with or will negotiate with certain facilities, including the following:        Yes   Patient/family informed of bed offers received.  Patient chooses bed at [facility]     Physician recommends and patient chooses bed at      Patient to be transferred to [facility] on 05/22/18.  Patient to be transferred to facility by [facility]     Patient family notified on 05/22/18 of transfer.  Name of family member notified:  Herbie [place] (spouse)     PHYSICIAN       Additional Comment:     _______________________________________________ Alberteen Sam, LCSW 05/22/2018, 10:12 AM

## 2018-05-22 NOTE — Discharge Summary (Signed)
Physician Discharge Summary  Cindy Holmes OBS:[id number] DOB: 10-18-1950 DOA: 05/13/2018  PCP: Redmond School, MD  Admit date: 05/13/2018 Discharge date: 05/22/2018  Time spent: 35 minutes  Recommendations for Outpatient Follow-up:  1. Initiated Dilaudid, Tylenol scheduled, ibuprofen scheduled please give with food as well as gabapentin for pain prescription given for controlled substances will need outpatient follow-up with orthopedics to further delineate 2. Needs zofran for nsaid/opiate assosc n/v 3. Recommend outpatient basic metabolic panel and CBC in 1 week 4. Note done methotrexate and other DM ARDS as below have been held-would only give prednisone at slightly higher dose and this can be titrated downwards in the outpatient setting once seen by trauma Dr. Doreatha Martin and reinitiate meds as per their instruction  Discharge Diagnoses:  Principal Problem:   Multiple fractures Active Problems:   Essential hypertension   Rheumatoid arthritis ([place])   Coronary artery disease involving native coronary artery of native heart without angina pectoris   Chronic diastolic CHF (congestive heart failure) (HCC)   Fracture of distal end of right radius   Tibial plateau fracture, right, closed, initial encounter   Fall at home, initial encounter   Discharge Condition: imProved  Diet recommendation: Healthy heart  [facility] Weights   05/13/18 2152 05/14/18 0501  Weight: 93.9 kg 100.3 kg    History of present illness:  67 y.o.femalewith medical history significant ofRA, CAD s/p stents, chronic diastolic CHF, HTN, TIA admitted on 05/14/2018 after sustaining a fall (transferred from [facility] to [place]), imaging studies revealed R distal radius fx, R tibial plateu fractures, R ankle Trimalleolar fractures, d/c to SNF if pain is controlled on oral agents only  Hospital Course:  Plan:- 1)S/p Fall with multiple injuries injuries:  A). Right lateral tibial plateau fracture  s/p ORIF on 05/15/18 B). Right trimalleolar ankle fracture s/p ORIF on 05/15/18 C) Right distal radius fracture s/p ORIF on 05/15/18  Weightbearing: NWB RLE, WBAT RUE thru elbow Insicional and dressing care: Keep splints clean dry and intact Orthopedic device(s):Right wrist brace  pain control remains challenging, PCA pump discontinued on 05/18/2018, changed meds to reflect Orthopedics thoughts--still requiring IV dilaudid and so cannot d/c today to skilled faciltiy--Some improvement with PO scheudled tylenol and toraqdol As such, chgn toradol PO ibuprofen high dose 800 tid-Please ensure that ibuprofen is given with food Her pain is now stable in the 6-7 range Prescription for controlled substances given today would continue gabapentin in addition to NSAIDs Tylenol and can back down on NSAIDs in the next couple of days 2 weeks with while working with therapy  2) acute on chronic anemia-- normocytic normochromic anemia of chronic disease in the setting of chronic rheumatoid arthritis,  now acute blood loss anemia secondary to fractures and operative repair,  Hemoglobin stabilised in 7-8 range Hemoccult neg, currently tolerating anemia well-no current Tr indication Stable for past several days Review as an outpatient placed on iron on discharge  3)HFpEF--- patient with history of chronic CHF, no acute exacerbation at this time, continue cardiac meds  as below #5-continue Lasix 40 daily  4)RA----orthopedic team is requesting that we will continue to hold DMARD and methotrexate through 06/01/2018, however okay to continue prednisone 80 mg daily  5)h/o CAD--- status post prior coronary stent, stable, no ACS symptoms, continue aspirin, Plavix metoprolol and ramipril  6)Presumed osteoporosis----patient with chronic steroid use now status post fall with multiple fractures, pharmacy consult regarding biphosphonate counseling, plan is to start  Fosamax as outpatient  7) cold sore to  lip-given a one-time dose of Valtrex 2000 mg and expectant recovery expected  8) continue allopurinol for presumed gout 400 mg daily  Procedures:  ORIF left tibial plateau fracture  ORIF right trimalleolar ankle fracture  ORIF right syndesmosis  ORIF right intra-articular distal radial fracture performed on 11/4  Consultations:  Dr. Doreatha Martin of orthopedic surgery  Discharge Exam: Vitals:   05/21/18 2047 05/22/18 0640  BP: (!) 130/47 (!) 137/57  Pulse: 88 73  Resp: 16   Temp: 99.2 F (37.3 C) 98.7 F (37.1 C)  SpO2: 98% 97%    General: Awake alert pleasant no significant distress some pain but is more mobile and able to do more Cardiovascular: S1-S2 no murmur rub or gallop Respiratory: Clinically clear no added sound Does have braces on both right lower extremity right upper extremity  Discharge Instructions   Discharge Instructions    Diet - low sodium heart healthy   Complete by:  As directed    Increase activity slowly   Complete by:  As directed      Allergies as of 05/22/2018      Reactions   Codeine Phosphate Shortness Of Breath   REACTION: unspecified   Amoxicillin Nausea And Vomiting   REACTION: unspecified   Penicillins Nausea And Vomiting   Has patient had a PCN reaction causing immediate rash, facial/tongue/throat swelling, SOB or lightheadedness with hypotension:YES Has patient had a PCN reaction causing severe rash involving mucus membranes or skin necrosis: NO Has patient had a PCN reaction that required hospitalization NO Has patient had a PCN reaction occurring within the last 10 years: NO If all of the above answers are "NO", then may proceed with Cephalosporin use.   Morphine And Related Nausea And Vomiting   Lidocaine Rash   Patch caused rash      Medication List    STOP taking these medications   alum & mag hydroxide-simeth 200-200-20 MG/5ML suspension Commonly known as:  MAALOX/MYLANTA    hydroxychloroquine 200 MG tablet Commonly known as:  PLAQUENIL   leflunomide 20 MG tablet Commonly known as:  ARAVA   methotrexate 2.5 MG tablet Commonly known as:  RHEUMATREX     TAKE these medications   acetaminophen 325 MG tablet Commonly known as:  TYLENOL Take 2 tablets (650 mg total) by mouth every 6 (six) hours.   allopurinol 100 MG tablet Commonly known as:  ZYLOPRIM Take 400 mg by mouth daily.   aspirin 81 MG tablet Take 81 mg by mouth daily.   atorvastatin 40 MG tablet Commonly known as:  LIPITOR Take 2 tablets (80 mg total) by mouth daily at 6 PM.   clopidogrel 75 MG tablet Commonly known as:  PLAVIX Take 1 tablet (75 mg total) by mouth daily.   esomeprazole 40 MG capsule Commonly known as:  NEXIUM Take 1 capsule (40 mg total) by mouth 2 (two) times daily before a meal. For acid reflux   ezetimibe 10 MG tablet Commonly known as:  ZETIA Take 1 tablet (10 mg total) by mouth daily.   ferrous sulfate 325 (65 FE) MG tablet Take 1 tablet (325 mg total) by mouth 3 (three) times daily with meals.   Fish Oil 1000 MG Caps Take 1 capsule by mouth daily.   folic acid 1 MG tablet Commonly known as:  FOLVITE Take 1 mg by mouth daily. Takes with chemo medication.   furosemide 40 MG tablet Commonly known as:  LASIX TAKE 1 TABLET BY MOUTH  DAILY AND MAY TAKE AN  ADDITIONAL 1 TABLET DAILY  AS NEEDED FOR SWELLING.   gabapentin 300 MG capsule Commonly known as:  NEURONTIN Take 1 capsule (300 mg total) by mouth 3 (three) times daily.   HYDROmorphone 2 MG tablet Commonly known as:  DILAUDID Take 1 tablet (2 mg total) by mouth every 3 (three) hours as needed for severe pain.   ibuprofen 800 MG tablet Commonly known as:  ADVIL,MOTRIN Take 1 tablet (800 mg total) by mouth 3 (three) times daily.   levocetirizine 5 MG tablet Commonly known as:  XYZAL Take 1 tablet by mouth daily.   metoprolol tartrate 50 MG tablet Commonly known as:  LOPRESSOR TAKE 1 TABLET BY  MOUTH ONCE DAILY   nitroGLYCERIN 0.4 MG SL tablet Commonly known as:  NITROSTAT DISSOLVE 1 TABLET UNDER THE TONGUE EVERY 5 MINUTES AS  NEEDED (NOT TO EXCEED 3  TABLETS IN 15 MINS, CALL  911 IF CHEST PAIN PERSISTS   predniSONE 20 MG tablet Commonly known as:  DELTASONE Take 1 tablet (20 mg total) by mouth daily with breakfast. What changed:    medication strength  how much to take   ramipril 2.5 MG capsule Commonly known as:  ALTACE Take 1 capsule (2.5 mg total) by mouth daily.   senna-docusate 8.6-50 MG tablet Commonly known as:  Senokot-S Take 1 tablet by mouth daily as needed for mild constipation.      Allergies  Allergen Reactions  . Codeine Phosphate Shortness Of Breath    REACTION: unspecified  . Amoxicillin Nausea And Vomiting    REACTION: unspecified  . Penicillins Nausea And Vomiting    Has patient had a PCN reaction causing immediate rash, facial/tongue/throat swelling, SOB or lightheadedness with hypotension:YES Has patient had a PCN reaction causing severe rash involving mucus membranes or skin necrosis: NO Has patient had a PCN reaction that required hospitalization NO Has patient had a PCN reaction occurring within the last 10 years: NO If all of the above answers are "NO", then may proceed with Cephalosporin use.  Marland Kitchen Morphine And Related Nausea And Vomiting  . Lidocaine Rash    Patch caused rash   Follow-up Information    Haddix, Thomasene Lot, MD. Schedule an appointment as soon as possible for a visit in 2 week(s).   Specialty:  Orthopedic Surgery Contact information: [address] [place] [address] [phone]            The results of significant diagnostics from this hospitalization (including imaging, microbiology, ancillary and laboratory) are listed below for reference.    Significant Diagnostic Studies: Dg Chest 2 View  Result Date: 05/14/2018 CLINICAL DATA:  67 year old female with fall. History of CHF. EXAM: CHEST - 2 VIEW  COMPARISON:  Chest CT dated 05/14/2018 FINDINGS: There is cardiomegaly with probable mild vascular congestion. No focal consolidation, pleural effusion, or pneumothorax. No acute osseous pathology. IMPRESSION: Cardiomegaly with probable mild vascular congestion. No focal consolidation. Electronically Signed   By: Anner Crete M.D.   On: 05/14/2018 01:10   Dg Pelvis 1-2 Views  Result Date: 05/14/2018 CLINICAL DATA:  Fall EXAM: PELVIS - 1-2 VIEW COMPARISON:  None. FINDINGS: There is no evidence of pelvic fracture or diastasis. No pelvic bone lesions are seen. IMPRESSION: Negative. Electronically Signed   By: Kerby Moors M.D.   On: 05/14/2018 01:10   Dg Forearm Right  Result Date: 05/14/2018 CLINICAL DATA:  Fall.  Right wrist pain. EXAM: RIGHT FOREARM - 2 VIEW COMPARISON:  None. FINDINGS: There is a an acute  intra-articular fracture deformity involving the distal radius. Dorsal angulation of the distal fracture fragments noted. No dislocation identified. IMPRESSION: 1. Acute fracture involves the dorsal aspect of the distal radius. Mild dorsal angulation of the distal fracture fragments. Electronically Signed   By: Kerby Moors M.D.   On: 05/14/2018 01:07   Dg Wrist Complete Left  Result Date: 05/14/2018 CLINICAL DATA:  67 year old female with fall and left wrist pain. EXAM: LEFT WRIST - COMPLETE 3+ VIEW COMPARISON:  None. FINDINGS: There is no acute fracture or dislocation. The bones are osteopenic. No significant arthritic changes. There is soft tissue swelling over the wrist and ulna. No radiopaque foreign object or soft tissue gas. IMPRESSION: No acute fracture or dislocation. Electronically Signed   By: Anner Crete M.D.   On: 05/14/2018 00:13   Dg Wrist Complete Right  Result Date: 05/15/2018 CLINICAL DATA:  Postoperative right wrist EXAM: RIGHT WRIST - COMPLETE 3+ VIEW COMPARISON:  Intraoperative fluoroscopy 05/15/2018 FINDINGS: Postoperative plate and screw fixation of the distal  right radial metaphysis. Are where components appear intact and well seated. Near-anatomic alignment of the fracture fragments. Degenerative changes in the radiocarpal and STT joints. Overlying splint material obscures some bone detail. IMPRESSION: Postoperative plate and screw fixation of the distal right radial metaphysis without apparent complication. Electronically Signed   By: Lucienne Capers M.D.   On: 05/15/2018 22:09   Dg Wrist Complete Right  Result Date: 05/15/2018 CLINICAL DATA:  67 year old female with right distal radial fracture. Subsequent encounter. EXAM: DG C-ARM 61-120 MIN; RIGHT WRIST - COMPLETE 3+ VIEW Fluoroscopic time: 49.7 seconds. COMPARISON:  05/14/2018. FINDINGS: Five intraoperative C-arm views submitted for review after surgery. Distal right radial fracture reduced utilizing plate and screws. Much better alignment of fracture fragments with minimal separation/incongruity of articular surface. IMPRESSION: Open reduction and internal fixation distal right radius fracture. Electronically Signed   By: Genia Del M.D.   On: 05/15/2018 20:07   Dg Wrist Complete Right  Result Date: 05/14/2018 CLINICAL DATA:  Pain after fall EXAM: RIGHT WRIST - COMPLETE 3+ VIEW COMPARISON:  None. FINDINGS: There is a comminuted mildly displaced fracture through the distal radial metaphysis. IMPRESSION: Comminuted mildly displaced fracture through the distal radial metaphysis. Electronically Signed   By: Dorise Bullion III M.D   On: 05/14/2018 00:12   Dg Knee 2 Views Right  Result Date: 05/14/2018 CLINICAL DATA:  Pain after fall EXAM: RIGHT KNEE - 1-2 VIEW COMPARISON:  None. FINDINGS: A hemarthrosis is identified. There is a depressed lateral tibial plateau fracture which is comminuted. IMPRESSION: Comminuted mildly depressed lateral tibial plateau fracture with resulting hemarthrosis. Electronically Signed   By: Dorise Bullion III M.D   On: 05/14/2018 00:08   Dg Tibia/fibula Right  Result  Date: 05/15/2018 CLINICAL DATA:  67 year old female with tibial plateau fracture and right ankle fracture. Subsequent encounter. EXAM: RIGHT TIBIA AND FIBULA - 2 VIEW; DG C-ARM 61-120 MIN Fluoroscopic time: 3 minutes and 8 seconds. COMPARISON:  05/14/2018 CT. FINDINGS: Eleven intraoperative film submitted for review after surgery. Six intraoperative C-arm views of the right knee: Reduction of lateral tibial plateau fracture with sideplate and screws. Articular surface with better alignment although with slight depression. Five intraoperative C-arm views of the right ankle: Reduction of fibular fracture with sideplate and screws. Screws transfix the distal fibula-tibia joint space. Reduction of medial malleolar fracture with screw. Evaluation of posterior tibial fracture limited secondary to overlying plate. IMPRESSION: Open reduction and internal fixation right knee and right ankle fractures as  noted above. Electronically Signed   By: Genia Del M.D.   On: 05/15/2018 17:57   Dg Tibia/fibula Right  Result Date: 05/14/2018 CLINICAL DATA:  Pain after fall EXAM: RIGHT TIBIA AND FIBULA - 2 VIEW COMPARISON:  None. FINDINGS: There is a comminuted depressed fracture through the lateral tibial plateau. There is a comminuted fracture through the distal fibular diaphysis. There is a lucency through the distal medial malleolus with a step-off. These hemarthrosis in the suprapatellar fossa. IMPRESSION: 1. Depressed comminuted lateral tibial plateau fracture. 2. Comminuted fracture through the distal fibular diaphysis. 3. Fracture through the distal medial malleolus. Electronically Signed   By: Dorise Bullion III M.D   On: 05/14/2018 00:14   Dg Ankle Complete Right  Result Date: 05/14/2018 CLINICAL DATA:  Pain after fall EXAM: RIGHT ANKLE - COMPLETE 3+ VIEW COMPARISON:  None. FINDINGS: There is a comminuted fracture through the distal fibular diaphysis. There is a fracture through the medial malleolus. There also be  appears to be a fracture through the distal tibia where it abuts the fibula. Irregularity of the distal most fibula is consistent with an age indeterminate fracture but may not be acute. Soft tissue swelling. IMPRESSION: 1. Fracture through the medial malleolus. 2. Apparent fracture through the lateral aspect of the distal tibia with displacement. 3. Fracture through the fibular diaphysis distally. 4. Irregularity of the distal tip of the fibula may be nonacute from previous injury. 5. No definitive posterior malleolar fracture. However, given the complex nature of the ankle fracture, consider a CT scan for better evaluation. Electronically Signed   By: Dorise Bullion III M.D   On: 05/14/2018 00:17   Ct Head Wo Contrast  Result Date: 05/13/2018 CLINICAL DATA:  67 year old female with facial trauma. EXAM: CT HEAD WITHOUT CONTRAST CT MAXILLOFACIAL WITHOUT CONTRAST CT CERVICAL SPINE WITHOUT CONTRAST TECHNIQUE: Multidetector CT imaging of the head, cervical spine, and maxillofacial structures were performed using the standard protocol without intravenous contrast. Multiplanar CT image reconstructions of the cervical spine and maxillofacial structures were also generated. COMPARISON:  Head CT dated 07/23/2007 FINDINGS: CT HEAD FINDINGS Brain: The ventricles and sulci appropriate size for patient's age. The gray-white matter discrimination is preserved. There is no acute intracranial hemorrhage. No mass effect or midline shift. No extra-axial fluid collection. Vascular: No hyperdense vessel or unexpected calcification. Skull: Normal. Negative for fracture or focal lesion. Other: None. CT MAXILLOFACIAL FINDINGS Osseous: No fracture or mandibular dislocation. No destructive process. Orbits: Negative. No traumatic or inflammatory finding. Sinuses: Clear. Soft tissues: Negative. CT CERVICAL SPINE FINDINGS Alignment: No acute subluxation. Straightening of normal cervical lordosis which may be positional or due to muscle  spasm. Skull base and vertebrae: No acute fracture. Soft tissues and spinal canal: No prevertebral fluid or swelling. No visible canal hematoma. Disc levels:  Degenerative changes at C5-C6. Upper chest: Negative. Other: None IMPRESSION: 1. No acute intracranial pathology. 2. No acute facial bone fractures. 3. No acute/traumatic cervical spine pathology. Electronically Signed   By: Anner Crete M.D.   On: 05/13/2018 23:40   Ct Chest W Contrast  Result Date: 05/14/2018 CLINICAL DATA:  67 year old female with trauma. EXAM: CT CHEST, ABDOMEN, AND PELVIS WITH CONTRAST TECHNIQUE: Multidetector CT imaging of the chest, abdomen and pelvis was performed following the standard protocol during bolus administration of intravenous contrast. CONTRAST:  171mL ISOVUE-300 IOPAMIDOL (ISOVUE-300) INJECTION 61% COMPARISON:  CT of the abdomen pelvis dated 12/02/2016 FINDINGS: CT CHEST FINDINGS Cardiovascular: There is mild cardiomegaly. No pericardial effusion. Multi vessel coronary  vascular calcification. Mild atherosclerotic calcification of the thoracic aorta. No aneurysmal dilatation or dissection. The visualized origins of the great vessels of the aortic arch are patent. The central pulmonary arteries are grossly unremarkable. Mediastinum/Nodes: No hilar or mediastinal adenopathy. Esophagus and thyroid gland are grossly unremarkable. No mediastinal fluid collection. Lungs/Pleura: Bibasilar linear atelectasis/scarring. The lungs are clear. There is no pleural effusion or pneumothorax. The central airways are patent. Musculoskeletal: No chest wall mass or suspicious bone lesions identified. CT ABDOMEN PELVIS FINDINGS No intra-abdominal free air or free fluid. Hepatobiliary: The liver is unremarkable. Cholecystectomy. Mild intrahepatic biliary ductal dilatation. Pancreas: Unremarkable. No pancreatic ductal dilatation or surrounding inflammatory changes. Spleen: Normal in size without focal abnormality. Adrenals/Urinary Tract:  The adrenal glands are unremarkable. Small nonobstructing right renal inferior pole hypodense focus which is too small to characterize. There is no hydronephrosis on either side. There is symmetric enhancement and excretion of contrast by both kidneys. The visualized ureters and urinary bladder appear unremarkable. Stomach/Bowel: There is sigmoid diverticulosis without active inflammatory changes. A gastric lap band is noted. There is no bowel obstruction or active inflammation. The appendix is not visualized with certainty. No inflammatory changes identified in the right lower quadrant. Vascular/Lymphatic: Moderate aortoiliac atherosclerotic disease. No portal venous gas. There is no adenopathy. Reproductive: The uterus is anteverted and grossly unremarkable. Other: None Musculoskeletal: Grade 1 L4-L5 anterolisthesis. No acute osseous pathology. IMPRESSION: No acute intrathoracic, abdominal, or pelvic pathology. Electronically Signed   By: Anner Crete M.D.   On: 05/14/2018 02:42   Ct Cervical Spine Wo Contrast  Result Date: 05/13/2018 CLINICAL DATA:  67 year old female with facial trauma. EXAM: CT HEAD WITHOUT CONTRAST CT MAXILLOFACIAL WITHOUT CONTRAST CT CERVICAL SPINE WITHOUT CONTRAST TECHNIQUE: Multidetector CT imaging of the head, cervical spine, and maxillofacial structures were performed using the standard protocol without intravenous contrast. Multiplanar CT image reconstructions of the cervical spine and maxillofacial structures were also generated. COMPARISON:  Head CT dated 07/23/2007 FINDINGS: CT HEAD FINDINGS Brain: The ventricles and sulci appropriate size for patient's age. The gray-white matter discrimination is preserved. There is no acute intracranial hemorrhage. No mass effect or midline shift. No extra-axial fluid collection. Vascular: No hyperdense vessel or unexpected calcification. Skull: Normal. Negative for fracture or focal lesion. Other: None. CT MAXILLOFACIAL FINDINGS Osseous: No  fracture or mandibular dislocation. No destructive process. Orbits: Negative. No traumatic or inflammatory finding. Sinuses: Clear. Soft tissues: Negative. CT CERVICAL SPINE FINDINGS Alignment: No acute subluxation. Straightening of normal cervical lordosis which may be positional or due to muscle spasm. Skull base and vertebrae: No acute fracture. Soft tissues and spinal canal: No prevertebral fluid or swelling. No visible canal hematoma. Disc levels:  Degenerative changes at C5-C6. Upper chest: Negative. Other: None IMPRESSION: 1. No acute intracranial pathology. 2. No acute facial bone fractures. 3. No acute/traumatic cervical spine pathology. Electronically Signed   By: Anner Crete M.D.   On: 05/13/2018 23:40   Ct Knee Right Wo Contrast  Result Date: 05/14/2018 CLINICAL DATA:  67 year old female with fall and right knee pain. EXAM: CT OF THE right KNEE WITHOUT CONTRAST TECHNIQUE: Multidetector CT imaging of the right knee was performed according to the standard protocol. Multiplanar CT image reconstructions were also generated. COMPARISON:  Earlier radiograph dated 05/13/2018 FINDINGS: Bones/Joint/Cartilage There is a comminuted intra-articular fracture of the proximal tibia with mild depression of the lateral tibial plateau. The bones are osteopenic. There is no dislocation. There is a large suprapatellar lipohemarthrosis. Ligaments Suboptimally assessed by CT. Muscles and Tendons  No acute findings. No intramuscular hematoma. Soft tissues There is diffuse subcutaneous stranding of the lateral knee. No fluid collection. IMPRESSION: 1. Comminuted intra-articular fracture of the proximal tibia with mild depression of the lateral tibial plateau. 2. Large suprapatellar lipohemarthrosis. Electronically Signed   By: Anner Crete M.D.   On: 05/14/2018 02:33   Ct Abdomen Pelvis W Contrast  Result Date: 05/14/2018 CLINICAL DATA:  67 year old female with trauma. EXAM: CT CHEST, ABDOMEN, AND PELVIS WITH  CONTRAST TECHNIQUE: Multidetector CT imaging of the chest, abdomen and pelvis was performed following the standard protocol during bolus administration of intravenous contrast. CONTRAST:  184mL ISOVUE-300 IOPAMIDOL (ISOVUE-300) INJECTION 61% COMPARISON:  CT of the abdomen pelvis dated 12/02/2016 FINDINGS: CT CHEST FINDINGS Cardiovascular: There is mild cardiomegaly. No pericardial effusion. Multi vessel coronary vascular calcification. Mild atherosclerotic calcification of the thoracic aorta. No aneurysmal dilatation or dissection. The visualized origins of the great vessels of the aortic arch are patent. The central pulmonary arteries are grossly unremarkable. Mediastinum/Nodes: No hilar or mediastinal adenopathy. Esophagus and thyroid gland are grossly unremarkable. No mediastinal fluid collection. Lungs/Pleura: Bibasilar linear atelectasis/scarring. The lungs are clear. There is no pleural effusion or pneumothorax. The central airways are patent. Musculoskeletal: No chest wall mass or suspicious bone lesions identified. CT ABDOMEN PELVIS FINDINGS No intra-abdominal free air or free fluid. Hepatobiliary: The liver is unremarkable. Cholecystectomy. Mild intrahepatic biliary ductal dilatation. Pancreas: Unremarkable. No pancreatic ductal dilatation or surrounding inflammatory changes. Spleen: Normal in size without focal abnormality. Adrenals/Urinary Tract: The adrenal glands are unremarkable. Small nonobstructing right renal inferior pole hypodense focus which is too small to characterize. There is no hydronephrosis on either side. There is symmetric enhancement and excretion of contrast by both kidneys. The visualized ureters and urinary bladder appear unremarkable. Stomach/Bowel: There is sigmoid diverticulosis without active inflammatory changes. A gastric lap band is noted. There is no bowel obstruction or active inflammation. The appendix is not visualized with certainty. No inflammatory changes identified in  the right lower quadrant. Vascular/Lymphatic: Moderate aortoiliac atherosclerotic disease. No portal venous gas. There is no adenopathy. Reproductive: The uterus is anteverted and grossly unremarkable. Other: None Musculoskeletal: Grade 1 L4-L5 anterolisthesis. No acute osseous pathology. IMPRESSION: No acute intrathoracic, abdominal, or pelvic pathology. Electronically Signed   By: Anner Crete M.D.   On: 05/14/2018 02:42   Ct Ankle Right Wo Contrast  Result Date: 05/14/2018 CLINICAL DATA:  67 year old female with fall and right ankle pain. EXAM: CT OF THE RIGHT ANKLE WITHOUT CONTRAST TECHNIQUE: Multidetector CT imaging of the right ankle was performed according to the standard protocol. Multiplanar CT image reconstructions were also generated. COMPARISON:  Right ankle radiograph dated 05/13/2018 FINDINGS: Bones/Joint/Cartilage There is a comminuted fracture of the distal fibula with extension of the fracture into the distal tibia-fibular syndesmosis. There is a minimally displaced fracture of the lateral aspect of the tibial plafond and nondisplaced fracture of the posterior malleolus. There is a minimally displaced fracture of the medial malleolus. There bones are osteopenic. Discontinuity of the plantar cortex of the medial cuneiform (series 5, image 58 and sagittal series 6, image 89) concerning for an avulsion fracture. There is also linear lucency through the plantar base of the first metatarsal (series 6, image 90) also concerning for an avulsion corner fracture. Probable nondisplaced fracture of the dorsal aspect of the medial cuneiform (series 4, image 146 and sagittal series 6, image 69). Fracture of the base of the second metatarsal (series 6, image 73 and series 4, image 161). Nondisplaced linear  lucency through the proximal fourth metatarsal (series 6, image 58) may be artifactual or represent a vascular groove or a nondisplaced fracture. No dislocation. Diffuse soft tissue edema. Ligaments  Suboptimally assessed by CT. Muscles and Tendons No acute intramuscular findings. Soft tissues Diffuse subcutaneous edema. IMPRESSION: 1. Comminuted fracture of the distal fibula with extension of the fracture into the distal tibia-fibular syndesmosis. 2. Minimally displaced fracture of the lateral aspect of the tibial plafond and nondisplaced fracture of the posterior malleolus. 3. Minimally displaced fracture of the medial malleolus. 4. Nondisplaced fracture of the base of the second metatarsal. 5. Linear lucency through the plantar cortex of the medial cuneiform concerning for an avulsion fracture. 6. Linear lucency through the proximal fourth metatarsal may be artifactual or represent a nondisplaced fracture. Electronically Signed   By: Anner Crete M.D.   On: 05/14/2018 02:55   Dg Shoulder Left  Result Date: 05/14/2018 CLINICAL DATA:  General left shoulder pain.  Pain after fall. EXAM: LEFT SHOULDER - 2+ VIEW COMPARISON:  None. FINDINGS: There is no evidence of fracture or dislocation. There is no evidence of arthropathy or other focal bone abnormality. Soft tissues are unremarkable. IMPRESSION: Negative. Electronically Signed   By: Dorise Bullion III M.D   On: 05/14/2018 00:07   Dg Knee Right Port  Result Date: 05/15/2018 CLINICAL DATA:  Postoperative right knee EXAM: PORTABLE RIGHT KNEE - 1-2 VIEW COMPARISON:  Intraoperative fluoroscopy 05/15/2018 FINDINGS: Postoperative changes with lateral plate and screw fixation of the proximal tibial metaphysis and lateral tibial plateau. Hardware components appear intact and well seated. Degenerative changes in the right knee. Soft tissue gas consistent with recent surgery. IMPRESSION: Postoperative internal fixation of lateral tibial plateau fracture. Electronically Signed   By: Lucienne Capers M.D.   On: 05/15/2018 22:06   Dg Ankle Right Port  Result Date: 05/15/2018 CLINICAL DATA:  Postoperative right ankle EXAM: PORTABLE RIGHT ANKLE - 2 VIEW  COMPARISON:  Intraoperative fluoroscopy 05/15/2018 FINDINGS: Postoperative changes with plate and screw fixation of a fracture of the distal fibular shaft and screw fixation of the medial malleolar fracture fragment. Hardware components appear intact and well seated. Near-anatomic alignment of the fracture fragments. Overlying splint material obscures bone detail. IMPRESSION: Postoperative internal fixation of fractures of the right distal fibula and medial malleolus. Electronically Signed   By: Lucienne Capers M.D.   On: 05/15/2018 22:08   Dg Hand Complete Right  Result Date: 05/14/2018 CLINICAL DATA:  67 year old female with fall and trauma to the right hand. EXAM: RIGHT HAND - COMPLETE 3+ VIEW COMPARISON:  Right wrist radiograph dated 05/13/2018 FINDINGS: Comminuted mildly displaced intra-articular fracture of the distal radius as seen on the earlier wrist radiograph. No other acute fracture noted. The bones are osteopenic. No dislocation. Soft tissue swelling of the wrist. IMPRESSION: Comminuted mildly displaced intra-articular fracture of the distal radius. Electronically Signed   By: Anner Crete M.D.   On: 05/14/2018 01:09   Dg C-arm 1-60 Min  Result Date: 05/15/2018 CLINICAL DATA:  67 year old female with right distal radial fracture. Subsequent encounter. EXAM: DG C-ARM 61-120 MIN; RIGHT WRIST - COMPLETE 3+ VIEW Fluoroscopic time: 49.7 seconds. COMPARISON:  05/14/2018. FINDINGS: Five intraoperative C-arm views submitted for review after surgery. Distal right radial fracture reduced utilizing plate and screws. Much better alignment of fracture fragments with minimal separation/incongruity of articular surface. IMPRESSION: Open reduction and internal fixation distal right radius fracture. Electronically Signed   By: Genia Del M.D.   On: 05/15/2018 20:07   Dg  C-arm 1-60 Min  Result Date: 05/15/2018 CLINICAL DATA:  67 year old female with tibial plateau fracture and right ankle fracture.  Subsequent encounter. EXAM: RIGHT TIBIA AND FIBULA - 2 VIEW; DG C-ARM 61-120 MIN Fluoroscopic time: 3 minutes and 8 seconds. COMPARISON:  05/14/2018 CT. FINDINGS: Eleven intraoperative film submitted for review after surgery. Six intraoperative C-arm views of the right knee: Reduction of lateral tibial plateau fracture with sideplate and screws. Articular surface with better alignment although with slight depression. Five intraoperative C-arm views of the right ankle: Reduction of fibular fracture with sideplate and screws. Screws transfix the distal fibula-tibia joint space. Reduction of medial malleolar fracture with screw. Evaluation of posterior tibial fracture limited secondary to overlying plate. IMPRESSION: Open reduction and internal fixation right knee and right ankle fractures as noted above. Electronically Signed   By: Genia Del M.D.   On: 05/15/2018 17:57   Dg C-arm 1-60 Min  Result Date: 05/15/2018 CLINICAL DATA:  67 year old female with tibial plateau fracture and right ankle fracture. Subsequent encounter. EXAM: RIGHT TIBIA AND FIBULA - 2 VIEW; DG C-ARM 61-120 MIN Fluoroscopic time: 3 minutes and 8 seconds. COMPARISON:  05/14/2018 CT. FINDINGS: Eleven intraoperative film submitted for review after surgery. Six intraoperative C-arm views of the right knee: Reduction of lateral tibial plateau fracture with sideplate and screws. Articular surface with better alignment although with slight depression. Five intraoperative C-arm views of the right ankle: Reduction of fibular fracture with sideplate and screws. Screws transfix the distal fibula-tibia joint space. Reduction of medial malleolar fracture with screw. Evaluation of posterior tibial fracture limited secondary to overlying plate. IMPRESSION: Open reduction and internal fixation right knee and right ankle fractures as noted above. Electronically Signed   By: Genia Del M.D.   On: 05/15/2018 17:57   Ct Maxillofacial Wo Contrast  Result  Date: 05/13/2018 CLINICAL DATA:  67 year old female with facial trauma. EXAM: CT HEAD WITHOUT CONTRAST CT MAXILLOFACIAL WITHOUT CONTRAST CT CERVICAL SPINE WITHOUT CONTRAST TECHNIQUE: Multidetector CT imaging of the head, cervical spine, and maxillofacial structures were performed using the standard protocol without intravenous contrast. Multiplanar CT image reconstructions of the cervical spine and maxillofacial structures were also generated. COMPARISON:  Head CT dated 07/23/2007 FINDINGS: CT HEAD FINDINGS Brain: The ventricles and sulci appropriate size for patient's age. The gray-white matter discrimination is preserved. There is no acute intracranial hemorrhage. No mass effect or midline shift. No extra-axial fluid collection. Vascular: No hyperdense vessel or unexpected calcification. Skull: Normal. Negative for fracture or focal lesion. Other: None. CT MAXILLOFACIAL FINDINGS Osseous: No fracture or mandibular dislocation. No destructive process. Orbits: Negative. No traumatic or inflammatory finding. Sinuses: Clear. Soft tissues: Negative. CT CERVICAL SPINE FINDINGS Alignment: No acute subluxation. Straightening of normal cervical lordosis which may be positional or due to muscle spasm. Skull base and vertebrae: No acute fracture. Soft tissues and spinal canal: No prevertebral fluid or swelling. No visible canal hematoma. Disc levels:  Degenerative changes at C5-C6. Upper chest: Negative. Other: None IMPRESSION: 1. No acute intracranial pathology. 2. No acute facial bone fractures. 3. No acute/traumatic cervical spine pathology. Electronically Signed   By: Anner Crete M.D.   On: 05/13/2018 23:40    Microbiology: Recent Results (from the past 240 hour(s))  MRSA PCR Screening     Status: None   Collection Time: 05/14/18  9:50 PM  Result Value Ref Range Status   MRSA by PCR NEGATIVE NEGATIVE Final    Comment:        The GeneXpert MRSA Assay (  FDA approved for NASAL specimens only), is one  component of a comprehensive MRSA colonization surveillance program. It is not intended to diagnose MRSA infection nor to guide or monitor treatment for MRSA infections. Performed at [facility], [place] [address], [place], [place] [address]      Labs: Basic Metabolic Panel: Recent Labs  Lab 05/18/18 0231 05/19/18 0131 05/20/18 0307 05/21/18 0244 05/22/18 0224  NA 135 133* 134* 135 135  K 4.1 3.9 3.7 4.3 3.7  CL 98 96* 98 103 102  CO2 28 29 28 25 28   GLUCOSE 115* 113* 104* 95 86  BUN 9 15 14 14 9   CREATININE 0.55 0.57 0.59 0.57 0.67  CALCIUM 8.3* 8.3* 8.1* 8.2* 8.3*   Liver Function Tests: Recent Labs  Lab 05/20/18 0307 05/21/18 0244 05/22/18 0224  AST 17 20 23   ALT 38 31 45*  ALKPHOS 59 55 69  BILITOT 0.8 1.6* 0.8  PROT 5.0* 5.0* 5.1*  ALBUMIN 2.2* 2.3* 2.4*   No results for input(s): LIPASE, AMYLASE in the last 168 hours. No results for input(s): AMMONIA in the last 168 hours. CBC: Recent Labs  Lab 05/18/18 0231 05/19/18 0131 05/20/18 0307 05/21/18 0244 05/22/18 0224  WBC 8.7 8.6 8.2 8.8 7.9  NEUTROABS 6.1 6.2 5.9 6.3 5.0  HGB 7.9* 7.7* 7.9* 8.3* 8.5*  HCT 25.2* 24.4* 25.2* 26.8* 27.4*  MCV 93.7 93.1 93.3 93.4 94.5  PLT 215 258 308 368 350   Cardiac Enzymes: No results for input(s): CKTOTAL, CKMB, CKMBINDEX, TROPONINI in the last 168 hours. BNP: BNP (last 3 results) No results for input(s): BNP in the last 8760 hours.  ProBNP (last 3 results) No results for input(s): PROBNP in the last 8760 hours.  CBG: No results for input(s): GLUCAP in the last 168 hours.     Signed:  Nita Sells MD   [facility] 05/22/2018, 8:43 AM

## 2018-05-22 NOTE — Progress Notes (Signed)
Location:    [facility] Room Number: 131/P Place of Service:  SNF (31) Provider: Willaim Rayas, MD  Patient Care Team: Redmond School, MD as PCP - General (Internal Medicine) Dorothy Spark, MD as PCP - Cardiology (Cardiology)  Extended Emergency Contact Information Primary Emergency Contact: [facility] Address: [address]          [place], [place] [address] [place] Phone: [phone] Mobile Phone: [phone] Relation: Spouse  Code Status:  Full Code Goals of care: Advanced Directive information Advanced Directives 05/22/2018  Does Patient Have a Medical Advance Directive? Yes  Type of Advance Directive (No Data)  Does patient want to make changes to medical advance directive? No - Patient declined  Copy of [place] in Chart? -  Would patient like information on creating a medical advance directive? No - Patient declined     Chief Complaint  Patient presents with  . Hospitalization Follow-up    Hospitalization F/U Visit  Status post hospitalization for numerous fractures including a right tibial plateau fracture as well as trimalleolar fracture and a right distal radius fracture.  Also sustained after a fall    HPI:  Pt is a 67 y.o. female seen today for follow-up of hospitalization for the above-stated fractures.  Patient has a previous history of rheumatoid arthritis-recent diagnosis of lupus-coronary artery disease status post stenting- as well as chronic diastolic CHF on chronic Lasix in addition to hypertension and a history of TIAs.  She fell down her stairs at home apparently doing a Therapist, music.  She presented with severe pain in her right wrist right knee and right ankle.  She was found to have a right distal radius fracture right tibial plateau fracture and a right ankle fracture.  These were surgical repaired and she did have plates inserted in  regards to the tibial plateau fracture as well as the radius fracture.  She does have a brace on her right lower arm as well as orthopedic stabilizer on her right lower leg.  In regards to pain she continues on Dilaudid 2 mg every 3 hours as needed as well as PRN Tylenol and Neurontin 3 times a day at 300 mg-she had been on ibuprofen but apparently there is a drug internation here with her Plavix and will hold this and monitor.  She will have surgical follow-up.  She did have anemia this thought to be an element of chronic disease on top of acute blood loss hemoglobin today show some improvement at 8.5 this will need to be monitored.  She also has a history of diastolic CHF continues on Lasix.  In regards to her rheumatoid arthritis Ortho has been requesting to hold the DM ARD and methotrexate until November 21 she is on prednisone 20 mg a day.  She also has a history of coronary artery disease with stenting this appears stable currently she is on aspirin Plavix metoprolol and ramipril.  She also has a history of gout continues on routine allopurinol.  She is also been on Nexium for GI protection but again there is an interaction here with her other medications and this has been changed to Protonix.  Today she says she still has pain but the medications are helping take the edge off it-she is very motivated for therapy to get back up on her feet.  She is married and has a very supportive husband they do live in [place] and have  a supportive family she tells me she has 15 grandchildren.  Vital signs appear to be stable temperature is mildly elevated at 99.9 she denies any fever chills dysuria chest congestion or cough or diarrhea this will have to be watched-      Past Medical History:  Diagnosis Date  . CAD (coronary artery disease)    a. PTCA LAD 2001/cath 2002-prox and mid LAD stents patent, PTCA ostium Dx 1 b. cath 03/2016: patent LAD stent with less than 40% in-stent  restenosis, 10-30% stenosis of D1 and distal LAD with continued medical management recommended.  . Candida esophagitis ([place])   . Carotid artery disease ([place])    Doppler September, 2011, 0-39% bilateral disease mild  . Chronic diastolic CHF (congestive heart failure) ([place])   . Edema   . Emotional stress reaction    8/11  . HTN (hypertension)   . Hyperlipidemia   . Leg pain    July, 2012, Arterial Dopplers March 08, 2011 normal  . 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 PONV (postoperative nausea and vomiting)   . Rheumatoid arthritis ([place])   . Right rotator cuff tear 11/16/2013  . Stroke [facility])    mini stroke in past ???   Past Surgical History:  Procedure Laterality Date  . CARDIAC CATHETERIZATION     with stent placement in 2001  . CARDIAC CATHETERIZATION N/A 03/12/2016   Procedure: Left Heart Cath and Coronary Angiography;  Surgeon: Nelva Bush, MD;  Location: [facility] CV LAB;  Service: Cardiovascular;  Laterality: N/A;  . CARDIAC CATHETERIZATION N/A 03/12/2016   Procedure: Intravascular Pressure Wire/FFR Study;  Surgeon: Nelva Bush, MD;  Location: [facility] CV LAB;  Service: Cardiovascular;  Laterality: N/A;  . CHOLECYSTECTOMY    . CORONARY ANGIOPLASTY     2002  . ESOPHAGOGASTRODUODENOSCOPY N/A 12/03/2016   Procedure: ESOPHAGOGASTRODUODENOSCOPY (EGD);  Surgeon: Carol Ada, MD;  Location: [facility];  Service: Endoscopy;  Laterality: N/A;  . laproscopic adjustable gastric  banding     with APS standard system.   . OPEN REDUCTION INTERNAL FIXATION (ORIF) DISTAL RADIAL FRACTURE Right 05/15/2018   Procedure: OPEN REDUCTION INTERNAL FIXATION (ORIF) DISTAL RADIAL FRACTURE;  Surgeon: Shona Needles, MD;  Location: [facility];  Service: Orthopedics;  Laterality: Right;  . ORIF ANKLE FRACTURE Right 05/15/2018   Procedure: OPEN REDUCTION INTERNAL FIXATION (ORIF)  ANKLE FRACTURE;  Surgeon: Shona Needles, MD;  Location: [facility];  Service: Orthopedics;  Laterality: Right;  . ORIF TIBIA PLATEAU Right 05/15/2018   Procedure: OPEN REDUCTION INTERNAL FIXATION (ORIF) TIBIAL PLATEAU;  Surgeon: Shona Needles, MD;  Location: [facility];  Service: Orthopedics;  Laterality: Right;  . Pt has 3 stents     sept 2001  . SHOULDER ARTHROSCOPY WITH SUBACROMIAL DECOMPRESSION, ROTATOR CUFF REPAIR AND BICEP TENDON REPAIR Right 11/16/2013   Procedure: RIGHT SHOULDER ARTHROSCOPY, EXTENSIVE DEBRIDEMENT, ROTATOR CUFF REPAIR ;  Surgeon: [name], MD;  Location: [facility];  Service: Orthopedics;  Laterality: Right;  . TUBAL LIGATION      Allergies  Allergen Reactions  . Codeine Phosphate Shortness Of Breath    REACTION: unspecified  . Amoxicillin Nausea And Vomiting    REACTION: unspecified  . Penicillins Nausea And Vomiting    Has patient had a PCN reaction causing immediate rash, facial/tongue/throat swelling, SOB or lightheadedness with hypotension:YES Has patient had a PCN reaction causing  severe rash involving mucus membranes or skin necrosis: NO Has patient had a PCN reaction that required hospitalization NO Has patient had a PCN reaction occurring within the last 10 years: NO If all of the above answers are "NO", then may proceed with Cephalosporin use.  Marland Kitchen Morphine And Related Nausea And Vomiting  . Lidocaine Rash    Patch caused rash    Allergies as of 05/22/2018      Reactions   Codeine Phosphate Shortness Of Breath   REACTION: unspecified   Amoxicillin Nausea And Vomiting   REACTION: unspecified   Penicillins Nausea And Vomiting   Has patient had a PCN reaction causing immediate rash, facial/tongue/throat swelling, SOB or lightheadedness with hypotension:YES Has patient had a PCN reaction causing severe rash involving mucus membranes or skin necrosis: NO Has patient had a PCN reaction that required hospitalization NO Has patient had a PCN  reaction occurring within the last 10 years: NO If all of the above answers are "NO", then may proceed with Cephalosporin use.   Morphine And Related Nausea And Vomiting   Lidocaine Rash   Patch caused rash      Medication List        Accurate as of 05/22/18  2:29 PM. Always use your most recent med list.          acetaminophen 325 MG tablet Commonly known as:  TYLENOL Take 2 tablets (650 mg total) by mouth every 6 (six) hours.   allopurinol 100 MG tablet Commonly known as:  ZYLOPRIM Take 400 mg by mouth daily.   aspirin 81 MG tablet Take 81 mg by mouth daily.   atorvastatin 40 MG tablet Commonly known as:  LIPITOR Take 2 tablets (80 mg total) by mouth daily at 6 PM.   clopidogrel 75 MG tablet Commonly known as:  PLAVIX Take 1 tablet (75 mg total) by mouth daily.   esomeprazole 40 MG capsule Commonly known as:  NEXIUM Take 1 capsule (40 mg total) by mouth 2 (two) times daily before a meal. For acid reflux   ezetimibe 10 MG tablet Commonly known as:  ZETIA Take 1 tablet (10 mg total) by mouth daily.   ferrous sulfate 325 (65 FE) MG tablet Take 1 tablet (325 mg total) by mouth 3 (three) times daily with meals.   Fish Oil 1000 MG Caps Take 1 capsule by mouth daily.   folic acid 1 MG tablet Commonly known as:  FOLVITE Take 1 mg by mouth daily. Takes with chemo medication.   furosemide 40 MG tablet Commonly known as:  LASIX TAKE 1 TABLET BY MOUTH  DAILY AND MAY TAKE AN  ADDITIONAL 1 TABLET DAILY  AS NEEDED FOR SWELLING.   gabapentin 300 MG capsule Commonly known as:  NEURONTIN Take 1 capsule (300 mg total) by mouth 3 (three) times daily.   HYDROmorphone 2 MG tablet Commonly known as:  DILAUDID Take 1 tablet (2 mg total) by mouth every 3 (three) hours as needed for severe pain.   ibuprofen 800 MG tablet Commonly known as:  ADVIL,MOTRIN Take 1 tablet (800 mg total) by mouth 3 (three) times daily.   levocetirizine 5 MG tablet Commonly known as:   XYZAL Take 1 tablet by mouth daily.   metoprolol tartrate 50 MG tablet Commonly known as:  LOPRESSOR TAKE 1 TABLET BY MOUTH ONCE DAILY   nitroGLYCERIN 0.4 MG SL tablet Commonly known as:  NITROSTAT DISSOLVE 1 TABLET UNDER THE TONGUE EVERY 5 MINUTES AS  NEEDED (NOT TO EXCEED  3  TABLETS IN 15 MINS, CALL  911 IF CHEST PAIN PERSISTS   predniSONE 20 MG tablet Commonly known as:  DELTASONE Take 1 tablet (20 mg total) by mouth daily with breakfast.   ramipril 2.5 MG capsule Commonly known as:  ALTACE Take 1 capsule (2.5 mg total) by mouth daily.   senna-docusate 8.6-50 MG tablet Commonly known as:  Senokot-S Take 1 tablet by mouth daily as needed for mild constipation.       Review of Systems   In general she is not complaining of any fever chills says at times she will have night sweats.  Skin does not complain of rashes or itching does have some bruising of her left shoulder as well as surgical incisions right lower arm and right lower leg.  Head ears eyes nose mouth and throat is not complain of his sore throat or visual changes.  Respiratory is not complaining shortness of breath or cough.  Cardiac denies chest pain has quite mild lower extremity edema more so in her right leg status post surgery.  GI is not complaining of nausea vomiting diarrhea constipation at this time or abdominal pain.  GU denies dysuria.  Musculoskeletal continues to have some right arm and leg pain but says it is somewhat controlled on the current pain medications and they are taking the edge off the pain.  Neurologic does not complain of dizziness or headache has somewhat chronic numbness-apparently this is increased since the accident with the fractures.  Psych does not complain of being anxious or depressed does say she has insomnia There is no immunization history for the selected administration types on file for this patient. Pertinent  Health Maintenance Due  Topic Date Due  . INFLUENZA  VACCINE  06/21/2018 (Originally 02/09/2018)  . MAMMOGRAM  06/21/2018 (Originally 05/15/2011)  . DEXA SCAN  06/21/2018 (Originally 06/26/2016)  . COLONOSCOPY  06/21/2018 (Originally 06/26/2001)  . PNA vac Low Risk Adult (1 of 2 - PCV13) 06/21/2018 (Originally 06/26/2016)   No flowsheet data found. Functional Status Survey:    Vitals:   05/22/18 1417  BP: 121/72  Pulse: 90  Resp: 20  Temp: 99.9 F (37.7 C)  TempSrc: Oral  Manual blood pressure was 148/72 Physical Exam   In general this is a very pleasant female in no distress resting comfortably in bed.  Her skin is warm and dry she does have some violaceous bruising of her left shoulder- surgical site right lower arm is currently covered but I could not see any surrounding erythema or drainage.  Surgical site right lower leg appears to be healing unremarkably I do not see any drainage or surrounding erythema around the surgical site.  Eyes visual acuity appears to be intact sclera and conjunctive are clear.  Oropharynx is clear mucous membranes moist.  Chest is clear to auscultation there is no labored breathing.  Abdomen is soft nontender with positive bowel sounds.  Musculoskeletal does have brace applied to her right lower arm against surgical site from what I can tell looks benign- she does have grip strength radial pulse is intact on the right.  Right lower leg again surgical site appears to be healing unremarkably at this time she does have a brace applied with wrapping of her ankle area-she does have some edema of the foot touch sensation is intact of her toes bilaterally.  There is increased edema on the right surgical site compared to the left which appears to be postop dependent edema it is cool to  touch nonerythematous not acutely tender.  Neurologic is grossly intact her speech is clear touch sensation appears to be intact lower extremities bilaterally.  Psych she is alert and oriented pleasant and  appropriate  Labs reviewed: Recent Labs    05/15/18 0443  05/20/18 0307 05/21/18 0244 05/22/18 0224  NA 134*   < > 134* 135 135  K 4.5   < > 3.7 4.3 3.7  CL 103   < > 98 103 102  CO2 24   < > 28 25 28   GLUCOSE 115*   < > 104* 95 86  BUN 12   < > 14 14 9   CREATININE 0.54   < > 0.59 0.57 0.67  CALCIUM 8.8*   < > 8.1* 8.2* 8.3*  MG 2.1  --   --   --   --    < > = values in this interval not displayed.   Recent Labs    05/20/18 0307 05/21/18 0244 05/22/18 0224  AST 17 20 23   ALT 38 31 45*  ALKPHOS 59 55 69  BILITOT 0.8 1.6* 0.8  PROT 5.0* 5.0* 5.1*  ALBUMIN 2.2* 2.3* 2.4*   Recent Labs    05/20/18 0307 05/21/18 0244 05/22/18 0224  WBC 8.2 8.8 7.9  NEUTROABS 5.9 6.3 5.0  HGB 7.9* 8.3* 8.5*  HCT 25.2* 26.8* 27.4*  MCV 93.3 93.4 94.5  PLT 308 368 350   Lab Results  Component Value Date   TSH 2.398 03/11/2016   Lab Results  Component Value Date   HGBA1C 5.5 03/11/2016   Lab Results  Component Value Date   CHOL 164 09/30/2016   HDL 74 09/30/2016   LDLCALC 68 09/30/2016   TRIG 110 09/30/2016   CHOLHDL 2.2 09/30/2016    Significant Diagnostic Results in last 30 days:  Dg Chest 2 View  Result Date: 05/14/2018 CLINICAL DATA:  67 year old female with fall. History of CHF. EXAM: CHEST - 2 VIEW COMPARISON:  Chest CT dated 05/14/2018 FINDINGS: There is cardiomegaly with probable mild vascular congestion. No focal consolidation, pleural effusion, or pneumothorax. No acute osseous pathology. IMPRESSION: Cardiomegaly with probable mild vascular congestion. No focal consolidation. Electronically Signed   By: Anner Crete M.D.   On: 05/14/2018 01:10   Dg Pelvis 1-2 Views  Result Date: 05/14/2018 CLINICAL DATA:  Fall EXAM: PELVIS - 1-2 VIEW COMPARISON:  None. FINDINGS: There is no evidence of pelvic fracture or diastasis. No pelvic bone lesions are seen. IMPRESSION: Negative. Electronically Signed   By: Kerby Moors M.D.   On: 05/14/2018 01:10   Dg Forearm  Right  Result Date: 05/14/2018 CLINICAL DATA:  Fall.  Right wrist pain. EXAM: RIGHT FOREARM - 2 VIEW COMPARISON:  None. FINDINGS: There is a an acute intra-articular fracture deformity involving the distal radius. Dorsal angulation of the distal fracture fragments noted. No dislocation identified. IMPRESSION: 1. Acute fracture involves the dorsal aspect of the distal radius. Mild dorsal angulation of the distal fracture fragments. Electronically Signed   By: Kerby Moors M.D.   On: 05/14/2018 01:07   Dg Wrist Complete Left  Result Date: 05/14/2018 CLINICAL DATA:  67 year old female with fall and left wrist pain. EXAM: LEFT WRIST - COMPLETE 3+ VIEW COMPARISON:  None. FINDINGS: There is no acute fracture or dislocation. The bones are osteopenic. No significant arthritic changes. There is soft tissue swelling over the wrist and ulna. No radiopaque foreign object or soft tissue gas. IMPRESSION: No acute fracture or dislocation. Electronically Signed  By: Anner Crete M.D.   On: 05/14/2018 00:13   Dg Wrist Complete Right  Result Date: 05/14/2018 CLINICAL DATA:  Pain after fall EXAM: RIGHT WRIST - COMPLETE 3+ VIEW COMPARISON:  None. FINDINGS: There is a comminuted mildly displaced fracture through the distal radial metaphysis. IMPRESSION: Comminuted mildly displaced fracture through the distal radial metaphysis. Electronically Signed   By: Dorise Bullion III M.D   On: 05/14/2018 00:12   Dg Knee 2 Views Right  Result Date: 05/14/2018 CLINICAL DATA:  Pain after fall EXAM: RIGHT KNEE - 1-2 VIEW COMPARISON:  None. FINDINGS: A hemarthrosis is identified. There is a depressed lateral tibial plateau fracture which is comminuted. IMPRESSION: Comminuted mildly depressed lateral tibial plateau fracture with resulting hemarthrosis. Electronically Signed   By: Dorise Bullion III M.D   On: 05/14/2018 00:08   Dg Tibia/fibula Right  Result Date: 05/14/2018 CLINICAL DATA:  Pain after fall EXAM: RIGHT TIBIA AND  FIBULA - 2 VIEW COMPARISON:  None. FINDINGS: There is a comminuted depressed fracture through the lateral tibial plateau. There is a comminuted fracture through the distal fibular diaphysis. There is a lucency through the distal medial malleolus with a step-off. These hemarthrosis in the suprapatellar fossa. IMPRESSION: 1. Depressed comminuted lateral tibial plateau fracture. 2. Comminuted fracture through the distal fibular diaphysis. 3. Fracture through the distal medial malleolus. Electronically Signed   By: Dorise Bullion III M.D   On: 05/14/2018 00:14   Dg Ankle Complete Right  Result Date: 05/14/2018 CLINICAL DATA:  Pain after fall EXAM: RIGHT ANKLE - COMPLETE 3+ VIEW COMPARISON:  None. FINDINGS: There is a comminuted fracture through the distal fibular diaphysis. There is a fracture through the medial malleolus. There also be appears to be a fracture through the distal tibia where it abuts the fibula. Irregularity of the distal most fibula is consistent with an age indeterminate fracture but may not be acute. Soft tissue swelling. IMPRESSION: 1. Fracture through the medial malleolus. 2. Apparent fracture through the lateral aspect of the distal tibia with displacement. 3. Fracture through the fibular diaphysis distally. 4. Irregularity of the distal tip of the fibula may be nonacute from previous injury. 5. No definitive posterior malleolar fracture. However, given the complex nature of the ankle fracture, consider a CT scan for better evaluation. Electronically Signed   By: Dorise Bullion III M.D   On: 05/14/2018 00:17   Ct Head Wo Contrast  Result Date: 05/13/2018 CLINICAL DATA:  67 year old female with facial trauma. EXAM: CT HEAD WITHOUT CONTRAST CT MAXILLOFACIAL WITHOUT CONTRAST CT CERVICAL SPINE WITHOUT CONTRAST TECHNIQUE: Multidetector CT imaging of the head, cervical spine, and maxillofacial structures were performed using the standard protocol without intravenous contrast. Multiplanar CT  image reconstructions of the cervical spine and maxillofacial structures were also generated. COMPARISON:  Head CT dated 07/23/2007 FINDINGS: CT HEAD FINDINGS Brain: The ventricles and sulci appropriate size for patient's age. The gray-white matter discrimination is preserved. There is no acute intracranial hemorrhage. No mass effect or midline shift. No extra-axial fluid collection. Vascular: No hyperdense vessel or unexpected calcification. Skull: Normal. Negative for fracture or focal lesion. Other: None. CT MAXILLOFACIAL FINDINGS Osseous: No fracture or mandibular dislocation. No destructive process. Orbits: Negative. No traumatic or inflammatory finding. Sinuses: Clear. Soft tissues: Negative. CT CERVICAL SPINE FINDINGS Alignment: No acute subluxation. Straightening of normal cervical lordosis which may be positional or due to muscle spasm. Skull base and vertebrae: No acute fracture. Soft tissues and spinal canal: No prevertebral fluid or swelling. No  visible canal hematoma. Disc levels:  Degenerative changes at C5-C6. Upper chest: Negative. Other: None IMPRESSION: 1. No acute intracranial pathology. 2. No acute facial bone fractures. 3. No acute/traumatic cervical spine pathology. Electronically Signed   By: Anner Crete M.D.   On: 05/13/2018 23:40   Ct Chest W Contrast  Result Date: 05/14/2018 CLINICAL DATA:  67 year old female with trauma. EXAM: CT CHEST, ABDOMEN, AND PELVIS WITH CONTRAST TECHNIQUE: Multidetector CT imaging of the chest, abdomen and pelvis was performed following the standard protocol during bolus administration of intravenous contrast. CONTRAST:  145mL ISOVUE-300 IOPAMIDOL (ISOVUE-300) INJECTION 61% COMPARISON:  CT of the abdomen pelvis dated 12/02/2016 FINDINGS: CT CHEST FINDINGS Cardiovascular: There is mild cardiomegaly. No pericardial effusion. Multi vessel coronary vascular calcification. Mild atherosclerotic calcification of the thoracic aorta. No aneurysmal dilatation or  dissection. The visualized origins of the great vessels of the aortic arch are patent. The central pulmonary arteries are grossly unremarkable. Mediastinum/Nodes: No hilar or mediastinal adenopathy. Esophagus and thyroid gland are grossly unremarkable. No mediastinal fluid collection. Lungs/Pleura: Bibasilar linear atelectasis/scarring. The lungs are clear. There is no pleural effusion or pneumothorax. The central airways are patent. Musculoskeletal: No chest wall mass or suspicious bone lesions identified. CT ABDOMEN PELVIS FINDINGS No intra-abdominal free air or free fluid. Hepatobiliary: The liver is unremarkable. Cholecystectomy. Mild intrahepatic biliary ductal dilatation. Pancreas: Unremarkable. No pancreatic ductal dilatation or surrounding inflammatory changes. Spleen: Normal in size without focal abnormality. Adrenals/Urinary Tract: The adrenal glands are unremarkable. Small nonobstructing right renal inferior pole hypodense focus which is too small to characterize. There is no hydronephrosis on either side. There is symmetric enhancement and excretion of contrast by both kidneys. The visualized ureters and urinary bladder appear unremarkable. Stomach/Bowel: There is sigmoid diverticulosis without active inflammatory changes. A gastric lap band is noted. There is no bowel obstruction or active inflammation. The appendix is not visualized with certainty. No inflammatory changes identified in the right lower quadrant. Vascular/Lymphatic: Moderate aortoiliac atherosclerotic disease. No portal venous gas. There is no adenopathy. Reproductive: The uterus is anteverted and grossly unremarkable. Other: None Musculoskeletal: Grade 1 L4-L5 anterolisthesis. No acute osseous pathology. IMPRESSION: No acute intrathoracic, abdominal, or pelvic pathology. Electronically Signed   By: Anner Crete M.D.   On: 05/14/2018 02:42   Ct Cervical Spine Wo Contrast  Result Date: 05/13/2018 CLINICAL DATA:  67 year old female  with facial trauma. EXAM: CT HEAD WITHOUT CONTRAST CT MAXILLOFACIAL WITHOUT CONTRAST CT CERVICAL SPINE WITHOUT CONTRAST TECHNIQUE: Multidetector CT imaging of the head, cervical spine, and maxillofacial structures were performed using the standard protocol without intravenous contrast. Multiplanar CT image reconstructions of the cervical spine and maxillofacial structures were also generated. COMPARISON:  Head CT dated 07/23/2007 FINDINGS: CT HEAD FINDINGS Brain: The ventricles and sulci appropriate size for patient's age. The gray-white matter discrimination is preserved. There is no acute intracranial hemorrhage. No mass effect or midline shift. No extra-axial fluid collection. Vascular: No hyperdense vessel or unexpected calcification. Skull: Normal. Negative for fracture or focal lesion. Other: None. CT MAXILLOFACIAL FINDINGS Osseous: No fracture or mandibular dislocation. No destructive process. Orbits: Negative. No traumatic or inflammatory finding. Sinuses: Clear. Soft tissues: Negative. CT CERVICAL SPINE FINDINGS Alignment: No acute subluxation. Straightening of normal cervical lordosis which may be positional or due to muscle spasm. Skull base and vertebrae: No acute fracture. Soft tissues and spinal canal: No prevertebral fluid or swelling. No visible canal hematoma. Disc levels:  Degenerative changes at C5-C6. Upper chest: Negative. Other: None IMPRESSION: 1. No acute intracranial pathology.  2. No acute facial bone fractures. 3. No acute/traumatic cervical spine pathology. Electronically Signed   By: Anner Crete M.D.   On: 05/13/2018 23:40   Ct Knee Right Wo Contrast  Result Date: 05/14/2018 CLINICAL DATA:  67 year old female with fall and right knee pain. EXAM: CT OF THE right KNEE WITHOUT CONTRAST TECHNIQUE: Multidetector CT imaging of the right knee was performed according to the standard protocol. Multiplanar CT image reconstructions were also generated. COMPARISON:  Earlier radiograph dated  05/13/2018 FINDINGS: Bones/Joint/Cartilage There is a comminuted intra-articular fracture of the proximal tibia with mild depression of the lateral tibial plateau. The bones are osteopenic. There is no dislocation. There is a large suprapatellar lipohemarthrosis. Ligaments Suboptimally assessed by CT. Muscles and Tendons No acute findings. No intramuscular hematoma. Soft tissues There is diffuse subcutaneous stranding of the lateral knee. No fluid collection. IMPRESSION: 1. Comminuted intra-articular fracture of the proximal tibia with mild depression of the lateral tibial plateau. 2. Large suprapatellar lipohemarthrosis. Electronically Signed   By: Anner Crete M.D.   On: 05/14/2018 02:33   Ct Abdomen Pelvis W Contrast  Result Date: 05/14/2018 CLINICAL DATA:  67 year old female with trauma. EXAM: CT CHEST, ABDOMEN, AND PELVIS WITH CONTRAST TECHNIQUE: Multidetector CT imaging of the chest, abdomen and pelvis was performed following the standard protocol during bolus administration of intravenous contrast. CONTRAST:  179mL ISOVUE-300 IOPAMIDOL (ISOVUE-300) INJECTION 61% COMPARISON:  CT of the abdomen pelvis dated 12/02/2016 FINDINGS: CT CHEST FINDINGS Cardiovascular: There is mild cardiomegaly. No pericardial effusion. Multi vessel coronary vascular calcification. Mild atherosclerotic calcification of the thoracic aorta. No aneurysmal dilatation or dissection. The visualized origins of the great vessels of the aortic arch are patent. The central pulmonary arteries are grossly unremarkable. Mediastinum/Nodes: No hilar or mediastinal adenopathy. Esophagus and thyroid gland are grossly unremarkable. No mediastinal fluid collection. Lungs/Pleura: Bibasilar linear atelectasis/scarring. The lungs are clear. There is no pleural effusion or pneumothorax. The central airways are patent. Musculoskeletal: No chest wall mass or suspicious bone lesions identified. CT ABDOMEN PELVIS FINDINGS No intra-abdominal free air or  free fluid. Hepatobiliary: The liver is unremarkable. Cholecystectomy. Mild intrahepatic biliary ductal dilatation. Pancreas: Unremarkable. No pancreatic ductal dilatation or surrounding inflammatory changes. Spleen: Normal in size without focal abnormality. Adrenals/Urinary Tract: The adrenal glands are unremarkable. Small nonobstructing right renal inferior pole hypodense focus which is too small to characterize. There is no hydronephrosis on either side. There is symmetric enhancement and excretion of contrast by both kidneys. The visualized ureters and urinary bladder appear unremarkable. Stomach/Bowel: There is sigmoid diverticulosis without active inflammatory changes. A gastric lap band is noted. There is no bowel obstruction or active inflammation. The appendix is not visualized with certainty. No inflammatory changes identified in the right lower quadrant. Vascular/Lymphatic: Moderate aortoiliac atherosclerotic disease. No portal venous gas. There is no adenopathy. Reproductive: The uterus is anteverted and grossly unremarkable. Other: None Musculoskeletal: Grade 1 L4-L5 anterolisthesis. No acute osseous pathology. IMPRESSION: No acute intrathoracic, abdominal, or pelvic pathology. Electronically Signed   By: Anner Crete M.D.   On: 05/14/2018 02:42   Ct Ankle Right Wo Contrast  Result Date: 05/14/2018 CLINICAL DATA:  67 year old female with fall and right ankle pain. EXAM: CT OF THE RIGHT ANKLE WITHOUT CONTRAST TECHNIQUE: Multidetector CT imaging of the right ankle was performed according to the standard protocol. Multiplanar CT image reconstructions were also generated. COMPARISON:  Right ankle radiograph dated 05/13/2018 FINDINGS: Bones/Joint/Cartilage There is a comminuted fracture of the distal fibula with extension of the fracture into the  distal tibia-fibular syndesmosis. There is a minimally displaced fracture of the lateral aspect of the tibial plafond and nondisplaced fracture of the  posterior malleolus. There is a minimally displaced fracture of the medial malleolus. There bones are osteopenic. Discontinuity of the plantar cortex of the medial cuneiform (series 5, image 58 and sagittal series 6, image 89) concerning for an avulsion fracture. There is also linear lucency through the plantar base of the first metatarsal (series 6, image 90) also concerning for an avulsion corner fracture. Probable nondisplaced fracture of the dorsal aspect of the medial cuneiform (series 4, image 146 and sagittal series 6, image 69). Fracture of the base of the second metatarsal (series 6, image 73 and series 4, image 161). Nondisplaced linear lucency through the proximal fourth metatarsal (series 6, image 58) may be artifactual or represent a vascular groove or a nondisplaced fracture. No dislocation. Diffuse soft tissue edema. Ligaments Suboptimally assessed by CT. Muscles and Tendons No acute intramuscular findings. Soft tissues Diffuse subcutaneous edema. IMPRESSION: 1. Comminuted fracture of the distal fibula with extension of the fracture into the distal tibia-fibular syndesmosis. 2. Minimally displaced fracture of the lateral aspect of the tibial plafond and nondisplaced fracture of the posterior malleolus. 3. Minimally displaced fracture of the medial malleolus. 4. Nondisplaced fracture of the base of the second metatarsal. 5. Linear lucency through the plantar cortex of the medial cuneiform concerning for an avulsion fracture. 6. Linear lucency through the proximal fourth metatarsal may be artifactual or represent a nondisplaced fracture. Electronically Signed   By: Anner Crete M.D.   On: 05/14/2018 02:55   Dg Shoulder Left  Result Date: 05/14/2018 CLINICAL DATA:  General left shoulder pain.  Pain after fall. EXAM: LEFT SHOULDER - 2+ VIEW COMPARISON:  None. FINDINGS: There is no evidence of fracture or dislocation. There is no evidence of arthropathy or other focal bone abnormality. Soft tissues  are unremarkable. IMPRESSION: Negative. Electronically Signed   By: Dorise Bullion III M.D   On: 05/14/2018 00:07   Dg Hand Complete Right  Result Date: 05/14/2018 CLINICAL DATA:  67 year old female with fall and trauma to the right hand. EXAM: RIGHT HAND - COMPLETE 3+ VIEW COMPARISON:  Right wrist radiograph dated 05/13/2018 FINDINGS: Comminuted mildly displaced intra-articular fracture of the distal radius as seen on the earlier wrist radiograph. No other acute fracture noted. The bones are osteopenic. No dislocation. Soft tissue swelling of the wrist. IMPRESSION: Comminuted mildly displaced intra-articular fracture of the distal radius. Electronically Signed   By: Anner Crete M.D.   On: 05/14/2018 01:09   Ct Maxillofacial Wo Contrast  Result Date: 05/13/2018 CLINICAL DATA:  67 year old female with facial trauma. EXAM: CT HEAD WITHOUT CONTRAST CT MAXILLOFACIAL WITHOUT CONTRAST CT CERVICAL SPINE WITHOUT CONTRAST TECHNIQUE: Multidetector CT imaging of the head, cervical spine, and maxillofacial structures were performed using the standard protocol without intravenous contrast. Multiplanar CT image reconstructions of the cervical spine and maxillofacial structures were also generated. COMPARISON:  Head CT dated 07/23/2007 FINDINGS: CT HEAD FINDINGS Brain: The ventricles and sulci appropriate size for patient's age. The gray-white matter discrimination is preserved. There is no acute intracranial hemorrhage. No mass effect or midline shift. No extra-axial fluid collection. Vascular: No hyperdense vessel or unexpected calcification. Skull: Normal. Negative for fracture or focal lesion. Other: None. CT MAXILLOFACIAL FINDINGS Osseous: No fracture or mandibular dislocation. No destructive process. Orbits: Negative. No traumatic or inflammatory finding. Sinuses: Clear. Soft tissues: Negative. CT CERVICAL SPINE FINDINGS Alignment: No acute subluxation. Straightening of normal  cervical lordosis which may be  positional or due to muscle spasm. Skull base and vertebrae: No acute fracture. Soft tissues and spinal canal: No prevertebral fluid or swelling. No visible canal hematoma. Disc levels:  Degenerative changes at C5-C6. Upper chest: Negative. Other: None IMPRESSION: 1. No acute intracranial pathology. 2. No acute facial bone fractures. 3. No acute/traumatic cervical spine pathology. Electronically Signed   By: Anner Crete M.D.   On: 05/13/2018 23:40    Assessment/Plan  #1 history of lateral tibial plateau fracture on the right as well as right ankle fracture and right distal radius fracture-as noted above she does have bracing of the humerus fracture as well as the right lower leg with her ankle and tibial plateau fractures-she will need orthopedic follow-up.  She is nonweightbearing on the right lower extremity weightbearing as tolerated right upper extremity through the elbow.  For pain she does have Dilaudid 2 mg every 3 hours as needed in addition to PRN Tylenol and Neurontin 300 mg 3 times daily-ibuprofen at this point is been discontinued because of drug interactions but this will have to be watched.  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  2.  Rheumatoid arthritis as noted above she continues on prednisone she is followed by rheumatology she also has a recent diagnosis of lupus.  3.  History of acute on chronic anemia-thought to have an element of chronic disease with a rheumatoid arthritis as well as acute blood loss anemia hemoglobin appears to be rising slightly at 8.5 this will need monitoring-she is on iron.  4.  History of diastolic CHF continues on Lasix at this point continue to monitor she does have some edema of her right leg but I suspect this is more dependent status post surgery-continue to monitor her clinical status she does not complain of shortness of breath lung exam was benign today.  5.  Rheumatoid arthritis again continues on  prednisone orthopedics recommending holding her DMA RD and methotrexate until November 21.  6- history of coronary artery disease she is status post stenting this appears stable currently on aspirin Plavix metoprolol and ramipril continue to monitor.  --She is also on a statin  7.  History of osteoporosis with chronic steroid use-recommendation to possibly start Fosamax as an outpatient will await Dr. Steve Rattler input.  8.  History of gout she continues on allopurinol.  9.-  History of neuropathy again she is on Neurontin 300 mg 3 times daily she does say she has some chronic numbness this apparently has increased somewhat with the fractures at this point will monitor.  10.  History of hyperlipidemia continues on Zetia as well as fish oil--.  And atorvastatin  11.  Insomnia will start a trial course of melatonin as needed to see if this helps.  12.  Hypertension?  At this point will monitor I see systolics ranging from 027-253G today she continues on metoprolol and ramipril she is also on this with her history of coronary artery disease-at this point monitor would like to get more readings before making any changes  [id number]-of note greater than 40 minutes spent assessing patient- reviewing her chart and labs- discussing patient's status with her at bedside- and coordinating and formulating a plan of care for numerous diagnoses-of note greater than 50% of time spent coordinating plan of care with input as noted above   :

## 2018-05-22 NOTE — Telephone Encounter (Signed)
RX Fax for [facility]@ [phone]  

## 2018-05-22 NOTE — Plan of Care (Signed)
  Problem: Health Behavior/Discharge Planning: Goal: Ability to manage health-related needs will improve Outcome: Progressing   

## 2018-05-22 NOTE — Progress Notes (Signed)
Patient will DC to: [facility] Anticipated DC date: 05/22/18 Family notified: Herbie [facility] Transport by: Corey Harold  Per MD patient ready for DC to [facility]. RN, patient, patient's family, and facility notified of DC. Discharge Summary sent to facility. RN given number for report [phone] Room 131. DC packet on chart. Ambulance transport requested for patient.  CSW signing off.  [place]

## 2018-05-22 NOTE — Progress Notes (Signed)
Called report to [facility]. Informed receiving nurse of last medication administrations and script en route with pt. IV removed and pt tolerated well.

## 2018-05-23 ENCOUNTER — Inpatient Hospital Stay (HOSPITAL_COMMUNITY): Admission: RE | Admit: 2018-05-23 | Payer: Medicare Other | Source: Ambulatory Visit

## 2018-05-23 ENCOUNTER — Encounter (HOSPITAL_COMMUNITY): Payer: Medicare Other

## 2018-05-23 ENCOUNTER — Non-Acute Institutional Stay (SKILLED_NURSING_FACILITY): Payer: Medicare Other | Admitting: Internal Medicine

## 2018-05-23 ENCOUNTER — Encounter: Payer: Self-pay | Admitting: Internal Medicine

## 2018-05-23 DIAGNOSIS — M059 Rheumatoid arthritis with rheumatoid factor, unspecified: Secondary | ICD-10-CM | POA: Diagnosis not present

## 2018-05-23 DIAGNOSIS — I5032 Chronic diastolic (congestive) heart failure: Secondary | ICD-10-CM | POA: Diagnosis not present

## 2018-05-23 DIAGNOSIS — I2510 Atherosclerotic heart disease of native coronary artery without angina pectoris: Secondary | ICD-10-CM

## 2018-05-23 DIAGNOSIS — I10 Essential (primary) hypertension: Secondary | ICD-10-CM | POA: Diagnosis not present

## 2018-05-23 DIAGNOSIS — T07XXXA Unspecified multiple injuries, initial encounter: Secondary | ICD-10-CM | POA: Diagnosis not present

## 2018-05-23 DIAGNOSIS — E782 Mixed hyperlipidemia: Secondary | ICD-10-CM

## 2018-05-23 NOTE — Progress Notes (Signed)
Provider:  Veleta Miners MD Location:    [facility] Room Number: 131/P Place of Service:  SNF (31)  PCP: Redmond School, MD Patient Care Team: Redmond School, MD as PCP - General (Internal Medicine) Dorothy Spark, MD as PCP - Cardiology (Cardiology)  Extended Emergency Contact Information Primary Emergency Contact: [facility] Address: [address]          [place], [place] [address] [place] Phone: [phone] Mobile Phone: [phone] Relation: Spouse  Code Status: Full Code Goals of Care: Advanced Directive information Advanced Directives 05/23/2018  Does Patient Have a Medical Advance Directive? Yes  Type of Advance Directive (No Data)  Does patient want to make changes to medical advance directive? No - Patient declined  Copy of [place] in Chart? -  Would patient like information on creating a medical advance directive? No - Patient declined      Chief Complaint  Patient presents with  . New Admit To SNF    New Admission Visit    HPI: Patient is a 67 y.o. female seen today for admission to SNF for therapy after staying in the hospital from 11/02-11/11 after sustaining Multiple Fractures in a Fall.  Patient has a history of coronary artery disease status post PTCA, hypertension, moderate to severe rheumatoid arthritis on chronic prednisone, diastolic CHF, Candida esophagitis She fell in her home when she missed few steps in her house and fell down the Stairs She was found to have multiple Fracture Requiring ORIF.on 11/ 04 Her injuries needing surgeries were right lateral tibial plateau fracture, right trimalleolar ankle fracture, right distal radius fracture. Patient says her pain is controlled on Dilaudid as needed.  She was also taking Dilaudid at home.  She says the morphine and codeine makes her sick She lives with her husband and and uses as needed walker before. Did not have any acute  complaints today   Past Medical History:  Diagnosis Date  . CAD (coronary artery disease)    a. PTCA LAD 2001/cath 2002-prox and mid LAD stents patent, PTCA ostium Dx 1 b. cath 03/2016: patent LAD stent with less than 40% in-stent restenosis, 10-30% stenosis of D1 and distal LAD with continued medical management recommended.  . Candida esophagitis ([facility])   . Carotid artery disease ([facility])    Doppler September, 2011, 0-39% bilateral disease mild  . Chronic diastolic CHF (congestive heart failure) ([facility])   . Edema   . Emotional stress reaction    8/11  . HTN (hypertension)   . Hyperlipidemia   . Leg pain    July, 2012, Arterial Dopplers March 08, 2011 normal  . 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 PONV (postoperative nausea and vomiting)   . Rheumatoid arthritis ([facility])   . Right rotator cuff tear 11/16/2013  . Stroke [facility])    mini stroke in past ???   Past Surgical History:  Procedure Laterality Date  . CARDIAC CATHETERIZATION     with stent placement in 2001  . CARDIAC CATHETERIZATION N/A 03/12/2016   Procedure: Left Heart Cath and Coronary Angiography;  Surgeon: Nelva Bush, MD;  Location: [facility] CV LAB;  Service: Cardiovascular;  Laterality: N/A;  . CARDIAC CATHETERIZATION N/A 03/12/2016   Procedure: Intravascular Pressure Wire/FFR Study;  Surgeon: Nelva Bush, MD;  Location: [facility] LAB;  Service: Cardiovascular;  Laterality:  N/A;  . CHOLECYSTECTOMY    . CORONARY ANGIOPLASTY     2002  . ESOPHAGOGASTRODUODENOSCOPY N/A 12/03/2016   Procedure: ESOPHAGOGASTRODUODENOSCOPY (EGD);  Surgeon: Carol Ada, MD;  Location: [facility];  Service: Endoscopy;  Laterality: N/A;  . laproscopic adjustable gastric  banding     with APS standard system.   . OPEN REDUCTION INTERNAL FIXATION (ORIF) DISTAL RADIAL FRACTURE Right 05/15/2018   Procedure: OPEN REDUCTION  INTERNAL FIXATION (ORIF) DISTAL RADIAL FRACTURE;  Surgeon: Shona Needles, MD;  Location: [facility];  Service: Orthopedics;  Laterality: Right;  . ORIF ANKLE FRACTURE Right 05/15/2018   Procedure: OPEN REDUCTION INTERNAL FIXATION (ORIF) ANKLE FRACTURE;  Surgeon: Shona Needles, MD;  Location: [facility];  Service: Orthopedics;  Laterality: Right;  . ORIF TIBIA PLATEAU Right 05/15/2018   Procedure: OPEN REDUCTION INTERNAL FIXATION (ORIF) TIBIAL PLATEAU;  Surgeon: Shona Needles, MD;  Location: [facility];  Service: Orthopedics;  Laterality: Right;  . Pt has 3 stents     sept 2001  . SHOULDER ARTHROSCOPY WITH SUBACROMIAL DECOMPRESSION, ROTATOR CUFF REPAIR AND BICEP TENDON REPAIR Right 11/16/2013   Procedure: RIGHT SHOULDER ARTHROSCOPY, EXTENSIVE DEBRIDEMENT, ROTATOR CUFF REPAIR ;  Surgeon: [name], MD;  Location: [facility];  Service: Orthopedics;  Laterality: Right;  . TUBAL LIGATION      reports that she has never smoked. She has never used smokeless tobacco. She reports that she does not drink alcohol or use drugs. Social History   Socioeconomic History  . Marital status: Married    Spouse name: Not on file  . Number of children: Not on file  . Years of education: Not on file  . Highest education level: Not on file  Occupational History  . Not on file  Social Needs  . Financial resource strain: Not on file  . Food insecurity:    Worry: Not on file    Inability: Not on file  . Transportation needs:    Medical: Not on file    Non-medical: Not on file  Tobacco Use  . Smoking status: Never Smoker  . Smokeless tobacco: Never Used  Substance and Sexual Activity  . Alcohol use: No    Alcohol/week: 0.0 standard drinks  . Drug use: No  . Sexual activity: Never  Lifestyle  . Physical activity:    Days per week: Not on file    Minutes per session: Not on file  . Stress: Not on file  Relationships  . Social connections:    Talks on phone: Not on file    Gets together: Not  on file    Attends religious service: Not on file    Active member of club or organization: Not on file    Attends meetings of clubs or organizations: Not on file    Relationship status: Not on file  . Intimate partner violence:    Fear of current or ex partner: Not on file    Emotionally abused: Not on file    Physically abused: Not on file    Forced sexual activity: Not on file  Other Topics Concern  . Not on file  Social History Narrative  . Not on file    Functional Status Survey:    Family History  Problem Relation Age of Onset  . Heart attack Mother   . Heart disease Mother   . Diabetes Brother   . Heart disease Brother   . Diabetes Sister   . Arthritis/Rheumatoid Father   . Cirrhosis  Father        hepatic cirrhosis  . Heart disease Maternal Aunt   . Heart attack Maternal Aunt   . Throat cancer Maternal Aunt   . Heart attack Maternal Uncle   . Heart disease Brother   . Cancer Unknown        family history  . Heart attack Unknown        family history  . Colon cancer Maternal Grandmother   . Throat cancer Maternal Grandfather     Health Maintenance  Topic Date Due  . INFLUENZA VACCINE  06/21/2018 (Originally 02/09/2018)  . MAMMOGRAM  06/21/2018 (Originally 05/15/2011)  . DEXA SCAN  06/21/2018 (Originally 06/26/2016)  . COLONOSCOPY  06/21/2018 (Originally 06/26/2001)  . TETANUS/TDAP  06/21/2018 (Originally 06/26/1970)  . Hepatitis C Screening  06/21/2018 (Originally Jun 10, 1951)  . PNA vac Low Risk Adult (1 of 2 - PCV13) 06/21/2018 (Originally 06/26/2016)    Allergies  Allergen Reactions  . Codeine Phosphate Shortness Of Breath    REACTION: unspecified  . Amoxicillin Nausea And Vomiting    REACTION: unspecified  . Penicillins Nausea And Vomiting    Has patient had a PCN reaction causing immediate rash, facial/tongue/throat swelling, SOB or lightheadedness with hypotension:YES Has patient had a PCN reaction causing severe rash involving mucus membranes or  skin necrosis: NO Has patient had a PCN reaction that required hospitalization NO Has patient had a PCN reaction occurring within the last 10 years: NO If all of the above answers are "NO", then may proceed with Cephalosporin use.  Marland Kitchen Morphine And Related Nausea And Vomiting  . Lidocaine Rash    Patch caused rash    Allergies as of 05/23/2018      Reactions   Codeine Phosphate Shortness Of Breath   REACTION: unspecified   Amoxicillin Nausea And Vomiting   REACTION: unspecified   Penicillins Nausea And Vomiting   Has patient had a PCN reaction causing immediate rash, facial/tongue/throat swelling, SOB or lightheadedness with hypotension:YES Has patient had a PCN reaction causing severe rash involving mucus membranes or skin necrosis: NO Has patient had a PCN reaction that required hospitalization NO Has patient had a PCN reaction occurring within the last 10 years: NO If all of the above answers are "NO", then may proceed with Cephalosporin use.   Morphine And Related Nausea And Vomiting   Lidocaine Rash   Patch caused rash      Medication List        Accurate as of 05/23/18  9:47 AM. Always use your most recent med list.          acetaminophen 325 MG tablet Commonly known as:  TYLENOL Take 2 tablets (650 mg total) by mouth every 6 (six) hours.   allopurinol 100 MG tablet Commonly known as:  ZYLOPRIM Take 400 mg by mouth daily.   aspirin 81 MG tablet Take 81 mg by mouth daily.   atorvastatin 40 MG tablet Commonly known as:  LIPITOR Take 2 tablets (80 mg total) by mouth daily at 6 PM.   clopidogrel 75 MG tablet Commonly known as:  PLAVIX Take 1 tablet (75 mg total) by mouth daily.   ezetimibe 10 MG tablet Commonly known as:  ZETIA Take 1 tablet (10 mg total) by mouth daily.   ferrous sulfate 325 (65 FE) MG tablet Take 1 tablet (325 mg total) by mouth 3 (three) times daily with meals.   Fish Oil 1000 MG Caps Take 1 capsule by mouth daily.   folic acid  1 MG  tablet Commonly known as:  FOLVITE Take 1 mg by mouth daily. Takes with chemo medication.   furosemide 40 MG tablet Commonly known as:  LASIX TAKE 1 TABLET BY MOUTH  DAILY AND MAY TAKE AN  ADDITIONAL 1 TABLET DAILY  AS NEEDED FOR SWELLING.   gabapentin 300 MG capsule Commonly known as:  NEURONTIN Take 1 capsule (300 mg total) by mouth 3 (three) times daily.   HYDROmorphone 2 MG tablet Commonly known as:  DILAUDID Take 1 tablet (2 mg total) by mouth every 3 (three) hours as needed for severe pain.   levocetirizine 5 MG tablet Commonly known as:  XYZAL Take 1 tablet by mouth daily.   Melatonin 3 MG Tabs Take 3 mg by mouth at bedtime as needed.   metoprolol tartrate 50 MG tablet Commonly known as:  LOPRESSOR TAKE 1 TABLET BY MOUTH ONCE DAILY   nitroGLYCERIN 0.4 MG SL tablet Commonly known as:  NITROSTAT DISSOLVE 1 TABLET UNDER THE TONGUE EVERY 5 MINUTES AS  NEEDED (NOT TO EXCEED 3  TABLETS IN 15 MINS, CALL  911 IF CHEST PAIN PERSISTS   pantoprazole 40 MG tablet Commonly known as:  PROTONIX Take 40 mg by mouth daily.   predniSONE 20 MG tablet Commonly known as:  DELTASONE Take 1 tablet (20 mg total) by mouth daily with breakfast.   ramipril 2.5 MG capsule Commonly known as:  ALTACE Take 1 capsule (2.5 mg total) by mouth daily.   senna-docusate 8.6-50 MG tablet Commonly known as:  Senokot-S Take 1 tablet by mouth daily as needed for mild constipation.       Review of Systems  Review of Systems  Constitutional: Negative for activity change, appetite change, chills, diaphoresis, fatigue and fever.  HENT: Negative for mouth sores, postnasal drip, rhinorrhea, sinus pain and sore throat.   Respiratory: Negative for apnea, cough, chest tightness, shortness of breath and wheezing.   Cardiovascular: Negative for chest pain, palpitations and leg swelling.  Gastrointestinal: Negative for abdominal distention, abdominal pain, constipation, diarrhea, nausea and vomiting.    Genitourinary: Negative for dysuria and frequency.  Musculoskeletal: Negative for arthralgias, joint swelling and myalgias.  Skin: Negative for rash.  Neurological: Negative for dizziness, syncope, weakness, light-headedness and numbness.  Psychiatric/Behavioral: Negative for behavioral problems, confusion and sleep disturbance.     Vitals:   05/23/18 0946  BP: (!) 146/64  Pulse: 71  Resp: 20  Temp: 98.6 F (37 C)  TempSrc: Oral   There is no height or weight on file to calculate BMI. Physical Exam  Constitutional: Oriented to person, place, and time. Well-developed and well-nourished.  HENT:  Head: Normocephalic.  Mouth/Throat: Oropharynx is clear and moist.  Eyes: Pupils are equal, round, and reactive to light.  Neck: Neck supple.  Cardiovascular: Normal rate and normal heart sounds.  No murmur heard. Pulmonary/Chest: Effort normal and breath sounds normal. No respiratory distress. No wheezes. She has no rales.  Abdominal: Soft. Bowel sounds are normal. No distension. There is no tenderness. There is no rebound.  Musculoskeletal: Mild edema Bilateral Has brace in Both RUE and R LE Lymphadenopathy: none Neurological: Alert and oriented to person, place, and time.No Focal Deficits  Skin: Skin is warm and dry.  Psychiatric: Normal mood and affect. Behavior is normal. Thought content normal.    Labs reviewed: Basic Metabolic Panel: Recent Labs    05/15/18 0443  05/20/18 0307 05/21/18 0244 05/22/18 0224  NA 134*   < > 134* 135 135  K 4.5   < >  3.7 4.3 3.7  CL 103   < > 98 103 102  CO2 24   < > 28 25 28   GLUCOSE 115*   < > 104* 95 86  BUN 12   < > 14 14 9   CREATININE 0.54   < > 0.59 0.57 0.67  CALCIUM 8.8*   < > 8.1* 8.2* 8.3*  MG 2.1  --   --   --   --    < > = values in this interval not displayed.   Liver Function Tests: Recent Labs    05/20/18 0307 05/21/18 0244 05/22/18 0224  AST 17 20 23   ALT 38 31 45*  ALKPHOS 59 55 69  BILITOT 0.8 1.6* 0.8  PROT  5.0* 5.0* 5.1*  ALBUMIN 2.2* 2.3* 2.4*   No results for input(s): LIPASE, AMYLASE in the last 8760 hours. No results for input(s): AMMONIA in the last 8760 hours. CBC: Recent Labs    05/20/18 0307 05/21/18 0244 05/22/18 0224  WBC 8.2 8.8 7.9  NEUTROABS 5.9 6.3 5.0  HGB 7.9* 8.3* 8.5*  HCT 25.2* 26.8* 27.4*  MCV 93.3 93.4 94.5  PLT 308 368 350   Cardiac Enzymes: No results for input(s): CKTOTAL, CKMB, CKMBINDEX, TROPONINI in the last 8760 hours. BNP: Invalid input(s): POCBNP Lab Results  Component Value Date   HGBA1C 5.5 03/11/2016   Lab Results  Component Value Date   TSH 2.398 03/11/2016   No results found for: VITAMINB12 No results found for: FOLATE No results found for: IRON, TIBC, FERRITIN  Imaging and Procedures obtained prior to SNF admission: Dg Chest 2 View  Result Date: 05/14/2018 CLINICAL DATA:  67 year old female with fall. History of CHF. EXAM: CHEST - 2 VIEW COMPARISON:  Chest CT dated 05/14/2018 FINDINGS: There is cardiomegaly with probable mild vascular congestion. No focal consolidation, pleural effusion, or pneumothorax. No acute osseous pathology. IMPRESSION: Cardiomegaly with probable mild vascular congestion. No focal consolidation. Electronically Signed   By: Anner Crete M.D.   On: 05/14/2018 01:10   Dg Pelvis 1-2 Views  Result Date: 05/14/2018 CLINICAL DATA:  Fall EXAM: PELVIS - 1-2 VIEW COMPARISON:  None. FINDINGS: There is no evidence of pelvic fracture or diastasis. No pelvic bone lesions are seen. IMPRESSION: Negative. Electronically Signed   By: Kerby Moors M.D.   On: 05/14/2018 01:10   Dg Forearm Right  Result Date: 05/14/2018 CLINICAL DATA:  Fall.  Right wrist pain. EXAM: RIGHT FOREARM - 2 VIEW COMPARISON:  None. FINDINGS: There is a an acute intra-articular fracture deformity involving the distal radius. Dorsal angulation of the distal fracture fragments noted. No dislocation identified. IMPRESSION: 1. Acute fracture involves the  dorsal aspect of the distal radius. Mild dorsal angulation of the distal fracture fragments. Electronically Signed   By: Kerby Moors M.D.   On: 05/14/2018 01:07   Dg Wrist Complete Left  Result Date: 05/14/2018 CLINICAL DATA:  67 year old female with fall and left wrist pain. EXAM: LEFT WRIST - COMPLETE 3+ VIEW COMPARISON:  None. FINDINGS: There is no acute fracture or dislocation. The bones are osteopenic. No significant arthritic changes. There is soft tissue swelling over the wrist and ulna. No radiopaque foreign object or soft tissue gas. IMPRESSION: No acute fracture or dislocation. Electronically Signed   By: Anner Crete M.D.   On: 05/14/2018 00:13   Dg Wrist Complete Right  Result Date: 05/14/2018 CLINICAL DATA:  Pain after fall EXAM: RIGHT WRIST - COMPLETE 3+ VIEW COMPARISON:  None. FINDINGS: There is  a comminuted mildly displaced fracture through the distal radial metaphysis. IMPRESSION: Comminuted mildly displaced fracture through the distal radial metaphysis. Electronically Signed   By: Dorise Bullion III M.D   On: 05/14/2018 00:12   Dg Knee 2 Views Right  Result Date: 05/14/2018 CLINICAL DATA:  Pain after fall EXAM: RIGHT KNEE - 1-2 VIEW COMPARISON:  None. FINDINGS: A hemarthrosis is identified. There is a depressed lateral tibial plateau fracture which is comminuted. IMPRESSION: Comminuted mildly depressed lateral tibial plateau fracture with resulting hemarthrosis. Electronically Signed   By: Dorise Bullion III M.D   On: 05/14/2018 00:08   Dg Tibia/fibula Right  Result Date: 05/14/2018 CLINICAL DATA:  Pain after fall EXAM: RIGHT TIBIA AND FIBULA - 2 VIEW COMPARISON:  None. FINDINGS: There is a comminuted depressed fracture through the lateral tibial plateau. There is a comminuted fracture through the distal fibular diaphysis. There is a lucency through the distal medial malleolus with a step-off. These hemarthrosis in the suprapatellar fossa. IMPRESSION: 1. Depressed  comminuted lateral tibial plateau fracture. 2. Comminuted fracture through the distal fibular diaphysis. 3. Fracture through the distal medial malleolus. Electronically Signed   By: Dorise Bullion III M.D   On: 05/14/2018 00:14   Dg Ankle Complete Right  Result Date: 05/14/2018 CLINICAL DATA:  Pain after fall EXAM: RIGHT ANKLE - COMPLETE 3+ VIEW COMPARISON:  None. FINDINGS: There is a comminuted fracture through the distal fibular diaphysis. There is a fracture through the medial malleolus. There also be appears to be a fracture through the distal tibia where it abuts the fibula. Irregularity of the distal most fibula is consistent with an age indeterminate fracture but may not be acute. Soft tissue swelling. IMPRESSION: 1. Fracture through the medial malleolus. 2. Apparent fracture through the lateral aspect of the distal tibia with displacement. 3. Fracture through the fibular diaphysis distally. 4. Irregularity of the distal tip of the fibula may be nonacute from previous injury. 5. No definitive posterior malleolar fracture. However, given the complex nature of the ankle fracture, consider a CT scan for better evaluation. Electronically Signed   By: Dorise Bullion III M.D   On: 05/14/2018 00:17   Ct Head Wo Contrast  Result Date: 05/13/2018 CLINICAL DATA:  67 year old female with facial trauma. EXAM: CT HEAD WITHOUT CONTRAST CT MAXILLOFACIAL WITHOUT CONTRAST CT CERVICAL SPINE WITHOUT CONTRAST TECHNIQUE: Multidetector CT imaging of the head, cervical spine, and maxillofacial structures were performed using the standard protocol without intravenous contrast. Multiplanar CT image reconstructions of the cervical spine and maxillofacial structures were also generated. COMPARISON:  Head CT dated 07/23/2007 FINDINGS: CT HEAD FINDINGS Brain: The ventricles and sulci appropriate size for patient's age. The gray-white matter discrimination is preserved. There is no acute intracranial hemorrhage. No mass effect  or midline shift. No extra-axial fluid collection. Vascular: No hyperdense vessel or unexpected calcification. Skull: Normal. Negative for fracture or focal lesion. Other: None. CT MAXILLOFACIAL FINDINGS Osseous: No fracture or mandibular dislocation. No destructive process. Orbits: Negative. No traumatic or inflammatory finding. Sinuses: Clear. Soft tissues: Negative. CT CERVICAL SPINE FINDINGS Alignment: No acute subluxation. Straightening of normal cervical lordosis which may be positional or due to muscle spasm. Skull base and vertebrae: No acute fracture. Soft tissues and spinal canal: No prevertebral fluid or swelling. No visible canal hematoma. Disc levels:  Degenerative changes at C5-C6. Upper chest: Negative. Other: None IMPRESSION: 1. No acute intracranial pathology. 2. No acute facial bone fractures. 3. No acute/traumatic cervical spine pathology. Electronically Signed   By: Milas Hock  Radparvar M.D.   On: 05/13/2018 23:40   Ct Chest W Contrast  Result Date: 05/14/2018 CLINICAL DATA:  67 year old female with trauma. EXAM: CT CHEST, ABDOMEN, AND PELVIS WITH CONTRAST TECHNIQUE: Multidetector CT imaging of the chest, abdomen and pelvis was performed following the standard protocol during bolus administration of intravenous contrast. CONTRAST:  158mL ISOVUE-300 IOPAMIDOL (ISOVUE-300) INJECTION 61% COMPARISON:  CT of the abdomen pelvis dated 12/02/2016 FINDINGS: CT CHEST FINDINGS Cardiovascular: There is mild cardiomegaly. No pericardial effusion. Multi vessel coronary vascular calcification. Mild atherosclerotic calcification of the thoracic aorta. No aneurysmal dilatation or dissection. The visualized origins of the great vessels of the aortic arch are patent. The central pulmonary arteries are grossly unremarkable. Mediastinum/Nodes: No hilar or mediastinal adenopathy. Esophagus and thyroid gland are grossly unremarkable. No mediastinal fluid collection. Lungs/Pleura: Bibasilar linear atelectasis/scarring.  The lungs are clear. There is no pleural effusion or pneumothorax. The central airways are patent. Musculoskeletal: No chest wall mass or suspicious bone lesions identified. CT ABDOMEN PELVIS FINDINGS No intra-abdominal free air or free fluid. Hepatobiliary: The liver is unremarkable. Cholecystectomy. Mild intrahepatic biliary ductal dilatation. Pancreas: Unremarkable. No pancreatic ductal dilatation or surrounding inflammatory changes. Spleen: Normal in size without focal abnormality. Adrenals/Urinary Tract: The adrenal glands are unremarkable. Small nonobstructing right renal inferior pole hypodense focus which is too small to characterize. There is no hydronephrosis on either side. There is symmetric enhancement and excretion of contrast by both kidneys. The visualized ureters and urinary bladder appear unremarkable. Stomach/Bowel: There is sigmoid diverticulosis without active inflammatory changes. A gastric lap band is noted. There is no bowel obstruction or active inflammation. The appendix is not visualized with certainty. No inflammatory changes identified in the right lower quadrant. Vascular/Lymphatic: Moderate aortoiliac atherosclerotic disease. No portal venous gas. There is no adenopathy. Reproductive: The uterus is anteverted and grossly unremarkable. Other: None Musculoskeletal: Grade 1 L4-L5 anterolisthesis. No acute osseous pathology. IMPRESSION: No acute intrathoracic, abdominal, or pelvic pathology. Electronically Signed   By: Anner Crete M.D.   On: 05/14/2018 02:42   Ct Cervical Spine Wo Contrast  Result Date: 05/13/2018 CLINICAL DATA:  67 year old female with facial trauma. EXAM: CT HEAD WITHOUT CONTRAST CT MAXILLOFACIAL WITHOUT CONTRAST CT CERVICAL SPINE WITHOUT CONTRAST TECHNIQUE: Multidetector CT imaging of the head, cervical spine, and maxillofacial structures were performed using the standard protocol without intravenous contrast. Multiplanar CT image reconstructions of the  cervical spine and maxillofacial structures were also generated. COMPARISON:  Head CT dated 07/23/2007 FINDINGS: CT HEAD FINDINGS Brain: The ventricles and sulci appropriate size for patient's age. The gray-white matter discrimination is preserved. There is no acute intracranial hemorrhage. No mass effect or midline shift. No extra-axial fluid collection. Vascular: No hyperdense vessel or unexpected calcification. Skull: Normal. Negative for fracture or focal lesion. Other: None. CT MAXILLOFACIAL FINDINGS Osseous: No fracture or mandibular dislocation. No destructive process. Orbits: Negative. No traumatic or inflammatory finding. Sinuses: Clear. Soft tissues: Negative. CT CERVICAL SPINE FINDINGS Alignment: No acute subluxation. Straightening of normal cervical lordosis which may be positional or due to muscle spasm. Skull base and vertebrae: No acute fracture. Soft tissues and spinal canal: No prevertebral fluid or swelling. No visible canal hematoma. Disc levels:  Degenerative changes at C5-C6. Upper chest: Negative. Other: None IMPRESSION: 1. No acute intracranial pathology. 2. No acute facial bone fractures. 3. No acute/traumatic cervical spine pathology. Electronically Signed   By: Anner Crete M.D.   On: 05/13/2018 23:40   Ct Knee Right Wo Contrast  Result Date: 05/14/2018 CLINICAL DATA:  67 year old female with fall and right knee pain. EXAM: CT OF THE right KNEE WITHOUT CONTRAST TECHNIQUE: Multidetector CT imaging of the right knee was performed according to the standard protocol. Multiplanar CT image reconstructions were also generated. COMPARISON:  Earlier radiograph dated 05/13/2018 FINDINGS: Bones/Joint/Cartilage There is a comminuted intra-articular fracture of the proximal tibia with mild depression of the lateral tibial plateau. The bones are osteopenic. There is no dislocation. There is a large suprapatellar lipohemarthrosis. Ligaments Suboptimally assessed by CT. Muscles and Tendons No acute  findings. No intramuscular hematoma. Soft tissues There is diffuse subcutaneous stranding of the lateral knee. No fluid collection. IMPRESSION: 1. Comminuted intra-articular fracture of the proximal tibia with mild depression of the lateral tibial plateau. 2. Large suprapatellar lipohemarthrosis. Electronically Signed   By: Anner Crete M.D.   On: 05/14/2018 02:33   Ct Abdomen Pelvis W Contrast  Result Date: 05/14/2018 CLINICAL DATA:  67 year old female with trauma. EXAM: CT CHEST, ABDOMEN, AND PELVIS WITH CONTRAST TECHNIQUE: Multidetector CT imaging of the chest, abdomen and pelvis was performed following the standard protocol during bolus administration of intravenous contrast. CONTRAST:  168mL ISOVUE-300 IOPAMIDOL (ISOVUE-300) INJECTION 61% COMPARISON:  CT of the abdomen pelvis dated 12/02/2016 FINDINGS: CT CHEST FINDINGS Cardiovascular: There is mild cardiomegaly. No pericardial effusion. Multi vessel coronary vascular calcification. Mild atherosclerotic calcification of the thoracic aorta. No aneurysmal dilatation or dissection. The visualized origins of the great vessels of the aortic arch are patent. The central pulmonary arteries are grossly unremarkable. Mediastinum/Nodes: No hilar or mediastinal adenopathy. Esophagus and thyroid gland are grossly unremarkable. No mediastinal fluid collection. Lungs/Pleura: Bibasilar linear atelectasis/scarring. The lungs are clear. There is no pleural effusion or pneumothorax. The central airways are patent. Musculoskeletal: No chest wall mass or suspicious bone lesions identified. CT ABDOMEN PELVIS FINDINGS No intra-abdominal free air or free fluid. Hepatobiliary: The liver is unremarkable. Cholecystectomy. Mild intrahepatic biliary ductal dilatation. Pancreas: Unremarkable. No pancreatic ductal dilatation or surrounding inflammatory changes. Spleen: Normal in size without focal abnormality. Adrenals/Urinary Tract: The adrenal glands are unremarkable. Small  nonobstructing right renal inferior pole hypodense focus which is too small to characterize. There is no hydronephrosis on either side. There is symmetric enhancement and excretion of contrast by both kidneys. The visualized ureters and urinary bladder appear unremarkable. Stomach/Bowel: There is sigmoid diverticulosis without active inflammatory changes. A gastric lap band is noted. There is no bowel obstruction or active inflammation. The appendix is not visualized with certainty. No inflammatory changes identified in the right lower quadrant. Vascular/Lymphatic: Moderate aortoiliac atherosclerotic disease. No portal venous gas. There is no adenopathy. Reproductive: The uterus is anteverted and grossly unremarkable. Other: None Musculoskeletal: Grade 1 L4-L5 anterolisthesis. No acute osseous pathology. IMPRESSION: No acute intrathoracic, abdominal, or pelvic pathology. Electronically Signed   By: Anner Crete M.D.   On: 05/14/2018 02:42   Ct Ankle Right Wo Contrast  Result Date: 05/14/2018 CLINICAL DATA:  67 year old female with fall and right ankle pain. EXAM: CT OF THE RIGHT ANKLE WITHOUT CONTRAST TECHNIQUE: Multidetector CT imaging of the right ankle was performed according to the standard protocol. Multiplanar CT image reconstructions were also generated. COMPARISON:  Right ankle radiograph dated 05/13/2018 FINDINGS: Bones/Joint/Cartilage There is a comminuted fracture of the distal fibula with extension of the fracture into the distal tibia-fibular syndesmosis. There is a minimally displaced fracture of the lateral aspect of the tibial plafond and nondisplaced fracture of the posterior malleolus. There is a minimally displaced fracture of the medial malleolus. There bones are osteopenic. Discontinuity of  the plantar cortex of the medial cuneiform (series 5, image 58 and sagittal series 6, image 89) concerning for an avulsion fracture. There is also linear lucency through the plantar base of the first  metatarsal (series 6, image 90) also concerning for an avulsion corner fracture. Probable nondisplaced fracture of the dorsal aspect of the medial cuneiform (series 4, image 146 and sagittal series 6, image 69). Fracture of the base of the second metatarsal (series 6, image 73 and series 4, image 161). Nondisplaced linear lucency through the proximal fourth metatarsal (series 6, image 58) may be artifactual or represent a vascular groove or a nondisplaced fracture. No dislocation. Diffuse soft tissue edema. Ligaments Suboptimally assessed by CT. Muscles and Tendons No acute intramuscular findings. Soft tissues Diffuse subcutaneous edema. IMPRESSION: 1. Comminuted fracture of the distal fibula with extension of the fracture into the distal tibia-fibular syndesmosis. 2. Minimally displaced fracture of the lateral aspect of the tibial plafond and nondisplaced fracture of the posterior malleolus. 3. Minimally displaced fracture of the medial malleolus. 4. Nondisplaced fracture of the base of the second metatarsal. 5. Linear lucency through the plantar cortex of the medial cuneiform concerning for an avulsion fracture. 6. Linear lucency through the proximal fourth metatarsal may be artifactual or represent a nondisplaced fracture. Electronically Signed   By: Anner Crete M.D.   On: 05/14/2018 02:55   Dg Shoulder Left  Result Date: 05/14/2018 CLINICAL DATA:  General left shoulder pain.  Pain after fall. EXAM: LEFT SHOULDER - 2+ VIEW COMPARISON:  None. FINDINGS: There is no evidence of fracture or dislocation. There is no evidence of arthropathy or other focal bone abnormality. Soft tissues are unremarkable. IMPRESSION: Negative. Electronically Signed   By: Dorise Bullion III M.D   On: 05/14/2018 00:07   Dg Hand Complete Right  Result Date: 05/14/2018 CLINICAL DATA:  67 year old female with fall and trauma to the right hand. EXAM: RIGHT HAND - COMPLETE 3+ VIEW COMPARISON:  Right wrist radiograph dated  05/13/2018 FINDINGS: Comminuted mildly displaced intra-articular fracture of the distal radius as seen on the earlier wrist radiograph. No other acute fracture noted. The bones are osteopenic. No dislocation. Soft tissue swelling of the wrist. IMPRESSION: Comminuted mildly displaced intra-articular fracture of the distal radius. Electronically Signed   By: Anner Crete M.D.   On: 05/14/2018 01:09   Ct Maxillofacial Wo Contrast  Result Date: 05/13/2018 CLINICAL DATA:  67 year old female with facial trauma. EXAM: CT HEAD WITHOUT CONTRAST CT MAXILLOFACIAL WITHOUT CONTRAST CT CERVICAL SPINE WITHOUT CONTRAST TECHNIQUE: Multidetector CT imaging of the head, cervical spine, and maxillofacial structures were performed using the standard protocol without intravenous contrast. Multiplanar CT image reconstructions of the cervical spine and maxillofacial structures were also generated. COMPARISON:  Head CT dated 07/23/2007 FINDINGS: CT HEAD FINDINGS Brain: The ventricles and sulci appropriate size for patient's age. The gray-white matter discrimination is preserved. There is no acute intracranial hemorrhage. No mass effect or midline shift. No extra-axial fluid collection. Vascular: No hyperdense vessel or unexpected calcification. Skull: Normal. Negative for fracture or focal lesion. Other: None. CT MAXILLOFACIAL FINDINGS Osseous: No fracture or mandibular dislocation. No destructive process. Orbits: Negative. No traumatic or inflammatory finding. Sinuses: Clear. Soft tissues: Negative. CT CERVICAL SPINE FINDINGS Alignment: No acute subluxation. Straightening of normal cervical lordosis which may be positional or due to muscle spasm. Skull base and vertebrae: No acute fracture. Soft tissues and spinal canal: No prevertebral fluid or swelling. No visible canal hematoma. Disc levels:  Degenerative changes at C5-C6. Upper  chest: Negative. Other: None IMPRESSION: 1. No acute intracranial pathology. 2. No acute facial bone  fractures. 3. No acute/traumatic cervical spine pathology. Electronically Signed   By: Anner Crete M.D.   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

## 2018-05-28 DIAGNOSIS — S82851A Displaced trimalleolar fracture of right lower leg, initial encounter for closed fracture: Secondary | ICD-10-CM

## 2018-05-29 ENCOUNTER — Non-Acute Institutional Stay (SKILLED_NURSING_FACILITY): Payer: Medicare Other | Admitting: Internal Medicine

## 2018-05-29 ENCOUNTER — Telehealth: Payer: Self-pay

## 2018-05-29 ENCOUNTER — Encounter: Payer: Self-pay | Admitting: Internal Medicine

## 2018-05-29 DIAGNOSIS — S82141A Displaced bicondylar fracture of right tibia, initial encounter for closed fracture: Secondary | ICD-10-CM | POA: Diagnosis not present

## 2018-05-29 DIAGNOSIS — S52501A Unspecified fracture of the lower end of right radius, initial encounter for closed fracture: Secondary | ICD-10-CM

## 2018-05-29 DIAGNOSIS — I10 Essential (primary) hypertension: Secondary | ICD-10-CM

## 2018-05-29 DIAGNOSIS — I2510 Atherosclerotic heart disease of native coronary artery without angina pectoris: Secondary | ICD-10-CM | POA: Diagnosis not present

## 2018-05-29 DIAGNOSIS — S82851D Displaced trimalleolar fracture of right lower leg, subsequent encounter for closed fracture with routine healing: Secondary | ICD-10-CM

## 2018-05-29 DIAGNOSIS — M059 Rheumatoid arthritis with rheumatoid factor, unspecified: Secondary | ICD-10-CM

## 2018-05-29 DIAGNOSIS — I5032 Chronic diastolic (congestive) heart failure: Secondary | ICD-10-CM | POA: Diagnosis not present

## 2018-05-29 NOTE — Telephone Encounter (Addendum)
New message   Call the patient to set up MYOCARDIAL PERFUSION patient inform her in the hospital at [facility] due to a recent fall.  The patient advises she will call back to schedule an appointment

## 2018-05-29 NOTE — Progress Notes (Signed)
Location:    [facility] Room Number: 131/P Place of Service:  SNF (31)  Provider: Veleta Miners MD  PCP: Redmond School, MD Patient Care Team: Redmond School, MD as PCP - General (Internal Medicine) Dorothy Spark, MD as PCP - Cardiology (Cardiology)  Extended Emergency Contact Information Primary Emergency Contact: [facility] Address: [address]          [place], [place] [address] Johnnette Litter of [place] Phone: [phone] Mobile Phone: [phone] Relation: Spouse  Code Status: Full Code Goals of care:  Advanced Directive information Advanced Directives 05/29/2018  Does Patient Have a Medical Advance Directive? Yes  Type of Advance Directive (No Data)  Does patient want to make changes to medical advance directive? No - Patient declined  Copy of [place] in Chart? -  Would patient like information on creating a medical advance directive? No - Patient declined     Allergies  Allergen Reactions  . Codeine Phosphate Shortness Of Breath    REACTION: unspecified  . Amoxicillin Nausea And Vomiting    REACTION: unspecified  . Penicillins Nausea And Vomiting    Has patient had a PCN reaction causing immediate rash, facial/tongue/throat swelling, SOB or lightheadedness with hypotension:YES Has patient had a PCN reaction causing severe rash involving mucus membranes or skin necrosis: NO Has patient had a PCN reaction that required hospitalization NO Has patient had a PCN reaction occurring within the last 10 years: NO If all of the above answers are "NO", then may proceed with Cephalosporin use.  Marland Kitchen Morphine And Related Nausea And Vomiting  . Lidocaine Rash    Patch caused rash    Chief Complaint  Patient presents with  . Discharge Note    Discharge Visit    HPI:  67 y.o. female seen today for discharge from facility scheduled for tomorrow  Patient was here for rehab after sustaining multiple injuries  after a fall at home- she also has a history of coronary artery disease status post PTCA as well as hypertension-rheumatoid arthritis on chronic prednisone as well as diastolic CHF and esophagitis.  She fell at home after missing some steps and had multiple fractures these injuries needed surgery where the right lateral tibial plateau fracture as well as right trimalleolar ankle fracture and a right distal radius fracture.  She is receiving Dilaudid for pain she says she is trying to minimize its use.  Morphine and codeine apparently makes her ill.  She has done well with therapy and will be going home she has a very supportive husband and apparently she does have a ramp at home.  She continues with a splint to the right upper extremity and has stitches she would like these removed in the near future-she is nonweightbearing on the right lower extremity right upper extremities weight bearing as tolerated.  She will need a rolling walker as well as bedside commode and wheelchair to help with ambulation as well as continued therapy and nursing support.  Today her only complaint is some left third toe pain she says she stubbed her toe apparently late last week and has had some pain which at times makes it difficult to walk on that foot--she feels is gotten a bit better over the weekend.  In regards to rheumatoid arthritis she continues on prednisone her DMA RD's are on hold until evaluated by orthopedics.  She also has a history of diastolic CHF her weight is been relatively stable around 210-215 pounds during  her stay here edema is at baseline she does not complain of shortness of breath or cough.  She also has a history anemia she has been started on iron hemoglobin appears to be slowly trending up update lab is pending for tomorrow.  Currently she is visiting with her husband in the room appears to be doing well vital signs appear to be stable she is looking forward to going  home.        Past Medical History:  Diagnosis Date  . CAD (coronary artery disease)    a. PTCA LAD 2001/cath 2002-prox and mid LAD stents patent, PTCA ostium Dx 1 b. cath 03/2016: patent LAD stent with less than 40% in-stent restenosis, 10-30% stenosis of D1 and distal LAD with continued medical management recommended.  . Candida esophagitis ([facility])   . Carotid artery disease ([facility])    Doppler September, 2011, 0-39% bilateral disease mild  . Chronic diastolic CHF (congestive heart failure) ([facility])   . Edema   . Emotional stress reaction    8/11  . HTN (hypertension)   . Hyperlipidemia   . Leg pain    July, 2012, Arterial Dopplers March 08, 2011 normal  . 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 PONV (postoperative nausea and vomiting)   . Rheumatoid arthritis ([facility])   . Right rotator cuff tear 11/16/2013  . Stroke [facility])    mini stroke in past ???    Past Surgical History:  Procedure Laterality Date  . CARDIAC CATHETERIZATION     with stent placement in 2001  . CARDIAC CATHETERIZATION N/A 03/12/2016   Procedure: Left Heart Cath and Coronary Angiography;  Surgeon: Nelva Bush, MD;  Location: [facility] CV LAB;  Service: Cardiovascular;  Laterality: N/A;  . CARDIAC CATHETERIZATION N/A 03/12/2016   Procedure: Intravascular Pressure Wire/FFR Study;  Surgeon: Nelva Bush, MD;  Location: [facility] CV LAB;  Service: Cardiovascular;  Laterality: N/A;  . CHOLECYSTECTOMY    . CORONARY ANGIOPLASTY     2002  . ESOPHAGOGASTRODUODENOSCOPY N/A 12/03/2016   Procedure: ESOPHAGOGASTRODUODENOSCOPY (EGD);  Surgeon: Carol Ada, MD;  Location: [facility];  Service: Endoscopy;  Laterality: N/A;  . laproscopic adjustable gastric  banding     with APS standard system.   . OPEN REDUCTION INTERNAL FIXATION (ORIF) DISTAL RADIAL FRACTURE Right 05/15/2018   Procedure: OPEN REDUCTION  INTERNAL FIXATION (ORIF) DISTAL RADIAL FRACTURE;  Surgeon: Shona Needles, MD;  Location: [facility];  Service: Orthopedics;  Laterality: Right;  . ORIF ANKLE FRACTURE Right 05/15/2018   Procedure: OPEN REDUCTION INTERNAL FIXATION (ORIF) ANKLE FRACTURE;  Surgeon: Shona Needles, MD;  Location: [facility];  Service: Orthopedics;  Laterality: Right;  . ORIF TIBIA PLATEAU Right 05/15/2018   Procedure: OPEN REDUCTION INTERNAL FIXATION (ORIF) TIBIAL PLATEAU;  Surgeon: Shona Needles, MD;  Location: [facility];  Service: Orthopedics;  Laterality: Right;  . Pt has 3 stents     sept 2001  . SHOULDER ARTHROSCOPY WITH SUBACROMIAL DECOMPRESSION, ROTATOR CUFF REPAIR AND BICEP TENDON REPAIR Right 11/16/2013   Procedure: RIGHT SHOULDER ARTHROSCOPY, EXTENSIVE DEBRIDEMENT, ROTATOR CUFF REPAIR ;  Surgeon: [name], MD;  Location: [facility];  Service: Orthopedics;  Laterality: Right;  . TUBAL LIGATION        reports that she has never smoked. She has never used smokeless tobacco. She reports that she does not drink alcohol or use drugs.  Social History   Socioeconomic History  . Marital status: Married    Spouse name: Not on file  . Number of children: Not on file  . Years of education: Not on file  . Highest education level: Not on file  Occupational History  . Not on file  Social Needs  . Financial resource strain: Not on file  . Food insecurity:    Worry: Not on file    Inability: Not on file  . Transportation needs:    Medical: Not on file    Non-medical: Not on file  Tobacco Use  . Smoking status: Never Smoker  . Smokeless tobacco: Never Used  Substance and Sexual Activity  . Alcohol use: No    Alcohol/week: 0.0 standard drinks  . Drug use: No  . Sexual activity: Never  Lifestyle  . Physical activity:    Days per week: Not on file    Minutes per session: Not on file  . Stress: Not on file  Relationships  . Social connections:    Talks on phone: Not on file    Gets together:  Not on file    Attends religious service: Not on file    Active member of club or organization: Not on file    Attends meetings of clubs or organizations: Not on file    Relationship status: Not on file  . Intimate partner violence:    Fear of current or ex partner: Not on file    Emotionally abused: Not on file    Physically abused: Not on file    Forced sexual activity: Not on file  Other Topics Concern  . Not on file  Social History Narrative  . Not on file   Functional Status Survey:    Allergies  Allergen Reactions  . Codeine Phosphate Shortness Of Breath    REACTION: unspecified  . Amoxicillin Nausea And Vomiting    REACTION: unspecified  . Penicillins Nausea And Vomiting    Has patient had a PCN reaction causing immediate rash, facial/tongue/throat swelling, SOB or lightheadedness with hypotension:YES Has patient had a PCN reaction causing severe rash involving mucus membranes or skin necrosis: NO Has patient had a PCN reaction that required hospitalization NO Has patient had a PCN reaction occurring within the last 10 years: NO If all of the above answers are "NO", then may proceed with Cephalosporin use.  Marland Kitchen Morphine And Related Nausea And Vomiting  . Lidocaine Rash    Patch caused rash    Pertinent  Health Maintenance Due  Topic Date Due  . INFLUENZA VACCINE  06/21/2018 (Originally 02/09/2018)  . MAMMOGRAM  06/21/2018 (Originally 05/15/2011)  . DEXA SCAN  06/21/2018 (Originally 06/26/2016)  . COLONOSCOPY  06/21/2018 (Originally 06/26/2001)  . PNA vac Low Risk Adult (1 of 2 - PCV13) 06/21/2018 (Originally 06/26/2016)    Medications: Outpatient Encounter Medications as of 05/29/2018  Medication Sig  . acetaminophen (TYLENOL) 325 MG tablet Take 2 tablets (650 mg total) by mouth every 6 (six) hours.  Marland Kitchen allopurinol (ZYLOPRIM) 100 MG tablet Take 400 mg by mouth daily.  Marland Kitchen aspirin 81 MG tablet Take 81 mg by mouth daily.    Marland Kitchen atorvastatin (LIPITOR) 40 MG tablet Take 2  tablets (80 mg total) by mouth daily at 6 PM.  . clopidogrel (PLAVIX) 75 MG tablet Take 1 tablet (75 mg total) by mouth daily.  Marland Kitchen ezetimibe (ZETIA) 10 MG tablet Take 1 tablet (10 mg total) by mouth daily.  . ferrous sulfate 325 (65  FE) MG tablet Take 1 tablet (325 mg total) by mouth 3 (three) times daily with meals.  . folic acid (FOLVITE) 1 MG tablet Take 1 mg by mouth daily. Takes with chemo medication.  . furosemide (LASIX) 40 MG tablet TAKE 1 TABLET BY MOUTH  DAILY AND MAY TAKE AN  ADDITIONAL 1 TABLET DAILY  AS NEEDED FOR SWELLING.  . gabapentin (NEURONTIN) 300 MG capsule Take 1 capsule (300 mg total) by mouth 3 (three) times daily.  Marland Kitchen HYDROmorphone (DILAUDID) 2 MG tablet Take 1 tablet (2 mg total) by mouth every 3 (three) hours as needed for severe pain.  Marland Kitchen levocetirizine (XYZAL) 5 MG tablet Take 1 tablet by mouth daily.  . Melatonin 3 MG TABS Take 3 mg by mouth at bedtime as needed.  . metoprolol tartrate (LOPRESSOR) 50 MG tablet TAKE 1 TABLET BY MOUTH ONCE DAILY  . nitroGLYCERIN (NITROSTAT) 0.4 MG SL tablet DISSOLVE 1 TABLET UNDER THE TONGUE EVERY 5 MINUTES AS  NEEDED (NOT TO EXCEED 3  TABLETS IN 15 MINS, CALL  911 IF CHEST PAIN PERSISTS  . Omega-3 Fatty Acids (FISH OIL) 1000 MG CAPS Take 1 capsule by mouth daily.   . pantoprazole (PROTONIX) 40 MG tablet Take 40 mg by mouth daily.  . predniSONE (DELTASONE) 20 MG tablet Take 1 tablet (20 mg total) by mouth daily with breakfast.  . ramipril (ALTACE) 2.5 MG capsule Take 1 capsule (2.5 mg total) by mouth daily.  Marland Kitchen senna-docusate (SENOKOT-S) 8.6-50 MG tablet Take 1 tablet by mouth daily as needed for mild constipation.   No facility-administered encounter medications on file as of 05/29/2018.      Review of Systems   In general she is not complaining of any fever or chills.  Skin does not complain of rashes or itching does have stitches on her lower right arm she would like to have removed fairly soon if possible.  Head ears eyes nose  mouth throat does not complain of visual changes or sore throat.  Respiratory is denying shortness of breath or cough.  Cardiac is not complaining of chest pain has baseline lower extremity edema.  GI is not complaining of abdominal pain nausea vomiting diarrhea constipation says she is having regular bowel movements.  GU is not complaining of dysuria.  Musculoskeletal says at this point her joint pain leg pain is controlled she tries to minimize her use of Dilaudid.  Neurologic does not complain of dizziness headache or numbness.  And psych does not complain of being depressed or anxious appears to be in good spirits looking forward to going home  Vitals:   05/29/18 1422  BP: (!) 150/83  Pulse: 77  Resp: 17  Temp: 98.1 F (36.7 C)  TempSrc: Oral  SpO2: 97%  Manual blood pressure was 120/62.  Weight is 213 pounds Physical Exam   In general this is a pleasant middle-age female in no distress sitting comfortably in her chair.  Her skin is warm and dry she does have stitching lower right arm I do not see any drainage bleeding or sign of infection.  Eyes visual acuity appears to be intact sclera and conjunctive are clear.  Oropharynx clear mucous membranes moist.  Chest is clear to auscultation there is no labored breathing.  Heart is regular rate and rhythm without murmur gallop or rub she has mild lower extremity edema appears to be relatively baseline.  Abdomen is soft nontender with positive bowel sounds.  Musculoskeletal does have a splint for her right upper  extremity- also bracing of her right lower extremity with Ace wrapping- capillary refill appears to be intact lower extremities bilaterally.  Moves her left upper extremity lower extremity it appears at baseline.  Left third toe I could not really appreciate any deformity erythema or sign of infection there is some mild tenderness to palpation.  Neurologic is grossly intact her speech is clear no lateralizing  findings.  Psych she is alert and oriented pleasant and appropriate    Labs reviewed: Basic Metabolic Panel: Recent Labs    05/15/18 0443  05/20/18 0307 05/21/18 0244 05/22/18 0224  NA 134*   < > 134* 135 135  K 4.5   < > 3.7 4.3 3.7  CL 103   < > 98 103 102  CO2 24   < > 28 25 28   GLUCOSE 115*   < > 104* 95 86  BUN 12   < > 14 14 9   CREATININE 0.54   < > 0.59 0.57 0.67  CALCIUM 8.8*   < > 8.1* 8.2* 8.3*  MG 2.1  --   --   --   --    < > = values in this interval not displayed.   Liver Function Tests: Recent Labs    05/20/18 0307 05/21/18 0244 05/22/18 0224  AST 17 20 23   ALT 38 31 45*  ALKPHOS 59 55 69  BILITOT 0.8 1.6* 0.8  PROT 5.0* 5.0* 5.1*  ALBUMIN 2.2* 2.3* 2.4*   No results for input(s): LIPASE, AMYLASE in the last 8760 hours. No results for input(s): AMMONIA in the last 8760 hours. CBC: Recent Labs    05/20/18 0307 05/21/18 0244 05/22/18 0224  WBC 8.2 8.8 7.9  NEUTROABS 5.9 6.3 5.0  HGB 7.9* 8.3* 8.5*  HCT 25.2* 26.8* 27.4*  MCV 93.3 93.4 94.5  PLT 308 368 350   Cardiac Enzymes: No results for input(s): CKTOTAL, CKMB, CKMBINDEX, TROPONINI in the last 8760 hours. BNP: Invalid input(s): POCBNP CBG: No results for input(s): GLUCAP in the last 8760 hours.  Procedures and Imaging Studies During Stay: Dg Chest 2 View  Result Date: 05/14/2018 CLINICAL DATA:  67 year old female with fall. History of CHF. EXAM: CHEST - 2 VIEW COMPARISON:  Chest CT dated 05/14/2018 FINDINGS: There is cardiomegaly with probable mild vascular congestion. No focal consolidation, pleural effusion, or pneumothorax. No acute osseous pathology. IMPRESSION: Cardiomegaly with probable mild vascular congestion. No focal consolidation. Electronically Signed   By: Anner Crete M.D.   On: 05/14/2018 01:10   Dg Pelvis 1-2 Views  Result Date: 05/14/2018 CLINICAL DATA:  Fall EXAM: PELVIS - 1-2 VIEW COMPARISON:  None. FINDINGS: There is no evidence of pelvic fracture or diastasis.  No pelvic bone lesions are seen. IMPRESSION: Negative. Electronically Signed   By: Kerby Moors M.D.   On: 05/14/2018 01:10   Dg Forearm Right  Result Date: 05/14/2018 CLINICAL DATA:  Fall.  Right wrist pain. EXAM: RIGHT FOREARM - 2 VIEW COMPARISON:  None. FINDINGS: There is a an acute intra-articular fracture deformity involving the distal radius. Dorsal angulation of the distal fracture fragments noted. No dislocation identified. IMPRESSION: 1. Acute fracture involves the dorsal aspect of the distal radius. Mild dorsal angulation of the distal fracture fragments. Electronically Signed   By: Kerby Moors M.D.   On: 05/14/2018 01:07   Dg Wrist Complete Left  Result Date: 05/14/2018 CLINICAL DATA:  67 year old female with fall and left wrist pain. EXAM: LEFT WRIST - COMPLETE 3+ VIEW COMPARISON:  None. FINDINGS:  There is no acute fracture or dislocation. The bones are osteopenic. No significant arthritic changes. There is soft tissue swelling over the wrist and ulna. No radiopaque foreign object or soft tissue gas. IMPRESSION: No acute fracture or dislocation. Electronically Signed   By: Anner Crete M.D.   On: 05/14/2018 00:13   Dg Wrist Complete Right  Result Date: 05/15/2018 CLINICAL DATA:  Postoperative right wrist EXAM: RIGHT WRIST - COMPLETE 3+ VIEW COMPARISON:  Intraoperative fluoroscopy 05/15/2018 FINDINGS: Postoperative plate and screw fixation of the distal right radial metaphysis. Are where components appear intact and well seated. Near-anatomic alignment of the fracture fragments. Degenerative changes in the radiocarpal and STT joints. Overlying splint material obscures some bone detail. IMPRESSION: Postoperative plate and screw fixation of the distal right radial metaphysis without apparent complication. Electronically Signed   By: Lucienne Capers M.D.   On: 05/15/2018 22:09   Dg Wrist Complete Right  Result Date: 05/15/2018 CLINICAL DATA:  67 year old female with right distal  radial fracture. Subsequent encounter. EXAM: DG C-ARM 61-120 MIN; RIGHT WRIST - COMPLETE 3+ VIEW Fluoroscopic time: 49.7 seconds. COMPARISON:  05/14/2018. FINDINGS: Five intraoperative C-arm views submitted for review after surgery. Distal right radial fracture reduced utilizing plate and screws. Much better alignment of fracture fragments with minimal separation/incongruity of articular surface. IMPRESSION: Open reduction and internal fixation distal right radius fracture. Electronically Signed   By: Genia Del M.D.   On: 05/15/2018 20:07   Dg Wrist Complete Right  Result Date: 05/14/2018 CLINICAL DATA:  Pain after fall EXAM: RIGHT WRIST - COMPLETE 3+ VIEW COMPARISON:  None. FINDINGS: There is a comminuted mildly displaced fracture through the distal radial metaphysis. IMPRESSION: Comminuted mildly displaced fracture through the distal radial metaphysis. Electronically Signed   By: Dorise Bullion III M.D   On: 05/14/2018 00:12   Dg Knee 2 Views Right  Result Date: 05/14/2018 CLINICAL DATA:  Pain after fall EXAM: RIGHT KNEE - 1-2 VIEW COMPARISON:  None. FINDINGS: A hemarthrosis is identified. There is a depressed lateral tibial plateau fracture which is comminuted. IMPRESSION: Comminuted mildly depressed lateral tibial plateau fracture with resulting hemarthrosis. Electronically Signed   By: Dorise Bullion III M.D   On: 05/14/2018 00:08   Dg Tibia/fibula Right  Result Date: 05/15/2018 CLINICAL DATA:  67 year old female with tibial plateau fracture and right ankle fracture. Subsequent encounter. EXAM: RIGHT TIBIA AND FIBULA - 2 VIEW; DG C-ARM 61-120 MIN Fluoroscopic time: 3 minutes and 8 seconds. COMPARISON:  05/14/2018 CT. FINDINGS: Eleven intraoperative film submitted for review after surgery. Six intraoperative C-arm views of the right knee: Reduction of lateral tibial plateau fracture with sideplate and screws. Articular surface with better alignment although with slight depression. Five  intraoperative C-arm views of the right ankle: Reduction of fibular fracture with sideplate and screws. Screws transfix the distal fibula-tibia joint space. Reduction of medial malleolar fracture with screw. Evaluation of posterior tibial fracture limited secondary to overlying plate. IMPRESSION: Open reduction and internal fixation right knee and right ankle fractures as noted above. Electronically Signed   By: Genia Del M.D.   On: 05/15/2018 17:57   Dg Tibia/fibula Right  Result Date: 05/14/2018 CLINICAL DATA:  Pain after fall EXAM: RIGHT TIBIA AND FIBULA - 2 VIEW COMPARISON:  None. FINDINGS: There is a comminuted depressed fracture through the lateral tibial plateau. There is a comminuted fracture through the distal fibular diaphysis. There is a lucency through the distal medial malleolus with a step-off. These hemarthrosis in the suprapatellar fossa. IMPRESSION: 1.  Depressed comminuted lateral tibial plateau fracture. 2. Comminuted fracture through the distal fibular diaphysis. 3. Fracture through the distal medial malleolus. Electronically Signed   By: Dorise Bullion III M.D   On: 05/14/2018 00:14   Dg Ankle Complete Right  Result Date: 05/14/2018 CLINICAL DATA:  Pain after fall EXAM: RIGHT ANKLE - COMPLETE 3+ VIEW COMPARISON:  None. FINDINGS: There is a comminuted fracture through the distal fibular diaphysis. There is a fracture through the medial malleolus. There also be appears to be a fracture through the distal tibia where it abuts the fibula. Irregularity of the distal most fibula is consistent with an age indeterminate fracture but may not be acute. Soft tissue swelling. IMPRESSION: 1. Fracture through the medial malleolus. 2. Apparent fracture through the lateral aspect of the distal tibia with displacement. 3. Fracture through the fibular diaphysis distally. 4. Irregularity of the distal tip of the fibula may be nonacute from previous injury. 5. No definitive posterior malleolar fracture.  However, given the complex nature of the ankle fracture, consider a CT scan for better evaluation. Electronically Signed   By: Dorise Bullion III M.D   On: 05/14/2018 00:17   Ct Head Wo Contrast  Result Date: 05/13/2018 CLINICAL DATA:  67 year old female with facial trauma. EXAM: CT HEAD WITHOUT CONTRAST CT MAXILLOFACIAL WITHOUT CONTRAST CT CERVICAL SPINE WITHOUT CONTRAST TECHNIQUE: Multidetector CT imaging of the head, cervical spine, and maxillofacial structures were performed using the standard protocol without intravenous contrast. Multiplanar CT image reconstructions of the cervical spine and maxillofacial structures were also generated. COMPARISON:  Head CT dated 07/23/2007 FINDINGS: CT HEAD FINDINGS Brain: The ventricles and sulci appropriate size for patient's age. The gray-white matter discrimination is preserved. There is no acute intracranial hemorrhage. No mass effect or midline shift. No extra-axial fluid collection. Vascular: No hyperdense vessel or unexpected calcification. Skull: Normal. Negative for fracture or focal lesion. Other: None. CT MAXILLOFACIAL FINDINGS Osseous: No fracture or mandibular dislocation. No destructive process. Orbits: Negative. No traumatic or inflammatory finding. Sinuses: Clear. Soft tissues: Negative. CT CERVICAL SPINE FINDINGS Alignment: No acute subluxation. Straightening of normal cervical lordosis which may be positional or due to muscle spasm. Skull base and vertebrae: No acute fracture. Soft tissues and spinal canal: No prevertebral fluid or swelling. No visible canal hematoma. Disc levels:  Degenerative changes at C5-C6. Upper chest: Negative. Other: None IMPRESSION: 1. No acute intracranial pathology. 2. No acute facial bone fractures. 3. No acute/traumatic cervical spine pathology. Electronically Signed   By: Anner Crete M.D.   On: 05/13/2018 23:40   Ct Chest W Contrast  Result Date: 05/14/2018 CLINICAL DATA:  67 year old female with trauma. EXAM: CT  CHEST, ABDOMEN, AND PELVIS WITH CONTRAST TECHNIQUE: Multidetector CT imaging of the chest, abdomen and pelvis was performed following the standard protocol during bolus administration of intravenous contrast. CONTRAST:  114mL ISOVUE-300 IOPAMIDOL (ISOVUE-300) INJECTION 61% COMPARISON:  CT of the abdomen pelvis dated 12/02/2016 FINDINGS: CT CHEST FINDINGS Cardiovascular: There is mild cardiomegaly. No pericardial effusion. Multi vessel coronary vascular calcification. Mild atherosclerotic calcification of the thoracic aorta. No aneurysmal dilatation or dissection. The visualized origins of the great vessels of the aortic arch are patent. The central pulmonary arteries are grossly unremarkable. Mediastinum/Nodes: No hilar or mediastinal adenopathy. Esophagus and thyroid gland are grossly unremarkable. No mediastinal fluid collection. Lungs/Pleura: Bibasilar linear atelectasis/scarring. The lungs are clear. There is no pleural effusion or pneumothorax. The central airways are patent. Musculoskeletal: No chest wall mass or suspicious bone lesions identified. CT ABDOMEN PELVIS  FINDINGS No intra-abdominal free air or free fluid. Hepatobiliary: The liver is unremarkable. Cholecystectomy. Mild intrahepatic biliary ductal dilatation. Pancreas: Unremarkable. No pancreatic ductal dilatation or surrounding inflammatory changes. Spleen: Normal in size without focal abnormality. Adrenals/Urinary Tract: The adrenal glands are unremarkable. Small nonobstructing right renal inferior pole hypodense focus which is too small to characterize. There is no hydronephrosis on either side. There is symmetric enhancement and excretion of contrast by both kidneys. The visualized ureters and urinary bladder appear unremarkable. Stomach/Bowel: There is sigmoid diverticulosis without active inflammatory changes. A gastric lap band is noted. There is no bowel obstruction or active inflammation. The appendix is not visualized with certainty. No  inflammatory changes identified in the right lower quadrant. Vascular/Lymphatic: Moderate aortoiliac atherosclerotic disease. No portal venous gas. There is no adenopathy. Reproductive: The uterus is anteverted and grossly unremarkable. Other: None Musculoskeletal: Grade 1 L4-L5 anterolisthesis. No acute osseous pathology. IMPRESSION: No acute intrathoracic, abdominal, or pelvic pathology. Electronically Signed   By: Anner Crete M.D.   On: 05/14/2018 02:42   Ct Cervical Spine Wo Contrast  Result Date: 05/13/2018 CLINICAL DATA:  67 year old female with facial trauma. EXAM: CT HEAD WITHOUT CONTRAST CT MAXILLOFACIAL WITHOUT CONTRAST CT CERVICAL SPINE WITHOUT CONTRAST TECHNIQUE: Multidetector CT imaging of the head, cervical spine, and maxillofacial structures were performed using the standard protocol without intravenous contrast. Multiplanar CT image reconstructions of the cervical spine and maxillofacial structures were also generated. COMPARISON:  Head CT dated 07/23/2007 FINDINGS: CT HEAD FINDINGS Brain: The ventricles and sulci appropriate size for patient's age. The gray-white matter discrimination is preserved. There is no acute intracranial hemorrhage. No mass effect or midline shift. No extra-axial fluid collection. Vascular: No hyperdense vessel or unexpected calcification. Skull: Normal. Negative for fracture or focal lesion. Other: None. CT MAXILLOFACIAL FINDINGS Osseous: No fracture or mandibular dislocation. No destructive process. Orbits: Negative. No traumatic or inflammatory finding. Sinuses: Clear. Soft tissues: Negative. CT CERVICAL SPINE FINDINGS Alignment: No acute subluxation. Straightening of normal cervical lordosis which may be positional or due to muscle spasm. Skull base and vertebrae: No acute fracture. Soft tissues and spinal canal: No prevertebral fluid or swelling. No visible canal hematoma. Disc levels:  Degenerative changes at C5-C6. Upper chest: Negative. Other: None  IMPRESSION: 1. No acute intracranial pathology. 2. No acute facial bone fractures. 3. No acute/traumatic cervical spine pathology. Electronically Signed   By: Anner Crete M.D.   On: 05/13/2018 23:40   Ct Knee Right Wo Contrast  Result Date: 05/14/2018 CLINICAL DATA:  67 year old female with fall and right knee pain. EXAM: CT OF THE right KNEE WITHOUT CONTRAST TECHNIQUE: Multidetector CT imaging of the right knee was performed according to the standard protocol. Multiplanar CT image reconstructions were also generated. COMPARISON:  Earlier radiograph dated 05/13/2018 FINDINGS: Bones/Joint/Cartilage There is a comminuted intra-articular fracture of the proximal tibia with mild depression of the lateral tibial plateau. The bones are osteopenic. There is no dislocation. There is a large suprapatellar lipohemarthrosis. Ligaments Suboptimally assessed by CT. Muscles and Tendons No acute findings. No intramuscular hematoma. Soft tissues There is diffuse subcutaneous stranding of the lateral knee. No fluid collection. IMPRESSION: 1. Comminuted intra-articular fracture of the proximal tibia with mild depression of the lateral tibial plateau. 2. Large suprapatellar lipohemarthrosis. Electronically Signed   By: Anner Crete M.D.   On: 05/14/2018 02:33   Ct Abdomen Pelvis W Contrast  Result Date: 05/14/2018 CLINICAL DATA:  67 year old female with trauma. EXAM: CT CHEST, ABDOMEN, AND PELVIS WITH CONTRAST TECHNIQUE: Multidetector CT  imaging of the chest, abdomen and pelvis was performed following the standard protocol during bolus administration of intravenous contrast. CONTRAST:  175mL ISOVUE-300 IOPAMIDOL (ISOVUE-300) INJECTION 61% COMPARISON:  CT of the abdomen pelvis dated 12/02/2016 FINDINGS: CT CHEST FINDINGS Cardiovascular: There is mild cardiomegaly. No pericardial effusion. Multi vessel coronary vascular calcification. Mild atherosclerotic calcification of the thoracic aorta. No aneurysmal dilatation or  dissection. The visualized origins of the great vessels of the aortic arch are patent. The central pulmonary arteries are grossly unremarkable. Mediastinum/Nodes: No hilar or mediastinal adenopathy. Esophagus and thyroid gland are grossly unremarkable. No mediastinal fluid collection. Lungs/Pleura: Bibasilar linear atelectasis/scarring. The lungs are clear. There is no pleural effusion or pneumothorax. The central airways are patent. Musculoskeletal: No chest wall mass or suspicious bone lesions identified. CT ABDOMEN PELVIS FINDINGS No intra-abdominal free air or free fluid. Hepatobiliary: The liver is unremarkable. Cholecystectomy. Mild intrahepatic biliary ductal dilatation. Pancreas: Unremarkable. No pancreatic ductal dilatation or surrounding inflammatory changes. Spleen: Normal in size without focal abnormality. Adrenals/Urinary Tract: The adrenal glands are unremarkable. Small nonobstructing right renal inferior pole hypodense focus which is too small to characterize. There is no hydronephrosis on either side. There is symmetric enhancement and excretion of contrast by both kidneys. The visualized ureters and urinary bladder appear unremarkable. Stomach/Bowel: There is sigmoid diverticulosis without active inflammatory changes. A gastric lap band is noted. There is no bowel obstruction or active inflammation. The appendix is not visualized with certainty. No inflammatory changes identified in the right lower quadrant. Vascular/Lymphatic: Moderate aortoiliac atherosclerotic disease. No portal venous gas. There is no adenopathy. Reproductive: The uterus is anteverted and grossly unremarkable. Other: None Musculoskeletal: Grade 1 L4-L5 anterolisthesis. No acute osseous pathology. IMPRESSION: No acute intrathoracic, abdominal, or pelvic pathology. Electronically Signed   By: Anner Crete M.D.   On: 05/14/2018 02:42   Ct Ankle Right Wo Contrast  Result Date: 05/14/2018 CLINICAL DATA:  67 year old female  with fall and right ankle pain. EXAM: CT OF THE RIGHT ANKLE WITHOUT CONTRAST TECHNIQUE: Multidetector CT imaging of the right ankle was performed according to the standard protocol. Multiplanar CT image reconstructions were also generated. COMPARISON:  Right ankle radiograph dated 05/13/2018 FINDINGS: Bones/Joint/Cartilage There is a comminuted fracture of the distal fibula with extension of the fracture into the distal tibia-fibular syndesmosis. There is a minimally displaced fracture of the lateral aspect of the tibial plafond and nondisplaced fracture of the posterior malleolus. There is a minimally displaced fracture of the medial malleolus. There bones are osteopenic. Discontinuity of the plantar cortex of the medial cuneiform (series 5, image 58 and sagittal series 6, image 89) concerning for an avulsion fracture. There is also linear lucency through the plantar base of the first metatarsal (series 6, image 90) also concerning for an avulsion corner fracture. Probable nondisplaced fracture of the dorsal aspect of the medial cuneiform (series 4, image 146 and sagittal series 6, image 69). Fracture of the base of the second metatarsal (series 6, image 73 and series 4, image 161). Nondisplaced linear lucency through the proximal fourth metatarsal (series 6, image 58) may be artifactual or represent a vascular groove or a nondisplaced fracture. No dislocation. Diffuse soft tissue edema. Ligaments Suboptimally assessed by CT. Muscles and Tendons No acute intramuscular findings. Soft tissues Diffuse subcutaneous edema. IMPRESSION: 1. Comminuted fracture of the distal fibula with extension of the fracture into the distal tibia-fibular syndesmosis. 2. Minimally displaced fracture of the lateral aspect of the tibial plafond and nondisplaced fracture of the posterior malleolus. 3. Minimally  displaced fracture of the medial malleolus. 4. Nondisplaced fracture of the base of the second metatarsal. 5. Linear lucency  through the plantar cortex of the medial cuneiform concerning for an avulsion fracture. 6. Linear lucency through the proximal fourth metatarsal may be artifactual or represent a nondisplaced fracture. Electronically Signed   By: Anner Crete M.D.   On: 05/14/2018 02:55   Dg Shoulder Left  Result Date: 05/14/2018 CLINICAL DATA:  General left shoulder pain.  Pain after fall. EXAM: LEFT SHOULDER - 2+ VIEW COMPARISON:  None. FINDINGS: There is no evidence of fracture or dislocation. There is no evidence of arthropathy or other focal bone abnormality. Soft tissues are unremarkable. IMPRESSION: Negative. Electronically Signed   By: Dorise Bullion III M.D   On: 05/14/2018 00:07   Dg Knee Right Port  Result Date: 05/15/2018 CLINICAL DATA:  Postoperative right knee EXAM: PORTABLE RIGHT KNEE - 1-2 VIEW COMPARISON:  Intraoperative fluoroscopy 05/15/2018 FINDINGS: Postoperative changes with lateral plate and screw fixation of the proximal tibial metaphysis and lateral tibial plateau. Hardware components appear intact and well seated. Degenerative changes in the right knee. Soft tissue gas consistent with recent surgery. IMPRESSION: Postoperative internal fixation of lateral tibial plateau fracture. Electronically Signed   By: Lucienne Capers M.D.   On: 05/15/2018 22:06   Dg Ankle Right Port  Result Date: 05/15/2018 CLINICAL DATA:  Postoperative right ankle EXAM: PORTABLE RIGHT ANKLE - 2 VIEW COMPARISON:  Intraoperative fluoroscopy 05/15/2018 FINDINGS: Postoperative changes with plate and screw fixation of a fracture of the distal fibular shaft and screw fixation of the medial malleolar fracture fragment. Hardware components appear intact and well seated. Near-anatomic alignment of the fracture fragments. Overlying splint material obscures bone detail. IMPRESSION: Postoperative internal fixation of fractures of the right distal fibula and medial malleolus. Electronically Signed   By: Lucienne Capers M.D.    On: 05/15/2018 22:08   Dg Hand Complete Right  Result Date: 05/14/2018 CLINICAL DATA:  67 year old female with fall and trauma to the right hand. EXAM: RIGHT HAND - COMPLETE 3+ VIEW COMPARISON:  Right wrist radiograph dated 05/13/2018 FINDINGS: Comminuted mildly displaced intra-articular fracture of the distal radius as seen on the earlier wrist radiograph. No other acute fracture noted. The bones are osteopenic. No dislocation. Soft tissue swelling of the wrist. IMPRESSION: Comminuted mildly displaced intra-articular fracture of the distal radius. Electronically Signed   By: Anner Crete M.D.   On: 05/14/2018 01:09   Dg C-arm 1-60 Min  Result Date: 05/15/2018 CLINICAL DATA:  67 year old female with right distal radial fracture. Subsequent encounter. EXAM: DG C-ARM 61-120 MIN; RIGHT WRIST - COMPLETE 3+ VIEW Fluoroscopic time: 49.7 seconds. COMPARISON:  05/14/2018. FINDINGS: Five intraoperative C-arm views submitted for review after surgery. Distal right radial fracture reduced utilizing plate and screws. Much better alignment of fracture fragments with minimal separation/incongruity of articular surface. IMPRESSION: Open reduction and internal fixation distal right radius fracture. Electronically Signed   By: Genia Del M.D.   On: 05/15/2018 20:07   Dg C-arm 1-60 Min  Result Date: 05/15/2018 CLINICAL DATA:  67 year old female with tibial plateau fracture and right ankle fracture. Subsequent encounter. EXAM: RIGHT TIBIA AND FIBULA - 2 VIEW; DG C-ARM 61-120 MIN Fluoroscopic time: 3 minutes and 8 seconds. COMPARISON:  05/14/2018 CT. FINDINGS: Eleven intraoperative film submitted for review after surgery. Six intraoperative C-arm views of the right knee: Reduction of lateral tibial plateau fracture with sideplate and screws. Articular surface with better alignment although with slight depression. Five intraoperative C-arm  views of the right ankle: Reduction of fibular fracture with sideplate and  screws. Screws transfix the distal fibula-tibia joint space. Reduction of medial malleolar fracture with screw. Evaluation of posterior tibial fracture limited secondary to overlying plate. IMPRESSION: Open reduction and internal fixation right knee and right ankle fractures as noted above. Electronically Signed   By: Genia Del M.D.   On: 05/15/2018 17:57   Dg C-arm 1-60 Min  Result Date: 05/15/2018 CLINICAL DATA:  67 year old female with tibial plateau fracture and right ankle fracture. Subsequent encounter. EXAM: RIGHT TIBIA AND FIBULA - 2 VIEW; DG C-ARM 61-120 MIN Fluoroscopic time: 3 minutes and 8 seconds. COMPARISON:  05/14/2018 CT. FINDINGS: Eleven intraoperative film submitted for review after surgery. Six intraoperative C-arm views of the right knee: Reduction of lateral tibial plateau fracture with sideplate and screws. Articular surface with better alignment although with slight depression. Five intraoperative C-arm views of the right ankle: Reduction of fibular fracture with sideplate and screws. Screws transfix the distal fibula-tibia joint space. Reduction of medial malleolar fracture with screw. Evaluation of posterior tibial fracture limited secondary to overlying plate. IMPRESSION: Open reduction and internal fixation right knee and right ankle fractures as noted above. Electronically Signed   By: Genia Del M.D.   On: 05/15/2018 17:57   Ct Maxillofacial Wo Contrast  Result Date: 05/13/2018 CLINICAL DATA:  67 year old female with facial trauma. EXAM: CT HEAD WITHOUT CONTRAST CT MAXILLOFACIAL WITHOUT CONTRAST CT CERVICAL SPINE WITHOUT CONTRAST TECHNIQUE: Multidetector CT imaging of the head, cervical spine, and maxillofacial structures were performed using the standard protocol without intravenous contrast. Multiplanar CT image reconstructions of the cervical spine and maxillofacial structures were also generated. COMPARISON:  Head CT dated 07/23/2007 FINDINGS: CT HEAD FINDINGS Brain:  The ventricles and sulci appropriate size for patient's age. The gray-white matter discrimination is preserved. There is no acute intracranial hemorrhage. No mass effect or midline shift. No extra-axial fluid collection. Vascular: No hyperdense vessel or unexpected calcification. Skull: Normal. Negative for fracture or focal lesion. Other: None. CT MAXILLOFACIAL FINDINGS Osseous: No fracture or mandibular dislocation. No destructive process. Orbits: Negative. No traumatic or inflammatory finding. Sinuses: Clear. Soft tissues: Negative. CT CERVICAL SPINE FINDINGS Alignment: No acute subluxation. Straightening of normal cervical lordosis which may be positional or due to muscle spasm. Skull base and vertebrae: No acute fracture. Soft tissues and spinal canal: No prevertebral fluid or swelling. No visible canal hematoma. Disc levels:  Degenerative changes at C5-C6. Upper chest: Negative. Other: None IMPRESSION: 1. No acute intracranial pathology. 2. No acute facial bone fractures. 3. No acute/traumatic cervical spine pathology. Electronically Signed   By: Anner Crete M.D.   On: 05/13/2018 23:40    Assessment/Plan:    #1 history of multiple fractures including surgical repair of right lateral tibial plateau fracture as well as right trimalleolar ankle fracture and right distal radius fracture- she apparently has done well with therapy-she will have orthopedic follow-up next week she does have Dilaudid as needed for pain as well as Neurontin and Tylenol- at this point will need continued PT and OT and will need assistance at home again she does have a wrap at her house she will need a rolling walker as well as bedside commode and a wheelchair.  Also will have orthopedic contacted about timetable for suture removal  2.-  History of rheumatoid arthritis currently on prednisone 20 mg a day she has been on prednisone for apparently an extended period of time her DMA RD is currently on hold  awaiting orthopedic  opinion.  3.  Diastolic CHF her weight appears to be relatively stable as well as edema this appears to be compensated at this point on Lasix 40 mg a day- update BMP is pending for mild to assure stability of electrolytes and renal function.  4.  History of anemia she is on iron thought to be combination of postop and chronic disease-hemoglobin 8.5 on lab done last week this appears to be slowly trending up update lab is pending for tomorrow.  5.  History of hypertension continues on Lopressor and ramipril manual blood pressure today was 940/76 earlier systolic was mildly elevated at 150 at this point will defer to primary care provider for any aggressive follow-up-it does not appear she has consistent elevated readings.  6.  History of coronary artery disease this is been stable during her stay here she is on Plavix aspirin and a beta-blocker.  She is also on a statin.  7.  History of hyperlipidemia continues on a statin and fish oil since her stay here was quite short will defer to primary care provider for follow-up of labs.  10.  History of gout she continues on allopurinol empirically.  9.  Allergic rhinitis continues on Zyrtec.  10.  History of left third toe pain after apparently some mild trauma prior to the weekend-will obtain an x-ray of the area.  She says is feeling a bit better today.  Again she will be going home with her husband she does have a ramp  at home she will need a rolling walker as well as bedside commode and wheelchair-as well as continued PT and OT are nursing support.  Will discharge on Dilaudid 2 mg every 4 hours as needed which she has been on here will give 20 tablets and await orthopedic opinion for any refills  CPT- 99316-of note greater than 30 minutes spent on this discharge summary-greater than 50% of time spent coordinating a plan of care for numerous diagnoses

## 2018-05-30 ENCOUNTER — Encounter (HOSPITAL_COMMUNITY)
Admission: RE | Admit: 2018-05-30 | Discharge: 2018-05-30 | Disposition: A | Discharge status: Home / Self Care | Payer: Medicare Other | Source: Skilled Nursing Facility | Attending: Internal Medicine | Admitting: Internal Medicine

## 2018-05-30 ENCOUNTER — Other Ambulatory Visit: Payer: Self-pay | Admitting: *Deleted

## 2018-05-30 LAB — BASIC METABOLIC PANEL
ANION GAP: 9 (ref 5–15)
BUN: 12 mg/dL (ref 8–23)
CO2: 27 mmol/L (ref 22–32)
Calcium: 9 mg/dL (ref 8.9–10.3)
Chloride: 102 mmol/L (ref 98–111)
Creatinine, Ser: 0.59 mg/dL (ref 0.44–1.00)
GFR calc Af Amer: >60 mL/min (ref >60)
GLUCOSE: 140 mg/dL — AB (ref 70–99)
POTASSIUM: 3.1 mmol/L — AB (ref 3.5–5.1)
SODIUM: 138 mmol/L (ref 135–145)

## 2018-05-30 LAB — CBC
HCT: 38 % (ref 36.0–46.0)
HEMOGLOBIN: 11.3 g/dL — AB (ref 12.0–15.0)
MCH: 29 pg (ref 26.0–34.0)
MCHC: 29.7 g/dL — AB (ref 30.0–36.0)
MCV: 97.4 fL (ref 80.0–100.0)
Platelets: 449 10*3/uL — ABNORMAL HIGH (ref 150–400)
RBC: 3.9 MIL/uL (ref 3.87–5.11)
RDW: 15.8 % — ABNORMAL HIGH (ref 11.5–15.5)
WBC: 7.4 10*3/uL (ref 4.0–10.5)
nRBC: 0 % (ref 0.0–0.2)

## 2018-05-30 NOTE — Patient Outreach (Signed)
[facility]) Care Management  05/30/2018  NYAZIA CANEVARI 10/19/1950 [id number]  Onsite meeting at [facility]  Patient discharging home today. She will have support of her spouse and Advancing to home program will be following her upon discharge.   No [facility] Care management needs identified. Will sign off. Royetta Crochet. Laymond Purser, RN, BSN, [place] [phone]) Business Cell  [phone]) Toll Free Office

## 2018-05-31 DIAGNOSIS — Z6839 Body mass index (BMI) 39.0-39.9, adult: Secondary | ICD-10-CM | POA: Diagnosis not present

## 2018-05-31 DIAGNOSIS — S52571D Other intraarticular fracture of lower end of right radius, subsequent encounter for closed fracture with routine healing: Secondary | ICD-10-CM | POA: Diagnosis not present

## 2018-05-31 DIAGNOSIS — Z9884 Bariatric surgery status: Secondary | ICD-10-CM | POA: Diagnosis not present

## 2018-05-31 DIAGNOSIS — W108XXD Fall (on) (from) other stairs and steps, subsequent encounter: Secondary | ICD-10-CM | POA: Diagnosis not present

## 2018-05-31 DIAGNOSIS — Z8673 Personal history of transient ischemic attack (TIA), and cerebral infarction without residual deficits: Secondary | ICD-10-CM | POA: Diagnosis not present

## 2018-05-31 DIAGNOSIS — I5032 Chronic diastolic (congestive) heart failure: Secondary | ICD-10-CM | POA: Diagnosis not present

## 2018-05-31 DIAGNOSIS — Z7902 Long term (current) use of antithrombotics/antiplatelets: Secondary | ICD-10-CM | POA: Diagnosis not present

## 2018-05-31 DIAGNOSIS — Z7982 Long term (current) use of aspirin: Secondary | ICD-10-CM | POA: Diagnosis not present

## 2018-05-31 DIAGNOSIS — S82851D Displaced trimalleolar fracture of right lower leg, subsequent encounter for closed fracture with routine healing: Secondary | ICD-10-CM | POA: Diagnosis not present

## 2018-05-31 DIAGNOSIS — M069 Rheumatoid arthritis, unspecified: Secondary | ICD-10-CM | POA: Diagnosis not present

## 2018-05-31 DIAGNOSIS — S82141D Displaced bicondylar fracture of right tibia, subsequent encounter for closed fracture with routine healing: Secondary | ICD-10-CM | POA: Diagnosis not present

## 2018-05-31 DIAGNOSIS — S92324D Nondisplaced fracture of second metatarsal bone, right foot, subsequent encounter for fracture with routine healing: Secondary | ICD-10-CM | POA: Diagnosis not present

## 2018-05-31 DIAGNOSIS — E663 Overweight: Secondary | ICD-10-CM | POA: Diagnosis not present

## 2018-05-31 DIAGNOSIS — I110 Hypertensive heart disease with heart failure: Secondary | ICD-10-CM | POA: Diagnosis not present

## 2018-05-31 DIAGNOSIS — I2510 Atherosclerotic heart disease of native coronary artery without angina pectoris: Secondary | ICD-10-CM | POA: Diagnosis not present

## 2018-05-31 DIAGNOSIS — S8264XD Nondisplaced fracture of lateral malleolus of right fibula, subsequent encounter for closed fracture with routine healing: Secondary | ICD-10-CM | POA: Diagnosis not present

## 2018-05-31 DIAGNOSIS — M25061 Hemarthrosis, right knee: Secondary | ICD-10-CM | POA: Diagnosis not present

## 2018-05-31 DIAGNOSIS — Z9181 History of falling: Secondary | ICD-10-CM | POA: Diagnosis not present

## 2018-05-31 DIAGNOSIS — Z7952 Long term (current) use of systemic steroids: Secondary | ICD-10-CM | POA: Diagnosis not present

## 2018-05-31 DIAGNOSIS — Z955 Presence of coronary angioplasty implant and graft: Secondary | ICD-10-CM | POA: Diagnosis not present

## 2018-06-01 DIAGNOSIS — Z0001 Encounter for general adult medical examination with abnormal findings: Secondary | ICD-10-CM | POA: Diagnosis not present

## 2018-06-01 DIAGNOSIS — Z6837 Body mass index (BMI) 37.0-37.9, adult: Secondary | ICD-10-CM | POA: Diagnosis not present

## 2018-06-01 DIAGNOSIS — E876 Hypokalemia: Secondary | ICD-10-CM | POA: Diagnosis not present

## 2018-06-01 DIAGNOSIS — S82891F Other fracture of right lower leg, subsequent encounter for open fracture type IIIA, IIIB, or IIIC with routine healing: Secondary | ICD-10-CM | POA: Diagnosis not present

## 2018-06-01 DIAGNOSIS — S5291XD Unspecified fracture of right forearm, subsequent encounter for closed fracture with routine healing: Secondary | ICD-10-CM | POA: Diagnosis not present

## 2018-06-01 DIAGNOSIS — B009 Herpesviral infection, unspecified: Secondary | ICD-10-CM | POA: Diagnosis not present

## 2018-06-01 DIAGNOSIS — S82201F Unspecified fracture of shaft of right tibia, subsequent encounter for open fracture type IIIA, IIIB, or IIIC with routine healing: Secondary | ICD-10-CM | POA: Diagnosis not present

## 2018-06-05 DIAGNOSIS — S8264XD Nondisplaced fracture of lateral malleolus of right fibula, subsequent encounter for closed fracture with routine healing: Secondary | ICD-10-CM | POA: Diagnosis not present

## 2018-06-05 DIAGNOSIS — M25061 Hemarthrosis, right knee: Secondary | ICD-10-CM | POA: Diagnosis not present

## 2018-06-05 DIAGNOSIS — S82851D Displaced trimalleolar fracture of right lower leg, subsequent encounter for closed fracture with routine healing: Secondary | ICD-10-CM | POA: Diagnosis not present

## 2018-06-05 DIAGNOSIS — S92324D Nondisplaced fracture of second metatarsal bone, right foot, subsequent encounter for fracture with routine healing: Secondary | ICD-10-CM | POA: Diagnosis not present

## 2018-06-05 DIAGNOSIS — S82141D Displaced bicondylar fracture of right tibia, subsequent encounter for closed fracture with routine healing: Secondary | ICD-10-CM | POA: Diagnosis not present

## 2018-06-05 DIAGNOSIS — S52571D Other intraarticular fracture of lower end of right radius, subsequent encounter for closed fracture with routine healing: Secondary | ICD-10-CM | POA: Diagnosis not present

## 2018-06-06 ENCOUNTER — Encounter (HOSPITAL_COMMUNITY): Payer: Self-pay

## 2018-06-06 DIAGNOSIS — S82141D Displaced bicondylar fracture of right tibia, subsequent encounter for closed fracture with routine healing: Secondary | ICD-10-CM | POA: Diagnosis not present

## 2018-06-06 DIAGNOSIS — S82851D Displaced trimalleolar fracture of right lower leg, subsequent encounter for closed fracture with routine healing: Secondary | ICD-10-CM | POA: Diagnosis not present

## 2018-06-06 DIAGNOSIS — S93431D Sprain of tibiofibular ligament of right ankle, subsequent encounter: Secondary | ICD-10-CM | POA: Diagnosis not present

## 2018-06-06 DIAGNOSIS — S52501D Unspecified fracture of the lower end of right radius, subsequent encounter for closed fracture with routine healing: Secondary | ICD-10-CM | POA: Diagnosis not present

## 2018-06-07 DIAGNOSIS — S82141D Displaced bicondylar fracture of right tibia, subsequent encounter for closed fracture with routine healing: Secondary | ICD-10-CM | POA: Diagnosis not present

## 2018-06-07 DIAGNOSIS — S82851D Displaced trimalleolar fracture of right lower leg, subsequent encounter for closed fracture with routine healing: Secondary | ICD-10-CM | POA: Diagnosis not present

## 2018-06-07 DIAGNOSIS — S52571D Other intraarticular fracture of lower end of right radius, subsequent encounter for closed fracture with routine healing: Secondary | ICD-10-CM | POA: Diagnosis not present

## 2018-06-07 DIAGNOSIS — M25061 Hemarthrosis, right knee: Secondary | ICD-10-CM | POA: Diagnosis not present

## 2018-06-07 DIAGNOSIS — S8264XD Nondisplaced fracture of lateral malleolus of right fibula, subsequent encounter for closed fracture with routine healing: Secondary | ICD-10-CM | POA: Diagnosis not present

## 2018-06-07 DIAGNOSIS — S92324D Nondisplaced fracture of second metatarsal bone, right foot, subsequent encounter for fracture with routine healing: Secondary | ICD-10-CM | POA: Diagnosis not present

## 2018-06-09 DIAGNOSIS — S82851D Displaced trimalleolar fracture of right lower leg, subsequent encounter for closed fracture with routine healing: Secondary | ICD-10-CM | POA: Diagnosis not present

## 2018-06-09 DIAGNOSIS — S92324D Nondisplaced fracture of second metatarsal bone, right foot, subsequent encounter for fracture with routine healing: Secondary | ICD-10-CM | POA: Diagnosis not present

## 2018-06-09 DIAGNOSIS — S8264XD Nondisplaced fracture of lateral malleolus of right fibula, subsequent encounter for closed fracture with routine healing: Secondary | ICD-10-CM | POA: Diagnosis not present

## 2018-06-09 DIAGNOSIS — S52571D Other intraarticular fracture of lower end of right radius, subsequent encounter for closed fracture with routine healing: Secondary | ICD-10-CM | POA: Diagnosis not present

## 2018-06-09 DIAGNOSIS — M25061 Hemarthrosis, right knee: Secondary | ICD-10-CM | POA: Diagnosis not present

## 2018-06-09 DIAGNOSIS — S82141D Displaced bicondylar fracture of right tibia, subsequent encounter for closed fracture with routine healing: Secondary | ICD-10-CM | POA: Diagnosis not present

## 2018-06-13 DIAGNOSIS — S52571D Other intraarticular fracture of lower end of right radius, subsequent encounter for closed fracture with routine healing: Secondary | ICD-10-CM | POA: Diagnosis not present

## 2018-06-13 DIAGNOSIS — M25061 Hemarthrosis, right knee: Secondary | ICD-10-CM | POA: Diagnosis not present

## 2018-06-13 DIAGNOSIS — S82141D Displaced bicondylar fracture of right tibia, subsequent encounter for closed fracture with routine healing: Secondary | ICD-10-CM | POA: Diagnosis not present

## 2018-06-13 DIAGNOSIS — S8264XD Nondisplaced fracture of lateral malleolus of right fibula, subsequent encounter for closed fracture with routine healing: Secondary | ICD-10-CM | POA: Diagnosis not present

## 2018-06-13 DIAGNOSIS — S92324D Nondisplaced fracture of second metatarsal bone, right foot, subsequent encounter for fracture with routine healing: Secondary | ICD-10-CM | POA: Diagnosis not present

## 2018-06-13 DIAGNOSIS — S82851D Displaced trimalleolar fracture of right lower leg, subsequent encounter for closed fracture with routine healing: Secondary | ICD-10-CM | POA: Diagnosis not present

## 2018-06-15 DIAGNOSIS — M25061 Hemarthrosis, right knee: Secondary | ICD-10-CM | POA: Diagnosis not present

## 2018-06-15 DIAGNOSIS — S52571D Other intraarticular fracture of lower end of right radius, subsequent encounter for closed fracture with routine healing: Secondary | ICD-10-CM | POA: Diagnosis not present

## 2018-06-15 DIAGNOSIS — S8264XD Nondisplaced fracture of lateral malleolus of right fibula, subsequent encounter for closed fracture with routine healing: Secondary | ICD-10-CM | POA: Diagnosis not present

## 2018-06-15 DIAGNOSIS — S82851D Displaced trimalleolar fracture of right lower leg, subsequent encounter for closed fracture with routine healing: Secondary | ICD-10-CM | POA: Diagnosis not present

## 2018-06-15 DIAGNOSIS — S92324D Nondisplaced fracture of second metatarsal bone, right foot, subsequent encounter for fracture with routine healing: Secondary | ICD-10-CM | POA: Diagnosis not present

## 2018-06-15 DIAGNOSIS — S82141D Displaced bicondylar fracture of right tibia, subsequent encounter for closed fracture with routine healing: Secondary | ICD-10-CM | POA: Diagnosis not present

## 2018-06-26 ENCOUNTER — Ambulatory Visit: Payer: Medicare Other | Admitting: Cardiology

## 2018-06-26 DIAGNOSIS — S82141D Displaced bicondylar fracture of right tibia, subsequent encounter for closed fracture with routine healing: Secondary | ICD-10-CM | POA: Diagnosis not present

## 2018-06-26 DIAGNOSIS — S93431D Sprain of tibiofibular ligament of right ankle, subsequent encounter: Secondary | ICD-10-CM | POA: Diagnosis not present

## 2018-06-26 DIAGNOSIS — S82851D Displaced trimalleolar fracture of right lower leg, subsequent encounter for closed fracture with routine healing: Secondary | ICD-10-CM | POA: Diagnosis not present

## 2018-06-26 DIAGNOSIS — S52501D Unspecified fracture of the lower end of right radius, subsequent encounter for closed fracture with routine healing: Secondary | ICD-10-CM | POA: Diagnosis not present

## 2018-07-03 DIAGNOSIS — S82851D Displaced trimalleolar fracture of right lower leg, subsequent encounter for closed fracture with routine healing: Secondary | ICD-10-CM | POA: Diagnosis not present

## 2018-07-03 DIAGNOSIS — S82141D Displaced bicondylar fracture of right tibia, subsequent encounter for closed fracture with routine healing: Secondary | ICD-10-CM | POA: Diagnosis not present

## 2018-07-03 DIAGNOSIS — S8264XD Nondisplaced fracture of lateral malleolus of right fibula, subsequent encounter for closed fracture with routine healing: Secondary | ICD-10-CM | POA: Diagnosis not present

## 2018-07-03 DIAGNOSIS — M25061 Hemarthrosis, right knee: Secondary | ICD-10-CM | POA: Diagnosis not present

## 2018-07-03 DIAGNOSIS — S52571D Other intraarticular fracture of lower end of right radius, subsequent encounter for closed fracture with routine healing: Secondary | ICD-10-CM | POA: Diagnosis not present

## 2018-07-03 DIAGNOSIS — S92324D Nondisplaced fracture of second metatarsal bone, right foot, subsequent encounter for fracture with routine healing: Secondary | ICD-10-CM | POA: Diagnosis not present

## 2018-07-13 DIAGNOSIS — S8264XD Nondisplaced fracture of lateral malleolus of right fibula, subsequent encounter for closed fracture with routine healing: Secondary | ICD-10-CM | POA: Diagnosis not present

## 2018-07-13 DIAGNOSIS — M25061 Hemarthrosis, right knee: Secondary | ICD-10-CM | POA: Diagnosis not present

## 2018-07-13 DIAGNOSIS — S52571D Other intraarticular fracture of lower end of right radius, subsequent encounter for closed fracture with routine healing: Secondary | ICD-10-CM | POA: Diagnosis not present

## 2018-07-13 DIAGNOSIS — S92324D Nondisplaced fracture of second metatarsal bone, right foot, subsequent encounter for fracture with routine healing: Secondary | ICD-10-CM | POA: Diagnosis not present

## 2018-07-13 DIAGNOSIS — S82141D Displaced bicondylar fracture of right tibia, subsequent encounter for closed fracture with routine healing: Secondary | ICD-10-CM | POA: Diagnosis not present

## 2018-07-13 DIAGNOSIS — S82851D Displaced trimalleolar fracture of right lower leg, subsequent encounter for closed fracture with routine healing: Secondary | ICD-10-CM | POA: Diagnosis not present

## 2018-08-28 DIAGNOSIS — S82851D Displaced trimalleolar fracture of right lower leg, subsequent encounter for closed fracture with routine healing: Secondary | ICD-10-CM | POA: Diagnosis not present

## 2018-08-28 DIAGNOSIS — S52501D Unspecified fracture of the lower end of right radius, subsequent encounter for closed fracture with routine healing: Secondary | ICD-10-CM | POA: Diagnosis not present

## 2018-08-28 DIAGNOSIS — S82141D Displaced bicondylar fracture of right tibia, subsequent encounter for closed fracture with routine healing: Secondary | ICD-10-CM | POA: Diagnosis not present

## 2018-08-28 DIAGNOSIS — S93431D Sprain of tibiofibular ligament of right ankle, subsequent encounter: Secondary | ICD-10-CM | POA: Diagnosis not present

## 2018-08-29 ENCOUNTER — Other Ambulatory Visit: Payer: Self-pay | Admitting: Student

## 2018-08-29 DIAGNOSIS — M25579 Pain in unspecified ankle and joints of unspecified foot: Secondary | ICD-10-CM

## 2018-09-04 ENCOUNTER — Other Ambulatory Visit: Payer: Medicare Other

## 2018-09-04 ENCOUNTER — Ambulatory Visit
Admission: RE | Admit: 2018-09-04 | Discharge: 2018-09-04 | Disposition: A | Discharge status: Home / Self Care | Payer: Medicare Other | Source: Ambulatory Visit | Attending: Student | Admitting: Student

## 2018-09-04 DIAGNOSIS — M25579 Pain in unspecified ankle and joints of unspecified foot: Secondary | ICD-10-CM

## 2018-09-04 DIAGNOSIS — M93279 Osteochondritis dissecans, unspecified ankle and joints of foot: Secondary | ICD-10-CM | POA: Diagnosis not present

## 2018-09-25 DIAGNOSIS — Z79899 Other long term (current) drug therapy: Secondary | ICD-10-CM | POA: Diagnosis not present

## 2018-09-25 DIAGNOSIS — M0579 Rheumatoid arthritis with rheumatoid factor of multiple sites without organ or systems involvement: Secondary | ICD-10-CM | POA: Diagnosis not present

## 2018-10-18 ENCOUNTER — Telehealth: Payer: Self-pay

## 2018-10-18 NOTE — Progress Notes (Signed)
Virtual Visit via Video Note   This visit type was conducted due to national recommendations for restrictions regarding the COVID-19 Pandemic (e.g. social distancing) in an effort to limit this patient's exposure and mitigate transmission in our community.  Due to her co-morbid illnesses, this patient is at least at moderate risk for complications without adequate follow up.  This format is felt to be most appropriate for this patient at this time.  All issues noted in this document were discussed and addressed.  A limited physical exam was performed with this format.  Please refer to the patient's chart for her consent to telehealth for [facility].   Evaluation Performed:  Follow-up visit  Date:  10/24/2018   ID:  Cindy Holmes, DOB 27-Sep-1950, MRN [id number]  Patient Location: Home  Provider Location: Home  PCP:  Redmond School, MD  Cardiologist:  Ena Dawley, MD  Electrophysiologist:  None   Chief Complaint:  Follow up from missed stress test  History of Present Illness:    Cindy Holmes is a 68 y.o. female who presents via audio/video conferencing for a telehealth visit today.    This is a 69 year old female withhistory of CAD status post BMS to the proximal mid LAD and PTCA of diagonal 1 and 2001, PTCA of ostial diagonal 1 and 2002, last cath 2017 moderate in-stent restenosis of the proximal LAD stent, mild disease in the ostial diagonal 1 and mid distal LAD stent normal LV function. 2D echo 2018 normal LVEF 65 to 70% with grade 2 DD. Hypertension, HLD, chronic diastolic CHF, morbid obesity, question of prior stroke/TIA.  Renal artery Dopplers were normal 01/2017.  I saw the patient 02/21/2018 at which time she had just been diagnosed with lupus.  Was complaining on dyspnea on exertion and was requesting a nuclear stress test because she has them done every 2 years.  Unfortunately patient suffered a fall 05/2018 with multiple fractures of her right radius, tibial  plateau fracture on the right and was transferred to [facility] and then a rehab facility.  She never had her NST done.  Patient still recovering from all her injuries. Using a scooter to get around. Denies chest pain, dyspnea, dyspnea on exertion, dizziness or presyncope. Younger brother has had strokes.  The patient does not have symptoms concerning for COVID-19 infection (fever, chills, cough, or new shortness of breath).    Past Medical History:  Diagnosis Date  . CAD (coronary artery disease)    a. PTCA LAD 2001/cath 2002-prox and mid LAD stents patent, PTCA ostium Dx 1 b. cath 03/2016: patent LAD stent with less than 40% in-stent restenosis, 10-30% stenosis of D1 and distal LAD with continued medical management recommended.  . Candida esophagitis ([place])   . Carotid artery disease ([place])    Doppler September, 2011, 0-39% bilateral disease mild  . Chronic diastolic CHF (congestive heart failure) ([place])   . Edema   . Emotional stress reaction    8/11  . HTN (hypertension)   . Hyperlipidemia   . Leg pain    July, 2012, Arterial Dopplers March 08, 2011 normal  . 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 PONV (postoperative nausea and vomiting)   . Rheumatoid arthritis ([place])   . Right rotator cuff tear 11/16/2013  . Stroke [facility])    mini stroke in past ???   Past Surgical History:  Procedure  Laterality Date  . CARDIAC CATHETERIZATION     with stent placement in 2001  . CARDIAC CATHETERIZATION N/A 03/12/2016   Procedure: Left Heart Cath and Coronary Angiography;  Surgeon: Nelva Bush, MD;  Location: [place] CV LAB;  Service: Cardiovascular;  Laterality: N/A;  . CARDIAC CATHETERIZATION N/A 03/12/2016   Procedure: Intravascular Pressure Wire/FFR Study;  Surgeon: Nelva Bush, MD;  Location: [place] CV LAB;  Service: Cardiovascular;  Laterality: N/A;  .  CHOLECYSTECTOMY    . CORONARY ANGIOPLASTY     2002  . ESOPHAGOGASTRODUODENOSCOPY N/A 12/03/2016   Procedure: ESOPHAGOGASTRODUODENOSCOPY (EGD);  Surgeon: Carol Ada, MD;  Location: [place];  Service: Endoscopy;  Laterality: N/A;  . laproscopic adjustable gastric  banding     with APS standard system.   . OPEN REDUCTION INTERNAL FIXATION (ORIF) DISTAL RADIAL FRACTURE Right 05/15/2018   Procedure: OPEN REDUCTION INTERNAL FIXATION (ORIF) DISTAL RADIAL FRACTURE;  Surgeon: Shona Needles, MD;  Location: [place];  Service: Orthopedics;  Laterality: Right;  . ORIF ANKLE FRACTURE Right 05/15/2018   Procedure: OPEN REDUCTION INTERNAL FIXATION (ORIF) ANKLE FRACTURE;  Surgeon: Shona Needles, MD;  Location: [place];  Service: Orthopedics;  Laterality: Right;  . ORIF TIBIA PLATEAU Right 05/15/2018   Procedure: OPEN REDUCTION INTERNAL FIXATION (ORIF) TIBIAL PLATEAU;  Surgeon: Shona Needles, MD;  Location: [place];  Service: Orthopedics;  Laterality: Right;  . Pt has 3 stents     sept 2001  . SHOULDER ARTHROSCOPY WITH SUBACROMIAL DECOMPRESSION, ROTATOR CUFF REPAIR AND BICEP TENDON REPAIR Right 11/16/2013   Procedure: RIGHT SHOULDER ARTHROSCOPY, EXTENSIVE DEBRIDEMENT, ROTATOR CUFF REPAIR ;  Surgeon: [name], MD;  Location: [place];  Service: Orthopedics;  Laterality: Right;  . TUBAL LIGATION       Current Meds  Medication Sig  . acetaminophen (TYLENOL) 325 MG tablet Take 2 tablets (650 mg total) by mouth every 6 (six) hours.  Marland Kitchen allopurinol (ZYLOPRIM) 100 MG tablet Take 400 mg by mouth daily.  Marland Kitchen aspirin 81 MG tablet Take 81 mg by mouth daily.    Marland Kitchen atorvastatin (LIPITOR) 40 MG tablet Take 2 tablets (80 mg total) by mouth daily at 6 PM.  . clopidogrel (PLAVIX) 75 MG tablet Take 1 tablet (75 mg total) by mouth daily.  Marland Kitchen ezetimibe (ZETIA) 10 MG tablet Take 1 tablet (10 mg total) by mouth daily.  . ferrous sulfate 325 (65 FE) MG tablet Take 1 tablet (325 mg total) by mouth 3 (three)  times daily with meals.  . folic acid (FOLVITE) 1 MG tablet Take 1 mg by mouth daily. Takes with chemo medication.  . furosemide (LASIX) 40 MG tablet TAKE 1 TABLET BY MOUTH  DAILY AND MAY TAKE AN  ADDITIONAL 1 TABLET DAILY  AS NEEDED FOR SWELLING.  . gabapentin (NEURONTIN) 300 MG capsule Take 1 capsule (300 mg total) by mouth 3 (three) times daily.  Marland Kitchen HYDROmorphone (DILAUDID) 2 MG tablet Take 1 tablet (2 mg total) by mouth every 3 (three) hours as needed for severe pain.  Marland Kitchen levocetirizine (XYZAL) 5 MG tablet Take 1 tablet by mouth daily.  . Melatonin 3 MG TABS Take 3 mg by mouth at bedtime as needed.  . metoprolol tartrate (LOPRESSOR) 50 MG tablet TAKE 1 TABLET BY MOUTH ONCE DAILY  . nitroGLYCERIN (NITROSTAT) 0.4 MG SL tablet DISSOLVE 1 TABLET UNDER THE TONGUE EVERY 5 MINUTES AS  NEEDED (NOT TO EXCEED 3  TABLETS IN 15 MINS, CALL  911 IF CHEST PAIN PERSISTS  .  Omega-3 Fatty Acids (FISH OIL) 1000 MG CAPS Take 1 capsule by mouth daily.   . pantoprazole (PROTONIX) 40 MG tablet Take 40 mg by mouth daily.  . predniSONE (DELTASONE) 20 MG tablet Take 1 tablet (20 mg total) by mouth daily with breakfast.  . ramipril (ALTACE) 2.5 MG capsule Take 1 capsule (2.5 mg total) by mouth daily.  Marland Kitchen senna-docusate (SENOKOT-S) 8.6-50 MG tablet Take 1 tablet by mouth daily as needed for mild constipation.     Allergies:   Codeine phosphate; Amoxicillin; Penicillins; Morphine and related; and Lidocaine   Social History   Tobacco Use  . Smoking status: Never Smoker  . Smokeless tobacco: Never Used  Substance Use Topics  . Alcohol use: No    Alcohol/week: 0.0 standard drinks  . Drug use: No     Family Hx: The patient's family history includes Arthritis/Rheumatoid in her father; Cancer in her unknown relative; Cirrhosis in her father; Colon cancer in her maternal grandmother; Diabetes in her brother and sister; Heart attack in her maternal aunt, maternal uncle, mother, and unknown relative; Heart disease in her  brother, brother, maternal aunt, and mother; Throat cancer in her maternal aunt and maternal grandfather.  ROS:   Please see the history of present illness.    Review of Systems  Constitution: Negative.  HENT: Negative.   Eyes: Negative.   Cardiovascular: Positive for leg swelling.  Respiratory: Negative.   Hematologic/Lymphatic: Negative.   Musculoskeletal: Positive for arthritis, joint pain, joint swelling, muscle weakness and stiffness.  Gastrointestinal: Negative.   Genitourinary: Negative.   Neurological: Negative.     All other systems reviewed and are negative.   Prior CV studies:   The following studies were reviewed today:  Cardiac catheterization 2017.  Moderate in-stent restenosis of proximal LAD stent, which is not hemodynamically significant (FFR 0.84). 2.  Mild disease involving ostial D1 and mid/distal LAD stent. 3.  Normal LV contraction with upper normal LV filling pressure.   Plan: 1. Continue medical management and aggressive risk factor modification   2D echo 5/2018Study Conclusions   - Left ventricle: The cavity size was normal. Systolic function was   vigorous. The estimated ejection fraction was in the range of 65%   to 70%. Wall motion was normal; there were no regional wall   motion abnormalities. Features are consistent with a pseudonormal   left ventricular filling pattern, with concomitant abnormal   relaxation and increased filling pressure (grade 2 diastolic   dysfunction). - Left atrium: The atrium was mildly dilated.   Impressions:   - Technically difficult; definity used; vigorous LV systolic   function; moderate diastolic dysfunction; mild LAE.     Renal duplex 01/27/2017 Impressions Normal caliber abdominal aorta. The left kidney measures 1.8 cm smaller than the right.  Normal renal arteries, bilaterally. The IVC and renal veins are patent   Carotid Dopplers 01/2015 Impressions Heterogeneous plaque, bilaterally. Stable 1-39%  bilateral ICA stenosis. Normal subclavian arteries, bilaterally. Patent vertebral arteries with antegrade flow. f/u 2 years   Labs/Other Tests and Data Reviewed:    EKG:  An ECG dated 05/15/18 was personally reviewed today and demonstrated:  sinus tachycardia at 103/m with PAC  Recent Labs: 05/15/2018: Magnesium 2.1 05/22/2018: ALT 45 05/30/2018: BUN 12; Creatinine, Ser 0.59; Hemoglobin 11.3; Platelets 449; Potassium 3.1; Sodium 138   Recent Lipid Panel Lab Results  Component Value Date/Time   CHOL 164 09/30/2016 08:40 AM   TRIG 110 09/30/2016 08:40 AM   HDL 74 09/30/2016 08:40 AM  CHOLHDL 2.2 09/30/2016 08:40 AM   CHOLHDL 2.3 03/12/2016 02:52 AM   LDLCALC 68 09/30/2016 08:40 AM    Wt Readings from Last 3 Encounters:  05/14/18 221 lb 1.9 oz (100.3 kg)  02/21/18 207 lb (93.9 kg)  01/11/17 231 lb 6.4 oz (105 kg)     Objective:    Vital Signs:  BP 128/72   Pulse 95   Ht 5\' 3"  (1.6 m)   BMI 39.17 kg/m    Well nourished, well developed female in no acute distress. No JVD. Right ankle swollen from recent surgery   ASSESSMENT & PLAN:    1. CAD status post multiple PCI's in the past, last cath in 2017 with moderate in-stent restenosis of the proximal LAD not hemodynamically significant, managed medically.  Patient has chronic dyspnea on exertion and has nuclear stress test every 2 years.  She missed it last year because of a fall and multiple fractures. She hasn't been able to do much as she's using a scooter but is not having any new symptoms of angina. She is anxious to have NST done-will schedule once office opens back up  Chronic diastolic CHF last echo 3382 normal LVEF with grade 2 DD. No evidence of CHF. Takes extra lasix once a week  Essential hypertension well controlled  Hyperlipidemia LDL 68 in 2018 on Zetia and Lipitor-needs FLP   Obesity-unable to exercise at this time  Bilateral carotid artery stenosis 1 to 39% in 2016 will need repeat Dopplers once office  opens  COVID-19 Education: The signs and symptoms of COVID-19 were discussed with the patient and how to seek care for testing (follow up with PCP or arrange E-visit).  The importance of social distancing was discussed today.  Time:   Today, I have spent 15 minutes with the patient with telehealth technology discussing the above problems.     Medication Adjustments/Labs and Tests Ordered: Current medicines are reviewed at length with the patient today.  Concerns regarding medicines are outlined above.  Tests Ordered: Orders Placed This Encounter  Procedures  . Lipid panel  . MYOCARDIAL PERFUSION IMAGING   Medication Changes: No orders of the defined types were placed in this encounter.   Disposition:  Follow up in 6 month(s) Dr. Meda Coffee  Signed, Ermalinda Barrios, PA-C  10/24/2018 8:37 AM    [place]

## 2018-10-18 NOTE — Telephone Encounter (Signed)
[employer]     Virtual Visit Pre-Appointment Phone Call  Steps For Call:  1. Confirm consent - "In the setting of the current Covid19 crisis, you are scheduled for a (phone or video) visit with your provider on (date) at (time).  Just as we do with many in-office visits, in order for you to participate in this visit, we must obtain consent.  If you'd like, I can send this to your mychart (if signed up) or email for you to review.  Otherwise, I can obtain your verbal consent now.  All virtual visits are billed to your insurance company just like a normal visit would be.  By agreeing to a virtual visit, we'd like you to understand that the technology does not allow for your provider to perform an examination, and thus may limit your provider's ability to fully assess your condition.  Finally, though the technology is pretty good, we cannot assure that it will always work on either your or our end, and in the setting of a video visit, we may have to convert it to a phone-only visit.  In either situation, we cannot ensure that we have a secure connection.  Are you willing to proceed?"  2. Give patient instructions for WebEx download to smartphone as below if video visit  3. Advise patient to be prepared with any vital sign or heart rhythm information, their current medicines, and a piece of paper and pen handy for any instructions they may receive the day of their visit  4. Inform patient they will receive a phone call 15 minutes prior to their appointment time (may be from unknown caller ID) so they should be prepared to answer  5. Confirm that appointment type is correct in Epic appointment notes (video vs telephone)    TELEPHONE CALL NOTE  Cindy Holmes has been deemed a candidate for a follow-up tele-health visit to limit community exposure during the Covid-19 pandemic. I spoke with the patient via phone to ensure availability of phone/video source, confirm preferred email & phone number, and  discuss instructions and expectations.  I reminded Cindy Holmes to be prepared with any vital sign and/or heart rhythm information that could potentially be obtained via home monitoring, at the time of her visit. I reminded Cindy Holmes to expect a phone call at the time of her visit if her visit.  Did the patient verbally acknowledge consent to treatment? yes  Gar Ponto, [facility] 10/18/2018 2:31 PM   DOWNLOADING THE [place], go to [employer] and type in WebEx in the search bar. [place] [employer], the blue/green circle. The app is free but as with any other app downloads, their phone may require them to verify saved payment information or Apple password. The patient does NOT have to create an account.  - If Android, ask patient to go to [employer] and type in WebEx in the search bar. [place] [employer], the blue/green circle. The app is free but as with any other app downloads, their phone may require them to verify saved payment information or Android password. The patient does NOT have to create an account.   CONSENT FOR TELE-HEALTH VISIT - PLEASE REVIEW  I hereby voluntarily request, consent and authorize [facility] and its employed or contracted physicians, physician assistants, nurse practitioners or other licensed health care professionals (the Practitioner), to provide me with telemedicine health care services (the "Services") as deemed necessary by  the treating Practitioner. I acknowledge and consent to receive the Services by the Practitioner via telemedicine. I understand that the telemedicine visit will involve communicating with the Practitioner through live audiovisual communication technology and the disclosure of certain medical information by electronic transmission. I acknowledge that I have been given the opportunity to request an in-person assessment or other available alternative prior to the telemedicine  visit and am voluntarily participating in the telemedicine visit.  I understand that I have the right to withhold or withdraw my consent to the use of telemedicine in the course of my care at any time, without affecting my right to future care or treatment, and that the Practitioner or I may terminate the telemedicine visit at any time. I understand that I have the right to inspect all information obtained and/or recorded in the course of the telemedicine visit and may receive copies of available information for a reasonable fee.  I understand that some of the potential risks of receiving the Services via telemedicine include:  Marland Kitchen Delay or interruption in medical evaluation due to technological equipment failure or disruption; . Information transmitted may not be sufficient (e.g. poor resolution of images) to allow for appropriate medical decision making by the Practitioner; and/or  . In rare instances, security protocols could fail, causing a breach of personal health information.  Furthermore, I acknowledge that it is my responsibility to provide information about my medical history, conditions and care that is complete and accurate to the best of my ability. I acknowledge that Practitioner's advice, recommendations, and/or decision may be based on factors not within their control, such as incomplete or inaccurate data provided by me or distortions of diagnostic images or specimens that may result from electronic transmissions. I understand that the practice of medicine is not an exact science and that Practitioner makes no warranties or guarantees regarding treatment outcomes. I acknowledge that I will receive a copy of this consent concurrently upon execution via email to the email address I last provided but may also request a printed copy by calling the office of [facility].    I understand that my insurance will be billed for this visit.   I have read or had this consent read to me. . I  understand the contents of this consent, which adequately explains the benefits and risks of the Services being provided via telemedicine.  . I have been provided ample opportunity to ask questions regarding this consent and the Services and have had my questions answered to my satisfaction. . I give my informed consent for the services to be provided through the use of telemedicine in my medical care  By participating in this telemedicine visit I agree to the above.

## 2018-10-24 ENCOUNTER — Other Ambulatory Visit: Payer: Self-pay

## 2018-10-24 ENCOUNTER — Telehealth (INDEPENDENT_AMBULATORY_CARE_PROVIDER_SITE_OTHER): Payer: Medicare Other | Admitting: Physician Assistant

## 2018-10-24 ENCOUNTER — Encounter: Payer: Self-pay | Admitting: Physician Assistant

## 2018-10-24 VITALS — BP 128/72 | HR 95 | Ht 63.0 in

## 2018-10-24 DIAGNOSIS — I10 Essential (primary) hypertension: Secondary | ICD-10-CM | POA: Diagnosis not present

## 2018-10-24 DIAGNOSIS — E663 Overweight: Secondary | ICD-10-CM

## 2018-10-24 DIAGNOSIS — E782 Mixed hyperlipidemia: Secondary | ICD-10-CM

## 2018-10-24 DIAGNOSIS — I5032 Chronic diastolic (congestive) heart failure: Secondary | ICD-10-CM

## 2018-10-24 DIAGNOSIS — I2510 Atherosclerotic heart disease of native coronary artery without angina pectoris: Secondary | ICD-10-CM | POA: Diagnosis not present

## 2018-10-24 DIAGNOSIS — I6523 Occlusion and stenosis of bilateral carotid arteries: Secondary | ICD-10-CM | POA: Diagnosis not present

## 2018-10-24 NOTE — Patient Instructions (Signed)
Medication Instructions:  Your physician recommends that you continue on your current medications as directed. Please refer to the Current Medication list given to you today.  If you need a refill on your cardiac medications before your next appointment, please call your pharmacy.   Lab work: Your physician recommends that you return for a FASTING lipid profile   If you have labs (blood work) drawn today and your tests are completely normal, you will receive your results only by: Marland Kitchen MyChart Message (if you have MyChart) OR . A paper copy in the mail If you have any lab test that is abnormal or we need to change your treatment, we will call you to review the results.  Testing/Procedures: Your physician has requested that you have a carotid duplex. This test is an ultrasound of the carotid arteries in your neck. It looks at blood flow through these arteries that supply the brain with blood. Allow one hour for this exam. There are no restrictions or special instructions.  Your physician has requested that you have a lexiscan myoview. For further information please visit [email or web address]. Please follow instruction sheet, as given.  Follow-Up: At [facility], you and your health needs are our priority.  As part of our continuing mission to provide you with exceptional heart care, we have created designated Provider Care Teams.  These Care Teams include your primary Cardiologist (physician) and Advanced Practice Providers (APPs -  Physician Assistants and Nurse Practitioners) who all work together to provide you with the care you need, when you need it. . You will need a follow up appointment in 6 months.  Please call our office 2 months in advance to schedule this appointment.  You may see Ena Dawley, MD or one of the following Advanced Practice Providers on your designated Care Team:   . Lyda Jester, PA-C . Dayna Dunn, PA-C . Ermalinda Barrios, PA-C  Any Other Special Instructions  Will Be Listed Below (If Applicable).

## 2018-10-30 DIAGNOSIS — R768 Other specified abnormal immunological findings in serum: Secondary | ICD-10-CM | POA: Diagnosis not present

## 2018-10-30 DIAGNOSIS — M109 Gout, unspecified: Secondary | ICD-10-CM | POA: Diagnosis not present

## 2018-10-30 DIAGNOSIS — M0579 Rheumatoid arthritis with rheumatoid factor of multiple sites without organ or systems involvement: Secondary | ICD-10-CM | POA: Diagnosis not present

## 2018-10-30 DIAGNOSIS — Z79899 Other long term (current) drug therapy: Secondary | ICD-10-CM | POA: Diagnosis not present

## 2018-12-11 ENCOUNTER — Other Ambulatory Visit: Payer: Self-pay

## 2018-12-11 ENCOUNTER — Encounter (HOSPITAL_COMMUNITY): Payer: Self-pay | Admitting: *Deleted

## 2018-12-11 ENCOUNTER — Emergency Department (HOSPITAL_COMMUNITY)
Admission: EM | Admit: 2018-12-11 | Discharge: 2018-12-11 | Disposition: A | Discharge status: Home / Self Care | Payer: Medicare Other | Attending: Emergency Medicine | Admitting: Emergency Medicine

## 2018-12-11 ENCOUNTER — Emergency Department (HOSPITAL_COMMUNITY): Payer: Medicare Other

## 2018-12-11 DIAGNOSIS — M069 Rheumatoid arthritis, unspecified: Secondary | ICD-10-CM | POA: Diagnosis not present

## 2018-12-11 DIAGNOSIS — Z79899 Other long term (current) drug therapy: Secondary | ICD-10-CM | POA: Diagnosis not present

## 2018-12-11 DIAGNOSIS — R519 Headache, unspecified: Secondary | ICD-10-CM

## 2018-12-11 DIAGNOSIS — I2510 Atherosclerotic heart disease of native coronary artery without angina pectoris: Secondary | ICD-10-CM | POA: Diagnosis not present

## 2018-12-11 DIAGNOSIS — Z7982 Long term (current) use of aspirin: Secondary | ICD-10-CM | POA: Insufficient documentation

## 2018-12-11 DIAGNOSIS — R51 Headache: Secondary | ICD-10-CM | POA: Diagnosis not present

## 2018-12-11 DIAGNOSIS — M3210 Systemic lupus erythematosus, organ or system involvement unspecified: Secondary | ICD-10-CM | POA: Diagnosis not present

## 2018-12-11 DIAGNOSIS — I5032 Chronic diastolic (congestive) heart failure: Secondary | ICD-10-CM | POA: Diagnosis not present

## 2018-12-11 DIAGNOSIS — R509 Fever, unspecified: Secondary | ICD-10-CM | POA: Insufficient documentation

## 2018-12-11 DIAGNOSIS — I110 Hypertensive heart disease with heart failure: Secondary | ICD-10-CM | POA: Diagnosis not present

## 2018-12-11 DIAGNOSIS — Z7901 Long term (current) use of anticoagulants: Secondary | ICD-10-CM | POA: Diagnosis not present

## 2018-12-11 DIAGNOSIS — R05 Cough: Secondary | ICD-10-CM | POA: Diagnosis not present

## 2018-12-11 DIAGNOSIS — R300 Dysuria: Secondary | ICD-10-CM | POA: Insufficient documentation

## 2018-12-11 DIAGNOSIS — Z20828 Contact with and (suspected) exposure to other viral communicable diseases: Secondary | ICD-10-CM | POA: Insufficient documentation

## 2018-12-11 LAB — CBC WITH DIFFERENTIAL/PLATELET
Abs Immature Granulocytes: 0.07 10*3/uL (ref 0.00–0.07)
Basophils Absolute: 0 10*3/uL (ref 0.0–0.1)
Basophils Relative: 0 %
Eosinophils Absolute: 0 10*3/uL (ref 0.0–0.5)
Eosinophils Relative: 0 %
HCT: 35.7 % — ABNORMAL LOW (ref 36.0–46.0)
Hemoglobin: 11.3 g/dL — ABNORMAL LOW (ref 12.0–15.0)
Immature Granulocytes: 1 %
Lymphocytes Relative: 3 %
Lymphs Abs: 0.4 10*3/uL — ABNORMAL LOW (ref 0.7–4.0)
MCH: 29.4 pg (ref 26.0–34.0)
MCHC: 31.7 g/dL (ref 30.0–36.0)
MCV: 93 fL (ref 80.0–100.0)
Monocytes Absolute: 0.5 10*3/uL (ref 0.1–1.0)
Monocytes Relative: 4 %
Neutro Abs: 10.4 10*3/uL — ABNORMAL HIGH (ref 1.7–7.7)
Neutrophils Relative %: 92 %
Platelets: 238 10*3/uL (ref 150–400)
RBC: 3.84 MIL/uL — ABNORMAL LOW (ref 3.87–5.11)
RDW: 15 % (ref 11.5–15.5)
WBC: 11.4 10*3/uL — ABNORMAL HIGH (ref 4.0–10.5)
nRBC: 0 % (ref 0.0–0.2)

## 2018-12-11 LAB — URINALYSIS, ROUTINE W REFLEX MICROSCOPIC
Bilirubin Urine: NEGATIVE
Glucose, UA: NEGATIVE mg/dL
Hgb urine dipstick: NEGATIVE
Ketones, ur: NEGATIVE mg/dL
Leukocytes,Ua: NEGATIVE
Nitrite: NEGATIVE
Protein, ur: NEGATIVE mg/dL
Specific Gravity, Urine: 1.034 — ABNORMAL HIGH (ref 1.005–1.030)
pH: 5 (ref 5.0–8.0)

## 2018-12-11 LAB — COMPREHENSIVE METABOLIC PANEL
ALT: 24 U/L (ref 0–44)
AST: 17 U/L (ref 15–41)
Albumin: 3.2 g/dL — ABNORMAL LOW (ref 3.5–5.0)
Alkaline Phosphatase: 94 U/L (ref 38–126)
Anion gap: 11 (ref 5–15)
BUN: 10 mg/dL (ref 8–23)
CO2: 27 mmol/L (ref 22–32)
Calcium: 9 mg/dL (ref 8.9–10.3)
Chloride: 97 mmol/L — ABNORMAL LOW (ref 98–111)
Creatinine, Ser: 0.74 mg/dL (ref 0.44–1.00)
GFR calc Af Amer: >60 mL/min (ref >60)
GFR calc non Af Amer: >60 mL/min (ref >60)
Glucose, Bld: 144 mg/dL — ABNORMAL HIGH (ref 70–99)
Potassium: 3.5 mmol/L (ref 3.5–5.1)
Sodium: 135 mmol/L (ref 135–145)
Total Bilirubin: 0.9 mg/dL (ref 0.3–1.2)
Total Protein: 6 g/dL — ABNORMAL LOW (ref 6.5–8.1)

## 2018-12-11 LAB — SARS CORONAVIRUS 2 BY RT PCR (HOSPITAL ORDER, PERFORMED IN ~~LOC~~ HOSPITAL LAB): SARS Coronavirus 2: NEGATIVE

## 2018-12-11 LAB — PREGNANCY, URINE: Preg Test, Ur: NEGATIVE

## 2018-12-11 MED ORDER — METOCLOPRAMIDE HCL 5 MG/ML IJ SOLN
10.0000 mg | Freq: Once | INTRAMUSCULAR | Status: AC
  Administered 2018-12-11: 10 mg via INTRAVENOUS
  Filled 2018-12-11: qty 2

## 2018-12-11 MED ORDER — ACETAMINOPHEN 325 MG PO TABS
650.0000 mg | ORAL_TABLET | Freq: Once | ORAL | Status: AC
  Administered 2018-12-11: 650 mg via ORAL
  Filled 2018-12-11: qty 2

## 2018-12-11 NOTE — ED Provider Notes (Signed)
[facility] EMERGENCY DEPARTMENT Provider Note   CSN: [id number] Arrival date & time: 12/11/18  1055    History   Chief Complaint Chief Complaint  Patient presents with  . Cough  . Fever    HPI Cindy Holmes is a 68 y.o. female with a PMH of CHF, Candida esophagitis, CAD, HTN, HLD, RA, and Lupus presenting with fever onset yesterday. Patient reports fever has been around 102F and 103F. Patient reports intermittent left sided gradual headache onset yesterday. Patient reports taking tylenol with partial relief. Patient states this headache is similar to previous headaches. Patient reports dysuria, but denies hematuria, urinary frequency, or flank pain. Patient reports intermittent nausea, but denies vomiting or abdominal pain. Patient reports chills. Patient denies trauma, weakness, numbness, vision changes, facial asymmetry, or dizziness. Patient denies cough, neck pain, rash, shortness of breath, chest pain, sick exposures, or recent travel.      HPI  Past Medical History:  Diagnosis Date  . CAD (coronary artery disease)    a. PTCA LAD 2001/cath 2002-prox and mid LAD stents patent, PTCA ostium Dx 1 b. cath 03/2016: patent LAD stent with less than 40% in-stent restenosis, 10-30% stenosis of D1 and distal LAD with continued medical management recommended.  . Candida esophagitis ([place])   . Carotid artery disease ([place])    Doppler September, 2011, 0-39% bilateral disease mild  . Chronic diastolic CHF (congestive heart failure) ([place])   . Edema   . Emotional stress reaction    8/11  . HTN (hypertension)   . Hyperlipidemia   . Leg pain    July, 2012, Arterial Dopplers March 08, 2011 normal  . 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 PONV (postoperative nausea and vomiting)   . Rheumatoid arthritis ([place])   . Right rotator cuff tear 11/16/2013  . Stroke  [facility])    mini stroke in past ???    Patient Active Problem List   Diagnosis Date Noted  . Closed displaced trimalleolar fracture of right ankle 05/28/2018  . Fall at home, initial encounter 05/17/2018  . Multiple fractures 05/14/2018  . Fracture of distal end of right radius 05/14/2018  . Tibial plateau fracture, right, closed, initial encounter 05/14/2018  . Candida esophagitis ([place]) 12/03/2016  . Hypokalemia 12/02/2016  . Lactic acidosis 12/02/2016  . AKI (acute kidney injury) ([place]) 12/02/2016  . Chronic diastolic CHF (congestive heart failure) ([place]) 12/02/2016  . Coronary artery disease involving native coronary artery of native heart without angina pectoris 11/13/2015  . Chest pain 11/13/2015  . Hyperlipemia 11/13/2015  . Acute on chronic diastolic CHF (congestive heart failure), NYHA class 3 ([place]) 11/13/2015  . Pain in joint, shoulder region 01/16/2014  . Muscle weakness (generalized) 01/16/2014  . Decreased range of motion of right shoulder 01/16/2014  . Right rotator cuff tear 11/16/2013  . Rotator cuff tear 11/16/2013  . Rheumatoid arthritis ([place])   . Essential hypertension   . Hyperlipidemia   . Stroke ([place])   . Emotional stress reaction   . Leg pain   . Carotid artery disease ([place])   . Overweight 11/04/2008  . EDEMA 11/04/2008    Past Surgical History:  Procedure Laterality Date  . CARDIAC CATHETERIZATION     with stent placement in 2001  . CARDIAC CATHETERIZATION N/A 03/12/2016   Procedure: Left Heart Cath and Coronary Angiography;  Surgeon: Nelva Bush, MD;  Location:  [facility] INVASIVE CV LAB;  Service: Cardiovascular;  Laterality: N/A;  . CARDIAC CATHETERIZATION N/A 03/12/2016   Procedure: Intravascular Pressure Wire/FFR Study;  Surgeon: Nelva Bush, MD;  Location: [facility] CV LAB;  Service: Cardiovascular;  Laterality: N/A;  . CHOLECYSTECTOMY    . CORONARY ANGIOPLASTY     2002  . ESOPHAGOGASTRODUODENOSCOPY N/A 12/03/2016   Procedure:  ESOPHAGOGASTRODUODENOSCOPY (EGD);  Surgeon: Carol Ada, MD;  Location: [facility];  Service: Endoscopy;  Laterality: N/A;  . laproscopic adjustable gastric  banding     with APS standard system.   . OPEN REDUCTION INTERNAL FIXATION (ORIF) DISTAL RADIAL FRACTURE Right 05/15/2018   Procedure: OPEN REDUCTION INTERNAL FIXATION (ORIF) DISTAL RADIAL FRACTURE;  Surgeon: Shona Needles, MD;  Location: [facility];  Service: Orthopedics;  Laterality: Right;  . ORIF ANKLE FRACTURE Right 05/15/2018   Procedure: OPEN REDUCTION INTERNAL FIXATION (ORIF) ANKLE FRACTURE;  Surgeon: Shona Needles, MD;  Location: [facility];  Service: Orthopedics;  Laterality: Right;  . ORIF TIBIA PLATEAU Right 05/15/2018   Procedure: OPEN REDUCTION INTERNAL FIXATION (ORIF) TIBIAL PLATEAU;  Surgeon: Shona Needles, MD;  Location: [facility];  Service: Orthopedics;  Laterality: Right;  . Pt has 3 stents     sept 2001  . SHOULDER ARTHROSCOPY WITH SUBACROMIAL DECOMPRESSION, ROTATOR CUFF REPAIR AND BICEP TENDON REPAIR Right 11/16/2013   Procedure: RIGHT SHOULDER ARTHROSCOPY, EXTENSIVE DEBRIDEMENT, ROTATOR CUFF REPAIR ;  Surgeon: [name], MD;  Location: [facility];  Service: Orthopedics;  Laterality: Right;  . TUBAL LIGATION       OB History   No obstetric history on file.      Home Medications    Prior to Admission medications   Medication Sig Start Date End Date Taking? Authorizing Provider  acetaminophen (TYLENOL) 325 MG tablet Take 2 tablets (650 mg total) by mouth every 6 (six) hours. 05/22/18   Nita Sells, MD  allopurinol (ZYLOPRIM) 100 MG tablet Take 400 mg by mouth daily.    [provider]  aspirin 81 MG tablet Take 81 mg by mouth daily.      [provider]  atorvastatin (LIPITOR) 40 MG tablet Take 2 tablets (80 mg total) by mouth daily at 6 PM. 04/11/18   Dorothy Spark, MD  clopidogrel (PLAVIX) 75 MG tablet Take 1 tablet (75 mg total) by mouth daily. 04/11/18   Dorothy Spark, MD  ezetimibe (ZETIA) 10 MG tablet Take 1 tablet (10 mg total) by mouth daily. 04/11/18   Dorothy Spark, MD  ferrous sulfate 325 (65 FE) MG tablet Take 1 tablet (325 mg total) by mouth 3 (three) times daily with meals. 05/22/18   Nita Sells, MD  folic acid (FOLVITE) 1 MG tablet Take 1 mg by mouth daily. Takes with chemo medication. 03/05/14   [provider]  furosemide (LASIX) 40 MG tablet TAKE 1 TABLET BY MOUTH  DAILY AND MAY TAKE AN  ADDITIONAL 1 TABLET DAILY  AS NEEDED FOR SWELLING. 04/11/18   Dorothy Spark, MD  gabapentin (NEURONTIN) 300 MG capsule Take 1 capsule (300 mg total) by mouth 3 (three) times daily. 05/22/18   Nita Sells, MD  HYDROmorphone (DILAUDID) 2 MG tablet Take 1 tablet (2 mg total) by mouth every 3 (three) hours as needed for severe pain. 05/22/18   Granville Lewis C, PA-C  levocetirizine (XYZAL) 5 MG tablet Take 1 tablet by mouth daily. 04/04/18   [provider]  Melatonin 3 MG TABS Take 3 mg by  mouth at bedtime as needed.    [provider]  metoprolol tartrate (LOPRESSOR) 50 MG tablet TAKE 1 TABLET BY MOUTH ONCE DAILY 04/11/18   Dorothy Spark, MD  nitroGLYCERIN (NITROSTAT) 0.4 MG SL tablet DISSOLVE 1 TABLET UNDER THE TONGUE EVERY 5 MINUTES AS  NEEDED (NOT TO EXCEED 3  TABLETS IN 15 MINS, CALL  911 IF CHEST PAIN PERSISTS 02/03/18   Dorothy Spark, MD  Omega-3 Fatty Acids (FISH OIL) 1000 MG CAPS Take 1 capsule by mouth daily.     [provider]  pantoprazole (PROTONIX) 40 MG tablet Take 40 mg by mouth daily.    [provider]  predniSONE (DELTASONE) 20 MG tablet Take 1 tablet (20 mg total) by mouth daily with breakfast. 05/22/18   Nita Sells, MD  ramipril (ALTACE) 2.5 MG capsule Take 1 capsule (2.5 mg total) by mouth daily. 04/11/18   Dorothy Spark, MD  senna-docusate (SENOKOT-S) 8.6-50 MG tablet Take 1 tablet by mouth daily as needed for mild constipation.    [provider]    Family History Family History  Problem Relation Age of Onset  . Heart attack Mother   . Heart disease Mother   . Diabetes Brother   . Heart disease Brother   . Diabetes Sister   . Arthritis/Rheumatoid Father   . Cirrhosis Father        hepatic cirrhosis  . Heart disease Maternal Aunt   . Heart attack Maternal Aunt   . Throat cancer Maternal Aunt   . Heart attack Maternal Uncle   . Heart disease Brother   . Cancer Unknown        family history  . Heart attack Unknown        family history  . Colon cancer Maternal Grandmother   . Throat cancer Maternal Grandfather     Social History Social History   Tobacco Use  . Smoking status: Never Smoker  . Smokeless tobacco: Never Used  Substance Use Topics  . Alcohol use: No    Alcohol/week: 0.0 standard drinks  . Drug use: No     Allergies   Codeine phosphate; Amoxicillin; Penicillins; Morphine and related; and Lidocaine   Review of Systems Review of Systems  Constitutional: Positive for chills, fatigue and fever. Negative for activity change, appetite change, diaphoresis and unexpected weight change.  HENT: Negative for congestion, ear pain, rhinorrhea and sore throat.   Eyes: Negative for visual disturbance.  Respiratory: Negative for cough and shortness of breath.   Cardiovascular: Negative for chest pain and leg swelling.  Gastrointestinal: Positive for nausea. Negative for abdominal pain, diarrhea and vomiting.  Endocrine: Negative for cold intolerance and heat intolerance.  Genitourinary: Positive for dysuria. Negative for flank pain, frequency and hematuria.  Musculoskeletal: Negative for arthralgias, back pain, joint swelling and neck pain.  Skin: Negative for color change, rash and wound.  Allergic/Immunologic: Positive for immunocompromised state.  Neurological: Positive for headaches. Negative for dizziness, tremors, seizures, facial asymmetry, weakness and numbness.     Physical Exam  Updated Vital Signs BP (!) 146/58 (BP Location: Right Arm)   Pulse 92   Temp 99.3 F (37.4 C) (Oral)   Resp 16   Ht 5\' 2"  (1.575 m)   Wt 99.8 kg   SpO2 98%   BMI 40.24 kg/m   Physical Exam Vitals signs and nursing note reviewed.  Constitutional:      General: She is not in acute distress.    Appearance: She is  well-developed. She is not diaphoretic.  HENT:     Head: Normocephalic and atraumatic.     Nose: Nose normal. No rhinorrhea.     Mouth/Throat:     Mouth: Mucous membranes are moist.     Pharynx: No posterior oropharyngeal erythema.  Eyes:     Extraocular Movements: Extraocular movements intact.     Conjunctiva/sclera: Conjunctivae normal.     Pupils: Pupils are equal, round, and reactive to light.  Neck:     Musculoskeletal: Normal range of motion and neck supple. No neck rigidity.  Cardiovascular:     Rate and Rhythm: Normal rate and regular rhythm.     Heart sounds: Normal heart sounds. No murmur. No friction rub. No gallop.   Pulmonary:     Effort: Pulmonary effort is normal. No respiratory distress.     Breath sounds: Normal breath sounds. No wheezing or rales.  Abdominal:     Palpations: Abdomen is soft.     Tenderness: There is no abdominal tenderness.  Musculoskeletal: Normal range of motion.  Skin:    General: Skin is warm.     Findings: No erythema or rash.  Neurological:     Mental Status: She is alert and oriented to person, place, and time.    Mental Status:  Alert, oriented, thought content appropriate, able to give a coherent history. Speech fluent without evidence of aphasia. Able to follow 2 step commands without difficulty.  Cranial Nerves:  II:  Peripheral visual fields grossly normal, pupils equal, round, reactive to light III,IV, VI: ptosis not present, extra-ocular motions intact bilaterally  V,VII: smile symmetric, facial light touch sensation equal VIII: hearing grossly normal to voice  IX,X: symmetric elevation of soft palate, uvula  elevates symmetrically  XI: bilateral shoulder shrug symmetric and strong XII: midline tongue extension without fassiculations Motor:  Normal tone. 5/5 in upper and lower extremities bilaterally including strong and equal grip strength and dorsiflexion/plantar flexion Sensory: light touch normal in all extremities.  Deep Tendon Reflexes: 2+ and symmetric in the biceps and patella Cerebellar: normal finger-to-nose with bilateral upper extremities Gait: normal gait and balance.  Negative pronator drift. Negative Romberg sign. CV: distal pulses palpable throughout    ED Treatments / Results  Labs (all labs ordered are listed, but only abnormal results are displayed) Labs Reviewed  COMPREHENSIVE METABOLIC PANEL - Abnormal; Notable for the following components:      Result Value   Chloride 97 (*)    Glucose, Bld 144 (*)    Total Protein 6.0 (*)    Albumin 3.2 (*)    All other components within normal limits  CBC WITH DIFFERENTIAL/PLATELET - Abnormal; Notable for the following components:   WBC 11.4 (*)    RBC 3.84 (*)    Hemoglobin 11.3 (*)    HCT 35.7 (*)    Neutro Abs 10.4 (*)    Lymphs Abs 0.4 (*)    All other components within normal limits  URINALYSIS, ROUTINE W REFLEX MICROSCOPIC - Abnormal; Notable for the following components:   Specific Gravity, Urine 1.034 (*)    All other components within normal limits  SARS CORONAVIRUS 2 (HOSPITAL ORDER, [facility])  URINE CULTURE  PREGNANCY, URINE    EKG None  Radiology Dg Chest Port 1 View  Result Date: 12/11/2018 CLINICAL DATA:  Cough, headache, fever EXAM: PORTABLE CHEST 1 VIEW COMPARISON:  05/14/2018 FINDINGS: Unchanged gross cardiomegaly and pulmonary vascular prominence without overt edema or acute appearing airspace opacity. IMPRESSION:  Cardiomegaly and pulmonary vascular prominence without overt pulmonary edema. No acute appearing airspace opacity. Electronically Signed   By: Eddie Candle M.D.    On: 12/11/2018 11:45    Procedures Procedures (including critical care time)  Medications Ordered in ED Medications  metoCLOPramide (REGLAN) injection 10 mg (10 mg Intravenous Given 12/11/18 1315)  acetaminophen (TYLENOL) tablet 650 mg (650 mg Oral Given 12/11/18 1416)     Initial Impression / Assessment and Plan / ED Course  I have reviewed the triage vital signs and the nursing notes.  Pertinent labs & imaging results that were available during my care of the patient were reviewed by me and considered in my medical decision making (see chart for details).  Clinical Course as of Dec 11 1454  Mon Dec 11, 2018  1207 Cardiomegaly and pulmonary vascular prominence without overt pulmonary edema. No acute appearing airspace opacity noted on CXR.    DG Chest Port 1 View [AH]  1338 UA does not reveal signs of infection. Will order urine culture due to patient's symptoms.   Urinalysis, Routine w reflex microscopic(!) [AH]  1339 Leukocytosis noted at 11.4.   WBC(!): 11.4 [AH]  1339 Hemoglobin low at 11.3. This appears to be baseline when compared to previous values.   Hemoglobin(!): 11.3 [AH]  1449 Patient reports symptoms have improved while in the ER. Discussed findings with patient. Patient states she is comfortable with being discharged at this time.   [AH]    Clinical Course User Index [AH] Arville Lime, PA-C       Patient presents with a fever onset yesterday. Labs, vitals, and imaging reviewed. Sent urine culture due to dysuria. UA does not reveal signs of infection at this time. Patient is stable in no acute distress. Pt HA treated and improved while in ED.  Presentation is like pts typical HA and non concerning for [facility], ICH, Meningitis, or temporal arteritis. Pt without no focal neuro deficits, nuchal rigidity, or change in vision. Discussed strict return precautions. Pt is to follow up with PCP. Pt verbalizes understanding and is agreeable with plan to dc.   Findings and plan  of care discussed with supervising physician Dr. Kathrynn Humble.   Final Clinical Impressions(s) / ED Diagnoses   Final diagnoses:  Fever in adult  Bad headache    ED Discharge Orders    None       Arville Lime, [place] 12/11/18 1457    Varney Biles, MD 12/11/18 Royann Shivers, MD 12/11/18 1921

## 2018-12-11 NOTE — ED Triage Notes (Signed)
Pt in c/o cough, headache and fever that started yesterday, symptoms worsened today, no distress noted

## 2018-12-11 NOTE — Discharge Instructions (Addendum)
You have been seen today for fever and headache. Please read and follow all provided instructions.   1. Medications: tylenol as needed for fever/headaches, usual home medications 2. Treatment: rest, drink plenty of fluids 3. Follow Up: Please follow up with your primary doctor in 2 days for discussion of your diagnoses and further evaluation after today's visit; if you do not have a primary care doctor use the resource guide provided to find one; Please return to the ER for any new or worsening symptoms. Please obtain all of your results from medical records or have your doctors office obtain the results - share them with your doctor - you should be seen at your doctors office. Call today to arrange your follow up.   Take medications as prescribed. Please review all of the medicines and only take them if you do not have an allergy to them. Return to the emergency room for worsening condition or new concerning symptoms. Follow up with your regular doctor. If you don't have a regular doctor use one of the numbers below to establish a primary care doctor.  Please be aware that if you are taking birth control pills, taking other prescriptions, ESPECIALLY ANTIBIOTICS may make the birth control ineffective - if this is the case, either do not engage in sexual activity or use alternative methods of birth control such as condoms until you have finished the medicine and your family doctor says it is OK to restart them. If you are on a blood thinner such as COUMADIN, be aware that any other medicine that you take may cause the coumadin to either work too much, or not enough - you should have your coumadin level rechecked in next 7 days if this is the case.  ?  It is also a possibility that you have an allergic reaction to any of the medicines that you have been prescribed - Everybody reacts differently to medications and while MOST people have no trouble with most medicines, you may have a reaction such as nausea,  vomiting, rash, swelling, shortness of breath. If this is the case, please stop taking the medicine immediately and contact your physician.  ?  You should return to the ER if you develop severe or worsening symptoms.   Emergency Department Resource Guide 1) Find a Doctor and Pay Out of Pocket Although you won't have to find out who is covered by your insurance plan, it is a good idea to ask around and get recommendations. You will then need to call the office and see if the doctor you have chosen will accept you as a new patient and what types of options they offer for patients who are self-pay. Some doctors offer discounts or will set up payment plans for their patients who do not have insurance, but you will need to ask so you aren't surprised when you get to your appointment.  2) Contact Your Local Health Department Not all health departments have doctors that can see patients for sick visits, but many do, so it is worth a call to see if yours does. If you don't know where your local health department is, you can check in your phone book. The CDC also has a tool to help you locate your state's health department, and many state websites also have listings of all of their local health departments.  3) Find a [facility] If your illness is not likely to be very severe or complicated, you may want to try a walk in clinic.  These are popping up all over the country in pharmacies, drugstores, and shopping centers. They're usually staffed by nurse practitioners or physician assistants that have been trained to treat common illnesses and complaints. They're usually fairly quick and inexpensive. However, if you have serious medical issues or chronic medical problems, these are probably not your best option.  No Primary Care Doctor: Call Health Connect at  [phone] - they can help you locate a primary care doctor that  accepts your insurance, provides certain services, etc. Physician Referral Service-  [phone]  Chronic Pain Problems: Organization         Address  Phone   Notes  [facility]  [phone] Patients need to be referred by their primary care doctor.   Medication Assistance: Organization         Address  Phone   Notes  [facility] Medication [facility] [address] [place], [place] [address] [phone] --Must be a resident of [facility] -- Must have NO insurance coverage whatsoever (no Medicaid/ Medicare, etc.) -- The pt. MUST have a primary care doctor that directs their care regularly and follows them in the community   [facility]  [phone]   [employer]  [phone]    Agencies that provide inexpensive medical care: Organization         Address  Phone   Notes  [place]  [phone]   [facility]    [phone]   [name] [place], [place] [address] [phone]   [place] [address], [place] [phone]   [facility]    [phone]   [facility]    [phone]   [place] and [place] [address], [place] Phone:  [phone], Fax:  [phone] Hours of Operation:  9 am - 6 pm, M-F.  Also accepts Medicaid/Medicare and self-pay.  [facility] for [place] [address], [address], [place] Phone: [phone], Fax: [phone]. Hours of Operation:  8:30 am - 5:30 pm, M-F.  Also accepts Medicaid and self-pay.  [facility] [place] [address], [place] Phone: [phone]   [place], [place], [place] [phone], Ext. [phone] Mondays & Thursdays: 7-9 AM.  First 15 patients are seen on a first come, first serve basis.    [place] Providers:  Organization         Address  Phone   Notes  [facility] [address],  [place] [phone] Also accepts self-pay patients.  [facility] [address], [place]  [phone]   [place], [address], [place] [phone]   [facility] [address] [place] [phone]   Lucianne Lei [address], [place]   [phone] Only accepts [place] patients after they have their name applied to their card.   Self-Pay (no insurance) in [facility]:  Organization         Address  Phone   Notes  Sickle Cell Patients, [facility] [place] [phone]   [facility] [place] [phone]   [facility] [place]  [place], [address], [place] [phone]   [facility]/Dr. Osei-Bonsu  [address] [place] or [address], [place] [phone] Phone number for both [place] and [place] locations is the same.  Urgent Medical and [facility] [address] [place] [phone]   [facility] [address] [place] or [address] [phone]   [facility] [address], [place] [phone], phone; [phone], fax Sees patients 1st and 3rd Saturday of every month.  Must not qualify for public or private insurance (i.e. Medicaid, Medicare, [place] Health Choice, Veterans' Benefits)  Household income should be no more than 200% of the poverty level The clinic cannot treat you if you are pregnant or think you are pregnant  Sexually transmitted diseases are not treated at the clinic.

## 2018-12-12 LAB — URINE CULTURE: Culture: <10000 — AB

## 2019-02-12 ENCOUNTER — Other Ambulatory Visit: Payer: Medicare Other | Admitting: *Deleted

## 2019-02-12 ENCOUNTER — Other Ambulatory Visit: Payer: Self-pay

## 2019-02-12 ENCOUNTER — Encounter (HOSPITAL_COMMUNITY): Payer: Medicare Other

## 2019-02-12 DIAGNOSIS — I2510 Atherosclerotic heart disease of native coronary artery without angina pectoris: Secondary | ICD-10-CM | POA: Diagnosis not present

## 2019-02-12 DIAGNOSIS — E782 Mixed hyperlipidemia: Secondary | ICD-10-CM | POA: Diagnosis not present

## 2019-02-12 LAB — LIPID PANEL
Chol/HDL Ratio: 1.7 ratio (ref 0.0–4.4)
Cholesterol, Total: 158 mg/dL (ref 100–199)
HDL: 93 mg/dL (ref >39)
LDL Calculated: 44 mg/dL (ref 0–99)
Triglycerides: 103 mg/dL (ref 0–149)
VLDL Cholesterol Cal: 21 mg/dL (ref 5–40)

## 2019-02-16 ENCOUNTER — Telehealth: Payer: Self-pay | Admitting: *Deleted

## 2019-02-16 NOTE — Telephone Encounter (Signed)
Call placed to pt re: lab results, left a message for pt to call back. (send call to [place] if I'm not available to take call)

## 2019-02-19 ENCOUNTER — Encounter (HOSPITAL_COMMUNITY): Payer: Medicare Other

## 2019-02-19 NOTE — Telephone Encounter (Signed)
The patient has been notified of the result and verbalized understanding.  All questions (if any) were answered. Wilma Flavin, RN 02/19/2019 9:43 AM

## 2019-03-28 DIAGNOSIS — Z79899 Other long term (current) drug therapy: Secondary | ICD-10-CM | POA: Diagnosis not present

## 2019-03-28 DIAGNOSIS — R768 Other specified abnormal immunological findings in serum: Secondary | ICD-10-CM | POA: Diagnosis not present

## 2019-03-28 DIAGNOSIS — I2510 Atherosclerotic heart disease of native coronary artery without angina pectoris: Secondary | ICD-10-CM | POA: Diagnosis not present

## 2019-03-28 DIAGNOSIS — M0579 Rheumatoid arthritis with rheumatoid factor of multiple sites without organ or systems involvement: Secondary | ICD-10-CM | POA: Diagnosis not present

## 2019-03-28 DIAGNOSIS — R635 Abnormal weight gain: Secondary | ICD-10-CM | POA: Diagnosis not present

## 2019-03-28 DIAGNOSIS — E785 Hyperlipidemia, unspecified: Secondary | ICD-10-CM | POA: Diagnosis not present

## 2019-03-28 DIAGNOSIS — M109 Gout, unspecified: Secondary | ICD-10-CM | POA: Diagnosis not present

## 2019-03-29 DIAGNOSIS — R768 Other specified abnormal immunological findings in serum: Secondary | ICD-10-CM | POA: Diagnosis not present

## 2019-03-29 DIAGNOSIS — M0579 Rheumatoid arthritis with rheumatoid factor of multiple sites without organ or systems involvement: Secondary | ICD-10-CM | POA: Diagnosis not present

## 2019-03-29 DIAGNOSIS — M109 Gout, unspecified: Secondary | ICD-10-CM | POA: Diagnosis not present

## 2019-04-11 DIAGNOSIS — K219 Gastro-esophageal reflux disease without esophagitis: Secondary | ICD-10-CM | POA: Diagnosis not present

## 2019-04-11 DIAGNOSIS — I2510 Atherosclerotic heart disease of native coronary artery without angina pectoris: Secondary | ICD-10-CM | POA: Diagnosis not present

## 2019-04-11 DIAGNOSIS — M069 Rheumatoid arthritis, unspecified: Secondary | ICD-10-CM | POA: Diagnosis not present

## 2019-04-11 DIAGNOSIS — E782 Mixed hyperlipidemia: Secondary | ICD-10-CM | POA: Diagnosis not present

## 2019-05-05 DIAGNOSIS — Z23 Encounter for immunization: Secondary | ICD-10-CM | POA: Diagnosis not present

## 2019-05-08 ENCOUNTER — Telehealth: Payer: Self-pay | Admitting: *Deleted

## 2019-05-08 DIAGNOSIS — Z006 Encounter for examination for normal comparison and control in clinical research program: Secondary | ICD-10-CM

## 2019-05-08 NOTE — Telephone Encounter (Signed)
GOULD EDUInformed Consent  Subject Name:Cindy Holmes  Subject met inclusion and exclusion criteria. The informed consent form, study requirements and expectations were reviewed with the subject and questions and concerns were addressed prior to the signing of the consent form. The subject verbalized understanding of the trial requirements. The subject agreed to participate in the [place] and gave verbal consent to participate in the [place] 05/04/2019. The informed consent was obtained prior to performance of any protocol-specific procedures for the subject. A copy of the signed informed consent was mailedto the subject and a copy was placed in the subject's medical record.  Oletta Cohn.

## 2019-05-10 ENCOUNTER — Other Ambulatory Visit: Payer: Self-pay | Admitting: Cardiology

## 2019-05-10 DIAGNOSIS — I2510 Atherosclerotic heart disease of native coronary artery without angina pectoris: Secondary | ICD-10-CM

## 2019-05-21 ENCOUNTER — Telehealth (HOSPITAL_COMMUNITY): Payer: Self-pay

## 2019-05-21 NOTE — Telephone Encounter (Signed)
Spoke with the patient's husband with her instructions for the test. He stated that she would be here for her test. Asked to call back with any questions. S.Carren Blakley EMTP

## 2019-05-22 ENCOUNTER — Ambulatory Visit (HOSPITAL_COMMUNITY)
Admission: RE | Admit: 2019-05-22 | Discharge: 2019-05-22 | Disposition: A | Discharge status: Home / Self Care | Payer: Medicare Other | Source: Ambulatory Visit | Attending: Cardiology | Admitting: Cardiology

## 2019-05-22 ENCOUNTER — Other Ambulatory Visit: Payer: Self-pay

## 2019-05-22 ENCOUNTER — Ambulatory Visit (HOSPITAL_BASED_OUTPATIENT_CLINIC_OR_DEPARTMENT_OTHER): Payer: Medicare Other

## 2019-05-22 DIAGNOSIS — I2510 Atherosclerotic heart disease of native coronary artery without angina pectoris: Secondary | ICD-10-CM

## 2019-05-22 DIAGNOSIS — I6523 Occlusion and stenosis of bilateral carotid arteries: Secondary | ICD-10-CM

## 2019-05-22 LAB — MYOCARDIAL PERFUSION IMAGING
LV dias vol: 55 mL (ref 46–106)
LV sys vol: 8 mL
Peak HR: 83 {beats}/min
Rest HR: 72 {beats}/min
SDS: 6
SRS: 1
SSS: 7
TID: 0.9

## 2019-05-22 MED ORDER — TECHNETIUM TC 99M TETROFOSMIN IV KIT
31.6000 | PACK | Freq: Once | INTRAVENOUS | Status: AC | PRN
  Administered 2019-05-22: 31.6 via INTRAVENOUS
  Filled 2019-05-22: qty 32

## 2019-05-22 MED ORDER — REGADENOSON 0.4 MG/5ML IV SOLN
0.4000 mg | Freq: Once | INTRAVENOUS | Status: AC
  Administered 2019-05-22: 0.4 mg via INTRAVENOUS

## 2019-05-22 MED ORDER — TECHNETIUM TC 99M TETROFOSMIN IV KIT
10.6000 | PACK | Freq: Once | INTRAVENOUS | Status: AC | PRN
  Administered 2019-05-22: 10.6 via INTRAVENOUS
  Filled 2019-05-22: qty 11

## 2019-05-23 ENCOUNTER — Telehealth: Payer: Self-pay | Admitting: *Deleted

## 2019-05-23 NOTE — Telephone Encounter (Signed)
-----   Message from Imogene Burn, PA-C sent at 05/22/2019  3:09 PM EST ----- Carotid dopplers stable. No changes. F/u in 1 yr

## 2019-05-23 NOTE — Telephone Encounter (Signed)
Called patient with test results. No answer. Left message to call back.  

## 2019-06-11 DIAGNOSIS — M069 Rheumatoid arthritis, unspecified: Secondary | ICD-10-CM | POA: Diagnosis not present

## 2019-06-11 DIAGNOSIS — E7849 Other hyperlipidemia: Secondary | ICD-10-CM | POA: Diagnosis not present

## 2019-06-11 DIAGNOSIS — I10 Essential (primary) hypertension: Secondary | ICD-10-CM | POA: Diagnosis not present

## 2019-06-25 DIAGNOSIS — M0579 Rheumatoid arthritis with rheumatoid factor of multiple sites without organ or systems involvement: Secondary | ICD-10-CM | POA: Diagnosis not present

## 2019-06-25 DIAGNOSIS — Z79899 Other long term (current) drug therapy: Secondary | ICD-10-CM | POA: Diagnosis not present

## 2019-07-12 DIAGNOSIS — E7849 Other hyperlipidemia: Secondary | ICD-10-CM | POA: Diagnosis not present

## 2019-07-12 DIAGNOSIS — M069 Rheumatoid arthritis, unspecified: Secondary | ICD-10-CM | POA: Diagnosis not present

## 2019-07-12 DIAGNOSIS — I2510 Atherosclerotic heart disease of native coronary artery without angina pectoris: Secondary | ICD-10-CM | POA: Diagnosis not present

## 2019-07-12 DIAGNOSIS — I10 Essential (primary) hypertension: Secondary | ICD-10-CM | POA: Diagnosis not present

## 2019-08-12 DIAGNOSIS — E7849 Other hyperlipidemia: Secondary | ICD-10-CM | POA: Diagnosis not present

## 2019-08-12 DIAGNOSIS — M069 Rheumatoid arthritis, unspecified: Secondary | ICD-10-CM | POA: Diagnosis not present

## 2019-08-12 DIAGNOSIS — I10 Essential (primary) hypertension: Secondary | ICD-10-CM | POA: Diagnosis not present

## 2019-08-12 DIAGNOSIS — I2510 Atherosclerotic heart disease of native coronary artery without angina pectoris: Secondary | ICD-10-CM | POA: Diagnosis not present

## 2019-09-05 ENCOUNTER — Other Ambulatory Visit: Payer: Self-pay | Admitting: Cardiology

## 2019-09-05 DIAGNOSIS — I2510 Atherosclerotic heart disease of native coronary artery without angina pectoris: Secondary | ICD-10-CM

## 2019-09-05 IMAGING — DX DG WRIST COMPLETE 3+V*R*
3 series · 3 of 3 positions shown · non-contrast
Comparison: Intraoperative fluoroscopy 05/15/2018

CLINICAL DATA: Postoperative right wrist

EXAM:
RIGHT WRIST - COMPLETE 3+ VIEW

[wrist ap]
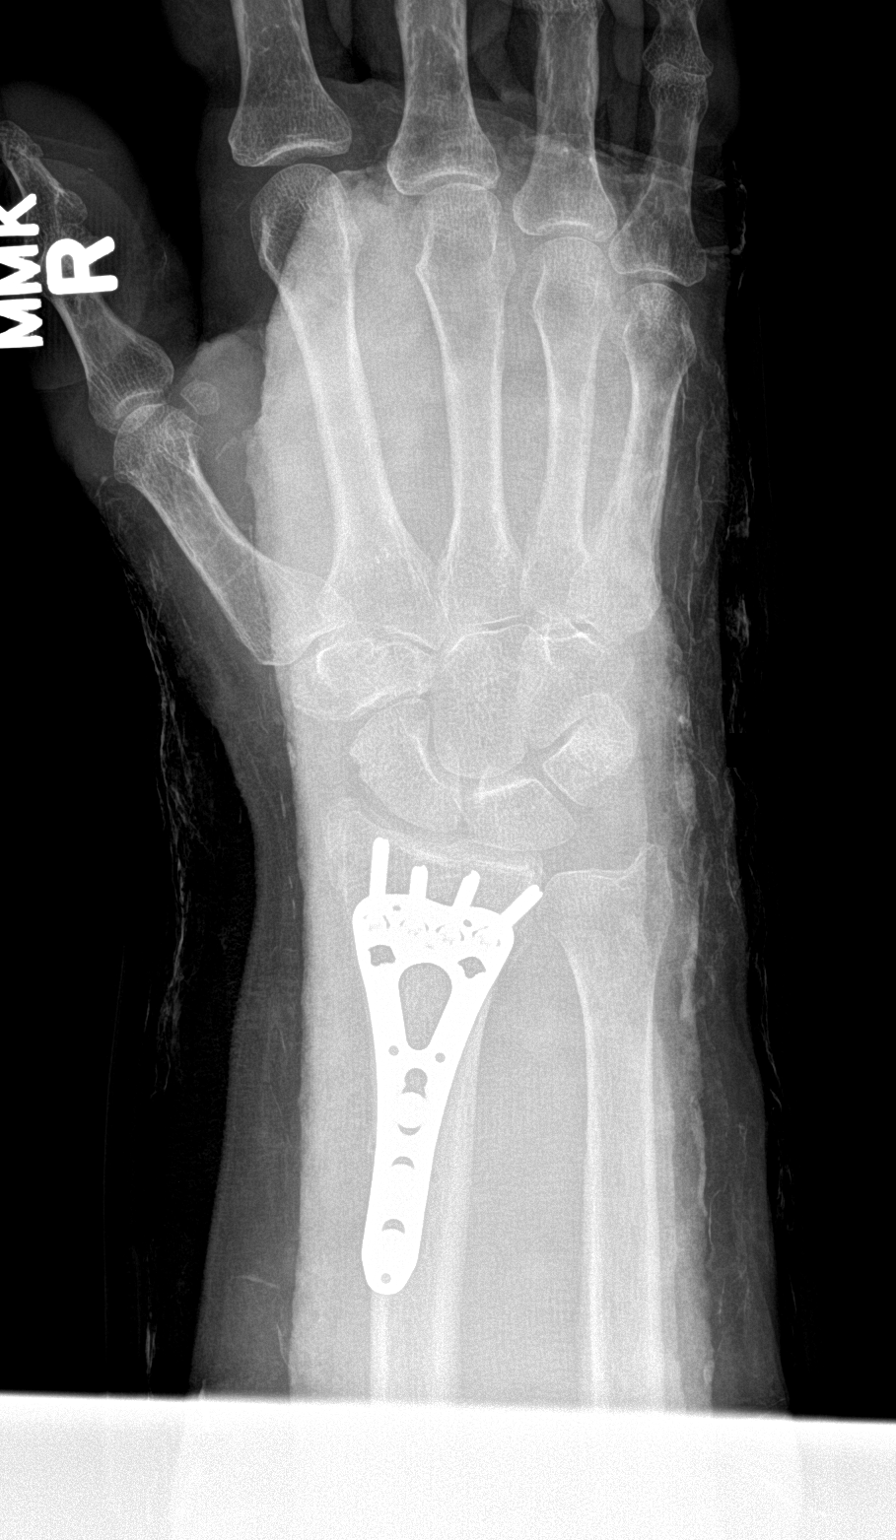

[wrist obl]
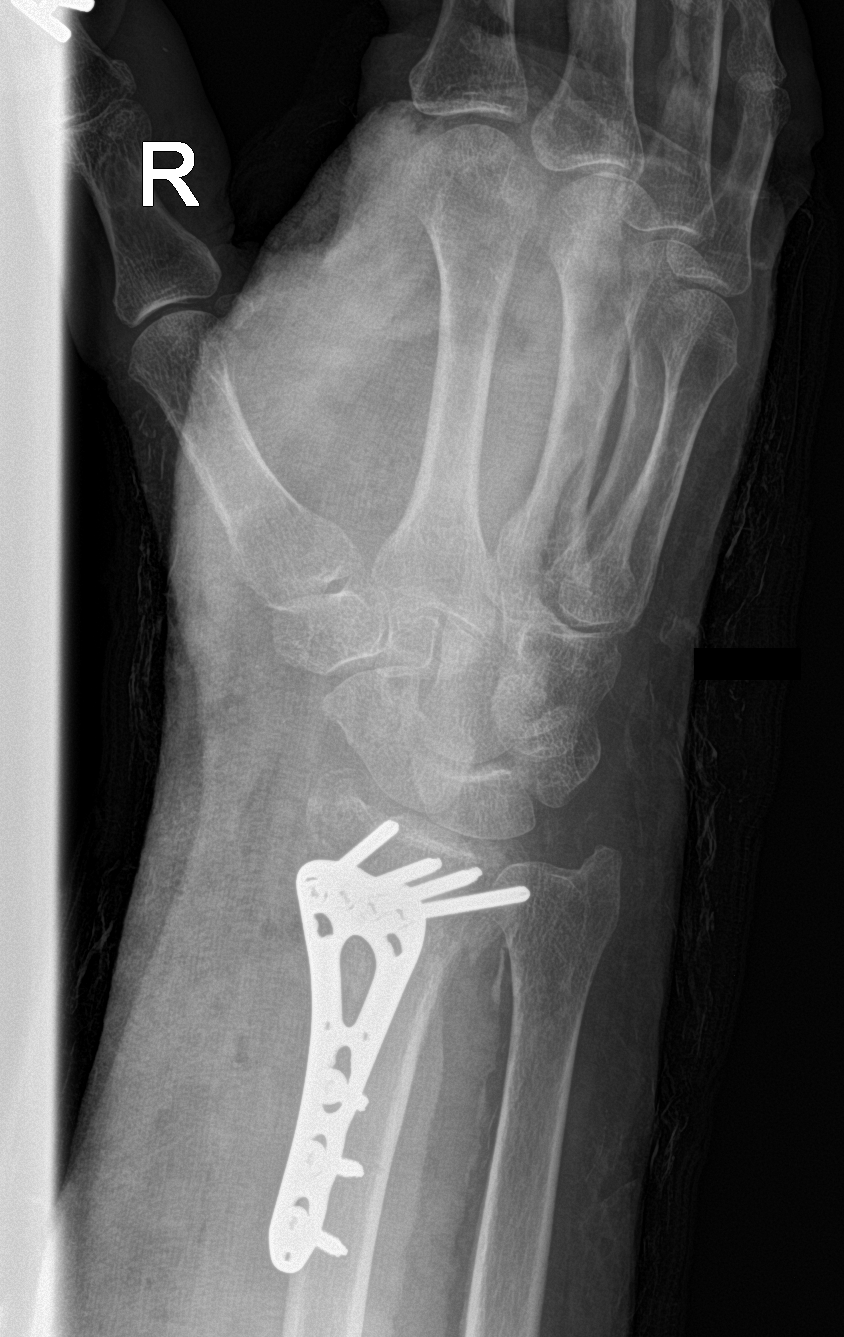

[wrist lat]
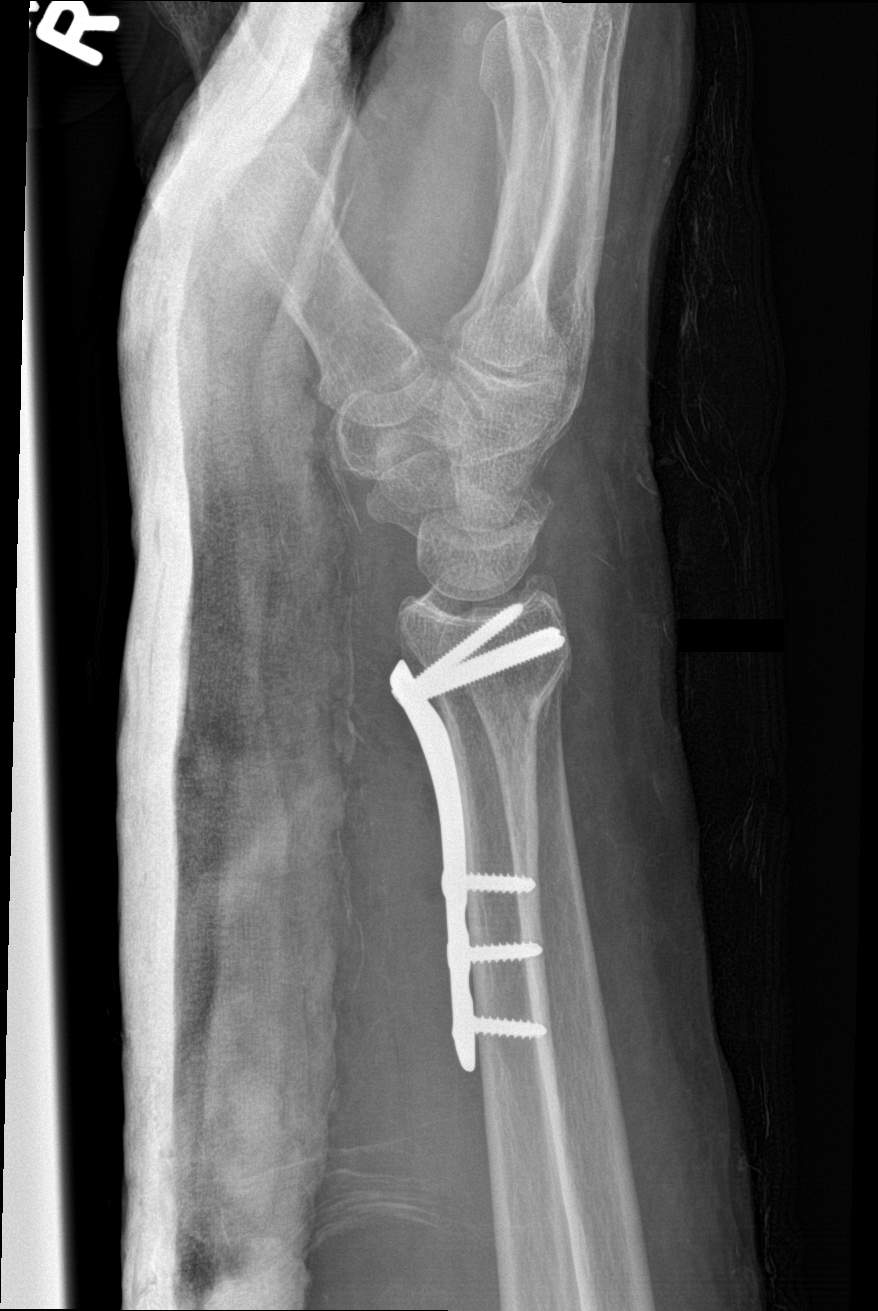

[3 of 3 positions shown; findings below may reference images not displayed]

FINDINGS: Postoperative plate and screw fixation of the distal right radial
metaphysis. Are where components appear intact and well seated.
Near-anatomic alignment of the fracture fragments. Degenerative
changes in the radiocarpal and STT joints. Overlying splint material
obscures some bone detail.
IMPRESSION: Postoperative plate and screw fixation of the distal right radial
metaphysis without apparent complication.

## 2019-09-09 DIAGNOSIS — I2510 Atherosclerotic heart disease of native coronary artery without angina pectoris: Secondary | ICD-10-CM | POA: Diagnosis not present

## 2019-09-09 DIAGNOSIS — I10 Essential (primary) hypertension: Secondary | ICD-10-CM | POA: Diagnosis not present

## 2019-09-09 DIAGNOSIS — E7849 Other hyperlipidemia: Secondary | ICD-10-CM | POA: Diagnosis not present

## 2019-09-09 DIAGNOSIS — E069 Thyroiditis, unspecified: Secondary | ICD-10-CM | POA: Diagnosis not present

## 2019-10-10 DIAGNOSIS — E039 Hypothyroidism, unspecified: Secondary | ICD-10-CM | POA: Diagnosis not present

## 2019-10-10 DIAGNOSIS — I10 Essential (primary) hypertension: Secondary | ICD-10-CM | POA: Diagnosis not present

## 2019-10-10 DIAGNOSIS — E7849 Other hyperlipidemia: Secondary | ICD-10-CM | POA: Diagnosis not present

## 2019-10-10 DIAGNOSIS — I2510 Atherosclerotic heart disease of native coronary artery without angina pectoris: Secondary | ICD-10-CM | POA: Diagnosis not present

## 2019-10-23 DIAGNOSIS — Z20828 Contact with and (suspected) exposure to other viral communicable diseases: Secondary | ICD-10-CM | POA: Diagnosis not present

## 2019-10-23 DIAGNOSIS — U071 COVID-19: Secondary | ICD-10-CM | POA: Diagnosis not present

## 2019-10-23 DIAGNOSIS — Z03818 Encounter for observation for suspected exposure to other biological agents ruled out: Secondary | ICD-10-CM | POA: Diagnosis not present

## 2019-10-24 DIAGNOSIS — U071 COVID-19: Secondary | ICD-10-CM | POA: Diagnosis not present

## 2019-10-24 DIAGNOSIS — J1282 Pneumonia due to coronavirus disease 2019: Secondary | ICD-10-CM | POA: Diagnosis not present

## 2019-10-24 DIAGNOSIS — Z681 Body mass index (BMI) 19 or less, adult: Secondary | ICD-10-CM | POA: Diagnosis not present

## 2019-10-29 ENCOUNTER — Telehealth: Payer: Self-pay | Admitting: Internal Medicine

## 2019-10-29 NOTE — Telephone Encounter (Signed)
Called to discuss with [name] about Covid symptoms and the use of  monoclonal antibody infusion for those with mild to moderate Covid symptoms and at a high risk of hospitalization. She was referred on infusion hotline by her sister's PCP.    Pt does not qualify for infusion therapy as her symptoms first presented > 10 days prior to timing of infusion. Symptoms tier reviewed as well as criteria for ending isolation. Preventative practices reviewed. Patient verbalized understanding     Patient Active Problem List   Diagnosis Date Noted  . Closed displaced trimalleolar fracture of right ankle 05/28/2018  . Fall at home, initial encounter 05/17/2018  . Multiple fractures 05/14/2018  . Fracture of distal end of right radius 05/14/2018  . Tibial plateau fracture, right, closed, initial encounter 05/14/2018  . Candida esophagitis ([place]) 12/03/2016  . Hypokalemia 12/02/2016  . Lactic acidosis 12/02/2016  . AKI (acute kidney injury) ([place]) 12/02/2016  . Chronic diastolic CHF (congestive heart failure) ([place]) 12/02/2016  . Coronary artery disease involving native coronary artery of native heart without angina pectoris 11/13/2015  . Chest pain 11/13/2015  . Hyperlipemia 11/13/2015  . Acute on chronic diastolic CHF (congestive heart failure), NYHA class 3 ([place]) 11/13/2015  . Pain in joint, shoulder region 01/16/2014  . Muscle weakness (generalized) 01/16/2014  . Decreased range of motion of right shoulder 01/16/2014  . Right rotator cuff tear 11/16/2013  . Rotator cuff tear 11/16/2013  . Rheumatoid arthritis ([place])   . Essential hypertension   . Hyperlipidemia   . Stroke ([place])   . Emotional stress reaction   . Leg pain   . Carotid artery disease ([place])   . Overweight 11/04/2008  . EDEMA 11/04/2008    Alan Ripper, NP-C [place]

## 2019-11-09 DIAGNOSIS — I2510 Atherosclerotic heart disease of native coronary artery without angina pectoris: Secondary | ICD-10-CM | POA: Diagnosis not present

## 2019-11-09 DIAGNOSIS — I10 Essential (primary) hypertension: Secondary | ICD-10-CM | POA: Diagnosis not present

## 2019-11-09 DIAGNOSIS — E7849 Other hyperlipidemia: Secondary | ICD-10-CM | POA: Diagnosis not present

## 2019-11-09 DIAGNOSIS — E063 Autoimmune thyroiditis: Secondary | ICD-10-CM | POA: Diagnosis not present

## 2019-11-12 ENCOUNTER — Telehealth: Payer: Self-pay | Admitting: Cardiology

## 2019-11-12 MED ORDER — ATORVASTATIN CALCIUM 40 MG PO TABS: ORAL_TABLET | ORAL | 0 refills | Status: DC

## 2019-11-12 NOTE — Telephone Encounter (Signed)
New message:    Patient calling concerning her atorvastatin 80 mg patient is having trouble taken them.

## 2019-11-12 NOTE — Addendum Note (Signed)
Addended by: Nuala Alpha on: 11/12/2019 09:09 AM   Modules accepted: Orders

## 2019-11-12 NOTE — Telephone Encounter (Signed)
Pt calling in for 2 reason:  One is to have her atorvastatin sent in for the 40 mg tablets and the other is to make a very early follow-up appt with Dr. Meda Coffee or an APP for middle of next week.   Pt states she takes a total of atorvastatin 80 mg daily, but she has always taken the 40 mg tablets to equal this, for she cannot swallow the 80 mg tablets, and gets chocked on these.  Pt states our refill dept recently sent in the 80 mg tablets and not the 40 mg tablets to [facility], and she needs this correctly sent in.  Sent into the pts confirmed pharmacy [facility] for the pt to take atorvastatin 40 mg tablets--take 2 tablets (80 mg total) by mouth daily to [facility], with a note to the pharmacy to fill the 40 mg tablets and not the 80 mg tablets, for pt has difficulty swallowing those. Pt is aware this was sent.   Also the pt is requesting her yearly follow-up for middle of next week, earliest appt available, with Dr. Meda Coffee or an APP.  The only available slot I saw for requested time frame was for next Wednesday 5/12 with [name] PA-C for [phone].  Scheduled this for the pt and advised her to arrive 15 mins prior to this appt.   Also advised her to come fasting to this appt, for he will more than likely check lipids and other labs at this visit.  Pt verbalized understanding and agrees with this plan.

## 2019-11-20 NOTE — Progress Notes (Signed)
Cardiology Office Note:    Date:  11/21/2019   ID:  Cindy Holmes, DOB [birth year], MRN [id number]  PCP:  Redmond School, MD  Cardiologist:  Ena Dawley, MD   Electrophysiologist:  None   Referring MD: Redmond School, MD   Chief Complaint:  Follow-up (CAD)    Patient Profile:    Cindy Holmes is a 69 y.o. female with:   Coronary artery disease   S/p BMS to LAD and POBA to D1 in 2001  S/p POBA to D1 in 2002  Cath 9/17: Moderate in-stent restenosis of proximal LAD (FFR negative)  Myoview 05/2019: Low risk  Diastolic CHF   Echo 0000000: EF 51-70, GR 2 DD  Hypertension   Hyperlipidemia   Morbid obesity  Prior CVA/TIA  Systemic lupus erythematosus   S/p fall in 2019 >> multiple fx's >> DC to rehab   Prior CV studies: Carotid US 05/22/2019 Bilateral ICA 1-39  Myoview 05/22/2019 EF 86, no ischemia or infarction, low risk  Renal artery [place] 01/27/2017 Normal renal arteries, bilaterally Normal caliber abdominal aorta  Echocardiogram 12/03/2016 EF 65-70, normal wall motion, GR 2 DD, mild LAE  Cardiac catheterization 03/12/2016 LM normal LAD proximal stent patent w 40 ISR (FFR 0.84), dist stent patent w 10 ISR; D1 ost 30 LCx patent  RCA patent  EF 55-65  History of Present Illness:    Cindy Holmes last seen via Telemedicine by Ermalinda Barrios, PA-C in 10/2018.  She returns for follow-up.  She is here alone.  She continues to have significant pain in her right leg related to her fall last year.  She also had COVID-19 about a month ago.  It took her several weeks to recover.  She still has occasional congestion and headaches.  She has not had chest pain.  She has not had significant shortness of breath other than the symptoms associated with COVID-19.  She has not had syncope, orthopnea.  She has noted a long history of palpitations.  These typically occur at night and sometimes awaken her from sleep.  She feels that her heartbeat is rapid.  Past Medical History:    Diagnosis Date  . CAD (coronary artery disease)    a. PTCA LAD 2001/cath 2002-prox and mid LAD stents patent, PTCA ostium Dx 1 b. cath 03/2016: patent LAD stent with less than 40% in-stent restenosis, 10-30% stenosis of D1 and distal LAD with continued medical management recommended.  . Candida esophagitis ([place])   . Carotid artery disease ([place])    Doppler September, 2011, 0-39% bilateral disease mild  . Chronic diastolic CHF (congestive heart failure) ([place])   . Edema   . Emotional stress reaction    8/11  . HTN (hypertension)   . Hyperlipidemia   . Leg pain    July, 2012, Arterial Dopplers March 08, 2011 normal  . 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 PONV (postoperative nausea and vomiting)   . Rheumatoid arthritis ([place])   . Right rotator cuff tear 11/16/2013  . Stroke [facility])    mini stroke in past ???    Current Medications: Current Meds  Medication Sig  . acetaminophen (TYLENOL) 325 MG tablet Take 2 tablets (650 mg total) by mouth every 6 (six) hours.  Marland Kitchen allopurinol (ZYLOPRIM) 100 MG tablet Take 400 mg by mouth daily.  Marland Kitchen aspirin 81 MG tablet Take 81 mg by mouth daily.    Marland Kitchen  clopidogrel (PLAVIX) 75 MG tablet Take 1 tablet (75 mg total) by mouth daily. To cont refills you must call [phone] to schedule your f/u appt as you are overdue. Thank you.  . ezetimibe (ZETIA) 10 MG tablet Take 1 tablet (10 mg total) by mouth daily. To cont refills you must call [phone] to schedule your f/u appt as you are overdue. Thank you.  . ferrous sulfate 325 (65 FE) MG tablet Take 1 tablet (325 mg total) by mouth 3 (three) times daily with meals.  . folic acid (FOLVITE) 1 MG tablet Take 1 mg by mouth daily. Takes with chemo medication.  . furosemide (LASIX) 40 MG tablet TAKE 1 TABLET BY MOUTH  DAILY AND MAY TAKE AN  ADDITIONAL 1 TABLET BY  MOUTH DAILY AS NEEDED FOR  SWELLING. To  cont refills you must call [phone] to schedule your f/u appt as you are overdue. Thank you.  . levocetirizine (XYZAL) 5 MG tablet Take 1 tablet by mouth daily.  . Melatonin 3 MG TABS Take 3 mg by mouth at bedtime as needed.  . metoprolol tartrate (LOPRESSOR) 50 MG tablet Take 0.5 tablets (25 mg total) by mouth 2 (two) times daily.  . nitroGLYCERIN (NITROSTAT) 0.4 MG SL tablet DISSOLVE 1 TABLET UNDER THE TONGUE EVERY 5 MINUTES AS  NEEDED (NOT TO EXCEED 3  TABLETS IN 15 MINS, CALL  911 IF CHEST PAIN PERSISTS  . Omega-3 Fatty Acids (FISH OIL) 1000 MG CAPS Take 1 capsule by mouth daily.   . pantoprazole (PROTONIX) 40 MG tablet Take 40 mg by mouth daily.  . predniSONE (DELTASONE) 10 MG tablet Take 10 mg by mouth daily with breakfast.  . ramipril (ALTACE) 2.5 MG capsule Take 1 capsule (2.5 mg total) by mouth daily. To cont refills you must call [phone] to schedule your f/u appt as you are overdue. Thank you.  . senna-docusate (SENOKOT-S) 8.6-50 MG tablet Take 1 tablet by mouth daily as needed for mild constipation.  . [DISCONTINUED] atorvastatin (LIPITOR) 40 MG tablet Take 2 tablets (80 mg total) by mouth daily  . [DISCONTINUED] metoprolol tartrate (LOPRESSOR) 50 MG tablet Take 1 tablet daily. To cont refills you must call [phone] to schedule your f/u appt as you are overdue. Thank you.     Allergies:   Codeine phosphate, Morphine and related, Amoxicillin, Penicillins, and Lidocaine   Social History   Tobacco Use  . Smoking status: Never Smoker  . Smokeless tobacco: Never Used  Substance Use Topics  . Alcohol use: No    Alcohol/week: 0.0 standard drinks  . Drug use: No     Family Hx: The patient's family history includes Arthritis/Rheumatoid in her father; Cancer in her unknown relative; Cirrhosis in her father; Colon cancer in her maternal grandmother; Diabetes in her brother and sister; Heart attack in her maternal aunt, maternal uncle, mother, and unknown relative; Heart  disease in her brother, brother, maternal aunt, and mother; Throat cancer in her maternal aunt and maternal grandfather.  Review of Systems  HENT: Positive for congestion.   Musculoskeletal: Positive for joint pain.     EKGs/Labs/Other Test Reviewed:    EKG:  EKG is  ordered today.  The ekg ordered today demonstrates normal sinus rhythm, heart rate 66, normal axis, nonspecific ST-T wave changes, QTC 457, PAC, no change from prior tracings  Recent Labs: 12/11/2018: ALT 24; BUN 10; Creatinine, Ser 0.74; Hemoglobin 11.3; Platelets 238; Potassium 3.5; Sodium 135   Recent Lipid Panel Lab Results  Component Value Date/Time  CHOL 158 02/12/2019 08:46 AM   TRIG 103 02/12/2019 08:46 AM   HDL 93 02/12/2019 08:46 AM   CHOLHDL 1.7 02/12/2019 08:46 AM   CHOLHDL 2.3 03/12/2016 02:52 AM   LDLCALC 44 02/12/2019 08:46 AM    Physical Exam:    VS:  BP (!) 110/50   Pulse 66   Ht 5\' 3"  (1.6 m)   Wt 227 lb (103 kg)   SpO2 96%   BMI 40.21 kg/m     Wt Readings from Last 3 Encounters:  11/21/19 227 lb (103 kg)  05/22/19 207 lb (93.9 kg)  12/11/18 220 lb (99.8 kg)     Constitutional:      Appearance: Healthy appearance. Not in distress.  Neck:     Thyroid: No thyromegaly.     Vascular: JVD normal.  Pulmonary:     Effort: Pulmonary effort is normal.     Breath sounds: No wheezing. No rales.  Cardiovascular:     Normal rate. Regular rhythm. Normal S1. Normal S2.     Murmurs: There is no murmur.  Edema:    Ankle: bilateral trace edema of the ankle. Abdominal:     Palpations: Abdomen is soft. There is no hepatomegaly.  Skin:    General: Skin is warm and dry.  Neurological:     Mental Status: Alert and oriented to person, place and time.     Cranial Nerves: Cranial nerves are intact.      ASSESSMENT & PLAN:    1. Coronary artery disease involving native coronary artery of native heart without angina pectoris History of bare-metal stent to the LAD and balloon angioplasty to the  first diagonal in 2001.  She had subsequent balloon angioplasty to the first diagonal in 2002.  Cardiac catheterization in 2017 demonstrated moderate in-stent restenosis in LAD.  This was not hemodynamically significant by FFR.  Nuclear stress test in November 2020 demonstrated no ischemia.  She is currently doing well without anginal symptoms.  Continue current dose of aspirin, atorvastatin, clopidogrel, ezetimibe.  She has been taking metoprolol tartrate once daily.  I have advised her to change this to 25 mg twice daily.  Follow-up with Dr. Meda Coffee in 6 months.  2. Chronic diastolic CHF (congestive heart failure) (HCC) Echocardiogram in 2018 demonstrated moderate diastolic dysfunction with normal LV function.  Volume status is currently stable.  Continue current dose of furosemide.  3. Essential hypertension The patient's blood pressure is controlled on her current regimen.  Continue current therapy.   4. Mixed hyperlipidemia LDL optimal on most recent lab work.  Continue current Rx.    5. Palpitations She notes symptoms of rapid palpitations.  I recommend proceeding with an event monitor to rule the possibility of an arrhythmia such as atrial fibrillation.  -Event Monitor     Dispo:  Return in about 6 months (around 05/23/2020) for Routine Follow Up w/ Dr. Meda Coffee, in person.   Medication Adjustments/Labs and Tests Ordered: Current medicines are reviewed at length with the patient today.  Concerns regarding medicines are outlined above.  Tests Ordered: Orders Placed This Encounter  Procedures  . CARDIAC EVENT MONITOR  . EKG 12-Lead   Medication Changes: Meds ordered this encounter  Medications  . metoprolol tartrate (LOPRESSOR) 50 MG tablet    Sig: Take 0.5 tablets (25 mg total) by mouth 2 (two) times daily.    Dispense:  90 tablet    Refill:  0    Signed, [name], PA-C  11/21/2019 9:45 AM  [facility] [place], [place], [place]   [address] Phone: [phone]; Fax: [phone]

## 2019-11-21 ENCOUNTER — Encounter: Payer: Self-pay | Admitting: Physician Assistant

## 2019-11-21 ENCOUNTER — Ambulatory Visit (INDEPENDENT_AMBULATORY_CARE_PROVIDER_SITE_OTHER): Payer: Medicare Other | Admitting: Physician Assistant

## 2019-11-21 ENCOUNTER — Other Ambulatory Visit: Payer: Self-pay

## 2019-11-21 ENCOUNTER — Telehealth: Payer: Self-pay | Admitting: Radiology

## 2019-11-21 VITALS — BP 110/50 | HR 66 | Ht 63.0 in | Wt 227.0 lb

## 2019-11-21 DIAGNOSIS — I2510 Atherosclerotic heart disease of native coronary artery without angina pectoris: Secondary | ICD-10-CM | POA: Diagnosis not present

## 2019-11-21 DIAGNOSIS — I5032 Chronic diastolic (congestive) heart failure: Secondary | ICD-10-CM

## 2019-11-21 DIAGNOSIS — I10 Essential (primary) hypertension: Secondary | ICD-10-CM

## 2019-11-21 DIAGNOSIS — E782 Mixed hyperlipidemia: Secondary | ICD-10-CM

## 2019-11-21 DIAGNOSIS — R002 Palpitations: Secondary | ICD-10-CM | POA: Diagnosis not present

## 2019-11-21 MED ORDER — ATORVASTATIN CALCIUM 40 MG PO TABS: ORAL_TABLET | ORAL | 3 refills | Status: DC

## 2019-11-21 MED ORDER — METOPROLOL TARTRATE 50 MG PO TABS: 25.0000 mg | ORAL_TABLET | Freq: Two times a day (BID) | ORAL | 0 refills | Status: DC

## 2019-11-21 NOTE — Patient Instructions (Signed)
Medication Instructions:   Your physician has recommended you make the following change in your medication:   1) Change Metoprolol to 25 mg, 0.5 tablet by mouth twice a day  *If you need a refill on your cardiac medications before your next appointment, please call your pharmacy*  Lab Work:  None ordered today  Testing/Procedures:  [employer] Cardiac Event Monitor Instructions Your physician has requested you wear your cardiac event monitor for 30 days. [employer] may call or text to confirm a shipping address. The monitor will be sent to a land address via UPS. [employer] will not ship a monitor to a PO BOX. It typically takes 3-5 days to receive your monitor after it has been enrolled. [employer] will assist with USPS tracking if your package is delayed. The telephone number for [employer] is [phone]. Once you have received your monitor, please review the enclosed instructions. Instruction tutorials can also be viewed under help and settings on the enclosed cell phone. Your monitor has already been registered assigning a specific monitor serial # to you.  Applying the monitor Remove cell phone from case and turn it on. The cell phone works as Dealer and needs to be within [employer] of you at all times. The cell phone will need to be charged on a daily basis. We recommend you plug the cell phone into the enclosed charger at your bedside table every night.  Monitor batteries: You will receive two monitor batteries labelled #1 and #2. These are your recorders. Plug battery #2 onto the second connection on the enclosed charger. Keep one battery on the charger at all times. This will keep the monitor battery deactivated. It will also keep it fully charged for when you need to switch your monitor batteries. A small light will be blinking on the battery emblem when it is charging. The light on the battery emblem will remain on when the battery is fully charged.  Open  package of a Monitor strip. Insert battery #1 into black hood on strip and gently squeeze monitor battery onto connection as indicated in instruction booklet. Set aside while preparing skin.  Choose location for your strip, vertical or horizontal, as indicated in the instruction booklet. Shave to remove all hair from location. There cannot be any lotions, oils, powders, or colognes on skin where monitor is to be applied. Wipe skin clean with enclosed Saline wipe. Dry skin completely.  Peel paper labeled #1 off the back of the Monitor strip exposing the adhesive. Place the monitor on the chest in the vertical or horizontal position shown in the instruction booklet. One arrow on the monitor strip must be pointing upward. Carefully remove paper labeled #2, attaching remainder of strip to your skin. Try not to create any folds or wrinkles in the strip as you apply it.  Firmly press and release the circle in the center of the monitor battery. You will hear a small beep. This is turning the monitor battery on. The heart emblem on the monitor battery will light up every 5 seconds if the monitor battery in turned on and connected to the patient securely. Do not push and hold the circle down as this turns the monitor battery off. The cell phone will locate the monitor battery. A screen will appear on the cell phone checking the connection of your monitor strip. This may read poor connection initially but change to good connection within the next minute. Once your monitor accepts the connection you will hear a series of 3  beeps followed by a climbing crescendo of beeps. A screen will appear on the cell phone showing the two monitor strip placement options. Touch the picture that demonstrates where you applied the monitor strip.  Your monitor strip and battery are waterproof. You are able to shower, bathe, or swim with the monitor on. They just ask you do not submerge deeper than 3 feet underwater. We  recommend removing the monitor if you are swimming in a lake, river, or ocean.  Your monitor battery will need to be switched to a fully charged monitor battery approximately once a week. The cell phone will alert you of an action which needs to be made.  On the cell phone, tap for details to reveal connection status, monitor battery status, and cell phone battery status. The green dots indicates your monitor is in good status. A red dot indicates there is something that needs your attention.  To record a symptom, click the circle on the monitor battery. In 30-60 seconds a list of symptoms will appear on the cell phone. Select your symptom and tap save. Your monitor will record a sustained or significant arrhythmia regardless of you clicking the button. Some patients do not feel the heart rhythm irregularities. [employer] will notify us of any serious or critical events.  Refer to instruction booklet for instructions on switching batteries, changing strips, the Do not disturb or Pause features, or any additional questions.  Call [employer] at [phone], to confirm your monitor is transmitting and record your baseline. They will answer any questions you may have regarding the monitor instructions at that time.  Returning the monitor to [place] all equipment back into blue box. Peel off strip of paper to expose adhesive and close box securely. There is a prepaid UPS shipping label on this box. Drop in a UPS drop box, or at a UPS facility like [employer]. You may also contact [employer] to arrange UPS to pick up monitor package at your home.    Follow-Up: At [facility], you and your health needs are our priority.  As part of our continuing mission to provide you with exceptional heart care, we have created designated Provider Care Teams.  These Care Teams include your primary Cardiologist (physician) and Advanced Practice Providers (APPs -  Physician Assistants and Nurse  Practitioners) who all work together to provide you with the care you need, when you need it.  We recommend signing up for the patient portal called "MyChart".  Sign up information is provided on this After Visit Summary.  MyChart is used to connect with patients for Virtual Visits (Telemedicine).  Patients are able to view lab/test results, encounter notes, upcoming appointments, etc.  Non-urgent messages can be sent to your provider as well.   To learn more about what you can do with MyChart, go to [email or web address].    Your next appointment:   6 month(s)  The format for your next appointment:   In Person  Provider:   Ena Dawley, MD

## 2019-11-21 NOTE — Telephone Encounter (Signed)
Enrolled patient for a 30 day [employer] Cardiac event monitor to be mailed to patients home.  

## 2019-11-29 ENCOUNTER — Telehealth: Payer: Self-pay | Admitting: Physician Assistant

## 2019-11-29 DIAGNOSIS — M109 Gout, unspecified: Secondary | ICD-10-CM | POA: Diagnosis not present

## 2019-11-29 DIAGNOSIS — M0579 Rheumatoid arthritis with rheumatoid factor of multiple sites without organ or systems involvement: Secondary | ICD-10-CM | POA: Diagnosis not present

## 2019-11-29 DIAGNOSIS — E785 Hyperlipidemia, unspecified: Secondary | ICD-10-CM | POA: Diagnosis not present

## 2019-11-29 DIAGNOSIS — Z79899 Other long term (current) drug therapy: Secondary | ICD-10-CM | POA: Diagnosis not present

## 2019-11-29 DIAGNOSIS — R768 Other specified abnormal immunological findings in serum: Secondary | ICD-10-CM | POA: Diagnosis not present

## 2019-11-29 DIAGNOSIS — I2510 Atherosclerotic heart disease of native coronary artery without angina pectoris: Secondary | ICD-10-CM | POA: Diagnosis not present

## 2019-11-29 NOTE — Telephone Encounter (Signed)
Patient received her monitor in the mail and states she was told to call the office. Please advise.

## 2019-12-04 NOTE — Telephone Encounter (Signed)
Cindy Holmes is calling stating she is sending the monitor back due to no one calling her after waiting this long and the monitor company reaching out wanting to know why it has not been powered up since being delivered. The monitor has not been used and Cindy Holmes just wanted to make [name] was aware of this.

## 2019-12-05 ENCOUNTER — Telehealth: Payer: Self-pay | Admitting: *Deleted

## 2019-12-05 NOTE — Telephone Encounter (Signed)
Returned call to  patient regarding no one calling her to set up her monitor after she received it.  IApologized if she was misinformed , but we do not usually call the patient to set the monitor up if it has been shipped to the patients home.  I understand patient has already shipped monitor back to [employer].  If she would like, we could schedule an appointment for her to come into the office to have a monitor applied and demonstrated.  I will be out of the office through next week on vacation, but if she would like to schedule a monitor appointment , please call [phone], and ask to speak with Abram Sander, who will be covering the monitor department until my return 12/17/2019.

## 2020-01-22 ENCOUNTER — Other Ambulatory Visit: Payer: Self-pay | Admitting: Cardiology

## 2020-01-22 DIAGNOSIS — I2510 Atherosclerotic heart disease of native coronary artery without angina pectoris: Secondary | ICD-10-CM

## 2020-02-01 ENCOUNTER — Telehealth: Payer: Self-pay | Admitting: Cardiology

## 2020-02-01 MED ORDER — EZETIMIBE 10 MG PO TABS: 10.0000 mg | ORAL_TABLET | Freq: Every day | ORAL | 2 refills | Status: DC

## 2020-02-01 MED ORDER — METOPROLOL TARTRATE 50 MG PO TABS: 25.0000 mg | ORAL_TABLET | Freq: Two times a day (BID) | ORAL | 2 refills | Status: DC

## 2020-02-01 MED ORDER — RAMIPRIL 2.5 MG PO CAPS: 2.5000 mg | ORAL_CAPSULE | Freq: Every day | ORAL | 2 refills | Status: DC

## 2020-02-01 NOTE — Telephone Encounter (Signed)
*  STAT* If patient is at the pharmacy, call can be transferred to refill team.   1. Which medications need to be refilled? (please list name of each medication and dose if known)  ramipril (ALTACE) 2.5 MG capsule metoprolol tartrate (LOPRESSOR) 50 MG tablet ezetimibe (ZETIA) 10 MG tablet   2. Which pharmacy/location (including street and city if local pharmacy) is medication to be sent to? [place], [place] [employer], [address]  3. Do they need a 30 day or 90 day supply? 90  Patient called and said the last time she got her medication from [facility] she only got 30 days and not 90 days. Please re-fax the rx to [place] so that she can get the full 90 days

## 2020-02-01 NOTE — Telephone Encounter (Signed)
Pt's medications were sent to pt's pharmacy as requested. Confirmation received.  

## 2020-02-04 ENCOUNTER — Other Ambulatory Visit: Payer: Self-pay | Admitting: Cardiology

## 2020-02-08 DIAGNOSIS — I10 Essential (primary) hypertension: Secondary | ICD-10-CM | POA: Diagnosis not present

## 2020-02-08 DIAGNOSIS — E063 Autoimmune thyroiditis: Secondary | ICD-10-CM | POA: Diagnosis not present

## 2020-02-08 DIAGNOSIS — I2510 Atherosclerotic heart disease of native coronary artery without angina pectoris: Secondary | ICD-10-CM | POA: Diagnosis not present

## 2020-02-08 DIAGNOSIS — E7849 Other hyperlipidemia: Secondary | ICD-10-CM | POA: Diagnosis not present

## 2020-02-25 ENCOUNTER — Other Ambulatory Visit (HOSPITAL_COMMUNITY): Payer: Self-pay | Admitting: Internal Medicine

## 2020-02-25 DIAGNOSIS — R059 Cough, unspecified: Secondary | ICD-10-CM

## 2020-02-25 DIAGNOSIS — Z6841 Body Mass Index (BMI) 40.0 and over, adult: Secondary | ICD-10-CM | POA: Diagnosis not present

## 2020-02-25 DIAGNOSIS — I10 Essential (primary) hypertension: Secondary | ICD-10-CM | POA: Diagnosis not present

## 2020-02-25 DIAGNOSIS — Z0001 Encounter for general adult medical examination with abnormal findings: Secondary | ICD-10-CM | POA: Diagnosis not present

## 2020-02-25 DIAGNOSIS — J329 Chronic sinusitis, unspecified: Secondary | ICD-10-CM | POA: Diagnosis not present

## 2020-02-25 DIAGNOSIS — R05 Cough: Secondary | ICD-10-CM

## 2020-02-25 DIAGNOSIS — Z1389 Encounter for screening for other disorder: Secondary | ICD-10-CM | POA: Diagnosis not present

## 2020-03-04 DIAGNOSIS — I2510 Atherosclerotic heart disease of native coronary artery without angina pectoris: Secondary | ICD-10-CM | POA: Diagnosis not present

## 2020-03-04 DIAGNOSIS — E785 Hyperlipidemia, unspecified: Secondary | ICD-10-CM | POA: Diagnosis not present

## 2020-03-04 DIAGNOSIS — R768 Other specified abnormal immunological findings in serum: Secondary | ICD-10-CM | POA: Diagnosis not present

## 2020-03-04 DIAGNOSIS — M109 Gout, unspecified: Secondary | ICD-10-CM | POA: Diagnosis not present

## 2020-03-04 DIAGNOSIS — M0579 Rheumatoid arthritis with rheumatoid factor of multiple sites without organ or systems involvement: Secondary | ICD-10-CM | POA: Diagnosis not present

## 2020-03-04 DIAGNOSIS — Z79899 Other long term (current) drug therapy: Secondary | ICD-10-CM | POA: Diagnosis not present

## 2020-03-10 DIAGNOSIS — Z79899 Other long term (current) drug therapy: Secondary | ICD-10-CM | POA: Diagnosis not present

## 2020-03-10 DIAGNOSIS — M0579 Rheumatoid arthritis with rheumatoid factor of multiple sites without organ or systems involvement: Secondary | ICD-10-CM | POA: Diagnosis not present

## 2020-03-10 DIAGNOSIS — M109 Gout, unspecified: Secondary | ICD-10-CM | POA: Diagnosis not present

## 2020-03-11 DIAGNOSIS — E790 Hyperuricemia without signs of inflammatory arthritis and tophaceous disease: Secondary | ICD-10-CM | POA: Diagnosis not present

## 2020-04-10 DIAGNOSIS — I10 Essential (primary) hypertension: Secondary | ICD-10-CM | POA: Diagnosis not present

## 2020-04-10 DIAGNOSIS — I2510 Atherosclerotic heart disease of native coronary artery without angina pectoris: Secondary | ICD-10-CM | POA: Diagnosis not present

## 2020-04-10 DIAGNOSIS — E7849 Other hyperlipidemia: Secondary | ICD-10-CM | POA: Diagnosis not present

## 2020-04-10 DIAGNOSIS — E069 Thyroiditis, unspecified: Secondary | ICD-10-CM | POA: Diagnosis not present

## 2020-04-25 ENCOUNTER — Other Ambulatory Visit: Payer: Self-pay

## 2020-04-25 ENCOUNTER — Emergency Department (HOSPITAL_COMMUNITY): Payer: Medicare Other

## 2020-04-25 ENCOUNTER — Emergency Department (HOSPITAL_COMMUNITY)
Admission: EM | Admit: 2020-04-25 | Discharge: 2020-04-25 | Disposition: A | Discharge status: Home / Self Care | Payer: Medicare Other | Attending: Emergency Medicine | Admitting: Emergency Medicine

## 2020-04-25 ENCOUNTER — Encounter (HOSPITAL_COMMUNITY): Payer: Self-pay

## 2020-04-25 ENCOUNTER — Telehealth: Payer: Self-pay | Admitting: Physician Assistant

## 2020-04-25 DIAGNOSIS — Z7982 Long term (current) use of aspirin: Secondary | ICD-10-CM | POA: Diagnosis not present

## 2020-04-25 DIAGNOSIS — I110 Hypertensive heart disease with heart failure: Secondary | ICD-10-CM | POA: Insufficient documentation

## 2020-04-25 DIAGNOSIS — R0602 Shortness of breath: Secondary | ICD-10-CM | POA: Insufficient documentation

## 2020-04-25 DIAGNOSIS — R600 Localized edema: Secondary | ICD-10-CM | POA: Diagnosis not present

## 2020-04-25 DIAGNOSIS — R059 Cough, unspecified: Secondary | ICD-10-CM | POA: Diagnosis present

## 2020-04-25 DIAGNOSIS — I2510 Atherosclerotic heart disease of native coronary artery without angina pectoris: Secondary | ICD-10-CM | POA: Insufficient documentation

## 2020-04-25 DIAGNOSIS — J3489 Other specified disorders of nose and nasal sinuses: Secondary | ICD-10-CM | POA: Diagnosis not present

## 2020-04-25 DIAGNOSIS — Z79899 Other long term (current) drug therapy: Secondary | ICD-10-CM | POA: Insufficient documentation

## 2020-04-25 DIAGNOSIS — I5032 Chronic diastolic (congestive) heart failure: Secondary | ICD-10-CM | POA: Diagnosis not present

## 2020-04-25 DIAGNOSIS — R0789 Other chest pain: Secondary | ICD-10-CM

## 2020-04-25 DIAGNOSIS — F419 Anxiety disorder, unspecified: Secondary | ICD-10-CM | POA: Insufficient documentation

## 2020-04-25 DIAGNOSIS — R0981 Nasal congestion: Secondary | ICD-10-CM | POA: Diagnosis not present

## 2020-04-25 LAB — BASIC METABOLIC PANEL
Anion gap: 12 (ref 5–15)
BUN: 15 mg/dL (ref 8–23)
CO2: 28 mmol/L (ref 22–32)
Calcium: 9 mg/dL (ref 8.9–10.3)
Chloride: 94 mmol/L — ABNORMAL LOW (ref 98–111)
Creatinine, Ser: 0.72 mg/dL (ref 0.44–1.00)
GFR, Estimated: >60 mL/min (ref >60)
Glucose, Bld: 98 mg/dL (ref 70–99)
Potassium: 3 mmol/L — ABNORMAL LOW (ref 3.5–5.1)
Sodium: 134 mmol/L — ABNORMAL LOW (ref 135–145)

## 2020-04-25 LAB — CBC WITH DIFFERENTIAL/PLATELET
Abs Immature Granulocytes: 0.08 10*3/uL — ABNORMAL HIGH (ref 0.00–0.07)
Basophils Absolute: 0.1 10*3/uL (ref 0.0–0.1)
Basophils Relative: 0 %
Eosinophils Absolute: 0.1 10*3/uL (ref 0.0–0.5)
Eosinophils Relative: 1 %
HCT: 38.4 % (ref 36.0–46.0)
Hemoglobin: 12.8 g/dL (ref 12.0–15.0)
Immature Granulocytes: 1 %
Lymphocytes Relative: 24 %
Lymphs Abs: 3.4 10*3/uL (ref 0.7–4.0)
MCH: 30.6 pg (ref 26.0–34.0)
MCHC: 33.3 g/dL (ref 30.0–36.0)
MCV: 91.9 fL (ref 80.0–100.0)
Monocytes Absolute: 0.9 10*3/uL (ref 0.1–1.0)
Monocytes Relative: 6 %
Neutro Abs: 9.5 10*3/uL — ABNORMAL HIGH (ref 1.7–7.7)
Neutrophils Relative %: 68 %
Platelets: 301 10*3/uL (ref 150–400)
RBC: 4.18 MIL/uL (ref 3.87–5.11)
RDW: 15.3 % (ref 11.5–15.5)
WBC: 14 10*3/uL — ABNORMAL HIGH (ref 4.0–10.5)
nRBC: 0 % (ref 0.0–0.2)

## 2020-04-25 LAB — D-DIMER, QUANTITATIVE: D-Dimer, Quant: 0.59 ug/mL-FEU — ABNORMAL HIGH (ref 0.00–0.50)

## 2020-04-25 LAB — TROPONIN I (HIGH SENSITIVITY)
Troponin I (High Sensitivity): 7 ng/L (ref <18)
Troponin I (High Sensitivity): 7 ng/L (ref <18)

## 2020-04-25 MED ORDER — ALBUTEROL SULFATE HFA 108 (90 BASE) MCG/ACT IN AERS: 3.0000 | INHALATION_SPRAY | Freq: Once | RESPIRATORY_TRACT | Status: DC

## 2020-04-25 MED ORDER — AEROCHAMBER Z-STAT PLUS/MEDIUM MISC: 1.0000 | Freq: Once | Status: DC

## 2020-04-25 MED ORDER — NITROGLYCERIN 0.4 MG SL SUBL: SUBLINGUAL_TABLET | SUBLINGUAL | 1 refills | Status: DC

## 2020-04-25 NOTE — Telephone Encounter (Signed)
*  STAT* If patient is at the pharmacy, call can be transferred to refill team.   1. Which medications need to be refilled? (please list name of each medication and dose if known) nitroglycerin  2. Which pharmacy/location (including street and city if local pharmacy) is medication to be sent to? [facility]   3. Do they need a 30 day or 90 day supply? 90 patient would like to have them on hand she is out.

## 2020-04-25 NOTE — ED Triage Notes (Signed)
Pt arrives via POV c/o SOB X 2 weeks. Pt called PCP and was advised to come to ER for a CXR. Pt presents with productive strong cough and chest pain.

## 2020-04-25 NOTE — Telephone Encounter (Signed)
Called pt's medication nitroglycerin into pt's pharmacy [facility] in [place] as requested. Confirmation received.

## 2020-04-25 NOTE — ED Notes (Signed)
Pt upset due to wait times. Explained to pt that I am not allowed to give her any results that she would have to wait for the provider. Pt states she is going home and will find out her results later.

## 2020-04-26 NOTE — ED Provider Notes (Signed)
[facility] Provider Note   CSN: [id number] Arrival date & time: 04/25/20  1925     History Chief Complaint  Patient presents with   Shortness of Breath    Cindy Holmes is a 69 y.o. female with a history of CAD with ptca in 2017, carotid artery disease, chronic CHF, HTN, hyperlipidemia, rheumatoid arthritis on daily prednisone 5 mg, history of cva and who is recovered from Covid 19 diagnosed in April 2021 presenting with a 2 week history of persistent cough which has been productive of a clear to white sputum, chest pain described as constant pressure along with shortness of breath, nasal congestion also with clear to white rhinorrhea. Her cough has been persistent without relief.  She denies n/v, abdominal pain.  No orthopnea, no fevers or chills.  Intermittent wheezing.  She does endorse bilateral lower extremity edema which is chronic and not worsened today, no calf pain, no history of dvt/PE, on daily plavix and has been compliant. She does endorse the chest pressure is similar to prior hx of angina.  Has contacted PCP who recommended coming here for a chest xray.   The history is provided by the patient.       Past Medical History:  Diagnosis Date   CAD (coronary artery disease)    a. PTCA LAD 2001/cath 2002-prox and mid LAD stents patent, PTCA ostium Dx 1 b. cath 03/2016: patent LAD stent with less than 40% in-stent restenosis, 10-30% stenosis of D1 and distal LAD with continued medical management recommended.   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    Patient Active Problem List   Diagnosis Date Noted   Closed displaced trimalleolar fracture of right ankle 05/28/2018   Fall at home, initial encounter 05/17/2018   Multiple fractures 05/14/2018   Fracture of distal end of right radius 05/14/2018   Tibial plateau fracture, right, closed, initial encounter 05/14/2018   Candida esophagitis ([place]) 12/03/2016   Hypokalemia 12/02/2016   Lactic acidosis 12/02/2016   AKI (acute kidney injury) ([place]) 12/02/2016   Chronic diastolic CHF (congestive heart failure) ([place]) 12/02/2016   Coronary artery disease involving native coronary artery of native heart without angina pectoris 11/13/2015   Chest pain 11/13/2015   Hyperlipemia 11/13/2015   Acute on chronic diastolic CHF (congestive heart failure), NYHA class 3 ([place]) 11/13/2015   Pain in joint, shoulder region 01/16/2014   Muscle weakness (generalized) 01/16/2014   Decreased range of motion of right shoulder 01/16/2014   Right rotator cuff tear 11/16/2013   Rotator cuff tear 11/16/2013   Rheumatoid arthritis ([place])    Essential hypertension    Hyperlipidemia    Stroke [facility])    Emotional stress reaction    Leg pain    Carotid artery disease ([place])    Overweight  11/04/2008   EDEMA 11/04/2008    Past Surgical History:  Procedure Laterality Date   CARDIAC CATHETERIZATION     with stent placement in 2001   CARDIAC CATHETERIZATION N/A 03/12/2016   Procedure: Left Heart Cath and Coronary Angiography;  Surgeon: Nelva Bush, MD;  Location: [facility];  Service: Cardiovascular;  Laterality: N/A;   CARDIAC CATHETERIZATION N/A 03/12/2016   Procedure: Intravascular Pressure Wire/FFR Study;  Surgeon: Nelva Bush, MD;  Location: [facility];   Service: Cardiovascular;  Laterality: N/A;   CHOLECYSTECTOMY     CORONARY ANGIOPLASTY     2002   ESOPHAGOGASTRODUODENOSCOPY N/A 12/03/2016   Procedure: ESOPHAGOGASTRODUODENOSCOPY (EGD);  Surgeon: Carol Ada, MD;  Location: [facility];  Service: Endoscopy;  Laterality: N/A;   laproscopic adjustable gastric  banding     with APS standard system.    OPEN REDUCTION INTERNAL FIXATION (ORIF) DISTAL RADIAL FRACTURE Right 05/15/2018   Procedure: OPEN REDUCTION INTERNAL FIXATION (ORIF) DISTAL RADIAL FRACTURE;  Surgeon: Shona Needles, MD;  Location: [facility];  Service: Orthopedics;  Laterality: Right;   ORIF ANKLE FRACTURE Right 05/15/2018   Procedure: OPEN REDUCTION INTERNAL FIXATION (ORIF) ANKLE FRACTURE;  Surgeon: Shona Needles, MD;  Location: [facility];  Service: Orthopedics;  Laterality: Right;   ORIF TIBIA PLATEAU Right 05/15/2018   Procedure: OPEN REDUCTION INTERNAL FIXATION (ORIF) TIBIAL PLATEAU;  Surgeon: Shona Needles, MD;  Location: [facility];  Service: Orthopedics;  Laterality: Right;   Pt has 3 stents     sept 2001   SHOULDER ARTHROSCOPY WITH SUBACROMIAL DECOMPRESSION, ROTATOR CUFF REPAIR AND BICEP TENDON REPAIR Right 11/16/2013   Procedure: RIGHT SHOULDER ARTHROSCOPY, EXTENSIVE DEBRIDEMENT, ROTATOR CUFF REPAIR ;  Surgeon: [name], MD;  Location: [facility];  Service: Orthopedics;  Laterality: Right;   TUBAL LIGATION       OB History   No obstetric history on file.     Family History  Problem Relation Age of Onset   Heart attack Mother    Heart disease Mother    Diabetes Brother    Heart disease Brother    Diabetes Sister    Arthritis/Rheumatoid Father    Cirrhosis Father        hepatic cirrhosis   Heart disease Maternal Aunt    Heart attack Maternal Aunt    Throat cancer Maternal Aunt    Heart attack Maternal Uncle    Heart disease Brother    Cancer Other        family history   Heart attack Other        family history   Colon  cancer Maternal Grandmother    Throat cancer Maternal Grandfather     Social History   Tobacco Use   Smoking status: Never Smoker   Smokeless tobacco: Never Used  Vaping Use   Vaping Use: Never used  Substance Use Topics   Alcohol use: No    Alcohol/week: 0.0 standard drinks   Drug use: No    Home Medications Prior to Admission medications   Medication Sig Start Date End Date Taking? Authorizing Provider  acetaminophen (TYLENOL) 325 MG tablet Take 2 tablets (650 mg total) by mouth every 6 (six) hours. 05/22/18   Nita Sells, MD  allopurinol (ZYLOPRIM) 100 MG tablet Take 400 mg by mouth daily.    [provider]  aspirin 81 MG tablet Take 81 mg by mouth daily.      [provider]  atorvastatin (LIPITOR) 40 MG tablet  Take 2 tablets (80 mg total) by mouth daily 11/21/19   [name] T, PA-C  clopidogrel (PLAVIX) 75 MG tablet TAKE 1 TABLET BY MOUTH  DAILY 02/05/20   Dorothy Spark, MD  ezetimibe (ZETIA) 10 MG tablet Take 1 tablet (10 mg total) by mouth daily. 02/01/20   Dorothy Spark, MD  ferrous sulfate 325 (65 FE) MG tablet Take 1 tablet (325 mg total) by mouth 3 (three) times daily with meals. 05/22/18   Nita Sells, MD  folic acid (FOLVITE) 1 MG tablet Take 1 mg by mouth daily. Takes with chemo medication. 03/05/14   [provider]  furosemide (LASIX) 40 MG tablet TAKE 1 TABLET BY MOUTH  DAILY AND MAY TAKE 1  ADDITIONAL TABLET DAILY AS  NEEDED FOR SWELLING 01/22/20   Dorothy Spark, MD  levocetirizine (XYZAL) 5 MG tablet Take 1 tablet by mouth daily. 04/04/18   [provider]  Melatonin 3 MG TABS Take 3 mg by mouth at bedtime as needed.    [provider]  metoprolol tartrate (LOPRESSOR) 50 MG tablet Take 0.5 tablets (25 mg total) by mouth 2 (two) times daily. 02/01/20   Dorothy Spark, MD  nitroGLYCERIN (NITROSTAT) 0.4 MG SL tablet DISSOLVE 1 TABLET UNDER THE TONGUE EVERY 5 MINUTES AS  NEEDED (NOT TO  EXCEED 3  TABLETS IN 15 MINS, CALL  911 IF CHEST PAIN PERSISTS 04/25/20   Dorothy Spark, MD  Omega-3 Fatty Acids (FISH OIL) 1000 MG CAPS Take 1 capsule by mouth daily.     [provider]  pantoprazole (PROTONIX) 40 MG tablet Take 40 mg by mouth daily.    [provider]  predniSONE (DELTASONE) 10 MG tablet Take 10 mg by mouth daily with breakfast.    [provider]  ramipril (ALTACE) 2.5 MG capsule Take 1 capsule (2.5 mg total) by mouth daily. 02/01/20   Dorothy Spark, MD  senna-docusate (SENOKOT-S) 8.6-50 MG tablet Take 1 tablet by mouth daily as needed for mild constipation.    [provider]    Allergies    Codeine phosphate, Morphine and related, Amoxicillin, Penicillins, and Lidocaine  Review of Systems   Review of Systems  Constitutional: Positive for fatigue. Negative for chills and fever.  HENT: Positive for congestion and rhinorrhea. Negative for sore throat.   Eyes: Negative.   Respiratory: Positive for cough, chest tightness and shortness of breath.   Cardiovascular: Positive for leg swelling. Negative for chest pain.       Per hpi, leg edema chronic  Gastrointestinal: Negative for abdominal pain and nausea.  Genitourinary: Negative.   Musculoskeletal: Positive for arthralgias.  Skin: Negative.  Negative for rash and wound.  Neurological: Negative.   Psychiatric/Behavioral: Negative.   All other systems reviewed and are negative.   Physical Exam Updated Vital Signs BP (!) 156/75 (BP Location: Left Arm)    Pulse 95    Temp 98 F (36.7 C) (Oral)    Resp 19    Ht 5\' 3"  (1.6 m)    Wt 102.1 kg    SpO2 98%    BMI 39.86 kg/m   Physical Exam Vitals and nursing note reviewed.  Constitutional:      Appearance: She is well-developed.  HENT:     Head: Normocephalic and atraumatic.  Eyes:     Conjunctiva/sclera: Conjunctivae normal.  Cardiovascular:     Rate and Rhythm: Normal rate and regular rhythm.     Heart sounds: Normal  heart  sounds. No murmur heard.  No friction rub.  Pulmonary:     Effort: Pulmonary effort is normal. No respiratory distress.     Breath sounds: Decreased breath sounds present. No wheezing, rhonchi or rales.     Comments: Reduced breath sounds throughout, no current wheezing. No rales appreciated.  Abdominal:     General: Bowel sounds are normal.  Musculoskeletal:        General: Normal range of motion.     Cervical back: Normal range of motion.     Right lower leg: No tenderness. Edema present.     Left lower leg: No tenderness. Edema present.  Skin:    General: Skin is warm and dry.     Capillary Refill: Capillary refill takes less than 2 seconds.  Neurological:     General: No focal deficit present.     Mental Status: She is alert.  Psychiatric:        Mood and Affect: Mood is anxious.     ED Results / Procedures / Treatments   Labs (all labs ordered are listed, but only abnormal results are displayed) Labs Reviewed  CBC WITH DIFFERENTIAL/PLATELET - Abnormal; Notable for the following components:      Result Value   WBC 14.0 (*)    Neutro Abs 9.5 (*)    Abs Immature Granulocytes 0.08 (*)    All other components within normal limits  BASIC METABOLIC PANEL - Abnormal; Notable for the following components:   Sodium 134 (*)    Potassium 3.0 (*)    Chloride 94 (*)    All other components within normal limits  D-DIMER, QUANTITATIVE (NOT AT [facility]) - Abnormal; Notable for the following components:   D-Dimer, Quant 0.59 (*)    All other components within normal limits  RESP PANEL BY RT PCR (RSV, FLU A&B, COVID)  TROPONIN I (HIGH SENSITIVITY)  TROPONIN I (HIGH SENSITIVITY)    EKG EKG Interpretation  Date/Time:  Friday April 25 2020 20:14:25 EDT Ventricular Rate:  94 PR Interval:    QRS Duration: 80 QT Interval:  354 QTC Calculation: 442 R Axis:   55 Text Interpretation: Atrial fibrillation ST & T wave abnormality, consider inferior ischemia ST & T wave abnormality,  consider anterolateral ischemia Confirmed by Randal Buba, April ([id number]) on 04/26/2020 10:33:07 AM   Radiology DG Chest Portable 1 View  Result Date: 04/25/2020 CLINICAL DATA:  Productive cough, EXAM: PORTABLE CHEST 1 VIEW COMPARISON:  Jun the e 1, 2020 FINDINGS: The heart size and mediastinal contours are within normal limits. Aortic knob calcifications. Both lungs are clear. The visualized skeletal structures are unremarkable. IMPRESSION: No active disease. Electronically Signed   By: Prudencio Pair M.D.   On: 04/25/2020 21:38    Procedures Procedures (including critical care time)  Medications Ordered in ED Medications - No data to display  ED Course  I have reviewed the triage vital signs and the nursing notes.  Pertinent labs & imaging results that were available during my care of the patient were reviewed by me and considered in my medical decision making (see chart for details).    MDM Rules/Calculators/A&P                          Pt with productive cough, sob and chest pressure - cxr clear for acute pneumonia, no imaging evidence of CHF exacerbation.  D dimer obtained to rule out possible PE as source of sob/chest pain - age adjusted negative, delta  troponin also negative  - these results returned after pt had eloped. I was being informed by RN that pt stated tests were taking too long.  She walked out the door prior to being able to recheck with pt.   Suspect pt has acute bronchitis as source of sx, prob would improved with albuterol mdi's and bumped up dosing of steroids, antitussives.   Final Clinical Impression(s) / ED Diagnoses Final diagnoses:  Cough  SOB (shortness of breath)  Chest pressure    Rx / DC Orders ED Discharge Orders    None       Landis Martins 04/26/20 1418    Sherwood Gambler, MD 04/26/20 1826

## 2020-05-10 DIAGNOSIS — E7849 Other hyperlipidemia: Secondary | ICD-10-CM | POA: Diagnosis not present

## 2020-05-10 DIAGNOSIS — E069 Thyroiditis, unspecified: Secondary | ICD-10-CM | POA: Diagnosis not present

## 2020-05-10 DIAGNOSIS — I2510 Atherosclerotic heart disease of native coronary artery without angina pectoris: Secondary | ICD-10-CM | POA: Diagnosis not present

## 2020-05-10 DIAGNOSIS — I10 Essential (primary) hypertension: Secondary | ICD-10-CM | POA: Diagnosis not present

## 2020-05-26 DIAGNOSIS — M0579 Rheumatoid arthritis with rheumatoid factor of multiple sites without organ or systems involvement: Secondary | ICD-10-CM | POA: Diagnosis not present

## 2020-05-26 DIAGNOSIS — E785 Hyperlipidemia, unspecified: Secondary | ICD-10-CM | POA: Diagnosis not present

## 2020-05-26 DIAGNOSIS — I2510 Atherosclerotic heart disease of native coronary artery without angina pectoris: Secondary | ICD-10-CM | POA: Diagnosis not present

## 2020-05-26 DIAGNOSIS — R768 Other specified abnormal immunological findings in serum: Secondary | ICD-10-CM | POA: Diagnosis not present

## 2020-05-26 DIAGNOSIS — M109 Gout, unspecified: Secondary | ICD-10-CM | POA: Diagnosis not present

## 2020-05-26 DIAGNOSIS — Z23 Encounter for immunization: Secondary | ICD-10-CM | POA: Diagnosis not present

## 2020-05-26 DIAGNOSIS — Z79899 Other long term (current) drug therapy: Secondary | ICD-10-CM | POA: Diagnosis not present

## 2020-05-26 DIAGNOSIS — M549 Dorsalgia, unspecified: Secondary | ICD-10-CM | POA: Diagnosis not present

## 2020-05-28 DIAGNOSIS — M109 Gout, unspecified: Secondary | ICD-10-CM | POA: Diagnosis not present

## 2020-06-10 DIAGNOSIS — I2510 Atherosclerotic heart disease of native coronary artery without angina pectoris: Secondary | ICD-10-CM | POA: Diagnosis not present

## 2020-06-10 DIAGNOSIS — I10 Essential (primary) hypertension: Secondary | ICD-10-CM | POA: Diagnosis not present

## 2020-06-10 DIAGNOSIS — E069 Thyroiditis, unspecified: Secondary | ICD-10-CM | POA: Diagnosis not present

## 2020-06-10 DIAGNOSIS — E7849 Other hyperlipidemia: Secondary | ICD-10-CM | POA: Diagnosis not present

## 2020-08-09 DIAGNOSIS — E7849 Other hyperlipidemia: Secondary | ICD-10-CM | POA: Diagnosis not present

## 2020-08-09 DIAGNOSIS — E069 Thyroiditis, unspecified: Secondary | ICD-10-CM | POA: Diagnosis not present

## 2020-08-09 DIAGNOSIS — I10 Essential (primary) hypertension: Secondary | ICD-10-CM | POA: Diagnosis not present

## 2020-08-09 DIAGNOSIS — I2510 Atherosclerotic heart disease of native coronary artery without angina pectoris: Secondary | ICD-10-CM | POA: Diagnosis not present

## 2020-08-15 ENCOUNTER — Other Ambulatory Visit: Payer: Self-pay | Admitting: Cardiology

## 2020-08-25 DIAGNOSIS — Z79899 Other long term (current) drug therapy: Secondary | ICD-10-CM | POA: Diagnosis not present

## 2020-08-25 DIAGNOSIS — I2510 Atherosclerotic heart disease of native coronary artery without angina pectoris: Secondary | ICD-10-CM | POA: Diagnosis not present

## 2020-08-25 DIAGNOSIS — M109 Gout, unspecified: Secondary | ICD-10-CM | POA: Diagnosis not present

## 2020-08-25 DIAGNOSIS — M0579 Rheumatoid arthritis with rheumatoid factor of multiple sites without organ or systems involvement: Secondary | ICD-10-CM | POA: Diagnosis not present

## 2020-08-25 DIAGNOSIS — R768 Other specified abnormal immunological findings in serum: Secondary | ICD-10-CM | POA: Diagnosis not present

## 2020-08-25 DIAGNOSIS — E785 Hyperlipidemia, unspecified: Secondary | ICD-10-CM | POA: Diagnosis not present

## 2020-08-25 DIAGNOSIS — M549 Dorsalgia, unspecified: Secondary | ICD-10-CM | POA: Diagnosis not present

## 2020-08-28 ENCOUNTER — Other Ambulatory Visit: Payer: Self-pay | Admitting: Cardiology

## 2020-10-08 DIAGNOSIS — I2510 Atherosclerotic heart disease of native coronary artery without angina pectoris: Secondary | ICD-10-CM | POA: Diagnosis not present

## 2020-10-08 DIAGNOSIS — E7849 Other hyperlipidemia: Secondary | ICD-10-CM | POA: Diagnosis not present

## 2020-10-08 DIAGNOSIS — I10 Essential (primary) hypertension: Secondary | ICD-10-CM | POA: Diagnosis not present

## 2020-10-13 ENCOUNTER — Other Ambulatory Visit (HOSPITAL_COMMUNITY): Payer: Self-pay | Admitting: Internal Medicine

## 2020-10-13 DIAGNOSIS — I779 Disorder of arteries and arterioles, unspecified: Secondary | ICD-10-CM | POA: Diagnosis not present

## 2020-10-13 DIAGNOSIS — G894 Chronic pain syndrome: Secondary | ICD-10-CM | POA: Diagnosis not present

## 2020-10-13 DIAGNOSIS — J309 Allergic rhinitis, unspecified: Secondary | ICD-10-CM | POA: Diagnosis not present

## 2020-10-13 DIAGNOSIS — Z1331 Encounter for screening for depression: Secondary | ICD-10-CM | POA: Diagnosis not present

## 2020-10-13 DIAGNOSIS — Z1231 Encounter for screening mammogram for malignant neoplasm of breast: Secondary | ICD-10-CM

## 2020-10-13 DIAGNOSIS — I5032 Chronic diastolic (congestive) heart failure: Secondary | ICD-10-CM | POA: Diagnosis not present

## 2020-10-13 DIAGNOSIS — M0689 Other specified rheumatoid arthritis, multiple sites: Secondary | ICD-10-CM | POA: Diagnosis not present

## 2020-10-13 DIAGNOSIS — E2839 Other primary ovarian failure: Secondary | ICD-10-CM

## 2020-10-13 DIAGNOSIS — Z6841 Body Mass Index (BMI) 40.0 and over, adult: Secondary | ICD-10-CM | POA: Diagnosis not present

## 2020-10-24 ENCOUNTER — Other Ambulatory Visit: Payer: Self-pay | Admitting: Physician Assistant

## 2020-10-24 ENCOUNTER — Other Ambulatory Visit: Payer: Self-pay | Admitting: Cardiology

## 2020-10-25 ENCOUNTER — Encounter (HOSPITAL_COMMUNITY): Payer: Self-pay

## 2020-10-25 ENCOUNTER — Other Ambulatory Visit: Payer: Self-pay

## 2020-10-25 ENCOUNTER — Inpatient Hospital Stay (HOSPITAL_COMMUNITY)
Admission: EM | Admit: 2020-10-25 | Discharge: 2020-10-28 | DRG: 392 | Disposition: A | Discharge status: Home / Self Care | Payer: Medicare Other | Attending: Internal Medicine | Admitting: Internal Medicine

## 2020-10-25 ENCOUNTER — Emergency Department (HOSPITAL_COMMUNITY): Payer: Medicare Other

## 2020-10-25 DIAGNOSIS — R509 Fever, unspecified: Secondary | ICD-10-CM | POA: Diagnosis not present

## 2020-10-25 DIAGNOSIS — Z20822 Contact with and (suspected) exposure to covid-19: Secondary | ICD-10-CM | POA: Diagnosis not present

## 2020-10-25 DIAGNOSIS — I110 Hypertensive heart disease with heart failure: Secondary | ICD-10-CM | POA: Diagnosis present

## 2020-10-25 DIAGNOSIS — Z884 Allergy status to anesthetic agent status: Secondary | ICD-10-CM

## 2020-10-25 DIAGNOSIS — K529 Noninfective gastroenteritis and colitis, unspecified: Secondary | ICD-10-CM | POA: Diagnosis present

## 2020-10-25 DIAGNOSIS — D473 Essential (hemorrhagic) thrombocythemia: Secondary | ICD-10-CM | POA: Diagnosis not present

## 2020-10-25 DIAGNOSIS — Z9049 Acquired absence of other specified parts of digestive tract: Secondary | ICD-10-CM

## 2020-10-25 DIAGNOSIS — R17 Unspecified jaundice: Secondary | ICD-10-CM

## 2020-10-25 DIAGNOSIS — I4891 Unspecified atrial fibrillation: Principal | ICD-10-CM

## 2020-10-25 DIAGNOSIS — E785 Hyperlipidemia, unspecified: Secondary | ICD-10-CM | POA: Diagnosis present

## 2020-10-25 DIAGNOSIS — K5732 Diverticulitis of large intestine without perforation or abscess without bleeding: Principal | ICD-10-CM | POA: Diagnosis present

## 2020-10-25 DIAGNOSIS — R109 Unspecified abdominal pain: Secondary | ICD-10-CM | POA: Diagnosis not present

## 2020-10-25 DIAGNOSIS — Z881 Allergy status to other antibiotic agents status: Secondary | ICD-10-CM

## 2020-10-25 DIAGNOSIS — Z7982 Long term (current) use of aspirin: Secondary | ICD-10-CM

## 2020-10-25 DIAGNOSIS — R197 Diarrhea, unspecified: Secondary | ICD-10-CM

## 2020-10-25 DIAGNOSIS — I517 Cardiomegaly: Secondary | ICD-10-CM | POA: Diagnosis not present

## 2020-10-25 DIAGNOSIS — M1990 Unspecified osteoarthritis, unspecified site: Secondary | ICD-10-CM | POA: Diagnosis present

## 2020-10-25 DIAGNOSIS — Z8673 Personal history of transient ischemic attack (TIA), and cerebral infarction without residual deficits: Secondary | ICD-10-CM

## 2020-10-25 DIAGNOSIS — M069 Rheumatoid arthritis, unspecified: Secondary | ICD-10-CM | POA: Diagnosis present

## 2020-10-25 DIAGNOSIS — Z7902 Long term (current) use of antithrombotics/antiplatelets: Secondary | ICD-10-CM

## 2020-10-25 DIAGNOSIS — I480 Paroxysmal atrial fibrillation: Secondary | ICD-10-CM | POA: Diagnosis present

## 2020-10-25 DIAGNOSIS — Z7952 Long term (current) use of systemic steroids: Secondary | ICD-10-CM

## 2020-10-25 DIAGNOSIS — K5792 Diverticulitis of intestine, part unspecified, without perforation or abscess without bleeding: Secondary | ICD-10-CM | POA: Diagnosis present

## 2020-10-25 DIAGNOSIS — I5032 Chronic diastolic (congestive) heart failure: Secondary | ICD-10-CM | POA: Diagnosis not present

## 2020-10-25 DIAGNOSIS — Z955 Presence of coronary angioplasty implant and graft: Secondary | ICD-10-CM

## 2020-10-25 DIAGNOSIS — R112 Nausea with vomiting, unspecified: Secondary | ICD-10-CM | POA: Diagnosis not present

## 2020-10-25 DIAGNOSIS — D75839 Thrombocytosis, unspecified: Secondary | ICD-10-CM | POA: Diagnosis present

## 2020-10-25 DIAGNOSIS — E871 Hypo-osmolality and hyponatremia: Secondary | ICD-10-CM | POA: Diagnosis not present

## 2020-10-25 DIAGNOSIS — Z79899 Other long term (current) drug therapy: Secondary | ICD-10-CM

## 2020-10-25 DIAGNOSIS — Z880 Allergy status to penicillin: Secondary | ICD-10-CM

## 2020-10-25 DIAGNOSIS — R7401 Elevation of levels of liver transaminase levels: Secondary | ICD-10-CM

## 2020-10-25 DIAGNOSIS — Z885 Allergy status to narcotic agent status: Secondary | ICD-10-CM

## 2020-10-25 DIAGNOSIS — I10 Essential (primary) hypertension: Secondary | ICD-10-CM | POA: Diagnosis present

## 2020-10-25 DIAGNOSIS — M329 Systemic lupus erythematosus, unspecified: Secondary | ICD-10-CM | POA: Diagnosis present

## 2020-10-25 DIAGNOSIS — Z8249 Family history of ischemic heart disease and other diseases of the circulatory system: Secondary | ICD-10-CM

## 2020-10-25 DIAGNOSIS — I2510 Atherosclerotic heart disease of native coronary artery without angina pectoris: Secondary | ICD-10-CM | POA: Diagnosis not present

## 2020-10-25 DIAGNOSIS — E876 Hypokalemia: Secondary | ICD-10-CM | POA: Diagnosis present

## 2020-10-25 LAB — COMPREHENSIVE METABOLIC PANEL
ALT: 54 U/L — ABNORMAL HIGH (ref 0–44)
AST: 63 U/L — ABNORMAL HIGH (ref 15–41)
Albumin: 2.8 g/dL — ABNORMAL LOW (ref 3.5–5.0)
Alkaline Phosphatase: 70 U/L (ref 38–126)
Anion gap: 8 (ref 5–15)
BUN: 8 mg/dL (ref 8–23)
CO2: 17 mmol/L — ABNORMAL LOW (ref 22–32)
Calcium: 8 mg/dL — ABNORMAL LOW (ref 8.9–10.3)
Chloride: 105 mmol/L (ref 98–111)
Creatinine, Ser: 0.75 mg/dL (ref 0.44–1.00)
GFR, Estimated: >60 mL/min (ref >60)
Glucose, Bld: 120 mg/dL — ABNORMAL HIGH (ref 70–99)
Potassium: 5.6 mmol/L — ABNORMAL HIGH (ref 3.5–5.1)
Sodium: 130 mmol/L — ABNORMAL LOW (ref 135–145)
Total Bilirubin: 2.4 mg/dL — ABNORMAL HIGH (ref 0.3–1.2)
Total Protein: 5.9 g/dL — ABNORMAL LOW (ref 6.5–8.1)

## 2020-10-25 LAB — CBC WITH DIFFERENTIAL/PLATELET
Abs Immature Granulocytes: 0.1 10*3/uL — ABNORMAL HIGH (ref 0.00–0.07)
Basophils Absolute: 0.1 10*3/uL (ref 0.0–0.1)
Basophils Relative: 0 %
Eosinophils Absolute: 0.1 10*3/uL (ref 0.0–0.5)
Eosinophils Relative: 0 %
HCT: 42.4 % (ref 36.0–46.0)
Hemoglobin: 14 g/dL (ref 12.0–15.0)
Immature Granulocytes: 1 %
Lymphocytes Relative: 13 %
Lymphs Abs: 2.1 10*3/uL (ref 0.7–4.0)
MCH: 29.9 pg (ref 26.0–34.0)
MCHC: 33 g/dL (ref 30.0–36.0)
MCV: 90.6 fL (ref 80.0–100.0)
Monocytes Absolute: 0.9 10*3/uL (ref 0.1–1.0)
Monocytes Relative: 6 %
Neutro Abs: 12.6 10*3/uL — ABNORMAL HIGH (ref 1.7–7.7)
Neutrophils Relative %: 80 %
Platelets: 485 10*3/uL — ABNORMAL HIGH (ref 150–400)
RBC: 4.68 MIL/uL (ref 3.87–5.11)
RDW: 15.6 % — ABNORMAL HIGH (ref 11.5–15.5)
WBC: 15.8 10*3/uL — ABNORMAL HIGH (ref 4.0–10.5)
nRBC: 0 % (ref 0.0–0.2)

## 2020-10-25 LAB — LIPASE, BLOOD: Lipase: 34 U/L (ref 11–51)

## 2020-10-25 MED ORDER — DILTIAZEM HCL-DEXTROSE 125-5 MG/125ML-% IV SOLN (PREMIX)
5.0000 mg/h | INTRAVENOUS | Status: AC
  Administered 2020-10-26: 15 mg/h via INTRAVENOUS
  Administered 2020-10-26: 5 mg/h via INTRAVENOUS
  Filled 2020-10-25 (×2): qty 125

## 2020-10-25 MED ORDER — LACTATED RINGERS IV BOLUS
500.0000 mL | Freq: Once | INTRAVENOUS | Status: AC
  Administered 2020-10-26: 500 mL via INTRAVENOUS

## 2020-10-25 MED ORDER — DILTIAZEM LOAD VIA INFUSION
10.0000 mg | Freq: Once | INTRAVENOUS | Status: AC
  Administered 2020-10-26: 10 mg via INTRAVENOUS
  Filled 2020-10-25: qty 10

## 2020-10-25 NOTE — ED Provider Notes (Signed)
Care assumed from Dr. Jeanell Sparrow, patient with new onset atrial fibrillation, abdominal pain with diarrhea, leukocytosis pending labs, CT abdomen and pelvis, chest x-ray.  Anticipate need for hospital admission.  CT of abdomen and pelvis shows evidence of diverticulitis, she is started on antibiotics.  Chest x-ray shows cardiomegaly and vascular congestion, no evidence of pneumonia.  Labs are significant for leukocytosis, thrombocytosis, mild elevation of AST and ALT, mild to moderate elevation of total bilirubin, mild hyponatremia.  These will have to be investigated as an inpatient.  Case is discussed with Dr. Hal Hope of [facility], who agrees to admit the patient.  Results for orders placed or performed during the hospital encounter of 10/25/20  Resp Panel by RT-PCR (Flu A&B, Covid) Nasopharyngeal Swab   Specimen: Nasopharyngeal Swab; Nasopharyngeal(NP) swabs in vial transport medium  Result Value Ref Range   SARS Coronavirus 2 by RT PCR NEGATIVE NEGATIVE   Influenza A by PCR NEGATIVE NEGATIVE   Influenza B by PCR NEGATIVE NEGATIVE  CBC with Differential  Result Value Ref Range   WBC 15.8 (H) 4.0 - 10.5 K/uL   RBC 4.68 3.87 - 5.11 MIL/uL   Hemoglobin 14.0 12.0 - 15.0 g/dL   HCT 42.4 36.0 - 46.0 %   MCV 90.6 80.0 - 100.0 fL   MCH 29.9 26.0 - 34.0 pg   MCHC 33.0 30.0 - 36.0 g/dL   RDW 15.6 (H) 11.5 - 15.5 %   Platelets 485 (H) 150 - 400 K/uL   nRBC 0.0 0.0 - 0.2 %   Neutrophils Relative % 80 %   Neutro Abs 12.6 (H) 1.7 - 7.7 K/uL   Lymphocytes Relative 13 %   Lymphs Abs 2.1 0.7 - 4.0 K/uL   Monocytes Relative 6 %   Monocytes Absolute 0.9 0.1 - 1.0 K/uL   Eosinophils Relative 0 %   Eosinophils Absolute 0.1 0.0 - 0.5 K/uL   Basophils Relative 0 %   Basophils Absolute 0.1 0.0 - 0.1 K/uL   Immature Granulocytes 1 %   Abs Immature Granulocytes 0.10 (H) 0.00 - 0.07 K/uL  Comprehensive metabolic panel  Result Value Ref Range   Sodium 130 (L) 135 - 145 mmol/L   Potassium 5.6 (H)  3.5 - 5.1 mmol/L   Chloride 105 98 - 111 mmol/L   CO2 17 (L) 22 - 32 mmol/L   Glucose, Bld 120 (H) 70 - 99 mg/dL   BUN 8 8 - 23 mg/dL   Creatinine, Ser 0.75 0.44 - 1.00 mg/dL   Calcium 8.0 (L) 8.9 - 10.3 mg/dL   Total Protein 5.9 (L) 6.5 - 8.1 g/dL   Albumin 2.8 (L) 3.5 - 5.0 g/dL   AST 63 (H) 15 - 41 U/L   ALT 54 (H) 0 - 44 U/L   Alkaline Phosphatase 70 38 - 126 U/L   Total Bilirubin 2.4 (H) 0.3 - 1.2 mg/dL   GFR, Estimated >60 >60 mL/min   Anion gap 8 5 - 15  Lipase, blood  Result Value Ref Range   Lipase 34 11 - 51 U/L   CT ABDOMEN PELVIS W CONTRAST  Result Date: 10/26/2020 CLINICAL DATA:  Abdominal pain EXAM: CT ABDOMEN AND PELVIS WITH CONTRAST TECHNIQUE: Multidetector CT imaging of the abdomen and pelvis was performed using the standard protocol following bolus administration of intravenous contrast. CONTRAST:  153mL OMNIPAQUE IOHEXOL 300 MG/ML  SOLN COMPARISON:  05/14/2018 FINDINGS: Lower chest: Coronary artery calcifications.  No acute abnormality. Hepatobiliary: No focal liver abnormality is seen. Status post cholecystectomy.  No biliary dilatation. Pancreas: No focal abnormality or ductal dilatation. Spleen: No focal abnormality.  Normal size. Adrenals/Urinary Tract: No adrenal abnormality. No focal renal abnormality. No stones or hydronephrosis. Urinary bladder is unremarkable. Stomach/Bowel: Sigmoid diverticulosis. Inflammatory stranding around the sigmoid colon compatible with active diverticulitis. Gastric lap band noted in place. No complicating feature. Stomach and small bowel decompressed. Vascular/Lymphatic: Aortoiliac atherosclerosis. No evidence of aneurysm or adenopathy. Reproductive: Uterus and adnexa unremarkable.  No mass. Other: No free fluid or free air. Musculoskeletal: No acute bony abnormality. IMPRESSION: Sigmoid diverticulosis. Slight inflammatory stranding around the mid sigmoid colon compatible with active diverticulitis. Ancillary findings as above. Electronically  Signed   By: Rolm Baptise M.D.   On: 10/26/2020 00:56   DG Chest Port 1 View  Result Date: 10/26/2020 CLINICAL DATA:  Fever EXAM: PORTABLE CHEST 1 VIEW COMPARISON:  04/25/2020 FINDINGS: Cardiomegaly, vascular congestion. No confluent opacities, effusions or overt edema. IMPRESSION: Cardiomegaly, vascular congestion. Electronically Signed   By: Rolm Baptise M.D.   On: 10/26/2020 00:10   Images viewed by me.    Delora Fuel, MD 41/63/84 [facility] [place]

## 2020-10-25 NOTE — ED Provider Notes (Signed)
[facility] EMERGENCY DEPARTMENT Provider Note   CSN: [id number] Arrival date & time: 10/25/20  2008     History Chief Complaint  Patient presents with  . Abdominal Pain  . Vomiting    Cindy Holmes is a 70 y.o. female.  HPI     70 year old female history of coronary artery disease, hyperlipidemia, hypertension, toyed arthritis, on steroids, presents today with nausea, vomiting, diarrhea, new onset atrial fibrillation.  She reports that she has been having nausea, vomiting, and crampy abdominal pain since Tuesday.  She feels lightheaded when she stands.  She reports a history of diverticulitis.  She reports some subjective fever and chills.  She tells me that other people in her family have also had some similar symptoms.  She denies any blood in her stool or emesis.  Past Medical History:  Diagnosis Date  . CAD (coronary artery disease)    a. PTCA LAD 2001/cath 2002-prox and mid LAD stents patent, PTCA ostium Dx 1 b. cath 03/2016: patent LAD stent with less than 40% in-stent restenosis, 10-30% stenosis of D1 and distal LAD with continued medical management recommended.  . Candida esophagitis ([place])   . Carotid artery disease ([place])    Doppler September, 2011, 0-39% bilateral disease mild  . Chronic diastolic CHF (congestive heart failure) ([place])   . Edema   . Emotional stress reaction    8/11  . HTN (hypertension)   . Hyperlipidemia   . Leg pain    July, 2012, Arterial Dopplers March 08, 2011 normal  . 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 PONV (postoperative nausea and vomiting)   . Rheumatoid arthritis ([place])   . Right rotator cuff tear 11/16/2013  . Stroke [facility])    mini stroke in past ???    Patient Active Problem List   Diagnosis Date Noted  . Closed displaced trimalleolar fracture of right ankle 05/28/2018  . Fall at home, initial  encounter 05/17/2018  . Multiple fractures 05/14/2018  . Fracture of distal end of right radius 05/14/2018  . Tibial plateau fracture, right, closed, initial encounter 05/14/2018  . Candida esophagitis ([place]) 12/03/2016  . Hypokalemia 12/02/2016  . Lactic acidosis 12/02/2016  . AKI (acute kidney injury) ([place]) 12/02/2016  . Chronic diastolic CHF (congestive heart failure) ([place]) 12/02/2016  . Coronary artery disease involving native coronary artery of native heart without angina pectoris 11/13/2015  . Chest pain 11/13/2015  . Hyperlipemia 11/13/2015  . Acute on chronic diastolic CHF (congestive heart failure), NYHA class 3 ([place]) 11/13/2015  . Pain in joint, shoulder region 01/16/2014  . Muscle weakness (generalized) 01/16/2014  . Decreased range of motion of right shoulder 01/16/2014  . Right rotator cuff tear 11/16/2013  . Rotator cuff tear 11/16/2013  . Rheumatoid arthritis ([place])   . Essential hypertension   . Hyperlipidemia   . Stroke ([place])   . Emotional stress reaction   . Leg pain   . Carotid artery disease ([place])   . Overweight 11/04/2008  . EDEMA 11/04/2008    Past Surgical History:  Procedure Laterality Date  . CARDIAC CATHETERIZATION     with stent placement in 2001  . CARDIAC CATHETERIZATION N/A 03/12/2016   Procedure: Left Heart Cath and Coronary Angiography;  Surgeon: Nelva Bush, MD;  Location: [place] CV LAB;  Service: Cardiovascular;  Laterality: N/A;  . CARDIAC CATHETERIZATION N/A 03/12/2016   Procedure:  Intravascular Pressure Wire/FFR Study;  Surgeon: Nelva Bush, MD;  Location: [place] CV LAB;  Service: Cardiovascular;  Laterality: N/A;  . CHOLECYSTECTOMY    . CORONARY ANGIOPLASTY     2002  . ESOPHAGOGASTRODUODENOSCOPY N/A 12/03/2016   Procedure: ESOPHAGOGASTRODUODENOSCOPY (EGD);  Surgeon: Carol Ada, MD;  Location: [place];  Service: Endoscopy;  Laterality: N/A;  . laproscopic adjustable gastric  banding     with APS standard system.   .  OPEN REDUCTION INTERNAL FIXATION (ORIF) DISTAL RADIAL FRACTURE Right 05/15/2018   Procedure: OPEN REDUCTION INTERNAL FIXATION (ORIF) DISTAL RADIAL FRACTURE;  Surgeon: Shona Needles, MD;  Location: [place];  Service: Orthopedics;  Laterality: Right;  . ORIF ANKLE FRACTURE Right 05/15/2018   Procedure: OPEN REDUCTION INTERNAL FIXATION (ORIF) ANKLE FRACTURE;  Surgeon: Shona Needles, MD;  Location: [place];  Service: Orthopedics;  Laterality: Right;  . ORIF TIBIA PLATEAU Right 05/15/2018   Procedure: OPEN REDUCTION INTERNAL FIXATION (ORIF) TIBIAL PLATEAU;  Surgeon: Shona Needles, MD;  Location: [place];  Service: Orthopedics;  Laterality: Right;  . Pt has 3 stents     sept 2001  . SHOULDER ARTHROSCOPY WITH SUBACROMIAL DECOMPRESSION, ROTATOR CUFF REPAIR AND BICEP TENDON REPAIR Right 11/16/2013   Procedure: RIGHT SHOULDER ARTHROSCOPY, EXTENSIVE DEBRIDEMENT, ROTATOR CUFF REPAIR ;  Surgeon: [name], MD;  Location: [place];  Service: Orthopedics;  Laterality: Right;  . TUBAL LIGATION       OB History   No obstetric history on file.     Family History  Problem Relation Age of Onset  . Heart attack Mother   . Heart disease Mother   . Diabetes Brother   . Heart disease Brother   . Diabetes Sister   . Arthritis/Rheumatoid Father   . Cirrhosis Father        hepatic cirrhosis  . Heart disease Maternal Aunt   . Heart attack Maternal Aunt   . Throat cancer Maternal Aunt   . Heart attack Maternal Uncle   . Heart disease Brother   . Cancer Other        family history  . Heart attack Other        family history  . Colon cancer Maternal Grandmother   . Throat cancer Maternal Grandfather     Social History   Tobacco Use  . Smoking status: Never Smoker  . Smokeless tobacco: Never Used  Vaping Use  . Vaping Use: Never used  Substance Use Topics  . Alcohol use: No    Alcohol/week: 0.0 standard drinks  . Drug use: No    Home Medications Prior to Admission medications    Medication Sig Start Date End Date Taking? Authorizing Provider  acetaminophen (TYLENOL) 500 MG tablet Take 1,000 mg by mouth every 6 (six) hours as needed for headache (pain).   Yes [provider]  allopurinol (ZYLOPRIM) 100 MG tablet Take 400 mg by mouth at bedtime.   Yes [provider]  aspirin EC 81 MG tablet Take 81 mg by mouth at bedtime. Swallow whole.   Yes [provider]  atorvastatin (LIPITOR) 40 MG tablet Take 2 tablets (80 mg total) by mouth daily Patient taking differently: Take 80 mg by mouth at bedtime. 11/21/19  Yes Weaver, Scott T, PA-C  bisacodyl (DULCOLAX) 5 MG EC tablet Take 5 mg by mouth at bedtime as needed for moderate constipation.   Yes [provider]  clopidogrel (PLAVIX) 75 MG tablet TAKE 1 TABLET BY MOUTH  DAILY  Patient taking differently: Take 75 mg by mouth at bedtime. 08/19/20  Yes Dorothy Spark, MD  diphenhydramine-acetaminophen (TYLENOL PM) 25-500 MG TABS tablet Take 12 tablets by mouth at bedtime.   Yes [provider]  doxycycline (VIBRAMYCIN) 100 MG capsule Take 100 mg by mouth 2 (two) times daily. 10/16/20  Yes [provider]  ezetimibe (ZETIA) 10 MG tablet Take 1 tablet (10 mg total) by mouth daily. Please make yearly appt with Dr. Meda Coffee for May 2022 for future refills. Thank you 1st attempt Patient taking differently: Take 10 mg by mouth at bedtime. Please make yearly appt with Dr. Meda Coffee for May 2022 for future refills. Thank you 1st attempt 08/28/20  Yes Dorothy Spark, MD  folic acid (FOLVITE) 1 MG tablet Take 1 mg by mouth at bedtime. 03/05/14  Yes [provider]  furosemide (LASIX) 40 MG tablet TAKE 1 TABLET BY MOUTH  DAILY AND MAY TAKE 1  ADDITIONAL TABLET DAILY AS  NEEDED FOR SWELLING Patient taking differently: Take 80 mg by mouth at bedtime. 01/22/20  Yes Dorothy Spark, MD  hydroxychloroquine (PLAQUENIL) 200 MG tablet Take 200 mg by mouth at bedtime. 09/03/20  Yes [provider]  levocetirizine (XYZAL) 5 MG tablet Take 5 mg by mouth at bedtime. 04/04/18  Yes [provider]  Melatonin 3 MG TABS Take 3 mg by mouth at bedtime as needed (sleep).   Yes [provider]  methotrexate (RHEUMATREX) 2.5 MG tablet Take 10 mg by mouth every Sunday. 08/08/20  Yes [provider]  metoprolol tartrate (LOPRESSOR) 50 MG tablet Take 0.5 tablets (25 mg total) by mouth 2 (two) times daily. Please make yearly appt with Dr. Meda Coffee for May 2022 before anymore refills. Thank you 1st attempt Patient taking differently: Take 100 mg by mouth at bedtime. Please make yearly appt with Dr. Meda Coffee for May 2022 before anymore refills. Thank you 1st attempt 08/28/20  Yes Dorothy Spark, MD  nitroGLYCERIN (NITROSTAT) 0.4 MG SL tablet DISSOLVE 1 TABLET UNDER THE TONGUE EVERY 5 MINUTES AS  NEEDED (NOT TO EXCEED 3  TABLETS IN 15 MINS, CALL  911 IF CHEST PAIN PERSISTS Patient taking differently: Place 0.4 mg under the tongue every 5 (five) minutes as needed for chest pain (not to exceed 3 tablets in 15 minutes, call 911 if chest pain persists). 04/25/20  Yes Dorothy Spark, MD  Omega-3 Fatty Acids (FISH OIL) 1000 MG CAPS Take 1,000 mg by mouth at bedtime.   Yes [provider]  predniSONE (DELTASONE) 5 MG tablet Take 5 mg by mouth every morning. 09/03/20  Yes [provider]  ramipril (ALTACE) 2.5 MG capsule TAKE 1 CAPSULE BY MOUTH  DAILY Patient taking differently: Take 2.5 mg by mouth at bedtime. 08/19/20  Yes Dorothy Spark, MD  valACYclovir (VALTREX) 1000 MG tablet Take 4,000 mg by mouth once as needed (fever blisters). 10/13/20  Yes [provider]  zolpidem (AMBIEN) 5 MG tablet Take 5 mg by mouth at bedtime. 10/13/20  Yes [provider]    Allergies    Codeine phosphate, Morphine and related, Amoxicillin, Penicillins, and Lidocaine  Review of Systems   Review of Systems  All other systems reviewed and are  negative.   Physical Exam Updated Vital Signs BP (!) 151/106   Pulse 61   Temp 98.7 F (37.1 C) (Oral)   Ht 1.6 m (5\' 3" )   Wt 98.4 kg   SpO2 98%   BMI 38.44 kg/m  Physical Exam Vitals and nursing note reviewed.  Constitutional:      General: She is not in acute distress.    Appearance: She is well-developed. She is not ill-appearing.  HENT:     Head: Normocephalic and atraumatic.     Mouth/Throat:     Mouth: Mucous membranes are moist.  Eyes:     Extraocular Movements: Extraocular movements intact.  Cardiovascular:     Rate and Rhythm: Regular rhythm. Tachycardia present.     Heart sounds: Normal heart sounds.  Pulmonary:     Effort: Pulmonary effort is normal.     Breath sounds: Normal breath sounds.  Abdominal:     General: Abdomen is flat. Bowel sounds are normal.     Palpations: Abdomen is soft.     Tenderness: There is generalized abdominal tenderness. There is no guarding or rebound.  Skin:    General: Skin is warm and dry.     Capillary Refill: Capillary refill takes less than 2 seconds.  Neurological:     General: No focal deficit present.     Mental Status: She is alert.  Psychiatric:        Mood and Affect: Mood normal.        Behavior: Behavior normal.     ED Results / Procedures / Treatments   Labs (all labs ordered are listed, but only abnormal results are displayed) Labs Reviewed  CBC WITH DIFFERENTIAL/PLATELET - Abnormal; Notable for the following components:      Result Value   WBC 15.8 (*)    RDW 15.6 (*)    Platelets 485 (*)    Neutro Abs 12.6 (*)    Abs Immature Granulocytes 0.10 (*)    All other components within normal limits  CULTURE, BLOOD (ROUTINE X 2)  CULTURE, BLOOD (ROUTINE X 2)  RESP PANEL BY RT-PCR (FLU A&B, COVID) ARPGX2  GASTROINTESTINAL PANEL BY PCR, STOOL (REPLACES STOOL CULTURE)  URINALYSIS, ROUTINE W REFLEX MICROSCOPIC  COMPREHENSIVE METABOLIC PANEL  LIPASE, BLOOD    EKG EKG  Interpretation  Date/Time:  Saturday October 25 2020 21:26:04 EDT Ventricular Rate:  161 PR Interval:    QRS Duration: 76 QT Interval:  260 QTC Calculation: 425 R Axis:   66 Text Interpretation: Atrial fibrillation with rapid ventricular response Marked ST abnormality, possible inferior subendocardial injury Abnormal ECG Confirmed by Pattricia Boss [phone]) on 10/25/2020 11:04:57 PM   Radiology No results found.  Procedures .Critical Care Performed by: Pattricia Boss, MD Authorized by: Pattricia Boss, MD   Critical care provider statement:    Critical care time (minutes):  45   Critical care end time:  10/25/2020 11:45 PM   Critical care was time spent personally by me on the following activities:  Discussions with consultants, evaluation of patient's response to treatment, examination of patient, ordering and performing treatments and interventions, ordering and review of laboratory studies, ordering and review of radiographic studies, pulse oximetry, re-evaluation of patient's condition, obtaining history from patient or surrogate and review of old charts     Medications Ordered in ED Medications  diltiazem (CARDIZEM) 1 mg/mL load via infusion 10 mg (has no administration in time range)    And  diltiazem (CARDIZEM) 125 mg in dextrose 5% 125 mL (1 mg/mL) infusion (has no administration in time range)    ED Course  I have reviewed the triage vital signs and the nursing notes.  Pertinent labs & imaging results that were available during my care of the patient were reviewed by me  and considered in my medical decision making (see chart for details).    MDM Rules/Calculators/A&P                         70 year old female presents today with nausea vomiting diarrhea and new onset A. fib.  Initial heart rate 160 on my evaluation heart rate has been 10 5-1 20. Blood pressure is 150/106 Labs are pending. Cardizem drip to be started for rate control With labs pending.  CT abdomen pelvis  ordered. 1-nausea vomiting diarrhea/abdominal pain will need labs reviewed and CT reviewed 2 new onset A. fib with RVR patient being rate controlled with Cardizem.  Plan consult to pharmacy for anticoagulation Care discussed with Dr. Basilia Jumbo that he has assumed care Final Clinical Impression(s) / ED Diagnoses Final diagnoses:  New onset atrial fibrillation ([place])  Nausea vomiting and diarrhea    Rx / DC Orders ED Discharge Orders    None       Pattricia Boss, MD 10/25/20 2346

## 2020-10-25 NOTE — ED Triage Notes (Signed)
Emergency Medicine Provider Triage Evaluation Note  Cindy Holmes , a 70 y.o. female  was evaluated in triage.  Pt complains of abdominal pain, vomiting and diarrhea since Tuesday.  Reports feeling lightheaded/dizzy when she stands up.  History of diverticulitis.  Review of Systems  Positive: Abdominal pain, vomiting, diarrhea Negative: Fever  Physical Exam  There were no vitals taken for this visit. Gen:   Awake, no distress   HEENT:  Atraumatic  Resp:  Normal effort  Cardiac:  Normal rate  Abd:   Nondistended, generalized tenderness without rebound or guarding MSK:   Moves extremities without difficulty  Neuro:  Speech clear   Medical Decision Making  Medically screening exam initiated at 8:52 PM.  Appropriate orders placed.  [name] was informed that the remainder of the evaluation will be completed by another provider, this initial triage assessment does not replace that evaluation, and the importance of remaining in the ED until their evaluation is complete.  Clinical Impression  We will obtain lab work and EKG due to lightheadedness   Delia Heady, PA-C 10/25/20 2053

## 2020-10-25 NOTE — ED Triage Notes (Signed)
Pt reports that she has been vomiting, diarrhea, and abdominal pain since Tuesday.

## 2020-10-26 ENCOUNTER — Emergency Department (HOSPITAL_COMMUNITY): Payer: Medicare Other

## 2020-10-26 ENCOUNTER — Encounter (HOSPITAL_COMMUNITY): Payer: Self-pay | Admitting: Internal Medicine

## 2020-10-26 DIAGNOSIS — Z7982 Long term (current) use of aspirin: Secondary | ICD-10-CM | POA: Diagnosis not present

## 2020-10-26 DIAGNOSIS — I480 Paroxysmal atrial fibrillation: Secondary | ICD-10-CM | POA: Diagnosis not present

## 2020-10-26 DIAGNOSIS — M069 Rheumatoid arthritis, unspecified: Secondary | ICD-10-CM | POA: Diagnosis not present

## 2020-10-26 DIAGNOSIS — Z8673 Personal history of transient ischemic attack (TIA), and cerebral infarction without residual deficits: Secondary | ICD-10-CM | POA: Diagnosis not present

## 2020-10-26 DIAGNOSIS — I110 Hypertensive heart disease with heart failure: Secondary | ICD-10-CM | POA: Diagnosis not present

## 2020-10-26 DIAGNOSIS — M329 Systemic lupus erythematosus, unspecified: Secondary | ICD-10-CM | POA: Diagnosis present

## 2020-10-26 DIAGNOSIS — I2510 Atherosclerotic heart disease of native coronary artery without angina pectoris: Secondary | ICD-10-CM | POA: Diagnosis not present

## 2020-10-26 DIAGNOSIS — I4891 Unspecified atrial fibrillation: Secondary | ICD-10-CM | POA: Diagnosis present

## 2020-10-26 DIAGNOSIS — E876 Hypokalemia: Secondary | ICD-10-CM | POA: Diagnosis not present

## 2020-10-26 DIAGNOSIS — E785 Hyperlipidemia, unspecified: Secondary | ICD-10-CM | POA: Diagnosis not present

## 2020-10-26 DIAGNOSIS — Z9049 Acquired absence of other specified parts of digestive tract: Secondary | ICD-10-CM | POA: Diagnosis not present

## 2020-10-26 DIAGNOSIS — I5032 Chronic diastolic (congestive) heart failure: Secondary | ICD-10-CM | POA: Diagnosis not present

## 2020-10-26 DIAGNOSIS — R112 Nausea with vomiting, unspecified: Secondary | ICD-10-CM | POA: Insufficient documentation

## 2020-10-26 DIAGNOSIS — R197 Diarrhea, unspecified: Secondary | ICD-10-CM | POA: Diagnosis not present

## 2020-10-26 DIAGNOSIS — Z884 Allergy status to anesthetic agent status: Secondary | ICD-10-CM | POA: Diagnosis not present

## 2020-10-26 DIAGNOSIS — Z7902 Long term (current) use of antithrombotics/antiplatelets: Secondary | ICD-10-CM | POA: Diagnosis not present

## 2020-10-26 DIAGNOSIS — K5732 Diverticulitis of large intestine without perforation or abscess without bleeding: Secondary | ICD-10-CM | POA: Diagnosis not present

## 2020-10-26 DIAGNOSIS — Z20822 Contact with and (suspected) exposure to covid-19: Secondary | ICD-10-CM | POA: Diagnosis not present

## 2020-10-26 DIAGNOSIS — M1990 Unspecified osteoarthritis, unspecified site: Secondary | ICD-10-CM | POA: Diagnosis not present

## 2020-10-26 DIAGNOSIS — Z79899 Other long term (current) drug therapy: Secondary | ICD-10-CM | POA: Diagnosis not present

## 2020-10-26 DIAGNOSIS — D75839 Thrombocytosis, unspecified: Secondary | ICD-10-CM | POA: Diagnosis present

## 2020-10-26 DIAGNOSIS — Z7952 Long term (current) use of systemic steroids: Secondary | ICD-10-CM | POA: Diagnosis not present

## 2020-10-26 DIAGNOSIS — E871 Hypo-osmolality and hyponatremia: Secondary | ICD-10-CM | POA: Diagnosis not present

## 2020-10-26 DIAGNOSIS — Z955 Presence of coronary angioplasty implant and graft: Secondary | ICD-10-CM | POA: Diagnosis not present

## 2020-10-26 DIAGNOSIS — K529 Noninfective gastroenteritis and colitis, unspecified: Secondary | ICD-10-CM | POA: Diagnosis not present

## 2020-10-26 DIAGNOSIS — I517 Cardiomegaly: Secondary | ICD-10-CM | POA: Diagnosis not present

## 2020-10-26 DIAGNOSIS — R109 Unspecified abdominal pain: Secondary | ICD-10-CM | POA: Diagnosis not present

## 2020-10-26 DIAGNOSIS — K5792 Diverticulitis of intestine, part unspecified, without perforation or abscess without bleeding: Secondary | ICD-10-CM | POA: Diagnosis not present

## 2020-10-26 DIAGNOSIS — Z8249 Family history of ischemic heart disease and other diseases of the circulatory system: Secondary | ICD-10-CM | POA: Diagnosis not present

## 2020-10-26 DIAGNOSIS — R509 Fever, unspecified: Secondary | ICD-10-CM | POA: Diagnosis not present

## 2020-10-26 LAB — CBC WITH DIFFERENTIAL/PLATELET
Abs Immature Granulocytes: 0.07 10*3/uL (ref 0.00–0.07)
Basophils Absolute: 0 10*3/uL (ref 0.0–0.1)
Basophils Relative: 0 %
Eosinophils Absolute: 0.1 10*3/uL (ref 0.0–0.5)
Eosinophils Relative: 1 %
HCT: 35.4 % — ABNORMAL LOW (ref 36.0–46.0)
Hemoglobin: 11.7 g/dL — ABNORMAL LOW (ref 12.0–15.0)
Immature Granulocytes: 1 %
Lymphocytes Relative: 13 %
Lymphs Abs: 1.5 10*3/uL (ref 0.7–4.0)
MCH: 29.9 pg (ref 26.0–34.0)
MCHC: 33.1 g/dL (ref 30.0–36.0)
MCV: 90.5 fL (ref 80.0–100.0)
Monocytes Absolute: 0.7 10*3/uL (ref 0.1–1.0)
Monocytes Relative: 6 %
Neutro Abs: 9.4 10*3/uL — ABNORMAL HIGH (ref 1.7–7.7)
Neutrophils Relative %: 79 %
Platelets: 381 10*3/uL (ref 150–400)
RBC: 3.91 MIL/uL (ref 3.87–5.11)
RDW: 15.6 % — ABNORMAL HIGH (ref 11.5–15.5)
WBC: 11.8 10*3/uL — ABNORMAL HIGH (ref 4.0–10.5)
nRBC: 0 % (ref 0.0–0.2)

## 2020-10-26 LAB — URINALYSIS, ROUTINE W REFLEX MICROSCOPIC
Bacteria, UA: NONE SEEN
Bilirubin Urine: NEGATIVE
Glucose, UA: NEGATIVE mg/dL
Hgb urine dipstick: NEGATIVE
Ketones, ur: NEGATIVE mg/dL
Nitrite: NEGATIVE
Protein, ur: NEGATIVE mg/dL
Specific Gravity, Urine: 1.034 — ABNORMAL HIGH (ref 1.005–1.030)
pH: 6 (ref 5.0–8.0)

## 2020-10-26 LAB — COMPREHENSIVE METABOLIC PANEL
ALT: 59 U/L — ABNORMAL HIGH (ref 0–44)
AST: 32 U/L (ref 15–41)
Albumin: 2.8 g/dL — ABNORMAL LOW (ref 3.5–5.0)
Alkaline Phosphatase: 74 U/L (ref 38–126)
Anion gap: 14 (ref 5–15)
BUN: 10 mg/dL (ref 8–23)
CO2: 19 mmol/L — ABNORMAL LOW (ref 22–32)
Calcium: 8.7 mg/dL — ABNORMAL LOW (ref 8.9–10.3)
Chloride: 101 mmol/L (ref 98–111)
Creatinine, Ser: 0.67 mg/dL (ref 0.44–1.00)
GFR, Estimated: >60 mL/min (ref >60)
Glucose, Bld: 141 mg/dL — ABNORMAL HIGH (ref 70–99)
Potassium: 2.5 mmol/L — CL (ref 3.5–5.1)
Sodium: 134 mmol/L — ABNORMAL LOW (ref 135–145)
Total Bilirubin: 0.6 mg/dL (ref 0.3–1.2)
Total Protein: 5.7 g/dL — ABNORMAL LOW (ref 6.5–8.1)

## 2020-10-26 LAB — HIV ANTIBODY (ROUTINE TESTING W REFLEX): HIV Screen 4th Generation wRfx: NONREACTIVE

## 2020-10-26 LAB — CORTISOL-AM, BLOOD: Cortisol - AM: 65.7 ug/dL — ABNORMAL HIGH (ref 6.7–22.6)

## 2020-10-26 LAB — HEPARIN LEVEL (UNFRACTIONATED): Heparin Unfractionated: 0.38 IU/mL (ref 0.30–0.70)

## 2020-10-26 LAB — RESP PANEL BY RT-PCR (FLU A&B, COVID) ARPGX2
Influenza A by PCR: NEGATIVE
Influenza B by PCR: NEGATIVE
SARS Coronavirus 2 by RT PCR: NEGATIVE

## 2020-10-26 LAB — GLUCOSE, CAPILLARY: Glucose-Capillary: 143 mg/dL — ABNORMAL HIGH (ref 70–99)

## 2020-10-26 LAB — TSH: TSH: 1.234 u[IU]/mL (ref 0.350–4.500)

## 2020-10-26 LAB — TROPONIN I (HIGH SENSITIVITY): Troponin I (High Sensitivity): 17 ng/L (ref <18)

## 2020-10-26 MED ORDER — POTASSIUM CHLORIDE 10 MEQ/100ML IV SOLN
10.0000 meq | INTRAVENOUS | Status: DC
  Administered 2020-10-26: 10 meq via INTRAVENOUS
  Filled 2020-10-26: qty 100

## 2020-10-26 MED ORDER — SODIUM CHLORIDE 0.9 % IV SOLN
2.0000 g | Freq: Once | INTRAVENOUS | Status: AC
  Administered 2020-10-26: 2 g via INTRAVENOUS
  Filled 2020-10-26: qty 20

## 2020-10-26 MED ORDER — METRONIDAZOLE IN NACL 5-0.79 MG/ML-% IV SOLN
500.0000 mg | Freq: Once | INTRAVENOUS | Status: AC
  Administered 2020-10-26: 500 mg via INTRAVENOUS
  Filled 2020-10-26: qty 100

## 2020-10-26 MED ORDER — HYDROMORPHONE HCL 1 MG/ML IJ SOLN: 0.2500 mg | Freq: Once | INTRAMUSCULAR | Status: DC | PRN

## 2020-10-26 MED ORDER — METOPROLOL TARTRATE 25 MG PO TABS
25.0000 mg | ORAL_TABLET | Freq: Two times a day (BID) | ORAL | Status: DC
  Administered 2020-10-26: 25 mg via ORAL
  Filled 2020-10-26: qty 1

## 2020-10-26 MED ORDER — HEPARIN BOLUS VIA INFUSION
3500.0000 [IU] | Freq: Once | INTRAVENOUS | Status: AC
  Administered 2020-10-26: 3500 [IU] via INTRAVENOUS
  Filled 2020-10-26: qty 3500

## 2020-10-26 MED ORDER — METRONIDAZOLE IN NACL 5-0.79 MG/ML-% IV SOLN
500.0000 mg | Freq: Three times a day (TID) | INTRAVENOUS | Status: DC
  Administered 2020-10-26 – 2020-10-28 (×6): 500 mg via INTRAVENOUS
  Filled 2020-10-26 (×7): qty 100

## 2020-10-26 MED ORDER — ACETAMINOPHEN 325 MG PO TABS
650.0000 mg | ORAL_TABLET | Freq: Four times a day (QID) | ORAL | Status: DC | PRN
  Administered 2020-10-28: 650 mg via ORAL
  Filled 2020-10-26: qty 2

## 2020-10-26 MED ORDER — SODIUM CHLORIDE 0.9 % IV SOLN
2.0000 g | Freq: Every day | INTRAVENOUS | Status: DC
  Administered 2020-10-27 – 2020-10-28 (×2): 2 g via INTRAVENOUS
  Filled 2020-10-26: qty 20
  Filled 2020-10-26: qty 2

## 2020-10-26 MED ORDER — METOPROLOL TARTRATE 50 MG PO TABS
50.0000 mg | ORAL_TABLET | Freq: Two times a day (BID) | ORAL | Status: DC
  Administered 2020-10-26 – 2020-10-28 (×4): 50 mg via ORAL
  Filled 2020-10-26 (×4): qty 1

## 2020-10-26 MED ORDER — POTASSIUM CHLORIDE 10 MEQ/100ML IV SOLN
10.0000 meq | INTRAVENOUS | Status: AC
  Administered 2020-10-26 (×3): 10 meq via INTRAVENOUS
  Filled 2020-10-26 (×3): qty 100

## 2020-10-26 MED ORDER — DEXTROSE-NACL 5-0.9 % IV SOLN: INTRAVENOUS | Status: DC

## 2020-10-26 MED ORDER — PREDNISONE 10 MG PO TABS
10.0000 mg | ORAL_TABLET | Freq: Every day | ORAL | Status: DC
  Administered 2020-10-26 – 2020-10-28 (×3): 10 mg via ORAL
  Filled 2020-10-26 (×3): qty 1

## 2020-10-26 MED ORDER — IOHEXOL 300 MG/ML  SOLN
100.0000 mL | Freq: Once | INTRAMUSCULAR | Status: AC | PRN
  Administered 2020-10-26: 100 mL via INTRAVENOUS

## 2020-10-26 MED ORDER — HEPARIN (PORCINE) 25000 UT/250ML-% IV SOLN
1200.0000 [IU]/h | INTRAVENOUS | Status: DC
  Administered 2020-10-26: 1200 [IU]/h via INTRAVENOUS
  Filled 2020-10-26: qty 250

## 2020-10-26 MED ORDER — HYDROCORTISONE NA SUCCINATE PF 100 MG IJ SOLR
50.0000 mg | Freq: Two times a day (BID) | INTRAMUSCULAR | Status: DC
  Administered 2020-10-26: 50 mg via INTRAVENOUS
  Filled 2020-10-26: qty 2

## 2020-10-26 MED ORDER — LACTATED RINGERS IV SOLN: INTRAVENOUS | Status: AC

## 2020-10-26 MED ORDER — ACETAMINOPHEN 650 MG RE SUPP: 650.0000 mg | Freq: Four times a day (QID) | RECTAL | Status: DC | PRN

## 2020-10-26 MED ORDER — ASPIRIN 81 MG PO CHEW
81.0000 mg | CHEWABLE_TABLET | Freq: Every day | ORAL | Status: DC
  Administered 2020-10-26 – 2020-10-28 (×3): 81 mg via ORAL
  Filled 2020-10-26 (×3): qty 1

## 2020-10-26 MED ORDER — ATORVASTATIN CALCIUM 40 MG PO TABS
40.0000 mg | ORAL_TABLET | Freq: Every day | ORAL | Status: DC
  Administered 2020-10-27 – 2020-10-28 (×2): 40 mg via ORAL
  Filled 2020-10-26 (×3): qty 1

## 2020-10-26 MED ORDER — APIXABAN 5 MG PO TABS
5.0000 mg | ORAL_TABLET | Freq: Two times a day (BID) | ORAL | Status: DC
  Administered 2020-10-26 – 2020-10-28 (×4): 5 mg via ORAL
  Filled 2020-10-26 (×4): qty 1

## 2020-10-26 NOTE — Progress Notes (Signed)
Lab is recollecting chem panel now. K+= 2.4 on draw this morning. K+ = 5.6 previously.

## 2020-10-26 NOTE — Progress Notes (Signed)
PROGRESS NOTE    Cindy Holmes  ZOX:[id number] DOB: 22-Jul-1950 DOA: 10/25/2020 PCP: Redmond School, MD   Chief Complaint  Patient presents with  . Abdominal Pain  . Vomiting     Brief Narrative: 70 year old female with a PMH of Hypertension, CAD s/p DES, Chronic Diastolic Heart Failure, Paroxysmal Atrial Fibrillation, Rheumatoid Arthritis on chronic steroids, and 2006 episode of diverticulitis who presents to the ED on 4/16 with LLQ  Pain and recurrent nausea, vomiting, and episodes of diarrhea who was found to be in Afib with RVR and HR ~ 160 beats/min.  Abdominal CT showed moderate sigmoid diverticulitis.  Subjective: Patient states she is feeling much better. Abdominal exam is benign. Patient denies any chest pain or shortness of breath. States she is hungry.   Assessment & Plan: Principal Problem:   Diverticulitis Active Problems:   Essential hypertension   Rheumatoid arthritis ([place])   Coronary artery disease involving native coronary artery of native heart without angina pectoris   Chronic diastolic CHF (congestive heart failure) (HCC)   Atrial fibrillation with RVR (HCC)   Acute diverticulitis  Acute Sigmoid Diverticulitis, uncomplicated, second episode: CT Scan shows moderate sigmoid diverticulitis.  There is no perforation, abscess, obstruction, fistula, or other complication.  There is no rebound tenderness on exam.  CRP level is pending. - Given that stranding is moderate in severity and patient is chronically immunosuppressed, I will treat with antibiotics, fluids, anti-emetics, and bowel rest. - Continue IV Ceftriaxone 1g QD and Flagyl 500 mg Q8h. - Patient is on D5/NS.  Switch to LR 100 CC/hr x 10 hours.  - It is ok to start CLD today and advance to solids tomorrow.  Acute on Chronic Atrial Fibrillation: TSH is normal. - Discontinue Diltiazem Drip. - Start home med Lopressor at 50 mg BID.  - Monitor on telemetry. - CHADsvasc is 6.  Discontinue Heparin Drip  and start Eliquis 5 mg BID.  Patient has not had any acute bleeding. - Echo is ordered. - Cardiology is following, appreciate.  Essential Hypertension: - BP is stable.  Chronic Diastolic Heart Failure: - Monitor respiratory status while on fluids.  Coronary Artery Disease: - Will discuss with patient starting Aspirin and Statin.  Rheumatoid Arthritis / Chronic Steroid Use: - Hold Methotrexate in the setting of acute infection. - Cortisol level is 65 and there is no evidence of AI, trauma, surgery, or critical illness.  There is no need for stress dose steroids.  We will increase home prednisone dosing to 10 mg daily and decrease to 5 mg daily on discharge.   Diet Order            Diet clear liquid Room service appropriate? No; Fluid consistency: Thin  Diet effective now                 Patient's Body mass index is 38.44 kg/m.   DVT prophylaxis: None.  On Eliquis. Code Status:   Code Status: Full Code  Family Communication: plan of care discussed with patient at bedside.  Status is: Inpatient  Remains inpatient appropriate because:Inpatient level of care appropriate due to severity of illness   Dispo: The patient is from: Home              Anticipated d/c is to: Home              Patient currently is not medically stable to d/c.   Difficult to place patient No    Unresulted Labs (From admission, onward)  Start     Ordered   10/27/20 0539  Basic metabolic panel  Daily,   R     Question:  Specimen collection method  Answer:  Lab=Lab collect   10/26/20 0727   10/27/20 0500  CBC with Differential/Platelet  Daily,   R     Question:  Specimen collection method  Answer:  Lab=Lab collect   10/26/20 0727   10/27/20 0500  Heparin level (unfractionated)  Daily,   R     Question:  Specimen collection method  Answer:  Lab=Lab collect   10/26/20 0808   10/26/20 1800  Potassium  Once,   R       Question:  Specimen collection method  Answer:  Lab=Lab collect    10/26/20 1420   10/25/20 2307  Gastrointestinal Panel by PCR , Stool  (Gastrointestinal Panel by PCR, Stool                                                                                                                                                     **Does Not include CLOSTRIDIUM DIFFICILE testing. **If CDIFF testing is needed, place order from the "C Difficile Testing" order set.**)  Once,   STAT        10/25/20 2307   10/25/20 2306  Blood culture (routine x 2)  BLOOD CULTURE X 2,   STAT      10/25/20 2305   10/25/20 2054  Urinalysis, Routine w reflex microscopic  ONCE - STAT,   STAT        10/25/20 2053          Medications reviewed:  Scheduled Meds: . aspirin  81 mg Oral Daily  . [START ON 10/27/2020] atorvastatin  40 mg Oral Daily  . metoprolol tartrate  50 mg Oral BID  . predniSONE  10 mg Oral Q breakfast   Continuous Infusions: . [START ON 10/27/2020] cefTRIAXone (ROCEPHIN)  IV    . dextrose 5 % and 0.9% NaCl 75 mL/hr at 10/26/20 0627  . heparin 1,200 Units/hr (10/26/20 0625)  . metronidazole 500 mg (10/26/20 1347)  . potassium chloride 10 mEq (10/26/20 1519)    Consultants:see note  Procedures:see note  Antimicrobials: Anti-infectives (From admission, onward)   Start     Dose/Rate Route Frequency Ordered Stop   10/27/20 0600  cefTRIAXone (ROCEPHIN) 2 g in sodium chloride 0.9 % 100 mL IVPB        2 g 200 mL/hr over 30 Minutes Intravenous Daily 10/26/20 0443     10/26/20 1200  metroNIDAZOLE (FLAGYL) IVPB 500 mg        500 mg 100 mL/hr over 60 Minutes Intravenous Every 8 hours 10/26/20 0443     10/26/20 0200  cefTRIAXone (ROCEPHIN) 2 g in sodium chloride 0.9 % 100 mL IVPB        2  g 200 mL/hr over 30 Minutes Intravenous  Once 10/26/20 0146 10/26/20 0409   10/26/20 0200  metroNIDAZOLE (FLAGYL) IVPB 500 mg        500 mg 100 mL/hr over 60 Minutes Intravenous  Once 10/26/20 0146 10/26/20 0631     Culture/Microbiology    Component Value Date/Time   SDES URINE,  CLEAN CATCH 12/11/2018 1135   SPECREQUEST NONE 12/11/2018 1135   CULT (A) 12/11/2018 1135    <10,000 COLONIES/mL INSIGNIFICANT GROWTH Performed at [facility] [address], [place], [place] [address]    REPTSTATUS 12/12/2018 FINAL 12/11/2018 1135    Other culture-see note  Objective: Vitals: Today's Vitals   10/26/20 0230 10/26/20 0446 10/26/20 0814 10/26/20 1522  BP: 128/71 (!) 157/75 129/62 (!) 115/49  Pulse: 63 99 78 70  Resp: 19 19 18 18   Temp:  98.6 F (37 C) 98.9 F (37.2 C) 98.8 F (37.1 C)  TempSrc:  Oral Oral Oral  SpO2: 95% 98% 97% 97%  Weight:      Height:      PainSc:  0-No pain  0-No pain    Intake/Output Summary (Last 24 hours) at 10/26/2020 1604 Last data filed at 10/26/2020 0409 Gross per 24 hour  Intake 584.5 ml  Output --  Net 584.5 ml   Filed Weights   10/25/20 2059  Weight: 98.4 kg   Weight change:   Intake/Output from previous day: 04/16 0701 - 04/17 0700 In: 584.5 [IV Piggyback:584.5] Out: -  Intake/Output this shift: No intake/output data recorded. Filed Weights   10/25/20 2059  Weight: 98.4 kg    Examination: General exam: AAO ,NAD, weak appearing. HEENT:Oral mucosa moist, Ear/Nose WNL grossly,dentition normal. Respiratory system: bilaterally diminished,no use of accessory muscle, non tender. Cardiovascular system: did not appreciate murmur, HR 60 beats/min with irregular rhythm, No JVD. Gastrointestinal system: mild tenderness in LLQ, no rebound tenderness, nondistended, BS+. Nervous System: No focal deficits. Extremities: No edema, distal peripheral pulses palpable.  Skin: No rashes or bruises. MSK: Mild weakness and deconditioning.  Data Reviewed: I have personally reviewed following labs and imaging studies CBC: Recent Labs  Lab 10/25/20 2053 10/26/20 0546  WBC 15.8* 11.8*  NEUTROABS 12.6* 9.4*  HGB 14.0 11.7*  HCT 42.4 35.4*  MCV 90.6 90.5  PLT 485* 086   Basic Metabolic Panel: Recent Labs  Lab  10/25/20 2220 10/26/20 0744  NA 130* 134*  K 5.6* 2.5*  CL 105 101  CO2 17* 19*  GLUCOSE 120* 141*  BUN 8 10  CREATININE 0.75 0.67  CALCIUM 8.0* 8.7*   GFR: Estimated Creatinine Clearance: 74.2 mL/min (by C-G formula based on SCr of 0.67 mg/dL). Liver Function Tests: Recent Labs  Lab 10/25/20 2220 10/26/20 0744  AST 63* 32  ALT 54* 59*  ALKPHOS 70 74  BILITOT 2.4* 0.6  PROT 5.9* 5.7*  ALBUMIN 2.8* 2.8*   Recent Labs  Lab 10/25/20 2220  LIPASE 34   CBG: Recent Labs  Lab 10/26/20 0811  GLUCAP 143*    Thyroid Function Tests: Recent Labs    10/26/20 0546  TSH 1.234     Recent Results (from the past 240 hour(s))  Resp Panel by RT-PCR (Flu A&B, Covid) Nasopharyngeal Swab     Status: None   Collection Time: 10/26/20  1:16 AM   Specimen: Nasopharyngeal Swab; Nasopharyngeal(NP) swabs in vial transport medium  Result Value Ref Range Status   SARS Coronavirus 2 by RT PCR NEGATIVE NEGATIVE Final    Comment: (NOTE)  SARS-CoV-2 target nucleic acids are NOT DETECTED.  The SARS-CoV-2 RNA is generally detectable in upper respiratory specimens during the acute phase of infection. The lowest concentration of SARS-CoV-2 viral copies this assay can detect is 138 copies/mL. A negative result does not preclude SARS-Cov-2 infection and should not be used as the sole basis for treatment or other patient management decisions. A negative result may occur with  improper specimen collection/handling, submission of specimen other than nasopharyngeal swab, presence of viral mutation(s) within the areas targeted by this assay, and inadequate number of viral copies(<138 copies/mL). A negative result must be combined with clinical observations, patient history, and epidemiological information. The expected result is Negative.  Fact Sheet for Patients:  EntrepreneurPulse.com.au  Fact Sheet for Healthcare Providers:   IncredibleEmployment.be  This test is no t yet approved or cleared by the [place] FDA and  has been authorized for detection and/or diagnosis of SARS-CoV-2 by FDA under an Emergency Use Authorization (EUA). This EUA will remain  in effect (meaning this test can be used) for the duration of the COVID-19 declaration under Section 564(b)(1) of the Act, 21 U.S.C.section 360bbb-3(b)(1), unless the authorization is terminated  or revoked sooner.       Influenza A by PCR NEGATIVE NEGATIVE Final   Influenza B by PCR NEGATIVE NEGATIVE Final    Comment: (NOTE) The Xpert Xpress SARS-CoV-2/FLU/RSV plus assay is intended as an aid in the diagnosis of influenza from Nasopharyngeal swab specimens and should not be used as a sole basis for treatment. Nasal washings and aspirates are unacceptable for Xpert Xpress SARS-CoV-2/FLU/RSV testing.  Fact Sheet for Patients: EntrepreneurPulse.com.au  Fact Sheet for Healthcare Providers: IncredibleEmployment.be  This test is not yet approved or cleared by the [place] FDA and has been authorized for detection and/or diagnosis of SARS-CoV-2 by FDA under an Emergency Use Authorization (EUA). This EUA will remain in effect (meaning this test can be used) for the duration of the COVID-19 declaration under Section 564(b)(1) of the Act, 21 U.S.C. section 360bbb-3(b)(1), unless the authorization is terminated or revoked.  Performed at [facility], [place] [address], [place], [place] [address]      Radiology Studies: CT ABDOMEN PELVIS W CONTRAST  Result Date: 10/26/2020 CLINICAL DATA:  Abdominal pain EXAM: CT ABDOMEN AND PELVIS WITH CONTRAST TECHNIQUE: Multidetector CT imaging of the abdomen and pelvis was performed using the standard protocol following bolus administration of intravenous contrast. CONTRAST:  150mL OMNIPAQUE IOHEXOL 300 MG/ML  SOLN COMPARISON:  05/14/2018 FINDINGS: Lower  chest: Coronary artery calcifications.  No acute abnormality. Hepatobiliary: No focal liver abnormality is seen. Status post cholecystectomy. No biliary dilatation. Pancreas: No focal abnormality or ductal dilatation. Spleen: No focal abnormality.  Normal size. Adrenals/Urinary Tract: No adrenal abnormality. No focal renal abnormality. No stones or hydronephrosis. Urinary bladder is unremarkable. Stomach/Bowel: Sigmoid diverticulosis. Inflammatory stranding around the sigmoid colon compatible with active diverticulitis. Gastric lap band noted in place. No complicating feature. Stomach and small bowel decompressed. Vascular/Lymphatic: Aortoiliac atherosclerosis. No evidence of aneurysm or adenopathy. Reproductive: Uterus and adnexa unremarkable.  No mass. Other: No free fluid or free air. Musculoskeletal: No acute bony abnormality. IMPRESSION: Sigmoid diverticulosis. Slight inflammatory stranding around the mid sigmoid colon compatible with active diverticulitis. Ancillary findings as above. Electronically Signed   By: Rolm Baptise M.D.   On: 10/26/2020 00:56   DG Chest Port 1 View  Result Date: 10/26/2020 CLINICAL DATA:  Fever EXAM: PORTABLE CHEST 1 VIEW COMPARISON:  04/25/2020 FINDINGS: Cardiomegaly, vascular congestion.  No confluent opacities, effusions or overt edema. IMPRESSION: Cardiomegaly, vascular congestion. Electronically Signed   By: Rolm Baptise M.D.   On: 10/26/2020 00:10     LOS: 0 days   George Hugh, MD [facility]  10/26/2020, 4:04 PM

## 2020-10-26 NOTE — Discharge Instructions (Signed)
Information on my medicine - ELIQUIS (apixaban)  This medication education was reviewed with me or my healthcare representative as part of my discharge preparation.    Why was Eliquis prescribed for you? Eliquis was prescribed for you to reduce the risk of a blood clot forming that can cause a stroke if you have a medical condition called atrial fibrillation (a type of irregular heartbeat).  What do You need to know about Eliquis ? Take your Eliquis TWICE DAILY - one tablet in the morning and one tablet in the evening with or without food. If you have difficulty swallowing the tablet whole please discuss with your pharmacist how to take the medication safely.  Take Eliquis exactly as prescribed by your doctor and DO NOT stop taking Eliquis without talking to the doctor who prescribed the medication.  Stopping may increase your risk of developing a stroke.  Refill your prescription before you run out.  After discharge, you should have regular check-up appointments with your healthcare provider that is prescribing your Eliquis.  In the future your dose may need to be changed if your kidney function or weight changes by a significant amount or as you get older.  What do you do if you miss a dose? If you miss a dose, take it as soon as you remember on the same day and resume taking twice daily.  Do not take more than one dose of ELIQUIS at the same time to make up a missed dose.  Important Safety Information A possible side effect of Eliquis is bleeding. You should call your healthcare provider right away if you experience any of the following: ? Bleeding from an injury or your nose that does not stop. ? Unusual colored urine (red or dark brown) or unusual colored stools (red or black). ? Unusual bruising for unknown reasons. ? A serious fall or if you hit your head (even if there is no bleeding).  Some medicines may interact with Eliquis and might increase your risk of bleeding or  clotting while on Eliquis. To help avoid this, consult your healthcare provider or pharmacist prior to using any new prescription or non-prescription medications, including herbals, vitamins, non-steroidal anti-inflammatory drugs (NSAIDs) and supplements.  This website has more information on Eliquis (apixaban): http://www.eliquis.com/eliquis/home  ===================================================  Atrial Fibrillation    Atrial fibrillation is a type of heartbeat that is irregular or fast. If you have this condition, your heart beats without any order. This makes it hard for your heart to pump blood in a normal way. Atrial fibrillation may come and go, or it may become a long-lasting problem. If this condition is not treated, it can put you at higher risk for stroke, heart failure, and other heart problems.  What are the causes? This condition may be caused by diseases that damage the heart. They include:  High blood pressure.  Heart failure.  Heart valve disease.  Heart surgery. Other causes include:  Diabetes.  Thyroid disease.  Being overweight.  Kidney disease. Sometimes the cause is not known.  What increases the risk? You are more likely to develop this condition if:  You are older.  You smoke.  You exercise often and very hard.  You have a family history of this condition.  You are a man.  You use drugs.  You drink a lot of alcohol.  You have lung conditions, such as emphysema, pneumonia, or COPD.  You have sleep apnea.   What are the signs or symptoms? Common symptoms of  this condition include:  A feeling that your heart is beating very fast.  Chest pain or discomfort.  Feeling short of breath.  Suddenly feeling light-headed or weak.  Getting tired easily during activity.  Fainting.  Sweating. In some cases, there are no symptoms.  How is this treated? Treatment for this condition depends on underlying conditions and how you feel  when you have atrial fibrillation. They include: 1. Medicines to: ? Prevent blood clots. ? Treat heart rate or heart rhythm problems. 2. Using devices, such as a pacemaker, to correct heart rhythm problems. 3. Doing surgery to remove the part of the heart that sends bad signals. 4. Closing an area where clots can form in the heart (left atrial appendage). In some cases, your doctor will treat other underlying conditions.  Follow these instructions at home:  Medicines 1. Take over-the-counter and prescription medicines only as told by your doctor. 2. Do not take any new medicines without first talking to your doctor. 3. If you are taking blood thinners: ? Talk with your doctor before you take any medicines that have aspirin or NSAIDs, such as ibuprofen, in them. ? Take your medicine exactly as told by your doctor. Take it at the same time each day. ? Avoid activities that could hurt or bruise you. Follow instructions about how to prevent falls. ? Wear a bracelet that says you are taking blood thinners. Or, carry a card that lists what medicines you take. Lifestyle          Do not use any products that have nicotine or tobacco in them. These include cigarettes, e-cigarettes, and chewing tobacco. If you need help quitting, ask your doctor.  Eat heart-healthy foods. Talk with your doctor about the right eating plan for you.  Exercise regularly as told by your doctor.  Do not drink alcohol.  Lose weight if you are overweight.  Do not use drugs, including cannabis.  General instructions  If you have a condition that causes breathing to stop for a short period of time (apnea), treat it as told by your doctor.  Keep a healthy weight. Do not use diet pills unless your doctor says they are safe for you. Diet pills may make heart problems worse.  Keep all follow-up visits as told by your doctor. This is important.  Contact a doctor if:  You notice a change in the speed, rhythm,  or strength of your heartbeat.  You are taking a blood-thinning medicine and you get more bruising.  You get tired more easily when you move or exercise.  You have a sudden change in weight.  Get help right away if:    1. You have pain in your chest or your belly (abdomen). 2. You have trouble breathing. 3. You have side effects of blood thinners, such as blood in your vomit, poop (stool), or pee (urine), or bleeding that cannot stop. 4. You have any signs of a stroke. "BE FAST" is an easy way to remember the main warning signs: ? B - Balance. Signs are dizziness, sudden trouble walking, or loss of balance. ? E - Eyes. Signs are trouble seeing or a change in how you see. ? F - Face. Signs are sudden weakness or loss of feeling in the face, or the face or eyelid drooping on one side. ? A - Arms. Signs are weakness or loss of feeling in an arm. This happens suddenly and usually on one side of the body. ? S - Speech. Signs  are sudden trouble speaking, slurred speech, or trouble understanding what people say. ? T - Time. Time to call emergency services. Write down what time symptoms started. 5. You have other signs of a stroke, such as: ? A sudden, very bad headache with no known cause. ? Feeling like you may vomit (nausea). ? Vomiting. ? A seizure.  These symptoms may be an emergency. Do not wait to see if the symptoms will go away. Get medical help right away. Call your local emergency services (911 in the [place] Do not drive yourself to the hospital. Summary  Atrial fibrillation is a type of heartbeat that is irregular or fast.  You are at higher risk of this condition if you smoke, are older, have diabetes, or are overweight.  Follow your doctor's instructions about medicines, diet, exercise, and follow-up visits.  Get help right away if you have signs or symptoms of a stroke.  Get help right away if you cannot catch your breath, or you have chest pain or discomfort. This  information is not intended to replace advice given to you by your health care provider. Make sure you discuss any questions you have with your health care provider. Document Revised: 12/20/2018 Document Reviewed: 12/20/2018 Elsevier Patient Education  [place].

## 2020-10-26 NOTE — Progress Notes (Signed)
ANTICOAGULATION CONSULT NOTE - Initial Consult  Pharmacy Consult for heparin Indication: atrial fibrillation  Allergies  Allergen Reactions  . Codeine Phosphate Shortness Of Breath  . Morphine And Related Nausea And Vomiting  . Amoxicillin Nausea And Vomiting  . Penicillins Nausea And Vomiting    Has patient had a PCN reaction causing immediate rash, facial/tongue/throat swelling, SOB or lightheadedness with hypotension:YES Has patient had a PCN reaction causing severe rash involving mucus membranes or skin necrosis: NO Has patient had a PCN reaction that required hospitalization NO Has patient had a PCN reaction occurring within the last 10 years: NO If all of the above answers are "NO", then may proceed with Cephalosporin use.  . Lidocaine Rash    Reaction to lidocaine patch    Patient Measurements: Height: 5\' 3"  (160 cm) Weight: 98.4 kg (217 lb) IBW/kg (Calculated) : 52.4 Heparin Dosing Weight: 75.4 kg  Vital Signs: Temp: 98.8 F (37.1 C) (04/17 1522) Temp Source: Oral (04/17 1522) BP: 115/49 (04/17 1522) Pulse Rate: 70 (04/17 1522)  Labs: Recent Labs    10/25/20 2053 10/25/20 2220 10/26/20 0546 10/26/20 0744 10/26/20 1437  HGB 14.0  --  11.7*  --   --   HCT 42.4  --  35.4*  --   --   PLT 485*  --  381  --   --   HEPARINUNFRC  --   --   --   --  0.38  CREATININE  --  0.75  --  0.67  --   TROPONINIHS  --   --   --  17  --     Estimated Creatinine Clearance: 74.2 mL/min (by C-G formula based on SCr of 0.67 mg/dL).   Medical History: Past Medical History:  Diagnosis Date  . CAD (coronary artery disease)    a. PTCA LAD 2001/cath 2002-prox and mid LAD stents patent, PTCA ostium Dx 1 b. cath 03/2016: patent LAD stent with less than 40% in-stent restenosis, 10-30% stenosis of D1 and distal LAD with continued medical management recommended.  . Candida esophagitis ([place])   . Carotid artery disease ([place])    Doppler September, 2011, 0-39% bilateral disease mild  .  Chronic diastolic CHF (congestive heart failure) ([place])   . Edema   . Emotional stress reaction    8/11  . HTN (hypertension)   . Hyperlipidemia   . Leg pain    July, 2012, Arterial Dopplers March 08, 2011 normal  . 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 PONV (postoperative nausea and vomiting)   . Rheumatoid arthritis ([place])   . Right rotator cuff tear 11/16/2013  . Stroke [facility])    mini stroke in past ???      Assessment: 70 yo W admitted for Afib w/ RVR, not on anticoagulation PTA.  Patient was started on heparin, now transitioning to apixaban.  First HL 0.38 at goal. Apixaban already ordered to start.   Goal of Therapy:  Heparin level 0.3-0.7 units/ml Monitor platelets by anticoagulation protocol: Yes   Plan:  Stop heparin Start apixaban per MD  Monitor for bleeding complications  Benetta Spar, PharmD, BCPS, BCCP Clinical Pharmacist  Please check AMION for all [place] phone numbers After 10:00 PM, call [place] [phone]

## 2020-10-26 NOTE — H&P (Signed)
History and Physical    Cindy Holmes [id number]:[id number] DOB: 1950-09-08 DOA: 10/25/2020  PCP: Redmond School, MD  Patient coming from: Home.  Chief Complaint: Abdominal pain with nausea vomiting and diarrhea.  HPI: Cindy Holmes is a 70 y.o. female with history of CAD status post stenting, diastolic CHF, rheumatoid arthritis and lupus on immunosuppressants, hypertension prior CVA has been experiencing nausea vomiting and diarrhea with abdominal discomfort for the last 5 days.  Pain is mostly in the lower quadrants.  Multiple episodes of diarrhea and vomiting unable to keep anything.  Patient has lost at least 16 pounds in the last 5 days.  Denies any blood in the vomitus or diarrhea.  About 2 weeks ago patient had taken a course of antibiotics for upper respiratory tract infection.  Denies any sick contacts.  ED Course: In the ER patient is Covid negative was found to be in A. fib with RVR was started on Cardizem infusion.  CT abdomen pelvis shows inflammation of the sigmoid colon concerning for diverticulitis was started on empiric antibiotics.  Labs show potassium of 5.6 WBC of 15.8.  AST and ALT mildly elevated.  Patient admitted for new onset A. fib with RVR on Cardizem infusion and acute diverticulitis.  Review of Systems: As per HPI, rest all negative.   Past Medical History:  Diagnosis Date  . CAD (coronary artery disease)    a. PTCA LAD 2001/cath 2002-prox and mid LAD stents patent, PTCA ostium Dx 1 b. cath 03/2016: patent LAD stent with less than 40% in-stent restenosis, 10-30% stenosis of D1 and distal LAD with continued medical management recommended.  . Candida esophagitis ([place])   . Carotid artery disease ([place])    Doppler September, 2011, 0-39% bilateral disease mild  . Chronic diastolic CHF (congestive heart failure) ([place])   . Edema   . Emotional stress reaction    8/11  . HTN (hypertension)   . Hyperlipidemia   . Leg pain    July, 2012, Arterial Dopplers March 08, 2011 normal  . 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 PONV (postoperative nausea and vomiting)   . Rheumatoid arthritis ([place])   . Right rotator cuff tear 11/16/2013  . Stroke [facility])    mini stroke in past ???    Past Surgical History:  Procedure Laterality Date  . CARDIAC CATHETERIZATION     with stent placement in 2001  . CARDIAC CATHETERIZATION N/A 03/12/2016   Procedure: Left Heart Cath and Coronary Angiography;  Surgeon: Nelva Bush, MD;  Location: [place] [facility];  Service: Cardiovascular;  Laterality: N/A;  . CARDIAC CATHETERIZATION N/A 03/12/2016   Procedure: Intravascular Pressure Wire/FFR Study;  Surgeon: Nelva Bush, MD;  Location: [place] [facility];  Service: Cardiovascular;  Laterality: N/A;  . CHOLECYSTECTOMY    . CORONARY ANGIOPLASTY     2002  . ESOPHAGOGASTRODUODENOSCOPY N/A 12/03/2016   Procedure: ESOPHAGOGASTRODUODENOSCOPY (EGD);  Surgeon: Carol Ada, MD;  Location: [place];  Service: Endoscopy;  Laterality: N/A;  . laproscopic adjustable gastric  banding     with APS standard system.   . OPEN REDUCTION INTERNAL FIXATION (ORIF) DISTAL RADIAL FRACTURE Right 05/15/2018   Procedure: OPEN REDUCTION INTERNAL FIXATION (ORIF) DISTAL RADIAL FRACTURE;  Surgeon: Shona Needles, MD;  Location: [place];  Service: Orthopedics;  Laterality: Right;  . ORIF ANKLE FRACTURE Right 05/15/2018   Procedure: OPEN REDUCTION  INTERNAL FIXATION (ORIF) ANKLE FRACTURE;  Surgeon: Shona Needles, MD;  Location: [place];  Service: Orthopedics;  Laterality: Right;  . ORIF TIBIA PLATEAU Right 05/15/2018   Procedure: OPEN REDUCTION INTERNAL FIXATION (ORIF) TIBIAL PLATEAU;  Surgeon: Shona Needles, MD;  Location: [place];  Service: Orthopedics;  Laterality: Right;  . Pt has 3 stents     sept 2001  . SHOULDER ARTHROSCOPY WITH SUBACROMIAL DECOMPRESSION, ROTATOR CUFF REPAIR AND BICEP  TENDON REPAIR Right 11/16/2013   Procedure: RIGHT SHOULDER ARTHROSCOPY, EXTENSIVE DEBRIDEMENT, ROTATOR CUFF REPAIR ;  Surgeon: [name], MD;  Location: [place];  Service: Orthopedics;  Laterality: Right;  . TUBAL LIGATION       reports that she has never smoked. She has never used smokeless tobacco. She reports that she does not drink alcohol and does not use drugs.  Allergies  Allergen Reactions  . Codeine Phosphate Shortness Of Breath  . Morphine And Related Nausea And Vomiting  . Amoxicillin Nausea And Vomiting  . Penicillins Nausea And Vomiting    Has patient had a PCN reaction causing immediate rash, facial/tongue/throat swelling, SOB or lightheadedness with hypotension:YES Has patient had a PCN reaction causing severe rash involving mucus membranes or skin necrosis: NO Has patient had a PCN reaction that required hospitalization NO Has patient had a PCN reaction occurring within the last 10 years: NO If all of the above answers are "NO", then may proceed with Cephalosporin use.  . Lidocaine Rash    Reaction to lidocaine patch    Family History  Problem Relation Age of Onset  . Heart attack Mother   . Heart disease Mother   . Diabetes Brother   . Heart disease Brother   . Diabetes Sister   . Arthritis/Rheumatoid Father   . Cirrhosis Father        hepatic cirrhosis  . Heart disease Maternal Aunt   . Heart attack Maternal Aunt   . Throat cancer Maternal Aunt   . Heart attack Maternal Uncle   . Heart disease Brother   . Cancer Other        family history  . Heart attack Other        family history  . Colon cancer Maternal Grandmother   . Throat cancer Maternal Grandfather     Prior to Admission medications   Medication Sig Start Date End Date Taking? Authorizing Provider  acetaminophen (TYLENOL) 500 MG tablet Take 1,000 mg by mouth every 6 (six) hours as needed for headache (pain).   Yes [provider]  allopurinol (ZYLOPRIM) 100 MG  tablet Take 400 mg by mouth at bedtime.   Yes [provider]  aspirin EC 81 MG tablet Take 81 mg by mouth at bedtime. Swallow whole.   Yes [provider]  atorvastatin (LIPITOR) 40 MG tablet Take 2 tablets (80 mg total) by mouth daily Patient taking differently: Take 80 mg by mouth at bedtime. 11/21/19  Yes Weaver, Scott T, PA-C  bisacodyl (DULCOLAX) 5 MG EC tablet Take 5 mg by mouth at bedtime as needed for moderate constipation.   Yes [provider]  clopidogrel (PLAVIX) 75 MG tablet TAKE 1 TABLET BY MOUTH  DAILY Patient taking differently: Take 75 mg by mouth at bedtime. 08/19/20  Yes Dorothy Spark, MD  diphenhydramine-acetaminophen (TYLENOL PM) 25-500 MG TABS tablet Take 12 tablets by mouth at bedtime.   Yes [provider]  doxycycline (VIBRAMYCIN) 100 MG capsule Take 100 mg by mouth 2 (  two) times daily. 10/16/20  Yes [provider]  ezetimibe (ZETIA) 10 MG tablet Take 1 tablet (10 mg total) by mouth daily. Please make yearly appt with Dr. Meda Coffee for May 2022 for future refills. Thank you 1st attempt Patient taking differently: Take 10 mg by mouth at bedtime. Please make yearly appt with Dr. Meda Coffee for May 2022 for future refills. Thank you 1st attempt 08/28/20  Yes Dorothy Spark, MD  folic acid (FOLVITE) 1 MG tablet Take 1 mg by mouth at bedtime. 03/05/14  Yes [provider]  furosemide (LASIX) 40 MG tablet TAKE 1 TABLET BY MOUTH  DAILY AND MAY TAKE 1  ADDITIONAL TABLET DAILY AS  NEEDED FOR SWELLING Patient taking differently: Take 80 mg by mouth at bedtime. 01/22/20  Yes Dorothy Spark, MD  hydroxychloroquine (PLAQUENIL) 200 MG tablet Take 200 mg by mouth at bedtime. 09/03/20  Yes [provider]  levocetirizine (XYZAL) 5 MG tablet Take 5 mg by mouth at bedtime. 04/04/18  Yes [provider]  Melatonin 3 MG TABS Take 3 mg by mouth at bedtime as needed (sleep).   Yes [provider]  methotrexate  (RHEUMATREX) 2.5 MG tablet Take 10 mg by mouth every Sunday. 08/08/20  Yes [provider]  metoprolol tartrate (LOPRESSOR) 50 MG tablet Take 0.5 tablets (25 mg total) by mouth 2 (two) times daily. Please make yearly appt with Dr. Meda Coffee for May 2022 before anymore refills. Thank you 1st attempt Patient taking differently: Take 100 mg by mouth at bedtime. Please make yearly appt with Dr. Meda Coffee for May 2022 before anymore refills. Thank you 1st attempt 08/28/20  Yes Dorothy Spark, MD  nitroGLYCERIN (NITROSTAT) 0.4 MG SL tablet DISSOLVE 1 TABLET UNDER THE TONGUE EVERY 5 MINUTES AS  NEEDED (NOT TO EXCEED 3  TABLETS IN 15 MINS, CALL  911 IF CHEST PAIN PERSISTS Patient taking differently: Place 0.4 mg under the tongue every 5 (five) minutes as needed for chest pain (not to exceed 3 tablets in 15 minutes, call 911 if chest pain persists). 04/25/20  Yes Dorothy Spark, MD  Omega-3 Fatty Acids (FISH OIL) 1000 MG CAPS Take 1,000 mg by mouth at bedtime.   Yes [provider]  predniSONE (DELTASONE) 5 MG tablet Take 5 mg by mouth every morning. 09/03/20  Yes [provider]  ramipril (ALTACE) 2.5 MG capsule TAKE 1 CAPSULE BY MOUTH  DAILY Patient taking differently: Take 2.5 mg by mouth at bedtime. 08/19/20  Yes Dorothy Spark, MD  valACYclovir (VALTREX) 1000 MG tablet Take 4,000 mg by mouth once as needed (fever blisters). 10/13/20  Yes [provider]  zolpidem (AMBIEN) 5 MG tablet Take 5 mg by mouth at bedtime. 10/13/20  Yes [provider]    Physical Exam: Constitutional: Moderately built and nourished. Vitals:   10/26/20 0143 10/26/20 0145 10/26/20 0215 10/26/20 0230  BP: (!) 151/54 138/74 133/61 128/71  Pulse: (!) 104 98 75 63  Resp: 16 (!) 22 19 19   Temp:      TempSrc:      SpO2: 98% 96% 95% 95%  Weight:      Height:       Eyes: Anicteric no pallor. ENMT: No discharge from the ears eyes nose or mouth. Neck: No mass felt.  No neck  rigidity. Respiratory: No rhonchi or crepitations. Cardiovascular: S1-S2 heard. Abdomen: Mild tenderness in the lower quadrants on both sides.  No rebound tenderness. Musculoskeletal: No edema. Skin: No rash. Neurologic: Alert  awake oriented to time place and person.  Moves all extremities. Psychiatric: Appears normal.  Normal affect.   Labs on Admission: I have personally reviewed following labs and imaging studies  CBC: Recent Labs  Lab 10/25/20 2053  WBC 15.8*  NEUTROABS 12.6*  HGB 14.0  HCT 42.4  MCV 90.6  PLT 270*   Basic Metabolic Panel: Recent Labs  Lab 10/25/20 2220  NA 130*  K 5.6*  CL 105  CO2 17*  GLUCOSE 120*  BUN 8  CREATININE 0.75  CALCIUM 8.0*   GFR: Estimated Creatinine Clearance: 74.2 mL/min (by C-G formula based on SCr of 0.75 mg/dL). Liver Function Tests: Recent Labs  Lab 10/25/20 2220  AST 63*  ALT 54*  ALKPHOS 70  BILITOT 2.4*  PROT 5.9*  ALBUMIN 2.8*   Recent Labs  Lab 10/25/20 2220  LIPASE 34   No results for input(s): AMMONIA in the last 168 hours. Coagulation Profile: No results for input(s): INR, PROTIME in the last 168 hours. Cardiac Enzymes: No results for input(s): CKTOTAL, CKMB, CKMBINDEX, TROPONINI in the last 168 hours. BNP (last 3 results) No results for input(s): PROBNP in the last 8760 hours. HbA1C: No results for input(s): HGBA1C in the last 72 hours. CBG: No results for input(s): GLUCAP in the last 168 hours. Lipid Profile: No results for input(s): CHOL, HDL, LDLCALC, TRIG, CHOLHDL, LDLDIRECT in the last 72 hours. Thyroid Function Tests: No results for input(s): TSH, T4TOTAL, FREET4, T3FREE, THYROIDAB in the last 72 hours. Anemia Panel: No results for input(s): VITAMINB12, FOLATE, FERRITIN, TIBC, IRON, RETICCTPCT in the last 72 hours. Urine analysis:    Component Value Date/Time   COLORURINE YELLOW 12/11/2018 1135   APPEARANCEUR CLEAR 12/11/2018 1135   LABSPEC 1.034 (H) 12/11/2018 1135   PHURINE 5.0  12/11/2018 1135   GLUCOSEU NEGATIVE 12/11/2018 1135   HGBUR NEGATIVE 12/11/2018 1135   BILIRUBINUR NEGATIVE 12/11/2018 1135   KETONESUR NEGATIVE 12/11/2018 1135   PROTEINUR NEGATIVE 12/11/2018 1135   UROBILINOGEN 0.2 12/28/2006 0943   NITRITE NEGATIVE 12/11/2018 1135   LEUKOCYTESUR NEGATIVE 12/11/2018 1135   Sepsis Labs: @LABRCNTIP (procalcitonin:4,lacticidven:4) ) Recent Results (from the past 240 hour(s))  Resp Panel by RT-PCR (Flu A&B, Covid) Nasopharyngeal Swab     Status: None   Collection Time: 10/26/20  1:16 AM   Specimen: Nasopharyngeal Swab; Nasopharyngeal(NP) swabs in vial transport medium  Result Value Ref Range Status   SARS Coronavirus 2 by RT PCR NEGATIVE NEGATIVE Final    Comment: (NOTE) SARS-CoV-2 target nucleic acids are NOT DETECTED.  The SARS-CoV-2 RNA is generally detectable in upper respiratory specimens during the acute phase of infection. The lowest concentration of SARS-CoV-2 viral copies this assay can detect is 138 copies/mL. A negative result does not preclude SARS-Cov-2 infection and should not be used as the sole basis for treatment or other patient management decisions. A negative result may occur with  improper specimen collection/handling, submission of specimen other than nasopharyngeal swab, presence of viral mutation(s) within the areas targeted by this assay, and inadequate number of viral copies(<138 copies/mL). A negative result must be combined with clinical observations, patient history, and epidemiological information. The expected result is Negative.  Fact Sheet for Patients:  EntrepreneurPulse.com.au  Fact Sheet for Healthcare Providers:  IncredibleEmployment.be  This test is no t yet approved or cleared by the [place] FDA and  has been authorized for detection and/or diagnosis of SARS-CoV-2 by FDA under an Emergency Use Authorization (EUA). This EUA will remain  in effect (meaning this  test  can be used) for the duration of the COVID-19 declaration under Section 564(b)(1) of the Act, 21 U.S.C.section 360bbb-3(b)(1), unless the authorization is terminated  or revoked sooner.       Influenza A by PCR NEGATIVE NEGATIVE Final   Influenza B by PCR NEGATIVE NEGATIVE Final    Comment: (NOTE) The Xpert Xpress SARS-CoV-2/FLU/RSV plus assay is intended as an aid in the diagnosis of influenza from Nasopharyngeal swab specimens and should not be used as a sole basis for treatment. Nasal washings and aspirates are unacceptable for Xpert Xpress SARS-CoV-2/FLU/RSV testing.  Fact Sheet for Patients: EntrepreneurPulse.com.au  Fact Sheet for Healthcare Providers: IncredibleEmployment.be  This test is not yet approved or cleared by the [place] FDA and has been authorized for detection and/or diagnosis of SARS-CoV-2 by FDA under an Emergency Use Authorization (EUA). This EUA will remain in effect (meaning this test can be used) for the duration of the COVID-19 declaration under Section 564(b)(1) of the Act, 21 U.S.C. section 360bbb-3(b)(1), unless the authorization is terminated or revoked.  Performed at [facility], [place] [address] [place], [place] [address]      Radiological Exams on Admission: CT ABDOMEN PELVIS W CONTRAST  Result Date: 10/26/2020 CLINICAL DATA:  Abdominal pain EXAM: CT ABDOMEN AND PELVIS WITH CONTRAST TECHNIQUE: Multidetector CT imaging of the abdomen and pelvis was performed using the standard protocol following bolus administration of intravenous contrast. CONTRAST:  118mL OMNIPAQUE IOHEXOL 300 MG/ML  SOLN COMPARISON:  05/14/2018 FINDINGS: Lower chest: Coronary artery calcifications.  No acute abnormality. Hepatobiliary: No focal liver abnormality is seen. Status post cholecystectomy. No biliary dilatation. Pancreas: No focal abnormality or ductal dilatation. Spleen: No focal abnormality.  Normal size.  Adrenals/Urinary Tract: No adrenal abnormality. No focal renal abnormality. No stones or hydronephrosis. Urinary bladder is unremarkable. Stomach/Bowel: Sigmoid diverticulosis. Inflammatory stranding around the sigmoid colon compatible with active diverticulitis. Gastric lap band noted in place. No complicating feature. Stomach and small bowel decompressed. Vascular/Lymphatic: Aortoiliac atherosclerosis. No evidence of aneurysm or adenopathy. Reproductive: Uterus and adnexa unremarkable.  No mass. Other: No free fluid or free air. Musculoskeletal: No acute bony abnormality. IMPRESSION: Sigmoid diverticulosis. Slight inflammatory stranding around the mid sigmoid colon compatible with active diverticulitis. Ancillary findings as above. Electronically Signed   By: Rolm Baptise M.D.   On: 10/26/2020 00:56   DG Chest Port 1 View  Result Date: 10/26/2020 CLINICAL DATA:  Fever EXAM: PORTABLE CHEST 1 VIEW COMPARISON:  04/25/2020 FINDINGS: Cardiomegaly, vascular congestion. No confluent opacities, effusions or overt edema. IMPRESSION: Cardiomegaly, vascular congestion. Electronically Signed   By: Rolm Baptise M.D.   On: 10/26/2020 00:10    EKG: Independently reviewed.  A. fib with RVR.  Assessment/Plan Principal Problem:   Diverticulitis Active Problems:   Essential hypertension   Rheumatoid arthritis ([place])   Coronary artery disease involving native coronary artery of native heart without angina pectoris   Chronic diastolic CHF (congestive heart failure) (HCC)   Atrial fibrillation with RVR (HCC)   Acute diverticulitis    1. Acute sigmoid diverticulitis -patient has been placed on empiric antibiotics.  Given that patient has recent antibiotic use will check stool studies.  We will keep patient n.p.o. for now start diet in the morning if patient's nausea vomiting improves. 2. New onset A. fib with RVR on Cardizem infusion.  Patient's chads 2 vasc score is at least 2 and has been started on heparin for  now.  Will trend cardiac markers check 2D echo check TSH.  Consult cardiology. 3. CAD status post PCI denies any chest pain.  Will trend cardiac markers presently on heparin.  NPO. 4. Chronic diastolic CHF closely monitor respiratory status. 5. History of rheumatoid arthritis and lupus on immunosuppressant.  Patient takes chronic steroids prednisone 5 mg daily we will keep patient stress dose steroids.  Also takes methotrexate and hydroxychloroquine presently n.p.o. restart once patient can take orally. 6. Mildly elevated LFTs closely monitor. 7. Hypertension presently on Cardizem infusion.  Since patient has acute sigmoid diverticulitis with new onset A. fib with RVR and multiple comorbidities will need close monitoring for any further worsening in inpatient status.   DVT prophylaxis: Heparin infusion. Code Status: Full code. Family Communication: Discussed with patient. Disposition Plan: Home. Consults called: Cardiology. Admission status: Inpatient.   Rise Patience MD [facility] Pager [phone].  If 7PM-7AM, please contact night-coverage [email or web address] Password [facility]  10/26/2020, 4:44 AM

## 2020-10-26 NOTE — Progress Notes (Signed)
ANTICOAGULATION CONSULT NOTE - Initial Consult  Pharmacy Consult for heparin Indication: atrial fibrillation  Allergies  Allergen Reactions  . Codeine Phosphate Shortness Of Breath  . Morphine And Related Nausea And Vomiting  . Amoxicillin Nausea And Vomiting  . Penicillins Nausea And Vomiting    Has patient had a PCN reaction causing immediate rash, facial/tongue/throat swelling, SOB or lightheadedness with hypotension:YES Has patient had a PCN reaction causing severe rash involving mucus membranes or skin necrosis: NO Has patient had a PCN reaction that required hospitalization NO Has patient had a PCN reaction occurring within the last 10 years: NO If all of the above answers are "NO", then may proceed with Cephalosporin use.  . Lidocaine Rash    Reaction to lidocaine patch    Patient Measurements: Height: 5\' 3"  (160 cm) Weight: 98.4 kg (217 lb) IBW/kg (Calculated) : 52.4 Heparin Dosing Weight: 75.4 kg  Vital Signs: Temp: 98.6 F (37 C) (04/17 0446) Temp Source: Oral (04/17 0446) BP: 157/75 (04/17 0446) Pulse Rate: 99 (04/17 0446)  Labs: Recent Labs    10/25/20 2053 10/25/20 2220  HGB 14.0  --   HCT 42.4  --   PLT 485*  --   CREATININE  --  0.75    Estimated Creatinine Clearance: 74.2 mL/min (by C-G formula based on SCr of 0.75 mg/dL).   Medical History: Past Medical History:  Diagnosis Date  . CAD (coronary artery disease)    a. PTCA LAD 2001/cath 2002-prox and mid LAD stents patent, PTCA ostium Dx 1 b. cath 03/2016: patent LAD stent with less than 40% in-stent restenosis, 10-30% stenosis of D1 and distal LAD with continued medical management recommended.  . Candida esophagitis ([place])   . Carotid artery disease ([place])    Doppler September, 2011, 0-39% bilateral disease mild  . Chronic diastolic CHF (congestive heart failure) ([place])   . Edema   . Emotional stress reaction    8/11  . HTN (hypertension)   . Hyperlipidemia   . Leg pain    July, 2012,  Arterial Dopplers March 08, 2011 normal  . 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 PONV (postoperative nausea and vomiting)   . Rheumatoid arthritis ([place])   . Right rotator cuff tear 11/16/2013  . Stroke [facility])    mini stroke in past ???    Medications:  See medication history  Assessment: 70 yo lady to start heparin for afib.  She was not on anticoagulation PTA.  Hg 14, PTLC 485 Goal of Therapy:  Heparin level 0.3-0.7 units/ml Monitor platelets by anticoagulation protocol: Yes   Plan:  Heparin bolus 3500 units and drip at 1200 units/hr Check heparin level in 6-8 hours Daily HL and CBC Monitor for bleeding complications  Cindy Holmes 10/26/2020,4:55 AM

## 2020-10-26 NOTE — Consult Note (Signed)
Cardiology Consultation:   Patient ID: [name] MRN: [id number]; DOB: Nov 15, 1950  Admit date: 10/25/2020 Date of Consult: 10/26/2020  PCP:  [facility]  Cardiologist:  Ena Dawley, MD/Carole Doner Margaretann Loveless MD Advanced Practice Provider:  No care team member to display Electrophysiologist:  None     Patient Profile:   Cindy Holmes is a 70 y.o. female with a hx of CAD, heart failure with preserved ejection fraction, rheumatoid arthritis and lupus on immunosuppressants, hypertension with a prior CVA, who is being seen today for the evaluation of atrial fibrillation in the setting of likely gastroenteritis at the request of Dr. Lavera Guise.  History of Present Illness:   Cindy Holmes is a 70 year old female with a history of coronary artery disease with bare-metal stent to the LAD and balloon angioplasty of first diagonal in 2001, then status post balloon angioplasty to the same diagonal in 2002, she underwent coronary angiography in September 2017 with moderate in-stent restenosis of the proximal LAD but FFR negative, and had a Myoview in 2020 which was low risk with no ischemia.  She has been noted to have heart failure with preserved ejection fraction and last echocardiogram in 2018 showed hyperdynamic LV function with grade 2 diastolic dysfunction per report.  She also has a history of hypertension, hyperlipidemia, obesity, prior CVA, lupus.  She has had a normal renal ultrasound.  Most recently seen in cardiology by [name], PA-C in May 2021, and is a patient of Dr. Ena Dawley otherwise.  At that time her metoprolol was recommended to be taken twice a day as she was only taking it once a day, and she was felt to have stable blood pressure on her medication regimen.  She did note a sense of rapid palpitations and an event monitor was ordered but does not appear to have been completed.  On chart review of cardiovascular notes, I do not  see a history of atrial fibrillation.  Patient presents with several days of nausea, vomiting, diarrhea and inability to maintain oral intake.  She feels she is lost 16 pounds in the last several days.  Denies hematemesis or hematochezia or melena.  On presentation to the ED noted atrial fibrillation with rapid ventricular response, CT of the abdomen was suspicious for diverticulitis, antibiotics have been initiated.  Pertinent laboratory studies include severe hypokalemia with potassium of 2.5, likely situational in the setting of gastroenteritis.  Sodium 134, creatinine 0.6, troponin high-sensitivity 7> 7> 17.  Hemoglobin 11.7, white blood cells 11,800, platelets 381,000, TSH 1.234  EKG initially shows atrial fibrillation with rapid ventricular response any ST-T wave change suggestive of ischemia.  ECG today shows sinus rhythm with PACs with T wave inversions anteriorly.  Telemetry sinus rhythm with PACs.   Past Medical History:  Diagnosis Date  . CAD (coronary artery disease)    a. PTCA LAD 2001/cath 2002-prox and mid LAD stents patent, PTCA ostium Dx 1 b. cath 03/2016: patent LAD stent with less than 40% in-stent restenosis, 10-30% stenosis of D1 and distal LAD with continued medical management recommended.  . Candida esophagitis ([place])   . Carotid artery disease ([place])    Doppler September, 2011, 0-39% bilateral disease mild  . Chronic diastolic CHF (congestive heart failure) ([place])   . Edema   . Emotional stress reaction    8/11  . HTN (hypertension)   . Hyperlipidemia   . Leg pain    July, 2012, Arterial Dopplers March 08, 2011 normal  .  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 PONV (postoperative nausea and vomiting)   . Rheumatoid arthritis ([place])   . Right rotator cuff tear 11/16/2013  . Stroke [facility])    mini stroke in past ???    Past Surgical History:  Procedure  Laterality Date  . CARDIAC CATHETERIZATION     with stent placement in 2001  . CARDIAC CATHETERIZATION N/A 03/12/2016   Procedure: Left Heart Cath and Coronary Angiography;  Surgeon: Nelva Bush, MD;  Location: [place] [facility];  Service: Cardiovascular;  Laterality: N/A;  . CARDIAC CATHETERIZATION N/A 03/12/2016   Procedure: Intravascular Pressure Wire/FFR Study;  Surgeon: Nelva Bush, MD;  Location: [place] [facility];  Service: Cardiovascular;  Laterality: N/A;  . CHOLECYSTECTOMY    . CORONARY ANGIOPLASTY     2002  . ESOPHAGOGASTRODUODENOSCOPY N/A 12/03/2016   Procedure: ESOPHAGOGASTRODUODENOSCOPY (EGD);  Surgeon: Carol Ada, MD;  Location: [place];  Service: Endoscopy;  Laterality: N/A;  . laproscopic adjustable gastric  banding     with APS standard system.   . OPEN REDUCTION INTERNAL FIXATION (ORIF) DISTAL RADIAL FRACTURE Right 05/15/2018   Procedure: OPEN REDUCTION INTERNAL FIXATION (ORIF) DISTAL RADIAL FRACTURE;  Surgeon: Shona Needles, MD;  Location: [place];  Service: Orthopedics;  Laterality: Right;  . ORIF ANKLE FRACTURE Right 05/15/2018   Procedure: OPEN REDUCTION INTERNAL FIXATION (ORIF) ANKLE FRACTURE;  Surgeon: Shona Needles, MD;  Location: [place];  Service: Orthopedics;  Laterality: Right;  . ORIF TIBIA PLATEAU Right 05/15/2018   Procedure: OPEN REDUCTION INTERNAL FIXATION (ORIF) TIBIAL PLATEAU;  Surgeon: Shona Needles, MD;  Location: [place];  Service: Orthopedics;  Laterality: Right;  . Pt has 3 stents     sept 2001  . SHOULDER ARTHROSCOPY WITH SUBACROMIAL DECOMPRESSION, ROTATOR CUFF REPAIR AND BICEP TENDON REPAIR Right 11/16/2013   Procedure: RIGHT SHOULDER ARTHROSCOPY, EXTENSIVE DEBRIDEMENT, ROTATOR CUFF REPAIR ;  Surgeon: [name], MD;  Location: [place];  Service: Orthopedics;  Laterality: Right;  . TUBAL LIGATION       Home Medications:  Prior to Admission medications   Medication Sig Start Date End Date Taking? Authorizing  Provider  acetaminophen (TYLENOL) 500 MG tablet Take 1,000 mg by mouth every 6 (six) hours as needed for headache (pain).   Yes [provider]  allopurinol (ZYLOPRIM) 100 MG tablet Take 400 mg by mouth at bedtime.   Yes [provider]  aspirin EC 81 MG tablet Take 81 mg by mouth at bedtime. Swallow whole.   Yes [provider]  atorvastatin (LIPITOR) 40 MG tablet Take 2 tablets (80 mg total) by mouth daily Patient taking differently: Take 80 mg by mouth at bedtime. 11/21/19  Yes Weaver, Scott T, PA-C  bisacodyl (DULCOLAX) 5 MG EC tablet Take 5 mg by mouth at bedtime as needed for moderate constipation.   Yes [provider]  clopidogrel (PLAVIX) 75 MG tablet TAKE 1 TABLET BY MOUTH  DAILY Patient taking differently: Take 75 mg by mouth at bedtime. 08/19/20  Yes Dorothy Spark, MD  diphenhydramine-acetaminophen (TYLENOL PM) 25-500 MG TABS tablet Take 12 tablets by mouth at bedtime.   Yes [provider]  doxycycline (VIBRAMYCIN) 100 MG capsule Take 100 mg by mouth 2 (two) times daily. 10/16/20  Yes [provider]  ezetimibe (ZETIA) 10 MG tablet Take 1 tablet (10 mg total) by mouth  daily. Please make yearly appt with Dr. Meda Coffee for May 2022 for future refills. Thank you 1st attempt Patient taking differently: Take 10 mg by mouth at bedtime. Please make yearly appt with Dr. Meda Coffee for May 2022 for future refills. Thank you 1st attempt 08/28/20  Yes Dorothy Spark, MD  folic acid (FOLVITE) 1 MG tablet Take 1 mg by mouth at bedtime. 03/05/14  Yes [provider]  furosemide (LASIX) 40 MG tablet TAKE 1 TABLET BY MOUTH  DAILY AND MAY TAKE 1  ADDITIONAL TABLET DAILY AS  NEEDED FOR SWELLING Patient taking differently: Take 80 mg by mouth at bedtime. 01/22/20  Yes Dorothy Spark, MD  hydroxychloroquine (PLAQUENIL) 200 MG tablet Take 200 mg by mouth at bedtime. 09/03/20  Yes [provider]  levocetirizine (XYZAL) 5 MG tablet Take 5  mg by mouth at bedtime. 04/04/18  Yes [provider]  Melatonin 3 MG TABS Take 3 mg by mouth at bedtime as needed (sleep).   Yes [provider]  methotrexate (RHEUMATREX) 2.5 MG tablet Take 10 mg by mouth every Sunday. 08/08/20  Yes [provider]  metoprolol tartrate (LOPRESSOR) 50 MG tablet Take 0.5 tablets (25 mg total) by mouth 2 (two) times daily. Please make yearly appt with Dr. Meda Coffee for May 2022 before anymore refills. Thank you 1st attempt Patient taking differently: Take 100 mg by mouth at bedtime. Please make yearly appt with Dr. Meda Coffee for May 2022 before anymore refills. Thank you 1st attempt 08/28/20  Yes Dorothy Spark, MD  nitroGLYCERIN (NITROSTAT) 0.4 MG SL tablet DISSOLVE 1 TABLET UNDER THE TONGUE EVERY 5 MINUTES AS  NEEDED (NOT TO EXCEED 3  TABLETS IN 15 MINS, CALL  911 IF CHEST PAIN PERSISTS Patient taking differently: Place 0.4 mg under the tongue every 5 (five) minutes as needed for chest pain (not to exceed 3 tablets in 15 minutes, call 911 if chest pain persists). 04/25/20  Yes Dorothy Spark, MD  Omega-3 Fatty Acids (FISH OIL) 1000 MG CAPS Take 1,000 mg by mouth at bedtime.   Yes [provider]  predniSONE (DELTASONE) 5 MG tablet Take 5 mg by mouth every morning. 09/03/20  Yes [provider]  ramipril (ALTACE) 2.5 MG capsule TAKE 1 CAPSULE BY MOUTH  DAILY Patient taking differently: Take 2.5 mg by mouth at bedtime. 08/19/20  Yes Dorothy Spark, MD  valACYclovir (VALTREX) 1000 MG tablet Take 4,000 mg by mouth once as needed (fever blisters). 10/13/20  Yes [provider]  zolpidem (AMBIEN) 5 MG tablet Take 5 mg by mouth at bedtime. 10/13/20  Yes [provider]    Inpatient Medications: Scheduled Meds: . aspirin  81 mg Oral Daily  . metoprolol tartrate  25 mg Oral BID  . predniSONE  10 mg Oral Q breakfast   Continuous Infusions: . [START ON 10/27/2020] cefTRIAXone (ROCEPHIN)  IV    . dextrose 5 % and  0.9% NaCl 75 mL/hr at 10/26/20 0627  . diltiazem (CARDIZEM) infusion 15 mg/hr (10/26/20 0402)  . heparin 1,200 Units/hr (10/26/20 0625)  . metronidazole     PRN Meds: acetaminophen **OR** acetaminophen  Allergies:    Allergies  Allergen Reactions  . Codeine Phosphate Shortness Of Breath  . Morphine And Related Nausea And Vomiting  . Amoxicillin Nausea And Vomiting  . Penicillins Nausea And Vomiting    Has patient had a PCN reaction causing immediate rash, facial/tongue/throat swelling, SOB or lightheadedness with hypotension:YES Has patient had a PCN reaction causing  severe rash involving mucus membranes or skin necrosis: NO Has patient had a PCN reaction that required hospitalization NO Has patient had a PCN reaction occurring within the last 10 years: NO If all of the above answers are "NO", then may proceed with Cephalosporin use.  . Lidocaine Rash    Reaction to lidocaine patch    Social History:   Social History   Socioeconomic History  . Marital status: Married    Spouse name: Not on file  . Number of children: Not on file  . Years of education: Not on file  . Highest education level: Not on file  Occupational History  . Not on file  Tobacco Use  . Smoking status: Never Smoker  . Smokeless tobacco: Never Used  Vaping Use  . Vaping Use: Never used  Substance and Sexual Activity  . Alcohol use: No    Alcohol/week: 0.0 standard drinks  . Drug use: No  . Sexual activity: Never  Other Topics Concern  . Not on file  Social History Narrative  . Not on file   Social Determinants of Health   Financial Resource Strain: Not on file  Food Insecurity: Not on file  Transportation Needs: Not on file  Physical Activity: Not on file  Stress: Not on file  Social Connections: Not on file  Intimate Partner Violence: Not on file    Family History:    Family History  Problem Relation Age of Onset  . Heart attack Mother   . Heart disease Mother   . Diabetes Brother    . Heart disease Brother   . Diabetes Sister   . Arthritis/Rheumatoid Father   . Cirrhosis Father        hepatic cirrhosis  . Heart disease Maternal Aunt   . Heart attack Maternal Aunt   . Throat cancer Maternal Aunt   . Heart attack Maternal Uncle   . Heart disease Brother   . Cancer Other        family history  . Heart attack Other        family history  . Colon cancer Maternal Grandmother   . Throat cancer Maternal Grandfather      ROS:  Please see the history of present illness.   All other ROS reviewed and negative.     Physical Exam/Data:   Vitals:   10/26/20 0215 10/26/20 0230 10/26/20 0446 10/26/20 0814  BP: 133/61 128/71 (!) 157/75 129/62  Pulse: 75 63 99 78  Resp: 19 19 19 18   Temp:   98.6 F (37 C) 98.9 F (37.2 C)  TempSrc:   Oral Oral  SpO2: 95% 95% 98% 97%  Weight:      Height:        Intake/Output Summary (Last 24 hours) at 10/26/2020 0857 Last data filed at 10/26/2020 0409 Gross per 24 hour  Intake 584.5 ml  Output --  Net 584.5 ml   Last 3 Weights 10/25/2020 04/25/2020 11/21/2019  Weight (lbs) 217 lb 225 lb 227 lb  Weight (kg) 98.431 kg 102.059 kg 102.967 kg     Body mass index is 38.44 kg/m.  Constitutional: No acute distress Eyes: sclera non-icteric, normal conjunctiva and lids ENMT: normal dentition, moist mucous membranes Cardiovascular: regular rhythm, normal rate, no murmurs. S1 and S2 normal. Radial pulses normal bilaterally. No jugular venous distention.  Respiratory: clear to auscultation bilaterally GI : normal bowel sounds, soft and nontender. No distention.   MSK: extremities warm, well perfused. No edema.  NEURO: grossly  nonfocal exam, moves all extremities. PSYCH: alert and oriented x 3, normal mood and affect.   EKG:  The EKG was personally reviewed and demonstrates: Sinus rhythm with PACs and T wave inversions anteriorly Telemetry:  Telemetry was personally reviewed and demonstrates: Sinus rhythm with PACs  Relevant CV  Studies: Echo pending  Laboratory Data:  High Sensitivity Troponin:  No results for input(s): TROPONINIHS in the last 720 hours.   Chemistry Recent Labs  Lab 10/25/20 2220  NA 130*  K 5.6*  CL 105  CO2 17*  GLUCOSE 120*  BUN 8  CREATININE 0.75  CALCIUM 8.0*  GFRNONAA >60  ANIONGAP 8    Recent Labs  Lab 10/25/20 2220  PROT 5.9*  ALBUMIN 2.8*  AST 63*  ALT 54*  ALKPHOS 70  BILITOT 2.4*   Hematology Recent Labs  Lab 10/25/20 2053 10/26/20 0546  WBC 15.8* 11.8*  RBC 4.68 3.91  HGB 14.0 11.7*  HCT 42.4 35.4*  MCV 90.6 90.5  MCH 29.9 29.9  MCHC 33.0 33.1  RDW 15.6* 15.6*  PLT 485* 381   BNPNo results for input(s): BNP, PROBNP in the last 168 hours.  DDimer No results for input(s): DDIMER in the last 168 hours.   Radiology/Studies:  CT ABDOMEN PELVIS W CONTRAST  Result Date: 10/26/2020 CLINICAL DATA:  Abdominal pain EXAM: CT ABDOMEN AND PELVIS WITH CONTRAST TECHNIQUE: Multidetector CT imaging of the abdomen and pelvis was performed using the standard protocol following bolus administration of intravenous contrast. CONTRAST:  196mL OMNIPAQUE IOHEXOL 300 MG/ML  SOLN COMPARISON:  05/14/2018 FINDINGS: Lower chest: Coronary artery calcifications.  No acute abnormality. Hepatobiliary: No focal liver abnormality is seen. Status post cholecystectomy. No biliary dilatation. Pancreas: No focal abnormality or ductal dilatation. Spleen: No focal abnormality.  Normal size. Adrenals/Urinary Tract: No adrenal abnormality. No focal renal abnormality. No stones or hydronephrosis. Urinary bladder is unremarkable. Stomach/Bowel: Sigmoid diverticulosis. Inflammatory stranding around the sigmoid colon compatible with active diverticulitis. Gastric lap band noted in place. No complicating feature. Stomach and small bowel decompressed. Vascular/Lymphatic: Aortoiliac atherosclerosis. No evidence of aneurysm or adenopathy. Reproductive: Uterus and adnexa unremarkable.  No mass. Other: No free  fluid or free air. Musculoskeletal: No acute bony abnormality. IMPRESSION: Sigmoid diverticulosis. Slight inflammatory stranding around the mid sigmoid colon compatible with active diverticulitis. Ancillary findings as above. Electronically Signed   By: Rolm Baptise M.D.   On: 10/26/2020 00:56   DG Chest Port 1 View  Result Date: 10/26/2020 CLINICAL DATA:  Fever EXAM: PORTABLE CHEST 1 VIEW COMPARISON:  04/25/2020 FINDINGS: Cardiomegaly, vascular congestion. No confluent opacities, effusions or overt edema. IMPRESSION: Cardiomegaly, vascular congestion. Electronically Signed   By: Rolm Baptise M.D.   On: 10/26/2020 00:10     Assessment and Plan:   Principal Problem:   Diverticulitis Active Problems:   Essential hypertension   Rheumatoid arthritis ([place])   Coronary artery disease involving native coronary artery of native heart without angina pectoris   Chronic diastolic CHF (congestive heart failure) ([facility])   Atrial fibrillation with RVR ([place])   Acute diverticulitis  Patient is currently admitted with diverticulitis and had brief atrial fibrillation with rapid ventricular response which responded to rate control and she has spontaneously converted.  This patients CHA2DS2-VASc Score and unadjusted Ischemic Stroke Rate (% per year) is equal to 9.7 % stroke rate/year from a score of 6 Above score calculated as 1 point each if present [CHF, HTN, DM, Vascular=MI/PAD/Aortic Plaque, Age if 65-74, or Female] Above score calculated as 2 points  each if present [Age > 75, or Stroke/TIA/TE]  CHA2DS2-VASc score of 6 warrants consideration of anticoagulation.  Would recommend starting Eliquis 5 mg twice daily.  Patient is currently on heparin infusion per primary service.  If bleeding risk is felt to be low, would consider continuing her ASA 81 mg daily given history of POBA and BMS but this can be discussed further with patient.   Discussed with primary team, they have discontinued her diltiazem infusion  and plan to continue metoprolol.   Does not appear to be decompensated, recovering from GI illness so no indication for diuresis at this time.     Risk Assessment/Risk Scores:             For questions or updates, please contact [place] Please consult [email or web address] for contact info under    Signed, Elouise Munroe, MD  10/26/2020 8:57 AM

## 2020-10-27 ENCOUNTER — Inpatient Hospital Stay (HOSPITAL_COMMUNITY): Payer: Medicare Other

## 2020-10-27 DIAGNOSIS — I4891 Unspecified atrial fibrillation: Secondary | ICD-10-CM | POA: Diagnosis not present

## 2020-10-27 DIAGNOSIS — K5792 Diverticulitis of intestine, part unspecified, without perforation or abscess without bleeding: Secondary | ICD-10-CM | POA: Diagnosis not present

## 2020-10-27 LAB — CBC WITH DIFFERENTIAL/PLATELET
Abs Immature Granulocytes: 0.13 10*3/uL — ABNORMAL HIGH (ref 0.00–0.07)
Basophils Absolute: 0 10*3/uL (ref 0.0–0.1)
Basophils Relative: 0 %
Eosinophils Absolute: 0.1 10*3/uL (ref 0.0–0.5)
Eosinophils Relative: 2 %
HCT: 32.5 % — ABNORMAL LOW (ref 36.0–46.0)
Hemoglobin: 10.8 g/dL — ABNORMAL LOW (ref 12.0–15.0)
Immature Granulocytes: 1 %
Lymphocytes Relative: 19 %
Lymphs Abs: 1.7 10*3/uL (ref 0.7–4.0)
MCH: 29.6 pg (ref 26.0–34.0)
MCHC: 33.2 g/dL (ref 30.0–36.0)
MCV: 89 fL (ref 80.0–100.0)
Monocytes Absolute: 0.5 10*3/uL (ref 0.1–1.0)
Monocytes Relative: 6 %
Neutro Abs: 6.5 10*3/uL (ref 1.7–7.7)
Neutrophils Relative %: 72 %
Platelets: 320 10*3/uL (ref 150–400)
RBC: 3.65 MIL/uL — ABNORMAL LOW (ref 3.87–5.11)
RDW: 15.5 % (ref 11.5–15.5)
WBC: 9 10*3/uL (ref 4.0–10.5)
nRBC: 0 % (ref 0.0–0.2)

## 2020-10-27 LAB — BASIC METABOLIC PANEL
Anion gap: 7 (ref 5–15)
BUN: <5 mg/dL — ABNORMAL LOW (ref 8–23)
CO2: 21 mmol/L — ABNORMAL LOW (ref 22–32)
Calcium: 8.5 mg/dL — ABNORMAL LOW (ref 8.9–10.3)
Chloride: 106 mmol/L (ref 98–111)
Creatinine, Ser: 0.6 mg/dL (ref 0.44–1.00)
GFR, Estimated: >60 mL/min (ref >60)
Glucose, Bld: 102 mg/dL — ABNORMAL HIGH (ref 70–99)
Potassium: 2.5 mmol/L — CL (ref 3.5–5.1)
Sodium: 134 mmol/L — ABNORMAL LOW (ref 135–145)

## 2020-10-27 LAB — GLUCOSE, CAPILLARY
Glucose-Capillary: 94 mg/dL (ref 70–99)
Glucose-Capillary: 114 mg/dL — ABNORMAL HIGH (ref 70–99)
Glucose-Capillary: 135 mg/dL — ABNORMAL HIGH (ref 70–99)

## 2020-10-27 LAB — MAGNESIUM: Magnesium: 1.5 mg/dL — ABNORMAL LOW (ref 1.7–2.4)

## 2020-10-27 LAB — ECHOCARDIOGRAM COMPLETE
Area-P 1/2: 3.12 cm2
Height: 63 in
S' Lateral: 2.9 cm
Weight: 3616 oz

## 2020-10-27 LAB — C-REACTIVE PROTEIN: CRP: 0.7 mg/dL (ref <1.0)

## 2020-10-27 MED ORDER — LACTATED RINGERS IV SOLN: INTRAVENOUS | Status: AC

## 2020-10-27 MED ORDER — MAGNESIUM SULFATE 2 GM/50ML IV SOLN
2.0000 g | Freq: Once | INTRAVENOUS | Status: AC
  Administered 2020-10-27: 2 g via INTRAVENOUS
  Filled 2020-10-27: qty 50

## 2020-10-27 MED ORDER — POTASSIUM CHLORIDE CRYS ER 10 MEQ PO TBCR
40.0000 meq | EXTENDED_RELEASE_TABLET | Freq: Two times a day (BID) | ORAL | Status: DC
  Administered 2020-10-27 – 2020-10-28 (×3): 40 meq via ORAL
  Filled 2020-10-27: qty 2
  Filled 2020-10-27 (×3): qty 4

## 2020-10-27 MED ORDER — POTASSIUM CHLORIDE 10 MEQ/100ML IV SOLN
10.0000 meq | INTRAVENOUS | Status: AC
  Administered 2020-10-27 (×4): 10 meq via INTRAVENOUS
  Filled 2020-10-27 (×4): qty 100

## 2020-10-27 MED ORDER — SALINE SPRAY 0.65 % NA SOLN
1.0000 | NASAL | Status: DC | PRN
  Filled 2020-10-27: qty 44

## 2020-10-27 NOTE — Progress Notes (Addendum)
PROGRESS NOTE    Cindy Holmes  TKZ:[id number] DOB: [birth year] DOA: 10/25/2020 PCP: Redmond School, MD   Chief Complaint  Patient presents with  . Abdominal Pain  . Vomiting     Brief Narrative: 70 year old female with a PMH of Hypertension, CAD s/p DES, Chronic Diastolic Heart Failure, Paroxysmal Atrial Fibrillation, Rheumatoid Arthritis on chronic steroids, and 2006 episode of diverticulitis who presents to the ED on 4/16 with LLQ  Pain and recurrent nausea, vomiting, and episodes of diarrhea who was found to be in Afib with RVR and HR ~ 160 beats/min.  Abdominal CT showed moderate sigmoid diverticulitis.  Subjective: Patient looks much better today. Abdominal exam is improved. I will advance diet today. There have been no events on telemetry. If she does well, can be discharged tomorrow.   Assessment & Plan: Principal Problem:   Diverticulitis Active Problems:   Essential hypertension   Rheumatoid arthritis ([place])   Coronary artery disease involving native coronary artery of native heart without angina pectoris   Chronic diastolic CHF (congestive heart failure) (HCC)   Atrial fibrillation with RVR (HCC)   Acute diverticulitis  Acute Sigmoid Diverticulitis, uncomplicated, second episode: CT Scan shows moderate sigmoid diverticulitis.  There is no perforation, abscess, obstruction, fistula, or other complication.  There is no rebound tenderness on exam.  CRP level is normal. - Given that stranding is moderate in severity and patient is chronically immunosuppressed, we treated with antibiotics, fluids, anti-emetics, and bowel rest. - On Day 2 of IV Ceftriaxone 1g QD and Flagyl 500 mg Q8h. - Continue LR 100 CC/hr x 10 hours.  - Advance diet today.  If she does ok, I will discharge tomorrow on Cipro and Flagyl x 5 days to complete 7 day course.  Hypokalemia: - Potassium 2.5 mmol/L, replaced.  Hypomagnesium: - Magnesium 1.5 mg/dL, replaced.  Acute on Chronic Atrial  Fibrillation: TSH is normal. - Continue home med Lopressor at higher dose 50 mg BID.  - Continue Eliquis 5 mg BID for anticoagulation. - Echo is pending. - Patient will follow up with Cardiology outpatient.  Essential Hypertension: - BP is stable.  Chronic Diastolic Heart Failure: - Monitor respiratory status while on fluids.  Coronary Artery Disease: - I will discuss with patient starting Aspirin and Statin.  Rheumatoid Arthritis / Chronic Steroid Use: - Hold Methotrexate in the setting of acute infection. - Cortisol level is 65.  I will increase home prednisone dosing to 10 mg daily in the setting of acute infection and decrease to 5 mg daily on discharge.   Diet Order            Diet Heart Room service appropriate? Yes; Fluid consistency: Thin  Diet effective now                 Patient's Body mass index is 40.03 kg/m.   DVT prophylaxis: None.  On Eliquis. Code Status:   Code Status: Full Code  Family Communication: plan of care discussed with patient at bedside.  Status is: Inpatient  Remains inpatient appropriate because:Inpatient level of care appropriate due to severity of illness   Dispo: The patient is from: Home              Anticipated d/c is to: Home              Patient currently is not medically stable to d/c.   Difficult to place patient No    Unresulted Labs (From admission, onward)  Start     Ordered   10/27/20 3662  Basic metabolic panel  Daily,   R     Question:  Specimen collection method  Answer:  Lab=Lab collect   10/26/20 0727   10/27/20 0500  CBC with Differential/Platelet  Daily,   R     Question:  Specimen collection method  Answer:  Lab=Lab collect   10/26/20 0727   10/26/20 1800  Potassium  Once,   R       Question:  Specimen collection method  Answer:  Lab=Lab collect   10/26/20 1420   10/25/20 2307  Gastrointestinal Panel by PCR , Stool  (Gastrointestinal Panel by PCR, Stool                                                                                                                                                      **Does Not include CLOSTRIDIUM DIFFICILE testing. **If CDIFF testing is needed, place order from the "C Difficile Testing" order set.**)  Once,   STAT        10/25/20 2307          Medications reviewed:  Scheduled Meds: . apixaban  5 mg Oral BID  . aspirin  81 mg Oral Daily  . atorvastatin  40 mg Oral Daily  . metoprolol tartrate  50 mg Oral BID  . potassium chloride  40 mEq Oral BID  . predniSONE  10 mg Oral Q breakfast   Continuous Infusions: . cefTRIAXone (ROCEPHIN)  IV 2 g (10/27/20 0513)  . metronidazole 500 mg (10/27/20 1248)    Consultants:see note  Procedures:see note  Antimicrobials: Anti-infectives (From admission, onward)   Start     Dose/Rate Route Frequency Ordered Stop   10/27/20 0600  cefTRIAXone (ROCEPHIN) 2 g in sodium chloride 0.9 % 100 mL IVPB        2 g 200 mL/hr over 30 Minutes Intravenous Daily 10/26/20 0443     10/26/20 1200  metroNIDAZOLE (FLAGYL) IVPB 500 mg        500 mg 100 mL/hr over 60 Minutes Intravenous Every 8 hours 10/26/20 0443     10/26/20 0200  cefTRIAXone (ROCEPHIN) 2 g in sodium chloride 0.9 % 100 mL IVPB        2 g 200 mL/hr over 30 Minutes Intravenous  Once 10/26/20 0146 10/26/20 0409   10/26/20 0200  metroNIDAZOLE (FLAGYL) IVPB 500 mg        500 mg 100 mL/hr over 60 Minutes Intravenous  Once 10/26/20 0146 10/26/20 0631     Culture/Microbiology    Component Value Date/Time   SDES BLOOD RIGHT ANTECUBITAL 10/26/2020 0600   SPECREQUEST  10/26/2020 0600    BOTTLES DRAWN AEROBIC ONLY Blood Culture results may not be optimal due to an excessive volume of blood received in culture bottles   CULT  10/26/2020 0600    NO GROWTH 1 DAY Performed at [facility], [place] [address] [place], [place] [address]    REPTSTATUS PENDING 10/26/2020 0600    Other culture-see note  Objective: Vitals: Today's Vitals   10/26/20 2230 10/27/20  0517 10/27/20 0848 10/27/20 0857  BP:  (!) 156/60 (!) 119/47 (!) 119/47  Pulse:  67 68 74  Resp:  15 16   Temp:  98.1 F (36.7 C) 98.9 F (37.2 C)   TempSrc:  Oral Oral   SpO2:  97% 97%   Weight:  102.5 kg    Height:      PainSc: 0-No pain  0-No pain     Intake/Output Summary (Last 24 hours) at 10/27/2020 1505 Last data filed at 10/27/2020 0831 Gross per 24 hour  Intake 3636.06 ml  Output 500 ml  Net 3136.06 ml   Filed Weights   10/25/20 2059 10/27/20 0517  Weight: 98.4 kg 102.5 kg   Weight change: 4.082 kg  Intake/Output from previous day: 04/17 0701 - 04/18 0700 In: 3876.1 [P.O.:1440; I.V.:1836.1; IV Piggyback:600] Out: -  Intake/Output this shift: Total I/O In: -  Out: 500 [Urine:500] Filed Weights   10/25/20 2059 10/27/20 0517  Weight: 98.4 kg 102.5 kg    Examination: General exam: AAO ,NAD, weak appearing. HEENT:Oral mucosa moist, Ear/Nose WNL grossly,dentition normal. Respiratory system: bilaterally diminished,no use of accessory muscle, non tender. Cardiovascular system: did not appreciate murmur, HR 60 beats/min with irregular rhythm, No JVD. Gastrointestinal system: mild tenderness in LLQ, no rebound tenderness, nondistended, BS+. Nervous System: No focal deficits. Extremities: No edema, distal peripheral pulses palpable.  Skin: No rashes or bruises. MSK: Mild weakness and deconditioning.  Data Reviewed: I have personally reviewed following labs and imaging studies CBC: Recent Labs  Lab 10/25/20 2053 10/26/20 0546 10/27/20 0604  WBC 15.8* 11.8* 9.0  NEUTROABS 12.6* 9.4* 6.5  HGB 14.0 11.7* 10.8*  HCT 42.4 35.4* 32.5*  MCV 90.6 90.5 89.0  PLT 485* 381 371   Basic Metabolic Panel: Recent Labs  Lab 10/25/20 2220 10/26/20 0744 10/27/20 0604 10/27/20 0932  NA 130* 134* 134*  --   K 5.6* 2.5* 2.5*  --   CL 105 101 106  --   CO2 17* 19* 21*  --   GLUCOSE 120* 141* 102*  --   BUN 8 10 <5*  --   CREATININE 0.75 0.67 0.60  --   CALCIUM 8.0*  8.7* 8.5*  --   MG  --   --   --  1.5*   GFR: Estimated Creatinine Clearance: 75.9 mL/min (by C-G formula based on SCr of 0.6 mg/dL). Liver Function Tests: Recent Labs  Lab 10/25/20 2220 10/26/20 0744  AST 63* 32  ALT 54* 59*  ALKPHOS 70 74  BILITOT 2.4* 0.6  PROT 5.9* 5.7*  ALBUMIN 2.8* 2.8*   Recent Labs  Lab 10/25/20 2220  LIPASE 34   CBG: Recent Labs  Lab 10/26/20 0811 10/27/20 0813 10/27/20 1113  GLUCAP 143* 94 114*    Thyroid Function Tests: Recent Labs    10/26/20 0546  TSH 1.234     Recent Results (from the past 240 hour(s))  Blood culture (routine x 2)     Status: None (Preliminary result)   Collection Time: 10/26/20  1:16 AM   Specimen: BLOOD  Result Value Ref Range Status   Specimen Description BLOOD SITE NOT SPECIFIED  Final   Special Requests   Final    BOTTLES DRAWN  AEROBIC AND ANAEROBIC Blood Culture results may not be optimal due to an inadequate volume of blood received in culture bottles   Culture   Final    NO GROWTH 1 DAY Performed at [facility] [address] [place], [place] [address]    Report Status PENDING  Incomplete  Resp Panel by RT-PCR (Flu A&B, Covid) Nasopharyngeal Swab     Status: None   Collection Time: 10/26/20  1:16 AM   Specimen: Nasopharyngeal Swab; Nasopharyngeal(NP) swabs in vial transport medium  Result Value Ref Range Status   SARS Coronavirus 2 by RT PCR NEGATIVE NEGATIVE Final    Comment: (NOTE) SARS-CoV-2 target nucleic acids are NOT DETECTED.  The SARS-CoV-2 RNA is generally detectable in upper respiratory specimens during the acute phase of infection. The lowest concentration of SARS-CoV-2 viral copies this assay can detect is 138 copies/mL. A negative result does not preclude SARS-Cov-2 infection and should not be used as the sole basis for treatment or other patient management decisions. A negative result may occur with  improper specimen collection/handling, submission of specimen other than  nasopharyngeal swab, presence of viral mutation(s) within the areas targeted by this assay, and inadequate number of viral copies(<138 copies/mL). A negative result must be combined with clinical observations, patient history, and epidemiological information. The expected result is Negative.  Fact Sheet for Patients:  EntrepreneurPulse.com.au  Fact Sheet for Healthcare Providers:  IncredibleEmployment.be  This test is no t yet approved or cleared by the [place] FDA and  has been authorized for detection and/or diagnosis of SARS-CoV-2 by FDA under an Emergency Use Authorization (EUA). This EUA will remain  in effect (meaning this test can be used) for the duration of the COVID-19 declaration under Section 564(b)(1) of the Act, 21 U.S.C.section 360bbb-3(b)(1), unless the authorization is terminated  or revoked sooner.       Influenza A by PCR NEGATIVE NEGATIVE Final   Influenza B by PCR NEGATIVE NEGATIVE Final    Comment: (NOTE) The Xpert Xpress SARS-CoV-2/FLU/RSV plus assay is intended as an aid in the diagnosis of influenza from Nasopharyngeal swab specimens and should not be used as a sole basis for treatment. Nasal washings and aspirates are unacceptable for Xpert Xpress SARS-CoV-2/FLU/RSV testing.  Fact Sheet for Patients: EntrepreneurPulse.com.au  Fact Sheet for Healthcare Providers: IncredibleEmployment.be  This test is not yet approved or cleared by the [place] FDA and has been authorized for detection and/or diagnosis of SARS-CoV-2 by FDA under an Emergency Use Authorization (EUA). This EUA will remain in effect (meaning this test can be used) for the duration of the COVID-19 declaration under Section 564(b)(1) of the Act, 21 U.S.C. section 360bbb-3(b)(1), unless the authorization is terminated or revoked.  Performed at [facility], [place] [address], [place], [place] [address]    Blood culture (routine x 2)     Status: None (Preliminary result)   Collection Time: 10/26/20  6:00 AM   Specimen: BLOOD  Result Value Ref Range Status   Specimen Description BLOOD RIGHT ANTECUBITAL  Final   Special Requests   Final    BOTTLES DRAWN AEROBIC ONLY Blood Culture results may not be optimal due to an excessive volume of blood received in culture bottles   Culture   Final    NO GROWTH 1 DAY Performed at [facility], [place] [address], [place], [place] [address]    Report Status PENDING  Incomplete     Radiology Studies: CT ABDOMEN PELVIS W CONTRAST  Result Date: 10/26/2020 CLINICAL DATA:  Abdominal pain EXAM: CT ABDOMEN AND PELVIS WITH CONTRAST TECHNIQUE: Multidetector CT imaging of the abdomen and pelvis was performed using the standard protocol following bolus administration of intravenous contrast. CONTRAST:  163mL OMNIPAQUE IOHEXOL 300 MG/ML  SOLN COMPARISON:  05/14/2018 FINDINGS: Lower chest: Coronary artery calcifications.  No acute abnormality. Hepatobiliary: No focal liver abnormality is seen. Status post cholecystectomy. No biliary dilatation. Pancreas: No focal abnormality or ductal dilatation. Spleen: No focal abnormality.  Normal size. Adrenals/Urinary Tract: No adrenal abnormality. No focal renal abnormality. No stones or hydronephrosis. Urinary bladder is unremarkable. Stomach/Bowel: Sigmoid diverticulosis. Inflammatory stranding around the sigmoid colon compatible with active diverticulitis. Gastric lap band noted in place. No complicating feature. Stomach and small bowel decompressed. Vascular/Lymphatic: Aortoiliac atherosclerosis. No evidence of aneurysm or adenopathy. Reproductive: Uterus and adnexa unremarkable.  No mass. Other: No free fluid or free air. Musculoskeletal: No acute bony abnormality. IMPRESSION: Sigmoid diverticulosis. Slight inflammatory stranding around the mid sigmoid colon compatible with active diverticulitis. Ancillary findings as above.  Electronically Signed   By: Rolm Baptise M.D.   On: 10/26/2020 00:56   DG Chest Port 1 View  Result Date: 10/26/2020 CLINICAL DATA:  Fever EXAM: PORTABLE CHEST 1 VIEW COMPARISON:  04/25/2020 FINDINGS: Cardiomegaly, vascular congestion. No confluent opacities, effusions or overt edema. IMPRESSION: Cardiomegaly, vascular congestion. Electronically Signed   By: Rolm Baptise M.D.   On: 10/26/2020 00:10   ECHOCARDIOGRAM COMPLETE  Result Date: 10/27/2020    ECHOCARDIOGRAM REPORT   Patient Name:   MICHAELEEN DOWN Date of Exam: 10/27/2020 Medical Rec #:  [id number]       Height:       63.0 in Accession #:    [id number]      Weight:       226.0 lb Date of Birth:  [birth year]      BSA:          2.037 m Patient Age:    68 years        BP:           156/60 mmHg Patient Gender: F               HR:           65 bpm. Exam Location:  Inpatient Procedure: 2D Echo, Cardiac Doppler and Color Doppler Indications:    I48.91* Unspeicified atrial fibrillation  History:        Patient has prior history of Echocardiogram examinations, most                 recent 12/03/2016. CHF, CAD, Stroke; Risk Factors:Dyslipidemia,                 Hypertension and Diabetes.  Sonographer:    Bernadene Person RDCS Referring Phys: [place]  1. Left ventricular ejection fraction, by estimation, is 60 to 65%. The left ventricle has normal function. The left ventricle has no regional wall motion abnormalities. There is mild concentric left ventricular hypertrophy. Left ventricular diastolic parameters are consistent with Grade II diastolic dysfunction (pseudonormalization).  2. Right ventricular systolic function is normal. The right ventricular size is normal. There is normal pulmonary artery systolic pressure. The estimated right ventricular systolic pressure is 35.3 mmHg.  3. Left atrial size was mildly dilated.  4. The mitral valve is grossly normal. Mild mitral valve regurgitation. No evidence of mitral stenosis.  5. The  aortic valve is tricuspid. Aortic valve regurgitation is trivial. No aortic stenosis  is present.  6. The inferior vena cava is dilated in size with <50% respiratory variability, suggesting right atrial pressure of 15 mmHg. Comparison(s): No significant change from prior study. FINDINGS  Left Ventricle: Left ventricular ejection fraction, by estimation, is 60 to 65%. The left ventricle has normal function. The left ventricle has no regional wall motion abnormalities. The left ventricular internal cavity size was normal in size. There is  mild concentric left ventricular hypertrophy. Left ventricular diastolic parameters are consistent with Grade II diastolic dysfunction (pseudonormalization). Right Ventricle: The right ventricular size is normal. No increase in right ventricular wall thickness. Right ventricular systolic function is normal. There is normal pulmonary artery systolic pressure. The tricuspid regurgitant velocity is 2.65 m/s, and  with an assumed right atrial pressure of 3 mmHg, the estimated right ventricular systolic pressure is 75.9 mmHg. Left Atrium: Left atrial size was mildly dilated. Right Atrium: Right atrial size was normal in size. Pericardium: There is no evidence of pericardial effusion. Mitral Valve: The mitral valve is grossly normal. There is mild thickening of the mitral valve leaflet(s). There is mild calcification of the mitral valve leaflet(s). Mild mitral annular calcification. Mild mitral valve regurgitation. No evidence of mitral valve stenosis. Tricuspid Valve: The tricuspid valve is normal in structure. Tricuspid valve regurgitation is mild. Aortic Valve: The aortic valve is tricuspid. Aortic valve regurgitation is trivial. No aortic stenosis is present. Pulmonic Valve: The pulmonic valve was normal in structure. Pulmonic valve regurgitation is trivial. Aorta: The aortic root and ascending aorta are structurally normal, with no evidence of dilitation. Venous: The inferior vena  cava is dilated in size with less than 50% respiratory variability, suggesting right atrial pressure of 15 mmHg. IAS/Shunts: No atrial level shunt detected by color flow Doppler.  LEFT VENTRICLE PLAX 2D LVIDd:         3.55 cm  Diastology LVIDs:         2.90 cm  LV e' medial:    8.33 cm/s LV PW:         1.00 cm  LV E/e' medial:  14.3 LV IVS:        1.10 cm  LV e' lateral:   9.98 cm/s LVOT diam:     1.90 cm  LV E/e' lateral: 11.9 LV SV:         75 LV SV Index:   37 LVOT Area:     2.84 cm  RIGHT VENTRICLE RV S prime:     11.30 cm/s TAPSE (M-mode): 1.8 cm LEFT ATRIUM             Index       RIGHT ATRIUM           Index LA diam:        4.20 cm 2.06 cm/m  RA Area:     14.40 cm LA Vol (A2C):   54.3 ml 26.66 ml/m RA Volume:   34.10 ml  16.74 ml/m LA Vol (A4C):   55.3 ml 27.15 ml/m LA Biplane Vol: 55.6 ml 27.30 ml/m  AORTIC VALVE LVOT Vmax:   110.00 cm/s LVOT Vmean:  78.900 cm/s LVOT VTI:    0.265 m  AORTA Ao Root diam: 3.10 cm MITRAL VALVE                TRICUSPID VALVE MV Area (PHT): 3.12 cm     TR Peak grad:   28.1 mmHg MV Decel Time: 243 msec     TR Vmax:  265.00 cm/s MV E velocity: 119.00 cm/s MV A velocity: 58.00 cm/s   SHUNTS MV E/A ratio:  2.05         Systemic VTI:  0.26 m                             Systemic Diam: 1.90 cm Gwyndolyn Kaufman MD Electronically signed by Gwyndolyn Kaufman MD Signature Date/Time: 10/27/2020/1:40:24 PM    Final      LOS: 1 day   George Hugh, MD [facility]  10/27/2020, 3:05 PM

## 2020-10-27 NOTE — Progress Notes (Signed)
  Echocardiogram 2D Echocardiogram has been performed.  Cindy Holmes 10/27/2020, 12:01 PM

## 2020-10-27 NOTE — Progress Notes (Signed)
No worrisome finding on echo. Patient in [place] will sign off.   Medication Recommendations:  Continue BB Other recommendations (labs, testing, etc):  none Follow up as an outpatient:  Will arrange CV follow up in my clinic - prior patient of Dr. Meda Coffee.

## 2020-10-27 NOTE — Progress Notes (Signed)
Critical value- Potassium 2.5- paged [facility] with this information

## 2020-10-28 ENCOUNTER — Other Ambulatory Visit (HOSPITAL_COMMUNITY): Payer: Self-pay

## 2020-10-28 DIAGNOSIS — K5792 Diverticulitis of intestine, part unspecified, without perforation or abscess without bleeding: Secondary | ICD-10-CM | POA: Diagnosis not present

## 2020-10-28 LAB — CBC WITH DIFFERENTIAL/PLATELET
Abs Immature Granulocytes: 0 10*3/uL (ref 0.00–0.07)
Basophils Absolute: 0.1 10*3/uL (ref 0.0–0.1)
Basophils Relative: 1 %
Eosinophils Absolute: 0 10*3/uL (ref 0.0–0.5)
Eosinophils Relative: 0 %
HCT: 31.8 % — ABNORMAL LOW (ref 36.0–46.0)
Hemoglobin: 10.1 g/dL — ABNORMAL LOW (ref 12.0–15.0)
Lymphocytes Relative: 19 %
Lymphs Abs: 2 10*3/uL (ref 0.7–4.0)
MCH: 29.1 pg (ref 26.0–34.0)
MCHC: 31.8 g/dL (ref 30.0–36.0)
MCV: 91.6 fL (ref 80.0–100.0)
Monocytes Absolute: 0.4 10*3/uL (ref 0.1–1.0)
Monocytes Relative: 4 %
Neutro Abs: 8 10*3/uL — ABNORMAL HIGH (ref 1.7–7.7)
Neutrophils Relative %: 76 %
Platelets: 304 10*3/uL (ref 150–400)
RBC: 3.47 MIL/uL — ABNORMAL LOW (ref 3.87–5.11)
RDW: 15.8 % — ABNORMAL HIGH (ref 11.5–15.5)
WBC: 10.5 10*3/uL (ref 4.0–10.5)
nRBC: 0 % (ref 0.0–0.2)
nRBC: 0 /100 WBC

## 2020-10-28 LAB — BASIC METABOLIC PANEL
Anion gap: 6 (ref 5–15)
BUN: 7 mg/dL — ABNORMAL LOW (ref 8–23)
CO2: 21 mmol/L — ABNORMAL LOW (ref 22–32)
Calcium: 8.5 mg/dL — ABNORMAL LOW (ref 8.9–10.3)
Chloride: 108 mmol/L (ref 98–111)
Creatinine, Ser: 0.6 mg/dL (ref 0.44–1.00)
GFR, Estimated: >60 mL/min (ref >60)
Glucose, Bld: 94 mg/dL (ref 70–99)
Potassium: 3.8 mmol/L (ref 3.5–5.1)
Sodium: 135 mmol/L (ref 135–145)

## 2020-10-28 LAB — GLUCOSE, CAPILLARY
Glucose-Capillary: 94 mg/dL (ref 70–99)
Glucose-Capillary: 87 mg/dL (ref 70–99)

## 2020-10-28 MED ORDER — FUROSEMIDE 40 MG PO TABS: ORAL_TABLET | ORAL | 3 refills | Status: DC

## 2020-10-28 MED ORDER — METRONIDAZOLE 500 MG PO TABS: 500.0000 mg | ORAL_TABLET | Freq: Three times a day (TID) | ORAL | 0 refills | Status: AC

## 2020-10-28 MED ORDER — CIPROFLOXACIN HCL 500 MG PO TABS: 500.0000 mg | ORAL_TABLET | Freq: Two times a day (BID) | ORAL | 0 refills | Status: AC

## 2020-10-28 MED ORDER — METOPROLOL TARTRATE 50 MG PO TABS: 50.0000 mg | ORAL_TABLET | Freq: Two times a day (BID) | ORAL | 0 refills | Status: DC

## 2020-10-28 MED ORDER — APIXABAN 5 MG PO TABS: 5.0000 mg | ORAL_TABLET | Freq: Two times a day (BID) | ORAL | 3 refills | Status: DC

## 2020-10-28 MED ORDER — NITROGLYCERIN 0.4 MG SL SUBL
SUBLINGUAL_TABLET | SUBLINGUAL | 1 refills | Status: DC
Start: 2020-10-28 — End: 2020-10-29

## 2020-10-28 MED ORDER — DIPHENHYDRAMINE-APAP (SLEEP) 25-500 MG PO TABS: 1.0000 | ORAL_TABLET | Freq: Every day | ORAL | 0 refills | Status: DC

## 2020-10-28 NOTE — Discharge Summary (Signed)
Physician Discharge Summary  Cindy Holmes WGN:[id number] DOB: 07-Mar-1951 DOA: 10/25/2020  PCP: Redmond School, MD  Admit date: 10/25/2020 Discharge date: 10/28/2020  Admitted From: Home  Disposition:  Home  Recommendations for Outpatient Follow-up:  1. Follow up with PCP in 1 week 2. Follow up with Cardiologist. 3. Follow up with Rheumatologist. 4.  Home Health: None Equipment/Devices: None  Discharge Condition: Stable Code Status:   Code Status: Prior Diet recommendation:  Diet Order            Diet - low sodium heart healthy                  Brief/Interim Summary: 70 year old female with a PMH of Hypertension, CAD s/p DES, Chronic Diastolic Heart Failure, Paroxysmal Atrial Fibrillation, Rheumatoid Arthritis on chronic steroids, and 2006 episode of diverticulitis who presents to the ED on 4/16 with LLQ  Pain and recurrent nausea, vomiting, and episodes of diarrhea who was found to be in Afib with RVR and HR ~ 160 beats/min.  Abdominal CT showed moderate sigmoid diverticulitis.  There was no perforation, abscess, obstruction, fistula, or other abnormality.  Abdominal exam was benign.  CRP was normal.    Acute Sigmoid Diverticulitis, uncomplicated, second episode:  - Given that stranding was moderate in severity and patient iwas chronically immunosuppressed, we treated with antibiotics, fluids, anti-emetics, and bowel rest. - Patient received 3 days of IV Ceftriaxone and Flagyl and was discharged on 4 days of Ciprofloxacin and Flagyl to complete 7 day course.  Atrial Fibrillation: - Echo showed grade 1 diastolic heart failure, normal EF and no wall motion abnormalities. - Patient's Lopressor was increased from 25 to 50 mg BID for rate control. - She was started on Eliquis 5 mg BID for anticoagulation. - She will follow up with Cardiology outpatient.  Coronary Artery Disease: - Patient was discharged on Aspirin 81 mg daily and Atorvastatin 40 mg daily. - Plavix was  discontinued.  Rheumatoid Arthritis / Chronic Steroid Use: - Cortisol level is 65.  Prednisone was increased from 5 to 10 mg daily for two days given acute infection.  She was discharged on regular home dose 5 mg daily. - She will also continue her other medications for autoimmune condition and follow up with her Rheumatologist.   Discharge Diagnoses:  Principal Problem:   Diverticulitis Active Problems:   Essential hypertension   Rheumatoid arthritis (Cindy Holmes)   Coronary artery disease involving native coronary artery of native heart without angina pectoris   Chronic diastolic CHF (congestive heart failure) (HCC)   Atrial fibrillation with RVR (Cindy Holmes)   Acute diverticulitis   Consults:  Cardiology  Subjective: Patient is stable on day of discharge. Discharge Exam: Vitals:   10/28/20 0821 10/28/20 0831  BP: (!) 115/48 (!) 115/48  Pulse: 72 94  Resp: 16   Temp: 98.9 F (37.2 C)   SpO2: 99%    General: Pt is alert, awake, not in acute distress Cardiovascular: RRR, S1/S2 +, no rubs, no gallops Respiratory: CTA bilaterally, no wheezing, no rhonchi Abdominal: Soft, NT, ND, bowel sounds + Extremities: no edema, no cyanosis  Discharge Instructions  Discharge Instructions    Diet - low sodium heart healthy   Complete by: As directed    Increase activity slowly   Complete by: As directed    Increase activity slowly   Complete by: As directed      Allergies as of 10/28/2020      Reactions   Codeine Phosphate Shortness Of Breath  Morphine And Related Nausea And Vomiting   Amoxicillin Nausea And Vomiting   Penicillins Nausea And Vomiting   Has patient had a PCN reaction causing immediate rash, facial/tongue/throat swelling, SOB or lightheadedness with hypotension:YES Has patient had a PCN reaction causing severe rash involving mucus membranes or skin necrosis: NO Has patient had a PCN reaction that required hospitalization NO Has patient had a PCN reaction occurring within  the last 10 years: NO If all of the above answers are "NO", then may proceed with Cephalosporin use.   Lidocaine Rash   Reaction to lidocaine patch      Medication List    STOP taking these medications   clopidogrel 75 MG tablet Commonly known as: PLAVIX   doxycycline 100 MG capsule Commonly known as: VIBRAMYCIN     TAKE these medications   acetaminophen 500 MG tablet Commonly known as: TYLENOL Take 1,000 mg by mouth every 6 (six) hours as needed for headache (pain).   allopurinol 100 MG tablet Commonly known as: ZYLOPRIM Take 400 mg by mouth at bedtime.   apixaban 5 MG Tabs tablet Commonly known as: ELIQUIS Take 1 tablet (5 mg total) by mouth 2 (two) times daily.   aspirin EC 81 MG tablet Take 81 mg by mouth at bedtime. Swallow whole.   atorvastatin 40 MG tablet Commonly known as: LIPITOR Take 2 tablets (80 mg total) by mouth daily What changed:   how much to take  how to take this  when to take this  additional instructions   bisacodyl 5 MG EC tablet Commonly known as: DULCOLAX Take 5 mg by mouth at bedtime as needed for moderate constipation.   ciprofloxacin 500 MG tablet Commonly known as: Cipro Take 1 tablet (500 mg total) by mouth 2 (two) times daily for 5 days.   diphenhydramine-acetaminophen 25-500 MG Tabs tablet Commonly known as: TYLENOL PM Take 1 tablet by mouth at bedtime. What changed: how much to take   ezetimibe 10 MG tablet Commonly known as: ZETIA Take 1 tablet (10 mg total) by mouth daily. Please make yearly appt with Dr. Meda Coffee for May 2022 for future refills. Thank you 1st attempt What changed: when to take this   Fish Oil 1000 MG Caps Take 1,000 mg by mouth at bedtime.   folic acid 1 MG tablet Commonly known as: FOLVITE Take 1 mg by mouth at bedtime.   furosemide 40 MG tablet Commonly known as: LASIX TAKE 1 TABLET BY MOUTH  DAILY AND MAY TAKE 1  ADDITIONAL TABLET DAILY AS  NEEDED FOR SWELLING What changed:   how much to  take  how to take this  when to take this  additional instructions   hydroxychloroquine 200 MG tablet Commonly known as: PLAQUENIL Take 200 mg by mouth at bedtime.   levocetirizine 5 MG tablet Commonly known as: XYZAL Take 5 mg by mouth at bedtime.   melatonin 3 MG Tabs tablet Take 3 mg by mouth at bedtime as needed (sleep).   methotrexate 2.5 MG tablet Commonly known as: RHEUMATREX Take 10 mg by mouth every Sunday.   metoprolol tartrate 50 MG tablet Commonly known as: LOPRESSOR Take 1 tablet (50 mg total) by mouth 2 (two) times daily. Please make yearly appt with Dr. Meda Coffee for May 2022 before anymore refills. Thank you 1st attempt What changed: how much to take   metroNIDAZOLE 500 MG tablet Commonly known as: Flagyl Take 1 tablet (500 mg total) by mouth 3 (three) times daily for 5 days.  nitroGLYCERIN 0.4 MG SL tablet Commonly known as: NITROSTAT DISSOLVE 1 TABLET UNDER THE TONGUE EVERY 5 MINUTES AS  NEEDED (NOT TO EXCEED 3  TABLETS IN 15 MINS, CALL  911 IF CHEST PAIN PERSISTS What changed:   how much to take  how to take this  when to take this  reasons to take this  additional instructions   predniSONE 5 MG tablet Commonly known as: DELTASONE Take 5 mg by mouth every morning.   ramipril 2.5 MG capsule Commonly known as: ALTACE TAKE 1 CAPSULE BY MOUTH  DAILY What changed: when to take this   valACYclovir 1000 MG tablet Commonly known as: VALTREX Take 4,000 mg by mouth once as needed (fever blisters).   zolpidem 5 MG tablet Commonly known as: AMBIEN Take 5 mg by mouth at bedtime.       Allergies  Allergen Reactions  . Codeine Phosphate Shortness Of Breath  . Morphine And Related Nausea And Vomiting  . Amoxicillin Nausea And Vomiting  . Penicillins Nausea And Vomiting    Has patient had a PCN reaction causing immediate rash, facial/tongue/throat swelling, SOB or lightheadedness with hypotension:YES Has patient had a PCN reaction causing  severe rash involving mucus membranes or skin necrosis: NO Has patient had a PCN reaction that required hospitalization NO Has patient had a PCN reaction occurring within the last 10 years: NO If all of the above answers are "NO", then may proceed with Cephalosporin use.  . Lidocaine Rash    Reaction to lidocaine patch    The results of significant diagnostics from this hospitalization (including imaging, microbiology, ancillary and laboratory) are listed below for reference.    Microbiology: Recent Results (from the past 240 hour(s))  Blood culture (routine x 2)     Status: None (Preliminary result)   Collection Time: 10/26/20  1:16 AM   Specimen: BLOOD  Result Value Ref Range Status   Specimen Description BLOOD SITE NOT SPECIFIED  Final   Special Requests   Final    BOTTLES DRAWN AEROBIC AND ANAEROBIC Blood Culture results may not be optimal due to an inadequate volume of blood received in culture bottles   Culture   Final    NO GROWTH 2 DAYS Performed at [facility], [place] [address] [place], [place] [address]    Report Status PENDING  Incomplete  Resp Panel by RT-PCR (Flu A&B, Covid) Nasopharyngeal Swab     Status: None   Collection Time: 10/26/20  1:16 AM   Specimen: Nasopharyngeal Swab; Nasopharyngeal(NP) swabs in vial transport medium  Result Value Ref Range Status   SARS Coronavirus 2 by RT PCR NEGATIVE NEGATIVE Final    Comment: (NOTE) SARS-CoV-2 target nucleic acids are NOT DETECTED.  The SARS-CoV-2 RNA is generally detectable in upper respiratory specimens during the acute phase of infection. The lowest concentration of SARS-CoV-2 viral copies this assay can detect is 138 copies/mL. A negative result does not preclude SARS-Cov-2 infection and should not be used as the sole basis for treatment or other patient management decisions. A negative result may occur with  improper specimen collection/handling, submission of specimen other than nasopharyngeal swab,  presence of viral mutation(s) within the areas targeted by this assay, and inadequate number of viral copies(<138 copies/mL). A negative result must be combined with clinical observations, patient history, and epidemiological information. The expected result is Negative.  Fact Sheet for Patients:  EntrepreneurPulse.com.au  Fact Sheet for Healthcare Providers:  IncredibleEmployment.be  This test is no t yet approved  or cleared by the [place] and  has been authorized for detection and/or diagnosis of SARS-CoV-2 by FDA under an Emergency Use Authorization (EUA). This EUA will remain  in effect (meaning this test can be used) for the duration of the COVID-19 declaration under Section 564(b)(1) of the Act, 21 U.S.C.section 360bbb-3(b)(1), unless the authorization is terminated  or revoked sooner.       Influenza A by PCR NEGATIVE NEGATIVE Final   Influenza B by PCR NEGATIVE NEGATIVE Final    Comment: (NOTE) The Xpert Xpress SARS-CoV-2/FLU/RSV plus assay is intended as an aid in the diagnosis of influenza from Nasopharyngeal swab specimens and should not be used as a sole basis for treatment. Nasal washings and aspirates are unacceptable for Xpert Xpress SARS-CoV-2/FLU/RSV testing.  Fact Sheet for Patients: EntrepreneurPulse.com.au  Fact Sheet for Healthcare Providers: IncredibleEmployment.be  This test is not yet approved or cleared by the [place] FDA and has been authorized for detection and/or diagnosis of SARS-CoV-2 by FDA under an Emergency Use Authorization (EUA). This EUA will remain in effect (meaning this test can be used) for the duration of the COVID-19 declaration under Section 564(b)(1) of the Act, 21 U.S.C. section 360bbb-3(b)(1), unless the authorization is terminated or revoked.  Performed at [facility], [place] [address] [place], [place] [address]   Blood culture  (routine x 2)     Status: None (Preliminary result)   Collection Time: 10/26/20  6:00 AM   Specimen: BLOOD  Result Value Ref Range Status   Specimen Description BLOOD RIGHT ANTECUBITAL  Final   Special Requests   Final    BOTTLES DRAWN AEROBIC ONLY Blood Culture results may not be optimal due to an excessive volume of blood received in culture bottles   Culture   Final    NO GROWTH 2 DAYS Performed at [facility], [place] [address] [place], [place] [address]    Report Status PENDING  Incomplete    Procedures/Studies: CT ABDOMEN PELVIS W CONTRAST  Result Date: 10/26/2020 CLINICAL DATA:  Abdominal pain EXAM: CT ABDOMEN AND PELVIS WITH CONTRAST TECHNIQUE: Multidetector CT imaging of the abdomen and pelvis was performed using the standard protocol following bolus administration of intravenous contrast. CONTRAST:  125mL OMNIPAQUE IOHEXOL 300 MG/ML  SOLN COMPARISON:  05/14/2018 FINDINGS: Lower chest: Coronary artery calcifications.  No acute abnormality. Hepatobiliary: No focal liver abnormality is seen. Status post cholecystectomy. No biliary dilatation. Pancreas: No focal abnormality or ductal dilatation. Spleen: No focal abnormality.  Normal size. Adrenals/Urinary Tract: No adrenal abnormality. No focal renal abnormality. No stones or hydronephrosis. Urinary bladder is unremarkable. Stomach/Bowel: Sigmoid diverticulosis. Inflammatory stranding around the sigmoid colon compatible with active diverticulitis. Gastric lap band noted in place. No complicating feature. Stomach and small bowel decompressed. Vascular/Lymphatic: Aortoiliac atherosclerosis. No evidence of aneurysm or adenopathy. Reproductive: Uterus and adnexa unremarkable.  No mass. Other: No free fluid or free air. Musculoskeletal: No acute bony abnormality. IMPRESSION: Sigmoid diverticulosis. Slight inflammatory stranding around the mid sigmoid colon compatible with active diverticulitis. Ancillary findings as above. Electronically Signed    By: Rolm Baptise M.D.   On: 10/26/2020 00:56   DG Chest Port 1 View  Result Date: 10/26/2020 CLINICAL DATA:  Fever EXAM: PORTABLE CHEST 1 VIEW COMPARISON:  04/25/2020 FINDINGS: Cardiomegaly, vascular congestion. No confluent opacities, effusions or overt edema. IMPRESSION: Cardiomegaly, vascular congestion. Electronically Signed   By: Rolm Baptise M.D.   On: 10/26/2020 00:10   ECHOCARDIOGRAM COMPLETE  Result Date: 10/27/2020  ECHOCARDIOGRAM REPORT   Patient Name:   Cindy Holmes Date of Exam: 10/27/2020 Medical Rec #:  [id number]       Height:       63.0 in Accession #:    [id number]      Weight:       226.0 lb Date of Birth:  1950/07/17      BSA:          2.037 m Patient Age:    70 years        BP:           156/60 mmHg Patient Gender: F               HR:           65 bpm. Exam Location:  Inpatient Procedure: 2D Echo, Cardiac Doppler and Color Doppler Indications:    I48.91* Unspeicified atrial fibrillation  History:        Patient has prior history of Echocardiogram examinations, most                 recent 12/03/2016. CHF, CAD, Stroke; Risk Factors:Dyslipidemia,                 Hypertension and Diabetes.  Sonographer:    Bernadene Person RDCS Referring Phys: [place]  1. Left ventricular ejection fraction, by estimation, is 60 to 65%. The left ventricle has normal function. The left ventricle has no regional wall motion abnormalities. There is mild concentric left ventricular hypertrophy. Left ventricular diastolic parameters are consistent with Grade II diastolic dysfunction (pseudonormalization).  2. Right ventricular systolic function is normal. The right ventricular size is normal. There is normal pulmonary artery systolic pressure. The estimated right ventricular systolic pressure is 60.6 mmHg.  3. Left atrial size was mildly dilated.  4. The mitral valve is grossly normal. Mild mitral valve regurgitation. No evidence of mitral stenosis.  5. The aortic valve is  tricuspid. Aortic valve regurgitation is trivial. No aortic stenosis is present.  6. The inferior vena cava is dilated in size with <50% respiratory variability, suggesting right atrial pressure of 15 mmHg. Comparison(s): No significant change from prior study. FINDINGS  Left Ventricle: Left ventricular ejection fraction, by estimation, is 60 to 65%. The left ventricle has normal function. The left ventricle has no regional wall motion abnormalities. The left ventricular internal cavity size was normal in size. There is  mild concentric left ventricular hypertrophy. Left ventricular diastolic parameters are consistent with Grade II diastolic dysfunction (pseudonormalization). Right Ventricle: The right ventricular size is normal. No increase in right ventricular wall thickness. Right ventricular systolic function is normal. There is normal pulmonary artery systolic pressure. The tricuspid regurgitant velocity is 2.65 m/s, and  with an assumed right atrial pressure of 3 mmHg, the estimated right ventricular systolic pressure is 30.1 mmHg. Left Atrium: Left atrial size was mildly dilated. Right Atrium: Right atrial size was normal in size. Pericardium: There is no evidence of pericardial effusion. Mitral Valve: The mitral valve is grossly normal. There is mild thickening of the mitral valve leaflet(s). There is mild calcification of the mitral valve leaflet(s). Mild mitral annular calcification. Mild mitral valve regurgitation. No evidence of mitral valve stenosis. Tricuspid Valve: The tricuspid valve is normal in structure. Tricuspid valve regurgitation is mild. Aortic Valve: The aortic valve is tricuspid. Aortic valve regurgitation is trivial. No aortic stenosis is present. Pulmonic Valve: The pulmonic valve was normal in structure. Pulmonic valve regurgitation is trivial. Aorta:  The aortic root and ascending aorta are structurally normal, with no evidence of dilitation. Venous: The inferior vena cava is dilated in  size with less than 50% respiratory variability, suggesting right atrial pressure of 15 mmHg. IAS/Shunts: No atrial level shunt detected by color flow Doppler.  LEFT VENTRICLE PLAX 2D LVIDd:         3.55 cm  Diastology LVIDs:         2.90 cm  LV e' medial:    8.33 cm/s LV PW:         1.00 cm  LV E/e' medial:  14.3 LV IVS:        1.10 cm  LV e' lateral:   9.98 cm/s LVOT diam:     1.90 cm  LV E/e' lateral: 11.9 LV SV:         75 LV SV Index:   37 LVOT Area:     2.84 cm  RIGHT VENTRICLE RV S prime:     11.30 cm/s TAPSE (M-mode): 1.8 cm LEFT ATRIUM             Index       RIGHT ATRIUM           Index LA diam:        4.20 cm 2.06 cm/m  RA Area:     14.40 cm LA Vol (A2C):   54.3 ml 26.66 ml/m RA Volume:   34.10 ml  16.74 ml/m LA Vol (A4C):   55.3 ml 27.15 ml/m LA Biplane Vol: 55.6 ml 27.30 ml/m  AORTIC VALVE LVOT Vmax:   110.00 cm/s LVOT Vmean:  78.900 cm/s LVOT VTI:    0.265 m  AORTA Ao Root diam: 3.10 cm MITRAL VALVE                TRICUSPID VALVE MV Area (PHT): 3.12 cm     TR Peak grad:   28.1 mmHg MV Decel Time: 243 msec     TR Vmax:        265.00 cm/s MV E velocity: 119.00 cm/s MV A velocity: 58.00 cm/s   SHUNTS MV E/A ratio:  2.05         Systemic VTI:  0.26 m                             Systemic Diam: 1.90 cm Gwyndolyn Kaufman MD Electronically signed by Gwyndolyn Kaufman MD Signature Date/Time: 10/27/2020/1:40:24 PM    Final      Labs: Basic Metabolic Panel: Recent Labs  Lab 10/25/20 2220 10/26/20 0744 10/27/20 0604 10/27/20 0932 10/28/20 0335  NA 130* 134* 134*  --  135  K 5.6* 2.5* 2.5*  --  3.8  CL 105 101 106  --  108  CO2 17* 19* 21*  --  21*  GLUCOSE 120* 141* 102*  --  94  BUN 8 10 <5*  --  7*  CREATININE 0.75 0.67 0.60  --  0.60  CALCIUM 8.0* 8.7* 8.5*  --  8.5*  MG  --   --   --  1.5*  --    Liver Function Tests: Recent Labs  Lab 10/25/20 2220 10/26/20 0744  AST 63* 32  ALT 54* 59*  ALKPHOS 70 74  BILITOT 2.4* 0.6  PROT 5.9* 5.7*  ALBUMIN 2.8* 2.8*   Recent Labs   Lab 10/25/20 2220  LIPASE 34  CBC: Recent Labs  Lab 10/25/20 2053 10/26/20 0546 10/27/20 0604  10/28/20 0335  WBC 15.8* 11.8* 9.0 10.5  NEUTROABS 12.6* 9.4* 6.5 8.0*  HGB 14.0 11.7* 10.8* 10.1*  HCT 42.4 35.4* 32.5* 31.8*  MCV 90.6 90.5 89.0 91.6  PLT 485* 381 320 304   CBG: Recent Labs  Lab 10/27/20 0813 10/27/20 1113 10/27/20 1611 10/28/20 0129 10/28/20 0813  GLUCAP 94 114* 135* 94 87   Thyroid function studies Recent Labs    10/26/20 0546  TSH 1.234   Urinalysis    Component Value Date/Time   COLORURINE YELLOW 10/26/2020 2004   APPEARANCEUR HAZY (A) 10/26/2020 2004   LABSPEC 1.034 (H) 10/26/2020 2004   PHURINE 6.0 10/26/2020 2004   GLUCOSEU NEGATIVE 10/26/2020 2004   HGBUR NEGATIVE 10/26/2020 2004   [place] NEGATIVE 10/26/2020 2004   [place] NEGATIVE 10/26/2020 2004   PROTEINUR NEGATIVE 10/26/2020 2004   UROBILINOGEN 0.2 12/28/2006 0943   NITRITE NEGATIVE 10/26/2020 2004   LEUKOCYTESUR TRACE (A) 10/26/2020 2004   Sepsis Labs Invalid input(s): PROCALCITONIN,  WBC,  LACTICIDVEN Microbiology Recent Results (from the past 240 hour(s))  Blood culture (routine x 2)     Status: None (Preliminary result)   Collection Time: 10/26/20  1:16 AM   Specimen: BLOOD  Result Value Ref Range Status   Specimen Description BLOOD SITE NOT SPECIFIED  Final   Special Requests   Final    BOTTLES DRAWN AEROBIC AND ANAEROBIC Blood Culture results may not be optimal due to an inadequate volume of blood received in culture bottles   Culture   Final    NO GROWTH 2 DAYS Performed at [facility], [place] [address], [place], [place] [address]    Report Status PENDING  Incomplete  Resp Panel by RT-PCR (Flu A&B, Covid) Nasopharyngeal Swab     Status: None   Collection Time: 10/26/20  1:16 AM   Specimen: Nasopharyngeal Swab; Nasopharyngeal(NP) swabs in vial transport medium  Result Value Ref Range Status   SARS Coronavirus 2 by RT PCR NEGATIVE NEGATIVE Final     Comment: (NOTE) SARS-CoV-2 target nucleic acids are NOT DETECTED.  The SARS-CoV-2 RNA is generally detectable in upper respiratory specimens during the acute phase of infection. The lowest concentration of SARS-CoV-2 viral copies this assay can detect is 138 copies/mL. A negative result does not preclude SARS-Cov-2 infection and should not be used as the sole basis for treatment or other patient management decisions. A negative result may occur with  improper specimen collection/handling, submission of specimen other than nasopharyngeal swab, presence of viral mutation(s) within the areas targeted by this assay, and inadequate number of viral copies(<138 copies/mL). A negative result must be combined with clinical observations, patient history, and epidemiological information. The expected result is Negative.  Fact Sheet for Patients:  EntrepreneurPulse.com.au  Fact Sheet for Healthcare Providers:  IncredibleEmployment.be  This test is no t yet approved or cleared by the [place] FDA and  has been authorized for detection and/or diagnosis of SARS-CoV-2 by FDA under an Emergency Use Authorization (EUA). This EUA will remain  in effect (meaning this test can be used) for the duration of the COVID-19 declaration under Section 564(b)(1) of the Act, 21 U.S.C.section 360bbb-3(b)(1), unless the authorization is terminated  or revoked sooner.       Influenza A by PCR NEGATIVE NEGATIVE Final   Influenza B by PCR NEGATIVE NEGATIVE Final    Comment: (NOTE) The Xpert Xpress SARS-CoV-2/FLU/RSV plus assay is intended as an aid in the diagnosis of influenza from Nasopharyngeal swab specimens and should not  be used as a sole basis for treatment. Nasal washings and aspirates are unacceptable for Xpert Xpress SARS-CoV-2/FLU/RSV testing.  Fact Sheet for Patients: EntrepreneurPulse.com.au  Fact Sheet for Healthcare  Providers: IncredibleEmployment.be  This test is not yet approved or cleared by the [place] FDA and has been authorized for detection and/or diagnosis of SARS-CoV-2 by FDA under an Emergency Use Authorization (EUA). This EUA will remain in effect (meaning this test can be used) for the duration of the COVID-19 declaration under Section 564(b)(1) of the Act, 21 U.S.C. section 360bbb-3(b)(1), unless the authorization is terminated or revoked.  Performed at [facility], [place] [address] [place], [place] [address]   Blood culture (routine x 2)     Status: None (Preliminary result)   Collection Time: 10/26/20  6:00 AM   Specimen: BLOOD  Result Value Ref Range Status   Specimen Description BLOOD RIGHT ANTECUBITAL  Final   Special Requests   Final    BOTTLES DRAWN AEROBIC ONLY Blood Culture results may not be optimal due to an excessive volume of blood received in culture bottles   Culture   Final    NO GROWTH 2 DAYS Performed at [facility], [place] [address], [place], [place] [address]    Report Status PENDING  Incomplete     Time coordinating discharge: 60 minutes  SIGNED: George Hugh, MD  [facility] 10/28/2020, 5:21 PM  If 7PM-7AM, please contact night-coverage [email or web address]

## 2020-10-28 NOTE — TOC Benefit Eligibility Note (Signed)
Patient Advocate Encounter  Insurance verification completed.    The patient is currently admitted and upon discharge could be taking Eliquis 5 mg.  The current 30 day co-pay is, $47.00.   The patient is insured through [employer] Medicare Part D    Thos Matsumoto, CPhT Pharmacy Patient Advocate Specialist [place] [facility]wardship Team Direct Number: [phone]  Fax: [phone]        

## 2020-10-28 NOTE — Plan of Care (Signed)

## 2020-10-29 ENCOUNTER — Other Ambulatory Visit: Payer: Self-pay | Admitting: Internal Medicine

## 2020-10-29 DIAGNOSIS — I2510 Atherosclerotic heart disease of native coronary artery without angina pectoris: Secondary | ICD-10-CM

## 2020-10-29 MED ORDER — POTASSIUM CHLORIDE ER 20 MEQ PO TBCR: 10.0000 meq | EXTENDED_RELEASE_TABLET | Freq: Every day | ORAL | 0 refills | Status: DC

## 2020-10-29 MED ORDER — NITROGLYCERIN 0.4 MG SL SUBL: SUBLINGUAL_TABLET | SUBLINGUAL | 1 refills | Status: DC

## 2020-10-29 MED ORDER — FUROSEMIDE 40 MG PO TABS: ORAL_TABLET | ORAL | 3 refills | Status: DC

## 2020-10-29 NOTE — Progress Notes (Signed)
Ordered Lasix 40 mg daily, Potassium 20 mEQ daily, and Nitroglycerin per patient's request..  Prescriptions sent to [facility] in [place].  George Hugh, MD

## 2020-10-31 LAB — CULTURE, BLOOD (ROUTINE X 2)
Culture: NO GROWTH
Culture: NO GROWTH

## 2020-11-04 ENCOUNTER — Other Ambulatory Visit: Payer: Self-pay | Admitting: Cardiology

## 2020-11-04 DIAGNOSIS — I509 Heart failure, unspecified: Secondary | ICD-10-CM | POA: Diagnosis not present

## 2020-11-04 DIAGNOSIS — Z6841 Body Mass Index (BMI) 40.0 and over, adult: Secondary | ICD-10-CM | POA: Diagnosis not present

## 2020-11-04 DIAGNOSIS — I4891 Unspecified atrial fibrillation: Secondary | ICD-10-CM | POA: Diagnosis not present

## 2020-11-04 DIAGNOSIS — K5792 Diverticulitis of intestine, part unspecified, without perforation or abscess without bleeding: Secondary | ICD-10-CM | POA: Diagnosis not present

## 2020-11-04 DIAGNOSIS — I5032 Chronic diastolic (congestive) heart failure: Secondary | ICD-10-CM | POA: Diagnosis not present

## 2020-11-04 DIAGNOSIS — M0689 Other specified rheumatoid arthritis, multiple sites: Secondary | ICD-10-CM | POA: Diagnosis not present

## 2020-11-05 ENCOUNTER — Other Ambulatory Visit: Payer: Self-pay | Admitting: Cardiology

## 2020-11-08 DIAGNOSIS — K219 Gastro-esophageal reflux disease without esophagitis: Secondary | ICD-10-CM | POA: Diagnosis not present

## 2020-11-08 DIAGNOSIS — I4891 Unspecified atrial fibrillation: Secondary | ICD-10-CM | POA: Diagnosis not present

## 2020-11-08 DIAGNOSIS — G894 Chronic pain syndrome: Secondary | ICD-10-CM | POA: Diagnosis not present

## 2020-12-02 ENCOUNTER — Ambulatory Visit: Payer: Medicare Other | Admitting: Physician Assistant

## 2020-12-06 NOTE — Progress Notes (Signed)
Cardiology Office Note   Date:  12/09/2020   ID:  Cindy Holmes, DOB September 17, 1950, MRN [id number]  PCP:  Redmond School, MD  Cardiologist: Previously Dr. Meda Coffee, MD>>now Dr. Johney Frame, MD   Chief Complaint  Patient presents with  . Follow-up    History of Present Illness: Cindy Holmes is a 70 y.o. female who presents for hospital follow-up for new onset atrial fibrillation, seen for (previously Dr. Meda Coffee).   [name] has a history of CAD with BMS to the LAD and balloon angioplasty of D1 in 2001>>s/p balloon angioplasty of D1 in 2002>> in-stent restenosis of the proximal LAD however FFR negative in 07/6999, diastolic CHF, rheumatoid arthritis and lupus on immunosuppressants, HTN, and prior CVA who was recently seen in hospital consultation 10/26/2020 for the evaluation of atrial fibrillation felt to be in the setting of gastroenteritis.  She underwent a Myoview stress test in 2020 which was low risk with no ischemia.  Echocardiogram in 2018 with hyperdynamic LV function with grade 2 DD.  She was then seen in the outpatient setting with [name], PA-C 11/2019 at which time it was recommended she take her metoprolol twice daily instead of daily dosing. She did note a sensation of rapid palpitations therefore an event monitor was ordered however this was never completed.  She then presented to the ED with several day history of nausea, vomiting, diarrhea and poor oral intake with a 16 pound weight loss.  On ED presentation, she was noted to be in atrial fibrillation with RVR.  Abdominal CT was suspicious for diverticulitis therefore antibiotics were initiated. Given new onset AF, she was started on Eliquis 5 mg twice daily for CHA2DS2-VASc score of 6.  If bleeding risk was felt to be low, plan was to consider continuing her ASA 81 mg given her history of POBA and BMS.  She was continued on metoprolol with plans for close follow-up.  Today she states that her palpitations have  improved however will occur intermittently. She reports that she has had several episodes since hospital discharge with symptoms of palpitations and racing HR. EKG today shows NSR. She states that she was previously set up for an OP monitor however she had questions about the set up and could never get anyone to call her back from the office. Sounds like AF has likely been occurring for quite some time based on her timeline of symptoms. We discussed quantifying her AF burden with a 2 week ZIO and she aggress to this. She is also asking about repeating her carotid doppler studies and Lexiscan. I ensured her that we will get to all this but it will take some time for scheduling. She reports that she has been told that she snores and is also overweight. She has never had a sleep study which will also be beneficial. If high AF burden, may be a candidate for antiarrythmics or ablation however will need more information prior to EP referral. She denies chest pain, LE edema, SOB, PND but does have daytime fatigue.   Past Medical History:  Diagnosis Date  . CAD (coronary artery disease)    a. PTCA LAD 2001/cath 2002-prox and mid LAD stents patent, PTCA ostium Dx 1 b. cath 03/2016: patent LAD stent with less than 40% in-stent restenosis, 10-30% stenosis of D1 and distal LAD with continued medical management recommended.  . Candida esophagitis ([place])   . Carotid artery disease ([place])    Doppler September, 2011, 0-39% bilateral disease mild  .  Chronic diastolic CHF (congestive heart failure) ([place])   . Edema   . Emotional stress reaction    8/11  . HTN (hypertension)   . Hyperlipidemia   . Leg pain    July, 2012, Arterial Dopplers March 08, 2011 normal  . 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 PONV (postoperative nausea and vomiting)   . Rheumatoid arthritis ([place])   . Right rotator cuff tear  11/16/2013  . Stroke [facility])    mini stroke in past ???    Past Surgical History:  Procedure Laterality Date  . CARDIAC CATHETERIZATION     with stent placement in 2001  . CARDIAC CATHETERIZATION N/A 03/12/2016   Procedure: Left Heart Cath and Coronary Angiography;  Surgeon: Nelva Bush, MD;  Location: [facility];  Service: Cardiovascular;  Laterality: N/A;  . CARDIAC CATHETERIZATION N/A 03/12/2016   Procedure: Intravascular Pressure Wire/FFR Study;  Surgeon: Nelva Bush, MD;  Location: [facility];  Service: Cardiovascular;  Laterality: N/A;  . CHOLECYSTECTOMY    . CORONARY ANGIOPLASTY     2002  . ESOPHAGOGASTRODUODENOSCOPY N/A 12/03/2016   Procedure: ESOPHAGOGASTRODUODENOSCOPY (EGD);  Surgeon: Carol Ada, MD;  Location: [facility];  Service: Endoscopy;  Laterality: N/A;  . laproscopic adjustable gastric  banding     with APS standard system.   . OPEN REDUCTION INTERNAL FIXATION (ORIF) DISTAL RADIAL FRACTURE Right 05/15/2018   Procedure: OPEN REDUCTION INTERNAL FIXATION (ORIF) DISTAL RADIAL FRACTURE;  Surgeon: Shona Needles, MD;  Location: [facility];  Service: Orthopedics;  Laterality: Right;  . ORIF ANKLE FRACTURE Right 05/15/2018   Procedure: OPEN REDUCTION INTERNAL FIXATION (ORIF) ANKLE FRACTURE;  Surgeon: Shona Needles, MD;  Location: [facility];  Service: Orthopedics;  Laterality: Right;  . ORIF TIBIA PLATEAU Right 05/15/2018   Procedure: OPEN REDUCTION INTERNAL FIXATION (ORIF) TIBIAL PLATEAU;  Surgeon: Shona Needles, MD;  Location: [facility];  Service: Orthopedics;  Laterality: Right;  . Pt has 3 stents     sept 2001  . SHOULDER ARTHROSCOPY WITH SUBACROMIAL DECOMPRESSION, ROTATOR CUFF REPAIR AND BICEP TENDON REPAIR Right 11/16/2013   Procedure: RIGHT SHOULDER ARTHROSCOPY, EXTENSIVE DEBRIDEMENT, ROTATOR CUFF REPAIR ;  Surgeon: [name], MD;  Location: [place];  Service: Orthopedics;  Laterality: Right;  . TUBAL LIGATION       Current Outpatient  Medications  Medication Sig Dispense Refill  . acetaminophen (TYLENOL) 500 MG tablet Take 1,000 mg by mouth every 6 (six) hours as needed for headache (pain).    Marland Kitchen allopurinol (ZYLOPRIM) 100 MG tablet Take 400 mg by mouth at bedtime.    Marland Kitchen aspirin EC 81 MG tablet Take 81 mg by mouth at bedtime. Swallow whole.    . bisacodyl (DULCOLAX) 5 MG EC tablet Take 5 mg by mouth at bedtime as needed for moderate constipation.    . diphenhydramine-acetaminophen (TYLENOL PM) 25-500 MG TABS tablet Take 1 tablet by mouth at bedtime. 30 tablet 0  . folic acid (FOLVITE) 1 MG tablet Take 1 mg by mouth at bedtime.    . hydroxychloroquine (PLAQUENIL) 200 MG tablet Take 200 mg by mouth at bedtime.    Marland Kitchen levocetirizine (XYZAL) 5 MG tablet Take 5 mg by mouth at bedtime.    . Melatonin 3 MG TABS Take 3 mg by mouth at bedtime as needed (sleep).    . methotrexate (RHEUMATREX) 2.5 MG tablet  Take 10 mg by mouth every Sunday.    . nitroGLYCERIN (NITROSTAT) 0.4 MG SL tablet DISSOLVE 1 TABLET UNDER THE TONGUE EVERY 5 MINUTES AS  NEEDED (NOT TO EXCEED 3  TABLETS IN 15 MINS, CALL  911 IF CHEST PAIN PERSISTS 75 tablet 1  . Omega-3 Fatty Acids (FISH OIL) 1000 MG CAPS Take 1,000 mg by mouth at bedtime.    . predniSONE (DELTASONE) 5 MG tablet Take 5 mg by mouth every morning.    . ramipril (ALTACE) 2.5 MG capsule TAKE 1 CAPSULE BY MOUTH  DAILY (Patient taking differently: Take 2.5 mg by mouth at bedtime.) 90 capsule 0  . valACYclovir (VALTREX) 1000 MG tablet Take 4,000 mg by mouth once as needed (fever blisters).    . zolpidem (AMBIEN) 5 MG tablet Take 5 mg by mouth at bedtime.    Marland Kitchen apixaban (ELIQUIS) 5 MG TABS tablet Take 1 tablet (5 mg total) by mouth 2 (two) times daily. 60 tablet 11  . atorvastatin (LIPITOR) 40 MG tablet Take 2 tablets (80 mg total) by mouth daily 180 tablet 3  . ezetimibe (ZETIA) 10 MG tablet Take 1 tablet (10 mg total) by mouth daily. 90 tablet 3  . furosemide (LASIX) 40 MG tablet TAKE 1 TABLET BY MOUTH  DAILY  AND MAY TAKE 1  ADDITIONAL TABLET DAILY AS  NEEDED FOR SWELLING 180 tablet 3  . metoprolol tartrate (LOPRESSOR) 50 MG tablet Take 1 tablet (50 mg total) by mouth 2 (two) times daily. You may take an extra 25 mg as need for palpitations 180 tablet 3  . Potassium Chloride ER 20 MEQ TBCR Take 10 mEq by mouth daily. 90 tablet 3   No current facility-administered medications for this visit.    Allergies:   Codeine phosphate, Morphine and related, Amoxicillin, Penicillins, and Lidocaine    Social History:  The patient  reports that she has never smoked. She has never used smokeless tobacco. She reports that she does not drink alcohol and does not use drugs.   Family History:  The patient's family history includes Arthritis/Rheumatoid in her father; Cancer in an other family member; Cirrhosis in her father; Colon cancer in her maternal grandmother; Diabetes in her brother and sister; Heart attack in her maternal aunt, maternal uncle, mother, and another family member; Heart disease in her brother, brother, maternal aunt, and mother; Throat cancer in her maternal aunt and maternal grandfather.    ROS:  Please see the history of present illness. Otherwise, review of systems are positive for none.   All other systems are reviewed and negative.    PHYSICAL EXAM: VS:  BP 122/70   Pulse 73   Ht 5\' 3"  (1.6 m)   Wt 227 lb 12.8 oz (103.3 kg)   SpO2 98%   BMI 40.35 kg/m  , BMI Body mass index is 40.35 kg/m.   General: Obese,  NAD Neck: Negative for carotid bruits. No JVD Lungs:Clear to ausculation bilaterally.  Breathing is unlabored. Cardiovascular: RRR with S1 S2. No murmurs Extremities: No edema. Radial pulses 2+ bilaterally Neuro: Alert and oriented. No focal deficits. No facial asymmetry. MAE spontaneously. Psych: Responds to questions appropriately with normal affect.     EKG:  EKG is ordered today. The ekg ordered today demonstrates NSR with HR 73bpm and no ST/T wave abnormalities.      Recent Labs: 10/26/2020: ALT 59; TSH 1.234 10/27/2020: Magnesium 1.5 10/28/2020: BUN 7; Creatinine, Ser 0.60; Hemoglobin 10.1; Platelets 304; Potassium 3.8; Sodium 135  Lipid Panel    Component Value Date/Time   CHOL 158 02/12/2019 0846   TRIG 103 02/12/2019 0846   HDL 93 02/12/2019 0846   CHOLHDL 1.7 02/12/2019 0846   CHOLHDL 2.3 03/12/2016 0252   VLDL 30 03/12/2016 0252   LDLCALC 44 02/12/2019 0846     Wt Readings from Last 3 Encounters:  12/09/20 227 lb 12.8 oz (103.3 kg)  10/28/20 230 lb 12.8 oz (104.7 kg)  04/25/20 225 lb (102.1 kg)    Other studies Reviewed: Additional studies/ records that were reviewed today include:  Review of the above records demonstrates:   Echocardiogram 10/27/20:  1. Left ventricular ejection fraction, by estimation, is 60 to 65%. The  left ventricle has normal function. The left ventricle has no regional  wall motion abnormalities. There is mild concentric left ventricular  hypertrophy. Left ventricular diastolic  parameters are consistent with Grade II diastolic dysfunction  (pseudonormalization).  2. Right ventricular systolic function is normal. The right ventricular  size is normal. There is normal pulmonary artery systolic pressure. The  estimated right ventricular systolic pressure is 26.9 mmHg.  3. Left atrial size was mildly dilated.  4. The mitral valve is grossly normal. Mild mitral valve regurgitation.  No evidence of mitral stenosis.  5. The aortic valve is tricuspid. Aortic valve regurgitation is trivial.  No aortic stenosis is present.  6. The inferior vena cava is dilated in size with <50% respiratory  variability, suggesting right atrial pressure of 15 mmHg.   Comparison(s): No significant change from prior study.   ASSESSMENT AND PLAN:  1.  New onset atrial fibrillation -EKG today with NSR>>rate stable at 73bpm however still reports intermittent palpitations. Will plan for ZIO monitor to quantify AF  burden>>may need EP referral  -Will continue metoprolol 50mg  BID and take 25mg  PRN for increased palpations -Once monitor returns, could consider diltiazem versus referral to EP for possible antiarrythmic therapy or ablation  -Reports snoring so will also need sleep study as well as part of her AF work up  -Continue Eliquis>>no issue with bleeding  -CHA2DS2VASc score, 6  2.  CAD with history of POBA/BMS: -Denies chest pain or other anginal symptoms  -Plan was to continue ASA in the setting of POBA per chart review -No c/o bleeding in stool of urine.   3.  Chronic diastolic CHF: -Myoview stress test in 2020 which was low risk with no ischemia.  Echocardiogram in 2018 with hyperdynamic LV function with grade 2 DD.  -Asking for repeat study despite no anginal symptoms>>consider at next follow up if patient prefers  -Continue Lasix 40mg  QD -Does not appear volume overloaded on exam   4.  HTN: -Stable, 122/70 -Continue current regimen   5. Recent gastroenteritis with concern for diverticulitis: -Likely the etiology of her new AF -Resolved   6. Carotid artery disease: -Mild on last dopplers>>2018 -Requesting repeat study today  -Continue with statin, beta blocker   7. HLD: -Last LDL, 44 from 02/12/2019 -Obtain lipid panel    Current medicines are reviewed at length with the patient today.  The patient does not have concerns regarding medicines.  The following changes have been made:  Add PRN metoprolol 25mg  for palpitations    Labs/ tests ordered today include: ZIO monitor 2-week   Orders Placed This Encounter  Procedures  . LONG TERM MONITOR (3-14 DAYS)  . EKG 12-Lead  . VAS US CAROTID    Disposition:   FU with Dr. Johney Frame or APP in 4 weeks  Signed, Kathyrn Drown, NP  12/09/2020 4:48 PM    [facility] [place], [place],   [address] Phone: [phone]; Fax: [phone]

## 2020-12-09 ENCOUNTER — Ambulatory Visit (INDEPENDENT_AMBULATORY_CARE_PROVIDER_SITE_OTHER): Payer: Medicare Other | Admitting: Cardiology

## 2020-12-09 ENCOUNTER — Other Ambulatory Visit: Payer: Self-pay

## 2020-12-09 ENCOUNTER — Ambulatory Visit (INDEPENDENT_AMBULATORY_CARE_PROVIDER_SITE_OTHER): Payer: Medicare Other

## 2020-12-09 ENCOUNTER — Encounter: Payer: Self-pay | Admitting: Cardiology

## 2020-12-09 VITALS — BP 122/70 | HR 73 | Ht 63.0 in | Wt 227.8 lb

## 2020-12-09 DIAGNOSIS — I4891 Unspecified atrial fibrillation: Secondary | ICD-10-CM

## 2020-12-09 DIAGNOSIS — I2510 Atherosclerotic heart disease of native coronary artery without angina pectoris: Secondary | ICD-10-CM

## 2020-12-09 DIAGNOSIS — R002 Palpitations: Secondary | ICD-10-CM

## 2020-12-09 DIAGNOSIS — I5032 Chronic diastolic (congestive) heart failure: Secondary | ICD-10-CM

## 2020-12-09 DIAGNOSIS — E782 Mixed hyperlipidemia: Secondary | ICD-10-CM | POA: Diagnosis not present

## 2020-12-09 DIAGNOSIS — E7849 Other hyperlipidemia: Secondary | ICD-10-CM | POA: Diagnosis not present

## 2020-12-09 DIAGNOSIS — I6523 Occlusion and stenosis of bilateral carotid arteries: Secondary | ICD-10-CM | POA: Diagnosis not present

## 2020-12-09 DIAGNOSIS — I10 Essential (primary) hypertension: Secondary | ICD-10-CM

## 2020-12-09 MED ORDER — EZETIMIBE 10 MG PO TABS: 10.0000 mg | ORAL_TABLET | Freq: Every day | ORAL | 3 refills | Status: DC

## 2020-12-09 MED ORDER — APIXABAN 5 MG PO TABS: 5.0000 mg | ORAL_TABLET | Freq: Two times a day (BID) | ORAL | 11 refills | Status: DC

## 2020-12-09 MED ORDER — FUROSEMIDE 40 MG PO TABS: ORAL_TABLET | ORAL | 3 refills | Status: DC

## 2020-12-09 MED ORDER — POTASSIUM CHLORIDE ER 20 MEQ PO TBCR: 10.0000 meq | EXTENDED_RELEASE_TABLET | Freq: Every day | ORAL | 3 refills | Status: DC

## 2020-12-09 MED ORDER — METOPROLOL TARTRATE 50 MG PO TABS: 50.0000 mg | ORAL_TABLET | Freq: Two times a day (BID) | ORAL | 3 refills | Status: DC

## 2020-12-09 MED ORDER — ATORVASTATIN CALCIUM 40 MG PO TABS: ORAL_TABLET | ORAL | 3 refills | Status: DC

## 2020-12-09 NOTE — Patient Instructions (Signed)
Medication Instructions:  Your physician has recommended you make the following change in your medication: 1. YOU MAY TAKE AN EXTRA LASIX 25 MG AS NEEDED FOR PALPITATIONS   *If you need a refill on your cardiac medications before your next appointment, please call your pharmacy*   Lab Work: NONE If you have labs (blood work) drawn today and your tests are completely normal, you will receive your results only by: Marland Kitchen MyChart Message (if you have MyChart) OR . A paper copy in the mail If you have any lab test that is abnormal or we need to change your treatment, we will call you to review the results.   Testing/Procedures: [place]- Long Term Monitor Instructions  Your physician has requested you wear a ZIO patch monitor for 14 days.  This is a single patch monitor. [employer] supplies one patch monitor per enrollment. Additional stickers are not available. Please do not apply patch if you will be having a Nuclear Stress Test,  Echocardiogram, Cardiac CT, MRI, or Chest Xray during the period you would be wearing the  monitor. The patch cannot be worn during these tests. You cannot remove and re-apply the  ZIO XT patch monitor.  Your ZIO patch monitor will be mailed 3 day USPS to your address on file. It may take 3-5 days  to receive your monitor after you have been enrolled.  Once you have received your monitor, please review the enclosed instructions. Your monitor  has already been registered assigning a specific monitor serial # to you.  Billing and Patient Assistance Program Information  We have supplied [employer] with any of your insurance information on file for billing purposes. [employer] offers a sliding scale Patient Assistance Program for patients that do not have  insurance, or whose insurance does not completely cover the cost of the ZIO monitor.  You must apply for the Patient Assistance Program to qualify for this discounted rate.  To apply, please call [employer] at [phone],  select option 4, select option 2, ask to apply for  Patient Assistance Program. Theodore Demark will ask your household income, and how many people  are in your household. They will quote your out-of-pocket cost based on that information.  [employer] will also be able to set up a 97-month, interest-free payment plan if needed.  Applying the monitor   Shave hair from upper left chest.  Hold abrader disc by orange tab. Rub abrader in 40 strokes over the upper left chest as  indicated in your monitor instructions.  Clean area with 4 enclosed alcohol pads. Let dry.  Apply patch as indicated in monitor instructions. Patch will be placed under collarbone on left  side of chest with arrow pointing upward.  Rub patch adhesive wings for 2 minutes. Remove white label marked "1". Remove the white  label marked "2". Rub patch adhesive wings for 2 additional minutes.  While looking in a mirror, press and release button in center of patch. A small green light will  flash 3-4 times. This will be your only indicator that the monitor has been turned on.  Do not shower for the first 24 hours. You may shower after the first 24 hours.  Press the button if you feel a symptom. You will hear a small click. Record Date, Time and  Symptom in the Patient Logbook.  When you are ready to remove the patch, follow instructions on the last 2 pages of Patient  Logbook. Stick patch monitor onto the last page of Patient Logbook.  Place Patient Logbook in the blue and white box. Use locking tab on box and tape box closed  securely. The blue and white box has prepaid postage on it. Please place it in the mailbox as  soon as possible. Your physician should have your test results approximately 7 days after the  monitor has been mailed back to [facility].  Call [place] at [phone] if you have questions regarding  your ZIO XT patch monitor. Call them immediately if you see an orange light blinking on your   monitor.  If your monitor falls off in less than 4 days, contact our Monitor department at [phone].  If your monitor becomes loose or falls off after 4 days call [employer] at [phone] for  suggestions on securing your monitor  Your physician has requested that you have a carotid duplex. This test is an ultrasound of the carotid arteries in your neck. It looks at blood flow through these arteries that supply the brain with blood. Allow one hour for this exam. There are no restrictions or special instructions.   Follow-Up: At [facility], you and your health needs are our priority.  As part of our continuing mission to provide you with exceptional heart care, we have created designated Provider Care Teams.  These Care Teams include your primary Cardiologist (physician) and Advanced Practice Providers (APPs -  Physician Assistants and Nurse Practitioners) who all work together to provide you with the care you need, when you need it.  We recommend signing up for the patient portal called "MyChart".  Sign up information is provided on this After Visit Summary.  MyChart is used to connect with patients for Virtual Visits (Telemedicine).  Patients are able to view lab/test results, encounter notes, upcoming appointments, etc.  Non-urgent messages can be sent to your provider as well.   To learn more about what you can do with MyChart, go to [email or web address].    Your next appointment:   3-4 week(s)  The format for your next appointment:   In Person  Provider:   You may see DR. PEMBERTON  or one of the following Advanced Practice Providers on your designated Care Team:    [name], PA-C  [place], [place]  Kathyrn Drown, [place]

## 2020-12-09 NOTE — Progress Notes (Unsigned)
Enrolled patient for a 14 day Zio XT  monitor to be mailed to patients home  °

## 2020-12-10 ENCOUNTER — Other Ambulatory Visit: Payer: Self-pay | Admitting: Cardiology

## 2020-12-10 DIAGNOSIS — I2510 Atherosclerotic heart disease of native coronary artery without angina pectoris: Secondary | ICD-10-CM

## 2020-12-10 MED ORDER — EZETIMIBE 10 MG PO TABS: 10.0000 mg | ORAL_TABLET | Freq: Every day | ORAL | 3 refills | Status: DC

## 2020-12-10 MED ORDER — RAMIPRIL 2.5 MG PO CAPS: 2.5000 mg | ORAL_CAPSULE | Freq: Every day | ORAL | 3 refills | Status: DC

## 2020-12-10 MED ORDER — APIXABAN 5 MG PO TABS: 5.0000 mg | ORAL_TABLET | Freq: Two times a day (BID) | ORAL | 2 refills | Status: DC

## 2020-12-10 MED ORDER — METOPROLOL TARTRATE 50 MG PO TABS: 50.0000 mg | ORAL_TABLET | Freq: Two times a day (BID) | ORAL | 3 refills | Status: DC

## 2020-12-10 MED ORDER — FUROSEMIDE 40 MG PO TABS: ORAL_TABLET | ORAL | 3 refills | Status: DC

## 2020-12-10 MED ORDER — POTASSIUM CHLORIDE ER 20 MEQ PO TBCR: 10.0000 meq | EXTENDED_RELEASE_TABLET | Freq: Every day | ORAL | 3 refills | Status: DC

## 2020-12-10 MED ORDER — ATORVASTATIN CALCIUM 40 MG PO TABS: ORAL_TABLET | ORAL | 3 refills | Status: DC

## 2020-12-10 MED ORDER — NITROGLYCERIN 0.4 MG SL SUBL: SUBLINGUAL_TABLET | SUBLINGUAL | 2 refills | Status: DC

## 2020-12-10 NOTE — Telephone Encounter (Signed)
Pt's medications were resent to preferred pharmacy [facility] mail order pharmacy. Please resend Eliquis as well. Confirmation received. Thanks

## 2020-12-10 NOTE — Telephone Encounter (Signed)
*  STAT* If patient is at the pharmacy, call can be transferred to refill team.   1. Which medications need to be refilled? (please list name of each medication and dose if known)   Patient states all her medications that were sent to [facility] yesterday were supposed to be sent to [facility]. She states she does not know the names, but thinks it was 5 or 6 prescriptions.  2. Which pharmacy/location (including street and city if local pharmacy) is medication to be sent to? [place], [place], [address]  3. Do they need a 30 day or 90 day supply? 90 day   Patient would like a call back when it has been sent to make sure they are sent to the right pharmacy.

## 2020-12-10 NOTE — Telephone Encounter (Signed)
Pt last saw Kathyrn Drown, NP on 12/10/20, last labs 10/28/20 Creat 0.60, age 70, weight 103.3, based on specified criteria pt is on appropriate dosage of Eliquis 5mg  BID for afib.  Will refill rx.

## 2020-12-11 ENCOUNTER — Telehealth: Payer: Self-pay | Admitting: Cardiology

## 2020-12-11 NOTE — Telephone Encounter (Signed)
I left a message for the patient to return my call.  Per Sharee Pimple okay to wait until next visit to do stress test.

## 2020-12-11 NOTE — Telephone Encounter (Signed)
I see you mentioned considering a stress test at next visit.  Pt was thinking you wanted to do this now.  Do you want to go ahead and have her do this or wait?

## 2020-12-11 NOTE — Telephone Encounter (Signed)
New Message:     Pt wanted to know if she is supposed to have a Stress Test. She said her and Sharee Pimple had discussed it, she thought she was going to do it.

## 2020-12-15 DIAGNOSIS — Z79899 Other long term (current) drug therapy: Secondary | ICD-10-CM | POA: Diagnosis not present

## 2020-12-15 DIAGNOSIS — R002 Palpitations: Secondary | ICD-10-CM

## 2020-12-15 DIAGNOSIS — I4891 Unspecified atrial fibrillation: Secondary | ICD-10-CM | POA: Diagnosis not present

## 2020-12-16 NOTE — Telephone Encounter (Signed)
Patient call back to  schedule stress test explain to patient from the notes says she could wait till her next visit.

## 2020-12-16 NOTE — Telephone Encounter (Signed)
Left message to call back regarding stress test.

## 2020-12-18 NOTE — Telephone Encounter (Signed)
Pt aware needs an appt .Appt made for 03/23/21 at 8:15 and with [name] [facility] to discuss if needs stress test per Sharee Pimple McDaniel's NP last office note ./cy

## 2021-01-02 DIAGNOSIS — I4891 Unspecified atrial fibrillation: Secondary | ICD-10-CM | POA: Diagnosis not present

## 2021-01-02 DIAGNOSIS — R002 Palpitations: Secondary | ICD-10-CM | POA: Diagnosis not present

## 2021-01-08 DIAGNOSIS — E7849 Other hyperlipidemia: Secondary | ICD-10-CM | POA: Diagnosis not present

## 2021-01-08 DIAGNOSIS — I10 Essential (primary) hypertension: Secondary | ICD-10-CM | POA: Diagnosis not present

## 2021-01-08 DIAGNOSIS — I2510 Atherosclerotic heart disease of native coronary artery without angina pectoris: Secondary | ICD-10-CM | POA: Diagnosis not present

## 2021-01-19 ENCOUNTER — Ambulatory Visit (HOSPITAL_COMMUNITY)
Admission: RE | Admit: 2021-01-19 | Discharge: 2021-01-19 | Disposition: A | Discharge status: Home / Self Care | Payer: Medicare Other | Source: Ambulatory Visit | Attending: Cardiology | Admitting: Cardiology

## 2021-01-19 ENCOUNTER — Other Ambulatory Visit: Payer: Self-pay

## 2021-01-19 DIAGNOSIS — I6523 Occlusion and stenosis of bilateral carotid arteries: Secondary | ICD-10-CM

## 2021-02-08 DIAGNOSIS — E782 Mixed hyperlipidemia: Secondary | ICD-10-CM | POA: Diagnosis not present

## 2021-02-08 DIAGNOSIS — I2510 Atherosclerotic heart disease of native coronary artery without angina pectoris: Secondary | ICD-10-CM | POA: Diagnosis not present

## 2021-02-08 DIAGNOSIS — I10 Essential (primary) hypertension: Secondary | ICD-10-CM | POA: Diagnosis not present

## 2021-03-09 DIAGNOSIS — Z6841 Body Mass Index (BMI) 40.0 and over, adult: Secondary | ICD-10-CM | POA: Diagnosis not present

## 2021-03-09 DIAGNOSIS — I10 Essential (primary) hypertension: Secondary | ICD-10-CM | POA: Diagnosis not present

## 2021-03-09 DIAGNOSIS — E782 Mixed hyperlipidemia: Secondary | ICD-10-CM | POA: Diagnosis not present

## 2021-03-09 DIAGNOSIS — Z0000 Encounter for general adult medical examination without abnormal findings: Secondary | ICD-10-CM | POA: Diagnosis not present

## 2021-03-09 DIAGNOSIS — K219 Gastro-esophageal reflux disease without esophagitis: Secondary | ICD-10-CM | POA: Diagnosis not present

## 2021-03-09 DIAGNOSIS — Z1211 Encounter for screening for malignant neoplasm of colon: Secondary | ICD-10-CM | POA: Diagnosis not present

## 2021-03-09 DIAGNOSIS — K5732 Diverticulitis of large intestine without perforation or abscess without bleeding: Secondary | ICD-10-CM | POA: Diagnosis not present

## 2021-03-09 DIAGNOSIS — I4891 Unspecified atrial fibrillation: Secondary | ICD-10-CM | POA: Diagnosis not present

## 2021-03-09 DIAGNOSIS — M0689 Other specified rheumatoid arthritis, multiple sites: Secondary | ICD-10-CM | POA: Diagnosis not present

## 2021-03-22 NOTE — Progress Notes (Signed)
Cardiology Office Note:    Date:  03/23/2021   ID:  [name], DOB 21-Sep-1950, MRN [id number]  PCP:  Redmond School, MD   [facility] Providers Cardiologist:  Freada Bergeron, MD      Referring MD: Redmond School, MD   Chief Complaint:  F/u for AFib, CAD    Patient Profile:    Cindy Holmes is a 70 y.o. female with:  Coronary artery disease  S/p BMS to LAD and POBA to D1 in 2001 S/p POBA to D1 in 2002 Cath 9/17: Moderate in-stent restenosis of proximal LAD (FFR negative) Myoview 05/2019: Low risk Diastolic CHF  Echo 0000000: EF 65-70, GR 2 DD Paroxysmal atrial fibrillation  Paroxysmal SVT Hypertension  Hyperlipidemia  Morbid obesity Prior CVA/TIA Systemic lupus erythematosus  S/p fall in 2019 >> multiple fx's >> DC to rehab   Prior CV studies:  Carotid US 01/19/21 Bilateral ICA 1-39   LONG TERM MONITOR (8-14 DAYS) INTERPRETATION 01/02/2021 Narrative  Patch wear time was 12 days and 11 hours  Predominant rhythm was NSR with average HR 70bpm (ranging from 50-174bpm)  There were 368 episodes of SVT with the longest lasting 16.2 seconds at average rate 120bpm; fastest was 13 beats at max rate of 174bpm  Occasional SVE (2.3%), rare PVCs (<1%)  No AFib, VT or significant pauses  Echocardiogram 10/27/20 EF 60-65, no RWMA, mild LVH, GRII DD, normal RVSF, RVSP 31.1, mild LAE, mild MR, trivial AI  Carotid US 05/22/2019 Bilateral ICA 1-39   Myoview 05/22/2019 EF 86, no ischemia or infarction, low risk   Renal artery [place] 01/27/2017 Normal renal arteries, bilaterally Normal caliber abdominal aorta   Echocardiogram 12/03/2016 EF 65-70, normal wall motion, GR 2 DD, mild LAE   Cardiac catheterization 03/12/2016 LM normal LAD proximal stent patent w 40 ISR (FFR 0.84), dist stent patent w 10 ISR; D1 ost 30 LCx patent  RCA patent  EF 55-65  History of Present Illness: Cindy Holmes was admitted in 10/2020 with diverticulitis c/b AF w RVR.  She  spontaneously converted to NSR and was started on Apixaban.  She was last seen in clinic by Kathyrn Drown, NP in 5/22.  She had symptoms of palpitations and a f/u monitory showed several episodes of SVT but no AFib.  Of note, there was discussion of +/- stress test.  She returns for f/u.  She is here alone.  She has several questions today.  She is also not happy that she is seeing a PA again.  She prefers to see the cardiologist.  She notes that she is currently not sure who her cardiologist will be going forward.  It appears that she has been assigned to Dr. Johney Frame.  There was an incidental finding of an enlarged lymph node on the left on her carotid ultrasound.  She did not have any infectious symptoms around the time of her ultrasound.  She does have a history of lupus.  She has been having chest pain.  It is not clear how long this has been going on.  She is fairly sedentary due to her previous injury.  She has not really had any exertional symptoms.  She does feel short of breath at times.  She did not have chest pain prior to her PCI in the past.  However, she did have shortness of breath.  She has not had associated arm or jaw pain.  She has not had associated nausea or diaphoresis.  She continues to have palpitations.  These may occur 3 times a week and then not for several weeks in a row.  She is also concerned about the cost of Apixaban.    Past Medical History:  Diagnosis Date   CAD (coronary artery disease)    a. PTCA LAD 2001/cath 2002-prox and mid LAD stents patent, PTCA ostium Dx 1 b. cath 03/2016: patent LAD stent with less than 40% in-stent restenosis, 10-30% stenosis of D1 and distal LAD with continued medical management recommended.   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    Current Medications: Current Meds  Medication Sig   acetaminophen (TYLENOL) 500 MG tablet Take 1,000 mg by mouth every 6 (six) hours as needed for headache (pain).   allopurinol (ZYLOPRIM) 100 MG tablet Take 400 mg by mouth at bedtime.   apixaban (ELIQUIS) 5 MG TABS tablet Take 1 tablet (5 mg total) by mouth 2 (two) times daily.   aspirin EC 81 MG tablet Take 81 mg by mouth at bedtime. Swallow whole.   atorvastatin (LIPITOR) 40 MG tablet Take 2 tablets (80 mg total) by mouth daily   bisacodyl (DULCOLAX) 5 MG EC tablet Take 5 mg by mouth at bedtime as needed for moderate constipation.   diphenhydramine-acetaminophen (TYLENOL PM) 25-500 MG TABS tablet Take 1 tablet by mouth at bedtime.   ezetimibe (ZETIA) 10 MG tablet Take 1 tablet (10 mg total) by mouth daily.   folic acid (FOLVITE) 1 MG tablet Take 1 mg by mouth at bedtime.   furosemide (LASIX) 40 MG tablet TAKE 1 TABLET BY MOUTH  DAILY AND MAY TAKE 1  ADDITIONAL TABLET DAILY AS  NEEDED FOR SWELLING   hydroxychloroquine (PLAQUENIL) 200 MG tablet Take 200 mg by mouth at bedtime.   levocetirizine (XYZAL) 5 MG tablet Take 5 mg by mouth at bedtime.   Melatonin 3 MG TABS Take 3 mg by mouth at bedtime as needed (sleep).   methotrexate (RHEUMATREX) 2.5 MG tablet Take 10 mg by mouth every Sunday.   metoprolol tartrate (LOPRESSOR) 25 MG tablet Take 1 tablet (25 mg total) by mouth 2 (two) times daily. To be taken with one tablet by mouth ( 50 mg ) 2 (two) times daily = ( 75 mg) total.   nitroGLYCERIN (NITROSTAT) 0.4 MG SL tablet DISSOLVE 1 TABLET UNDER THE TONGUE  EVERY 5 MINUTES AS  NEEDED (NOT TO EXCEED 3  TABLETS IN 15 MINS, CALL  911 IF CHEST PAIN PERSISTS   Omega-3 Fatty Acids (FISH OIL) 1000 MG CAPS Take 1,000 mg by mouth at bedtime.   Potassium Chloride ER 20 MEQ TBCR Take 10 mEq by mouth daily.   predniSONE (DELTASONE) 5 MG tablet Take 5 mg by mouth every morning.   ramipril (ALTACE) 2.5 MG capsule Take 1 capsule (2.5 mg total) by mouth daily.  valACYclovir (VALTREX) 1000 MG tablet Take 4,000 mg by mouth once as needed (fever blisters).   zolpidem (AMBIEN) 5 MG tablet Take 5 mg by mouth at bedtime.   [DISCONTINUED] metoprolol tartrate (LOPRESSOR) 50 MG tablet Take 1 tablet (50 mg total) by mouth 2 (two) times daily. You may take an extra 25 mg as need for palpitations     Allergies:   Codeine phosphate, Morphine and related, Amoxicillin, Penicillins, and Lidocaine   Social History   Tobacco Use   Smoking status: Never   Smokeless tobacco: Never  Vaping Use   Vaping Use: Never used  Substance Use Topics   Alcohol use: No    Alcohol/week: 0.0 standard drinks   Drug use: No     Family Hx: The patient's family history includes Arthritis/Rheumatoid in her father; Cancer in an other family member; Cirrhosis in her father; Colon cancer in her maternal grandmother; Diabetes in her brother and sister; Heart attack in her maternal aunt, maternal uncle, mother, and another family member; Heart disease in her brother, brother, maternal aunt, and mother; Throat cancer in her maternal aunt and maternal grandfather.  Review of Systems  Constitutional: Negative for fever.  Respiratory:  Negative for cough.     EKGs/Labs/Other Test Reviewed:    EKG:  EKG is   ordered today.  The ekg ordered today demonstrates NSR, HR 60, normal axis, nonspecific ST-T wave changes, QTC 442, similar to prior tracings.  Recent Labs: 10/26/2020: ALT 59; TSH 1.234 10/27/2020: Magnesium 1.5 10/28/2020: BUN 7; Creatinine, Ser 0.60; Hemoglobin 10.1; Platelets 304;  Potassium 3.8; Sodium 135   Recent Lipid Panel Lab Results  Component Value Date/Time   CHOL 158 02/12/2019 08:46 AM   TRIG 103 02/12/2019 08:46 AM   HDL 93 02/12/2019 08:46 AM   LDLCALC 44 02/12/2019 08:46 AM     Risk Assessment/Calculations:    CHA2DS2-VASc Score = 7   This indicates a 11.2% annual risk of stroke. The patient's score is based upon: CHF History: 1 HTN History: 1 Diabetes History: 0 Stroke History: 2 Vascular Disease History: 1 Age Score: 1 Gender Score: 1        Physical Exam:    VS:  BP (!) 120/58   Pulse 60   Ht '5\' 3"'$  (1.6 m)   Wt 232 lb (105.2 kg)   SpO2 95%   BMI 41.10 kg/m     Wt Readings from Last 3 Encounters:  03/23/21 232 lb (105.2 kg)  12/09/20 227 lb 12.8 oz (103.3 kg)  10/28/20 230 lb 12.8 oz (104.7 kg)     Constitutional:      Appearance: Healthy appearance. Not in distress.  Pulmonary:     Effort: Pulmonary effort is normal.     Breath sounds: No wheezing. No rales.  Cardiovascular:     Normal rate. Regular rhythm. Normal S1. Normal S2.      Murmurs: There is no murmur.  Edema:    Peripheral edema present.    Pretibial: bilateral trace edema of the pretibial area. Abdominal:     Palpations: Abdomen is soft.  Musculoskeletal:     Cervical back: Neck supple. Skin:    General: Skin is warm and dry.  Neurological:     Mental Status: Alert and oriented to person, place and time.     Cranial Nerves: Cranial nerves are intact.         ASSESSMENT & PLAN:    1. PAF (paroxysmal atrial fibrillation) (HCC) No recurrent atrial fibrillation on  recent event monitor.  However, her risk for stroke is significantly elevated.  She will need to remain on anticoagulation long-term.  She is having difficulty affording apixaban.  I have advised her that we can switch her over to Coumadin.  She would like to think about this.  Continue apixaban 5 mg twice daily.  2. PSVT (paroxysmal supraventricular tachycardia) ([place]) She continues to  have symptoms of SVT.  Her longest episode was 16 seconds on her recent monitor.  I have recommended that we increase her metoprolol to tartrate to 75 mg twice daily.  With her blood pressure and heart rate, I do not think she will be able to tolerate much more of an increase.  Therefore, if she continues to have symptoms despite this, I have asked her to contact me.  At that point, I will refer her to EP.  3. Coronary artery disease involving native coronary artery of native heart without angina pectoris 4. Precordial chest pain History of prior BMS to the LAD and angioplasty to the D1 in 2001 and balloon angioplasty to the D1 in 2002.  She had moderate in-stent restenosis of the proximal LAD with negative FFR by cardiac catheterization in 2017.  She had a low risk Myoview in 2020.  She is somewhat concerned that she has not been having a yearly nuclear stress test.  I did try to explain to her that most of the time, we base whether or not to proceed with stress testing on symptoms.  As she is having chest discomfort, I will arrange a Lexiscan Myoview.  If this is low risk, she will need to follow-up with primary care to assess for other causes of chest pain.  Continue aspirin 81 mg daily, atorvastatin 80 mg daily, ezetimibe 10 mg daily, metoprolol tartrate 75 mg twice daily.  Follow-up with Dr. Johney Frame in 3 months.  5. Chronic heart failure with preserved ejection fraction (HCC) EF normal by echocardiogram in 4/22.  Volume status appears stable.  She is NYHA IIb.  6. Essential hypertension Blood pressure is well controlled.  Continue metoprolol tartrate 75 mg twice daily, ramipril 2.5 mg daily.  7. Mixed hyperlipidemia Continue atorvastatin 80 mg daily.  8. Bilateral carotid artery stenosis Minimal plaque on recent ultrasound.  I reviewed the findings with her today.  9. Enlarged lymph node in neck She did have an enlarged lymph node on her carotid ultrasound on the left.  I have asked her to  follow-up with primary care.  We will send a copy of the carotid ultrasound report to her PCP to ensure appropriate follow-up.     Shared Decision Making/Informed Consent The risks [chest pain, shortness of breath, cardiac arrhythmias, dizziness, blood pressure fluctuations, myocardial infarction, stroke/transient ischemic attack, nausea, vomiting, allergic reaction, radiation exposure, metallic taste sensation and life-threatening complications (estimated to be 1 in 10,000)], benefits (risk stratification, diagnosing coronary artery disease, treatment guidance) and alternatives of a nuclear stress test were discussed in detail with Cindy Holmes and she agrees to proceed.    Dispo:  Return in about 4 months (around 07/23/2021) for Routine Follow Up, w/ Dr. Johney Frame.   Medication Adjustments/Labs and Tests Ordered: Current medicines are reviewed at length with the patient today.  Concerns regarding medicines are outlined above.  Tests Ordered: Orders Placed This Encounter  Procedures   Cardiac Stress Test: Informed Consent Details: Physician/Practitioner Attestation; Transcribe to consent form and obtain patient signature   Myocardial Perfusion Imaging   EKG 12-Lead    Medication  Changes: Meds ordered this encounter  Medications   metoprolol tartrate (LOPRESSOR) 50 MG tablet    Sig: Take 1 tablet (50 mg total) by mouth 2 (two) times daily. To be taken with one tablet by mouth ( 25 mg ) 2 (two) times daily = ( 75 mg) total.    Dispense:  180 tablet    Refill:  3   metoprolol tartrate (LOPRESSOR) 25 MG tablet    Sig: Take 1 tablet (25 mg total) by mouth 2 (two) times daily. To be taken with one tablet by mouth ( 50 mg ) 2 (two) times daily = ( 75 mg) total.    Dispense:  180 tablet    Refill:  3     Signed, [name], PA-C  03/23/2021 5:12 PM    [facility] [address], [place], [place]  [address] Phone: [phone]; Fax: [phone]

## 2021-03-23 ENCOUNTER — Other Ambulatory Visit: Payer: Self-pay

## 2021-03-23 ENCOUNTER — Telehealth (HOSPITAL_COMMUNITY): Payer: Self-pay | Admitting: *Deleted

## 2021-03-23 ENCOUNTER — Encounter: Payer: Self-pay | Admitting: Physician Assistant

## 2021-03-23 ENCOUNTER — Ambulatory Visit (INDEPENDENT_AMBULATORY_CARE_PROVIDER_SITE_OTHER): Payer: Medicare Other | Admitting: Physician Assistant

## 2021-03-23 VITALS — BP 120/58 | HR 60 | Ht 63.0 in | Wt 232.0 lb

## 2021-03-23 DIAGNOSIS — R079 Chest pain, unspecified: Secondary | ICD-10-CM

## 2021-03-23 DIAGNOSIS — I2510 Atherosclerotic heart disease of native coronary artery without angina pectoris: Secondary | ICD-10-CM

## 2021-03-23 DIAGNOSIS — I5032 Chronic diastolic (congestive) heart failure: Secondary | ICD-10-CM | POA: Diagnosis not present

## 2021-03-23 DIAGNOSIS — R072 Precordial pain: Secondary | ICD-10-CM | POA: Diagnosis not present

## 2021-03-23 DIAGNOSIS — I471 Supraventricular tachycardia: Secondary | ICD-10-CM | POA: Diagnosis not present

## 2021-03-23 DIAGNOSIS — E782 Mixed hyperlipidemia: Secondary | ICD-10-CM

## 2021-03-23 DIAGNOSIS — I480 Paroxysmal atrial fibrillation: Secondary | ICD-10-CM

## 2021-03-23 DIAGNOSIS — I6523 Occlusion and stenosis of bilateral carotid arteries: Secondary | ICD-10-CM | POA: Diagnosis not present

## 2021-03-23 DIAGNOSIS — I10 Essential (primary) hypertension: Secondary | ICD-10-CM

## 2021-03-23 DIAGNOSIS — R590 Localized enlarged lymph nodes: Secondary | ICD-10-CM

## 2021-03-23 MED ORDER — METOPROLOL TARTRATE 25 MG PO TABS: 25.0000 mg | ORAL_TABLET | Freq: Two times a day (BID) | ORAL | 3 refills | Status: DC

## 2021-03-23 MED ORDER — METOPROLOL TARTRATE 50 MG PO TABS: 50.0000 mg | ORAL_TABLET | Freq: Two times a day (BID) | ORAL | 3 refills | Status: DC

## 2021-03-23 NOTE — Telephone Encounter (Signed)
Patient given detailed instructions per Myocardial Perfusion Study Information Sheet for the test on 03/30/21 at 7:30. Patient notified to arrive 15 minutes early and that it is imperative to arrive on time for appointment to keep from having the test rescheduled.  If you need to cancel or reschedule your appointment, please call the office within 24 hours of your appointment. . Patient verbalized understanding.Cindy Holmes

## 2021-03-23 NOTE — Patient Instructions (Signed)
Medication Instructions:   INCREASE Metoprolol one tablet ( 25 mg) and one tablet (50 mg) by mouth = ( 75 mg) twice daily.   *If you need a refill on your cardiac medications before your next appointment, please call your pharmacy*  Lab Work:  -NONE  If you have labs (blood work) drawn today and your tests are completely normal, you will receive your results only by: [place] (if you have MyChart) OR A paper copy in the mail If you have any lab test that is abnormal or we need to change your treatment, we will call you to review the results.   Testing/Procedures: You are scheduled for a Myocardial Perfusion Imaging Study on Monday, September  19 at 7:30 am.    Please arrive 15 minutes prior to your appointment time for registration and insurance purposes.   The test will take approximately 3 to 4 hours to complete; you may bring reading material. If someone comes with you to your appointment, they will need to remain in the main lobby due to limited space in the testing area.   How to prepare for your Myocardial Perfusion test:   Do not eat or drink 3 hours prior to your test, except you may have water.    Do not consume products containing caffeine (regular or decaffeinated) 12 hours prior to your test (ex: coffee, chocolate, soda, tea)   Do bring a list of your current medications with you. If not listed below, you may take your medications as normal.   Bring any held medication to your appointment, as you may be required to take it once the test is complete.   Do wear comfortable clothes (no dresses ) and walking shoes. Tennis shoes are preferred. No heels or open toed shoes.  Do not wear perfume, or lotions (deodorant is allowed).   If these instructions are not followed, you test will have to be rescheduled.   Please report to [address] for your test. If you have questions or concerns about your appointment, please call the Nuclear Lab at  [phone].  If you cannot keep your appointment, please provide 24 hour notification to the Nuclear lab to avoid a possible $50 charge to your account.    Follow-Up: At [facility], you and your health needs are our priority.  As part of our continuing mission to provide you with exceptional heart care, we have created designated Provider Care Teams.  These Care Teams include your primary Cardiologist (physician) and Advanced Practice Providers (APPs -  Physician Assistants and Nurse Practitioners) who all work together to provide you with the care you need, when you need it.  We recommend signing up for the patient portal called "MyChart".  Sign up information is provided on this After Visit Summary.  MyChart is used to connect with patients for Virtual Visits (Telemedicine).  Patients are able to view lab/test results, encounter notes, upcoming appointments, etc.  Non-urgent messages can be sent to your provider as well.   To learn more about what you can do with MyChart, go to [email or web address].    Your next appointment:   4 month(s)  The format for your next appointment:   In Person  Provider:   Gwyndolyn Kaufman, MD   Other Instructions  Please follow up with your PCP for enlarged left cervical lymphnode .  Copy of test and note sent to PCP.  Please call office at [phone] or send mychart message in your palpitations continue.

## 2021-03-30 ENCOUNTER — Other Ambulatory Visit: Payer: Self-pay

## 2021-03-30 ENCOUNTER — Ambulatory Visit (HOSPITAL_COMMUNITY): Payer: Medicare Other | Attending: Cardiovascular Disease

## 2021-03-30 DIAGNOSIS — R079 Chest pain, unspecified: Secondary | ICD-10-CM | POA: Diagnosis not present

## 2021-03-30 LAB — MYOCARDIAL PERFUSION IMAGING
LV dias vol: 71 mL (ref 46–106)
LV sys vol: 16 mL
Nuc Stress EF: 78 %
Peak HR: 84 {beats}/min
Rest HR: 81 {beats}/min
Rest Nuclear Isotope Dose: 10.3 mCi
SDS: 2
SRS: 0
SSS: 2
ST Depression (mm): 0 mm
Stress Nuclear Isotope Dose: 31.7 mCi
TID: 1.05

## 2021-03-30 MED ORDER — TECHNETIUM TC 99M TETROFOSMIN IV KIT
10.3000 | PACK | Freq: Once | INTRAVENOUS | Status: AC | PRN
  Administered 2021-03-30: 10.3 via INTRAVENOUS
  Filled 2021-03-30: qty 11

## 2021-03-30 MED ORDER — TECHNETIUM TC 99M TETROFOSMIN IV KIT
31.7000 | PACK | Freq: Once | INTRAVENOUS | Status: AC | PRN
Start: 2021-03-30 — End: 2021-03-30
  Administered 2021-03-30: 31.7 via INTRAVENOUS
  Filled 2021-03-30: qty 32

## 2021-03-30 MED ORDER — REGADENOSON 0.4 MG/5ML IV SOLN
0.4000 mg | Freq: Once | INTRAVENOUS | Status: AC
  Administered 2021-03-30: 0.4 mg via INTRAVENOUS

## 2021-03-31 ENCOUNTER — Encounter: Payer: Self-pay | Admitting: Physician Assistant

## 2021-03-31 DIAGNOSIS — S82141D Displaced bicondylar fracture of right tibia, subsequent encounter for closed fracture with routine healing: Secondary | ICD-10-CM | POA: Diagnosis not present

## 2021-03-31 DIAGNOSIS — S82851D Displaced trimalleolar fracture of right lower leg, subsequent encounter for closed fracture with routine healing: Secondary | ICD-10-CM | POA: Diagnosis not present

## 2021-05-06 DIAGNOSIS — J019 Acute sinusitis, unspecified: Secondary | ICD-10-CM | POA: Diagnosis not present

## 2021-05-11 DIAGNOSIS — I10 Essential (primary) hypertension: Secondary | ICD-10-CM | POA: Diagnosis not present

## 2021-05-11 DIAGNOSIS — E782 Mixed hyperlipidemia: Secondary | ICD-10-CM | POA: Diagnosis not present

## 2021-05-11 DIAGNOSIS — I2510 Atherosclerotic heart disease of native coronary artery without angina pectoris: Secondary | ICD-10-CM | POA: Diagnosis not present

## 2021-05-12 ENCOUNTER — Other Ambulatory Visit: Payer: Self-pay | Admitting: Cardiology

## 2021-06-07 DIAGNOSIS — Z23 Encounter for immunization: Secondary | ICD-10-CM | POA: Diagnosis not present

## 2021-06-22 DIAGNOSIS — M1712 Unilateral primary osteoarthritis, left knee: Secondary | ICD-10-CM | POA: Diagnosis not present

## 2021-06-22 DIAGNOSIS — S82141D Displaced bicondylar fracture of right tibia, subsequent encounter for closed fracture with routine healing: Secondary | ICD-10-CM | POA: Diagnosis not present

## 2021-06-22 DIAGNOSIS — S82851D Displaced trimalleolar fracture of right lower leg, subsequent encounter for closed fracture with routine healing: Secondary | ICD-10-CM | POA: Diagnosis not present

## 2021-07-27 NOTE — Progress Notes (Deleted)
Cardiology Office Note:    Date:  07/27/2021   ID:  Cindy Holmes, DOB [birth year], MRN [id number]  PCP:  Redmond School, MD   [facility] Providers Cardiologist:  Freada Bergeron, MD {  Referring MD: Redmond School, MD    History of Present Illness:    Cindy Holmes is a 71 y.o. female with a hx of CAD s/p BMS to LAD and balloon angioplasty to D1 in 2001 and 2002, HFpEF, RA and lupus on chronic immunosuppressants, HTN with prior CVA, and Afib who presents to clinic for follow-up.  Per review of the record, the patient has a history of CAD with BMS to the LAD and balloon angioplasty of D1 in 2001>>s/p balloon angioplasty of D1 in 2002>> in-stent restenosis of the proximal LAD however FFR negative in 03/2016. She underwent a Myoview stress test in 2020 which was low risk with no ischemia.  Echocardiogram in 2018 with hyperdynamic LV function with grade 2 DD.   Was seen in the hospital 10/26/2020 with several day history of nausea, vomiting, diarrhea and poor oral intake with a 16 pound weight loss.  On ED presentation, she was noted to be in atrial fibrillation with RVR.  Abdominal CT was suspicious for diverticulitis therefore antibiotics were initiated. Given new onset AF, she was started on Eliquis 5 mg twice daily for CHA2DS2-VASc score of 6.  She spontaneously converted to NSR. Follow-up monitor showed episodes of SVT but no Afib.  Was last seen in clinic on 03/23/2021 with Cindy Holmes where she was continuing to have palpitations.  Past Medical History:  Diagnosis Date   CAD (coronary artery disease)    a. PTCA LAD 2001/cath 2002-prox and mid LAD stents patent, PTCA ostium Dx 1 b. cath 03/2016: patent LAD stent with less than 40% in-stent restenosis, 10-30% stenosis of D1 and distal LAD with continued medical management recommended. 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    Past Surgical History:  Procedure Laterality Date   CARDIAC CATHETERIZATION     with stent placement in 2001   CARDIAC CATHETERIZATION N/A 03/12/2016   Procedure: Left Heart Cath and Coronary Angiography;  Surgeon: Nelva Bush, MD;  Location: [place] [facility];  Service: Cardiovascular;  Laterality: N/A;   CARDIAC CATHETERIZATION N/A 03/12/2016   Procedure: Intravascular Pressure Wire/FFR Study;  Surgeon: Nelva Bush, MD;  Location: [place] [facility];  Service: Cardiovascular;  Laterality: N/A;   CHOLECYSTECTOMY     CORONARY ANGIOPLASTY     2002   ESOPHAGOGASTRODUODENOSCOPY N/A 12/03/2016   Procedure: ESOPHAGOGASTRODUODENOSCOPY (EGD);  Surgeon: Carol Ada, MD;  Location: [place];  Service: Endoscopy;  Laterality: N/A;   laproscopic adjustable gastric  banding  with APS standard system.    OPEN REDUCTION INTERNAL FIXATION (ORIF) DISTAL RADIAL FRACTURE Right 05/15/2018   Procedure: OPEN REDUCTION INTERNAL FIXATION (ORIF) DISTAL RADIAL FRACTURE;  Surgeon: Shona Needles, MD;  Location: [place];  Service: Orthopedics;  Laterality: Right;   ORIF ANKLE FRACTURE Right 05/15/2018   Procedure: OPEN REDUCTION INTERNAL FIXATION (ORIF) ANKLE FRACTURE;  Surgeon: Shona Needles, MD;  Location: [place];  Service: Orthopedics;  Laterality: Right;   ORIF TIBIA PLATEAU Right 05/15/2018   Procedure: OPEN REDUCTION INTERNAL FIXATION (ORIF)  TIBIAL PLATEAU;  Surgeon: Shona Needles, MD;  Location: [place];  Service: Orthopedics;  Laterality: Right;   Pt has 3 stents     sept 2001   SHOULDER ARTHROSCOPY WITH SUBACROMIAL DECOMPRESSION, ROTATOR CUFF REPAIR AND BICEP TENDON REPAIR Right 11/16/2013   Procedure: RIGHT SHOULDER ARTHROSCOPY, EXTENSIVE DEBRIDEMENT, ROTATOR CUFF REPAIR ;  Surgeon: [name], MD;  Location: [place];  Service: Orthopedics;  Laterality: Right;   TUBAL LIGATION      Current Medications: No outpatient medications have been marked as taking for the 08/03/21 encounter (Appointment) with Freada Bergeron, MD.     Allergies:   Codeine phosphate, Morphine and related, Amoxicillin, Penicillins, and Lidocaine   Social History   Socioeconomic History   Marital status: Married    Spouse name: Not on file   Number of children: Not on file   Years of education: Not on file   Highest education level: Not on file  Occupational History   Not on file  Tobacco Use   Smoking status: Never   Smokeless tobacco: Never  Vaping Use   Vaping Use: Never used  Substance and Sexual Activity   Alcohol use: No    Alcohol/week: 0.0 standard drinks   Drug use: No   Sexual activity: Never  Other Topics Concern   Not on file  Social History Narrative   Not on file   Social Determinants of Health   Financial Resource Strain: Not on file  Food Insecurity: Not on file  Transportation Needs: Not on file  Physical Activity: Not on file  Stress: Not on file  Social Connections: Not on file     Family History: The patient's ***family history includes Arthritis/Rheumatoid in her father; Cancer in an other family member; Cirrhosis in her father; Colon cancer in her maternal grandmother; Diabetes in her brother and sister; Heart attack in her maternal aunt, maternal uncle, mother, and another family member; Heart disease in her brother, brother, maternal aunt, and mother; Throat cancer in her maternal  aunt and maternal grandfather.  ROS:   Please see the history of present illness.    *** All other systems reviewed and are negative.  EKGs/Labs/Other Studies Reviewed:    The following studies were reviewed today: 03/30/21 Myoview:   The study is normal. The study is low risk.   No ST deviation was noted.   Left ventricular function is normal. Nuclear stress EF: 78 %. The left ventricular ejection fraction is hyperdynamic (>65%).   Prior study available for comparison from 05/22/2019. No changes compared to prior study.   No evidence of ischemia or previous myocardial infarction.   Bilateral ICA 1-39    LONG TERM MONITOR (8-14 DAYS) INTERPRETATION 01/02/2021 Narrative  Patch wear time was 12 days and 11 hours  Predominant rhythm was NSR with average HR 70bpm (ranging from 50-174bpm)  There were 368 episodes of SVT with the longest lasting 16.2 seconds at average  rate 120bpm; fastest was 13 beats at max rate of 174bpm  Occasional SVE (2.3%), rare PVCs (<1%)  No AFib, VT or significant pauses   Echocardiogram 10/27/20 EF 60-65, no RWMA, mild LVH, GRII DD, normal RVSF, RVSP 31.1, mild LAE, mild MR, trivial AI   Carotid US 05/22/2019 Bilateral ICA 1-39   Myoview 05/22/2019 EF 86, no ischemia or infarction, low risk   Renal artery [place] 01/27/2017 Normal renal arteries, bilaterally Normal caliber abdominal aorta   Echocardiogram 12/03/2016 EF 65-70, normal wall motion, GR 2 DD, mild LAE   Cardiac catheterization 03/12/2016 LM normal LAD proximal stent patent w 40 ISR (FFR 0.84), dist stent patent w 10 ISR; D1 ost 30 LCx patent  RCA patent  EF 55-65  EKG:  EKG is *** ordered today.  The ekg ordered today demonstrates ***  Recent Labs: 10/26/2020: ALT 59; TSH 1.234 10/27/2020: Magnesium 1.5 10/28/2020: BUN 7; Creatinine, Ser 0.60; Hemoglobin 10.1; Platelets 304; Potassium 3.8; Sodium 135  Recent Lipid Panel    Component Value Date/Time   CHOL 158 02/12/2019 0846    TRIG 103 02/12/2019 0846   HDL 93 02/12/2019 0846   CHOLHDL 1.7 02/12/2019 0846   CHOLHDL 2.3 03/12/2016 0252   VLDL 30 03/12/2016 0252   LDLCALC 44 02/12/2019 0846     Risk Assessment/Calculations:   {Does this patient have ATRIAL FIBRILLATION?[phone]}       Physical Exam:    VS:  There were no vitals taken for this visit.    Wt Readings from Last 3 Encounters:  03/30/21 232 lb (105.2 kg)  03/23/21 232 lb (105.2 kg)  12/09/20 227 lb 12.8 oz (103.3 kg)     GEN: *** Well nourished, well developed in no acute distress HEENT: Normal NECK: No JVD; No carotid bruits LYMPHATICS: No lymphadenopathy CARDIAC: ***RRR, no murmurs, rubs, gallops RESPIRATORY:  Clear to auscultation without rales, wheezing or rhonchi  ABDOMEN: Soft, non-tender, non-distended MUSCULOSKELETAL:  No edema; No deformity  SKIN: Warm and dry NEUROLOGIC:  Alert and oriented x 3 PSYCHIATRIC:  Normal affect   ASSESSMENT:    No diagnosis found. PLAN:    In order of problems listed above:  #CAD with angina: Has history of prior BMS to the LAD and angioplasty to the D1 in 2001 and balloon angioplasty to the D1 in 2002.  She had moderate in-stent restenosis of the proximal LAD with negative FFR by cardiac catheterization in 2017.  She had a low risk Myoview in 2020 and 03/2021.  -Continue metop 25mg  BID -Continue rampiril 2.5mg  daily -Continue lipitor 40mg  daily -Continue ASA 81mg  daily -Continue zetia 10mg  daily  #pAfib: CHADs-vasc 6. No episodes of Afib on recent monitor. -Continue apixaban 5mg  BID -Continue metop 25mg  BID  #Chronic HFpEF: Currently appears euvolemic. -Continue lasix 40mg  daily -Continue metop 25mg  BID -Continue rampiril 2.5mg  daily  #HTN: -Continue metop 25mg  BID -Continue rampiril 2.5mg  daily  #HLD: -Continue zetia 10mg  daily -Continue lipitor 40mg  daily  #Bilateral CAS: -Continue ASA 81mg  daily -Continue zetia 10mg  daily -Continue lipitor 40mg  daily      {Are  you ordering a CV Procedure (e.g. stress test, cath, DCCV, TEE, etc)?   Press F2        :[id number]}    Medication Adjustments/Labs and Tests Ordered: Current medicines are reviewed at length with the patient today.  Concerns regarding medicines are outlined above.  No orders of the defined types were placed in this encounter.  No orders of the defined types were placed in this  encounter.   There are no Patient Instructions on file for this visit.   Signed, Freada Bergeron, MD  07/27/2021 1:48 PM    [place]

## 2021-08-03 ENCOUNTER — Ambulatory Visit: Payer: Medicare Other | Admitting: Cardiology

## 2021-08-07 DIAGNOSIS — G4700 Insomnia, unspecified: Secondary | ICD-10-CM | POA: Diagnosis not present

## 2021-08-07 DIAGNOSIS — I4891 Unspecified atrial fibrillation: Secondary | ICD-10-CM | POA: Diagnosis not present

## 2021-08-07 DIAGNOSIS — I10 Essential (primary) hypertension: Secondary | ICD-10-CM | POA: Diagnosis not present

## 2021-08-07 DIAGNOSIS — Z6841 Body Mass Index (BMI) 40.0 and over, adult: Secondary | ICD-10-CM | POA: Diagnosis not present

## 2021-08-07 DIAGNOSIS — M069 Rheumatoid arthritis, unspecified: Secondary | ICD-10-CM | POA: Diagnosis not present

## 2021-08-07 DIAGNOSIS — Z1231 Encounter for screening mammogram for malignant neoplasm of breast: Secondary | ICD-10-CM | POA: Diagnosis not present

## 2021-08-07 DIAGNOSIS — G894 Chronic pain syndrome: Secondary | ICD-10-CM | POA: Diagnosis not present

## 2021-09-11 DIAGNOSIS — Z20822 Contact with and (suspected) exposure to covid-19: Secondary | ICD-10-CM | POA: Diagnosis not present

## 2021-09-15 DIAGNOSIS — M1712 Unilateral primary osteoarthritis, left knee: Secondary | ICD-10-CM | POA: Diagnosis not present

## 2021-09-15 DIAGNOSIS — S82851D Displaced trimalleolar fracture of right lower leg, subsequent encounter for closed fracture with routine healing: Secondary | ICD-10-CM | POA: Diagnosis not present

## 2021-09-15 DIAGNOSIS — S82141D Displaced bicondylar fracture of right tibia, subsequent encounter for closed fracture with routine healing: Secondary | ICD-10-CM | POA: Diagnosis not present

## 2021-10-13 DIAGNOSIS — Z6841 Body Mass Index (BMI) 40.0 and over, adult: Secondary | ICD-10-CM | POA: Diagnosis not present

## 2021-10-13 DIAGNOSIS — M1712 Unilateral primary osteoarthritis, left knee: Secondary | ICD-10-CM | POA: Diagnosis not present

## 2021-10-13 DIAGNOSIS — M7052 Other bursitis of knee, left knee: Secondary | ICD-10-CM | POA: Diagnosis not present

## 2021-10-17 NOTE — Progress Notes (Deleted)
?Cardiology Office Note:   ? ?Date:  10/17/2021  ? ?ID:  [name], DOB 03-30-1951, MRN [id number] ? ?PCP:  Redmond School, MD ?  ?[place] [facility] Providers ?Cardiologist:  Freada Bergeron, MD { ? ? ?Referring MD: Redmond School, MD  ? ? ? ?History of Present Illness:   ? ?MARSHEA Cindy Holmes is a 71 y.o. female with a hx of CAD s/p BMS to LAD and balloon angioplasty of D1 in 2001 and repeat balloon angioplasty to D1 in 0174, diastolic HF, RA, lupus on chronic immunosuppression, HTN, prior CVA and Afib who was previously followed by Dr. Meda Coffee who now presents to clinic for follow-up. ? ?Per review of the record, the patient has a history of known CAD with BMS to the LAD and balloon angioplasty of D1 in 2001>>s/p balloon angioplasty of D1 in 2002>> in-stent restenosis of the proximal LAD however FFR negative in 03/2016. Underwent a Myoview stress test in 2020 which was low risk with no ischemia.  Echocardiogram in 2018 with hyperdynamic LV function with grade 2 DD.   ? ?Had admission in 10/2020 where she presented with several day history of nausea, vomiting, diarrhea and poor oral intake with a 16 pound weight loss.  On ED presentation, she was noted to be in atrial fibrillation with RVR.  Abdominal CT was suspicious for diverticulitis therefore antibiotics were initiated. Given new onset AF, she was started on Eliquis 5 mg twice daily for CHA2DS2-VASc score of 6.  If bleeding risk was felt to be low, plan was to consider continuing her ASA 81 mg given her history of POBA and BMS. Had follow-up cardiac monitor that showed several episodes of SVT but no Afib. ? ?Last saw [name] in 03/2021 where she was having palpitations but were less frequent than previously. Also was having atypical chest pain. Myoview 03/2021 was normal and low risk. ? ?Today, *** ? ?Past Medical History:  ?Diagnosis Date  ? CAD (coronary artery disease)   ? a. PTCA LAD 2001/cath 2002-prox and mid LAD stents patent, PTCA ostium Dx 1 b.  cath 03/2016: patent LAD stent with less than 40% in-stent restenosis, 10-30% stenosis of D1 and distal LAD with continued medical management recommended. // Myoview 9/22: No ischemia or infarction, EF 78; low risk  ? Candida esophagitis ([place])   ? Carotid artery disease ([place])   ? Doppler September, 2011, 0-39% bilateral disease mild  ? Chronic diastolic CHF (congestive heart failure) ([place])   ? Edema   ? Emotional stress reaction   ? 8/11  ? HTN (hypertension)   ? Hyperlipidemia   ? Leg pain   ? July, 2012, Arterial Dopplers March 08, 2011 normal  ? Neuromuscular disorder [facility])   ? 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 Overweight(278.02)   ? laproscopic adjustable gastric banding with APS standard system  ? PONV (postoperative nausea and vomiting)   ? Rheumatoid arthritis ([place])   ? Right rotator cuff tear 11/16/2013  ? Stroke [facility])   ? mini stroke in past ???  ? ? ?Past Surgical History:  ?Procedure Laterality Date  ? CARDIAC CATHETERIZATION    ? with stent placement in 2001  ? CARDIAC CATHETERIZATION N/A 03/12/2016  ? Procedure: Left Heart Cath and Coronary Angiography;  Surgeon: Nelva Bush, MD;  Location: [place] [facility];  Service: Cardiovascular;  Laterality: N/A;  ? CARDIAC CATHETERIZATION N/A 03/12/2016  ? Procedure: Intravascular Pressure Wire/FFR Study;  Surgeon: Nelva Bush, MD;  Location: [place] [facility];  Service: Cardiovascular;  Laterality: N/A;  ? CHOLECYSTECTOMY    ? CORONARY ANGIOPLASTY    ? 2002  ? ESOPHAGOGASTRODUODENOSCOPY N/A 12/03/2016  ? Procedure: ESOPHAGOGASTRODUODENOSCOPY (EGD);  Surgeon: Carol Ada, MD;  Location: [place];  Service: Endoscopy;  Laterality: N/A;  ? laproscopic adjustable gastric  banding    ? with APS standard system.   ? OPEN REDUCTION INTERNAL FIXATION (ORIF) DISTAL RADIAL FRACTURE Right 05/15/2018  ? Procedure: OPEN REDUCTION INTERNAL FIXATION (ORIF) DISTAL RADIAL FRACTURE;  Surgeon: Shona Needles, MD;  Location: [place];  Service: Orthopedics;  Laterality:  Right;  ? ORIF ANKLE FRACTURE Right 05/15/2018  ? Procedure: OPEN REDUCTION INTERNAL FIXATION (ORIF) ANKLE FRACTURE;  Surgeon: Shona Needles, MD;  Location: [place];  Service: Orthopedics;  Laterality: Right;  ? ORIF TIBIA PLATEAU Right 05/15/2018  ? Procedure: OPEN REDUCTION INTERNAL FIXATION (ORIF) TIBIAL PLATEAU;  Surgeon: Shona Needles, MD;  Location: [place];  Service: Orthopedics;  Laterality: Right;  ? Pt has 3 stents    ? sept 2001  ? SHOULDER ARTHROSCOPY WITH SUBACROMIAL DECOMPRESSION, ROTATOR CUFF REPAIR AND BICEP TENDON REPAIR Right 11/16/2013  ? Procedure: RIGHT SHOULDER ARTHROSCOPY, EXTENSIVE DEBRIDEMENT, ROTATOR CUFF REPAIR ;  Surgeon: [name], MD;  Location: [place];  Service: Orthopedics;  Laterality: Right;  ? TUBAL LIGATION    ? ? ?Current Medications: ?No outpatient medications have been marked as taking for the 10/19/21 encounter (Appointment) with Freada Bergeron, MD.  ?  ? ?Allergies:   Codeine phosphate, Morphine and related, Amoxicillin, Penicillins, and Lidocaine  ? ?Social History  ? ?Socioeconomic History  ? Marital status: Married  ?  Spouse name: Not on file  ? Number of children: Not on file  ? Years of education: Not on file  ? Highest education level: Not on file  ?Occupational History  ? Not on file  ?Tobacco Use  ? Smoking status: Never  ? Smokeless tobacco: Never  ?Vaping Use  ? Vaping Use: Never used  ?Substance and Sexual Activity  ? Alcohol use: No  ?  Alcohol/week: 0.0 standard drinks  ? Drug use: No  ? Sexual activity: Never  ?Other Topics Concern  ? Not on file  ?Social History Narrative  ? Not on file  ? ?Social Determinants of Health  ? ?Financial Resource Strain: Not on file  ?Food Insecurity: Not on file  ?Transportation Needs: Not on file  ?Physical Activity: Not on file  ?Stress: Not on file  ?Social Connections: Not on file  ?  ? ?Family History: ?The patient's ***family history includes Arthritis/Rheumatoid in her father; Cancer in an other  family member; Cirrhosis in her father; Colon cancer in her maternal grandmother; Diabetes in her brother and sister; Heart attack in her maternal aunt, maternal uncle, mother, and another family member; Heart disease in her brother, brother, maternal aunt, and mother; Throat cancer in her maternal aunt and maternal grandfather. ? ?ROS:   ?Please see the history of present illness.    ?*** All other systems reviewed and are negative. ? ?EKGs/Labs/Other Studies Reviewed:   ? ?The following studies were reviewed today: ?Myoview 03/2021: ?The study is normal. The study is low risk. ?  No ST deviation was noted. ?  Left ventricular function is normal. Nuclear stress EF: 78 %. The left ventricular ejection fraction is hyperdynamic (>65%). ?  Prior study available for comparison from 05/22/2019. No changes compared to prior study. ?  ?No evidence of ischemia or previous myocardial infarction. ?  Carotid US 01/19/21 ?Bilateral ICA 1-39  ?  ?LONG TERM MONITOR (8-14 DAYS) INTERPRETATION 01/02/2021 ?Narrative ?? Patch wear time was 12 days and 11 hours ?? Predominant rhythm was NSR with average HR 70bpm (ranging from 50-174bpm) ?? There were 368 episodes of SVT with the longest lasting 16.2 seconds at average rate 120bpm; fastest was 13 beats at max rate of 174bpm ?? Occasional SVE (2.3%), rare PVCs (<1%) ?? No AFib, VT or significant pauses ?  ?Echocardiogram 10/27/20 ?EF 60-65, no RWMA, mild LVH, GRII DD, normal RVSF, RVSP 31.1, mild LAE, mild MR, trivial AI ?  ?Carotid US 05/22/2019 ?Bilateral ICA 1-39 ?  ?Myoview 05/22/2019 ?EF 86, no ischemia or infarction, low risk ?  ?Renal artery [place] 01/27/2017 ?Normal renal arteries, bilaterally ?Normal caliber abdominal aorta ?  ?Echocardiogram 12/03/2016 ?EF 65-70, normal wall motion, GR 2 DD, mild LAE ?  ?Cardiac catheterization 03/12/2016 ?LM normal ?LAD proximal stent patent w 40 ISR (FFR 0.84), dist stent patent w 10 ISR; D1 ost 30 ?LCx patent  ?RCA patent  ?EF 55-65 ? ?EKG:  EKG is ***  ordered today.  The ekg ordered today demonstrates *** ? ?Recent Labs: ?10/26/2020: ALT 59; TSH 1.234 ?10/27/2020: Magnesium 1.5 ?10/28/2020: BUN 7; Creatinine, Ser 0.60; Hemoglobin 10.1; Platelets 304; Po

## 2021-10-19 ENCOUNTER — Ambulatory Visit (INDEPENDENT_AMBULATORY_CARE_PROVIDER_SITE_OTHER): Payer: Medicare Other | Admitting: Cardiology

## 2021-10-19 ENCOUNTER — Encounter: Payer: Self-pay | Admitting: Cardiology

## 2021-10-19 VITALS — BP 124/80 | HR 64 | Ht 63.0 in | Wt 232.8 lb

## 2021-10-19 DIAGNOSIS — I480 Paroxysmal atrial fibrillation: Secondary | ICD-10-CM

## 2021-10-19 DIAGNOSIS — I4891 Unspecified atrial fibrillation: Secondary | ICD-10-CM | POA: Diagnosis not present

## 2021-10-19 DIAGNOSIS — I6523 Occlusion and stenosis of bilateral carotid arteries: Secondary | ICD-10-CM

## 2021-10-19 DIAGNOSIS — R590 Localized enlarged lymph nodes: Secondary | ICD-10-CM

## 2021-10-19 DIAGNOSIS — R599 Enlarged lymph nodes, unspecified: Secondary | ICD-10-CM | POA: Diagnosis not present

## 2021-10-19 DIAGNOSIS — I5032 Chronic diastolic (congestive) heart failure: Secondary | ICD-10-CM | POA: Diagnosis not present

## 2021-10-19 DIAGNOSIS — R002 Palpitations: Secondary | ICD-10-CM | POA: Diagnosis not present

## 2021-10-19 DIAGNOSIS — M059 Rheumatoid arthritis with rheumatoid factor, unspecified: Secondary | ICD-10-CM | POA: Diagnosis not present

## 2021-10-19 DIAGNOSIS — I10 Essential (primary) hypertension: Secondary | ICD-10-CM

## 2021-10-19 DIAGNOSIS — E782 Mixed hyperlipidemia: Secondary | ICD-10-CM

## 2021-10-19 DIAGNOSIS — I2510 Atherosclerotic heart disease of native coronary artery without angina pectoris: Secondary | ICD-10-CM | POA: Diagnosis not present

## 2021-10-19 NOTE — Patient Instructions (Addendum)
Medication Instructions:  ?Your physician recommends that you continue on your current medications as directed. Please refer to the Current Medication list given to you today. ? ?*If you need a refill on your cardiac medications before your next appointment, please call your pharmacy* ? ? ?Lab Work: ?Bmp, Pro bnp, Cbc, lipids- today  ? ?If you have labs (blood work) drawn today and your tests are completely normal, you will receive your results only by: ?MyChart Message (if you have MyChart) OR ?A paper copy in the mail ?If you have any lab test that is abnormal or we need to change your treatment, we will call you to review the results. ? ? ?Testing/Procedures: ?Your physician has ordered for you to have a soft tissue ultrasound of your neck to look at your lymph nodes.  ? ?   1. You may have this done at the [facility], located in the [place] on the 1st floor. ?   2. You do no have to have an appointment. ?   3. [place] ?       [place], [place] [address] ?       [phone] ?       Monday - Friday  8:00 am - 5:00 pm ?  ? ? ?Follow-Up: ?At [facility], you and your health needs are our priority.  As part of our continuing mission to provide you with exceptional heart care, we have created designated Provider Care Teams.  These Care Teams include your primary Cardiologist (physician) and Advanced Practice Providers (APPs -  Physician Assistants and Nurse Practitioners) who all work together to provide you with the care you need, when you need it. ? ?We recommend signing up for the patient portal called "MyChart".  Sign up information is provided on this After Visit Summary.  MyChart is used to connect with patients for Virtual Visits (Telemedicine).  Patients are able to view lab/test results, encounter notes, upcoming appointments, etc.  Non-urgent messages can be sent to your provider as well.   ?To learn more about what you can do with MyChart, go to  [email or web address].   ? ?Your next appointment:   ?3 month(s) ? ?The format for your next appointment:   ?In Person ? ?Provider:   ?Freada Bergeron, MD   ? ? ?Other Instructions ? ?Important Information About Sugar ? ? ? ? ?  ?

## 2021-10-19 NOTE — Progress Notes (Signed)
?Cardiology Office Note:   ? ?Date:  10/19/2021  ? ?ID:  Cindy Holmes, DOB August 11, 1950, MRN [id number] ? ?PCP:  Cindy School, MD ?  ?[facility] Providers ?Cardiologist:  Freada Bergeron, MD { ? ? ?Referring MD: Cindy School, MD  ? ? ?History of Present Illness:   ? ?Cindy Holmes is a 71 y.o. female with a hx of CAD s/p BMS to LAD and balloon angioplasty of D1 in 2001 and repeat balloon angioplasty to D1 in 4010, diastolic HF, RA, lupus on chronic immunosuppression, HTN, prior CVA and Afib who was previously followed by Dr. Meda Coffee who now presents to clinic for follow-up. ? ?Per review of the record, the patient has a history of known CAD with BMS to the LAD and balloon angioplasty of D1 in 2001>>s/p balloon angioplasty of D1 in 2002>> in-stent restenosis of the proximal LAD however FFR negative in 03/2016. Underwent a Myoview stress test in 2020 which was low risk with no ischemia.  Echocardiogram in 2018 with hyperdynamic LV function with grade 2 DD.   ? ?Had admission in 10/2020 where she presented with several day history of nausea, vomiting, diarrhea and poor oral intake with a 16 pound weight loss. On ED presentation, she was noted to be in atrial fibrillation with RVR.  Abdominal CT was suspicious for diverticulitis therefore antibiotics were initiated. Given new onset AF, she was started on Eliquis 5 mg twice daily for CHA2DS2-VASc score of 6.  If bleeding risk was felt to be low, plan was to consider continuing her ASA 81 mg given her history of POBA and BMS. Had follow-up cardiac monitor that showed several episodes of SVT but no Afib. ? ?Last saw Cindy Holmes in 03/2021 where she was having palpitations but were less frequent than previously. Also was having atypical chest pain. Myoview 03/2021 was normal and low risk. ? ?Today, she is accompanied by her husband and not doing the best. She had a fall last year and injured her right side. She continues to have pain along her R leg.  Recently, she developed severe hamstring tendonitis and rheumatoid arthritis in her L leg. She is not a candidate for knee replacement but will receive silicone gel injections in her L-knee on the 25th.  ? ?Her bilateral legs have been constantly swollen, especially around her knees. She is wearing compression socks to alleviate the swelling. She endorses the swelling is a chronic issue and takes Lasix daily as well as additional doses up to 2-3 times per week. Symptoms are worse in the heat.  ? ?The patient also states that last night, she was sleeping in her recliner when she was woken up by her heart racing. She had similar heart racing episodes in the past that resolved after time. This recent episode lasted longer than previously. She denies chest pain, chest pressure, PND, or orthopnea. Symptoms eventually abated on their own without intervention. ? ?She is compliant in taking her medications. Currently, she takes 50 mg metoprolol in the morning and 25 mg again in the evening. She is also taking daily OTC allergy medication. For pain, she takes Tylenol. She usually drinks coffee, tea, or Mountain Dew throughout the day instead of water.  ? ?Also reports concern about a swollen L lymph node that was incidentally found on carotid ultasound. She is concerned this may be contributing to intermittent neck pain and ear fullness.  ? ?Past Medical History:  ?Diagnosis Date  ? CAD (coronary artery disease)   ?  a. PTCA LAD 2001/cath 2002-prox and mid LAD stents patent, PTCA ostium Dx 1 b. cath 03/2016: patent LAD stent with less than 40% in-stent restenosis, 10-30% stenosis of D1 and distal LAD with continued medical management recommended. // Myoview 9/22: No ischemia or infarction, EF 78; low risk  ? Candida esophagitis ([place])   ? Carotid artery disease ([place])   ? Doppler September, 2011, 0-39% bilateral disease mild  ? Chronic diastolic CHF (congestive heart failure) ([place])   ? Edema   ? Emotional stress reaction   ?  8/11  ? HTN (hypertension)   ? Hyperlipidemia   ? Leg pain   ? July, 2012, Arterial Dopplers March 08, 2011 normal  ? Neuromuscular disorder [facility])   ? 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 Overweight(278.02)   ? laproscopic adjustable gastric banding with APS standard system  ? PONV (postoperative nausea and vomiting)   ? Rheumatoid arthritis ([place])   ? Right rotator cuff tear 11/16/2013  ? Stroke [facility])   ? mini stroke in past ???  ? ? ?Past Surgical History:  ?Procedure Laterality Date  ? CARDIAC CATHETERIZATION    ? with stent placement in 2001  ? CARDIAC CATHETERIZATION N/A 03/12/2016  ? Procedure: Left Heart Cath and Coronary Angiography;  Surgeon: Nelva Bush, MD;  Location: [place] [facility];  Service: Cardiovascular;  Laterality: N/A;  ? CARDIAC CATHETERIZATION N/A 03/12/2016  ? Procedure: Intravascular Pressure Wire/FFR Study;  Surgeon: Nelva Bush, MD;  Location: [place] [facility];  Service: Cardiovascular;  Laterality: N/A;  ? CHOLECYSTECTOMY    ? CORONARY ANGIOPLASTY    ? 2002  ? ESOPHAGOGASTRODUODENOSCOPY N/A 12/03/2016  ? Procedure: ESOPHAGOGASTRODUODENOSCOPY (EGD);  Surgeon: Carol Ada, MD;  Location: [place];  Service: Endoscopy;  Laterality: N/A;  ? laproscopic adjustable gastric  banding    ? with APS standard system.   ? OPEN REDUCTION INTERNAL FIXATION (ORIF) DISTAL RADIAL FRACTURE Right 05/15/2018  ? Procedure: OPEN REDUCTION INTERNAL FIXATION (ORIF) DISTAL RADIAL FRACTURE;  Surgeon: Shona Needles, MD;  Location: [place];  Service: Orthopedics;  Laterality: Right;  ? ORIF ANKLE FRACTURE Right 05/15/2018  ? Procedure: OPEN REDUCTION INTERNAL FIXATION (ORIF) ANKLE FRACTURE;  Surgeon: Shona Needles, MD;  Location: [place];  Service: Orthopedics;  Laterality: Right;  ? ORIF TIBIA PLATEAU Right 05/15/2018  ? Procedure: OPEN REDUCTION INTERNAL FIXATION (ORIF) TIBIAL PLATEAU;  Surgeon: Shona Needles, MD;  Location: [place];  Service: Orthopedics;  Laterality: Right;  ? Pt has 3 stents    ? sept  2001  ? SHOULDER ARTHROSCOPY WITH SUBACROMIAL DECOMPRESSION, ROTATOR CUFF REPAIR AND BICEP TENDON REPAIR Right 11/16/2013  ? Procedure: RIGHT SHOULDER ARTHROSCOPY, EXTENSIVE DEBRIDEMENT, ROTATOR CUFF REPAIR ;  Surgeon: [name], MD;  Location: [place];  Service: Orthopedics;  Laterality: Right;  ? TUBAL LIGATION    ? ? ?Current Medications: ?Current Meds  ?Medication Sig  ? acetaminophen (TYLENOL) 500 MG tablet Take 1,000 mg by mouth every 6 (six) hours as needed for headache (pain).  ? allopurinol (ZYLOPRIM) 100 MG tablet Take 400 mg by mouth at bedtime.  ? apixaban (ELIQUIS) 5 MG TABS tablet Take 1 tablet (5 mg total) by mouth 2 (two) times daily.  ? aspirin EC 81 MG tablet Take 81 mg by mouth at bedtime. Swallow whole.  ? atorvastatin (LIPITOR) 40 MG tablet Take 2 tablets (80 mg total) by mouth daily  ? bisacodyl (DULCOLAX) 5 MG EC tablet Take 5 mg by mouth at  bedtime as needed for moderate constipation.  ? ezetimibe (ZETIA) 10 MG tablet Take 1 tablet (10 mg total) by mouth daily.  ? folic acid (FOLVITE) 1 MG tablet Take 1 mg by mouth at bedtime.  ? furosemide (LASIX) 40 MG tablet TAKE 1 TABLET BY MOUTH  DAILY AND MAY TAKE 1  ADDITIONAL TABLET DAILY AS  NEEDED FOR SWELLING  ? hydroxychloroquine (PLAQUENIL) 200 MG tablet Take 200 mg by mouth at bedtime.  ? levocetirizine (XYZAL) 5 MG tablet Take 5 mg by mouth at bedtime.  ? Melatonin 3 MG TABS Take 3 mg by mouth at bedtime as needed (sleep).  ? methotrexate (RHEUMATREX) 2.5 MG tablet Take 10 mg by mouth every Sunday.  ? metoprolol tartrate (LOPRESSOR) 50 MG tablet Take 1 tablet (50 mg total) by mouth 2 (two) times daily. To be taken with one tablet by mouth ( 25 mg ) 2 (two) times daily = ( 75 mg) total.  ? nitroGLYCERIN (NITROSTAT) 0.4 MG SL tablet DISSOLVE 1 TABLET UNDER THE TONGUE EVERY 5 MINUTES AS  NEEDED FOR CHEST PAIN. MAX  OF 3 TABLETS IN 15 MINUTES. CALL 911 IF PAIN PERSISTS.  ? Omega-3 Fatty Acids (FISH OIL) 1000 MG CAPS Take 1,000  mg by mouth at bedtime.  ? Potassium Chloride ER 20 MEQ TBCR Take 10 mEq by mouth daily.  ? predniSONE (DELTASONE) 5 MG tablet Take 5 mg by mouth every morning.  ? ramipril (ALTACE) 2.5 MG capsule Take 1 capsule

## 2021-10-20 ENCOUNTER — Telehealth: Payer: Self-pay | Admitting: Cardiology

## 2021-10-20 ENCOUNTER — Other Ambulatory Visit: Payer: Self-pay | Admitting: Internal Medicine

## 2021-10-20 DIAGNOSIS — Z1231 Encounter for screening mammogram for malignant neoplasm of breast: Secondary | ICD-10-CM

## 2021-10-20 DIAGNOSIS — Z20822 Contact with and (suspected) exposure to covid-19: Secondary | ICD-10-CM | POA: Diagnosis not present

## 2021-10-20 LAB — LIPID PANEL
Chol/HDL Ratio: 2 ratio (ref 0.0–4.4)
Cholesterol, Total: 155 mg/dL (ref 100–199)
HDL: 79 mg/dL (ref >39)
LDL Chol Calc (NIH): 55 mg/dL (ref 0–99)
Triglycerides: 122 mg/dL (ref 0–149)
VLDL Cholesterol Cal: 21 mg/dL (ref 5–40)

## 2021-10-20 LAB — BASIC METABOLIC PANEL
BUN/Creatinine Ratio: 17 (ref 12–28)
BUN: 14 mg/dL (ref 8–27)
CO2: 27 mmol/L (ref 20–29)
Calcium: 9 mg/dL (ref 8.7–10.3)
Chloride: 95 mmol/L — ABNORMAL LOW (ref 96–106)
Creatinine, Ser: 0.83 mg/dL (ref 0.57–1.00)
Glucose: 119 mg/dL — ABNORMAL HIGH (ref 70–99)
Potassium: 4.3 mmol/L (ref 3.5–5.2)
Sodium: 135 mmol/L (ref 134–144)
eGFR: 76 mL/min/{1.73_m2} (ref >59)

## 2021-10-20 LAB — CBC
Hematocrit: 33.3 % — ABNORMAL LOW (ref 34.0–46.6)
Hemoglobin: 11.7 g/dL (ref 11.1–15.9)
MCH: 30.5 pg (ref 26.6–33.0)
MCHC: 35.1 g/dL (ref 31.5–35.7)
MCV: 87 fL (ref 79–97)
Platelets: 281 10*3/uL (ref 150–450)
RBC: 3.83 x10E6/uL (ref 3.77–5.28)
RDW: 13.6 % (ref 11.7–15.4)
WBC: 11.9 10*3/uL — ABNORMAL HIGH (ref 3.4–10.8)

## 2021-10-20 LAB — PRO B NATRIURETIC PEPTIDE: NT-Pro BNP: 614 pg/mL — ABNORMAL HIGH (ref 0–301)

## 2021-10-20 NOTE — Telephone Encounter (Signed)
? ?  Pt said, she received a call from [place] 2x today. Advised that it was [facility] calling her to schedule her appt. She said she is upset because yesterday after her appt with Dr. Johney Frame she was told to go to [facility] imaging and get the test no need an appt. When she got there she was told that she needs an appt, she also upset that there's a person there asking for money. She requesting to speak with a nurse, she doesn't want to go tehre anymore. She is asking if she can get her test order sent to [facility] imaging at [place], [place], [place] [address] only ?

## 2021-10-20 NOTE — Telephone Encounter (Signed)
Called pt in regards to [place].  Advised pt that an appointment is needed for [place].  Advised pt to call in to schedule appointment.  Pt expresses does not want to go to [place] d/t woman pan handling and sleeping in the lobby.  Pt upset that person was not asked to leave the building.  Advised pt when appointment is made to request only the [facility] location.  Pt had no further questions or concerns.  ?

## 2021-10-21 ENCOUNTER — Telehealth: Payer: Self-pay

## 2021-10-21 DIAGNOSIS — I10 Essential (primary) hypertension: Secondary | ICD-10-CM

## 2021-10-21 DIAGNOSIS — I5032 Chronic diastolic (congestive) heart failure: Secondary | ICD-10-CM

## 2021-10-21 MED ORDER — SPIRONOLACTONE 25 MG PO TABS: 12.5000 mg | ORAL_TABLET | Freq: Every day | ORAL | 3 refills | Status: DC

## 2021-10-21 NOTE — Telephone Encounter (Signed)
Pt aware of recommendations and will have repeat bmet done on 4/25/./cy ?

## 2021-10-21 NOTE — Telephone Encounter (Signed)
-----   Message from Freada Bergeron, MD sent at 10/20/2021  8:54 PM EDT ----- ?Her kidney function and electrolytes look good. Her potassium level is great. Her blood counts are stable and her cholesterol is well controlled. Her fluid levels are elevated. Can we increase her lasix to '40mg'$  BID every M, W, F and '40mg'$  daily on T, Th, Sat, Sunday as well as add spironolactone 12.'5mg'$  daily? Repeat BMET in 10-14 days for monitoring.  ?

## 2021-10-26 ENCOUNTER — Ambulatory Visit
Admission: RE | Admit: 2021-10-26 | Discharge: 2021-10-26 | Disposition: A | Discharge status: Home / Self Care | Payer: Medicare Other | Source: Ambulatory Visit | Attending: Cardiology | Admitting: Cardiology

## 2021-10-26 DIAGNOSIS — R590 Localized enlarged lymph nodes: Secondary | ICD-10-CM | POA: Diagnosis not present

## 2021-10-26 DIAGNOSIS — R599 Enlarged lymph nodes, unspecified: Secondary | ICD-10-CM

## 2021-10-28 ENCOUNTER — Telehealth: Payer: Self-pay

## 2021-10-28 DIAGNOSIS — R599 Enlarged lymph nodes, unspecified: Secondary | ICD-10-CM

## 2021-10-28 NOTE — Telephone Encounter (Signed)
-----   Message from Freada Bergeron, MD sent at 10/28/2021 11:11 AM EDT ----- ?The ultrasound of her neck shows normal lymph nodes but some small cystic structures that they cannot fully characterize on ultrasound alone. Can we get a soft tissue CT neck with contrast to assess further? Again, she should follow-up with her PCP to discuss further as well.  ?

## 2021-10-28 NOTE — Telephone Encounter (Signed)
The patient has been notified of the result and verbalized understanding.  All questions (if any) were answered. ?Antonieta Iba, RN 10/28/2021 12:25 PM  ?CT has been ordered.  ?

## 2021-11-02 ENCOUNTER — Telehealth: Payer: Self-pay | Admitting: Cardiology

## 2021-11-02 NOTE — Telephone Encounter (Signed)
Spoke with patient about her CT apt at [facility]. Her concern is that she is unable to walk long distances due to a knee injury to which she is in need of surgery. After speaking with her she agreed to keep her apt at [facility] for her CT. Karlene Einstein was able to get in touch with Ivin Booty who will contact her and give her more information about her appt. Pt verbalized understanding and thanked me for calling.  ?

## 2021-11-02 NOTE — Telephone Encounter (Signed)
Pt calling regarding upcoming CT appt, pt would like a callback. Please advise ?

## 2021-11-03 ENCOUNTER — Other Ambulatory Visit: Payer: Medicare Other | Admitting: *Deleted

## 2021-11-03 DIAGNOSIS — I10 Essential (primary) hypertension: Secondary | ICD-10-CM

## 2021-11-03 DIAGNOSIS — I5032 Chronic diastolic (congestive) heart failure: Secondary | ICD-10-CM | POA: Diagnosis not present

## 2021-11-03 DIAGNOSIS — M1712 Unilateral primary osteoarthritis, left knee: Secondary | ICD-10-CM | POA: Diagnosis not present

## 2021-11-03 LAB — BASIC METABOLIC PANEL
BUN/Creatinine Ratio: 17 (ref 12–28)
BUN: 14 mg/dL (ref 8–27)
CO2: 22 mmol/L (ref 20–29)
Calcium: 9.3 mg/dL (ref 8.7–10.3)
Chloride: 95 mmol/L — ABNORMAL LOW (ref 96–106)
Creatinine, Ser: 0.82 mg/dL (ref 0.57–1.00)
Glucose: 101 mg/dL — ABNORMAL HIGH (ref 70–99)
Potassium: 4.3 mmol/L (ref 3.5–5.2)
Sodium: 134 mmol/L (ref 134–144)
eGFR: 77 mL/min/{1.73_m2} (ref >59)

## 2021-11-06 ENCOUNTER — Ambulatory Visit (HOSPITAL_COMMUNITY)
Admission: RE | Admit: 2021-11-06 | Discharge: 2021-11-06 | Disposition: A | Discharge status: Home / Self Care | Payer: Medicare Other | Source: Ambulatory Visit | Attending: Cardiology | Admitting: Cardiology

## 2021-11-06 DIAGNOSIS — I700 Atherosclerosis of aorta: Secondary | ICD-10-CM | POA: Diagnosis not present

## 2021-11-06 DIAGNOSIS — R599 Enlarged lymph nodes, unspecified: Secondary | ICD-10-CM

## 2021-11-06 DIAGNOSIS — R590 Localized enlarged lymph nodes: Secondary | ICD-10-CM | POA: Diagnosis not present

## 2021-11-06 MED ORDER — SODIUM CHLORIDE (PF) 0.9 % IJ SOLN
INTRAMUSCULAR | Status: AC
  Filled 2021-11-06: qty 50

## 2021-11-06 MED ORDER — IOHEXOL 300 MG/ML  SOLN
80.0000 mL | Freq: Once | INTRAMUSCULAR | Status: AC | PRN
  Administered 2021-11-06: 80 mL via INTRAVENOUS

## 2021-11-10 DIAGNOSIS — M1712 Unilateral primary osteoarthritis, left knee: Secondary | ICD-10-CM | POA: Diagnosis not present

## 2021-11-23 ENCOUNTER — Ambulatory Visit
Admission: RE | Admit: 2021-11-23 | Discharge: 2021-11-23 | Disposition: A | Discharge status: Home / Self Care | Payer: Medicare Other | Source: Ambulatory Visit | Attending: Internal Medicine | Admitting: Internal Medicine

## 2021-11-23 DIAGNOSIS — Z1231 Encounter for screening mammogram for malignant neoplasm of breast: Secondary | ICD-10-CM

## 2021-11-25 ENCOUNTER — Other Ambulatory Visit: Payer: Self-pay | Admitting: Internal Medicine

## 2021-11-25 DIAGNOSIS — R928 Other abnormal and inconclusive findings on diagnostic imaging of breast: Secondary | ICD-10-CM

## 2021-12-02 ENCOUNTER — Ambulatory Visit
Admission: RE | Admit: 2021-12-02 | Discharge: 2021-12-02 | Disposition: A | Discharge status: Home / Self Care | Payer: Medicare Other | Source: Ambulatory Visit | Attending: Internal Medicine | Admitting: Internal Medicine

## 2021-12-02 DIAGNOSIS — N6311 Unspecified lump in the right breast, upper outer quadrant: Secondary | ICD-10-CM | POA: Diagnosis not present

## 2021-12-02 DIAGNOSIS — R922 Inconclusive mammogram: Secondary | ICD-10-CM | POA: Diagnosis not present

## 2021-12-02 DIAGNOSIS — R928 Other abnormal and inconclusive findings on diagnostic imaging of breast: Secondary | ICD-10-CM

## 2021-12-02 DIAGNOSIS — N6313 Unspecified lump in the right breast, lower outer quadrant: Secondary | ICD-10-CM | POA: Diagnosis not present

## 2021-12-21 ENCOUNTER — Other Ambulatory Visit: Payer: Self-pay | Admitting: Cardiology

## 2021-12-21 DIAGNOSIS — I2510 Atherosclerotic heart disease of native coronary artery without angina pectoris: Secondary | ICD-10-CM

## 2021-12-22 ENCOUNTER — Other Ambulatory Visit: Payer: Self-pay | Admitting: Cardiology

## 2021-12-22 DIAGNOSIS — I2510 Atherosclerotic heart disease of native coronary artery without angina pectoris: Secondary | ICD-10-CM

## 2021-12-24 DIAGNOSIS — M069 Rheumatoid arthritis, unspecified: Secondary | ICD-10-CM | POA: Diagnosis not present

## 2021-12-24 DIAGNOSIS — I10 Essential (primary) hypertension: Secondary | ICD-10-CM | POA: Diagnosis not present

## 2021-12-24 DIAGNOSIS — E782 Mixed hyperlipidemia: Secondary | ICD-10-CM | POA: Diagnosis not present

## 2021-12-28 ENCOUNTER — Telehealth: Payer: Self-pay | Admitting: Cardiology

## 2021-12-28 NOTE — Telephone Encounter (Signed)
Pt made aware of PharmD's recommendations about Eliquis.  She is aware that Plavix is not effective for afib, which is why the pt is on Eliquis for.  Pt states she would like our Prior Auth Nurse to give her a call back so that she could assist the pt with a PA for her eliquis, or aid in signing her up for pt assistance for this drug.  She states she is paying an extreme monthly payment for Eliquis, and cannot afford this long-term without some kind of assistance.   Will route this message to [facility] Nurse to further follow-up with the pt, about Eliquis cost needs.  Pt verbalized understanding and agrees with this plan.

## 2021-12-28 NOTE — Telephone Encounter (Signed)
Pt c/o medication issue:  1. Name of Medication: apixaban (ELIQUIS) 5 MG TABS tablet  2. How are you currently taking this medication (dosage and times per day)? As prescribed   3. Are you having a reaction (difficulty breathing--STAT)? No 4. What is your medication issue? Patient states they want to be taken off this medication due to reports of it being taken off the market because of it's "dangerous side effects". She states she would prefer going back to plavix and would like a call back to discuss this.

## 2021-12-28 NOTE — Telephone Encounter (Signed)
**Note De-Identified  Obfuscation** The pt is aware that we are leaving her a BMSPAF application for Eliquis asst and 2 weeks of Eliquis 5 mg samples for her to pick up in the front office.  I did provide her BMSPAFs phone number so she can call to ask questions about their Eliquis program and her eligibility for approval into the program.  She is aware that Warfarin is the only generic anticoagulant on the market and that all other are name brand and will cost her about he same as Eliquis.  She is aware to complete her part of the application (if she is advised that she is eligible for approval), obtain required documents per BMSPAF, and to bring all to the office to drop off ASAP and that we will take care of the providers page of her application and will fax all to [employer]MSPA[employer].  She thanked me for our assistance.

## 2021-12-28 NOTE — Telephone Encounter (Signed)
Will need to forward this message to our PharmD team to further advise if Eliquis is being taken off the market for its "dangerous side effects" for I have not heard this information until pt reported.   Will follow-up with the pt accordingly thereafter.

## 2021-12-28 NOTE — Telephone Encounter (Signed)
Unclear what patient is referring to. Eliquis has not been taken off the market and that have not been any new side effects noted.  It would not be appropriate to switch to Plavix since patient has atrial fibrillation and Plavix would not be an effective medication for stroke prophylaxis

## 2022-01-14 DIAGNOSIS — I10 Essential (primary) hypertension: Secondary | ICD-10-CM | POA: Diagnosis not present

## 2022-01-14 DIAGNOSIS — E782 Mixed hyperlipidemia: Secondary | ICD-10-CM | POA: Diagnosis not present

## 2022-01-14 DIAGNOSIS — M069 Rheumatoid arthritis, unspecified: Secondary | ICD-10-CM | POA: Diagnosis not present

## 2022-01-14 DIAGNOSIS — M0579 Rheumatoid arthritis with rheumatoid factor of multiple sites without organ or systems involvement: Secondary | ICD-10-CM | POA: Diagnosis not present

## 2022-01-14 DIAGNOSIS — Z6841 Body Mass Index (BMI) 40.0 and over, adult: Secondary | ICD-10-CM | POA: Diagnosis not present

## 2022-01-14 NOTE — Progress Notes (Deleted)
Cardiology Office Note:    Date:  01/14/2022   ID:  Cindy, Holmes Nov 04, 1950, MRN [id number]  PCP:  Redmond School, MD   [facility] Providers Cardiologist:  Freada Bergeron, MD {   Referring MD: Redmond School, MD    History of Present Illness:    Cindy Holmes is a 71 y.o. female with a hx of CAD s/p BMS to LAD and balloon angioplasty of D1 in 2001 and repeat balloon angioplasty to D1 in 3474, diastolic HF, RA, lupus on chronic immunosuppression, HTN, prior CVA and Afib who was previously followed by Dr. Meda Coffee who now presents to clinic for follow-up.  Per review of the record, the patient has a history of known CAD with BMS to the LAD and balloon angioplasty of D1 in 2001>>s/p balloon angioplasty of D1 in 2002>> in-stent restenosis of the proximal LAD however FFR negative in 03/2016. Underwent a Myoview stress test in 2020 which was low risk with no ischemia.  Echocardiogram in 2018 with hyperdynamic LV function with grade 2 DD.    Had admission in 10/2020 where she presented with several day history of nausea, vomiting, diarrhea and poor oral intake with a 16 pound weight loss. On ED presentation, she was noted to be in atrial fibrillation with RVR.  Abdominal CT was suspicious for diverticulitis therefore antibiotics were initiated. Given new onset AF, she was started on Eliquis 5 mg twice daily for CHA2DS2-VASc score of 6.  If bleeding risk was felt to be low, plan was to consider continuing her ASA 81 mg given her history of POBA and BMS. Had follow-up cardiac monitor that showed several episodes of SVT but no Afib.  Saw [name] in 03/2021 where she was having palpitations but were less frequent than previously. Also was having atypical chest pain. Myoview 03/2021 was normal and low risk.   Was last seen in clinic by me on 10/2021. She was not feeling well due to arthritic pain, LE edema, palpitations, and neck pain/ear fullness.  She was noted to have enlarged  left LN on her carotid ultrasound. Follow-up ultrasound showed multiple small rounded hypoechoic structures in the left submandibular region which are atypical in appearance for lymph nodes. Further evaluation with contrast enhanced soft tissue neck CT was recommended which showed no significant findings.  Today, ***   Past Medical History:  Diagnosis Date   CAD (coronary artery disease)    a. PTCA LAD 2001/cath 2002-prox and mid LAD stents patent, PTCA ostium Dx 1 b. cath 03/2016: patent LAD stent with less than 40% in-stent restenosis, 10-30% stenosis of D1 and distal LAD with continued medical management recommended. 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    Past  Surgical History:  Procedure Laterality Date   CARDIAC CATHETERIZATION     with stent placement in 2001   CARDIAC CATHETERIZATION N/A 03/12/2016   Procedure: Left Heart Cath and Coronary Angiography;  Surgeon: Nelva Bush, MD;  Location: [place] [facility];  Service: Cardiovascular;  Laterality: N/A;   CARDIAC CATHETERIZATION N/A 03/12/2016   Procedure: Intravascular Pressure Wire/FFR Study;  Surgeon: Nelva Bush, MD;  Location: [place] [facility];  Service: Cardiovascular;  Laterality: N/A;   CHOLECYSTECTOMY     CORONARY ANGIOPLASTY     2002    ESOPHAGOGASTRODUODENOSCOPY N/A 12/03/2016   Procedure: ESOPHAGOGASTRODUODENOSCOPY (EGD);  Surgeon: Carol Ada, MD;  Location: [place];  Service: Endoscopy;  Laterality: N/A;   laproscopic adjustable gastric  banding     with APS standard system.    OPEN REDUCTION INTERNAL FIXATION (ORIF) DISTAL RADIAL FRACTURE Right 05/15/2018   Procedure: OPEN REDUCTION INTERNAL FIXATION (ORIF) DISTAL RADIAL FRACTURE;  Surgeon: Shona Needles, MD;  Location: [place];  Service: Orthopedics;  Laterality: Right;   ORIF ANKLE FRACTURE Right 05/15/2018   Procedure: OPEN REDUCTION INTERNAL FIXATION (ORIF) ANKLE FRACTURE;  Surgeon: Shona Needles, MD;  Location: [place];  Service: Orthopedics;  Laterality: Right;   ORIF TIBIA PLATEAU Right 05/15/2018   Procedure: OPEN REDUCTION INTERNAL FIXATION (ORIF) TIBIAL PLATEAU;  Surgeon: Shona Needles, MD;  Location: [place];  Service: Orthopedics;  Laterality: Right;   Pt has 3 stents     sept 2001   SHOULDER ARTHROSCOPY WITH SUBACROMIAL DECOMPRESSION, ROTATOR CUFF REPAIR AND BICEP TENDON REPAIR Right 11/16/2013   Procedure: RIGHT SHOULDER ARTHROSCOPY, EXTENSIVE DEBRIDEMENT, ROTATOR CUFF REPAIR ;  Surgeon: [name], MD;  Location: [place];  Service: Orthopedics;  Laterality: Right;   TUBAL LIGATION      Current Medications: No outpatient medications have been marked as taking for the 01/18/22 encounter (Appointment) with Freada Bergeron, MD.     Allergies:   Codeine phosphate, Morphine and related, Amoxicillin, Penicillins, and Lidocaine   Social History   Socioeconomic History   Marital status: Married    Spouse name: Not on file   Number of children: Not on file   Years of education: Not on file   Highest education level: Not on file  Occupational History   Not on file  Tobacco Use   Smoking status: Never   Smokeless tobacco: Never  Vaping Use   Vaping Use: Never used  Substance and Sexual Activity   Alcohol use: No     Alcohol/week: 0.0 standard drinks of alcohol   Drug use: No   Sexual activity: Never  Other Topics Concern   Not on file  Social History Narrative   Not on file   Social Determinants of Health   Financial Resource Strain: Not on file  Food Insecurity: Not on file  Transportation Needs: Not on file  Physical Activity: Not on file  Stress: Not on file  Social Connections: Not on file     Family History: The patient's family history includes Arthritis/Rheumatoid in her father; Breast cancer (age of onset: 88) in her sister; Cancer in an other family member; Cirrhosis in her father; Colon cancer in her maternal grandmother; Diabetes in her brother and sister; Heart attack in her maternal aunt, maternal uncle, mother, and another family member; Heart disease in her brother, brother, maternal aunt, and mother; Throat cancer in her maternal aunt and maternal grandfather.  ROS:   Please see the history of present illness.  Review of Systems  Constitutional:  Negative for malaise/fatigue and weight loss.  HENT:  Positive for hearing loss (L-ear). Negative for congestion and sore throat.   Eyes:  Negative for blurred vision.  Respiratory:  Negative for cough and shortness of breath.   Cardiovascular:  Positive for palpitations and leg swelling (bilateral). Negative for chest pain, orthopnea, claudication and PND.  Gastrointestinal:  Negative for heartburn and nausea.  Genitourinary:  Negative for frequency and urgency.  Musculoskeletal:  Positive for falls, joint pain (bilateral legs especially L-knee) and neck pain (L-side). Negative for myalgias.  Skin:  Negative for rash.  Neurological:  Negative for dizziness and headaches.  Endo/Heme/Allergies:  Positive for environmental allergies. Does not bruise/bleed easily.  Psychiatric/Behavioral:  The patient is not nervous/anxious and does not have insomnia.    All other systems reviewed and are negative.  EKGs/Labs/Other Studies Reviewed:     The following studies were reviewed today: Myoview 03/2021: The study is normal. The study is low risk.   No ST deviation was noted.   Left ventricular function is normal. Nuclear stress EF: 78 %. The left ventricular ejection fraction is hyperdynamic (>65%).   Prior study available for comparison from 05/22/2019. No changes compared to prior study.   No evidence of ischemia or previous myocardial infarction.  Carotid US 01/19/21 Bilateral ICA 1-39   Incidental findings: Enlarged cervical lymph node anterior to the left  proximal/mid segment of the internal carotid artery, measuring 1.5 x .96 x  .88 cm.    LONG TERM MONITOR 01/02/2021 Narrative  Patch wear time was 12 days and 11 hours  Predominant rhythm was NSR with average HR 70bpm (ranging from 50-174bpm)  There were 368 episodes of SVT with the longest lasting 16.2 seconds at average rate 120bpm; fastest was 13 beats at max rate of 174bpm  Occasional SVE (2.3%), rare PVCs (<1%)  No AFib, VT or significant pauses   Echocardiogram 10/27/20 EF 60-65, no RWMA, mild LVH, GRII DD, normal RVSF, RVSP 31.1, mild LAE, mild MR, trivial AI   Carotid US 05/22/2019 Bilateral ICA 1-39   Myoview 05/22/2019 EF 86, no ischemia or infarction, low risk   Renal artery [place] 01/27/2017 Normal renal arteries, bilaterally Normal caliber abdominal aorta   Echocardiogram 12/03/2016 EF 65-70, normal wall motion, GR 2 DD, mild LAE   Cardiac catheterization 03/12/2016 LM normal LAD proximal stent patent w 40 ISR (FFR 0.84), dist stent patent w 10 ISR; D1 ost 30 LCx patent  RCA patent  EF 55-65  EKG:  EKG was not ordered today  Recent Labs: 10/19/2021: Hemoglobin 11.7; NT-Pro BNP 614; Platelets 281 11/03/2021: BUN 14; Creatinine, Ser 0.82; Potassium 4.3; Sodium 134  Recent Lipid Panel    Component Value Date/Time   CHOL 155 10/19/2021 1043   TRIG 122 10/19/2021 1043   HDL 79 10/19/2021 1043   CHOLHDL 2.0 10/19/2021 1043   CHOLHDL 2.3  03/12/2016 0252   VLDL 30 03/12/2016 0252   LDLCALC 55 10/19/2021 1043     Risk Assessment/Calculations:    CHA2DS2-VASc Score = 7   This indicates a 11.2% annual risk of stroke. The patient's score is based upon: CHF History: 1 HTN History: 1 Diabetes History: 0 Stroke History: 2 Vascular Disease History: 1 Age Score: 1 Gender Score: 1         Physical Exam:    VS:  There were no vitals taken for this visit.    Wt Readings from Last 3 Encounters:  10/19/21 232 lb  12.8 oz (105.6 kg)  03/30/21 232 lb (105.2 kg)  03/23/21 232 lb (105.2 kg)     GEN: Well nourished, well developed in no acute distress HEENT: Normal NECK: No JVD; No carotid bruits CARDIAC: RRR, no murmurs, rubs, gallops RESPIRATORY:  Clear to auscultation without rales, wheezing or rhonchi  ABDOMEN: Soft, non-tender, non-distended MUSCULOSKELETAL:  Trace-1+ pitting edema mainly around the left knee but does have trace ankle edema as well. Warm SKIN: Warm and dry NEUROLOGIC:  Alert and oriented x 3 PSYCHIATRIC:  Normal affect   ASSESSMENT:    No diagnosis found.  PLAN:    In order of problems listed above:  #Paroxysmal Afib: CHADs-vasc 6. Diagnosed in 10/2020 when she presented with diverticulitis. Has intermittent palpitations with one worse episode yesterday evening. Discussed that she can take an extra dose of metoprolol if she develops recurrent palpitations. Otherwise, tolerating apixaban without issue. -Continue metop '50mg'$  in AM and '25mg'$  in PM with additional '25mg'$  prn for breakthrough palpitations -Continue apixaban '5mg'$  BID  #CAD s/p PCI to LAD with BMS and POBA to D1 x2: Last myoview 03/2021 low risk with no ischemia or infarction. -Continue metop '50mg'$  in AM and '25mg'$  in PM  -Continue ramipril 2.'5mg'$  daily -Continue lipitor '40mg'$  daily, zetia '10mg'$  daily -Continue ASA '81mg'$  daily  #Chronic Diastolic HF: TTE 97/4163: LVEF 60-65%, G2DD, nomral RV, mild MR, mild LAE, trivial AI. Currently  euvolemic with mild LE edema on exam today with reports of chronic cough when laying down. Will check BNP and BMET today and adjust diuretics as needed. -Continue lasix '40mg'$  daily with additional dose as needed -Low Na diet  #HTN: Well controlled and at goal. -Continue metop '50mg'$  in AM and '25mg'$  in PM  -Continue ramipril 2.'5mg'$  daily  #HLD: -Continue lipitor '40mg'$  daily, zetia '10mg'$  daily  #Bilateral carotid artery stenosis: Minimal disease 1-39% bilaterally. -Continue surveillance with repeat ultrasounds every 1-2 years -Continue ASA and statin as above  #SLE: #RA: On chronic immunosuppression. -Management per Rheum  #Enlarged LN: Reassuring ultrasound and CT neck.        Medication Adjustments/Labs and Tests Ordered: Current medicines are reviewed at length with the patient today.  Concerns regarding medicines are outlined above.  No orders of the defined types were placed in this encounter.  No orders of the defined types were placed in this encounter.   There are no Patient Instructions on file for this visit.    Signed, Freada Bergeron, MD  01/14/2022 12:33 PM    [place]

## 2022-01-18 ENCOUNTER — Encounter: Payer: Self-pay | Admitting: Cardiology

## 2022-01-18 ENCOUNTER — Ambulatory Visit (INDEPENDENT_AMBULATORY_CARE_PROVIDER_SITE_OTHER): Payer: Medicare Other | Admitting: Cardiology

## 2022-01-18 VITALS — BP 136/69 | HR 60 | Ht 63.0 in | Wt 231.2 lb

## 2022-01-18 DIAGNOSIS — I480 Paroxysmal atrial fibrillation: Secondary | ICD-10-CM

## 2022-01-18 DIAGNOSIS — I5032 Chronic diastolic (congestive) heart failure: Secondary | ICD-10-CM

## 2022-01-18 DIAGNOSIS — I6523 Occlusion and stenosis of bilateral carotid arteries: Secondary | ICD-10-CM | POA: Diagnosis not present

## 2022-01-18 DIAGNOSIS — I10 Essential (primary) hypertension: Secondary | ICD-10-CM

## 2022-01-18 DIAGNOSIS — D6869 Other thrombophilia: Secondary | ICD-10-CM

## 2022-01-18 DIAGNOSIS — I2510 Atherosclerotic heart disease of native coronary artery without angina pectoris: Secondary | ICD-10-CM

## 2022-01-18 DIAGNOSIS — E782 Mixed hyperlipidemia: Secondary | ICD-10-CM

## 2022-01-18 MED ORDER — SPIRONOLACTONE 25 MG PO TABS: 25.0000 mg | ORAL_TABLET | Freq: Every day | ORAL | 3 refills | Status: DC

## 2022-01-18 MED ORDER — FUROSEMIDE 40 MG PO TABS: 40.0000 mg | ORAL_TABLET | Freq: Two times a day (BID) | ORAL | 6 refills | Status: DC | PRN

## 2022-01-18 NOTE — Patient Instructions (Signed)
Medication Instructions:  Your physician has recommended you make the following change in your medication:  1-Increase Spirolactone 25 mg by mouth daily. 2-Take Furosemide 40 mg by mouth twice daily as needed for edema.    *If you need a refill on your cardiac medications before your next appointment, please call your pharmacy*  Lab Work: Your physician recommends that you return for lab work in: 1 week for BMET  If you have labs (blood work) drawn today and your tests are completely normal, you will receive your results only by: [place] (if you have MyChart) OR A paper copy in the mail If you have any lab test that is abnormal or we need to change your treatment, we will call you to review the results.  Follow-Up: At [facility], you and your health needs are our priority.  As part of our continuing mission to provide you with exceptional heart care, we have created designated Provider Care Teams.  These Care Teams include your primary Cardiologist (physician) and Advanced Practice Providers (APPs -  Physician Assistants and Nurse Practitioners) who all work together to provide you with the care you need, when you need it.  We recommend signing up for the patient portal called "MyChart".  Sign up information is provided on this After Visit Summary.  MyChart is used to connect with patients for Virtual Visits (Telemedicine).  Patients are able to view lab/test results, encounter notes, upcoming appointments, etc.  Non-urgent messages can be sent to your provider as well.   To learn more about what you can do with MyChart, go to [email or web address].    Your next appointment:   6 month(s)  The format for your next appointment:   In Person  Provider:   Freada Bergeron, MD {  Important Information About Sugar

## 2022-01-18 NOTE — Progress Notes (Signed)
Cardiology Office Note:    Date:  01/18/2022   ID:  Cindy Holmes 1951/01/06, MRN [id number]  PCP:  Redmond School, MD   [facility] Providers Cardiologist:  Freada Bergeron, MD {   Referring MD: Redmond School, MD    History of Present Illness:    Cindy Holmes is a 71 y.o. female with a hx of CAD s/p BMS to LAD and balloon angioplasty of D1 in 2001 and repeat balloon angioplasty to D1 in 0539, diastolic HF, RA, lupus on chronic immunosuppression, HTN, prior CVA and Afib who was previously followed by Dr. Meda Coffee who now presents to clinic for follow-up.  Per review of the record, the patient has a history of known CAD with BMS to the LAD and balloon angioplasty of D1 in 2001>>s/p balloon angioplasty of D1 in 2002>> in-stent restenosis of the proximal LAD however FFR negative in 03/2016. Underwent a Myoview stress test in 2020 which was low risk with no ischemia.  Echocardiogram in 2018 with hyperdynamic LV function with grade 2 DD.    Had admission in 10/2020 where she presented with several day history of nausea, vomiting, diarrhea and poor oral intake with a 16 pound weight loss. On ED presentation, she was noted to be in atrial fibrillation with RVR.  Abdominal CT was suspicious for diverticulitis therefore antibiotics were initiated. Given new onset AF, she was started on Eliquis 5 mg twice daily for CHA2DS2-VASc score of 6.  If bleeding risk was felt to be low, plan was to consider continuing her ASA 81 mg given her history of POBA and BMS. Had follow-up cardiac monitor that showed several episodes of SVT but no Afib.  Saw [name] in 03/2021 where she was having palpitations but were less frequent than previously. Also was having atypical chest pain. Myoview 03/2021 was normal and low risk.   Was last seen in clinic by me on 10/2021. She was not feeling well due to arthritic pain, LE edema, palpitations, and neck pain/ear fullness.  She was noted to have enlarged  left LN on her carotid ultrasound. Follow-up ultrasound showed multiple small rounded hypoechoic structures in the left submandibular region which are atypical in appearance for lymph nodes. Further evaluation with contrast enhanced soft tissue neck CT was recommended which showed no significant findings.  Today, she is accompanied with her husband and has overall been okay. She is continuing to have significant joint pain in her knee and her right ankle which has limited her mobility. Has follow-up with ortho tomorrow. She is otherwise doing well from a CV standpoint. Continues to have intermittent palpitations but these are generally controlled with the metoprolol. No chest pain or SOB. LE controlled with lasix and spironolactone.  Past Medical History:  Diagnosis Date   CAD (coronary artery disease)    a. PTCA LAD 2001/cath 2002-prox and mid LAD stents patent, PTCA ostium Dx 1 b. cath 03/2016: patent LAD stent with less than 40% in-stent restenosis, 10-30% stenosis of D1 and distal LAD with continued medical management recommended. 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    Past Surgical History:  Procedure Laterality Date   CARDIAC CATHETERIZATION     with stent placement in 2001   CARDIAC CATHETERIZATION N/A 03/12/2016   Procedure: Left  Heart Cath and Coronary Angiography;  Surgeon: Nelva Bush, MD;  Location: [facility];  Service: Cardiovascular;  Laterality: N/A;   CARDIAC CATHETERIZATION N/A 03/12/2016   Procedure: Intravascular Pressure Wire/FFR Study;  Surgeon: Nelva Bush, MD;  Location: [facility];  Service: Cardiovascular;  Laterality: N/A;   CHOLECYSTECTOMY     CORONARY ANGIOPLASTY     2002   ESOPHAGOGASTRODUODENOSCOPY N/A 12/03/2016   Procedure: ESOPHAGOGASTRODUODENOSCOPY (EGD);  Surgeon: Carol Ada, MD;  Location: [facility];  Service: Endoscopy;  Laterality: N/A;   laproscopic adjustable gastric  banding     with APS standard system.    OPEN REDUCTION INTERNAL FIXATION (ORIF) DISTAL RADIAL FRACTURE Right 05/15/2018   Procedure: OPEN REDUCTION INTERNAL FIXATION (ORIF) DISTAL RADIAL FRACTURE;  Surgeon: Shona Needles, MD;  Location: [facility];  Service: Orthopedics;  Laterality: Right;   ORIF ANKLE FRACTURE Right 05/15/2018   Procedure: OPEN REDUCTION INTERNAL FIXATION (ORIF) ANKLE FRACTURE;  Surgeon: Shona Needles, MD;  Location: [place];  Service: Orthopedics;  Laterality: Right;   ORIF TIBIA PLATEAU Right 05/15/2018   Procedure: OPEN REDUCTION INTERNAL FIXATION (ORIF) TIBIAL PLATEAU;  Surgeon: Shona Needles, MD;  Location: [place];  Service: Orthopedics;  Laterality: Right;   Pt has 3 stents     sept 2001   SHOULDER ARTHROSCOPY WITH SUBACROMIAL DECOMPRESSION, ROTATOR CUFF REPAIR AND BICEP TENDON REPAIR Right 11/16/2013   Procedure: RIGHT SHOULDER ARTHROSCOPY, EXTENSIVE DEBRIDEMENT, ROTATOR CUFF REPAIR ;  Surgeon: [name], MD;  Location: [facility];  Service: Orthopedics;  Laterality: Right;   TUBAL LIGATION      Current Medications: Current Meds  Medication Sig   acetaminophen (TYLENOL) 500 MG tablet Take 1,000 mg by mouth every 6 (six) hours as needed for headache (pain).   allopurinol (ZYLOPRIM) 100 MG tablet Take 400 mg by mouth at bedtime.   apixaban (ELIQUIS) 5  MG TABS tablet Take 1 tablet (5 mg total) by mouth 2 (two) times daily.   aspirin EC 81 MG tablet Take 81 mg by mouth at bedtime. Swallow whole.   atorvastatin (LIPITOR) 40 MG tablet Take 2 tablets (80 mg total) by mouth daily   bisacodyl (DULCOLAX) 5 MG EC tablet Take 5 mg by mouth at bedtime as needed for moderate constipation.   diphenhydramine-acetaminophen (TYLENOL PM) 25-500 MG TABS tablet Take 1 tablet by mouth at bedtime.   ezetimibe (ZETIA) 10 MG tablet Take 1 tablet (10 mg total) by mouth daily.   folic acid (FOLVITE) 1 MG tablet Take 1 mg by mouth at bedtime.   hydroxychloroquine (PLAQUENIL) 200 MG tablet Take 200 mg by mouth at bedtime.   levocetirizine (XYZAL) 5 MG tablet Take 5 mg by mouth at bedtime.   Melatonin 3 MG TABS Take 3 mg by mouth at bedtime as needed (sleep).   methotrexate (RHEUMATREX) 2.5 MG tablet Take 10 mg by mouth every Sunday.   metoprolol tartrate (  LOPRESSOR) 25 MG tablet Take 1 tablet (25 mg total) by mouth 2 (two) times daily. To be taken with one tablet by mouth ( 50 mg ) 2 (two) times daily = ( 75 mg) total.   metoprolol tartrate (LOPRESSOR) 50 MG tablet Take 1 tablet (50 mg total) by mouth 2 (two) times daily. To be taken with one tablet by mouth ( 25 mg ) 2 (two) times daily = ( 75 mg) total.   nitroGLYCERIN (NITROSTAT) 0.4 MG SL tablet DISSOLVE 1 TABLET UNDER THE TONGUE EVERY 5 MINUTES AS  NEEDED FOR CHEST PAIN. MAX  OF 3 TABLETS IN 15 MINUTES. CALL 911 IF PAIN PERSISTS.   Omega-3 Fatty Acids (FISH OIL) 1000 MG CAPS Take 1,000 mg by mouth at bedtime.   Potassium Chloride ER 20 MEQ TBCR Take 10 mEq by mouth daily.   predniSONE (DELTASONE) 5 MG tablet Take 5 mg by mouth every morning.   ramipril (ALTACE) 2.5 MG capsule Take 1 capsule (2.5 mg total) by mouth daily.   valACYclovir (VALTREX) 1000 MG tablet Take 4,000 mg by mouth once as needed (fever blisters).   zolpidem (AMBIEN) 5 MG tablet Take 5 mg by mouth at bedtime.   [DISCONTINUED] furosemide (LASIX) 40  MG tablet TAKE 1 TABLET BY MOUTH  DAILY AND MAY TAKE 1  ADDITIONAL TABLET DAILY AS  NEEDED FOR SWELLING   [DISCONTINUED] spironolactone (ALDACTONE) 25 MG tablet Take 0.5 tablets (12.5 mg total) by mouth daily.     Allergies:   Codeine phosphate, Morphine and related, Amoxicillin, Penicillins, and Lidocaine   Social History   Socioeconomic History   Marital status: Married    Spouse name: Not on file   Number of children: Not on file   Years of education: Not on file   Highest education level: Not on file  Occupational History   Not on file  Tobacco Use   Smoking status: Never   Smokeless tobacco: Never  Vaping Use   Vaping Use: Never used  Substance and Sexual Activity   Alcohol use: No    Alcohol/week: 0.0 standard drinks of alcohol   Drug use: No   Sexual activity: Never  Other Topics Concern   Not on file  Social History Narrative   Not on file   Social Determinants of Health   Financial Resource Strain: Not on file  Food Insecurity: Not on file  Transportation Needs: Not on file  Physical Activity: Not on file  Stress: Not on file  Social Connections: Not on file     Family History: The patient's family history includes Arthritis/Rheumatoid in her father; Breast cancer (age of onset: 27) in her sister; Cancer in an other family member; Cirrhosis in her father; Colon cancer in her maternal grandmother; Diabetes in her brother and sister; Heart attack in her maternal aunt, maternal uncle, mother, and another family member; Heart disease in her brother, brother, maternal aunt, and mother; Throat cancer in her maternal aunt and maternal grandfather.  ROS:   Please see the history of present illness. (+) Palpitations (+) Shortness of Breath (+) Knee Discomfort (+) LE Edema All other systems are reviewed and negative.    EKGs/Labs/Other Studies Reviewed:    The following studies were reviewed today: Myoview 03/2021: The study is normal. The study is low risk.    No ST deviation was noted.   Left ventricular function is normal. Nuclear stress EF: 78 %. The left ventricular ejection fraction is hyperdynamic (>65%).   Prior study available  for comparison from 05/22/2019. No changes compared to prior study.   No evidence of ischemia or previous myocardial infarction.  Carotid US 01/19/21 Bilateral ICA 1-39   Incidental findings: Enlarged cervical lymph node anterior to the left  proximal/mid segment of the internal carotid artery, measuring 1.5 x .96 x  .88 cm.    LONG TERM MONITOR 01/02/2021 Narrative  Patch wear time was 12 days and 11 hours  Predominant rhythm was NSR with average HR 70bpm (ranging from 50-174bpm)  There were 368 episodes of SVT with the longest lasting 16.2 seconds at average rate 120bpm; fastest was 13 beats at max rate of 174bpm  Occasional SVE (2.3%), rare PVCs (<1%)  No AFib, VT or significant pauses   Echocardiogram 10/27/20 EF 60-65, no RWMA, mild LVH, GRII DD, normal RVSF, RVSP 31.1, mild LAE, mild MR, trivial AI   Carotid US 05/22/2019 Bilateral ICA 1-39   Myoview 05/22/2019 EF 86, no ischemia or infarction, low risk   Renal artery [place] 01/27/2017 Normal renal arteries, bilaterally Normal caliber abdominal aorta   Echocardiogram 12/03/2016 EF 65-70, normal wall motion, GR 2 DD, mild LAE   Cardiac catheterization 03/12/2016 LM normal LAD proximal stent patent w 40 ISR (FFR 0.84), dist stent patent w 10 ISR; D1 ost 30 LCx patent  RCA patent  EF 55-65  EKG:   01/18/2022 EKG: No new tracing  10/19/2021 EKG: EKG was not ordered.  Recent Labs: 10/19/2021: Hemoglobin 11.7; NT-Pro BNP 614; Platelets 281 11/03/2021: BUN 14; Creatinine, Ser 0.82; Potassium 4.3; Sodium 134  Recent Lipid Panel    Component Value Date/Time   CHOL 155 10/19/2021 1043   TRIG 122 10/19/2021 1043   HDL 79 10/19/2021 1043   CHOLHDL 2.0 10/19/2021 1043   CHOLHDL 2.3 03/12/2016 0252   VLDL 30 03/12/2016 0252   LDLCALC 55 10/19/2021  1043     Risk Assessment/Calculations:    CHA2DS2-VASc Score = 7   This indicates a 11.2% annual risk of stroke. The patient's score is based upon: CHF History: 1 HTN History: 1 Diabetes History: 0 Stroke History: 2 Vascular Disease History: 1 Age Score: 1 Gender Score: 1         Physical Exam:    VS:  BP 136/69   Pulse 60   Ht '5\' 3"'$  (1.6 m)   Wt 231 lb 3.2 oz (104.9 kg)   SpO2 96%   BMI 40.96 kg/m     Wt Readings from Last 3 Encounters:  01/18/22 231 lb 3.2 oz (104.9 kg)  10/19/21 232 lb 12.8 oz (105.6 kg)  03/30/21 232 lb (105.2 kg)     GEN: Well nourished, well developed in no acute distress HEENT: Normal NECK: No JVD; No carotid bruits CARDIAC: RRR, no murmurs, rubs, gallops RESPIRATORY:  Clear to auscultation without rales, wheezing or rhonchi  ABDOMEN: Soft, non-tender, non-distended MUSCULOSKELETAL:  Trace-1+ pitting edema bilaterally SKIN: Warm and dry NEUROLOGIC:  Alert and oriented x 3 PSYCHIATRIC:  Normal affect   ASSESSMENT:    1. Paroxysmal A-fib ([place])   2. Coronary artery disease involving native coronary artery of native heart without angina pectoris   3. Chronic heart failure with preserved ejection fraction ([place])   4. Mixed hyperlipidemia   5. PAF (paroxysmal atrial fibrillation) ([place])   6. Bilateral carotid artery stenosis   7. Essential hypertension   8. Secondary hypercoagulability disorder (HCC)    PLAN:    In order of problems listed above:  #Paroxysmal Afib: CHADs-vasc 6. Diagnosed in 10/2020  when she presented with diverticulitis. Currently overall well controlled with intermittent breakthrough episodes. Tolerating AC without issues. -Continue metop '50mg'$  in AM and '25mg'$  in PM with additional '25mg'$  prn for breakthrough palpitations -Continue apixaban '5mg'$  BID  #CAD s/p PCI to LAD with BMS and POBA to D1 x2: Last myoview 03/2021 low risk with no ischemia or infarction. -Continue metop '50mg'$  in AM and '25mg'$  in PM  -Continue  ramipril 2.'5mg'$  daily -Continue lipitor '40mg'$  daily, zetia '10mg'$  daily -Continue ASA '81mg'$  daily  #Chronic Diastolic HF: TTE 59/5638: LVEF 60-65%, G2DD, nomral RV, mild MR, mild LAE, trivial AI. Currently euvolemic with mild LE edema on exam today with reports of chronic cough when laying down. Will check BNP and BMET today and adjust diuretics as needed. -Continue lasix '40mg'$  daily with additional dose as needed -Increase spironolactone to '25mg'$  daily -BMET next week  #HTN: Well controlled and at goal. -Continue metop '50mg'$  in AM and '25mg'$  in PM  -Continue ramipril 2.'5mg'$  daily -Increase spironolactone to '25mg'$  daily  #HLD: -Continue lipitor '40mg'$  daily, zetia '10mg'$  daily  #Bilateral carotid artery stenosis: Minimal disease 1-39% bilaterally 01/2021. -Continue surveillance with repeat ultrasounds every 1-2 years -Continue ASA and statin as above  #SLE: #RA: On chronic immunosuppression. -Management per Rheum  #Enlarged LN: Reassuring ultrasound and CT neck.     Follow up in 6 months    Medication Adjustments/Labs and Tests Ordered: Current medicines are reviewed at length with the patient today.  Concerns regarding medicines are outlined above.  Orders Placed This Encounter  Procedures   Basic Metabolic Panel (BMET)   Meds ordered this encounter  Medications   spironolactone (ALDACTONE) 25 MG tablet    Sig: Take 1 tablet (25 mg total) by mouth daily.    Dispense:  90 tablet    Refill:  3   furosemide (LASIX) 40 MG tablet    Sig: Take 1 tablet (40 mg total) by mouth 2 (two) times daily as needed for edema or fluid.    Dispense:  60 tablet    Refill:  6    Requesting 1 year supply    Patient Instructions  Medication Instructions:  Your physician has recommended you make the following change in your medication:  1-Increase Spirolactone 25 mg by mouth daily. 2-Take Furosemide 40 mg by mouth twice daily as needed for edema.    *If you need a refill on your cardiac  medications before your next appointment, please call your pharmacy*  Lab Work: Your physician recommends that you return for lab work in: 1 week for BMET  If you have labs (blood work) drawn today and your tests are completely normal, you will receive your results only by: [place] (if you have MyChart) OR A paper copy in the mail If you have any lab test that is abnormal or we need to change your treatment, we will call you to review the results.  Follow-Up: At [facility], you and your health needs are our priority.  As part of our continuing mission to provide you with exceptional heart care, we have created designated Provider Care Teams.  These Care Teams include your primary Cardiologist (physician) and Advanced Practice Providers (APPs -  Physician Assistants and Nurse Practitioners) who all work together to provide you with the care you need, when you need it.  We recommend signing up for the patient portal called "MyChart".  Sign up information is provided on this After Visit Summary.  MyChart is used to connect with patients  for Virtual Visits (Telemedicine).  Patients are able to view lab/test results, encounter notes, upcoming appointments, etc.  Non-urgent messages can be sent to your provider as well.   To learn more about what you can do with MyChart, go to [email or web address].    Your next appointment:   6 month(s)  The format for your next appointment:   In Person  Provider:   Freada Bergeron, MD {  Important Information About Sugar           I,Tinashe Williams,acting as a scribe for Freada Bergeron, MD.,have documented all relevant documentation on the behalf of Freada Bergeron, MD,as directed by  Freada Bergeron, MD while in the presence of Freada Bergeron, MD.   I, Freada Bergeron, MD, have reviewed all documentation for this visit. The documentation on 01/18/22 for the exam, diagnosis, procedures, and orders are all  accurate and complete.

## 2022-01-19 ENCOUNTER — Telehealth: Payer: Self-pay

## 2022-01-19 DIAGNOSIS — M1712 Unilateral primary osteoarthritis, left knee: Secondary | ICD-10-CM | POA: Diagnosis not present

## 2022-01-19 DIAGNOSIS — M7632 Iliotibial band syndrome, left leg: Secondary | ICD-10-CM | POA: Diagnosis not present

## 2022-01-19 DIAGNOSIS — M7052 Other bursitis of knee, left knee: Secondary | ICD-10-CM | POA: Diagnosis not present

## 2022-01-19 NOTE — Telephone Encounter (Signed)
Pt assistance forms printed off from email and forms faxed to contact information provided on cover sheet.  Fax confirmation received.  Will route this message back to Prior Auth Nurse to make her aware of completion.

## 2022-01-19 NOTE — Telephone Encounter (Signed)
**Note De-Identified  Obfuscation** The pt left her completed BMSPAF application for Eliquis asst at the office with documents.  I have completed the providers page of her application and have emailed it to Dr Jacolyn Reedy nurse so she can obtain her signature, date it, and to fax all to BMSPAF at the fax number written on the cover letter included.

## 2022-01-27 ENCOUNTER — Other Ambulatory Visit: Payer: Medicare Other

## 2022-01-27 DIAGNOSIS — I2510 Atherosclerotic heart disease of native coronary artery without angina pectoris: Secondary | ICD-10-CM

## 2022-01-27 LAB — BASIC METABOLIC PANEL
BUN/Creatinine Ratio: 21 (ref 12–28)
BUN: 17 mg/dL (ref 8–27)
CO2: 25 mmol/L (ref 20–29)
Calcium: 9.3 mg/dL (ref 8.7–10.3)
Chloride: 94 mmol/L — ABNORMAL LOW (ref 96–106)
Creatinine, Ser: 0.8 mg/dL (ref 0.57–1.00)
Glucose: 109 mg/dL — ABNORMAL HIGH (ref 70–99)
Potassium: 4.3 mmol/L (ref 3.5–5.2)
Sodium: 135 mmol/L (ref 134–144)
eGFR: 79 mL/min/{1.73_m2} (ref >59)

## 2022-01-29 DIAGNOSIS — M1712 Unilateral primary osteoarthritis, left knee: Secondary | ICD-10-CM | POA: Diagnosis not present

## 2022-01-29 DIAGNOSIS — M25562 Pain in left knee: Secondary | ICD-10-CM | POA: Diagnosis not present

## 2022-01-29 DIAGNOSIS — M25662 Stiffness of left knee, not elsewhere classified: Secondary | ICD-10-CM | POA: Diagnosis not present

## 2022-02-14 DIAGNOSIS — M25562 Pain in left knee: Secondary | ICD-10-CM | POA: Diagnosis not present

## 2022-02-16 IMAGING — CT CT ABD-PELV W/ CM
2 of 5 series · 17 of 46 positions shown, 19 images · IV contrast (Omni 300)
Comparison: 05/14/2018

CLINICAL DATA: Abdominal pain

EXAM:
CT ABDOMEN AND PELVIS WITH CONTRAST
TECHNIQUE: Multidetector CT imaging of the abdomen and pelvis was performed
using the standard protocol following bolus administration of
intravenous contrast.
CONTRAST:  100mL OMNIPAQUE IOHEXOL 300 MG/ML  SOLN

[Series 3: a/p w/ 5mm · axial · 0.98mm/px · z∈[+822,+1282]mm · 14 of 104 slices shown, 16 images]
[im 6/104  soft-tissue]
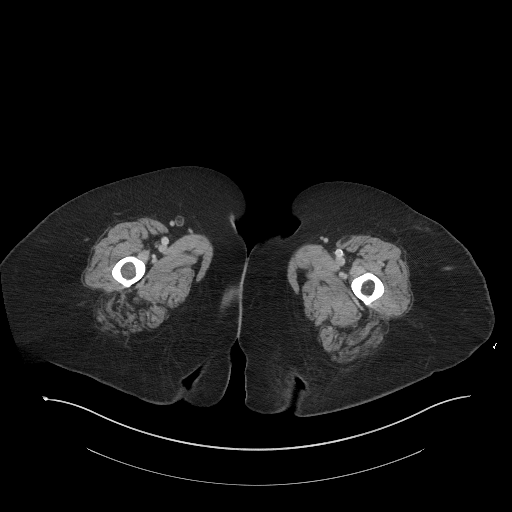
[im 6/104  bone]
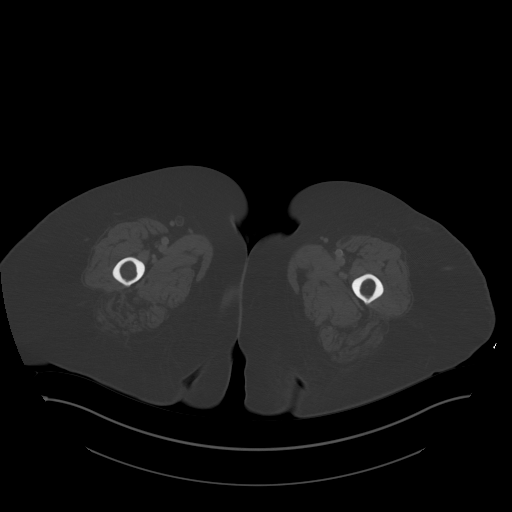
[im 16/104  soft-tissue]
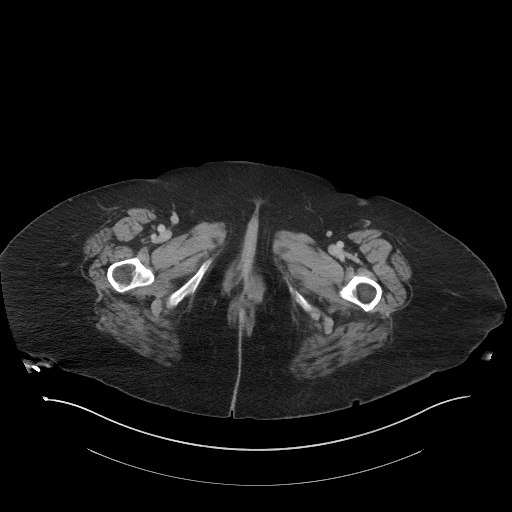
[im 21/104  soft-tissue]
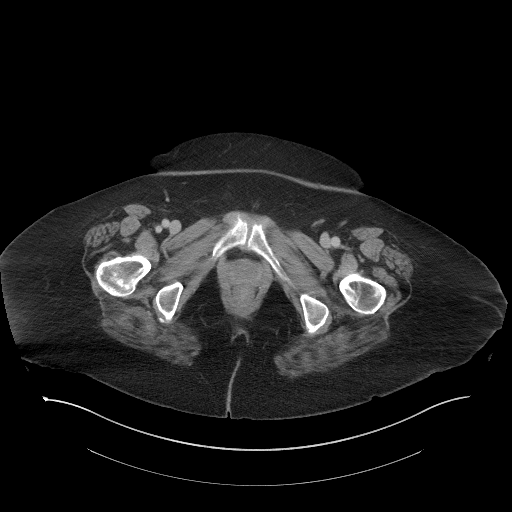
[im 26/104  soft-tissue]
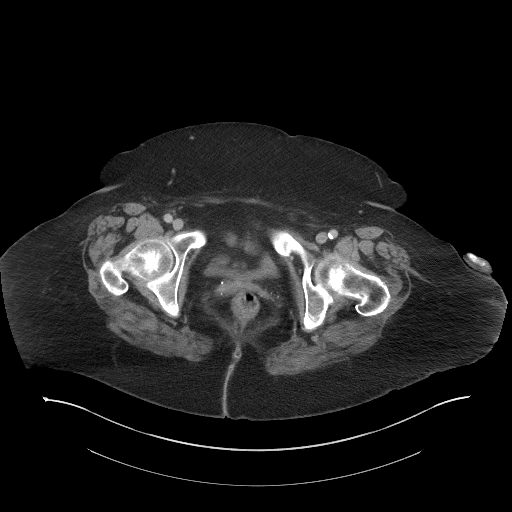
[im 37/104  soft-tissue]
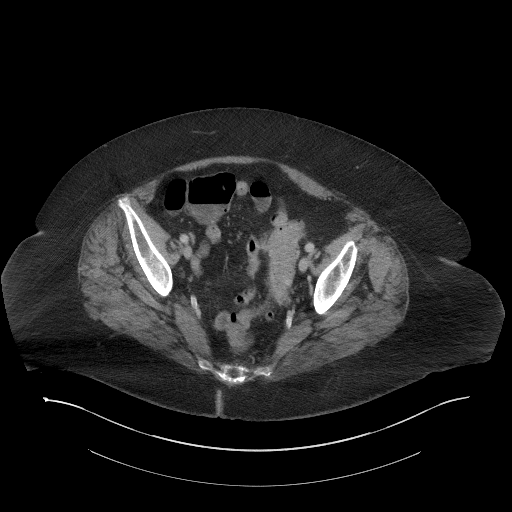
[im 42/104  soft-tissue]
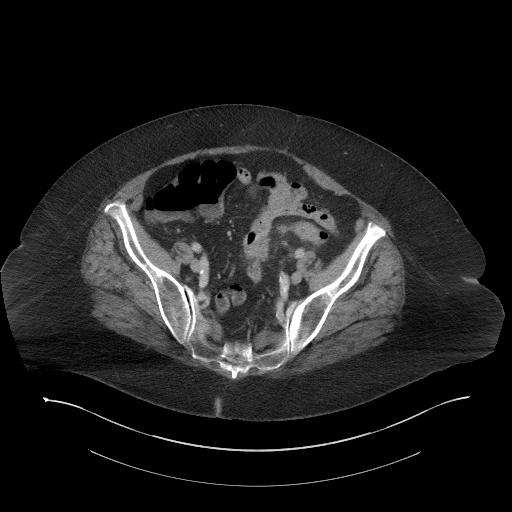
[im 47/104  soft-tissue]
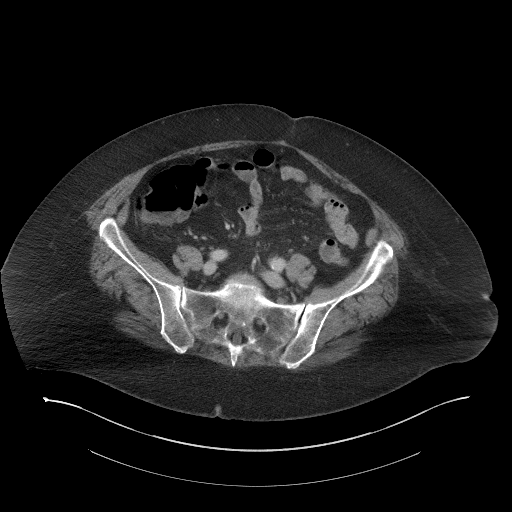
[im 57/104  soft-tissue]
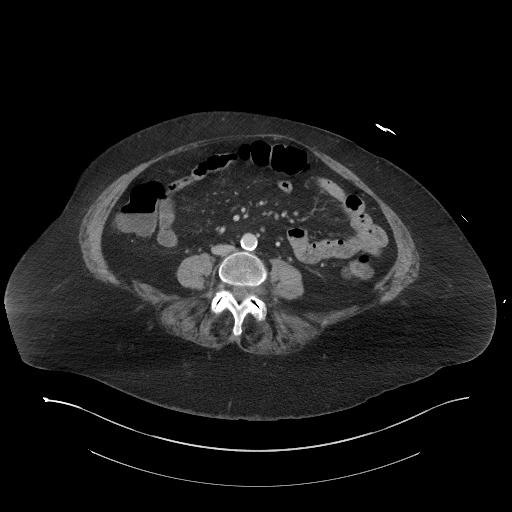
[im 62/104  soft-tissue]
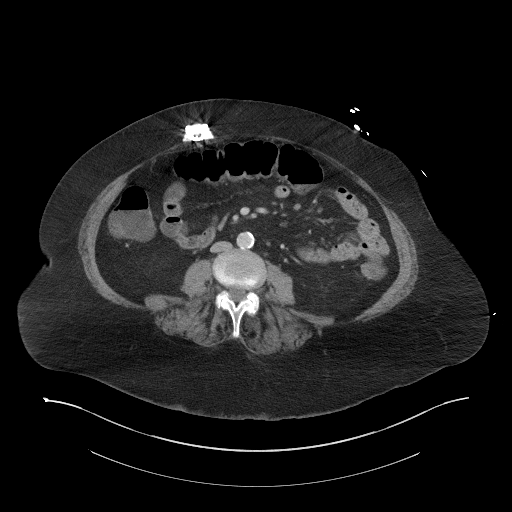
[im 62/104  bone]
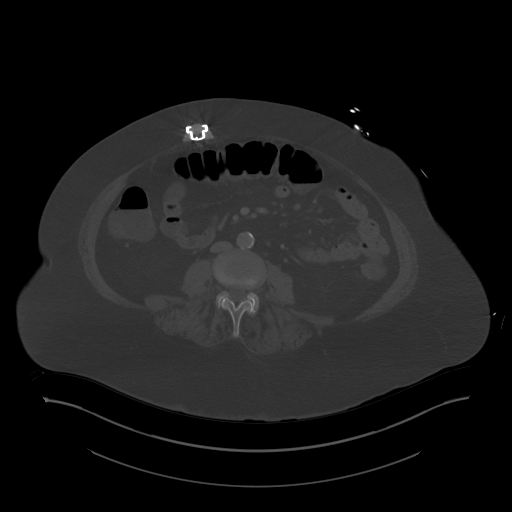
[im 67/104  soft-tissue]
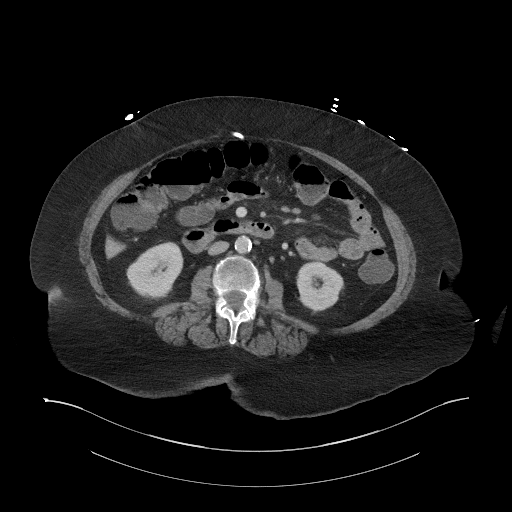
[im 78/104  soft-tissue]
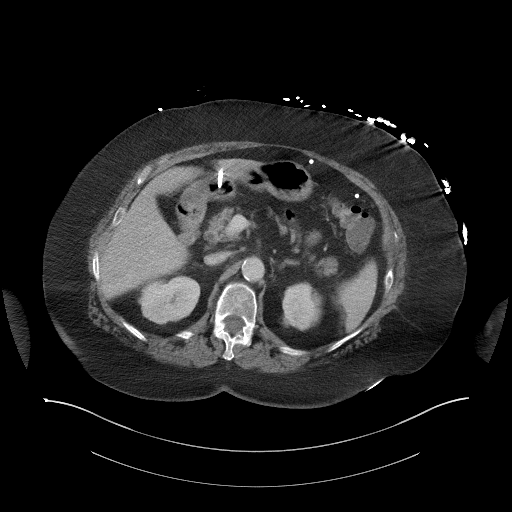
[im 83/104  soft-tissue]
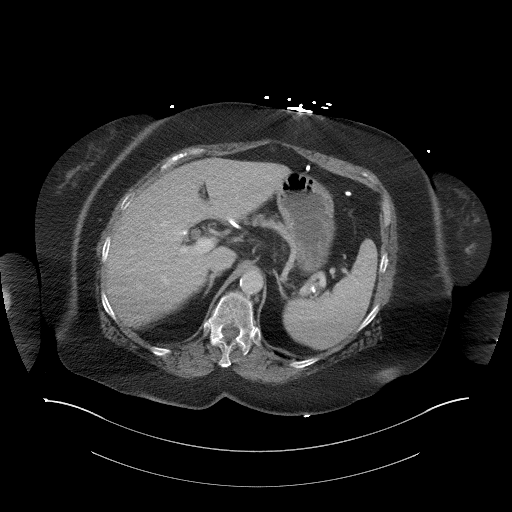
[im 88/104  soft-tissue]
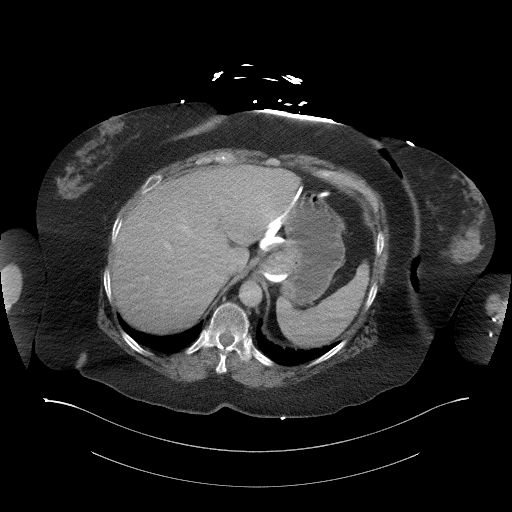
[im 98/104  soft-tissue]
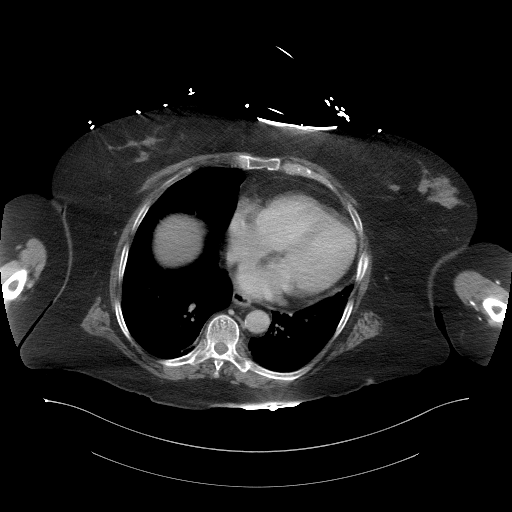

[Series 6: a/p w/ cor · coronal · 0.92mm/px · 3 of 152 slices shown]
[im 51/152  soft-tissue]
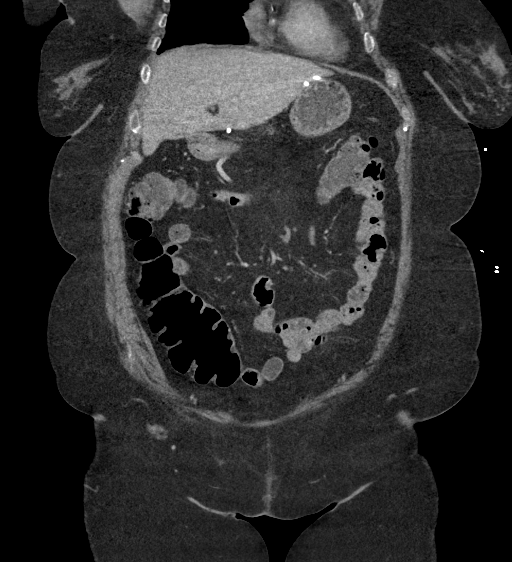
[im 68/152  soft-tissue]
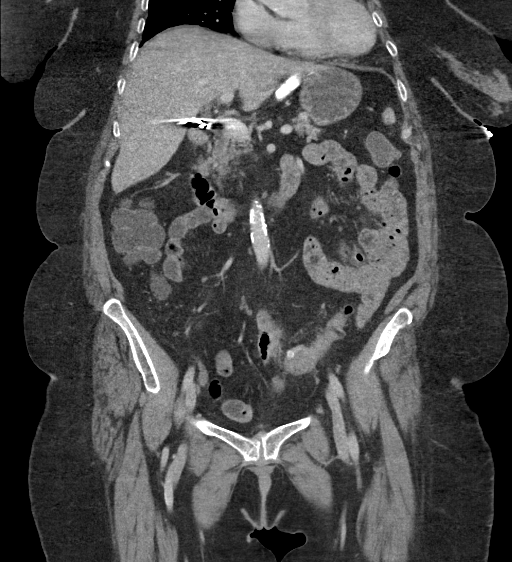
[im 84/152  soft-tissue]
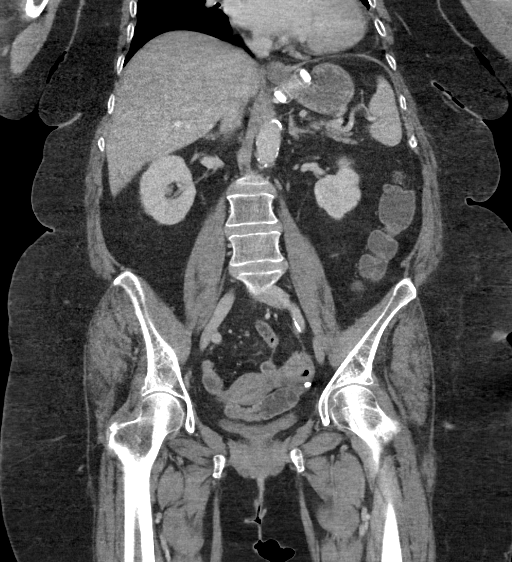

[17 of 46 positions shown; findings below may reference images not displayed]

FINDINGS: Lower chest: Coronary artery calcifications.  No acute abnormality.

Hepatobiliary: No focal liver abnormality is seen. Status post
cholecystectomy. No biliary dilatation.

Pancreas: No focal abnormality or ductal dilatation.

Spleen: No focal abnormality.  Normal size.

Adrenals/Urinary Tract: No adrenal abnormality. No focal renal
abnormality. No stones or hydronephrosis. Urinary bladder is
unremarkable.

Stomach/Bowel: Sigmoid diverticulosis. Inflammatory stranding around
the sigmoid colon compatible with active diverticulitis. Gastric lap
band noted in place. No complicating feature. Stomach and small
bowel decompressed.

Vascular/Lymphatic: Aortoiliac atherosclerosis. No evidence of
aneurysm or adenopathy.

Reproductive: Uterus and adnexa unremarkable.  No mass.

Other: No free fluid or free air.

Musculoskeletal: No acute bony abnormality.
IMPRESSION: Sigmoid diverticulosis. Slight inflammatory stranding around the mid
sigmoid colon compatible with active diverticulitis.

Ancillary findings as above.

## 2022-02-17 ENCOUNTER — Other Ambulatory Visit: Payer: Self-pay | Admitting: Physician Assistant

## 2022-02-23 DIAGNOSIS — M1712 Unilateral primary osteoarthritis, left knee: Secondary | ICD-10-CM | POA: Diagnosis not present

## 2022-03-15 ENCOUNTER — Other Ambulatory Visit: Payer: Self-pay

## 2022-03-15 ENCOUNTER — Encounter (HOSPITAL_COMMUNITY): Payer: Self-pay

## 2022-03-15 DIAGNOSIS — Z7982 Long term (current) use of aspirin: Secondary | ICD-10-CM | POA: Insufficient documentation

## 2022-03-15 DIAGNOSIS — M545 Low back pain, unspecified: Secondary | ICD-10-CM | POA: Diagnosis present

## 2022-03-15 DIAGNOSIS — M5442 Lumbago with sciatica, left side: Secondary | ICD-10-CM | POA: Insufficient documentation

## 2022-03-15 DIAGNOSIS — Z7901 Long term (current) use of anticoagulants: Secondary | ICD-10-CM | POA: Insufficient documentation

## 2022-03-15 NOTE — ED Triage Notes (Signed)
Pt to er, pt states that she is here for leg pain and back pain, states that she has had two silicone injection in her knee, but they are not helping.  States that her knee doctor was supposed to get her into see a back doctor, but they haven't yet.  States that she is here for back pain.

## 2022-03-16 ENCOUNTER — Emergency Department (HOSPITAL_COMMUNITY)
Admission: EM | Admit: 2022-03-16 | Discharge: 2022-03-16 | Disposition: A | Discharge status: Home / Self Care | Payer: Medicare Other | Attending: Emergency Medicine | Admitting: Emergency Medicine

## 2022-03-16 DIAGNOSIS — M5442 Lumbago with sciatica, left side: Secondary | ICD-10-CM | POA: Diagnosis not present

## 2022-03-16 DIAGNOSIS — M5432 Sciatica, left side: Secondary | ICD-10-CM

## 2022-03-16 MED ORDER — HYDROMORPHONE HCL 1 MG/ML IJ SOLN: 1.0000 mg | Freq: Once | INTRAMUSCULAR | Status: DC

## 2022-03-16 MED ORDER — DEXAMETHASONE SODIUM PHOSPHATE 10 MG/ML IJ SOLN
10.0000 mg | Freq: Once | INTRAMUSCULAR | Status: AC
  Administered 2022-03-16: 10 mg via INTRAMUSCULAR
  Filled 2022-03-16: qty 1

## 2022-03-16 MED ORDER — OXYCODONE-ACETAMINOPHEN 5-325 MG PO TABS: 1.0000 | ORAL_TABLET | Freq: Four times a day (QID) | ORAL | 0 refills | Status: DC | PRN

## 2022-03-16 MED ORDER — HYDROMORPHONE HCL 1 MG/ML IJ SOLN
1.0000 mg | Freq: Once | INTRAMUSCULAR | Status: AC
  Administered 2022-03-16: 1 mg via INTRAMUSCULAR
  Filled 2022-03-16: qty 1

## 2022-03-16 NOTE — ED Provider Notes (Signed)
[facility] EMERGENCY DEPARTMENT Provider Note   CSN: [id number] Arrival date & time: 03/15/22  2210     History  Chief Complaint  Patient presents with   Leg Pain    Cindy Holmes is a 71 y.o. female.  Patient presents to the emergency department complaining of left back pain.  Patient has had ongoing issues with her left knee for some time.  She is seeing orthopedics in [facility] for this.  Patient has a brace on her left knee and reports that since she has had the brace in place, she has developed pain in her lower back that radiates down the leg.       Home Medications Prior to Admission medications   Medication Sig Start Date End Date Taking? Authorizing Provider  oxyCODONE-acetaminophen (PERCOCET/ROXICET) 5-325 MG tablet Take 1 tablet by mouth every 6 (six) hours as needed for severe pain. 03/16/22  Yes Quentin Shorey, Gwenyth Allegra, MD  acetaminophen (TYLENOL) 500 MG tablet Take 1,000 mg by mouth every 6 (six) hours as needed for headache (pain).    [provider]  allopurinol (ZYLOPRIM) 100 MG tablet Take 400 mg by mouth at bedtime.    [provider]  apixaban (ELIQUIS) 5 MG TABS tablet Take 1 tablet (5 mg total) by mouth 2 (two) times daily. 12/10/20   Freada Bergeron, MD  aspirin EC 81 MG tablet Take 81 mg by mouth at bedtime. Swallow whole.    [provider]  atorvastatin (LIPITOR) 40 MG tablet Take 2 tablets (80 mg total) by mouth daily 12/10/20   Tommie Raymond, NP  bisacodyl (DULCOLAX) 5 MG EC tablet Take 5 mg by mouth at bedtime as needed for moderate constipation.    [provider]  diphenhydramine-acetaminophen (TYLENOL PM) 25-500 MG TABS tablet Take 1 tablet by mouth at bedtime. 10/28/20 01/18/22  George Hugh, MD  ezetimibe (ZETIA) 10 MG tablet Take 1 tablet (10 mg total) by mouth daily. 12/10/20   Kathyrn Drown D, NP  folic acid (FOLVITE) 1 MG tablet Take 1 mg by mouth at bedtime. 03/05/14   [provider]  furosemide  (LASIX) 40 MG tablet Take 1 tablet (40 mg total) by mouth 2 (two) times daily as needed for edema or fluid. 01/18/22   Freada Bergeron, MD  hydroxychloroquine (PLAQUENIL) 200 MG tablet Take 200 mg by mouth at bedtime. 09/03/20   [provider]  levocetirizine (XYZAL) 5 MG tablet Take 5 mg by mouth at bedtime. 04/04/18   [provider]  lovastatin (MEVACOR) 40 MG tablet Take 40 mg by mouth daily. Per patient taking 2 tablets of 40 mg to equal 80 mg daily 12/24/21   [provider]  Melatonin 3 MG TABS Take 3 mg by mouth at bedtime as needed (sleep).    [provider]  methotrexate (RHEUMATREX) 2.5 MG tablet Take 10 mg by mouth every Sunday. 08/08/20   [provider]  metoprolol tartrate (LOPRESSOR) 25 MG tablet TAKE 1 TABLET BY MOUTH  TWICE DAILY (TAKE ALONG  WITH ONE '50MG'$  TABLET FOR A  TOTAL OF '75MG'$  TWICE DAILY) 02/17/22   [name] T, PA-C  metoprolol tartrate (LOPRESSOR) 50 MG tablet TAKE 1 TABLET BY MOUTH  TWICE DAILY WITH '25MG'$   TABLET FOR A TOTAL DOSE OF  '75MG'$  02/17/22   Weaver, Scott T, PA-C  nitroGLYCERIN (NITROSTAT) 0.4 MG SL tablet DISSOLVE 1 TABLET UNDER THE TONGUE EVERY 5 MINUTES AS  NEEDED FOR CHEST PAIN. MAX  OF 3  TABLETS IN 15 MINUTES. CALL 911 IF PAIN PERSISTS. 05/13/21   [name] T, PA-C  Omega-3 Fatty Acids (FISH OIL) 1000 MG CAPS Take 1,000 mg by mouth at bedtime.    [provider]  Potassium Chloride ER 20 MEQ TBCR Take 10 mEq by mouth daily. 12/10/20   Tommie Raymond, NP  predniSONE (DELTASONE) 5 MG tablet Take 5 mg by mouth every morning. 09/03/20   [provider]  ramipril (ALTACE) 2.5 MG capsule Take 1 capsule (2.5 mg total) by mouth daily. 12/10/20   Kathyrn Drown D, NP  spironolactone (ALDACTONE) 25 MG tablet Take 1 tablet (25 mg total) by mouth daily. 01/18/22   Freada Bergeron, MD  valACYclovir (VALTREX) 1000 MG tablet Take 4,000 mg by mouth once as needed (fever blisters). 10/13/20   [provider]  zolpidem (AMBIEN) 5 MG tablet Take 5 mg by mouth at bedtime. 10/13/20   [provider]      Allergies    Codeine phosphate, Morphine and related, Amoxicillin, Penicillins, and Lidocaine    Review of Systems   Review of Systems  Physical Exam Updated Vital Signs BP (!) 169/76 (BP Location: Right Arm)   Pulse 93   Temp 98.1 F (36.7 C) (Oral)   Resp 18   Ht '5\' 3"'$  (1.6 m)   Wt 102.1 kg   SpO2 97%   BMI 39.86 kg/m  Physical Exam Vitals and nursing note reviewed.  Constitutional:      General: She is not in acute distress.    Appearance: She is well-developed.  HENT:     Head: Normocephalic and atraumatic.     Mouth/Throat:     Mouth: Mucous membranes are moist.  Eyes:     General: Vision grossly intact. Gaze aligned appropriately.     Extraocular Movements: Extraocular movements intact.     Conjunctiva/sclera: Conjunctivae normal.  Cardiovascular:     Rate and Rhythm: Normal rate and regular rhythm.     Pulses: Normal pulses.     Heart sounds: Normal heart sounds, S1 normal and S2 normal. No murmur heard.    No friction rub. No gallop.  Pulmonary:     Effort: Pulmonary effort is normal. No respiratory distress.     Breath sounds: Normal breath sounds.  Abdominal:     General: Bowel sounds are normal.     Palpations: Abdomen is soft.     Tenderness: There is no abdominal tenderness. There is no guarding or rebound.     Hernia: No hernia is present.  Musculoskeletal:        General: No swelling.     Cervical back: Full passive range of motion without pain, normal range of motion and neck supple. No spinous process tenderness or muscular tenderness. Normal range of motion.     Lumbar back: Tenderness present.     Left knee: No swelling, deformity, effusion or erythema. Tenderness present.     Right lower leg: No edema.     Left lower leg: No edema.  Skin:    General: Skin is warm and dry.     Capillary Refill: Capillary refill takes less than  2 seconds.     Findings: No ecchymosis, erythema, rash or wound.  Neurological:     General: No focal deficit present.     Mental Status: She is alert and oriented to person, place, and time.     GCS: GCS eye subscore is 4. GCS verbal subscore is 5. GCS motor  subscore is 6.     Cranial Nerves: Cranial nerves 2-12 are intact.     Sensory: Sensation is intact.     Motor: Motor function is intact.     Coordination: Coordination is intact.  Psychiatric:        Attention and Perception: Attention normal.        Mood and Affect: Mood normal.        Speech: Speech normal.        Behavior: Behavior normal.     ED Results / Procedures / Treatments   Labs (all labs ordered are listed, but only abnormal results are displayed) Labs Reviewed - No data to display  EKG None  Radiology No results found.  Procedures Procedures    Medications Ordered in ED Medications  dexamethasone (DECADRON) injection 10 mg (has no administration in time range)  HYDROmorphone (DILAUDID) injection 1 mg (has no administration in time range)    ED Course/ Medical Decision Making/ A&P                           Medical Decision Making  Patient with chronic knee pain, has developed left-sided low back pain with lumbar radiculopathy, likely secondary to antalgic gait.  This has been ongoing for weeks as well.  Her orthopedic doctor is supposedly getting her referred to a specialist for her back, presents tonight because she cannot tolerate the pain any longer.  No change in bowel or bladder function.  No saddle anesthesia, no foot drop.  No signs of acute neurosurgical emergency at this time, can continue outpatient work-up, will provide analgesia.        Final Clinical Impression(s) / ED Diagnoses Final diagnoses:  Sciatica of left side    Rx / DC Orders ED Discharge Orders          Ordered    oxyCODONE-acetaminophen (PERCOCET/ROXICET) 5-325 MG tablet  Every 6 hours PRN        03/16/22 0122               Orpah Greek, MD 03/16/22 [phone]

## 2022-03-17 DIAGNOSIS — Z6841 Body Mass Index (BMI) 40.0 and over, adult: Secondary | ICD-10-CM | POA: Diagnosis not present

## 2022-03-17 DIAGNOSIS — I10 Essential (primary) hypertension: Secondary | ICD-10-CM | POA: Diagnosis not present

## 2022-03-17 DIAGNOSIS — M0579 Rheumatoid arthritis with rheumatoid factor of multiple sites without organ or systems involvement: Secondary | ICD-10-CM | POA: Diagnosis not present

## 2022-03-17 DIAGNOSIS — M5430 Sciatica, unspecified side: Secondary | ICD-10-CM | POA: Diagnosis not present

## 2022-03-17 DIAGNOSIS — E6609 Other obesity due to excess calories: Secondary | ICD-10-CM | POA: Diagnosis not present

## 2022-03-17 MED FILL — Oxycodone w/ Acetaminophen Tab 5-325 MG: ORAL | Qty: 6 | Status: AC

## 2022-03-23 DIAGNOSIS — M1712 Unilateral primary osteoarthritis, left knee: Secondary | ICD-10-CM | POA: Diagnosis not present

## 2022-04-06 DIAGNOSIS — M84362A Stress fracture, left tibia, initial encounter for fracture: Secondary | ICD-10-CM | POA: Diagnosis not present

## 2022-04-27 DIAGNOSIS — M84352A Stress fracture, left femur, initial encounter for fracture: Secondary | ICD-10-CM | POA: Diagnosis not present

## 2022-04-30 DIAGNOSIS — M0579 Rheumatoid arthritis with rheumatoid factor of multiple sites without organ or systems involvement: Secondary | ICD-10-CM | POA: Diagnosis not present

## 2022-04-30 DIAGNOSIS — K219 Gastro-esophageal reflux disease without esophagitis: Secondary | ICD-10-CM | POA: Diagnosis not present

## 2022-04-30 DIAGNOSIS — I10 Essential (primary) hypertension: Secondary | ICD-10-CM | POA: Diagnosis not present

## 2022-04-30 DIAGNOSIS — Z1331 Encounter for screening for depression: Secondary | ICD-10-CM | POA: Diagnosis not present

## 2022-04-30 DIAGNOSIS — I4891 Unspecified atrial fibrillation: Secondary | ICD-10-CM | POA: Diagnosis not present

## 2022-04-30 DIAGNOSIS — Z6841 Body Mass Index (BMI) 40.0 and over, adult: Secondary | ICD-10-CM | POA: Diagnosis not present

## 2022-04-30 DIAGNOSIS — E559 Vitamin D deficiency, unspecified: Secondary | ICD-10-CM | POA: Diagnosis not present

## 2022-04-30 DIAGNOSIS — D518 Other vitamin B12 deficiency anemias: Secondary | ICD-10-CM | POA: Diagnosis not present

## 2022-04-30 DIAGNOSIS — K5732 Diverticulitis of large intestine without perforation or abscess without bleeding: Secondary | ICD-10-CM | POA: Diagnosis not present

## 2022-04-30 DIAGNOSIS — Z0001 Encounter for general adult medical examination with abnormal findings: Secondary | ICD-10-CM | POA: Diagnosis not present

## 2022-04-30 DIAGNOSIS — E039 Hypothyroidism, unspecified: Secondary | ICD-10-CM | POA: Diagnosis not present

## 2022-04-30 DIAGNOSIS — G894 Chronic pain syndrome: Secondary | ICD-10-CM | POA: Diagnosis not present

## 2022-05-04 ENCOUNTER — Encounter: Payer: Self-pay | Admitting: Emergency Medicine

## 2022-05-04 ENCOUNTER — Ambulatory Visit
Admission: EM | Admit: 2022-05-04 | Discharge: 2022-05-04 | Disposition: A | Discharge status: Home / Self Care | Payer: Medicare Other | Attending: Nurse Practitioner | Admitting: Nurse Practitioner

## 2022-05-04 DIAGNOSIS — M5441 Lumbago with sciatica, right side: Secondary | ICD-10-CM

## 2022-05-04 DIAGNOSIS — M5442 Lumbago with sciatica, left side: Secondary | ICD-10-CM

## 2022-05-04 MED ORDER — PREDNISONE 20 MG PO TABS: 40.0000 mg | ORAL_TABLET | Freq: Every day | ORAL | 0 refills | Status: AC

## 2022-05-04 MED ORDER — DEXAMETHASONE SODIUM PHOSPHATE 10 MG/ML IJ SOLN
10.0000 mg | INTRAMUSCULAR | Status: AC
  Administered 2022-05-04: 10 mg via INTRAMUSCULAR

## 2022-05-04 MED ORDER — TIZANIDINE HCL 2 MG PO TABS: 2.0000 mg | ORAL_TABLET | Freq: Three times a day (TID) | ORAL | 0 refills | Status: DC | PRN

## 2022-05-04 NOTE — ED Triage Notes (Signed)
Has been dealing with knee problems on  left side x 1 year.  States knee is making her lower back hurt while trying to protect knee.  Pain is across lower back.

## 2022-05-04 NOTE — Discharge Instructions (Addendum)
Take medication as prescribed.  May take arthritis strength Tylenol as needed for pain or discomfort, you can purchase this over-the-counter. Continue use of ice or heat as needed for back pain. As discussed, because your pain is chronic, I recommend that you see a specialist for worsening back pain. Gentle stretching and range of motion exercises as tolerated to help with back pain or discomfort. Go to the emergency department immediately if you develop loss of bowel or bladder function, become unable to walk due to your back pain, have weakness in your legs or feet, or other concerns. Follow-up as needed.

## 2022-05-04 NOTE — ED Provider Notes (Signed)
[facility]    CSN: [id number] Arrival date & time: 05/04/22  1519      History   Chief Complaint No chief complaint on file.   HPI Cindy Holmes is a 71 y.o. female.   The history is provided by the patient and the spouse.   Patient presents for complaints of chronic low back pain.  Patient has a history of bilateral knee pain with surgery to the right lower extremity.  She states that she has subsequently compensated for her left knee pain and now has low back pain.  She states most recently, she was diagnosed with fractures to the left knee which has caused her to become more sedentary.  Patiently is currently in a wheelchair.  States that she has to be nonweightbearing to allow healing of the left knee.  She states that she does get up at home to go to the restroom with the use of a walker.  She does have a history of sciatica.  States that she was seen in the emergency department in September and was given "a shot" and sent home.  She states that she needs help because she is in chronic pain.  Patient is inquiring about a muscle relaxer and a steroid injection.  She states that she has tried the use of ice, heat, and massage with minimal relief.  She is in the process of seeing a surgeon for possible left total knee replacement once the fractures in her left knee heal.  Past Medical History:  Diagnosis Date   CAD (coronary artery disease)    a. PTCA LAD 2001/cath 2002-prox and mid LAD stents patent, PTCA ostium Dx 1 b. cath 03/2016: patent LAD stent with less than 40% in-stent restenosis, 10-30% stenosis of D1 and distal LAD with continued medical management recommended. 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    Patient Active Problem List   Diagnosis Date Noted   Diverticulitis 10/26/2020   Atrial fibrillation with RVR ([place]) 10/26/2020   Acute diverticulitis 10/26/2020   Nausea vomiting and diarrhea    Closed displaced trimalleolar fracture of right ankle 05/28/2018   Fall at home, initial encounter 05/17/2018   Multiple fractures 05/14/2018   Fracture of distal end of right radius 05/14/2018   Tibial plateau fracture, right, closed, initial encounter 05/14/2018   Candida esophagitis ([place]) 12/03/2016   Hypokalemia 12/02/2016   Lactic acidosis 12/02/2016   AKI (acute kidney injury) ([place]) 12/02/2016   Chronic diastolic CHF (congestive heart failure) ([place]) 12/02/2016   Coronary artery disease involving native coronary artery of native heart without angina pectoris 11/13/2015   Chest pain 11/13/2015   Hyperlipemia 11/13/2015   Acute on chronic diastolic CHF (congestive heart failure),  NYHA class 3 ([place]) 11/13/2015   Pain in joint, shoulder region 01/16/2014   Muscle weakness (generalized) 01/16/2014   Decreased range of motion of right shoulder 01/16/2014   Right rotator cuff tear 11/16/2013   Rotator cuff tear 11/16/2013   Rheumatoid arthritis ([place])    Essential hypertension    Hyperlipidemia    Stroke [facility])    Emotional stress reaction    Leg pain    Carotid artery disease ([place])    Overweight 11/04/2008   EDEMA 11/04/2008    Past Surgical History:  Procedure Laterality Date   CARDIAC CATHETERIZATION     with stent placement in 2001   CARDIAC CATHETERIZATION N/A 03/12/2016    Procedure: Left Heart Cath and Coronary Angiography;  Surgeon: Nelva Bush, MD;  Location: [place] [facility];  Service: Cardiovascular;  Laterality: N/A;   CARDIAC CATHETERIZATION N/A 03/12/2016   Procedure: Intravascular Pressure Wire/FFR Study;  Surgeon: Nelva Bush, MD;  Location: [place] [facility];  Service: Cardiovascular;  Laterality: N/A;   CHOLECYSTECTOMY     CORONARY ANGIOPLASTY     2002   ESOPHAGOGASTRODUODENOSCOPY N/A 12/03/2016   Procedure: ESOPHAGOGASTRODUODENOSCOPY (EGD);  Surgeon: Carol Ada, MD;  Location: [place];  Service: Endoscopy;  Laterality: N/A;   laproscopic adjustable gastric  banding     with APS standard system.    OPEN REDUCTION INTERNAL FIXATION (ORIF) DISTAL RADIAL FRACTURE Right 05/15/2018   Procedure: OPEN REDUCTION INTERNAL FIXATION (ORIF) DISTAL RADIAL FRACTURE;  Surgeon: Shona Needles, MD;  Location: [place];  Service: Orthopedics;  Laterality: Right;   ORIF ANKLE FRACTURE Right 05/15/2018   Procedure: OPEN REDUCTION INTERNAL FIXATION (ORIF) ANKLE FRACTURE;  Surgeon: Shona Needles, MD;  Location: [place];  Service: Orthopedics;  Laterality: Right;   ORIF TIBIA PLATEAU Right 05/15/2018   Procedure: OPEN REDUCTION INTERNAL FIXATION (ORIF) TIBIAL PLATEAU;  Surgeon: Shona Needles, MD;  Location: [place];  Service: Orthopedics;  Laterality: Right;   Pt has 3 stents     sept 2001   SHOULDER ARTHROSCOPY WITH SUBACROMIAL DECOMPRESSION, ROTATOR CUFF REPAIR AND BICEP TENDON REPAIR Right 11/16/2013   Procedure: RIGHT SHOULDER ARTHROSCOPY, EXTENSIVE DEBRIDEMENT, ROTATOR CUFF REPAIR ;  Surgeon: [name], MD;  Location: [place];  Service: Orthopedics;  Laterality: Right;   TUBAL LIGATION      OB History   No obstetric history on file.      Home Medications    Prior to Admission medications   Medication Sig Start Date End Date Taking? Authorizing Provider  predniSONE (DELTASONE) 20 MG tablet Take 2 tablets (40 mg total) by  mouth daily with breakfast for 5 days. 05/04/22 05/09/22 Yes Dushawn Pusey-Warren, Alda Lea, NP  tiZANidine (ZANAFLEX) 2 MG tablet Take 1 tablet (2 mg total) by mouth every 8 (eight) hours as needed for muscle spasms. 05/04/22  Yes Nakita Santerre-Warren, Alda Lea, NP  acetaminophen (TYLENOL) 500 MG tablet Take 1,000 mg by mouth every 6 (six) hours as needed for headache (pain).    [provider]  allopurinol (ZYLOPRIM) 100 MG tablet Take 400 mg by mouth at bedtime.    [provider]  apixaban (ELIQUIS) 5 MG TABS tablet Take 1 tablet (5 mg total) by mouth 2 (two) times daily. 12/10/20   Freada Bergeron, MD  aspirin EC 81 MG tablet Take 81 mg by mouth at bedtime. Swallow whole.    [provider]  atorvastatin (LIPITOR) 40 MG tablet Take 2 tablets (80 mg total) by mouth daily  12/10/20   Tommie Raymond, NP  bisacodyl (DULCOLAX) 5 MG EC tablet Take 5 mg by mouth at bedtime as needed for moderate constipation.    [provider]  diphenhydramine-acetaminophen (TYLENOL PM) 25-500 MG TABS tablet Take 1 tablet by mouth at bedtime. 10/28/20 01/18/22  George Hugh, MD  ezetimibe (ZETIA) 10 MG tablet Take 1 tablet (10 mg total) by mouth daily. 12/10/20   Kathyrn Drown D, NP  folic acid (FOLVITE) 1 MG tablet Take 1 mg by mouth at bedtime. 03/05/14   [provider]  furosemide (LASIX) 40 MG tablet Take 1 tablet (40 mg total) by mouth 2 (two) times daily as needed for edema or fluid. 01/18/22   Freada Bergeron, MD  hydroxychloroquine (PLAQUENIL) 200 MG tablet Take 200 mg by mouth at bedtime. 09/03/20   [provider]  levocetirizine (XYZAL) 5 MG tablet Take 5 mg by mouth at bedtime. 04/04/18   [provider]  lovastatin (MEVACOR) 40 MG tablet Take 40 mg by mouth daily. Per patient taking 2 tablets of 40 mg to equal 80 mg daily 12/24/21   [provider]  Melatonin 3 MG TABS Take 3 mg by mouth at bedtime as needed (sleep).    [provider]   methotrexate (RHEUMATREX) 2.5 MG tablet Take 10 mg by mouth every Sunday. 08/08/20   [provider]  metoprolol tartrate (LOPRESSOR) 25 MG tablet TAKE 1 TABLET BY MOUTH  TWICE DAILY (TAKE ALONG  WITH ONE '50MG'$  TABLET FOR A  TOTAL OF '75MG'$  TWICE DAILY) 02/17/22   [name] T, PA-C  metoprolol tartrate (LOPRESSOR) 50 MG tablet TAKE 1 TABLET BY MOUTH  TWICE DAILY WITH '25MG'$   TABLET FOR A TOTAL DOSE OF  '75MG'$  02/17/22   Weaver, Scott T, PA-C  nitroGLYCERIN (NITROSTAT) 0.4 MG SL tablet DISSOLVE 1 TABLET UNDER THE TONGUE EVERY 5 MINUTES AS  NEEDED FOR CHEST PAIN. MAX  OF 3 TABLETS IN 15 MINUTES. CALL 911 IF PAIN PERSISTS. 05/13/21   [name] T, PA-C  Omega-3 Fatty Acids (FISH OIL) 1000 MG CAPS Take 1,000 mg by mouth at bedtime.    [provider]  oxyCODONE-acetaminophen (PERCOCET/ROXICET) 5-325 MG tablet Take 1 tablet by mouth every 6 (six) hours as needed for severe pain. 03/16/22   Orpah Greek, MD  Potassium Chloride ER 20 MEQ TBCR Take 10 mEq by mouth daily. 12/10/20   Kathyrn Drown D, NP  ramipril (ALTACE) 2.5 MG capsule Take 1 capsule (2.5 mg total) by mouth daily. 12/10/20   Kathyrn Drown D, NP  spironolactone (ALDACTONE) 25 MG tablet Take 1 tablet (25 mg total) by mouth daily. 01/18/22   Freada Bergeron, MD  valACYclovir (VALTREX) 1000 MG tablet Take 4,000 mg by mouth once as needed (fever blisters). 10/13/20   [provider]  zolpidem (AMBIEN) 5 MG tablet Take 5 mg by mouth at bedtime. 10/13/20   [provider]    Family History Family History  Problem Relation Age of Onset   Heart attack Mother    Heart disease Mother    Arthritis/Rheumatoid Father    Cirrhosis Father        hepatic cirrhosis   Breast cancer Sister 29   Diabetes Sister    Heart disease Maternal Aunt    Heart attack Maternal Aunt    Throat cancer Maternal Aunt    Heart attack Maternal Uncle    Colon cancer Maternal Grandmother    Throat cancer Maternal Grandfather  Diabetes Brother    Heart disease Brother    Heart disease Brother    Cancer Other        family history   Heart attack Other        family history    Social History Social History   Tobacco Use   Smoking status: Never   Smokeless tobacco: Never  Vaping Use   Vaping Use: Never used  Substance Use Topics   Alcohol use: No    Alcohol/week: 0.0 standard drinks of alcohol   Drug use: No     Allergies   Codeine phosphate, Morphine and related, Amoxicillin, Penicillins, and Lidocaine   Review of Systems Review of Systems Per HPI  Physical Exam Triage Vital Signs ED Triage Vitals [05/04/22 1526]  Enc Vitals Group     BP 122/71     Pulse Rate 77     Resp 18     Temp 98.1 F (36.7 C)     Temp Source Oral     SpO2 95 %     Weight      Height      Head Circumference      Peak Flow      Pain Score 10     Pain Loc      Pain Edu?      Excl. in [place]?    No data found.  Updated Vital Signs BP 122/71 (BP Location: Left Arm)   Pulse 77   Temp 98.1 F (36.7 C) (Oral)   Resp 18   SpO2 95%   Visual Acuity Right Eye Distance:   Left Eye Distance:   Bilateral Distance:    Right Eye Near:   Left Eye Near:    Bilateral Near:     Physical Exam Vitals and nursing note reviewed.  Constitutional:      General: She is not in acute distress.    Appearance: Normal appearance.  HENT:     Head: Normocephalic.  Cardiovascular:     Rate and Rhythm: Normal rate and regular rhythm.     Pulses: Normal pulses.     Heart sounds: Normal heart sounds.  Pulmonary:     Effort: Pulmonary effort is normal.     Breath sounds: Normal breath sounds.  Abdominal:     General: Bowel sounds are normal.     Palpations: Abdomen is soft.  Musculoskeletal:     Lumbar back: Tenderness present. No swelling, edema or deformity. Decreased range of motion. Positive right straight leg raise test and positive left straight leg raise test.  Skin:    General: Skin is warm and dry.   Neurological:     General: No focal deficit present.     Mental Status: She is alert and oriented to person, place, and time.  Psychiatric:        Mood and Affect: Mood normal.        Behavior: Behavior normal.      UC Treatments / Results  Labs (all labs ordered are listed, but only abnormal results are displayed) Labs Reviewed - No data to display  EKG   Radiology No results found.  Procedures Procedures (including critical care time)  Medications Ordered in UC Medications  dexamethasone (DECADRON) injection 10 mg (10 mg Intramuscular Given 05/04/22 1608)    Initial Impression / Assessment and Plan / UC Course  I have reviewed the triage vital signs and the nursing notes.  Pertinent labs & imaging results that were available during my  care of the patient were reviewed by me and considered in my medical decision making (see chart for details).  Patient presents for complaints of low back pain.  Symptoms appear to be chronic in nature, along with patient's history of sciatica.  Patient's vital signs are stable, she is in no apparent distress.  No red flag symptoms were noted on her exam.  Symptoms are consistent with bilateral sciatica at this time.  Suspect symptoms have been aggravated by her being sedentary due to her knee.  Decadron 10 mg IM injection was given in the clinic.  Patient was prescribed prednisone 40 mg daily for the next 5 days and tizanidine 2 mg for spasms.  Patient was advised to continue the use of ice and heat as she currently is doing.  Recommended that she follow-up with the spine specialist to help her with her continued symptoms.  Patient was given strict indications of when to go to the emergency department.  Patient verbalizes understanding.  All questions were answered.  Patient is stable for discharge.  Final Clinical Impressions(s) / UC Diagnoses   Final diagnoses:  Bilateral low back pain with bilateral sciatica, unspecified chronicity      Discharge Instructions      Take medication as prescribed.  May take arthritis strength Tylenol as needed for pain or discomfort, you can purchase this over-the-counter. Continue use of ice or heat as needed for back pain. As discussed, because your pain is chronic, I recommend that you see a specialist for worsening back pain. Gentle stretching and range of motion exercises as tolerated to help with back pain or discomfort. Go to the emergency department immediately if you develop loss of bowel or bladder function, become unable to walk due to your back pain, have weakness in your legs or feet, or other concerns. Follow-up as needed.      ED Prescriptions     Medication Sig Dispense Auth. Provider   predniSONE (DELTASONE) 20 MG tablet Take 2 tablets (40 mg total) by mouth daily with breakfast for 5 days. 10 tablet Krikor Willet-Warren, Alda Lea, NP   tiZANidine (ZANAFLEX) 2 MG tablet Take 1 tablet (2 mg total) by mouth every 8 (eight) hours as needed for muscle spasms. 30 tablet Page Lancon-Warren, Alda Lea, NP      PDMP not reviewed this encounter.   Tish Men, NP 05/04/22 1649

## 2022-05-05 DIAGNOSIS — Z23 Encounter for immunization: Secondary | ICD-10-CM | POA: Diagnosis not present

## 2022-05-09 NOTE — Progress Notes (Unsigned)
Cardiology Office Note:    Date:  05/12/2022   ID:  Cindy Holmes, DOB 1950-10-21, MRN [id number]  PCP:  Redmond School, MD   [facility] Providers Cardiologist:  Freada Bergeron, MD {   Referring MD: Redmond School, MD    History of Present Illness:    Cindy Holmes is a 71 y.o. female with a hx of CAD s/p BMS to LAD and balloon angioplasty of D1 in 2001 and repeat balloon angioplasty to D1 in 4315, diastolic HF, RA, lupus on chronic immunosuppression, HTN, prior CVA and Afib who was previously followed by Dr. Meda Coffee who now presents to clinic for follow-up.  Per review of the record, the patient has a history of known CAD with BMS to the LAD and balloon angioplasty of D1 in 2001>>s/p balloon angioplasty of D1 in 2002>> in-stent restenosis of the proximal LAD however FFR negative in 03/2016. Underwent a Myoview stress test in 2020 which was low risk with no ischemia.  Echocardiogram in 2018 with hyperdynamic LV function with grade 2 DD.    Had admission in 10/2020 where she presented with several day history of nausea, vomiting, diarrhea and poor oral intake with a 16 pound weight loss. On ED presentation, she was noted to be in atrial fibrillation with RVR.  Abdominal CT was suspicious for diverticulitis therefore antibiotics were initiated. Given new onset AF, she was started on Eliquis 5 mg twice daily for CHA2DS2-VASc score of 6.  If bleeding risk was felt to be low, plan was to consider continuing her ASA 81 mg given her history of POBA and BMS. Had follow-up cardiac monitor that showed several episodes of SVT but no Afib.  Saw [name] in 03/2021 where she was having palpitations but were less frequent than previously. Also was having atypical chest pain. Myoview 03/2021 was normal and low risk.   Was  seen in clinic on 10/2021. She was not feeling well due to arthritic pain, LE edema, palpitations, and neck pain/ear fullness.  She was noted to have enlarged left LN on  her carotid ultrasound. Follow-up ultrasound showed multiple small rounded hypoechoic structures in the left submandibular region which are atypical in appearance for lymph nodes. Further evaluation with contrast enhanced soft tissue neck CT was recommended which showed no significant findings.  Was last seen in clinic 01/2022 where she was stable from a CV standpoint. Was dealing with joint pain.  Today, the patient is complaining of significant LE edema and knee pain. She has been undergoing left knee injections which has not helped her symptoms. She recommended that they do the MRI which showed a stress fracture of the distal femoral metaphysis with marrow edema with degenerated medial meniscus and tear of the lateral meniscus. She continues to have significant pain and this has been severely limiting her mobility. Also with bad back pain and sciatica. In the setting of immobility, she has been noticing more LE edema. No orthopnea, PND or chest pain. Has occasional palpitations.   Past Medical History:  Diagnosis Date   CAD (coronary artery disease)    a. PTCA LAD 2001/cath 2002-prox and mid LAD stents patent, PTCA ostium Dx 1 b. cath 03/2016: patent LAD stent with less than 40% in-stent restenosis, 10-30% stenosis of D1 and distal LAD with continued medical management recommended. 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    Past Surgical History:  Procedure Laterality Date   CARDIAC CATHETERIZATION     with stent placement in 2001   CARDIAC CATHETERIZATION N/A 03/12/2016   Procedure: Left Heart Cath and Coronary Angiography;  Surgeon: Nelva Bush, MD;  Location: [place] [facility];  Service: Cardiovascular;  Laterality: N/A;   CARDIAC CATHETERIZATION N/A 03/12/2016   Procedure: Intravascular Pressure Wire/FFR Study;  Surgeon: Nelva Bush, MD;  Location: [place] [facility];  Service: Cardiovascular;  Laterality: N/A;   CHOLECYSTECTOMY     CORONARY ANGIOPLASTY     2002   ESOPHAGOGASTRODUODENOSCOPY N/A 12/03/2016   Procedure: ESOPHAGOGASTRODUODENOSCOPY (EGD);  Surgeon: Carol Ada, MD;  Location: [place];  Service: Endoscopy;  Laterality: N/A;   laproscopic adjustable gastric  banding     with APS standard system.    OPEN REDUCTION INTERNAL FIXATION (ORIF) DISTAL RADIAL FRACTURE Right 05/15/2018   Procedure: OPEN REDUCTION INTERNAL FIXATION (ORIF) DISTAL RADIAL FRACTURE;  Surgeon: Shona Needles, MD;  Location: [place];  Service: Orthopedics;  Laterality: Right;   ORIF ANKLE FRACTURE Right 05/15/2018   Procedure: OPEN REDUCTION INTERNAL FIXATION (ORIF) ANKLE FRACTURE;  Surgeon: Shona Needles, MD;  Location: [place];  Service: Orthopedics;  Laterality: Right;   ORIF TIBIA PLATEAU Right 05/15/2018   Procedure: OPEN REDUCTION INTERNAL FIXATION (ORIF) TIBIAL PLATEAU;  Surgeon: Shona Needles, MD;  Location: [place];  Service: Orthopedics;  Laterality: Right;   Pt has 3 stents     sept 2001   SHOULDER ARTHROSCOPY WITH SUBACROMIAL DECOMPRESSION, ROTATOR CUFF REPAIR AND BICEP TENDON REPAIR Right 11/16/2013   Procedure: RIGHT SHOULDER ARTHROSCOPY, EXTENSIVE DEBRIDEMENT, ROTATOR CUFF REPAIR ;  Surgeon: [name], MD;  Location: [place];  Service: Orthopedics;  Laterality: Right;   TUBAL LIGATION      Current  Medications: Current Meds  Medication Sig   acetaminophen (TYLENOL) 500 MG tablet Take 1,000 mg by mouth every 6 (six) hours as needed for headache (pain).   allopurinol (ZYLOPRIM) 100 MG tablet Take 400 mg by mouth at bedtime.   apixaban (ELIQUIS) 5 MG TABS tablet Take 1 tablet (5 mg total) by mouth 2 (two) times daily.   aspirin EC 81 MG tablet Take 81 mg by mouth at bedtime. Swallow whole.   atorvastatin (LIPITOR) 40 MG tablet Take 2 tablets (80 mg total) by mouth daily   bisacodyl (DULCOLAX) 5 MG EC tablet Take 5 mg by mouth at bedtime as needed for moderate constipation.   ezetimibe (ZETIA) 10 MG tablet Take 1 tablet (10 mg total) by mouth daily.   folic acid (FOLVITE) 1 MG tablet Take 1 mg by mouth at bedtime.   furosemide (LASIX) 20 MG tablet Take 3 tablets (60 mg total) by mouth in the morning, then take 2 tablets (40 mg total) by mouth in the afternoon, everyday.  hydroxychloroquine (PLAQUENIL) 200 MG tablet Take 200 mg by mouth at bedtime.   levocetirizine (XYZAL) 5 MG tablet Take 5 mg by mouth at bedtime.   lovastatin (MEVACOR) 40 MG tablet Take 40 mg by mouth daily. Per patient taking 2 tablets of 40 mg to equal 80 mg daily   Melatonin 3 MG TABS Take 3 mg by mouth at bedtime as needed (sleep).   methotrexate (RHEUMATREX) 2.5 MG tablet Take 10 mg by mouth every Sunday.   metoprolol tartrate (LOPRESSOR) 25 MG tablet TAKE 1 TABLET BY MOUTH  TWICE DAILY (TAKE ALONG  WITH ONE '50MG'$  TABLET FOR A  TOTAL OF '75MG'$  TWICE DAILY)   metoprolol tartrate (LOPRESSOR) 50 MG tablet TAKE 1 TABLET BY MOUTH  TWICE DAILY WITH '25MG'$   TABLET FOR A TOTAL DOSE OF  '75MG'$    nitroGLYCERIN (NITROSTAT) 0.4 MG SL tablet DISSOLVE 1 TABLET UNDER THE TONGUE EVERY 5 MINUTES AS  NEEDED FOR CHEST PAIN. MAX  OF 3 TABLETS IN 15 MINUTES. CALL 911 IF PAIN PERSISTS.   Omega-3 Fatty Acids (FISH OIL) 1000 MG CAPS Take 1,000 mg by mouth at bedtime.   oxyCODONE-acetaminophen (PERCOCET/ROXICET) 5-325 MG tablet Take 1 tablet by mouth  every 6 (six) hours as needed for severe pain.   Potassium Chloride ER 20 MEQ TBCR Take 10 mEq by mouth daily.   ramipril (ALTACE) 2.5 MG capsule Take 1 capsule (2.5 mg total) by mouth daily.   spironolactone (ALDACTONE) 25 MG tablet Take 1 tablet (25 mg total) by mouth daily.   tiZANidine (ZANAFLEX) 2 MG tablet Take 1 tablet (2 mg total) by mouth every 8 (eight) hours as needed for muscle spasms.   valACYclovir (VALTREX) 1000 MG tablet Take 4,000 mg by mouth once as needed (fever blisters).   zolpidem (AMBIEN) 5 MG tablet Take 5 mg by mouth at bedtime.   [DISCONTINUED] furosemide (LASIX) 40 MG tablet Take 1 tablet (40 mg total) by mouth 2 (two) times daily as needed for edema or fluid.     Allergies:   Codeine phosphate, Morphine and related, Amoxicillin, Penicillins, and Lidocaine   Social History   Socioeconomic History   Marital status: Married    Spouse name: Not on file   Number of children: Not on file   Years of education: Not on file   Highest education level: Not on file  Occupational History   Not on file  Tobacco Use   Smoking status: Never   Smokeless tobacco: Never  Vaping Use   Vaping Use: Never used  Substance and Sexual Activity   Alcohol use: No    Alcohol/week: 0.0 standard drinks of alcohol   Drug use: No   Sexual activity: Never  Other Topics Concern   Not on file  Social History Narrative   Not on file   Social Determinants of Health   Financial Resource Strain: Not on file  Food Insecurity: Not on file  Transportation Needs: Not on file  Physical Activity: Not on file  Stress: Not on file  Social Connections: Not on file     Family History: The patient's family history includes Arthritis/Rheumatoid in her father; Breast cancer (age of onset: 16) in her sister; Cancer in an other family member; Cirrhosis in her father; Colon cancer in her maternal grandmother; Diabetes in her brother and sister; Heart attack in her maternal aunt, maternal uncle,  mother, and another family member; Heart disease in her brother, brother, maternal aunt, and mother; Throat cancer in her maternal aunt and  maternal grandfather.  ROS:   Please see the history of present illness. (+) Palpitations (+) Shortness of Breath (+) Knee Discomfort (+) LE Edema All other systems are reviewed and negative.    EKGs/Labs/Other Studies Reviewed:    The following studies were reviewed today: Myoview 03/2021: The study is normal. The study is low risk.   No ST deviation was noted.   Left ventricular function is normal. Nuclear stress EF: 78 %. The left ventricular ejection fraction is hyperdynamic (>65%).   Prior study available for comparison from 05/22/2019. No changes compared to prior study.   No evidence of ischemia or previous myocardial infarction.  Carotid US 01/19/21 Bilateral ICA 1-39   Incidental findings: Enlarged cervical lymph node anterior to the left  proximal/mid segment of the internal carotid artery, measuring 1.5 x .96 x  .88 cm.    LONG TERM MONITOR 01/02/2021 Narrative  Patch wear time was 12 days and 11 hours  Predominant rhythm was NSR with average HR 70bpm (ranging from 50-174bpm)  There were 368 episodes of SVT with the longest lasting 16.2 seconds at average rate 120bpm; fastest was 13 beats at max rate of 174bpm  Occasional SVE (2.3%), rare PVCs (<1%)  No AFib, VT or significant pauses   Echocardiogram 10/27/20 EF 60-65, no RWMA, mild LVH, GRII DD, normal RVSF, RVSP 31.1, mild LAE, mild MR, trivial AI   Carotid US 05/22/2019 Bilateral ICA 1-39   Myoview 05/22/2019 EF 86, no ischemia or infarction, low risk   Renal artery [place] 01/27/2017 Normal renal arteries, bilaterally Normal caliber abdominal aorta   Echocardiogram 12/03/2016 EF 65-70, normal wall motion, GR 2 DD, mild LAE   Cardiac catheterization 03/12/2016 LM normal LAD proximal stent patent w 40 ISR (FFR 0.84), dist stent patent w 10 ISR; D1 ost 30 LCx patent   RCA patent  EF 55-65  EKG:   05/12/22: No new tracing 01/18/2022 EKG: No new tracing  10/19/2021 EKG: EKG was not ordered.  Recent Labs: 10/19/2021: Hemoglobin 11.7; NT-Pro BNP 614; Platelets 281 01/27/2022: BUN 17; Creatinine, Ser 0.80; Potassium 4.3; Sodium 135  Recent Lipid Panel    Component Value Date/Time   CHOL 155 10/19/2021 1043   TRIG 122 10/19/2021 1043   HDL 79 10/19/2021 1043   CHOLHDL 2.0 10/19/2021 1043   CHOLHDL 2.3 03/12/2016 0252   VLDL 30 03/12/2016 0252   LDLCALC 55 10/19/2021 1043     Risk Assessment/Calculations:    CHA2DS2-VASc Score = 5   This indicates a 7.2% annual risk of stroke. The patient's score is based upon: CHF History: 1 HTN History: 1 Diabetes History: 0 Stroke History: 0 Vascular Disease History: 1 Age Score: 1 Gender Score: 1          Physical Exam:    VS:  BP 128/74   Pulse 70   Ht '5\' 3"'$  (1.6 m)   Wt 225 lb (102.1 kg) Comment: pt reported unable to stand to weigh  SpO2 98%   BMI 39.86 kg/m     Wt Readings from Last 3 Encounters:  05/12/22 225 lb (102.1 kg)  03/15/22 225 lb (102.1 kg)  01/18/22 231 lb 3.2 oz (104.9 kg)     GEN: Comfortable, sitting in the wheelchair HEENT: Normal NECK: No JVD; No carotid bruits CARDIAC: RRR, no murmurs, rubs, gallops RESPIRATORY:  Faint crackles in left lung base otherwise clear ABDOMEN: Soft, non-tender, non-distended MUSCULOSKELETAL:  1+ pitting edema bilaterally, warm SKIN: Warm and dry NEUROLOGIC:  Alert and oriented x  3 PSYCHIATRIC:  Normal affect   ASSESSMENT:    1. Chronic diastolic CHF (congestive heart failure) ([place])   2. Medication management   3. Chronic heart failure with preserved ejection fraction ([place])   4. Edema, unspecified type   5. Bilateral lower extremity edema   6. Enlarged lymph node   7. Secondary hypercoagulability disorder ([place])   8. PAF (paroxysmal atrial fibrillation) ([place])   9. Essential hypertension   10. Mixed hyperlipidemia   11.  Coronary artery disease involving native coronary artery of native heart without angina pectoris     PLAN:    In order of problems listed above:  #Chronic Diastolic HF: TTE 62/9528: LVEF 60-65%, G2DD, nomral RV, mild MR, mild LAE, trivial AI. Currently with mildly worsened LE edema in the setting of decreased mobility and knee injury. Will trial increased dose of lasix and monitor response. -Increase lasix to '60mg'$  in AM and '40mg'$  in PM -Increase potassium to 66mq daily -Continue spironolactone '25mg'$  daily -No SGLT2i due to history of yeast infections/UTI -BMET next week  #Paroxysmal Afib: CHADs-vasc 6. Diagnosed in 10/2020 when she presented with diverticulitis. Currently overall well controlled with intermittent breakthrough episodes. Tolerating AC without issues. -Continue metop '50mg'$  in AM and '25mg'$  in PM with additional '25mg'$  prn for breakthrough palpitations -Continue apixaban '5mg'$  BID  #CAD s/p PCI to LAD with BMS and POBA to D1 x2: Last myoview 03/2021 low risk with no ischemia or infarction. -Continue metop '50mg'$  in AM and '25mg'$  in PM  -Continue ramipril 2.'5mg'$  daily -Continue lipitor '40mg'$  daily, zetia '10mg'$  daily -Continue ASA '81mg'$  daily  #HTN: Well controlled and at goal. -Continue metop '50mg'$  in AM and '25mg'$  in PM  -Continue ramipril 2.'5mg'$  daily -Continue spironolactone '25mg'$  daily  #HLD: -Continue lipitor '40mg'$  daily, zetia '10mg'$  daily  #Bilateral carotid artery stenosis: Minimal disease 1-39% bilaterally 01/2021. -Continue surveillance with repeat ultrasounds every 1-2 years -Continue ASA and statin as above  #SLE: #RA: On chronic immunosuppression. -Management per Rheum  #Enlarged LN: Reassuring ultrasound and CT neck.     Follow up in 6 months    Medication Adjustments/Labs and Tests Ordered: Current medicines are reviewed at length with the patient today.  Concerns regarding medicines are outlined above.  Orders Placed This Encounter  Procedures   Basic  metabolic panel   Pro b natriuretic peptide   Meds ordered this encounter  Medications   furosemide (LASIX) 20 MG tablet    Sig: Take 3 tablets (60 mg total) by mouth in the morning, then take 2 tablets (40 mg total) by mouth in the afternoon, everyday.    Dispense:  150 tablet    Refill:  1    Dose increase    Patient Instructions  Medication Instructions:   INCREASE YOUR LASIX TO TAKING 60 MG BY MOUTH IN THE MORNING THEN TAKE 40 MG BY MOUTH IN THE AFTERNOON, EVERYDAY  *If you need a refill on your cardiac medications before your next appointment, please call your pharmacy*   Lab Work:  [facility]-B[facility]  If you have labs (blood work) drawn today and your tests are completely normal, you will receive your results only by: [facility](if you have MyChart) OR A paper copy in the mail If you have any lab test that is abnormal or we need to change your treatment, we will call you to review the results.    Follow-Up:  ONE MONTH WITH AN EXTENDER IN THE OFFICE--PLEASE SCHEDULE  WITH SCOTT WEAVER PA-C OR ERNEST DICK NP   Important Information About Sugar

## 2022-05-12 ENCOUNTER — Ambulatory Visit: Payer: Medicare Other | Attending: Cardiology | Admitting: Cardiology

## 2022-05-12 ENCOUNTER — Encounter: Payer: Self-pay | Admitting: Cardiology

## 2022-05-12 VITALS — BP 128/74 | HR 70 | Ht 63.0 in | Wt 225.0 lb

## 2022-05-12 DIAGNOSIS — D6869 Other thrombophilia: Secondary | ICD-10-CM | POA: Diagnosis not present

## 2022-05-12 DIAGNOSIS — I2510 Atherosclerotic heart disease of native coronary artery without angina pectoris: Secondary | ICD-10-CM | POA: Insufficient documentation

## 2022-05-12 DIAGNOSIS — I480 Paroxysmal atrial fibrillation: Secondary | ICD-10-CM | POA: Diagnosis not present

## 2022-05-12 DIAGNOSIS — R609 Edema, unspecified: Secondary | ICD-10-CM | POA: Insufficient documentation

## 2022-05-12 DIAGNOSIS — Z79899 Other long term (current) drug therapy: Secondary | ICD-10-CM | POA: Insufficient documentation

## 2022-05-12 DIAGNOSIS — R600 Localized edema: Secondary | ICD-10-CM | POA: Diagnosis not present

## 2022-05-12 DIAGNOSIS — I10 Essential (primary) hypertension: Secondary | ICD-10-CM | POA: Diagnosis not present

## 2022-05-12 DIAGNOSIS — E782 Mixed hyperlipidemia: Secondary | ICD-10-CM | POA: Insufficient documentation

## 2022-05-12 DIAGNOSIS — I5032 Chronic diastolic (congestive) heart failure: Secondary | ICD-10-CM | POA: Diagnosis not present

## 2022-05-12 DIAGNOSIS — R599 Enlarged lymph nodes, unspecified: Secondary | ICD-10-CM | POA: Diagnosis not present

## 2022-05-12 MED ORDER — FUROSEMIDE 20 MG PO TABS: ORAL_TABLET | ORAL | 1 refills | Status: DC

## 2022-05-12 NOTE — Patient Instructions (Addendum)
Medication Instructions:   INCREASE YOUR LASIX TO TAKING 60 MG BY MOUTH IN THE MORNING THEN TAKE 40 MG BY MOUTH IN THE AFTERNOON, EVERYDAY  *If you need a refill on your cardiac medications before your next appointment, please call your pharmacy*   Lab Work:  [place] OFFICE--BMET AND PRO-BNP  If you have labs (blood work) drawn today and your tests are completely normal, you will receive your results only by: [place] (if you have MyChart) OR A paper copy in the mail If you have any lab test that is abnormal or we need to change your treatment, we will call you to review the results.    Follow-Up:  ONE MONTH WITH AN EXTENDER IN THE OFFICE--PLEASE SCHEDULE WITH SCOTT WEAVER PA-C OR ERNEST DICK NP   Important Information About Sugar

## 2022-05-19 ENCOUNTER — Ambulatory Visit: Payer: Medicare Other | Attending: Cardiology

## 2022-05-19 DIAGNOSIS — R600 Localized edema: Secondary | ICD-10-CM

## 2022-05-19 DIAGNOSIS — Z79899 Other long term (current) drug therapy: Secondary | ICD-10-CM

## 2022-05-19 DIAGNOSIS — R609 Edema, unspecified: Secondary | ICD-10-CM | POA: Diagnosis not present

## 2022-05-19 DIAGNOSIS — I5032 Chronic diastolic (congestive) heart failure: Secondary | ICD-10-CM | POA: Diagnosis not present

## 2022-05-20 LAB — BASIC METABOLIC PANEL
BUN/Creatinine Ratio: 19 (ref 12–28)
BUN: 15 mg/dL (ref 8–27)
CO2: 26 mmol/L (ref 20–29)
Calcium: 9.1 mg/dL (ref 8.7–10.3)
Chloride: 94 mmol/L — ABNORMAL LOW (ref 96–106)
Creatinine, Ser: 0.81 mg/dL (ref 0.57–1.00)
Glucose: 149 mg/dL — ABNORMAL HIGH (ref 70–99)
Potassium: 4.3 mmol/L (ref 3.5–5.2)
Sodium: 135 mmol/L (ref 134–144)
eGFR: 78 mL/min/{1.73_m2} (ref >59)

## 2022-05-20 LAB — PRO B NATRIURETIC PEPTIDE: NT-Pro BNP: 380 pg/mL — ABNORMAL HIGH (ref 0–301)

## 2022-05-25 ENCOUNTER — Other Ambulatory Visit: Payer: Self-pay | Admitting: Orthopedic Surgery

## 2022-05-25 DIAGNOSIS — M25562 Pain in left knee: Secondary | ICD-10-CM | POA: Diagnosis not present

## 2022-05-25 DIAGNOSIS — M545 Low back pain, unspecified: Secondary | ICD-10-CM

## 2022-05-28 DIAGNOSIS — R5383 Other fatigue: Secondary | ICD-10-CM | POA: Diagnosis not present

## 2022-05-28 DIAGNOSIS — E559 Vitamin D deficiency, unspecified: Secondary | ICD-10-CM | POA: Diagnosis not present

## 2022-05-28 DIAGNOSIS — M810 Age-related osteoporosis without current pathological fracture: Secondary | ICD-10-CM | POA: Diagnosis not present

## 2022-06-13 ENCOUNTER — Other Ambulatory Visit: Payer: Medicare Other

## 2022-06-14 ENCOUNTER — Ambulatory Visit: Payer: Medicare Other | Admitting: Nurse Practitioner

## 2022-06-15 DIAGNOSIS — M25561 Pain in right knee: Secondary | ICD-10-CM | POA: Diagnosis not present

## 2022-06-15 DIAGNOSIS — M25562 Pain in left knee: Secondary | ICD-10-CM | POA: Diagnosis not present

## 2022-06-23 DIAGNOSIS — Z780 Asymptomatic menopausal state: Secondary | ICD-10-CM | POA: Diagnosis not present

## 2022-06-23 DIAGNOSIS — M810 Age-related osteoporosis without current pathological fracture: Secondary | ICD-10-CM | POA: Diagnosis not present

## 2022-07-08 ENCOUNTER — Ambulatory Visit
Admission: RE | Admit: 2022-07-08 | Discharge: 2022-07-08 | Disposition: A | Discharge status: Home / Self Care | Payer: Medicare Other | Source: Ambulatory Visit | Attending: Orthopedic Surgery | Admitting: Orthopedic Surgery

## 2022-07-08 DIAGNOSIS — M4316 Spondylolisthesis, lumbar region: Secondary | ICD-10-CM | POA: Diagnosis not present

## 2022-07-08 DIAGNOSIS — M48061 Spinal stenosis, lumbar region without neurogenic claudication: Secondary | ICD-10-CM | POA: Diagnosis not present

## 2022-07-08 DIAGNOSIS — M545 Low back pain, unspecified: Secondary | ICD-10-CM

## 2022-07-14 ENCOUNTER — Encounter: Payer: Self-pay | Admitting: Cardiology

## 2022-07-22 DIAGNOSIS — M47817 Spondylosis without myelopathy or radiculopathy, lumbosacral region: Secondary | ICD-10-CM | POA: Diagnosis not present

## 2022-07-23 ENCOUNTER — Other Ambulatory Visit: Payer: Self-pay

## 2022-07-23 DIAGNOSIS — Z79899 Other long term (current) drug therapy: Secondary | ICD-10-CM

## 2022-07-23 DIAGNOSIS — R600 Localized edema: Secondary | ICD-10-CM

## 2022-07-23 DIAGNOSIS — R609 Edema, unspecified: Secondary | ICD-10-CM

## 2022-07-23 DIAGNOSIS — I5032 Chronic diastolic (congestive) heart failure: Secondary | ICD-10-CM

## 2022-07-23 MED ORDER — FUROSEMIDE 20 MG PO TABS: ORAL_TABLET | ORAL | 3 refills | Status: DC

## 2022-07-26 DIAGNOSIS — M810 Age-related osteoporosis without current pathological fracture: Secondary | ICD-10-CM | POA: Diagnosis not present

## 2022-07-28 ENCOUNTER — Ambulatory Visit: Payer: Medicare Other | Admitting: Cardiology

## 2022-08-03 ENCOUNTER — Other Ambulatory Visit: Payer: Self-pay | Admitting: *Deleted

## 2022-08-03 DIAGNOSIS — R609 Edema, unspecified: Secondary | ICD-10-CM

## 2022-08-03 DIAGNOSIS — R600 Localized edema: Secondary | ICD-10-CM

## 2022-08-03 DIAGNOSIS — Z79899 Other long term (current) drug therapy: Secondary | ICD-10-CM

## 2022-08-03 DIAGNOSIS — I5032 Chronic diastolic (congestive) heart failure: Secondary | ICD-10-CM

## 2022-08-03 MED ORDER — FUROSEMIDE 20 MG PO TABS: ORAL_TABLET | ORAL | 3 refills | Status: DC

## 2022-08-04 ENCOUNTER — Telehealth: Payer: Self-pay | Admitting: Cardiology

## 2022-08-04 NOTE — Telephone Encounter (Signed)
Made aware Dr. Johney Frame and  her nurse are not in  the office today, but will follow up once they return.  She is asking if she can have bone infusions for osteoporosis.  Says that there may be a concern w/ the infusions if you have afib, and she has hx of this abn rhythm.  Aware MD and/or her team will follow up with her when they return. Pt agreeable

## 2022-08-04 NOTE — Telephone Encounter (Signed)
Pt states she is scheduled to go have bone infusion due to osteopetrosis and is requesting a call back to discuss if this will be okay to have done.

## 2022-08-05 DIAGNOSIS — M810 Age-related osteoporosis without current pathological fracture: Secondary | ICD-10-CM | POA: Diagnosis not present

## 2022-08-05 DIAGNOSIS — E559 Vitamin D deficiency, unspecified: Secondary | ICD-10-CM | POA: Diagnosis not present

## 2022-08-05 DIAGNOSIS — R835 Abnormal microbiological findings in cerebrospinal fluid: Secondary | ICD-10-CM | POA: Diagnosis not present

## 2022-08-05 NOTE — Telephone Encounter (Signed)
Called the pt back.  Pt states she will be getting bone infusions soon, and is wandering if she will need cardiac/cardiac medication clearance prior to getting this procedure done.   Pt states the bone infusions aren't scheduled yet.   Advised the pt to call the office of the Provider who will be doing her upcoming bone infusions, and have them fax [place] a cardiac/medication clearance form to our office at [phone], so that our pre-op clearance team can safely advise on clearing her for this procedure from a cardiac/medication standpoint.  Pt wrote our fax number down to provide them and will call their office today and have them fax this request to our office.   Pt verbalized understanding and agrees with this plan.

## 2022-08-05 NOTE — Telephone Encounter (Deleted)
Printed labs off from [facility] and placed in medical records bag to have scanned into Dr. Johney Frame in-basket to further review and advise on.  Will route this message to Dr. Johney Frame to make her aware of incoming labs to be scanned into her basket, from the pts PCP.  Will follow-up with the pt on lab results once Dr. Johney Frame receives, reviews, and advises.

## 2022-08-05 NOTE — Telephone Encounter (Signed)
Called the pt and husband answered the phone and said she is still sleeping and to call back in the later part of the morning.   Will call the pt later on.

## 2022-08-10 ENCOUNTER — Other Ambulatory Visit: Payer: Self-pay

## 2022-08-10 DIAGNOSIS — M810 Age-related osteoporosis without current pathological fracture: Secondary | ICD-10-CM | POA: Insufficient documentation

## 2022-08-11 ENCOUNTER — Other Ambulatory Visit: Payer: Self-pay

## 2022-08-11 ENCOUNTER — Telehealth: Payer: Self-pay | Admitting: Pharmacy Technician

## 2022-08-11 ENCOUNTER — Other Ambulatory Visit (HOSPITAL_COMMUNITY): Payer: Self-pay

## 2022-08-11 NOTE — Telephone Encounter (Signed)
Auth Submission: NO AUTH NEEDED Payer: MEDIARE A/B Medication & CPT/J Code(s) submitted: Reclast (Zolendronic acid) P1898 Route of submission (phone, fax, portal):  Phone # Fax # Auth type: Buy/Bill Units/visits requested: 1 Reference number:  Approval from: 08/11/22 to 08/12/23

## 2022-08-13 ENCOUNTER — Telehealth: Payer: Self-pay | Admitting: Nurse Practitioner

## 2022-08-13 ENCOUNTER — Ambulatory Visit (HOSPITAL_COMMUNITY)
Admission: RE | Admit: 2022-08-13 | Discharge: 2022-08-13 | Disposition: A | Discharge status: Home / Self Care | Payer: Medicare Other | Source: Ambulatory Visit | Attending: Diagnostic Radiology | Admitting: Diagnostic Radiology

## 2022-08-13 ENCOUNTER — Ambulatory Visit
Admission: EM | Admit: 2022-08-13 | Discharge: 2022-08-13 | Disposition: A | Discharge status: Home / Self Care | Payer: Medicare Other | Attending: Nurse Practitioner | Admitting: Nurse Practitioner

## 2022-08-13 DIAGNOSIS — M4854XA Collapsed vertebra, not elsewhere classified, thoracic region, initial encounter for fracture: Secondary | ICD-10-CM | POA: Diagnosis not present

## 2022-08-13 DIAGNOSIS — R059 Cough, unspecified: Secondary | ICD-10-CM | POA: Diagnosis not present

## 2022-08-13 DIAGNOSIS — I517 Cardiomegaly: Secondary | ICD-10-CM | POA: Diagnosis not present

## 2022-08-13 DIAGNOSIS — J22 Unspecified acute lower respiratory infection: Secondary | ICD-10-CM

## 2022-08-13 DIAGNOSIS — R0989 Other specified symptoms and signs involving the circulatory and respiratory systems: Secondary | ICD-10-CM | POA: Diagnosis present

## 2022-08-13 DIAGNOSIS — Z8719 Personal history of other diseases of the digestive system: Secondary | ICD-10-CM

## 2022-08-13 DIAGNOSIS — R109 Unspecified abdominal pain: Secondary | ICD-10-CM

## 2022-08-13 MED ORDER — BENZONATATE 100 MG PO CAPS: 100.0000 mg | ORAL_CAPSULE | Freq: Three times a day (TID) | ORAL | 0 refills | Status: DC | PRN

## 2022-08-13 MED ORDER — MOXIFLOXACIN HCL 400 MG PO TABS: 400.0000 mg | ORAL_TABLET | Freq: Every day | ORAL | 0 refills | Status: AC

## 2022-08-13 NOTE — Discharge Instructions (Addendum)
Go to [facility] emergency department and let them know you are there for a chest x-ray.  They should direct you to the imaging department.  You will be contacted once the results are received. Lab results are pending.  You will be contacted once results are received if they are abnormal. Take medication as prescribed. Recommend a bland diet until your symptoms improve.  This includes bananas, rice, applesauce, toast, water, and ice chips. Avoid any salty, spicy, or greasy foods while your symptoms persist. Recommend using a humidifier in your bedroom at nighttime during sleep and sleeping elevated on pillows while cough symptoms persist. If you develop worsening cough with shortness of breath, difficulty breathing, or if you develop new symptoms such as fever, chills, or become unable to speak in a complete sentence, please go to the emergency department immediately. Recommend following up with your primary care physician within 1 week for reevaluation. Follow-up as needed.

## 2022-08-13 NOTE — ED Triage Notes (Signed)
Pt reports coughing, congestion, chest pain due to coughing that radiates to her back x 1 month and has  gas build up , and indigestion x 1 week. Some nausea and loss of appetite

## 2022-08-13 NOTE — Telephone Encounter (Signed)
Called patient regarding chest x-ray result.  Verified patient's name and date of birth.  Advised patient of x-ray results.  Patient was advised that the antibiotic moxifloxacin will cover her for pneumonia and for possible diverticulitis.  Advised patient that once the pending lab results are received, she will be contacted if they are abnormal.  Patient verbalizes understanding.  All questions were answered.  Patient advised to follow-up with her PCP as discussed.

## 2022-08-13 NOTE — ED Provider Notes (Signed)
[facility]    CSN: [id number] Arrival date & time: 08/13/22  1104      History   Chief Complaint No chief complaint on file.   HPI Cindy Holmes is a 72 y.o. female.   The history is provided by the spouse and the patient.   The patient presents with her spouse for complaints of cough and GI symptoms.  Patient states cough has been present for the past month.  She states over the last several days, cough has become productive.  She states that her chest feels like it is "burning" at times.  She states that she has difficulty lying flat, and is also noticed that she has been wheezing and has intermittent shortness of breath.  Patient denies fever, sore throat, ear pain, or vomiting.  Patient states that she also has chest pain due to the coughing.  She states that she has refrain from coughing because of the pain that it causes in her back.  Patient has a significant past medical history of musculoskeletal concerns.  With regard to her GI symptoms, patient states that she is experience bloating, nausea, and had episodes of diarrhea.  Patient states that she has a history of diverticulitis, and her symptoms feel similar.  She states that she has not had any fever, chills, vomiting, or constipation.  She states that the bloating is making her abdomen feel worse.  She states that she does follow-up with her PCP, but she called them and they were not in the office today.  She states that her PCP does normally prescribe antibiotics for her symptoms.  She states her last episode of diverticulitis was several years ago.  She has not taken any medication for her symptoms. Past Medical History:  Diagnosis Date   CAD (coronary artery disease)    a. PTCA LAD 2001/cath 2002-prox and mid LAD stents patent, PTCA ostium Dx 1 b. cath 03/2016: patent LAD stent with less than 40% in-stent restenosis, 10-30% stenosis of D1 and distal LAD with continued medical management recommended. 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    Patient Active Problem List   Diagnosis Date Noted   Senile osteoporosis 08/10/2022   Diverticulitis 10/26/2020   Atrial fibrillation with RVR ([place]) 10/26/2020   Acute diverticulitis 10/26/2020   Nausea vomiting and diarrhea    Closed displaced trimalleolar fracture of right ankle 05/28/2018   Fall at home, initial encounter 05/17/2018   Multiple fractures 05/14/2018   Fracture of distal end of right radius 05/14/2018   Tibial plateau fracture, right, closed, initial encounter 05/14/2018   Candida esophagitis ([place]) 12/03/2016   Hypokalemia 12/02/2016   Lactic acidosis 12/02/2016   AKI (acute kidney injury) ([place]) 12/02/2016  Chronic diastolic CHF (congestive heart failure) ([place]) 12/02/2016   Coronary artery disease involving native coronary artery of native heart without angina pectoris 11/13/2015   Chest pain 11/13/2015   Hyperlipemia 11/13/2015   Acute on chronic diastolic CHF (congestive heart failure), NYHA class 3 ([place]) 11/13/2015   Pain in joint, shoulder region 01/16/2014   Muscle weakness (generalized) 01/16/2014   Decreased range of motion of right shoulder 01/16/2014   Right rotator cuff tear 11/16/2013   Rotator cuff tear 11/16/2013   Rheumatoid arthritis ([place])    Essential  hypertension    Hyperlipidemia    Stroke [facility])    Emotional stress reaction    Leg pain    Carotid artery disease ([place])    Overweight 11/04/2008   EDEMA 11/04/2008    Past Surgical History:  Procedure Laterality Date   CARDIAC CATHETERIZATION     with stent placement in 2001   CARDIAC CATHETERIZATION N/A 03/12/2016   Procedure: Left Heart Cath and Coronary Angiography;  Surgeon: Nelva Bush, MD;  Location: [place] [facility];  Service: Cardiovascular;  Laterality: N/A;   CARDIAC CATHETERIZATION N/A 03/12/2016   Procedure: Intravascular Pressure Wire/FFR Study;  Surgeon: Nelva Bush, MD;  Location: [place] [facility];  Service: Cardiovascular;  Laterality: N/A;   CHOLECYSTECTOMY     CORONARY ANGIOPLASTY     2002   ESOPHAGOGASTRODUODENOSCOPY N/A 12/03/2016   Procedure: ESOPHAGOGASTRODUODENOSCOPY (EGD);  Surgeon: Carol Ada, MD;  Location: [place];  Service: Endoscopy;  Laterality: N/A;   laproscopic adjustable gastric  banding     with APS standard system.    OPEN REDUCTION INTERNAL FIXATION (ORIF) DISTAL RADIAL FRACTURE Right 05/15/2018   Procedure: OPEN REDUCTION INTERNAL FIXATION (ORIF) DISTAL RADIAL FRACTURE;  Surgeon: Shona Needles, MD;  Location: [place];  Service: Orthopedics;  Laterality: Right;   ORIF ANKLE FRACTURE Right 05/15/2018   Procedure: OPEN REDUCTION INTERNAL FIXATION (ORIF) ANKLE FRACTURE;  Surgeon: Shona Needles, MD;  Location: [place];  Service: Orthopedics;  Laterality: Right;   ORIF TIBIA PLATEAU Right 05/15/2018   Procedure: OPEN REDUCTION INTERNAL FIXATION (ORIF) TIBIAL PLATEAU;  Surgeon: Shona Needles, MD;  Location: [place];  Service: Orthopedics;  Laterality: Right;   Pt has 3 stents     sept 2001   SHOULDER ARTHROSCOPY WITH SUBACROMIAL DECOMPRESSION, ROTATOR CUFF REPAIR AND BICEP TENDON REPAIR Right 11/16/2013   Procedure: RIGHT SHOULDER ARTHROSCOPY, EXTENSIVE DEBRIDEMENT, ROTATOR CUFF REPAIR ;  Surgeon: [name], MD;  Location: [place];  Service: Orthopedics;  Laterality: Right;   TUBAL LIGATION      OB History   No obstetric history on file.      Home Medications    Prior to Admission medications   Medication Sig Start Date End Date Taking? Authorizing Provider  benzonatate (TESSALON PERLES) 100 MG capsule Take 1 capsule (100 mg total) by mouth 3 (three) times daily as needed for cough. 08/13/22  Yes Kejuan Bekker-Warren, Alda Lea, NP  moxifloxacin (AVELOX) 400 MG tablet Take 1 tablet (400 mg total) by mouth daily at 8 pm for 5 days. 08/13/22 08/18/22 Yes Raelie Lohr-Warren, Alda Lea, NP  acetaminophen (TYLENOL) 500 MG tablet Take 1,000 mg by mouth every 6 (six) hours as needed for headache (pain).    [provider]  allopurinol (ZYLOPRIM) 100 MG tablet Take 400 mg by mouth at bedtime.    [provider]  apixaban (ELIQUIS) 5 MG TABS tablet Take 1 tablet (5 mg total) by mouth 2 (two) times  daily. 12/10/20   Freada Bergeron, MD  aspirin EC 81 MG tablet Take 81 mg by mouth at bedtime. Swallow whole.    [provider]  atorvastatin (LIPITOR) 40 MG tablet Take 2 tablets (80 mg total) by mouth daily 12/10/20   Tommie Raymond, NP  bisacodyl (DULCOLAX) 5 MG EC tablet Take 5 mg by mouth at bedtime as needed for moderate constipation.    [provider]  diphenhydramine-acetaminophen (TYLENOL PM) 25-500 MG TABS tablet Take 1 tablet by mouth at bedtime. 10/28/20 01/18/22  George Hugh, MD  ezetimibe (ZETIA) 10 MG tablet Take 1 tablet (10 mg total) by mouth daily. 12/10/20   Kathyrn Drown D, NP  folic acid (FOLVITE) 1 MG tablet Take 1 mg by mouth at bedtime. 03/05/14   [provider]  furosemide (LASIX) 20 MG tablet Take 3 tablets (60 mg total) by mouth in the morning, then take 2 tablets (40 mg total) by mouth in the afternoon, everyday. 08/03/22   Freada Bergeron, MD  hydroxychloroquine (PLAQUENIL) 200 MG tablet Take 200 mg by mouth at bedtime. 09/03/20   [provider]   levocetirizine (XYZAL) 5 MG tablet Take 5 mg by mouth at bedtime. 04/04/18   [provider]  lovastatin (MEVACOR) 40 MG tablet Take 40 mg by mouth daily. Per patient taking 2 tablets of 40 mg to equal 80 mg daily 12/24/21   [provider]  Melatonin 3 MG TABS Take 3 mg by mouth at bedtime as needed (sleep).    [provider]  methotrexate (RHEUMATREX) 2.5 MG tablet Take 10 mg by mouth every Sunday. 08/08/20   [provider]  metoprolol tartrate (LOPRESSOR) 25 MG tablet TAKE 1 TABLET BY MOUTH  TWICE DAILY (TAKE ALONG  WITH ONE '50MG'$  TABLET FOR A  TOTAL OF '75MG'$  TWICE DAILY) 02/17/22   [name] T, PA-C  metoprolol tartrate (LOPRESSOR) 50 MG tablet TAKE 1 TABLET BY MOUTH  TWICE DAILY WITH '25MG'$   TABLET FOR A TOTAL DOSE OF  '75MG'$  02/17/22   Weaver, Scott T, PA-C  nitroGLYCERIN (NITROSTAT) 0.4 MG SL tablet DISSOLVE 1 TABLET UNDER THE TONGUE EVERY 5 MINUTES AS  NEEDED FOR CHEST PAIN. MAX  OF 3 TABLETS IN 15 MINUTES. CALL 911 IF PAIN PERSISTS. 05/13/21   [name] T, PA-C  Omega-3 Fatty Acids (FISH OIL) 1000 MG CAPS Take 1,000 mg by mouth at bedtime.    [provider]  oxyCODONE-acetaminophen (PERCOCET/ROXICET) 5-325 MG tablet Take 1 tablet by mouth every 6 (six) hours as needed for severe pain. 03/16/22   Orpah Greek, MD  Potassium Chloride ER 20 MEQ TBCR Take 10 mEq by mouth daily. 12/10/20   Kathyrn Drown D, NP  ramipril (ALTACE) 2.5 MG capsule Take 1 capsule (2.5 mg total) by mouth daily. 12/10/20   Kathyrn Drown D, NP  spironolactone (ALDACTONE) 25 MG tablet Take 1 tablet (25 mg total) by mouth daily. 01/18/22   Freada Bergeron, MD  tiZANidine (ZANAFLEX) 2 MG tablet Take 1 tablet (2 mg total) by mouth every 8 (eight) hours as needed for muscle spasms. 05/04/22   Araseli Sherry-Warren, Alda Lea, NP  valACYclovir (VALTREX) 1000 MG tablet Take 4,000 mg by mouth once as needed (fever blisters). 10/13/20   [provider]  zolpidem (AMBIEN) 5 MG  tablet Take 5 mg by mouth at bedtime. 10/13/20   [provider]    Family History Family History  Problem Relation Age of Onset   Heart attack  Mother    Heart disease Mother    Arthritis/Rheumatoid Father    Cirrhosis Father        hepatic cirrhosis   Breast cancer Sister 73   Diabetes Sister    Heart disease Maternal Aunt    Heart attack Maternal Aunt    Throat cancer Maternal Aunt    Heart attack Maternal Uncle    Colon cancer Maternal Grandmother    Throat cancer Maternal Grandfather    Diabetes Brother    Heart disease Brother    Heart disease Brother    Cancer Other        family history   Heart attack Other        family history    Social History Social History   Tobacco Use   Smoking status: Never   Smokeless tobacco: Never  Vaping Use   Vaping Use: Never used  Substance Use Topics   Alcohol use: No    Alcohol/week: 0.0 standard drinks of alcohol   Drug use: No     Allergies   Codeine phosphate, Morphine and related, Amoxicillin, Penicillins, and Lidocaine   Review of Systems Review of Systems Per HPI  Physical Exam Triage Vital Signs ED Triage Vitals  Enc Vitals Group     BP 08/13/22 1203 118/79     Pulse Rate 08/13/22 1203 64     Resp 08/13/22 1203 20     Temp 08/13/22 1203 97.7 F (36.5 C)     Temp Source 08/13/22 1203 Oral     SpO2 08/13/22 1203 99 %     Weight --      Height --      Head Circumference --      Peak Flow --      Pain Score 08/13/22 1205 5     Pain Loc --      Pain Edu? --      Excl. in [place]? --    No data found.  Updated Vital Signs BP 118/79 (BP Location: Left Arm)   Pulse 64   Temp 97.7 F (36.5 C) (Oral)   Resp 20   SpO2 99%   Visual Acuity Right Eye Distance:   Left Eye Distance:   Bilateral Distance:    Right Eye Near:   Left Eye Near:    Bilateral Near:     Physical Exam Vitals and nursing note reviewed.  Constitutional:      General: She is not in acute distress.    Appearance: Normal  appearance. She is well-developed.  HENT:     Head: Normocephalic and atraumatic.     Right Ear: Tympanic membrane, ear canal and external ear normal.     Left Ear: Tympanic membrane, ear canal and external ear normal.     Nose: Nose normal.     Mouth/Throat:     Lips: Pink.     Mouth: Mucous membranes are moist.     Pharynx: Oropharynx is clear. Uvula midline. Posterior oropharyngeal erythema present. No pharyngeal swelling or oropharyngeal exudate.  Eyes:     Extraocular Movements: Extraocular movements intact.     Conjunctiva/sclera: Conjunctivae normal.     Pupils: Pupils are equal, round, and reactive to light.  Cardiovascular:     Rate and Rhythm: Normal rate and regular rhythm.     Pulses: Normal pulses.     Heart sounds: Normal heart sounds. No murmur heard. Pulmonary:     Effort: Pulmonary effort is normal. No respiratory distress.  Breath sounds: Normal breath sounds. No stridor. No wheezing, rhonchi or rales.  Abdominal:     General: Bowel sounds are normal.     Palpations: Abdomen is soft.     Tenderness: There is no abdominal tenderness.  Musculoskeletal:        General: No swelling.     Cervical back: Normal range of motion.  Lymphadenopathy:     Cervical: No cervical adenopathy.  Skin:    General: Skin is warm and dry.     Capillary Refill: Capillary refill takes less than 2 seconds.  Neurological:     General: No focal deficit present.     Mental Status: She is alert and oriented to person, place, and time.  Psychiatric:        Mood and Affect: Mood normal.        Behavior: Behavior normal.      UC Treatments / Results  Labs (all labs ordered are listed, but only abnormal results are displayed) Labs Reviewed  CBC WITH DIFFERENTIAL/PLATELET  COMPREHENSIVE METABOLIC PANEL  LIPASE    EKG   Radiology DG Chest 2 View  Result Date: 08/13/2022 CLINICAL DATA:  Cough for 1 month, congestion EXAM: CHEST - 2 VIEW COMPARISON:  10/25/2020 FINDINGS:  Cardiomegaly. Diffuse bilateral interstitial pulmonary opacity. Age indeterminate wedge deformity of the lower thoracic spine, approximately T9. IMPRESSION: 1. Cardiomegaly with diffuse bilateral interstitial pulmonary opacity, consistent with edema or atypical/viral infection. No focal airspace opacity. 2. Age indeterminate wedge deformity of the lower thoracic spine, approximately T9. Correlate for acute pain and point tenderness. Electronically Signed   By: Delanna Ahmadi M.D.   On: 08/13/2022 14:03    Procedures Procedures (including critical care time)  Medications Ordered in UC Medications - No data to display  Initial Impression / Assessment and Plan / UC Course  I have reviewed the triage vital signs and the nursing notes.  Pertinent labs & imaging results that were available during my care of the patient were reviewed by me and considered in my medical decision making (see chart for details).  The patient is well-appearing, she is in no acute distress, vital signs are stable.  Cannot rule out pneumonia at this time, nor diverticulitis.  Will treat patient empirically with moxifloxacin 400 mg daily for pneumonia and diverticulitis.  Tessalon Perles 100 mg was also prescribed for her cough.  Chest x-ray is pending as patient will go to [facility] to have the procedure performed as imaging is unavailable in this clinic today.  CBC, CMP, and lipase are pending at this time.  Patient advised she will be contacted if the pending test results are abnormal.  Supportive care recommendations were provided to the patient to include a brat diet.  Patient advised to use a humidifier in his bedroom at nighttime during sleep and sleeping elevated on pillows as needed.  Strict indications to follow-up in the emergency department were also provided to the patient.  Patient was advised to follow-up with her PCP within the next week.  Patient verbalizes understanding.  All questions were answered.  Patient  stable for discharge.  Final Clinical Impressions(s) / UC Diagnoses   Final diagnoses:  Abdominal pain, unspecified abdominal location  History of diverticulitis  Lower respiratory infection     Discharge Instructions      Go to [facility] emergency department and let them know you are there for a chest x-ray.  They should direct you to the imaging department.  You will be contacted  once the results are received. Lab results are pending.  You will be contacted once results are received if they are abnormal. Take medication as prescribed. Recommend a bland diet until your symptoms improve.  This includes bananas, rice, applesauce, toast, water, and ice chips. Avoid any salty, spicy, or greasy foods while your symptoms persist. Recommend using a humidifier in your bedroom at nighttime during sleep and sleeping elevated on pillows while cough symptoms persist. If you develop worsening cough with shortness of breath, difficulty breathing, or if you develop new symptoms such as fever, chills, or become unable to speak in a complete sentence, please go to the emergency department immediately. Recommend following up with your primary care physician within 1 week for reevaluation. Follow-up as needed.     ED Prescriptions     Medication Sig Dispense Auth. Provider   moxifloxacin (AVELOX) 400 MG tablet Take 1 tablet (400 mg total) by mouth daily at 8 pm for 5 days. 5 tablet Adonys Wildes-Warren, Alda Lea, NP   benzonatate (TESSALON PERLES) 100 MG capsule Take 1 capsule (100 mg total) by mouth 3 (three) times daily as needed for cough. 30 capsule Careli Luzader-Warren, Alda Lea, NP      PDMP not reviewed this encounter.   Tish Men, NP 08/13/22 1623

## 2022-08-14 LAB — CBC WITH DIFFERENTIAL/PLATELET
Basophils Absolute: 0.1 10*3/uL (ref 0.0–0.2)
Basos: 1 %
EOS (ABSOLUTE): 0.1 10*3/uL (ref 0.0–0.4)
Eos: 1 %
Hematocrit: 37.2 % (ref 34.0–46.6)
Hemoglobin: 12.2 g/dL (ref 11.1–15.9)
Immature Grans (Abs): 0.1 10*3/uL (ref 0.0–0.1)
Immature Granulocytes: 1 %
Lymphocytes Absolute: 1.1 10*3/uL (ref 0.7–3.1)
Lymphs: 8 %
MCH: 30.4 pg (ref 26.6–33.0)
MCHC: 32.8 g/dL (ref 31.5–35.7)
MCV: 93 fL (ref 79–97)
Monocytes Absolute: 0.7 10*3/uL (ref 0.1–0.9)
Monocytes: 5 %
Neutrophils Absolute: 12.1 10*3/uL — ABNORMAL HIGH (ref 1.4–7.0)
Neutrophils: 84 %
Platelets: 290 10*3/uL (ref 150–450)
RBC: 4.01 x10E6/uL (ref 3.77–5.28)
RDW: 14.4 % (ref 11.7–15.4)
WBC: 14.1 10*3/uL — ABNORMAL HIGH (ref 3.4–10.8)

## 2022-08-14 LAB — COMPREHENSIVE METABOLIC PANEL
ALT: 16 IU/L (ref 0–32)
AST: 11 IU/L (ref 0–40)
Albumin/Globulin Ratio: 2.3 — ABNORMAL HIGH (ref 1.2–2.2)
Albumin: 4.4 g/dL (ref 3.8–4.8)
Alkaline Phosphatase: 92 IU/L (ref 44–121)
BUN/Creatinine Ratio: 20 (ref 12–28)
BUN: 19 mg/dL (ref 8–27)
Bilirubin Total: 0.5 mg/dL (ref 0.0–1.2)
CO2: 22 mmol/L (ref 20–29)
Calcium: 10 mg/dL (ref 8.7–10.3)
Chloride: 97 mmol/L (ref 96–106)
Creatinine, Ser: 0.95 mg/dL (ref 0.57–1.00)
Globulin, Total: 1.9 g/dL (ref 1.5–4.5)
Glucose: 141 mg/dL — ABNORMAL HIGH (ref 70–99)
Potassium: 4.7 mmol/L (ref 3.5–5.2)
Sodium: 141 mmol/L (ref 134–144)
Total Protein: 6.3 g/dL (ref 6.0–8.5)
eGFR: 64 mL/min/{1.73_m2} (ref >59)

## 2022-08-14 LAB — LIPASE: Lipase: 41 U/L (ref 14–85)

## 2022-08-16 ENCOUNTER — Encounter (HOSPITAL_COMMUNITY)
Admission: RE | Admit: 2022-08-16 | Discharge: 2022-08-16 | Disposition: A | Discharge status: Home / Self Care | Payer: Medicare Other | Source: Ambulatory Visit | Attending: Sports Medicine | Admitting: Sports Medicine

## 2022-08-16 DIAGNOSIS — M859 Disorder of bone density and structure, unspecified: Secondary | ICD-10-CM | POA: Insufficient documentation

## 2022-08-16 DIAGNOSIS — I2510 Atherosclerotic heart disease of native coronary artery without angina pectoris: Secondary | ICD-10-CM | POA: Insufficient documentation

## 2022-08-16 DIAGNOSIS — M329 Systemic lupus erythematosus, unspecified: Secondary | ICD-10-CM | POA: Insufficient documentation

## 2022-08-16 DIAGNOSIS — M549 Dorsalgia, unspecified: Secondary | ICD-10-CM | POA: Insufficient documentation

## 2022-08-16 DIAGNOSIS — I110 Hypertensive heart disease with heart failure: Secondary | ICD-10-CM | POA: Insufficient documentation

## 2022-08-16 DIAGNOSIS — Z87440 Personal history of urinary (tract) infections: Secondary | ICD-10-CM | POA: Insufficient documentation

## 2022-08-16 DIAGNOSIS — Z7901 Long term (current) use of anticoagulants: Secondary | ICD-10-CM | POA: Insufficient documentation

## 2022-08-16 DIAGNOSIS — M069 Rheumatoid arthritis, unspecified: Secondary | ICD-10-CM | POA: Insufficient documentation

## 2022-08-16 DIAGNOSIS — I480 Paroxysmal atrial fibrillation: Secondary | ICD-10-CM | POA: Insufficient documentation

## 2022-08-16 DIAGNOSIS — G8929 Other chronic pain: Secondary | ICD-10-CM | POA: Insufficient documentation

## 2022-08-16 DIAGNOSIS — D6869 Other thrombophilia: Secondary | ICD-10-CM | POA: Insufficient documentation

## 2022-08-16 DIAGNOSIS — E782 Mixed hyperlipidemia: Secondary | ICD-10-CM | POA: Insufficient documentation

## 2022-08-16 DIAGNOSIS — R002 Palpitations: Secondary | ICD-10-CM | POA: Insufficient documentation

## 2022-08-16 DIAGNOSIS — I5032 Chronic diastolic (congestive) heart failure: Secondary | ICD-10-CM | POA: Insufficient documentation

## 2022-08-16 DIAGNOSIS — Z7982 Long term (current) use of aspirin: Secondary | ICD-10-CM | POA: Insufficient documentation

## 2022-08-16 DIAGNOSIS — Z7960 Long term (current) use of unspecified immunomodulators and immunosuppressants: Secondary | ICD-10-CM | POA: Insufficient documentation

## 2022-08-16 DIAGNOSIS — Z79899 Other long term (current) drug therapy: Secondary | ICD-10-CM | POA: Insufficient documentation

## 2022-08-16 DIAGNOSIS — Z955 Presence of coronary angioplasty implant and graft: Secondary | ICD-10-CM | POA: Insufficient documentation

## 2022-08-16 DIAGNOSIS — R0602 Shortness of breath: Secondary | ICD-10-CM | POA: Insufficient documentation

## 2022-08-16 DIAGNOSIS — R062 Wheezing: Secondary | ICD-10-CM | POA: Insufficient documentation

## 2022-08-16 DIAGNOSIS — I6523 Occlusion and stenosis of bilateral carotid arteries: Secondary | ICD-10-CM | POA: Insufficient documentation

## 2022-08-16 DIAGNOSIS — D84821 Immunodeficiency due to drugs: Secondary | ICD-10-CM | POA: Insufficient documentation

## 2022-08-17 DIAGNOSIS — J189 Pneumonia, unspecified organism: Secondary | ICD-10-CM | POA: Diagnosis not present

## 2022-08-17 DIAGNOSIS — G894 Chronic pain syndrome: Secondary | ICD-10-CM | POA: Diagnosis not present

## 2022-08-17 DIAGNOSIS — I4891 Unspecified atrial fibrillation: Secondary | ICD-10-CM | POA: Diagnosis not present

## 2022-08-17 DIAGNOSIS — M0579 Rheumatoid arthritis with rheumatoid factor of multiple sites without organ or systems involvement: Secondary | ICD-10-CM | POA: Diagnosis not present

## 2022-08-21 NOTE — Progress Notes (Unsigned)
Cardiology Office Note:    Date:  08/25/2022   ID:  Cindy, Holmes [birth year], MRN [id number]  PCP:  Redmond School, MD   [facility] Providers Cardiologist:  Freada Bergeron, MD {   Referring MD: Redmond School, MD    History of Present Illness:    Cindy Holmes is a 72 y.o. female with a hx of CAD s/p BMS to LAD and balloon angioplasty of D1 in 2001 and repeat balloon angioplasty to D1 in 123XX123, diastolic HF, RA, lupus on chronic immunosuppression, HTN, prior CVA and Afib who was previously followed by Dr. Meda Coffee who now presents to clinic for follow-up.  Per review of the record, the patient has a history of known CAD with BMS to the LAD and balloon angioplasty of D1 in 2001>>s/p balloon angioplasty of D1 in 2002>> in-stent restenosis of the proximal LAD however FFR negative in 03/2016. Underwent a Myoview stress test in 2020 which was low risk with no ischemia.  Echocardiogram in 2018 with hyperdynamic LV function with grade 2 DD.    Had admission in 10/2020 where she presented with several day history of nausea, vomiting, diarrhea and poor oral intake with a 16 pound weight loss. On ED presentation, she was noted to be in atrial fibrillation with RVR.  Abdominal CT was suspicious for diverticulitis therefore antibiotics were initiated. Given new onset AF, she was started on Eliquis 5 mg twice daily for CHA2DS2-VASc score of 6.  If bleeding risk was felt to be low, plan was to consider continuing her ASA 81 mg given her history of POBA and BMS. Had follow-up cardiac monitor that showed several episodes of SVT but no Afib.  Saw [name] in 03/2021 where she was having palpitations but were less frequent than previously. Also was having atypical chest pain. Myoview 03/2021 was normal and low risk.   Was seen in clinic on 10/2021. She was not feeling well due to arthritic pain, LE edema, palpitations, and neck pain/ear fullness.  She was noted to have enlarged left LN on  her carotid ultrasound. Follow-up ultrasound showed multiple small rounded hypoechoic structures in the left submandibular region which are atypical in appearance for lymph nodes. Further evaluation with contrast enhanced soft tissue neck CT was recommended which showed no significant findings.  Was last seen in clinic 05/2022 where she was struggling with arthritic pain. Her lasix was increased for LE edema. BNP was 380.  Today, the patient states that she has been told she cannot have knee surgery due to concern of her overall low bone density that she may not tolerate it. She also has been having a lot of chronic back pain with disc degeneration and nerve compression. Also with recent viral pneumonia from which she is recovering.   Otherwise, stable from a CV standpoint. No chest pain, orthopnea, or PND. Continues to have intermittent wheezing and SOB from her pneumonia. Has chronic LE edema which is slightly worse today. She is going to increase her lasix dosing. Continues to have intermittent palpitations, which worsened with the treatment of her viral pneumonia.    Past Medical History:  Diagnosis Date   CAD (coronary artery disease)    a. PTCA LAD 2001/cath 2002-prox and mid LAD stents patent, PTCA ostium Dx 1 b. cath 03/2016: patent LAD stent with less than 40% in-stent restenosis, 10-30% stenosis of D1 and distal LAD with continued medical management recommended. 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    Past Surgical History:  Procedure Laterality Date   CARDIAC CATHETERIZATION     with stent placement in 2001   CARDIAC CATHETERIZATION N/A 03/12/2016   Procedure: Left Heart Cath and Coronary Angiography;  Surgeon: Nelva Bush, MD;  Location: [place] [facility];  Service: Cardiovascular;  Laterality: N/A;   CARDIAC CATHETERIZATION N/A 03/12/2016   Procedure: Intravascular Pressure Wire/FFR Study;  Surgeon: Nelva Bush, MD;  Location: [place] [facility];  Service: Cardiovascular;  Laterality: N/A;   CHOLECYSTECTOMY     CORONARY ANGIOPLASTY     2002   ESOPHAGOGASTRODUODENOSCOPY N/A 12/03/2016   Procedure: ESOPHAGOGASTRODUODENOSCOPY (EGD);  Surgeon: Carol Ada, MD;  Location: [place];  Service: Endoscopy;  Laterality: N/A;   laproscopic adjustable gastric  banding     with APS standard system.    OPEN REDUCTION INTERNAL FIXATION (ORIF) DISTAL RADIAL FRACTURE Right 05/15/2018   Procedure: OPEN REDUCTION INTERNAL FIXATION (ORIF) DISTAL RADIAL FRACTURE;  Surgeon: Shona Needles, MD;  Location: [place];  Service: Orthopedics;  Laterality: Right;   ORIF ANKLE FRACTURE Right 05/15/2018   Procedure: OPEN REDUCTION INTERNAL FIXATION (ORIF) ANKLE FRACTURE;  Surgeon: Shona Needles, MD;  Location: [place];  Service: Orthopedics;  Laterality: Right;   ORIF TIBIA PLATEAU Right 05/15/2018   Procedure: OPEN REDUCTION INTERNAL FIXATION (ORIF) TIBIAL PLATEAU;  Surgeon: Shona Needles, MD;  Location: [place];  Service: Orthopedics;  Laterality: Right;   Pt has 3 stents     sept 2001   SHOULDER ARTHROSCOPY WITH SUBACROMIAL DECOMPRESSION, ROTATOR CUFF REPAIR AND BICEP TENDON REPAIR Right 11/16/2013   Procedure: RIGHT SHOULDER ARTHROSCOPY, EXTENSIVE DEBRIDEMENT, ROTATOR CUFF REPAIR ;  Surgeon: [name], MD;  Location: [place];  Service: Orthopedics;  Laterality: Right;   TUBAL LIGATION      Current Medications: Current Meds  Medication Sig   acetaminophen (TYLENOL) 500 MG tablet Take 1,000 mg by mouth every 6 (six) hours as needed for headache (pain).   allopurinol (ZYLOPRIM) 100 MG tablet Take 400 mg by mouth at bedtime.   apixaban (ELIQUIS) 5 MG TABS tablet Take 1 tablet (5 mg total) by mouth 2 (two) times daily.   aspirin EC 81 MG tablet Take 81 mg by mouth at bedtime. Swallow whole.   atorvastatin (LIPITOR) 40 MG tablet Take 2 tablets (80 mg total) by mouth daily   benzonatate (TESSALON PERLES) 100 MG capsule Take 1 capsule (100 mg total) by mouth 3 (three) times daily as needed for cough.   bisacodyl (DULCOLAX) 5 MG EC tablet Take 5 mg by mouth at bedtime as needed for moderate constipation.   ezetimibe (ZETIA) 10 MG tablet Take 1 tablet (10 mg total) by mouth daily.   folic acid (FOLVITE)  1 MG tablet Take 1 mg by mouth at bedtime.   hydroxychloroquine (PLAQUENIL) 200 MG tablet Take 200 mg by mouth at bedtime.   levocetirizine (XYZAL) 5 MG tablet Take 5 mg by mouth at bedtime.   lovastatin (MEVACOR) 40 MG tablet Take 40 mg by mouth daily. Per patient taking 2 tablets of 40 mg to equal 80 mg daily   Melatonin 3 MG TABS Take 3 mg by mouth at bedtime as needed (sleep).   methotrexate (RHEUMATREX) 2.5 MG tablet Take 10 mg by mouth every Sunday.   metoprolol tartrate (LOPRESSOR) 25 MG tablet TAKE 1 TABLET BY MOUTH  TWICE DAILY (TAKE ALONG  WITH ONE 50MG TABLET FOR A  TOTAL OF 75MG TWICE DAILY)   metoprolol tartrate (LOPRESSOR) 50 MG tablet TAKE 1 TABLET BY MOUTH  TWICE DAILY WITH 25MG  TABLET FOR A TOTAL DOSE OF  75MG   nitroGLYCERIN (NITROSTAT) 0.4 MG SL tablet DISSOLVE 1 TABLET UNDER THE TONGUE EVERY 5 MINUTES AS  NEEDED FOR CHEST PAIN. MAX  OF 3 TABLETS IN 15 MINUTES. CALL 911 IF PAIN PERSISTS.   Omega-3 Fatty Acids (FISH OIL) 1000 MG CAPS Take 1,000 mg by mouth at bedtime.    oxyCODONE-acetaminophen (PERCOCET/ROXICET) 5-325 MG tablet Take 1 tablet by mouth every 6 (six) hours as needed for severe pain.   Potassium Chloride ER 20 MEQ TBCR Take 10 mEq by mouth daily.   ramipril (ALTACE) 2.5 MG capsule Take 1 capsule (2.5 mg total) by mouth daily.   spironolactone (ALDACTONE) 25 MG tablet Take 1 tablet (25 mg total) by mouth daily.   tiZANidine (ZANAFLEX) 2 MG tablet Take 1 tablet (2 mg total) by mouth every 8 (eight) hours as needed for muscle spasms.   valACYclovir (VALTREX) 1000 MG tablet Take 4,000 mg by mouth once as needed (fever blisters).   zolpidem (AMBIEN) 5 MG tablet Take 5 mg by mouth at bedtime.   [DISCONTINUED] furosemide (LASIX) 20 MG tablet Take 3 tablets (60 mg total) by mouth in the morning, then take 2 tablets (40 mg total) by mouth in the afternoon, everyday.     Allergies:   Codeine phosphate, Morphine and related, Amoxicillin, Penicillins, and Lidocaine   Social History   Socioeconomic History   Marital status: Married    Spouse name: Not on file   Number of children: Not on file   Years of education: Not on file   Highest education level: Not on file  Occupational History   Not on file  Tobacco Use   Smoking status: Never   Smokeless tobacco: Never  Vaping Use   Vaping Use: Never used  Substance and Sexual Activity   Alcohol use: No    Alcohol/week: 0.0 standard drinks of alcohol   Drug use: No   Sexual activity: Never  Other Topics Concern   Not on file  Social History Narrative   Not on file   Social Determinants of Health   Financial Resource Strain: Not on file  Food Insecurity: Not on file  Transportation Needs: Not on file  Physical Activity: Not on file  Stress: Not on file  Social Connections: Not on file     Family History: The patient's family history includes Arthritis/Rheumatoid in her father; Breast cancer (age of onset: 65) in her sister; Cancer in an other family member; Cirrhosis in her father; Colon  cancer in her maternal grandmother; Diabetes in her brother and sister; Heart attack in her maternal aunt, maternal uncle, mother, and another family  member; Heart disease in her brother, brother, maternal aunt, and mother; Throat cancer in her maternal aunt and maternal grandfather.  ROS:   Please see the history of present illness. All other systems are reviewed and negative.    EKGs/Labs/Other Studies Reviewed:    The following studies were reviewed today: Myoview 03/2021: The study is normal. The study is low risk.   No ST deviation was noted.   Left ventricular function is normal. Nuclear stress EF: 78 %. The left ventricular ejection fraction is hyperdynamic (>65%).   Prior study available for comparison from 05/22/2019. No changes compared to prior study.   No evidence of ischemia or previous myocardial infarction.  Carotid US 01/19/21 Bilateral ICA 1-39   Incidental findings: Enlarged cervical lymph node anterior to the left  proximal/mid segment of the internal carotid artery, measuring 1.5 x .96 x  .88 cm.    LONG TERM MONITOR 01/02/2021 Narrative  Patch wear time was 12 days and 11 hours  Predominant rhythm was NSR with average HR 70bpm (ranging from 50-174bpm)  There were 368 episodes of SVT with the longest lasting 16.2 seconds at average rate 120bpm; fastest was 13 beats at max rate of 174bpm  Occasional SVE (2.3%), rare PVCs (<1%)  No AFib, VT or significant pauses   Echocardiogram 10/27/20 EF 60-65, no RWMA, mild LVH, GRII DD, normal RVSF, RVSP 31.1, mild LAE, mild MR, trivial AI   Carotid US 05/22/2019 Bilateral ICA 1-39   Myoview 05/22/2019 EF 86, no ischemia or infarction, low risk   Renal artery [place] 01/27/2017 Normal renal arteries, bilaterally Normal caliber abdominal aorta   Echocardiogram 12/03/2016 EF 65-70, normal wall motion, GR 2 DD, mild LAE   Cardiac catheterization 03/12/2016 LM normal LAD proximal stent patent w 40 ISR (FFR 0.84), dist  stent patent w 10 ISR; D1 ost 30 LCx patent  RCA patent  EF 55-65  EKG:   08/25/22: NSR with nonspecific ST-T wave changes  Recent Labs: 05/19/2022: NT-Pro BNP 380 08/13/2022: ALT 16; BUN 19; Creatinine, Ser 0.95; Hemoglobin 12.2; Platelets 290; Potassium 4.7; Sodium 141  Recent Lipid Panel    Component Value Date/Time   CHOL 155 10/19/2021 1043   TRIG 122 10/19/2021 1043   HDL 79 10/19/2021 1043   CHOLHDL 2.0 10/19/2021 1043   CHOLHDL 2.3 03/12/2016 0252   VLDL 30 03/12/2016 0252   LDLCALC 55 10/19/2021 1043     Risk Assessment/Calculations:    CHA2DS2-VASc Score = 5   This indicates a 7.2% annual risk of stroke. The patient's score is based upon: CHF History: 1 HTN History: 1 Diabetes History: 0 Stroke History: 0 Vascular Disease History: 1 Age Score: 1 Gender Score: 1          Physical Exam:    VS:  BP 131/69   Pulse 63   Ht 5' 3"$  (1.6 m)   Wt 225 lb (102.1 kg)   SpO2 98%   BMI 39.86 kg/m     Wt Readings from Last 3 Encounters:  08/25/22 225 lb (102.1 kg)  05/12/22 225 lb (102.1 kg)  03/15/22 225 lb (102.1 kg)     GEN: Comfortable, sitting in the wheelchair HEENT: Normal NECK: No JVD; No carotid bruits CARDIAC: RRR, no murmurs, rubs, gallops RESPIRATORY:  Faint crackles in left lung base otherwise clear ABDOMEN: Soft, non-tender, non-distended MUSCULOSKELETAL:  1+ pitting edema bilaterally, warm SKIN: Warm and dry NEUROLOGIC:  Alert and oriented x 3 PSYCHIATRIC:  Normal affect   ASSESSMENT:  1. Chronic heart failure with preserved ejection fraction ([place])   2. PAF (paroxysmal atrial fibrillation) ([place])   3. Essential hypertension   4. Secondary hypercoagulability disorder ([place])   5. Mixed hyperlipidemia   6. Coronary artery disease involving native coronary artery of native heart without angina pectoris   7. Bilateral carotid artery stenosis   8. Medication management   9. Bilateral lower extremity edema   10. Chronic diastolic CHF  (congestive heart failure) ([place])   11. Edema, unspecified type     PLAN:    In order of problems listed above:  #Chronic Diastolic HF: TTE 123XX123: LVEF 60-65%, G2DD, nomral RV, mild MR, mild LAE, trivial AI. Currently, overloaded on exam with 2+ LE edema. -Continue lasix 75m in AM and 412min PM with extra 2055mt night until LE edema resolves -Continue potassium 40m24maily -Continue spironolactone 25mg29mly -No SGLT2i due to history of yeast infections/UTI  #Paroxysmal Afib: CHADs-vasc 6. Diagnosed in 10/2020 when she presented with diverticulitis. Currently overall well controlled with intermittent breakthrough episodes. Tolerating AC without issues. -Continue metop 50mg 16mM and 25mg i75m with additional 25mg pr49mr breakthrough palpitations -Continue apixaban 5mg BID 53mAD s/p PCI to LAD with BMS and POBA to D1 x2: Last myoview 03/2021 low risk with no ischemia or infarction. -Continue metop 50mg in A36md 25mg in PM37montinue ramipril 2.5mg daily -31mtinue lipitor 40mg daily, 67ma 10mg daily -C64mnue ASA 81mg daily  #H70mWell controlled and at goal. -Continue metop 50mg in AM and 12m in PM  -Con106me ramipril 2.5mg daily -Contin30mspironolactone 25mg daily  #HLD: 75mtinue lipitor 40mg daily, zetia 139mdaily -LDL 62; 73m023   #Bilateral carotid artery stenosis: Minimal disease 1-39% bilaterally 01/2021. -Check carotid ultrasound 01/2023 for monitoring -Continue ASA and statin as above  #SLE: #RA: On chronic immunosuppression. -Management per Rheum  #Enlarged LN: Reassuring ultrasound and CT neck.     Follow up in 6 months    Medication Adjustments/Labs and Tests Ordered: Current medicines are reviewed at length with the patient today.  Concerns regarding medicines are outlined above.  Orders Placed This Encounter  Procedures   HgB A1c   EKG 12-Lead   VAS US CAROTID   Meds ordKoreaed this encounter  Medications   furosemide (LASIX) 20 MG  tablet    Sig: Take 3 tablets (60 mg total) by mouth in the morning, then take 2 tablets (40 mg total) by mouth in the afternoon, everyday.    Dispense:  450 tablet    Refill:  3    Dose increase    Patient Instructions  Medication Instructions:   Your physician recommends that you continue on your current medications as directed. Please refer to the Current Medication list given to you today.  *If you need a refill on your cardiac medications before your next appointment, please call your pharmacy*   Lab Work:  TODAY--A1C  If [facility]) drawn today and your tests are completely normal, you will receive your results only by: [place] ([place]) OR A paper copy in the mail If you have any lab test that is abnormal or we need to change your treatment, we will call you to review the results.   Testing/Procedures:  Your physician has requested that you have a carotid duplex. This test is an ultrasound of the carotid arteries in your neck. It looks at blood flow through these arteries that supply the brain with  blood. Allow one hour for this exam. There are no restrictions or special instructions.  SCHEDULE CAROTIDS TO BE DONE IN JULY 2024 PER DR. Johney Frame     Follow-Up:  3 MONTHS WITH AN EXTENDER IN THE OFFICE--PLEASE SCHEDULE WITH AN EXTENDER PER DR. Johney Frame

## 2022-08-25 ENCOUNTER — Encounter: Payer: Self-pay | Admitting: Cardiology

## 2022-08-25 ENCOUNTER — Encounter (INDEPENDENT_AMBULATORY_CARE_PROVIDER_SITE_OTHER): Payer: Medicare Other | Admitting: Cardiology

## 2022-08-25 VITALS — BP 131/69 | HR 63 | Ht 63.0 in | Wt 225.0 lb

## 2022-08-25 DIAGNOSIS — I110 Hypertensive heart disease with heart failure: Secondary | ICD-10-CM | POA: Diagnosis not present

## 2022-08-25 DIAGNOSIS — M329 Systemic lupus erythematosus, unspecified: Secondary | ICD-10-CM | POA: Diagnosis not present

## 2022-08-25 DIAGNOSIS — M859 Disorder of bone density and structure, unspecified: Secondary | ICD-10-CM | POA: Diagnosis not present

## 2022-08-25 DIAGNOSIS — D6869 Other thrombophilia: Secondary | ICD-10-CM | POA: Diagnosis not present

## 2022-08-25 DIAGNOSIS — Z7982 Long term (current) use of aspirin: Secondary | ICD-10-CM | POA: Diagnosis not present

## 2022-08-25 DIAGNOSIS — M069 Rheumatoid arthritis, unspecified: Secondary | ICD-10-CM | POA: Diagnosis not present

## 2022-08-25 DIAGNOSIS — I480 Paroxysmal atrial fibrillation: Secondary | ICD-10-CM | POA: Diagnosis not present

## 2022-08-25 DIAGNOSIS — I6523 Occlusion and stenosis of bilateral carotid arteries: Secondary | ICD-10-CM

## 2022-08-25 DIAGNOSIS — Z79899 Other long term (current) drug therapy: Secondary | ICD-10-CM | POA: Diagnosis not present

## 2022-08-25 DIAGNOSIS — R062 Wheezing: Secondary | ICD-10-CM | POA: Diagnosis not present

## 2022-08-25 DIAGNOSIS — R609 Edema, unspecified: Secondary | ICD-10-CM | POA: Diagnosis not present

## 2022-08-25 DIAGNOSIS — M549 Dorsalgia, unspecified: Secondary | ICD-10-CM | POA: Diagnosis not present

## 2022-08-25 DIAGNOSIS — I10 Essential (primary) hypertension: Secondary | ICD-10-CM | POA: Diagnosis not present

## 2022-08-25 DIAGNOSIS — I2510 Atherosclerotic heart disease of native coronary artery without angina pectoris: Secondary | ICD-10-CM | POA: Diagnosis not present

## 2022-08-25 DIAGNOSIS — E782 Mixed hyperlipidemia: Secondary | ICD-10-CM

## 2022-08-25 DIAGNOSIS — I5032 Chronic diastolic (congestive) heart failure: Secondary | ICD-10-CM

## 2022-08-25 DIAGNOSIS — G8929 Other chronic pain: Secondary | ICD-10-CM | POA: Diagnosis not present

## 2022-08-25 DIAGNOSIS — Z7901 Long term (current) use of anticoagulants: Secondary | ICD-10-CM | POA: Diagnosis not present

## 2022-08-25 DIAGNOSIS — R600 Localized edema: Secondary | ICD-10-CM

## 2022-08-25 DIAGNOSIS — R002 Palpitations: Secondary | ICD-10-CM | POA: Diagnosis not present

## 2022-08-25 DIAGNOSIS — Z87440 Personal history of urinary (tract) infections: Secondary | ICD-10-CM | POA: Diagnosis not present

## 2022-08-25 DIAGNOSIS — R0602 Shortness of breath: Secondary | ICD-10-CM | POA: Diagnosis not present

## 2022-08-25 DIAGNOSIS — Z7960 Long term (current) use of unspecified immunomodulators and immunosuppressants: Secondary | ICD-10-CM | POA: Diagnosis not present

## 2022-08-25 DIAGNOSIS — D84821 Immunodeficiency due to drugs: Secondary | ICD-10-CM | POA: Diagnosis not present

## 2022-08-25 DIAGNOSIS — Z955 Presence of coronary angioplasty implant and graft: Secondary | ICD-10-CM | POA: Diagnosis not present

## 2022-08-25 MED ORDER — FUROSEMIDE 20 MG PO TABS: ORAL_TABLET | ORAL | 3 refills | Status: DC

## 2022-08-25 NOTE — Patient Instructions (Signed)
Medication Instructions:   Your physician recommends that you continue on your current medications as directed. Please refer to the Current Medication list given to you today.  *If you need a refill on your cardiac medications before your next appointment, please call your pharmacy*   Lab Work:  [facility]  If you have labs (blood work) drawn today and your tests are completely normal, you will receive your results only by: [place] (if you have MyChart) OR A paper copy in the mail If you have any lab test that is abnormal or we need to change your treatment, we will call you to review the results.   Testing/Procedures:  Your physician has requested that you have a carotid duplex. This test is an ultrasound of the carotid arteries in your neck. It looks at blood flow through these arteries that supply the brain with blood. Allow one hour for this exam. There are no restrictions or special instructions.  SCHEDULE CAROTIDS TO BE DONE IN JULY 2024 PER DR. Johney Frame     Follow-Up:  3 MONTHS WITH AN EXTENDER IN THE OFFICE--PLEASE SCHEDULE WITH AN EXTENDER PER DR. Johney Frame

## 2022-08-26 ENCOUNTER — Encounter (HOSPITAL_COMMUNITY)
Admission: RE | Admit: 2022-08-26 | Discharge: 2022-08-26 | Disposition: A | Discharge status: Home / Self Care | Payer: Medicare Other | Source: Ambulatory Visit | Attending: Sports Medicine | Admitting: Sports Medicine

## 2022-08-26 VITALS — BP 144/66 | HR 64 | Temp 98.3°F | Resp 18

## 2022-08-26 DIAGNOSIS — M810 Age-related osteoporosis without current pathological fracture: Secondary | ICD-10-CM

## 2022-08-26 DIAGNOSIS — I6523 Occlusion and stenosis of bilateral carotid arteries: Secondary | ICD-10-CM | POA: Diagnosis not present

## 2022-08-26 DIAGNOSIS — I5032 Chronic diastolic (congestive) heart failure: Secondary | ICD-10-CM | POA: Diagnosis not present

## 2022-08-26 DIAGNOSIS — I110 Hypertensive heart disease with heart failure: Secondary | ICD-10-CM | POA: Diagnosis not present

## 2022-08-26 DIAGNOSIS — M069 Rheumatoid arthritis, unspecified: Secondary | ICD-10-CM | POA: Diagnosis not present

## 2022-08-26 DIAGNOSIS — I480 Paroxysmal atrial fibrillation: Secondary | ICD-10-CM | POA: Diagnosis not present

## 2022-08-26 DIAGNOSIS — I2510 Atherosclerotic heart disease of native coronary artery without angina pectoris: Secondary | ICD-10-CM | POA: Diagnosis not present

## 2022-08-26 LAB — HEMOGLOBIN A1C
Est. average glucose Bld gHb Est-mCnc: 126 mg/dL
Hgb A1c MFr Bld: 6 % — ABNORMAL HIGH (ref 4.8–5.6)

## 2022-08-26 MED ORDER — ZOLEDRONIC ACID 5 MG/100ML IV SOLN
5.0000 mg | Freq: Once | INTRAVENOUS | Status: AC
  Administered 2022-08-26: 5 mg via INTRAVENOUS
  Filled 2022-08-26: qty 100

## 2022-08-26 MED ORDER — ACETAMINOPHEN 325 MG PO TABS
650.0000 mg | ORAL_TABLET | Freq: Once | ORAL | Status: AC
  Administered 2022-08-26: 650 mg via ORAL

## 2022-08-26 MED ORDER — DIPHENHYDRAMINE HCL 25 MG PO CAPS
25.0000 mg | ORAL_CAPSULE | Freq: Once | ORAL | Status: AC
  Administered 2022-08-26: 25 mg via ORAL

## 2022-08-26 NOTE — Progress Notes (Signed)
Diagnosis: Osteoporosis  Provider:  Wandra Feinstein MD  Procedure: Infusion  IV Type: Peripheral, IV Location: R Antecubital  Reclast (Zolendronic Acid), Dose: 5 mg  Infusion Start Time: [id number]  Infusion Stop Time: 1218  Post Infusion IV Care: Patient declined observation and Peripheral IV Discontinued  Discharge: Condition: Good, Destination: Home . AVS Provided  Performed by:  Binnie Kand, RN

## 2022-09-01 ENCOUNTER — Encounter (HOSPITAL_COMMUNITY): Payer: Medicare Other

## 2022-09-13 ENCOUNTER — Encounter (HOSPITAL_COMMUNITY): Payer: Medicare Other

## 2022-09-15 ENCOUNTER — Ambulatory Visit
Admission: RE | Admit: 2022-09-15 | Discharge: 2022-09-15 | Disposition: A | Discharge status: Home / Self Care | Payer: Medicare Other | Source: Ambulatory Visit | Attending: Nurse Practitioner | Admitting: Nurse Practitioner

## 2022-09-15 VITALS — BP 146/78 | HR 66 | Temp 98.0°F | Resp 18

## 2022-09-15 DIAGNOSIS — W548XXA Other contact with dog, initial encounter: Secondary | ICD-10-CM

## 2022-09-15 DIAGNOSIS — S81802A Unspecified open wound, left lower leg, initial encounter: Secondary | ICD-10-CM | POA: Diagnosis not present

## 2022-09-15 MED ORDER — MOXIFLOXACIN HCL 400 MG PO TABS: 400.0000 mg | ORAL_TABLET | Freq: Every day | ORAL | 0 refills | Status: DC

## 2022-09-15 NOTE — Discharge Instructions (Addendum)
Please stop using hydrogen peroxide on your wounds.  You can use mild soap and water to clean it twice daily, then apply a pressure dressing twice daily.  Start the antibiotic that I sent to the pharmacy to help with any possible infection in the skin around the dog scratch.  Please follow-up with your primary care provider if symptoms persist or worsen despite treatment.

## 2022-09-15 NOTE — ED Provider Notes (Signed)
[facility]    CSN: [id number] Arrival date & time: 09/15/22  [id number]      History   Chief Complaint Chief Complaint  Patient presents with   Leg Injury    Entered by patient   Appointment    0930    HPI Cindy Holmes is a 72 y.o. female.   Patient presents today with husband for wound to her left lower extremity for the past week that will not quit bleeding.  Reports she was holding her small dog and her dog jumped down and accidentally scratched her leg.  No drainage from the wound, fever, nausea/vomiting.  She does report the leg is more tender to touch than normal and then the right leg.  No decreased sensation, numbness or tingling in the toes.  Has been putting peroxide on the wound twice daily and applying a bandage which she bleeds through daily.    Past Medical History:  Diagnosis Date   CAD (coronary artery disease)    a. PTCA LAD 2001/cath 2002-prox and mid LAD stents patent, PTCA ostium Dx 1 b. cath 03/2016: patent LAD stent with less than 40% in-stent restenosis, 10-30% stenosis of D1 and distal LAD with continued medical management recommended. 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    Patient Active Problem List   Diagnosis Date Noted   Senile osteoporosis 08/10/2022   Diverticulitis 10/26/2020    Atrial fibrillation with RVR ([place]) 10/26/2020   Acute diverticulitis 10/26/2020   Nausea vomiting and diarrhea    Closed displaced trimalleolar fracture of right ankle 05/28/2018   Fall at home, initial encounter 05/17/2018   Multiple fractures 05/14/2018   Fracture of distal end of right radius 05/14/2018   Tibial plateau fracture, right, closed, initial encounter 05/14/2018   Candida esophagitis ([place]) 12/03/2016   Hypokalemia 12/02/2016   Lactic acidosis 12/02/2016   AKI (acute kidney injury) ([place]) 12/02/2016   Chronic diastolic CHF (congestive heart failure) ([place]) 12/02/2016   Coronary artery disease involving native coronary artery of native heart without angina pectoris 11/13/2015   Chest pain 11/13/2015   Hyperlipemia 11/13/2015   Acute on chronic diastolic CHF (congestive heart failure), NYHA class 3 ([place]) 11/13/2015   Pain in joint, shoulder region 01/16/2014   Muscle weakness (generalized) 01/16/2014   Decreased range of motion of right shoulder 01/16/2014   Right rotator cuff tear 11/16/2013   Rotator cuff tear 11/16/2013   Rheumatoid arthritis ([place])    Essential hypertension    Hyperlipidemia    Stroke [facility])    Emotional stress reaction    Leg pain    Carotid artery disease ([place])    Overweight 11/04/2008   EDEMA 11/04/2008    Past Surgical History:  Procedure Laterality Date   CARDIAC CATHETERIZATION     with stent placement in 2001   CARDIAC CATHETERIZATION N/A 03/12/2016   Procedure: Left Heart Cath and Coronary Angiography;  Surgeon: Nelva Bush, MD;  Location: [place] [facility];  Service: Cardiovascular;  Laterality: N/A;   CARDIAC CATHETERIZATION N/A 03/12/2016   Procedure: Intravascular Pressure Wire/FFR Study;  Surgeon: Nelva Bush, MD;  Location: [place] [facility];  Service: Cardiovascular;  Laterality: N/A;   CHOLECYSTECTOMY     CORONARY ANGIOPLASTY     2002   ESOPHAGOGASTRODUODENOSCOPY N/A 12/03/2016   Procedure: ESOPHAGOGASTRODUODENOSCOPY (EGD);   Surgeon: Carol Ada, MD;  Location: [place];  Service: Endoscopy;  Laterality: N/A;   laproscopic adjustable gastric  banding     with APS standard system.    OPEN REDUCTION INTERNAL FIXATION (ORIF) DISTAL RADIAL FRACTURE Right 05/15/2018   Procedure: OPEN REDUCTION INTERNAL FIXATION (ORIF) DISTAL RADIAL FRACTURE;  Surgeon: Shona Needles, MD;  Location: [place];  Service: Orthopedics;  Laterality: Right;   ORIF ANKLE FRACTURE Right 05/15/2018   Procedure: OPEN REDUCTION INTERNAL FIXATION (ORIF) ANKLE FRACTURE;  Surgeon: Shona Needles, MD;  Location: [place];  Service: Orthopedics;  Laterality: Right;   ORIF TIBIA PLATEAU Right 05/15/2018   Procedure: OPEN REDUCTION INTERNAL FIXATION (ORIF) TIBIAL PLATEAU;  Surgeon: Shona Needles, MD;  Location: [place];  Service: Orthopedics;  Laterality: Right;   Pt has 3 stents     sept 2001   SHOULDER ARTHROSCOPY WITH SUBACROMIAL DECOMPRESSION, ROTATOR CUFF REPAIR AND BICEP TENDON REPAIR Right 11/16/2013   Procedure: RIGHT SHOULDER ARTHROSCOPY, EXTENSIVE DEBRIDEMENT, ROTATOR CUFF REPAIR ;  Surgeon: [name], MD;  Location: [place];  Service: Orthopedics;  Laterality: Right;   TUBAL LIGATION      OB History   No obstetric history on file.      Home Medications    Prior to Admission medications   Medication Sig Start Date End Date Taking? Authorizing Provider  moxifloxacin (AVELOX) 400 MG tablet Take 1 tablet (400 mg total) by mouth daily at 8 pm. 09/15/22  Yes Eulogio Bear, NP  acetaminophen (TYLENOL) 500 MG tablet Take 1,000 mg by mouth every 6 (six) hours as needed for headache (pain).    [provider]  allopurinol (ZYLOPRIM) 100 MG tablet Take 400 mg by mouth at bedtime.    [provider]  apixaban (ELIQUIS) 5 MG TABS tablet Take 1 tablet (5 mg total) by mouth 2 (two) times daily. 12/10/20   Freada Bergeron, MD  aspirin EC 81 MG tablet Take 81 mg by mouth at bedtime. Swallow whole.     [provider]  atorvastatin (LIPITOR) 40 MG tablet Take 2 tablets (80 mg total) by mouth daily 12/10/20   Tommie Raymond, NP  benzonatate (TESSALON PERLES) 100 MG capsule Take 1 capsule (100 mg total) by mouth 3 (three) times daily as needed for cough. 08/13/22   Leath-Warren, Alda Lea, NP  bisacodyl (DULCOLAX) 5 MG EC tablet Take 5 mg by mouth at bedtime as needed for moderate constipation.    [provider]  diphenhydramine-acetaminophen (TYLENOL PM) 25-500 MG TABS tablet Take 1 tablet by mouth at bedtime. 10/28/20 01/18/22  George Hugh, MD  ezetimibe (ZETIA) 10 MG tablet Take 1 tablet (10 mg total) by mouth daily. 12/10/20   Kathyrn Drown D, NP  folic acid (FOLVITE) 1 MG tablet Take 1 mg by mouth at bedtime. 03/05/14   [provider]  furosemide (  LASIX) 20 MG tablet Take 3 tablets (60 mg total) by mouth in the morning, then take 2 tablets (40 mg total) by mouth in the afternoon, everyday. 08/25/22   Freada Bergeron, MD  hydroxychloroquine (PLAQUENIL) 200 MG tablet Take 200 mg by mouth at bedtime. 09/03/20   [provider]  levocetirizine (XYZAL) 5 MG tablet Take 5 mg by mouth at bedtime. 04/04/18   [provider]  lovastatin (MEVACOR) 40 MG tablet Take 40 mg by mouth daily. Per patient taking 2 tablets of 40 mg to equal 80 mg daily 12/24/21   [provider]  Melatonin 3 MG TABS Take 3 mg by mouth at bedtime as needed (sleep).    [provider]  methotrexate (RHEUMATREX) 2.5 MG tablet Take 10 mg by mouth every Sunday. 08/08/20   [provider]  metoprolol tartrate (LOPRESSOR) 25 MG tablet TAKE 1 TABLET BY MOUTH  TWICE DAILY (TAKE ALONG  WITH ONE '50MG'$  TABLET FOR A  TOTAL OF '75MG'$  TWICE DAILY) 02/17/22   [name] T, PA-C  metoprolol tartrate (LOPRESSOR) 50 MG tablet TAKE 1 TABLET BY MOUTH  TWICE DAILY WITH '25MG'$   TABLET FOR A TOTAL DOSE OF  '75MG'$  02/17/22   Weaver, Scott T, PA-C  nitroGLYCERIN (NITROSTAT) 0.4 MG SL tablet  DISSOLVE 1 TABLET UNDER THE TONGUE EVERY 5 MINUTES AS  NEEDED FOR CHEST PAIN. MAX  OF 3 TABLETS IN 15 MINUTES. CALL 911 IF PAIN PERSISTS. 05/13/21   [name] T, PA-C  Omega-3 Fatty Acids (FISH OIL) 1000 MG CAPS Take 1,000 mg by mouth at bedtime.    [provider]  oxyCODONE-acetaminophen (PERCOCET/ROXICET) 5-325 MG tablet Take 1 tablet by mouth every 6 (six) hours as needed for severe pain. 03/16/22   Orpah Greek, MD  Potassium Chloride ER 20 MEQ TBCR Take 10 mEq by mouth daily. 12/10/20   Kathyrn Drown D, NP  ramipril (ALTACE) 2.5 MG capsule Take 1 capsule (2.5 mg total) by mouth daily. 12/10/20   Kathyrn Drown D, NP  spironolactone (ALDACTONE) 25 MG tablet Take 1 tablet (25 mg total) by mouth daily. 01/18/22   Freada Bergeron, MD  tiZANidine (ZANAFLEX) 2 MG tablet Take 1 tablet (2 mg total) by mouth every 8 (eight) hours as needed for muscle spasms. 05/04/22   Leath-Warren, Alda Lea, NP  valACYclovir (VALTREX) 1000 MG tablet Take 4,000 mg by mouth once as needed (fever blisters). 10/13/20   [provider]  zolpidem (AMBIEN) 5 MG tablet Take 5 mg by mouth at bedtime. 10/13/20   [provider]    Family History Family History  Problem Relation Age of Onset   Heart attack Mother    Heart disease Mother    Arthritis/Rheumatoid Father    Cirrhosis Father        hepatic cirrhosis   Breast cancer Sister 66   Diabetes Sister    Heart disease Maternal Aunt    Heart attack Maternal Aunt    Throat cancer Maternal Aunt    Heart attack Maternal Uncle    Colon cancer Maternal Grandmother    Throat cancer Maternal Grandfather    Diabetes Brother    Heart disease Brother    Heart disease Brother    Cancer Other        family history   Heart attack Other        family history    Social History Social History   Tobacco Use   Smoking status: Never   Smokeless  tobacco: Never  Vaping Use   Vaping Use: Never used  Substance Use Topics   Alcohol use:  No    Alcohol/week: 0.0 standard drinks of alcohol   Drug use: No     Allergies   Codeine phosphate, Morphine and related, Amoxicillin, Penicillins, Methotrexate, and Lidocaine   Review of Systems Review of Systems Per HPI  Physical Exam Triage Vital Signs ED Triage Vitals  Enc Vitals Group     BP 09/15/22 0953 (!) 146/78     Pulse Rate 09/15/22 0953 66     Resp 09/15/22 0953 18     Temp 09/15/22 0953 98 F (36.7 C)     Temp Source 09/15/22 0953 Oral     SpO2 09/15/22 0953 98 %     Weight --      Height --      Head Circumference --      Peak Flow --      Pain Score 09/15/22 1055 4     Pain Loc --      Pain Edu? --      Excl. in [place]? --    No data found.  Updated Vital Signs BP (!) 146/78 (BP Location: Right Arm)   Pulse 66   Temp 98 F (36.7 C) (Oral)   Resp 18   SpO2 98%   Visual Acuity Right Eye Distance:   Left Eye Distance:   Bilateral Distance:    Right Eye Near:   Left Eye Near:    Bilateral Near:     Physical Exam Vitals and nursing note reviewed.  Constitutional:      General: She is not in acute distress.    Appearance: Normal appearance. She is not toxic-appearing.  HENT:     Head: Normocephalic and atraumatic.     Nose: Nose normal. No congestion or rhinorrhea.     Mouth/Throat:     Mouth: Mucous membranes are moist.     Pharynx: Oropharynx is clear.  Cardiovascular:     Rate and Rhythm: Normal rate and regular rhythm.  Pulmonary:     Effort: Pulmonary effort is normal. No respiratory distress.     Breath sounds: Normal breath sounds. No wheezing, rhonchi or rales.  Musculoskeletal:     Right lower leg: Normal. No swelling, deformity or tenderness. No edema.     Left lower leg: Tenderness present. No deformity or bony tenderness. 2+ Pitting Edema present.     Right foot: Normal. Normal range of motion and normal capillary refill. No swelling, tenderness or bony tenderness. Normal pulse.     Left foot: Normal. Normal range of motion  and normal capillary refill. No swelling, tenderness or bony tenderness. Normal pulse.       Legs:     Comments: V shaped approximately 2 cm open wound to left lower extremity in approximately area marked; there is surrounding pitting edema.  No redness, fluctuance.  The area around the wound is tender to touch.  Skin:    General: Skin is warm and dry.     Capillary Refill: Capillary refill takes less than 2 seconds.     Coloration: Skin is not jaundiced or pale.     Findings: No erythema.  Neurological:     Mental Status: She is alert and oriented to person, place, and time.     Gait: Gait abnormal (wheelchair bound at baseline).  Psychiatric:        Behavior: Behavior is cooperative.      UC  Treatments / Results  Labs (all labs ordered are listed, but only abnormal results are displayed) Labs Reviewed - No data to display  EKG   Radiology No results found.  Procedures Procedures (including critical care time)  Medications Ordered in UC Medications - No data to display  Initial Impression / Assessment and Plan / UC Course  I have reviewed the triage vital signs and the nursing notes.  Pertinent labs & imaging results that were available during my care of the patient were reviewed by me and considered in my medical decision making (see chart for details).   Patient is well-appearing, normotensive, afebrile, not tachycardic, not tachypneic, oxygenating well on room air.    1. Unspecified open wound, left lower leg, initial encounter 2. Dog scratch Wound care discussed with patient and husband Recommended discontinuing use of hydrogen peroxide and starting wound care with twice daily mild soap and water cleanses followed by pressure bandage with nonadherent gauze I am suspicious for superficial cellulitis given swelling She recently finished a zpack for her breathing, will treat with Avelox   ER and return precautions discussed  The patient was given the opportunity to  ask questions.  All questions answered to their satisfaction.  The patient is in agreement to this plan.    Final Clinical Impressions(s) / UC Diagnoses   Final diagnoses:  Unspecified open wound, left lower leg, initial encounter  Dog scratch     Discharge Instructions      Please stop using hydrogen peroxide on your wounds.  You can use mild soap and water to clean it twice daily, then apply a pressure dressing twice daily.  Start the antibiotic that I sent to the pharmacy to help with any possible infection in the skin around the dog scratch.  Please follow-up with your primary care provider if symptoms persist or worsen despite treatment.    ED Prescriptions     Medication Sig Dispense Auth. Provider   moxifloxacin (AVELOX) 400 MG tablet Take 1 tablet (400 mg total) by mouth daily at 8 pm. 7 tablet Eulogio Bear, NP      PDMP not reviewed this encounter.   Eulogio Bear, NP 09/15/22 [phone]

## 2022-09-15 NOTE — ED Notes (Signed)
  Pt reports her dog scratched the left leg 1 week ago. Reports she is bleeding still.

## 2022-10-14 ENCOUNTER — Ambulatory Visit (HOSPITAL_COMMUNITY)
Admission: RE | Admit: 2022-10-14 | Discharge: 2022-10-14 | Disposition: A | Discharge status: Home / Self Care | Payer: Medicare Other | Source: Ambulatory Visit | Attending: Internal Medicine | Admitting: Internal Medicine

## 2022-10-14 ENCOUNTER — Other Ambulatory Visit (HOSPITAL_COMMUNITY): Payer: Self-pay | Admitting: Internal Medicine

## 2022-10-14 DIAGNOSIS — J9811 Atelectasis: Secondary | ICD-10-CM | POA: Diagnosis not present

## 2022-10-14 DIAGNOSIS — M069 Rheumatoid arthritis, unspecified: Secondary | ICD-10-CM | POA: Diagnosis not present

## 2022-10-14 DIAGNOSIS — R053 Chronic cough: Secondary | ICD-10-CM | POA: Diagnosis not present

## 2022-10-14 DIAGNOSIS — K219 Gastro-esophageal reflux disease without esophagitis: Secondary | ICD-10-CM | POA: Diagnosis not present

## 2022-10-14 DIAGNOSIS — M0579 Rheumatoid arthritis with rheumatoid factor of multiple sites without organ or systems involvement: Secondary | ICD-10-CM | POA: Diagnosis not present

## 2022-10-14 DIAGNOSIS — I4891 Unspecified atrial fibrillation: Secondary | ICD-10-CM | POA: Diagnosis not present

## 2022-10-14 DIAGNOSIS — I700 Atherosclerosis of aorta: Secondary | ICD-10-CM | POA: Diagnosis not present

## 2022-10-14 DIAGNOSIS — G894 Chronic pain syndrome: Secondary | ICD-10-CM | POA: Diagnosis not present

## 2022-10-14 DIAGNOSIS — R059 Cough, unspecified: Secondary | ICD-10-CM | POA: Diagnosis not present

## 2022-10-14 DIAGNOSIS — J189 Pneumonia, unspecified organism: Secondary | ICD-10-CM | POA: Diagnosis not present

## 2022-11-24 ENCOUNTER — Ambulatory Visit: Payer: Medicare Other | Admitting: Physician Assistant

## 2022-12-14 ENCOUNTER — Other Ambulatory Visit: Payer: Self-pay | Admitting: *Deleted

## 2022-12-14 MED ORDER — SPIRONOLACTONE 25 MG PO TABS: 25.0000 mg | ORAL_TABLET | Freq: Every day | ORAL | 2 refills | Status: DC

## 2022-12-21 ENCOUNTER — Telehealth: Payer: Self-pay | Admitting: Cardiology

## 2022-12-21 ENCOUNTER — Telehealth: Payer: Self-pay

## 2022-12-21 NOTE — Telephone Encounter (Signed)
error 

## 2022-12-21 NOTE — Telephone Encounter (Signed)
Pt c/o medication issue:  1. Name of Medication:   apixaban (ELIQUIS) 5 MG TABS tablet   2. How are you currently taking this medication (dosage and times per day)?   As prescribed  3. Are you having a reaction (difficulty breathing--STAT)?   No  4. What is your medication issue?   Patient stated this medication is too expensive for her even with her co-pay.  Patient wants a less expensive medication or other options.

## 2022-12-21 NOTE — Telephone Encounter (Signed)
Spoke with the patient, she is currently taking Eliquis 5 mg BID. Pt stated her co-pay has increased to $435 for 3 month supply and she can't afford the co-pay. Will forward to Pharm D for advise.

## 2022-12-21 NOTE — Telephone Encounter (Signed)
Sounds like pt is in the donut hole. She can try applying for pt assistance, would need to have spent 3% of her annual household income on med costs before she's covered though. Application found here: [employer].co.nz.[email or web address]  Otherwise, Xarelto has a program for pts in the donut hole where they can get med for ~$89/month.  Pradaxa could be another option, it's generic now and using coupon on GoodRx, copay is ~$70/month.  If all of these options are cost prohibitive/she doesn't qualify for Eliquis pt assistance, would need to change to warfarin which is very cheap if not free, however this would require routine INR monitoring in the office.

## 2022-12-21 NOTE — Telephone Encounter (Signed)
Attempted to call the patient unable to leave VM message.  Sent pharm D advise to pt MyChart.

## 2022-12-23 NOTE — Telephone Encounter (Signed)
Will send this message to our PA Nurse to see if she could could assist the pt with this matter and with pt assistance for this medication.

## 2022-12-23 NOTE — Telephone Encounter (Signed)
Patient is returning call.  °

## 2022-12-23 NOTE — Telephone Encounter (Signed)
**Note De-Identified  Obfuscation** I s/w the pt and gave her all options mentioned by Margaretmary Dys, [facility] and she wants to continue with Eliquis although she states that she cannot afford it. I gave her [employer] phone number and advised her to call them with questions about their Eliquis assistance program and her eligibility to be approved for the program.  She is aware to call me back if she is advised that she is not eligible for approval through [employer] for Eliquis assistance and she cannot afford.  Per her request, I have left her a [employer] application in the front office for her to pick up to save her time waiting for one to be mailed to her.  She verbalized understanding to all instructions/advice given and thanked me for calling her back to discuss her anticoagulation medication options.

## 2023-01-05 ENCOUNTER — Ambulatory Visit (HOSPITAL_COMMUNITY)
Admission: RE | Admit: 2023-01-05 | Discharge: 2023-01-05 | Disposition: A | Discharge status: Home / Self Care | Payer: Medicare Other | Source: Ambulatory Visit | Attending: Cardiology | Admitting: Cardiology

## 2023-01-05 DIAGNOSIS — I6523 Occlusion and stenosis of bilateral carotid arteries: Secondary | ICD-10-CM | POA: Diagnosis not present

## 2023-01-11 ENCOUNTER — Telehealth: Payer: Self-pay

## 2023-01-11 NOTE — Telephone Encounter (Signed)
**Note De-Identified  Obfuscation** The pts completed [employer] application was left at the officewith her POI but not oop RX expense report. She did include a handwritten letter explaining her health care and medication cost for this year (May not be accepted by [facility] as they req a printed out receipt from pts pharmacy in most cases.  I have completed the providers page of her application and have e-mailed all to the nurse working with Dr Shari Prows today/tomorrow so she can obtain her signature, date nd to then fax all to [facility] at the fax number written on the cover letter included.

## 2023-01-19 ENCOUNTER — Encounter (HOSPITAL_COMMUNITY): Payer: Medicare Other

## 2023-01-21 ENCOUNTER — Other Ambulatory Visit: Payer: Self-pay | Admitting: Physician Assistant

## 2023-01-24 ENCOUNTER — Telehealth: Payer: Self-pay | Admitting: Cardiology

## 2023-01-24 NOTE — Telephone Encounter (Signed)
Pt c/o swelling: STAT is pt has developed SOB within 24 hours  If swelling, where is the swelling located? Both legs; legs are leaking fluids; have to put pad under legs at night   How much weight have you gained and in what time span? Can gain 5 lbs a day due to fluid  Have you gained 3 pounds in a day or 5 pounds in a week? Yes   Do you have a log of your daily weights (if so, list)? No   Are you currently taking a fluid pill? Yes   Are you currently SOB? No   Have you traveled recently? No

## 2023-01-24 NOTE — Telephone Encounter (Signed)
Pt is calling in with complaints of her left leg oozing at the spot where her puppy just punctured a hole at last week.  Pt states her puppy about 5 days ago jumped on her leg causing her a puncture wound to her left leg.   She states ever since then, her leg has been oozing clear liquids from that area.   Pt states no bleeding is coming from the site.  She states she doesn't think it is infected but unsure.  She states it's a slow oozing.  She reports this is only her left leg in the area where the puncture site is, that is leaking fluids.   She voiced no cardiac complaints on the phone like chest pain, sob, doe, cough, pre-syncope or syncope.   She said she is just worried about the fluid oozing from site and wonders if it is coming from her heart.   Advised the pt that she should get in with her PCP for further evaluation of this.  Informed her that it sounds like she could have an infection in that leg and needs to be assessed by PCP and treated accordingly first.   Advised her in PCP feels that Cards needs to see her thereafter, we will be glad to arrange for her an appt to be seen.   Pt is aware that Dr. Shari Prows is out of the office this week.  Advised her to call PCP today to obtain the appt.   Advised her to try and keep the area/leg clean.  Advised her to wrap this as needed.    Pt is aware that I will still route this information to Dr. Shari Prows to make her aware of this plan.  Pt verbalized understanding and agrees with this plan.

## 2023-01-26 ENCOUNTER — Other Ambulatory Visit: Payer: Self-pay

## 2023-01-31 ENCOUNTER — Telehealth: Payer: Self-pay | Admitting: Cardiology

## 2023-01-31 NOTE — Telephone Encounter (Signed)
Spoke with the patient who states that she has been feeling more fatigued recently. Yesterday when she was about to get into the shower she had an episode where she felt like she was going to pass out. She states her heart rate was elevated at 114. She states that she was able to relax and felt a little bit better. She is taking metoprolol 50 mg in the morning and 75 mg in the evening. Advised that should could take an extra 25 mg as needed for elevated heart rates. She is feeling better today. Denies any dizziness, lightheadedness or shortness of breath. She has a history of SVT and Afib. She will continue to monitor and ensure that she is staying hydrated. I will make Dr. Shari Prows aware for any further recommendations

## 2023-01-31 NOTE — Telephone Encounter (Signed)
Spoke with the patient, she will call us back if she has more frequent episodes.

## 2023-01-31 NOTE — Telephone Encounter (Signed)
STAT if HR is under 50 or over 120 (normal HR is 60-100 beats per minute)  What is your heart rate? 94  Do you have a log of your heart rate readings (document readings)? 114 last night   Do you have any other symptoms? States she felt dizzy yesterday, she felt like she was going to pass out but she didn't.

## 2023-02-02 ENCOUNTER — Emergency Department (HOSPITAL_COMMUNITY): Payer: Medicare Other

## 2023-02-02 ENCOUNTER — Other Ambulatory Visit: Payer: Self-pay

## 2023-02-02 ENCOUNTER — Encounter (HOSPITAL_COMMUNITY): Payer: Self-pay | Admitting: Emergency Medicine

## 2023-02-02 ENCOUNTER — Inpatient Hospital Stay (HOSPITAL_COMMUNITY)
Admission: EM | Admit: 2023-02-02 | Discharge: 2023-02-05 | DRG: 372 | Disposition: A | Discharge status: Home Health | Payer: Medicare Other | Attending: Internal Medicine | Admitting: Internal Medicine

## 2023-02-02 DIAGNOSIS — R079 Chest pain, unspecified: Secondary | ICD-10-CM | POA: Diagnosis not present

## 2023-02-02 DIAGNOSIS — K5730 Diverticulosis of large intestine without perforation or abscess without bleeding: Secondary | ICD-10-CM | POA: Diagnosis not present

## 2023-02-02 DIAGNOSIS — M069 Rheumatoid arthritis, unspecified: Secondary | ICD-10-CM | POA: Diagnosis present

## 2023-02-02 DIAGNOSIS — F419 Anxiety disorder, unspecified: Secondary | ICD-10-CM | POA: Diagnosis present

## 2023-02-02 DIAGNOSIS — Z8619 Personal history of other infectious and parasitic diseases: Secondary | ICD-10-CM

## 2023-02-02 DIAGNOSIS — R609 Edema, unspecified: Secondary | ICD-10-CM

## 2023-02-02 DIAGNOSIS — Z884 Allergy status to anesthetic agent status: Secondary | ICD-10-CM | POA: Diagnosis not present

## 2023-02-02 DIAGNOSIS — E785 Hyperlipidemia, unspecified: Secondary | ICD-10-CM | POA: Diagnosis not present

## 2023-02-02 DIAGNOSIS — R0602 Shortness of breath: Secondary | ICD-10-CM | POA: Diagnosis not present

## 2023-02-02 DIAGNOSIS — Z8249 Family history of ischemic heart disease and other diseases of the circulatory system: Secondary | ICD-10-CM

## 2023-02-02 DIAGNOSIS — N179 Acute kidney failure, unspecified: Secondary | ICD-10-CM | POA: Diagnosis present

## 2023-02-02 DIAGNOSIS — Z7952 Long term (current) use of systemic steroids: Secondary | ICD-10-CM

## 2023-02-02 DIAGNOSIS — Z9861 Coronary angioplasty status: Secondary | ICD-10-CM

## 2023-02-02 DIAGNOSIS — I6523 Occlusion and stenosis of bilateral carotid arteries: Secondary | ICD-10-CM

## 2023-02-02 DIAGNOSIS — I5032 Chronic diastolic (congestive) heart failure: Secondary | ICD-10-CM | POA: Diagnosis present

## 2023-02-02 DIAGNOSIS — Z8673 Personal history of transient ischemic attack (TIA), and cerebral infarction without residual deficits: Secondary | ICD-10-CM | POA: Diagnosis not present

## 2023-02-02 DIAGNOSIS — Z79899 Other long term (current) drug therapy: Secondary | ICD-10-CM

## 2023-02-02 DIAGNOSIS — R152 Fecal urgency: Secondary | ICD-10-CM | POA: Diagnosis not present

## 2023-02-02 DIAGNOSIS — M059 Rheumatoid arthritis with rheumatoid factor, unspecified: Secondary | ICD-10-CM | POA: Diagnosis not present

## 2023-02-02 DIAGNOSIS — I110 Hypertensive heart disease with heart failure: Secondary | ICD-10-CM | POA: Diagnosis present

## 2023-02-02 DIAGNOSIS — I2510 Atherosclerotic heart disease of native coronary artery without angina pectoris: Secondary | ICD-10-CM | POA: Diagnosis not present

## 2023-02-02 DIAGNOSIS — E876 Hypokalemia: Secondary | ICD-10-CM | POA: Diagnosis not present

## 2023-02-02 DIAGNOSIS — Z6841 Body Mass Index (BMI) 40.0 and over, adult: Secondary | ICD-10-CM

## 2023-02-02 DIAGNOSIS — Z7901 Long term (current) use of anticoagulants: Secondary | ICD-10-CM

## 2023-02-02 DIAGNOSIS — R109 Unspecified abdominal pain: Secondary | ICD-10-CM | POA: Diagnosis not present

## 2023-02-02 DIAGNOSIS — Z7982 Long term (current) use of aspirin: Secondary | ICD-10-CM

## 2023-02-02 DIAGNOSIS — Z9049 Acquired absence of other specified parts of digestive tract: Secondary | ICD-10-CM | POA: Diagnosis not present

## 2023-02-02 DIAGNOSIS — R0989 Other specified symptoms and signs involving the circulatory and respiratory systems: Secondary | ICD-10-CM | POA: Diagnosis not present

## 2023-02-02 DIAGNOSIS — R600 Localized edema: Secondary | ICD-10-CM

## 2023-02-02 DIAGNOSIS — Z880 Allergy status to penicillin: Secondary | ICD-10-CM

## 2023-02-02 DIAGNOSIS — E782 Mixed hyperlipidemia: Secondary | ICD-10-CM

## 2023-02-02 DIAGNOSIS — Z888 Allergy status to other drugs, medicaments and biological substances status: Secondary | ICD-10-CM | POA: Diagnosis not present

## 2023-02-02 DIAGNOSIS — R197 Diarrhea, unspecified: Secondary | ICD-10-CM | POA: Diagnosis not present

## 2023-02-02 DIAGNOSIS — Z885 Allergy status to narcotic agent status: Secondary | ICD-10-CM | POA: Diagnosis not present

## 2023-02-02 DIAGNOSIS — R110 Nausea: Secondary | ICD-10-CM

## 2023-02-02 DIAGNOSIS — M810 Age-related osteoporosis without current pathological fracture: Secondary | ICD-10-CM | POA: Diagnosis present

## 2023-02-02 DIAGNOSIS — D6869 Other thrombophilia: Secondary | ICD-10-CM

## 2023-02-02 DIAGNOSIS — Z9851 Tubal ligation status: Secondary | ICD-10-CM | POA: Diagnosis not present

## 2023-02-02 DIAGNOSIS — I10 Essential (primary) hypertension: Secondary | ICD-10-CM | POA: Diagnosis not present

## 2023-02-02 DIAGNOSIS — A040 Enteropathogenic Escherichia coli infection: Secondary | ICD-10-CM | POA: Diagnosis not present

## 2023-02-02 DIAGNOSIS — I480 Paroxysmal atrial fibrillation: Secondary | ICD-10-CM | POA: Diagnosis present

## 2023-02-02 DIAGNOSIS — Z79631 Long term (current) use of antimetabolite agent: Secondary | ICD-10-CM

## 2023-02-02 DIAGNOSIS — A498 Other bacterial infections of unspecified site: Secondary | ICD-10-CM | POA: Diagnosis not present

## 2023-02-02 DIAGNOSIS — I517 Cardiomegaly: Secondary | ICD-10-CM | POA: Diagnosis not present

## 2023-02-02 LAB — CBC
HCT: 36.2 % (ref 36.0–46.0)
Hemoglobin: 11.4 g/dL — ABNORMAL LOW (ref 12.0–15.0)
MCH: 29.8 pg (ref 26.0–34.0)
MCHC: 31.5 g/dL (ref 30.0–36.0)
MCV: 94.8 fL (ref 80.0–100.0)
Platelets: 297 10*3/uL (ref 150–400)
RBC: 3.82 MIL/uL — ABNORMAL LOW (ref 3.87–5.11)
RDW: 16.5 % — ABNORMAL HIGH (ref 11.5–15.5)
WBC: 11.7 10*3/uL — ABNORMAL HIGH (ref 4.0–10.5)
nRBC: 0 % (ref 0.0–0.2)

## 2023-02-02 LAB — COMPREHENSIVE METABOLIC PANEL
ALT: 12 U/L (ref 0–44)
AST: 13 U/L — ABNORMAL LOW (ref 15–41)
Albumin: 3.1 g/dL — ABNORMAL LOW (ref 3.5–5.0)
Alkaline Phosphatase: 67 U/L (ref 38–126)
Anion gap: 16 — ABNORMAL HIGH (ref 5–15)
BUN: 26 mg/dL — ABNORMAL HIGH (ref 8–23)
CO2: 19 mmol/L — ABNORMAL LOW (ref 22–32)
Calcium: 8.7 mg/dL — ABNORMAL LOW (ref 8.9–10.3)
Chloride: 99 mmol/L (ref 98–111)
Creatinine, Ser: 2.27 mg/dL — ABNORMAL HIGH (ref 0.44–1.00)
GFR, Estimated: 23 mL/min — ABNORMAL LOW (ref >60)
Glucose, Bld: 119 mg/dL — ABNORMAL HIGH (ref 70–99)
Potassium: 3.4 mmol/L — ABNORMAL LOW (ref 3.5–5.1)
Sodium: 134 mmol/L — ABNORMAL LOW (ref 135–145)
Total Bilirubin: 0.7 mg/dL (ref 0.3–1.2)
Total Protein: 6.1 g/dL — ABNORMAL LOW (ref 6.5–8.1)

## 2023-02-02 LAB — TROPONIN I (HIGH SENSITIVITY)
Troponin I (High Sensitivity): 11 ng/L (ref <18)
Troponin I (High Sensitivity): 10 ng/L (ref <18)

## 2023-02-02 LAB — LIPASE, BLOOD: Lipase: 39 U/L (ref 11–51)

## 2023-02-02 LAB — MAGNESIUM: Magnesium: 1.5 mg/dL — ABNORMAL LOW (ref 1.7–2.4)

## 2023-02-02 MED ORDER — ATORVASTATIN CALCIUM 40 MG PO TABS
40.0000 mg | ORAL_TABLET | Freq: Every day | ORAL | Status: DC
  Administered 2023-02-03 – 2023-02-05 (×3): 40 mg via ORAL
  Filled 2023-02-02 (×3): qty 1

## 2023-02-02 MED ORDER — ASPIRIN 81 MG PO TBEC
81.0000 mg | DELAYED_RELEASE_TABLET | Freq: Every day | ORAL | Status: DC
  Administered 2023-02-02 – 2023-02-04 (×3): 81 mg via ORAL
  Filled 2023-02-02 (×3): qty 1

## 2023-02-02 MED ORDER — POTASSIUM CHLORIDE CRYS ER 10 MEQ PO TBCR
10.0000 meq | EXTENDED_RELEASE_TABLET | Freq: Every day | ORAL | Status: DC
  Administered 2023-02-02 – 2023-02-05 (×4): 10 meq via ORAL
  Filled 2023-02-02 (×4): qty 1

## 2023-02-02 MED ORDER — MAGNESIUM SULFATE 2 GM/50ML IV SOLN
2.0000 g | Freq: Once | INTRAVENOUS | Status: AC
  Administered 2023-02-02: 2 g via INTRAVENOUS
  Filled 2023-02-02: qty 50

## 2023-02-02 MED ORDER — APIXABAN 5 MG PO TABS
5.0000 mg | ORAL_TABLET | Freq: Two times a day (BID) | ORAL | Status: DC
  Administered 2023-02-02: 5 mg via ORAL
  Filled 2023-02-02 (×2): qty 1

## 2023-02-02 MED ORDER — ACETAMINOPHEN 325 MG PO TABS
650.0000 mg | ORAL_TABLET | Freq: Four times a day (QID) | ORAL | Status: DC | PRN
  Administered 2023-02-03 – 2023-02-04 (×3): 650 mg via ORAL
  Filled 2023-02-02 (×3): qty 2

## 2023-02-02 MED ORDER — ACETAMINOPHEN 650 MG RE SUPP: 650.0000 mg | Freq: Four times a day (QID) | RECTAL | Status: DC | PRN

## 2023-02-02 MED ORDER — LACTATED RINGERS IV BOLUS
500.0000 mL | Freq: Once | INTRAVENOUS | Status: AC
  Administered 2023-02-02: 500 mL via INTRAVENOUS

## 2023-02-02 MED ORDER — LOPERAMIDE HCL 2 MG PO CAPS
2.0000 mg | ORAL_CAPSULE | ORAL | Status: DC | PRN
  Administered 2023-02-04: 2 mg via ORAL
  Filled 2023-02-02: qty 1

## 2023-02-02 MED ORDER — SODIUM CHLORIDE 0.9% FLUSH
3.0000 mL | Freq: Two times a day (BID) | INTRAVENOUS | Status: DC
  Administered 2023-02-03 – 2023-02-05 (×5): 3 mL via INTRAVENOUS

## 2023-02-02 MED ORDER — LACTATED RINGERS IV SOLN: INTRAVENOUS | Status: DC

## 2023-02-02 MED ORDER — PROCHLORPERAZINE EDISYLATE 10 MG/2ML IJ SOLN
5.0000 mg | Freq: Four times a day (QID) | INTRAMUSCULAR | Status: DC | PRN
  Administered 2023-02-05: 5 mg via INTRAVENOUS
  Filled 2023-02-02: qty 2

## 2023-02-02 MED ORDER — OXYCODONE HCL 5 MG PO TABS: 5.0000 mg | ORAL_TABLET | ORAL | Status: DC | PRN

## 2023-02-02 MED ORDER — HYDROXYCHLOROQUINE SULFATE 200 MG PO TABS
200.0000 mg | ORAL_TABLET | Freq: Two times a day (BID) | ORAL | Status: DC
  Administered 2023-02-02 – 2023-02-05 (×6): 200 mg via ORAL
  Filled 2023-02-02 (×6): qty 1

## 2023-02-02 MED ORDER — ALLOPURINOL 100 MG PO TABS
400.0000 mg | ORAL_TABLET | Freq: Every day | ORAL | Status: DC
  Administered 2023-02-02 – 2023-02-04 (×3): 400 mg via ORAL
  Filled 2023-02-02 (×3): qty 4

## 2023-02-02 MED ORDER — ONDANSETRON HCL 4 MG/2ML IJ SOLN
4.0000 mg | Freq: Once | INTRAMUSCULAR | Status: AC
  Administered 2023-02-02: 4 mg via INTRAVENOUS
  Filled 2023-02-02: qty 2

## 2023-02-02 MED ORDER — METOPROLOL TARTRATE 25 MG PO TABS
75.0000 mg | ORAL_TABLET | Freq: Two times a day (BID) | ORAL | Status: DC
  Administered 2023-02-03 (×2): 75 mg via ORAL
  Filled 2023-02-02 (×3): qty 3

## 2023-02-02 MED ORDER — DICYCLOMINE HCL 10 MG PO CAPS
20.0000 mg | ORAL_CAPSULE | Freq: Once | ORAL | Status: AC
  Administered 2023-02-02: 20 mg via ORAL
  Filled 2023-02-02: qty 2

## 2023-02-02 MED ORDER — ZOLPIDEM TARTRATE 5 MG PO TABS: 5.0000 mg | ORAL_TABLET | Freq: Every evening | ORAL | Status: DC | PRN

## 2023-02-02 NOTE — ED Notes (Addendum)
Unable to get IV access at this time. Will put in IV consult

## 2023-02-02 NOTE — ED Notes (Signed)
Called out pts name multiple times with no response

## 2023-02-02 NOTE — ED Triage Notes (Addendum)
Pt via POV c/o several months of diarrhea, 10/10 crampy intermittent abdominal pain, and fatigue with lightheadedness when she stands. She has also had some periods of SOB and chest pain rated 7/10 intermittently. Pt has PMH diverticulitis and feels like this may be the same. Diarrhea unrelieved by OTC methods.

## 2023-02-02 NOTE — H&P (Addendum)
History and Physical    Cindy Holmes PPI:[id number] DOB: Jul 07, 1951 DOA: 02/02/2023  PCP: [name], MD   Patient coming from: Home   Chief Complaint: Abdominal pain, diarrhea   HPI: Cindy Holmes is a 72 y.o. female with medical history significant for hypertension, rheumatoid arthritis, CAD, chronic HFpEF, and history of CVA presents to the ED with diarrhea and abdominal pain.  Patient reports roughly 6 months of diarrhea and abdominal discomfort that has worsened significantly in the past couple weeks.  She has generalized abdominal discomfort which is sometimes more severe in the left lower quadrant.  She has more than 10 episodes of diarrhea daily.  She denies any melena or hematochezia.  She has had nausea associated with this but no vomiting.  She denies any travel or sick contacts prior to symptom development.  She has been using Imodium and Pepto-Bismol at home without much relief.  ED Course: Upon arrival to the ED, patient is found to be afebrile and saturating well on room air with normal heart rate and stable blood pressure.  No acute findings noted on CT of the abdomen pelvis.  Labs are most notable for creatinine 2.27.  Patient was given IV fluids, Zofran, and Bentyl in the ED.  Review of Systems:  All other systems reviewed and apart from HPI, are negative.  Past Medical History:  Diagnosis Date   CAD (coronary artery disease)    a. PTCA LAD 2001/cath 2002-prox and mid LAD stents patent, PTCA ostium Dx 1 b. cath 03/2016: patent LAD stent with less than 40% in-stent restenosis, 10-30% stenosis of D1 and distal LAD with continued medical management recommended. 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    Past Surgical History:  Procedure Laterality Date   CARDIAC CATHETERIZATION     with stent placement in 2001   CARDIAC CATHETERIZATION N/A 03/12/2016   Procedure: Left Heart Cath and Coronary Angiography;  Surgeon: Yvonne Kendall, MD;  Location: [facility] INVASIVE CV LAB;  Service: Cardiovascular;  Laterality: N/A;   CARDIAC CATHETERIZATION N/A 03/12/2016   Procedure: Intravascular Pressure Wire/FFR Study;  Surgeon: Yvonne Kendall, MD;  Location: [facility] INVASIVE CV LAB;  Service: Cardiovascular;  Laterality: N/A;   CHOLECYSTECTOMY     CORONARY ANGIOPLASTY     2002   ESOPHAGOGASTRODUODENOSCOPY N/A 12/03/2016   Procedure: ESOPHAGOGASTRODUODENOSCOPY (EGD);  Surgeon: Jeani Hawking, MD;  Location: [facility] ENDOSCOPY;  Service: Endoscopy;  Laterality: N/A;   laproscopic adjustable gastric  banding     with APS standard system.    OPEN REDUCTION INTERNAL FIXATION (ORIF) DISTAL RADIAL FRACTURE Right 05/15/2018   Procedure: OPEN REDUCTION INTERNAL FIXATION (ORIF) DISTAL RADIAL FRACTURE;  Surgeon: Roby Lofts, MD;  Location: [facility] OR;  Service:  Orthopedics;  Laterality: Right;   ORIF ANKLE FRACTURE Right 05/15/2018   Procedure: OPEN REDUCTION INTERNAL FIXATION (ORIF) ANKLE FRACTURE;  Surgeon: Roby Lofts, MD;  Location: [facility] OR;  Service: Orthopedics;  Laterality: Right;   ORIF TIBIA PLATEAU Right 05/15/2018   Procedure: OPEN REDUCTION INTERNAL FIXATION (ORIF) TIBIAL PLATEAU;  Surgeon: Roby Lofts, MD;  Location: [facility] OR;  Service: Orthopedics;  Laterality: Right;   Pt has 3 stents     sept 2001   SHOULDER ARTHROSCOPY  WITH SUBACROMIAL DECOMPRESSION, ROTATOR CUFF REPAIR AND BICEP TENDON REPAIR Right 11/16/2013   Procedure: RIGHT SHOULDER ARTHROSCOPY, EXTENSIVE DEBRIDEMENT, ROTATOR CUFF REPAIR ;  Surgeon: Eulas Post, MD;  Location: [facility];  Service: Orthopedics;  Laterality: Right;   TUBAL LIGATION      Social History:   reports that she has never smoked. She has never used smokeless tobacco. She reports that she does not drink alcohol and does not use drugs.  Allergies  Allergen Reactions   Codeine Phosphate Shortness Of Breath   Morphine And Codeine Nausea And Vomiting   Amoxicillin Nausea And Vomiting   Penicillins Nausea And Vomiting    Has patient had a PCN reaction causing immediate rash, facial/tongue/throat swelling, SOB or lightheadedness with hypotension:YES Has patient had a PCN reaction causing severe rash involving mucus membranes or skin necrosis: NO Has patient had a PCN reaction that required hospitalization NO Has patient had a PCN reaction occurring within the last 10 years: NO If all of the above answers are "NO", then may proceed with Cephalosporin use.   Methotrexate Nausea Only   Lidocaine Rash    Reaction to lidocaine patch    Family History  Problem Relation Age of Onset   Heart attack Mother    Heart disease Mother    Arthritis/Rheumatoid Father    Cirrhosis Father        hepatic cirrhosis   Breast cancer Sister 27   Diabetes Sister    Heart disease Maternal Aunt    Heart attack Maternal Aunt    Throat cancer Maternal Aunt    Heart attack Maternal Uncle    Colon cancer Maternal Grandmother    Throat cancer Maternal Grandfather    Diabetes Brother    Heart disease Brother    Heart disease Brother    Cancer Other        family history   Heart attack Other        family history     Prior to Admission medications   Medication Sig Start Date End Date Taking? Authorizing Provider  acetaminophen (TYLENOL) 500 MG tablet Take 1,000 mg by mouth  every 6 (six) hours as needed for headache (pain).    [provider]  allopurinol (ZYLOPRIM) 100 MG tablet Take 400 mg by mouth at bedtime.    [provider]  apixaban (ELIQUIS) 5 MG TABS tablet Take 1 tablet (5 mg total) by mouth 2 (two) times daily. 12/10/20   Meriam Sprague, MD  aspirin EC 81 MG tablet Take 81 mg by mouth at bedtime. Swallow whole.    [provider]  atorvastatin (LIPITOR) 40 MG tablet Take 2 tablets (80 mg total) by mouth daily 12/10/20   Filbert Schilder, NP  benzonatate (TESSALON PERLES) 100 MG capsule Take 1 capsule (100 mg total) by mouth 3 (three) times daily as needed for cough. 08/13/22   Leath-Warren, Sadie Haber, NP  bisacodyl (DULCOLAX) 5 MG EC tablet Take 5 mg by  mouth at bedtime as needed for moderate constipation.    [provider]  diphenhydramine-acetaminophen (TYLENOL PM) 25-500 MG TABS tablet Take 1 tablet by mouth at bedtime. 10/28/20 01/18/22  Baldwin Jamaica, MD  ezetimibe (ZETIA) 10 MG tablet Take 1 tablet (10 mg total) by mouth daily. 12/10/20   Georgie Chard D, NP  folic acid (FOLVITE) 1 MG tablet Take 1 mg by mouth at bedtime. 03/05/14   [provider]  furosemide (LASIX) 20 MG tablet Take 3 tablets (60 mg total) by mouth in the morning, then take 2 tablets (40 mg total) by mouth in the afternoon, everyday. 08/25/22   Meriam Sprague, MD  hydroxychloroquine (PLAQUENIL) 200 MG tablet Take 200 mg by mouth at bedtime. 09/03/20   [provider]  levocetirizine (XYZAL) 5 MG tablet Take 5 mg by mouth at bedtime. 04/04/18   [provider]  Melatonin 3 MG TABS Take 3 mg by mouth at bedtime as needed (sleep).    [provider]  methotrexate (RHEUMATREX) 2.5 MG tablet Take 10 mg by mouth every Sunday. 08/08/20   [provider]  metoprolol tartrate (LOPRESSOR) 25 MG tablet TAKE 1 TABLET BY MOUTH TWICE  DAILY ALONG WITH 50MG  TABLET FOR A TOTAL OF 75MG  TWICE DAILY 01/21/23   Tereso Newcomer T, PA-C  metoprolol tartrate (LOPRESSOR) 50 MG tablet TAKE 1 TABLET BY MOUTH TWICE  DAILY WITH 25MG  TABLET FOR A  TOTAL DOSE OF 75MG  TWICE DAILY 01/21/23   Tereso Newcomer T, PA-C  moxifloxacin (AVELOX) 400 MG tablet Take 1 tablet (400 mg total) by mouth daily at 8 pm. 09/15/22   Valentino Nose, NP  nitroGLYCERIN (NITROSTAT) 0.4 MG SL tablet DISSOLVE 1 TABLET UNDER THE TONGUE EVERY 5 MINUTES AS  NEEDED FOR CHEST PAIN. MAX  OF 3 TABLETS IN 15 MINUTES. CALL 911 IF PAIN PERSISTS. 05/13/21   Tereso Newcomer T, PA-C  Omega-3 Fatty Acids (FISH OIL) 1000 MG CAPS Take 1,000 mg by mouth at bedtime.    [provider]  oxyCODONE-acetaminophen (PERCOCET/ROXICET) 5-325 MG tablet Take 1 tablet by mouth every 6 (six) hours as needed for severe pain. 03/16/22   Gilda Crease, MD  Potassium Chloride ER 20 MEQ TBCR Take 10 mEq by mouth daily. 12/10/20   Georgie Chard D, NP  ramipril (ALTACE) 2.5 MG capsule Take 1 capsule (2.5 mg total) by mouth daily. 12/10/20   Georgie Chard D, NP  spironolactone (ALDACTONE) 25 MG tablet Take 1 tablet (25 mg total) by mouth daily. 12/14/22   Meriam Sprague, MD  tiZANidine (ZANAFLEX) 2 MG tablet Take 1 tablet (2 mg total) by mouth every 8 (eight) hours as needed for muscle spasms. 05/04/22   Leath-Warren, Sadie Haber, NP  valACYclovir (VALTREX) 1000 MG tablet Take 4,000 mg by mouth once as needed (fever blisters). 10/13/20   [provider]  zolpidem (AMBIEN) 5 MG tablet Take 5 mg by mouth at bedtime. 10/13/20   [provider]    Physical Exam: Vitals:   02/02/23 1350 02/02/23 1356 02/02/23 1929  BP: 127/72  112/62  Pulse: 83  91  Resp: 20  17  Temp: 98 F (36.7 C)  98 F (36.7 C)  TempSrc: Oral  Oral  SpO2: 100%  100%  Weight:  104.3 kg   Height:  5\' 3"  (1.6 m)      Constitutional: NAD, no pallor or diaphoresis  Eyes: PERTLA, lids and conjunctivae normal ENMT: Mucous membranes are moist. Posterior pharynx clear  of any exudate or  lesions.   Neck: supple, no masses  Respiratory: no wheezing, no crackles. No accessory muscle use.  Cardiovascular: S1 & S2 heard, regular rate and rhythm. Lower leg edema b/l.  Abdomen: No distension, no tenderness, soft. Bowel sounds active.  Musculoskeletal: no clubbing / cyanosis. No joint deformity upper and lower extremities.   Skin: no significant rashes, lesions, ulcers. Warm, dry, well-perfused. Neurologic: CN 2-12 grossly intact. Moving all extremities. Alert and oriented.  Psychiatric: Calm. Cooperative.    Labs and Imaging on Admission: I have personally reviewed following labs and imaging studies  CBC: Recent Labs  Lab 02/02/23 1358  WBC 11.7*  HGB 11.4*  HCT 36.2  MCV 94.8  PLT 297   Basic Metabolic Panel: Recent Labs  Lab 02/02/23 1358  NA 134*  K 3.4*  CL 99  CO2 19*  GLUCOSE 119*  BUN 26*  CREATININE 2.27*  CALCIUM 8.7*   GFR: Estimated Creatinine Clearance: 26.3 mL/min (A) (by C-G formula based on SCr of 2.27 mg/dL (H)). Liver Function Tests: Recent Labs  Lab 02/02/23 1358  AST 13*  ALT 12  ALKPHOS 67  BILITOT 0.7  PROT 6.1*  ALBUMIN 3.1*   Recent Labs  Lab 02/02/23 1358  LIPASE 39   No results for input(s): "AMMONIA" in the last 168 hours. Coagulation Profile: No results for input(s): "INR", "PROTIME" in the last 168 hours. Cardiac Enzymes: No results for input(s): "CKTOTAL", "CKMB", "CKMBINDEX", "TROPONINI" in the last 168 hours. BNP (last 3 results) Recent Labs    05/19/22 1204  PROBNP 380*   HbA1C: No results for input(s): "HGBA1C" in the last 72 hours. CBG: No results for input(s): "GLUCAP" in the last 168 hours. Lipid Profile: No results for input(s): "CHOL", "HDL", "LDLCALC", "TRIG", "CHOLHDL", "LDLDIRECT" in the last 72 hours. Thyroid Function Tests: No results for input(s): "TSH", "T4TOTAL", "FREET4", "T3FREE", "THYROIDAB" in the last 72 hours. Anemia Panel: No results for input(s): "VITAMINB12", "FOLATE",  "FERRITIN", "TIBC", "IRON", "RETICCTPCT" in the last 72 hours. Urine analysis:    Component Value Date/Time   COLORURINE YELLOW 10/26/2020 2004   APPEARANCEUR HAZY (A) 10/26/2020 2004   LABSPEC 1.034 (H) 10/26/2020 2004   PHURINE 6.0 10/26/2020 2004   GLUCOSEU NEGATIVE 10/26/2020 2004   HGBUR NEGATIVE 10/26/2020 2004   BILIRUBINUR NEGATIVE 10/26/2020 2004   KETONESUR NEGATIVE 10/26/2020 2004   PROTEINUR NEGATIVE 10/26/2020 2004   UROBILINOGEN 0.2 12/28/2006 0943   NITRITE NEGATIVE 10/26/2020 2004   LEUKOCYTESUR TRACE (A) 10/26/2020 2004   Sepsis Labs: @LABRCNTIP (procalcitonin:4,lacticidven:4) )No results found for this or any previous visit (from the past 240 hour(s)).   Radiological Exams on Admission: CT ABDOMEN PELVIS WO CONTRAST  Result Date: 02/02/2023 CLINICAL DATA:  Acute abdominal pain EXAM: CT ABDOMEN AND PELVIS WITHOUT CONTRAST TECHNIQUE: Multidetector CT imaging of the abdomen and pelvis was performed following the standard protocol without IV contrast. RADIATION DOSE REDUCTION: This exam was performed according to the departmental dose-optimization program which includes automated exposure control, adjustment of the mA and/or kV according to patient size and/or use of iterative reconstruction technique. COMPARISON:  10/26/2020 FINDINGS: Lower chest: Lung bases are free of acute infiltrate or sizable effusion. Hepatobiliary: No focal liver abnormality is seen. Status post cholecystectomy. No biliary dilatation. Pancreas: Unremarkable. No pancreatic ductal dilatation or surrounding inflammatory changes. Spleen: Normal in size without focal abnormality. Adrenals/Urinary Tract: Adrenal glands are within normal limits. Kidneys show no renal calculi or obstructive changes. The bladder is decompressed. Stomach/Bowel: Scattered diverticular change of  the colon is noted. No diverticulitis is seen. The appendix is not well visualized and likely surgically removed. No inflammatory changes  are noted. Small bowel is within normal limits. Stomach shows a gastric lap band in place. Phi angle measures 58 degrees at the upper limits of normal. Correlation is recommended. Vascular/Lymphatic: Aortic atherosclerosis. No enlarged abdominal or pelvic lymph nodes. Reproductive: Uterus and bilateral adnexa are unremarkable. Other: No abdominal wall hernia or abnormality. No abdominopelvic ascites. Musculoskeletal: No acute or significant osseous findings. Chronic compression deformities involving L2, L3 and L4 are noted. IMPRESSION: Diverticulosis without diverticulitis. No acute abnormality is noted. Gastric lap band is noted with the phi angle at the upper limits of normal. Clinical correlation is recommended Electronically Signed   By: Alcide Clever M.D.   On: 02/02/2023 19:27   DG Chest 2 View  Result Date: 02/02/2023 CLINICAL DATA:  Shortness of breath.  Chest pain. EXAM: CHEST - 2 VIEW COMPARISON:  Two-view chest x-ray 10/14/2022 FINDINGS: Heart is enlarged. Atherosclerotic calcifications are present at the aortic arch. Mild pulmonary vascular congestion is present. No focal airspace disease present. No effusions are present. IMPRESSION: Cardiomegaly and mild pulmonary vascular congestion without failure. Electronically Signed   By: Marin Roberts M.D.   On: 02/02/2023 15:29    EKG: Independently reviewed. Sinus rhythm.   Assessment/Plan   1. AKI  - SCr is 2.27 on admission, up from baseline of <1 - No obstructive uropathy on CT in ED  - Continue IVF hydration, hold diuretics and ACE-i, renally-dose medications, repeat chem panel in am   2. Diarrhea  - Check stool for C diff and GI pathogen panel, continue IVF hydration, monitor and replete electrolytes    3. CAD  - No anginal symptoms  - Continue ASA, Lipitor, metoprolol    4. Chronic diastolic CHF  - Appears compensated   - Hold diuretics and ACE-i as above, monitor weight and I/Os   5. Atrial fibrillation  - Continue  Eliquis, metoprolol   6. Rheumatoid arthritis  - Managed with Plaquenil and methotrexate   7. Hx of CVA   - Continue ASA, Lipitor, Eliquis   8. Hypokalemia; hypomagnesemia  - Supplemental potassium and mag given, will repeat chem panel in am    DVT prophylaxis: Eliquis  Code Status: Full  Level of Care: Level of care: Telemetry Medical Family Communication: None present  Disposition Plan:  Patient is from: Home  Anticipated d/c is to: Home  Anticipated d/c date is: 02/05/23  Patient currently: Pending improved/stable renal function  Consults called: None  Admission status: Inpatient     Briscoe Deutscher, MD [facility]  02/02/2023, 8:09 PM

## 2023-02-02 NOTE — ED Provider Notes (Signed)
[place] EMERGENCY DEPARTMENT AT [facility] Provider Note   CSN: [id number] Arrival date & time: 02/02/23  1338     History  Chief Complaint  Patient presents with   Diarrhea   Fatigue    Cindy Holmes is a 72 y.o. female.   Diarrhea  Patient is a 72 year old female with a past medical history significant for HTN, CAD, HLD, stroke, anxiety, diarrhea, rheumatoid arthritis on daily prednisone  She presents emergency room today with complaints of 5 to 6 months of diarrhea she states it is nonbloody.  She states that she has had crampy abdominal pain over the past few months as well.  She states that recently she has developed some fatigue and lightheadedness and feels more weak overall.   She has been using some Imodium without relief.  Has a history of diverticulitis    Home Medications Prior to Admission medications   Medication Sig Start Date End Date Taking? Authorizing Provider  acetaminophen (TYLENOL) 500 MG tablet Take 1,000 mg by mouth every 6 (six) hours as needed for headache (pain).    [provider]  allopurinol (ZYLOPRIM) 100 MG tablet Take 400 mg by mouth at bedtime.    [provider]  apixaban (ELIQUIS) 5 MG TABS tablet Take 1 tablet (5 mg total) by mouth 2 (two) times daily. 12/10/20   Meriam Sprague, MD  aspirin EC 81 MG tablet Take 81 mg by mouth at bedtime. Swallow whole.    [provider]  atorvastatin (LIPITOR) 40 MG tablet Take 2 tablets (80 mg total) by mouth daily 12/10/20   Filbert Schilder, NP  benzonatate (TESSALON PERLES) 100 MG capsule Take 1 capsule (100 mg total) by mouth 3 (three) times daily as needed for cough. 08/13/22   Leath-Warren, Sadie Haber, NP  bisacodyl (DULCOLAX) 5 MG EC tablet Take 5 mg by mouth at bedtime as needed for moderate constipation.    [provider]  diphenhydramine-acetaminophen (TYLENOL PM) 25-500 MG TABS tablet Take 1 tablet by mouth at bedtime. 10/28/20 01/18/22  Baldwin Jamaica, MD  ezetimibe (ZETIA) 10 MG tablet Take 1 tablet (10 mg total) by mouth daily. 12/10/20   Georgie Chard D, NP  folic acid (FOLVITE) 1 MG tablet Take 1 mg by mouth at bedtime. 03/05/14   [provider]  furosemide (LASIX) 20 MG tablet Take 3 tablets (60 mg total) by mouth in the morning, then take 2 tablets (40 mg total) by mouth in the afternoon, everyday. 08/25/22   Meriam Sprague, MD  hydroxychloroquine (PLAQUENIL) 200 MG tablet Take 200 mg by mouth at bedtime. 09/03/20   [provider]  levocetirizine (XYZAL) 5 MG tablet Take 5 mg by mouth at bedtime. 04/04/18   [provider]  Melatonin 3 MG TABS Take 3 mg by mouth at bedtime as needed (sleep).    [provider]  methotrexate (RHEUMATREX) 2.5 MG tablet Take 10 mg by mouth every Sunday. 08/08/20   [provider]  metoprolol tartrate (LOPRESSOR) 25 MG tablet TAKE 1 TABLET BY MOUTH TWICE  DAILY ALONG WITH 50MG  TABLET FOR A TOTAL OF 75MG  TWICE DAILY 01/21/23   Tereso Newcomer T, PA-C  metoprolol tartrate (LOPRESSOR) 50 MG tablet TAKE 1 TABLET BY MOUTH TWICE  DAILY WITH 25MG  TABLET FOR A  TOTAL DOSE OF 75MG  TWICE DAILY 01/21/23   Tereso Newcomer T, PA-C  moxifloxacin (AVELOX) 400 MG tablet Take 1 tablet (400 mg total) by mouth daily at 8 pm.  09/15/22   Valentino Nose, NP  nitroGLYCERIN (NITROSTAT) 0.4 MG SL tablet DISSOLVE 1 TABLET UNDER THE TONGUE EVERY 5 MINUTES AS  NEEDED FOR CHEST PAIN. MAX  OF 3 TABLETS IN 15 MINUTES. CALL 911 IF PAIN PERSISTS. 05/13/21   Tereso Newcomer T, PA-C  Omega-3 Fatty Acids (FISH OIL) 1000 MG CAPS Take 1,000 mg by mouth at bedtime.    [provider]  oxyCODONE-acetaminophen (PERCOCET/ROXICET) 5-325 MG tablet Take 1 tablet by mouth every 6 (six) hours as needed for severe pain. 03/16/22   Gilda Crease, MD  Potassium Chloride ER 20 MEQ TBCR Take 10 mEq by mouth daily. 12/10/20   Georgie Chard D, NP  ramipril (ALTACE) 2.5 MG capsule Take 1 capsule (2.5 mg  total) by mouth daily. 12/10/20   Georgie Chard D, NP  spironolactone (ALDACTONE) 25 MG tablet Take 1 tablet (25 mg total) by mouth daily. 12/14/22   Meriam Sprague, MD  tiZANidine (ZANAFLEX) 2 MG tablet Take 1 tablet (2 mg total) by mouth every 8 (eight) hours as needed for muscle spasms. 05/04/22   Leath-Warren, Sadie Haber, NP  valACYclovir (VALTREX) 1000 MG tablet Take 4,000 mg by mouth once as needed (fever blisters). 10/13/20   [provider]  zolpidem (AMBIEN) 5 MG tablet Take 5 mg by mouth at bedtime. 10/13/20   [provider]      Allergies    Codeine phosphate, Morphine and codeine, Amoxicillin, Penicillins, Methotrexate, and Lidocaine    Review of Systems   Review of Systems  Gastrointestinal:  Positive for diarrhea.    Physical Exam Updated Vital Signs BP 112/62   Pulse 91   Temp 98 F (36.7 C) (Oral)   Resp 18   Ht 5\' 3"  (1.6 m)   Wt 104.3 kg   SpO2 97%   BMI 40.74 kg/m  Physical Exam Vitals and nursing note reviewed.  Constitutional:      General: She is not in acute distress. HENT:     Head: Normocephalic and atraumatic.     Nose: Nose normal.     Mouth/Throat:     Mouth: Mucous membranes are dry.  Eyes:     General: No scleral icterus. Cardiovascular:     Rate and Rhythm: Normal rate and regular rhythm.     Pulses: Normal pulses.     Heart sounds: Normal heart sounds.  Pulmonary:     Effort: Pulmonary effort is normal. No respiratory distress.     Breath sounds: No wheezing.  Abdominal:     Palpations: Abdomen is soft.     Tenderness: There is abdominal tenderness. There is no right CVA tenderness, left CVA tenderness, guarding or rebound.  Musculoskeletal:     Cervical back: Normal range of motion.     Right lower leg: Edema present.     Left lower leg: Edema present.  Skin:    General: Skin is warm and dry.     Capillary Refill: Capillary refill takes less than 2 seconds.  Neurological:     Mental Status: She is alert. Mental  status is at baseline.  Psychiatric:        Mood and Affect: Mood normal.        Behavior: Behavior normal.     ED Results / Procedures / Treatments   Labs (all labs ordered are listed, but only abnormal results are displayed) Labs Reviewed  COMPREHENSIVE METABOLIC PANEL - Abnormal; Notable for the following components:      Result Value  Sodium 134 (*)    Potassium 3.4 (*)    CO2 19 (*)    Glucose, Bld 119 (*)    BUN 26 (*)    Creatinine, Ser 2.27 (*)    Calcium 8.7 (*)    Total Protein 6.1 (*)    Albumin 3.1 (*)    AST 13 (*)    GFR, Estimated 23 (*)    Anion gap 16 (*)    All other components within normal limits  CBC - Abnormal; Notable for the following components:   WBC 11.7 (*)    RBC 3.82 (*)    Hemoglobin 11.4 (*)    RDW 16.5 (*)    All other components within normal limits  MAGNESIUM - Abnormal; Notable for the following components:   Magnesium 1.5 (*)    All other components within normal limits  GASTROINTESTINAL PANEL BY PCR, STOOL (REPLACES STOOL CULTURE)  C DIFFICILE QUICK SCREEN W PCR REFLEX    LIPASE, BLOOD  URINALYSIS, COMPLETE (UACMP) WITH MICROSCOPIC  SODIUM, URINE, RANDOM  CREATININE, URINE, RANDOM  UREA NITROGEN, URINE  BASIC METABOLIC PANEL  MAGNESIUM  CBC  TROPONIN I (HIGH SENSITIVITY)  TROPONIN I (HIGH SENSITIVITY)    EKG None  Radiology CT ABDOMEN PELVIS WO CONTRAST  Result Date: 02/02/2023 CLINICAL DATA:  Acute abdominal pain EXAM: CT ABDOMEN AND PELVIS WITHOUT CONTRAST TECHNIQUE: Multidetector CT imaging of the abdomen and pelvis was performed following the standard protocol without IV contrast. RADIATION DOSE REDUCTION: This exam was performed according to the departmental dose-optimization program which includes automated exposure control, adjustment of the mA and/or kV according to patient size and/or use of iterative reconstruction technique. COMPARISON:  10/26/2020 FINDINGS: Lower chest: Lung bases are free of acute infiltrate or  sizable effusion. Hepatobiliary: No focal liver abnormality is seen. Status post cholecystectomy. No biliary dilatation. Pancreas: Unremarkable. No pancreatic ductal dilatation or surrounding inflammatory changes. Spleen: Normal in size without focal abnormality. Adrenals/Urinary Tract: Adrenal glands are within normal limits. Kidneys show no renal calculi or obstructive changes. The bladder is decompressed. Stomach/Bowel: Scattered diverticular change of the colon is noted. No diverticulitis is seen. The appendix is not well visualized and likely surgically removed. No inflammatory changes are noted. Small bowel is within normal limits. Stomach shows a gastric lap band in place. Phi angle measures 58 degrees at the upper limits of normal. Correlation is recommended. Vascular/Lymphatic: Aortic atherosclerosis. No enlarged abdominal or pelvic lymph nodes. Reproductive: Uterus and bilateral adnexa are unremarkable. Other: No abdominal wall hernia or abnormality. No abdominopelvic ascites. Musculoskeletal: No acute or significant osseous findings. Chronic compression deformities involving L2, L3 and L4 are noted. IMPRESSION: Diverticulosis without diverticulitis. No acute abnormality is noted. Gastric lap band is noted with the phi angle at the upper limits of normal. Clinical correlation is recommended Electronically Signed   By: Alcide Clever M.D.   On: 02/02/2023 19:27   DG Chest 2 View  Result Date: 02/02/2023 CLINICAL DATA:  Shortness of breath.  Chest pain. EXAM: CHEST - 2 VIEW COMPARISON:  Two-view chest x-ray 10/14/2022 FINDINGS: Heart is enlarged. Atherosclerotic calcifications are present at the aortic arch. Mild pulmonary vascular congestion is present. No focal airspace disease present. No effusions are present. IMPRESSION: Cardiomegaly and mild pulmonary vascular congestion without failure. Electronically Signed   By: Marin Roberts M.D.   On: 02/02/2023 15:29    Procedures Procedures     Medications Ordered in ED Medications  ondansetron (ZOFRAN) injection 4 mg (has no administration in time  range)  lactated ringers infusion (has no administration in time range)  lactated ringers bolus 500 mL (has no administration in time range)  allopurinol (ZYLOPRIM) tablet 400 mg (has no administration in time range)  aspirin EC tablet 81 mg (has no administration in time range)  hydroxychloroquine (PLAQUENIL) tablet 200 mg (has no administration in time range)  atorvastatin (LIPITOR) tablet 40 mg (has no administration in time range)  metoprolol tartrate (LOPRESSOR) tablet 75 mg (has no administration in time range)  zolpidem (AMBIEN) tablet 5 mg (has no administration in time range)  apixaban (ELIQUIS) tablet 5 mg (has no administration in time range)  Potassium Chloride ER TBCR 10 mEq (has no administration in time range)  sodium chloride flush (NS) 0.9 % injection 3 mL (has no administration in time range)  acetaminophen (TYLENOL) tablet 650 mg (has no administration in time range)    Or  acetaminophen (TYLENOL) suppository 650 mg (has no administration in time range)  oxyCODONE (Oxy IR/ROXICODONE) immediate release tablet 5 mg (has no administration in time range)  prochlorperazine (COMPAZINE) injection 5 mg (has no administration in time range)  loperamide (IMODIUM) capsule 2 mg (has no administration in time range)  magnesium sulfate IVPB 2 g 50 mL (has no administration in time range)  dicyclomine (BENTYL) capsule 20 mg (20 mg Oral Given 02/02/23 2100)    ED Course/ Medical Decision Making/ A&P                             Medical Decision Making Amount and/or Complexity of Data Reviewed Labs: ordered. Radiology: ordered.  Risk Prescription drug management. Decision regarding hospitalization.   Patient is a 72 year old female with a past medical history significant for HTN, CAD, HLD, stroke, anxiety, diarrhea, rheumatoid arthritis on daily prednisone  She  presents emergency room today with complaints of 5 to 6 months of diarrhea she states it is nonbloody.  She states that she has had crampy abdominal pain over the past few months as well.  She states that recently she has developed some fatigue and lightheadedness and feels more weak overall.   She has been using some Imodium without relief.  Has a history of diverticulitis  Patient's labs show AKI that is significant baseline of 0.9 today is 2.27, BUN/creatinine ratio of approximately 13 consistent with likely prerenal.  CT abdomen pelvis without contrast without obstructive uropathy.  No obstruction or other abnormal findings, lab work shows CMP consistent with dehydration with mild hyponatremia hypokalemia, CBC with mild leukocytosis likely due to patient's chronic prednisone use, troponin within normal limits magnesium slightly low.  Patient given Bentyl, Zofran, gentle hydration ordered  Patient mated to Dr. Antionette Char of hospitalist service for AKI and ongoing diarrhea.  Patient understanding of need for admission.  Final Clinical Impression(s) / ED Diagnoses Final diagnoses:  Diarrhea, unspecified type  Nausea  AKI (acute kidney injury) [facility])    Rx / DC Orders ED Discharge Orders     None         Gailen Shelter, [place] 02/02/23 2108    Bethann Berkshire, MD 02/04/23 1800

## 2023-02-02 NOTE — ED Notes (Signed)
ED TO INPATIENT HANDOFF REPORT  ED Nurse Name and Phone #: Grover Canavan [phone]  S Name/Age/Gender Cindy Holmes 72 y.o. female Room/Bed: 001C/001C  Code Status   Code Status: Full Code  Home/SNF/Other Home Patient oriented to: self, place, time, and situation Is this baseline? Yes   Triage Complete: Triage complete  Chief Complaint AKI (acute kidney injury) (HCC) [N17.9]  Triage Note Pt via POV c/o several months of diarrhea, 10/10 crampy intermittent abdominal pain, and fatigue with lightheadedness when she stands. She has also had some periods of SOB and chest pain rated 7/10 intermittently. Pt has PMH diverticulitis and feels like this may be the same. Diarrhea unrelieved by OTC methods.    Allergies Allergies  Allergen Reactions   Codeine Phosphate Shortness Of Breath   Morphine And Codeine Nausea And Vomiting   Amoxicillin Nausea And Vomiting   Penicillins Nausea And Vomiting    Has patient had a PCN reaction causing immediate rash, facial/tongue/throat swelling, SOB or lightheadedness with hypotension:YES Has patient had a PCN reaction causing severe rash involving mucus membranes or skin necrosis: NO Has patient had a PCN reaction that required hospitalization NO Has patient had a PCN reaction occurring within the last 10 years: NO If all of the above answers are "NO", then may proceed with Cephalosporin use.   Methotrexate Nausea Only   Lidocaine Rash    Reaction to lidocaine patch    Level of Care/Admitting Diagnosis ED Disposition     ED Disposition  Admit   Condition  --   Comment  Hospital Area: [facility] [[id number]]  Level of Care: Telemetry Medical [104]  May admit patient to [facility] or [facility] if equivalent level of care is available:: Yes  Covid Evaluation: Asymptomatic - no recent exposure (last 10 days) testing not required  Diagnosis: AKI (acute kidney injury) [facility]) [[id number]]  Admitting Physician: Briscoe Deutscher [[id number]]   Attending Physician: Briscoe Deutscher [[id number]]  Certification:: I certify this patient will need inpatient services for at least 2 midnights  Estimated Length of Stay: 3          B Medical/Surgery History Past Medical History:  Diagnosis Date   CAD (coronary artery disease)    a. PTCA LAD 2001/cath 2002-prox and mid LAD stents patent, PTCA ostium Dx 1 b. cath 03/2016: patent LAD stent with less than 40% in-stent restenosis, 10-30% stenosis of D1 and distal LAD with continued medical management recommended. 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   Past Surgical History:  Procedure Laterality Date   CARDIAC CATHETERIZATION     with stent placement in 2001   CARDIAC CATHETERIZATION N/A 03/12/2016   Procedure: Left Heart Cath and Coronary Angiography;  Surgeon: Yvonne Kendall, MD;  Location: [facility] INVASIVE CV LAB;  Service: Cardiovascular;  Laterality: N/A;   CARDIAC CATHETERIZATION N/A 03/12/2016   Procedure: Intravascular Pressure Wire/FFR Study;  Surgeon: Yvonne Kendall, MD;  Location: [facility] INVASIVE CV LAB;  Service: Cardiovascular;  Laterality: N/A;   CHOLECYSTECTOMY     CORONARY ANGIOPLASTY     2002   ESOPHAGOGASTRODUODENOSCOPY N/A 12/03/2016   Procedure: ESOPHAGOGASTRODUODENOSCOPY (EGD);  Surgeon: Jeani Hawking, MD;  Location: [facility]  ENDOSCOPY;  Service: Endoscopy;  Laterality: N/A;   laproscopic adjustable gastric  banding     with APS standard system.    OPEN REDUCTION INTERNAL FIXATION (ORIF) DISTAL RADIAL FRACTURE Right 05/15/2018   Procedure: OPEN REDUCTION INTERNAL FIXATION (ORIF) DISTAL RADIAL FRACTURE;  Surgeon: Roby Lofts, MD;  Location: [facility] OR;  Service: Orthopedics;  Laterality: Right;   ORIF ANKLE FRACTURE Right 05/15/2018   Procedure: OPEN REDUCTION INTERNAL FIXATION (ORIF) ANKLE FRACTURE;  Surgeon: Roby Lofts, MD;  Location: [facility] OR;  Service: Orthopedics;  Laterality: Right;   ORIF TIBIA PLATEAU Right 05/15/2018   Procedure: OPEN REDUCTION INTERNAL FIXATION (ORIF) TIBIAL PLATEAU;  Surgeon: Roby Lofts, MD;  Location: [facility] OR;  Service: Orthopedics;  Laterality: Right;   Pt has 3 stents     sept 2001   SHOULDER ARTHROSCOPY WITH SUBACROMIAL DECOMPRESSION, ROTATOR CUFF REPAIR AND BICEP TENDON REPAIR Right 11/16/2013   Procedure: RIGHT SHOULDER ARTHROSCOPY, EXTENSIVE DEBRIDEMENT, ROTATOR CUFF REPAIR ;  Surgeon: Eulas Post, MD;  Location:  SURGERY CENTER;  Service: Orthopedics;  Laterality: Right;   TUBAL LIGATION       A IV Location/Drains/Wounds Patient Lines/Drains/Airways Status     Active Line/Drains/Airways     None            Intake/Output Last 24 hours No intake or output data in the 24 hours ending 02/02/23 2102  Labs/Imaging Results for orders placed or performed during the hospital encounter of 02/02/23 (from the past 48 hour(s))  Lipase, blood     Status: None   Collection Time: 02/02/23  1:58 PM  Result Value Ref Range   Lipase 39 11 - 51 U/L    Comment: Performed at [facility], [address], [place], [place] [address]  Comprehensive metabolic panel     Status: Abnormal   Collection Time: 02/02/23  1:58 PM  Result Value Ref Range   Sodium 134 (L) 135 - 145 mmol/L   Potassium 3.4 (L) 3.5 - 5.1 mmol/L   Chloride 99 98 - 111 mmol/L   CO2 19 (L) 22 - 32  mmol/L   Glucose, Bld 119 (H) 70 - 99 mg/dL    Comment: Glucose reference range applies only to samples taken after fasting for at least 8 hours.   BUN 26 (H) 8 - 23 mg/dL   Creatinine, Ser 8.65 (H) 0.44 - 1.00 mg/dL   Calcium 8.7 (L) 8.9 - 10.3 mg/dL   Total Protein 6.1 (L) 6.5 - 8.1 g/dL   Albumin 3.1 (L) 3.5 - 5.0 g/dL   AST 13 (L) 15 - 41 U/L   ALT 12 0 - 44 U/L   Alkaline Phosphatase 67 38 - 126 U/L   Total Bilirubin 0.7 0.3 - 1.2 mg/dL   GFR, Estimated 23 (L) >60 mL/min    Comment: (NOTE) Calculated using the CKD-EPI Creatinine Equation (2021)    Anion gap 16 (H) 5 - 15    Comment: ELECTROLYTES REPEATED TO VERIFY Performed at [facility], [address] [place], [place] [address]   CBC     Status: Abnormal   Collection Time: 02/02/23  1:58 PM  Result Value Ref Range   WBC 11.7 (H) 4.0 - 10.5 K/uL   RBC 3.82 (L) 3.87 - 5.11 MIL/uL   Hemoglobin 11.4 (L) 12.0 - 15.0 g/dL   HCT 69.6 29.5 - 28.4 %   MCV 94.8 80.0 - 100.0 fL   MCH 29.8 26.0 - 34.0 pg   MCHC 31.5 30.0 - 36.0 g/dL   RDW 13.2 (H) 44.0 - 10.2 %   Platelets 297 150 - 400 K/uL   nRBC 0.0 0.0 - 0.2 %    Comment: Performed at [facility], [address] [place], [place] [address]  Troponin I (High Sensitivity)     Status: None   Collection Time: 02/02/23  1:58 PM  Result Value Ref Range   Troponin I (High Sensitivity) 11 <18 ng/L    Comment: (NOTE) Elevated high sensitivity troponin I (hsTnI) values and significant  changes across serial measurements may suggest ACS but many other  chronic and acute conditions are known to elevate hsTnI results.  Refer to the "Links" section for chest pain algorithms and additional  guidance. Performed at [facility], [address] [place], [place] [address]   Troponin I (High Sensitivity)     Status: None   Collection Time: 02/02/23  6:50 PM  Result Value Ref Range   Troponin I (High Sensitivity) 10 <18 ng/L    Comment: (NOTE) Elevated high  sensitivity troponin I (hsTnI) values and significant  changes across serial measurements may suggest ACS but many other  chronic and acute conditions are known to elevate hsTnI results.  Refer to the "Links" section for chest pain algorithms and additional  guidance. Performed at [facility], [address] [place], [place] [address]   Magnesium     Status: Abnormal   Collection Time: 02/02/23  6:50 PM  Result Value Ref Range   Magnesium 1.5 (L) 1.7 - 2.4 mg/dL    Comment: Performed at [facility], [address] [place], [place] [address]   CT ABDOMEN PELVIS WO CONTRAST  Result Date: 02/02/2023 CLINICAL DATA:  Acute abdominal pain EXAM: CT ABDOMEN AND PELVIS WITHOUT CONTRAST TECHNIQUE: Multidetector CT imaging of the abdomen and pelvis was performed following the standard protocol without IV contrast. RADIATION DOSE REDUCTION: This exam was performed according to the departmental dose-optimization program which includes automated exposure control, adjustment of the mA and/or kV according to patient size and/or use of iterative reconstruction technique. COMPARISON:  10/26/2020 FINDINGS: Lower chest: Lung bases are free of acute infiltrate or sizable effusion. Hepatobiliary: No focal liver abnormality is seen. Status post cholecystectomy. No biliary dilatation. Pancreas: Unremarkable. No pancreatic ductal dilatation or surrounding inflammatory changes. Spleen: Normal in size without focal abnormality. Adrenals/Urinary Tract: Adrenal glands are within normal limits. Kidneys show no renal calculi or obstructive changes. The bladder is decompressed. Stomach/Bowel: Scattered diverticular change of the colon is noted. No diverticulitis is seen. The appendix is not well visualized and likely surgically removed. No inflammatory changes are noted. Small bowel is within normal limits. Stomach shows a gastric lap band in place. Phi angle measures 58 degrees at the upper limits of normal.  Correlation is recommended. Vascular/Lymphatic: Aortic atherosclerosis. No enlarged abdominal or pelvic lymph nodes. Reproductive: Uterus and bilateral adnexa are unremarkable. Other: No abdominal wall hernia or abnormality. No abdominopelvic ascites. Musculoskeletal: No acute  or significant osseous findings. Chronic compression deformities involving L2, L3 and L4 are noted. IMPRESSION: Diverticulosis without diverticulitis. No acute abnormality is noted. Gastric lap band is noted with the phi angle at the upper limits of normal. Clinical correlation is recommended Electronically Signed   By: Alcide Clever M.D.   On: 02/02/2023 19:27   DG Chest 2 View  Result Date: 02/02/2023 CLINICAL DATA:  Shortness of breath.  Chest pain. EXAM: CHEST - 2 VIEW COMPARISON:  Two-view chest x-ray 10/14/2022 FINDINGS: Heart is enlarged. Atherosclerotic calcifications are present at the aortic arch. Mild pulmonary vascular congestion is present. No focal airspace disease present. No effusions are present. IMPRESSION: Cardiomegaly and mild pulmonary vascular congestion without failure. Electronically Signed   By: Marin Roberts M.D.   On: 02/02/2023 15:29    Pending Labs Unresulted Labs (From admission, onward)     Start     Ordered   02/03/23 0500  Basic metabolic panel  Daily,   R      02/02/23 2009   02/03/23 0500  Magnesium  Tomorrow morning,   R        02/02/23 2009   02/03/23 0500  CBC  Daily,   R      02/02/23 2009   02/02/23 2010  Gastrointestinal Panel by PCR , Stool  (Gastrointestinal Panel by PCR, Stool                                                                                                                                                     **Does Not include CLOSTRIDIUM DIFFICILE testing. **If CDIFF testing is needed, place order from the "C Difficile Testing" order set.**)  Once,   R        02/02/23 2009   02/02/23 2010  C Difficile Quick Screen w PCR reflex  (C Difficile quick screen w PCR  reflex panel )  Once, for 24 hours,   TIMED       References:    CDiff Information Tool   02/02/23 2009   02/02/23 2005  Creatinine, urine, random  Once,   R        02/02/23 2009   02/02/23 2005  Urea nitrogen, urine  Once,   R        02/02/23 2009   02/02/23 2004  Urinalysis, Complete w Microscopic -Urine, Clean Catch  Once,   R       Question:  Specimen Source  Answer:  Urine, Clean Catch   02/02/23 2009   02/02/23 2004  Sodium, urine, random  Once,   R        02/02/23 2009            Vitals/Pain Today's Vitals   02/02/23 1350 02/02/23 1355 02/02/23 1356 02/02/23 1929  BP: 127/72   112/62  Pulse: 83  91  Resp: 20   17  Temp: 98 F (36.7 C)   98 F (36.7 C)  TempSrc: Oral   Oral  SpO2: 100%   100%  Weight:   104.3 kg   Height:   5\' 3"  (1.6 m)   PainSc:  7       Isolation Precautions Enteric precautions (UV disinfection)  Medications Medications  ondansetron (ZOFRAN) injection 4 mg (has no administration in time range)  lactated ringers infusion (has no administration in time range)  lactated ringers bolus 500 mL (has no administration in time range)  allopurinol (ZYLOPRIM) tablet 400 mg (has no administration in time range)  aspirin EC tablet 81 mg (has no administration in time range)  hydroxychloroquine (PLAQUENIL) tablet 200 mg (has no administration in time range)  atorvastatin (LIPITOR) tablet 40 mg (has no administration in time range)  metoprolol tartrate (LOPRESSOR) tablet 75 mg (has no administration in time range)  zolpidem (AMBIEN) tablet 5 mg (has no administration in time range)  apixaban (ELIQUIS) tablet 5 mg (has no administration in time range)  Potassium Chloride ER TBCR 10 mEq (has no administration in time range)  sodium chloride flush (NS) 0.9 % injection 3 mL (has no administration in time range)  acetaminophen (TYLENOL) tablet 650 mg (has no administration in time range)    Or  acetaminophen (TYLENOL) suppository 650 mg (has no  administration in time range)  oxyCODONE (Oxy IR/ROXICODONE) immediate release tablet 5 mg (has no administration in time range)  prochlorperazine (COMPAZINE) injection 5 mg (has no administration in time range)  loperamide (IMODIUM) capsule 2 mg (has no administration in time range)  magnesium sulfate IVPB 2 g 50 mL (has no administration in time range)  dicyclomine (BENTYL) capsule 20 mg (20 mg Oral Given 02/02/23 2100)    Mobility power wheelchair     Focused Assessments G/U   R Recommendations: See Admitting Provider Note  Report given to:   Additional Notes: Waiting on IV team to place a line. Husband took power wheel chair home will bring back if needed.

## 2023-02-02 NOTE — Progress Notes (Signed)
New Admission Note:    Arrival Method: ED stretcher Mental Orientation: a/ox4 Telemetry: box 9 Assessment:completed Skin: intact IV: left forearm Pain:n/a Tubes: n/a Safety Measures: nonskid socks Admission: completed Orientation: Patient has been oriented to the room, unit and staff.  Family: husband Belongings: kept at bedside  Orders have been reviewed and implemented. Will continue to monitor the patient. Call light has been placed within reach and bed alarm has been activated.   Fabian Sharp BSN, RN-BC Phone number: [phone]

## 2023-02-03 DIAGNOSIS — N179 Acute kidney failure, unspecified: Secondary | ICD-10-CM | POA: Diagnosis not present

## 2023-02-03 LAB — GASTROINTESTINAL PANEL BY PCR, STOOL (REPLACES STOOL CULTURE)
Adenovirus F40/41: NOT DETECTED
Astrovirus: NOT DETECTED
Campylobacter species: NOT DETECTED
Cyclospora cayetanensis: NOT DETECTED
Entamoeba histolytica: NOT DETECTED
Enteroaggregative E coli (EAEC): NOT DETECTED
Enteropathogenic E coli (EPEC): DETECTED — AB
Norovirus GI/GII: NOT DETECTED
Plesimonas shigelloides: NOT DETECTED
Rotavirus A: NOT DETECTED
Salmonella species: NOT DETECTED
Sapovirus (I, II, IV, and V): NOT DETECTED
Shigella/Enteroinvasive E coli (EIEC): NOT DETECTED
Vibrio species: NOT DETECTED
Yersinia enterocolitica: NOT DETECTED

## 2023-02-03 LAB — BASIC METABOLIC PANEL
Anion gap: 14 (ref 5–15)
BUN: 26 mg/dL — ABNORMAL HIGH (ref 8–23)
CO2: 20 mmol/L — ABNORMAL LOW (ref 22–32)
Calcium: 8.1 mg/dL — ABNORMAL LOW (ref 8.9–10.3)
Chloride: 97 mmol/L — ABNORMAL LOW (ref 98–111)
Creatinine, Ser: 1.99 mg/dL — ABNORMAL HIGH (ref 0.44–1.00)
GFR, Estimated: 26 mL/min — ABNORMAL LOW (ref >60)
Glucose, Bld: 120 mg/dL — ABNORMAL HIGH (ref 70–99)
Potassium: 3.2 mmol/L — ABNORMAL LOW (ref 3.5–5.1)
Sodium: 131 mmol/L — ABNORMAL LOW (ref 135–145)

## 2023-02-03 LAB — CBC
HCT: 30.9 % — ABNORMAL LOW (ref 36.0–46.0)
Hemoglobin: 10.1 g/dL — ABNORMAL LOW (ref 12.0–15.0)
MCHC: 32.7 g/dL (ref 30.0–36.0)
MCV: 92.2 fL (ref 80.0–100.0)
Platelets: 248 10*3/uL (ref 150–400)
RBC: 3.35 MIL/uL — ABNORMAL LOW (ref 3.87–5.11)
RDW: 16.6 % — ABNORMAL HIGH (ref 11.5–15.5)
nRBC: 0 % (ref 0.0–0.2)

## 2023-02-03 LAB — C DIFFICILE QUICK SCREEN W PCR REFLEX
C Diff antigen: NEGATIVE
C Diff interpretation: NOT DETECTED
C Diff toxin: NEGATIVE

## 2023-02-03 LAB — MAGNESIUM: Magnesium: 1.9 mg/dL (ref 1.7–2.4)

## 2023-02-03 MED ORDER — AZITHROMYCIN 500 MG PO TABS
500.0000 mg | ORAL_TABLET | Freq: Every day | ORAL | Status: DC
  Administered 2023-02-03 – 2023-02-05 (×3): 500 mg via ORAL
  Filled 2023-02-03 (×3): qty 1

## 2023-02-03 MED ORDER — POTASSIUM CHLORIDE CRYS ER 20 MEQ PO TBCR
40.0000 meq | EXTENDED_RELEASE_TABLET | ORAL | Status: AC
  Administered 2023-02-03 (×2): 40 meq via ORAL
  Filled 2023-02-03 (×2): qty 2

## 2023-02-03 NOTE — Discharge Instructions (Signed)

## 2023-02-03 NOTE — Plan of Care (Signed)

## 2023-02-03 NOTE — Consult Note (Signed)
Reason for Consult: Diarrhea x 6 months. Referring Physician: [facility]  Willaim Rayas HPI: This is a 72 year old female with a PMH of CAD, osteoporosis, HFpEF, RA, obesity, HTN, and hyperlipidemia admitted for complaints of diarrhea.  She states that her diarrhea started 6 months ago, but it worsened this past month.  For the majority of the time she thought it was from diverticulitis or that it would go away.  On average she had 3 loose bowel movements per day, but starting 4 weeks ago she noticed an increase in her stooling.  It was to the point that she was not able to venture outside of her some.  As more abdominal pain set in and the bowel movements increased to 8-10 per day, she presented to the ER.  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  She did not report any fever and no known sick contacts.  Past Medical History:  Diagnosis Date   CAD (coronary artery disease)    a. PTCA LAD 2001/cath 2002-prox and mid LAD stents patent, PTCA ostium Dx 1 b. cath 03/2016: patent LAD stent with less than 40% in-stent restenosis, 10-30% stenosis of D1 and distal LAD with continued medical management recommended. 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    Past Surgical History:  Procedure Laterality Date   CARDIAC CATHETERIZATION     with  stent placement in 2001   CARDIAC CATHETERIZATION N/A 03/12/2016   Procedure: Left Heart Cath and Coronary Angiography;  Surgeon: Yvonne Kendall, MD;  Location: [facility] INVASIVE CV LAB;  Service: Cardiovascular;  Laterality: N/A;   CARDIAC CATHETERIZATION N/A 03/12/2016   Procedure: Intravascular Pressure Wire/FFR Study;  Surgeon: Yvonne Kendall, MD;  Location: [facility] INVASIVE CV LAB;  Service: Cardiovascular;  Laterality: N/A;   CHOLECYSTECTOMY     CORONARY ANGIOPLASTY     2002   ESOPHAGOGASTRODUODENOSCOPY N/A 12/03/2016   Procedure: ESOPHAGOGASTRODUODENOSCOPY (EGD);  Surgeon: Jeani Hawking, MD;  Location: [facility] ENDOSCOPY;  Service: Endoscopy;  Laterality: N/A;   laproscopic adjustable gastric  banding     with APS standard system.    OPEN REDUCTION INTERNAL FIXATION (ORIF) DISTAL RADIAL FRACTURE Right 05/15/2018   Procedure: OPEN REDUCTION INTERNAL FIXATION (ORIF) DISTAL RADIAL FRACTURE;  Surgeon: Roby Lofts, MD;  Location: [facility] OR;  Service: Orthopedics;  Laterality: Right;   ORIF ANKLE FRACTURE Right 05/15/2018   Procedure: OPEN REDUCTION INTERNAL FIXATION (ORIF) ANKLE FRACTURE;  Surgeon: Roby Lofts, MD;  Location: [facility] OR;  Service: Orthopedics;  Laterality: Right;   ORIF TIBIA PLATEAU Right 05/15/2018   Procedure: OPEN REDUCTION INTERNAL FIXATION (ORIF) TIBIAL PLATEAU;  Surgeon: Roby Lofts, MD;  Location: [facility] OR;  Service: Orthopedics;  Laterality: Right;   Pt has  3 stents     sept 2001   SHOULDER ARTHROSCOPY WITH SUBACROMIAL DECOMPRESSION, ROTATOR CUFF REPAIR AND BICEP TENDON REPAIR Right 11/16/2013   Procedure: RIGHT SHOULDER ARTHROSCOPY, EXTENSIVE DEBRIDEMENT, ROTATOR CUFF REPAIR ;  Surgeon: Eulas Post, MD;  Location: [facility];  Service: Orthopedics;  Laterality: Right;   TUBAL LIGATION      Family History  Problem Relation Age of Onset   Heart attack Mother    Heart disease Mother    Arthritis/Rheumatoid Father    Cirrhosis Father        hepatic cirrhosis    Breast cancer Sister 76   Diabetes Sister    Heart disease Maternal Aunt    Heart attack Maternal Aunt    Throat cancer Maternal Aunt    Heart attack Maternal Uncle    Colon cancer Maternal Grandmother    Throat cancer Maternal Grandfather    Diabetes Brother    Heart disease Brother    Heart disease Brother    Cancer Other        family history   Heart attack Other        family history    Social History:  reports that she has never smoked. She has never used smokeless tobacco. She reports that she does not drink alcohol and does not use drugs.  Allergies:  Allergies  Allergen Reactions   Codeine Phosphate Shortness Of Breath   Morphine And Codeine Nausea And Vomiting   Amoxicillin Nausea And Vomiting   Penicillins Nausea And Vomiting    Has patient had a PCN reaction causing immediate rash, facial/tongue/throat swelling, SOB or lightheadedness with hypotension:YES Has patient had a PCN reaction causing severe rash involving mucus membranes or skin necrosis: NO Has patient had a PCN reaction that required hospitalization NO Has patient had a PCN reaction occurring within the last 10 years: NO If all of the above answers are "NO", then may proceed with Cephalosporin use.   Methotrexate Nausea Only   Lidocaine Rash    Reaction to lidocaine patch    Medications: Scheduled:  allopurinol  400 mg Oral QHS   aspirin EC  81 mg Oral QHS   atorvastatin  40 mg Oral Daily   azithromycin  500 mg Oral Daily   hydroxychloroquine  200 mg Oral BID   metoprolol tartrate  75 mg Oral BID   potassium chloride  10 mEq Oral Daily   sodium chloride flush  3 mL Intravenous Q12H   Continuous:  Results for orders placed or performed during the hospital encounter of 02/02/23 (from the past 24 hour(s))  Troponin I (High Sensitivity)     Status: None   Collection Time: 02/02/23  6:50 PM  Result Value Ref Range   Troponin I (High Sensitivity) 10 <18 ng/L  Magnesium     Status: Abnormal    Collection Time: 02/02/23  6:50 PM  Result Value Ref Range   Magnesium 1.5 (L) 1.7 - 2.4 mg/dL  Gastrointestinal Panel by PCR , Stool     Status: Abnormal   Collection Time: 02/02/23  8:10 PM   Specimen: STOOL  Result Value Ref Range   Campylobacter species NOT DETECTED NOT DETECTED   Plesimonas shigelloides NOT DETECTED NOT DETECTED   Salmonella species NOT DETECTED NOT DETECTED   Yersinia enterocolitica NOT DETECTED NOT DETECTED   Vibrio species NOT DETECTED NOT DETECTED   Vibrio cholerae NOT DETECTED NOT DETECTED   Enteroaggregative E coli (EAEC) NOT DETECTED NOT DETECTED  Enteropathogenic E coli (EPEC) DETECTED (A) NOT DETECTED   Enterotoxigenic E coli (ETEC) NOT DETECTED NOT DETECTED   Shiga like toxin producing E coli (STEC) NOT DETECTED NOT DETECTED   Shigella/Enteroinvasive E coli (EIEC) NOT DETECTED NOT DETECTED   Cryptosporidium NOT DETECTED NOT DETECTED   Cyclospora cayetanensis NOT DETECTED NOT DETECTED   Entamoeba histolytica NOT DETECTED NOT DETECTED   Giardia lamblia NOT DETECTED NOT DETECTED   Adenovirus F40/41 NOT DETECTED NOT DETECTED   Astrovirus NOT DETECTED NOT DETECTED   Norovirus GI/GII NOT DETECTED NOT DETECTED   Rotavirus A NOT DETECTED NOT DETECTED   Sapovirus (I, II, IV, and V) NOT DETECTED NOT DETECTED  C Difficile Quick Screen w PCR reflex     Status: None   Collection Time: 02/03/23  1:00 AM  Result Value Ref Range   C Diff antigen NEGATIVE NEGATIVE   C Diff toxin NEGATIVE NEGATIVE   C Diff interpretation No C. difficile detected.   Basic metabolic panel     Status: Abnormal   Collection Time: 02/03/23  2:22 AM  Result Value Ref Range   Sodium 131 (L) 135 - 145 mmol/L   Potassium 3.2 (L) 3.5 - 5.1 mmol/L   Chloride 97 (L) 98 - 111 mmol/L   CO2 20 (L) 22 - 32 mmol/L   Glucose, Bld 120 (H) 70 - 99 mg/dL   BUN 26 (H) 8 - 23 mg/dL   Creatinine, Ser 6.21 (H) 0.44 - 1.00 mg/dL   Calcium 8.1 (L) 8.9 - 10.3 mg/dL   GFR, Estimated 26 (L) >60 mL/min    Anion gap 14 5 - 15  Magnesium     Status: None   Collection Time: 02/03/23  2:22 AM  Result Value Ref Range   Magnesium 1.9 1.7 - 2.4 mg/dL  CBC     Status: Abnormal   Collection Time: 02/03/23  2:22 AM  Result Value Ref Range   WBC 10.2 4.0 - 10.5 K/uL   RBC 3.35 (L) 3.87 - 5.11 MIL/uL   Hemoglobin 10.1 (L) 12.0 - 15.0 g/dL   HCT 30.8 (L) 65.7 - 84.6 %   MCV 92.2 80.0 - 100.0 fL   MCH 30.1 26.0 - 34.0 pg   MCHC 32.7 30.0 - 36.0 g/dL   RDW 96.2 (H) 95.2 - 84.1 %   Platelets 248 150 - 400 K/uL   nRBC 0.0 0.0 - 0.2 %     CT ABDOMEN PELVIS WO CONTRAST  Result Date: 02/02/2023 CLINICAL DATA:  Acute abdominal pain EXAM: CT ABDOMEN AND PELVIS WITHOUT CONTRAST TECHNIQUE: Multidetector CT imaging of the abdomen and pelvis was performed following the standard protocol without IV contrast. RADIATION DOSE REDUCTION: This exam was performed according to the departmental dose-optimization program which includes automated exposure control, adjustment of the mA and/or kV according to patient size and/or use of iterative reconstruction technique. COMPARISON:  10/26/2020 FINDINGS: Lower chest: Lung bases are free of acute infiltrate or sizable effusion. Hepatobiliary: No focal liver abnormality is seen. Status post cholecystectomy. No biliary dilatation. Pancreas: Unremarkable. No pancreatic ductal dilatation or surrounding inflammatory changes. Spleen: Normal in size without focal abnormality. Adrenals/Urinary Tract: Adrenal glands are within normal limits. Kidneys show no renal calculi or obstructive changes. The bladder is decompressed. Stomach/Bowel: Scattered diverticular change of the colon is noted. No diverticulitis is seen. The appendix is not well visualized and likely surgically removed. No inflammatory changes are noted. Small bowel is within normal limits. Stomach shows a gastric lap band in place.  Phi angle measures 58 degrees at the upper limits of normal. Correlation is recommended.  Vascular/Lymphatic: Aortic atherosclerosis. No enlarged abdominal or pelvic lymph nodes. Reproductive: Uterus and bilateral adnexa are unremarkable. Other: No abdominal wall hernia or abnormality. No abdominopelvic ascites. Musculoskeletal: No acute or significant osseous findings. Chronic compression deformities involving L2, L3 and L4 are noted. IMPRESSION: Diverticulosis without diverticulitis. No acute abnormality is noted. Gastric lap band is noted with the phi angle at the upper limits of normal. Clinical correlation is recommended Electronically Signed   By: Alcide Clever M.D.   On: 02/02/2023 19:27   DG Chest 2 View  Result Date: 02/02/2023 CLINICAL DATA:  Shortness of breath.  Chest pain. EXAM: CHEST - 2 VIEW COMPARISON:  Two-view chest x-ray 10/14/2022 FINDINGS: Heart is enlarged. Atherosclerotic calcifications are present at the aortic arch. Mild pulmonary vascular congestion is present. No focal airspace disease present. No effusions are present. IMPRESSION: Cardiomegaly and mild pulmonary vascular congestion without failure. Electronically Signed   By: Marin Roberts M.D.   On: 02/02/2023 15:29    ROS:  As stated above in the HPI otherwise negative.  Blood pressure (!) 116/50, pulse 82, temperature 98.7 F (37.1 C), temperature source Oral, resp. rate 17, height 5\' 3"  (1.6 m), weight 104.3 kg, SpO2 97%.    PE: Gen: NAD, Alert and Oriented HEENT:  [place]/AT, EOMI Neck: Supple, no LAD Lungs: CTA Bilaterally CV: RRR without M/G/R ABD: Soft, NTND, +BS Ext: No C/C/E  Assessment/Plan: 1) EPEC diarrhea. 2) Abdominal pain. 3) CHF.   It not clear if she truly had diarrhea for the past 6 months, however, it is clear that she had significant diarrhea for the past month.  This can be consistent with the EPEC infection, ie, the diarrhea this past month.  She was offered a colonoscopy for tomorrow or to wait and see if her symptoms improve with azithromycin.  She does not feel that she can  prep for a colonoscopy and she wants to wait.  Plan: 1) Agree with azithromycin. 2) IV hydration. 3) ? Colonoscopy this weekend.  Talyn Eddie D 02/03/2023, 5:32 PM

## 2023-02-03 NOTE — TOC CM/SW Note (Signed)
Transition of Care [facility]) - Inpatient Brief Assessment   Patient Details  Name: Cindy Holmes MRN: [id number] Date of Birth: 09/27/1950  Transition of [facility]) CM/SW Contact:    Tom-Johnson, Hershal Coria, RN Phone Number: 02/03/2023, 4:02 PM   Clinical Narrative:  Patient presented ton the ED with Abdominal pain, Diarrhea, and Fatigue with Lightheadedness. Stool PCR positive for Enteropathogenic E. Coli, GI following. On IV abx. Eliquis on hold for hx of A-fib.   From home with husband, Molly Maduro. Has three supportive children. Retired. States she is having difficulty with mobilization with frequent falls at home, back and right side pains. Requesting a hospital bead and hoyer lift at discharge, MD notified.    Has a motorized wheelchair, manual wheelchair, walker, rollator and transfer bench at home.  PCP is [name], MD and uses [facility] on [address] in [place] and also [facility].   Awaits PT/OT eval for disposition.  CM will continue to follow as patient progresses with care towards discharge.           Transition of Care Asessment: Insurance and Status: Insurance coverage has been reviewed Patient has primary care physician: Yes Home environment has been reviewed: Yes Prior level of function:: Modified assitance with motorized wheelchair. Prior/Current Home Services: No current home services Social Determinants of Health Reivew: SDOH reviewed no interventions necessary Readmission risk has been reviewed: Yes Transition of care needs: transition of care needs identified, TOC will continue to follow (Requesting hospital bed and hoyer lift.)

## 2023-02-03 NOTE — Progress Notes (Signed)
Ok to replace K 3.2 with Kdur q4h x2 per Dr Jerral Ralph.  Ulyses Southward, PharmD, BCIDP, AAHIVP, CPP Infectious Disease Pharmacist 02/03/2023 8:43 AM

## 2023-02-03 NOTE — Progress Notes (Signed)
PROGRESS NOTE    Cindy Holmes  ZOX:[id number] DOB: 1951-04-11 DOA: 02/02/2023 PCP: [name], MD    Brief Narrative:  72 year old with history of hypertension, rheumatoid arthritis, coronary artery disease, chronic diastolic dysfunction, history of a stroke who presents to the hospital with extreme weakness, ongoing diarrhea and abdominal cramping that is ongoing for more than 6 months but significantly worse for last 2 weeks.  She explains about 10 episodes of diarrhea per day but loose and watery.  Occasionally noticed fatty globules.  Tried multiple medications at home without help.  In the emergency room she was afebrile.  On room air.  CT scan abdomen pelvis with no acute findings.  Creatinine 2.27 with recent normal creatinine.  She was given IV fluids and admitted to the hospital. Her stool exam came back positive for enteropathogenic E. coli.   Assessment & Plan:   Acute on chronic diarrhea: Ongoing diarrhea for 6 months.  Worse diarrhea for 2 weeks.  Causing acute kidney injury and hospitalization. Stool C. difficile negative Stool PCR positive for enteropathogenic E. Coli Severe symptomatic disease, will treat with azithromycin for 3 days. Will check stool for fat. Chronic uninvestigated diarrhea, may benefit with follow-up colonoscopy.  GI consulted.  Can use Imodium.  Fluid overloaded today, hold maintenance IV fluids.  Acute kidney injury: Recent normal known creatinine.  Serum creatinine 2.27.  No evidence of obstructive pathology on CT scan.  Treated with IV fluids overnight.  This is likely multifactorial. Hold diuresis, hold ACE inhibitor.  Check output.  Recheck tomorrow morning.  Chronic medical issues including Coronary artery disease, no anginal symptoms.  Continue aspirin, Lipitor and metoprolol. Chronic diastolic heart failure, as above.  Holding ACE inhibitors and diuretics. Paroxysmal A-fib, currently sinus rhythm.  On metoprolol.  Eliquis on  hold. Arthritis, managed with Plaquenil and methotrexate. History of stroke, on aspirin, Lipitor and Eliquis.  Holding Eliquis. Hypokalemia and hypomagnesemia, persistent.  Replace potassium further.  Magnesium is adequate.   DVT prophylaxis: SCDs.   Code Status: Full code Family Communication: None at the bedside Disposition Plan: Status is: Inpatient Remains inpatient appropriate because: Significant diarrhea, inpatient procedure planned     Consultants:  Gastroenterology, Dr. Elnoria Howard  Procedures:  None  Antimicrobials:  Azithromycin since 7/25--   Subjective: Patient seen and examined.  She is appropriately anxious.  She had loose stool on her bedside commode that was not cleaned.  Patient denies any nausea vomiting.  She is very distressed with ongoing diarrhea and also issues with her kidneys.  Denies any sick contacts or exposure.  Lives at home with husband.  Objective: Vitals:   02/02/23 2158 02/03/23 0200 02/03/23 0515 02/03/23 0945  BP: (!) 132/98 (!) 129/48 (!) 141/44 (!) 135/50  Pulse: (!) 102 99 100 93  Resp: 16 16 16 17   Temp: 98.2 F (36.8 C) 99.2 F (37.3 C) 98.4 F (36.9 C) 98.4 F (36.9 C)  TempSrc: Oral Oral Oral   SpO2: 100% 96% 97% 97%  Weight:      Height:        Intake/Output Summary (Last 24 hours) at 02/03/2023 1153 Last data filed at 02/03/2023 0800 Gross per 24 hour  Intake 567.52 ml  Output 0 ml  Net 567.52 ml   Filed Weights   02/02/23 1356  Weight: 104.3 kg    Examination:  General exam: Appears calm and comfortable at rest. She is slightly anxious. Patient is alert awake and oriented.  No focal neurological deficits.  She has  generalized weakness. Respiratory system: Clear to auscultation. Respiratory effort normal. Cardiovascular system: S1 & S2 heard, RRR. No JVD, murmurs, rubs, gallops or clicks.  Patient has 1+ edema of all extremities. Gastrointestinal system: Abdomen is nondistended, soft and nontender. No organomegaly  or masses felt. Normal bowel sounds heard. Central nervous system: Alert and oriented. No focal neurological deficits.    Data Reviewed: I have personally reviewed following labs and imaging studies  CBC: Recent Labs  Lab 02/02/23 1358 02/03/23 0222  WBC 11.7* 10.2  HGB 11.4* 10.1*  HCT 36.2 30.9*  MCV 94.8 92.2  PLT 297 248   Basic Metabolic Panel: Recent Labs  Lab 02/02/23 1358 02/02/23 1850 02/03/23 0222  NA 134*  --  131*  K 3.4*  --  3.2*  CL 99  --  97*  CO2 19*  --  20*  GLUCOSE 119*  --  120*  BUN 26*  --  26*  CREATININE 2.27*  --  1.99*  CALCIUM 8.7*  --  8.1*  MG  --  1.5* 1.9   GFR: Estimated Creatinine Clearance: 30 mL/min (A) (by C-G formula based on SCr of 1.99 mg/dL (H)). Liver Function Tests: Recent Labs  Lab 02/02/23 1358  AST 13*  ALT 12  ALKPHOS 67  BILITOT 0.7  PROT 6.1*  ALBUMIN 3.1*   Recent Labs  Lab 02/02/23 1358  LIPASE 39   No results for input(s): "AMMONIA" in the last 168 hours. Coagulation Profile: No results for input(s): "INR", "PROTIME" in the last 168 hours. Cardiac Enzymes: No results for input(s): "CKTOTAL", "CKMB", "CKMBINDEX", "TROPONINI" in the last 168 hours. BNP (last 3 results) Recent Labs    05/19/22 1204  PROBNP 380*   HbA1C: No results for input(s): "HGBA1C" in the last 72 hours. CBG: No results for input(s): "GLUCAP" in the last 168 hours. Lipid Profile: No results for input(s): "CHOL", "HDL", "LDLCALC", "TRIG", "CHOLHDL", "LDLDIRECT" in the last 72 hours. Thyroid Function Tests: No results for input(s): "TSH", "T4TOTAL", "FREET4", "T3FREE", "THYROIDAB" in the last 72 hours. Anemia Panel: No results for input(s): "VITAMINB12", "FOLATE", "FERRITIN", "TIBC", "IRON", "RETICCTPCT" in the last 72 hours. Sepsis Labs: No results for input(s): "PROCALCITON", "LATICACIDVEN" in the last 168 hours.  Recent Results (from the past 240 hour(s))  Gastrointestinal Panel by PCR , Stool     Status: Abnormal    Collection Time: 02/02/23  8:10 PM   Specimen: STOOL  Result Value Ref Range Status   Campylobacter species NOT DETECTED NOT DETECTED Final   Plesimonas shigelloides NOT DETECTED NOT DETECTED Final   Salmonella species NOT DETECTED NOT DETECTED Final   Yersinia enterocolitica NOT DETECTED NOT DETECTED Final   Vibrio species NOT DETECTED NOT DETECTED Final   Vibrio cholerae NOT DETECTED NOT DETECTED Final   Enteroaggregative E coli (EAEC) NOT DETECTED NOT DETECTED Final   Enteropathogenic E coli (EPEC) DETECTED (A) NOT DETECTED Final    Comment: RESULT CALLED TO, READ BACK BY AND VERIFIED WITH: ALLEN [facility] 02/03/23 1048 KLW    Enterotoxigenic E coli (ETEC) NOT DETECTED NOT DETECTED Final   Shiga like toxin producing E coli (STEC) NOT DETECTED NOT DETECTED Final   Shigella/Enteroinvasive E coli (EIEC) NOT DETECTED NOT DETECTED Final   Cryptosporidium NOT DETECTED NOT DETECTED Final   Cyclospora cayetanensis NOT DETECTED NOT DETECTED Final   Entamoeba histolytica NOT DETECTED NOT DETECTED Final   Giardia lamblia NOT DETECTED NOT DETECTED Final   Adenovirus F40/41 NOT DETECTED NOT DETECTED Final   Astrovirus NOT  DETECTED NOT DETECTED Final   Norovirus GI/GII NOT DETECTED NOT DETECTED Final   Rotavirus A NOT DETECTED NOT DETECTED Final   Sapovirus (I, II, IV, and V) NOT DETECTED NOT DETECTED Final    Comment: Performed at [facility], [address], [place], [place] [address]  C Difficile Quick Screen w PCR reflex     Status: None   Collection Time: 02/03/23  1:00 AM  Result Value Ref Range Status   C Diff antigen NEGATIVE NEGATIVE Final   C Diff toxin NEGATIVE NEGATIVE Final   C Diff interpretation No C. difficile detected.  Final    Comment: Performed at [facility], [address] [place], [place] [address]         Radiology Studies: CT ABDOMEN PELVIS WO CONTRAST  Result Date: 02/02/2023 CLINICAL DATA:  Acute abdominal pain EXAM: CT ABDOMEN AND PELVIS  WITHOUT CONTRAST TECHNIQUE: Multidetector CT imaging of the abdomen and pelvis was performed following the standard protocol without IV contrast. RADIATION DOSE REDUCTION: This exam was performed according to the departmental dose-optimization program which includes automated exposure control, adjustment of the mA and/or kV according to patient size and/or use of iterative reconstruction technique. COMPARISON:  10/26/2020 FINDINGS: Lower chest: Lung bases are free of acute infiltrate or sizable effusion. Hepatobiliary: No focal liver abnormality is seen. Status post cholecystectomy. No biliary dilatation. Pancreas: Unremarkable. No pancreatic ductal dilatation or surrounding inflammatory changes. Spleen: Normal in size without focal abnormality. Adrenals/Urinary Tract: Adrenal glands are within normal limits. Kidneys show no renal calculi or obstructive changes. The bladder is decompressed. Stomach/Bowel: Scattered diverticular change of the colon is noted. No diverticulitis is seen. The appendix is not well visualized and likely surgically removed. No inflammatory changes are noted. Small bowel is within normal limits. Stomach shows a gastric lap band in place. Phi angle measures 58 degrees at the upper limits of normal. Correlation is recommended. Vascular/Lymphatic: Aortic atherosclerosis. No enlarged abdominal or pelvic lymph nodes. Reproductive: Uterus and bilateral adnexa are unremarkable. Other: No abdominal wall hernia or abnormality. No abdominopelvic ascites. Musculoskeletal: No acute or significant osseous findings. Chronic compression deformities involving L2, L3 and L4 are noted. IMPRESSION: Diverticulosis without diverticulitis. No acute abnormality is noted. Gastric lap band is noted with the phi angle at the upper limits of normal. Clinical correlation is recommended Electronically Signed   By: Alcide Clever M.D.   On: 02/02/2023 19:27   DG Chest 2 View  Result Date: 02/02/2023 CLINICAL DATA:   Shortness of breath.  Chest pain. EXAM: CHEST - 2 VIEW COMPARISON:  Two-view chest x-ray 10/14/2022 FINDINGS: Heart is enlarged. Atherosclerotic calcifications are present at the aortic arch. Mild pulmonary vascular congestion is present. No focal airspace disease present. No effusions are present. IMPRESSION: Cardiomegaly and mild pulmonary vascular congestion without failure. Electronically Signed   By: Marin Roberts M.D.   On: 02/02/2023 15:29        Scheduled Meds:  allopurinol  400 mg Oral QHS   aspirin EC  81 mg Oral QHS   atorvastatin  40 mg Oral Daily   azithromycin  500 mg Oral Daily   hydroxychloroquine  200 mg Oral BID   metoprolol tartrate  75 mg Oral BID   potassium chloride  10 mEq Oral Daily   potassium chloride  40 mEq Oral Q4H   sodium chloride flush  3 mL Intravenous Q12H   Continuous Infusions:   LOS: 1 day    Time spent: 35 minutes  Dorcas Carrow, MD [facility]

## 2023-02-04 DIAGNOSIS — N179 Acute kidney failure, unspecified: Secondary | ICD-10-CM | POA: Diagnosis not present

## 2023-02-04 MED ORDER — RISAQUAD PO CAPS
2.0000 | ORAL_CAPSULE | Freq: Every day | ORAL | Status: DC
  Administered 2023-02-04 – 2023-02-05 (×2): 2 via ORAL
  Filled 2023-02-04 (×2): qty 2

## 2023-02-04 MED ORDER — METOPROLOL TARTRATE 25 MG PO TABS
25.0000 mg | ORAL_TABLET | Freq: Two times a day (BID) | ORAL | Status: DC
  Administered 2023-02-04 – 2023-02-05 (×2): 25 mg via ORAL
  Filled 2023-02-04 (×2): qty 1

## 2023-02-04 NOTE — Evaluation (Signed)
Physical Therapy Evaluation Patient Details Name: Cindy Holmes MRN: [id number] DOB: 12-Dec-1950 Today's Date: 02/04/2023  History of Present Illness  Pt is a 72 y.o. female presenting with several months of diarrhea, abdominal pain, fatigue, light headedness, and periods of SOB/chest pain. CT abd/pelvis negative. Found to have E. Coli. PMH significant for hypertension, rheumatoid arthritis, osteoarthritis, CAD, chronic HFpEF, and history of CVA.  Clinical Impression  Pt presents to PT with deficits in strength, balance, endurance, gait. Pt has a history of multiple falls and is a limited participant in PT evaluation at this time. Pt performs bed mobility and BSC transfer without physical assistance, then refuses further PT intervention at this time as she feels to tired to participate in PT and feels that she has little hope to progress. Pt and spouse express the desire to have a hoyer lift to allow for safe transfers from the floor for pt and caregiver if she is to fall again. Pt would benefit from home health assessment if agreeable to these services, as they would allow for home modification assessment to potentially reduce falls risk and improve safety. PT signing off as the pt is refusing further acute PT treatment during admission.      Assistance Recommended at Discharge Frequent or constant Supervision/Assistance  If plan is discharge home, recommend the following:  Can travel by private vehicle  A little help with walking and/or transfers;A little help with bathing/dressing/bathroom;Assistance with cooking/housework;Assist for transportation;Help with stairs or ramp for entrance        Equipment Recommendations Other (comment) (hoyer lift)  Recommendations for Other Services       Functional Status Assessment Patient has had a recent decline in their functional status and demonstrates the ability to make significant improvements in function in a reasonable and predictable amount  of time.     Precautions / Restrictions Precautions Precautions: Fall Restrictions Weight Bearing Restrictions: No      Mobility  Bed Mobility Overal bed mobility: Needs Assistance Bed Mobility: Supine to Sit     Supine to sit: Supervision          Transfers Overall transfer level: Needs assistance Equipment used: None Transfers: Sit to/from Stand, Bed to chair/wheelchair/BSC Sit to Stand: Supervision   Step pivot transfers: Supervision       General transfer comment: pt pivots from bed to [facility], refuses further mobility or assessment from PT    Ambulation/Gait                  Stairs            Wheelchair Mobility     Tilt Bed    Modified Rankin (Stroke Patients Only)       Balance Overall balance assessment: Needs assistance Sitting-balance support: No upper extremity supported, Feet supported Sitting balance-Leahy Scale: Good     Standing balance support: Single extremity supported, Reliant on assistive device for balance Standing balance-Leahy Scale: Poor                               Pertinent Vitals/Pain Pain Assessment Pain Assessment: Faces Faces Pain Scale: Hurts little more Pain Location: generalized Pain Descriptors / Indicators: Grimacing Pain Intervention(s): Monitored during session    Home Living Family/patient expects to be discharged to:: Private residence Living Arrangements: Spouse/significant other Available Help at Discharge: Family Type of Home: House Home Access: Stairs to enter   [employer] of Steps: 1 side door  step   Home Layout: One level Home Equipment: Wheelchair - Forensic psychologist (2 wheels);Cane - single point;Tub bench (electric scooter)      Prior Function Prior Level of Function : Needs assist             Mobility Comments: ambulates for very short distances with RW, often utilizing wheelchair. History of many falls ADLs Comments: assistance with tub  transfers and IADLs     Hand Dominance        Extremity/Trunk Assessment   Upper Extremity Assessment Upper Extremity Assessment: Generalized weakness    Lower Extremity Assessment Lower Extremity Assessment: Generalized weakness    Cervical / Trunk Assessment Cervical / Trunk Assessment: Kyphotic  Communication   Communication: No difficulties  Cognition Arousal/Alertness: Awake/alert Behavior During Therapy: WFL for tasks assessed/performed Overall Cognitive Status: Within Functional Limits for tasks assessed                                          General Comments General comments (skin integrity, edema, etc.): VSS on RA. Education provided with spouse on dependent transfers with assistance of multiple individuals and use of bed sheet opposed to lifting pt by limbs. This is only as an alternative if hoyer lift is not able to be purchased.    Exercises     Assessment/Plan    PT Assessment Patient needs continued PT services (however pt refusing further acute PT intervention)  PT Problem List Decreased strength;Decreased activity tolerance;Decreased balance;Decreased mobility       PT Treatment Interventions      PT Goals (Current goals can be found in the Care Plan section)  Acute Rehab PT Goals Patient Stated Goal: pt is not agreeable to further participation with acute PT services    Frequency  (pt refusing further PT intervention)     Co-evaluation PT/OT/SLP Co-Evaluation/Treatment: Yes Reason for Co-Treatment: Complexity of the patient's impairments (multi-system involvement) PT goals addressed during session: Mobility/safety with mobility;Balance;Proper use of DME         AM-PAC PT "6 Clicks" Mobility  Outcome Measure Help needed turning from your back to your side while in a flat bed without using bedrails?: A Little Help needed moving from lying on your back to sitting on the side of a flat bed without using bedrails?: A  Little Help needed moving to and from a bed to a chair (including a wheelchair)?: A Little Help needed standing up from a chair using your arms (e.g., wheelchair or bedside chair)?: A Little Help needed to walk in hospital room?: Total Help needed climbing 3-5 steps with a railing? : Total 6 Click Score: 14    End of Session   Activity Tolerance: Other (comment) (self-limiting, refuses further evaluation other than BSC transfer) Patient left: Other (comment) (left on [facility]) Nurse Communication: Mobility status PT Visit Diagnosis: History of falling (Z91.81);Muscle weakness (generalized) (M62.81)    Time: 1610-9604 PT Time Calculation (min) (ACUTE ONLY): 27 min   Charges:   PT Evaluation $PT Eval Low Complexity: 1 Low   PT General Charges $$ ACUTE PT VISIT: 1 Visit         Arlyss Gandy, PT, DPT Acute Rehabilitation Office [phone]   Arlyss Gandy 02/04/2023, 1:44 PM

## 2023-02-04 NOTE — Evaluation (Signed)
Occupational Therapy Evaluation and DISCHARGE   Patient Details Name: Cindy Holmes MRN: [id number] DOB: Jan 08, 1951 Today's Date: 02/04/2023   History of Present Illness Pt is a 72 y.o. female presenting with several months of diarrhea, abdominal pain, fatigue, light headedness, and periods of SOB/chest pain. CT abd/pelvis negative. Found to have E. Coli. PMH significant for hypertension, rheumatoid arthritis, osteoarthritis, CAD, chronic HFpEF, and history of CVA.   Clinical Impression   PTA, pt lived with husband who assisted with LB ADL and tub bench transfers. Upon eval, pt max perseverative on prior medical history. Pt sharing in regard to conditions and injuries she has had and then refusal of mobility reporting "I can't move and that is why I should not participate in therapy". Upon return, max education provided in regard need for acute eval as well as roles of OT outside of simply "more mobility". Max education provided regarding safe fall recover with assist, home set-up, and body mechanics. Pt and husband expressing gratitude. Pt and husband agreeable to initial HHOT home safety eval, but would not like to continue services acutely as pt is medically managed. Acute OT to sign off.      Recommendations for follow up therapy are one component of a multi-disciplinary discharge planning process, led by the attending physician.  Recommendations may be updated based on patient status, additional functional criteria and insurance authorization.   Assistance Recommended at Discharge Frequent or constant Supervision/Assistance  Patient can return home with the following A little help with walking and/or transfers;A little help with bathing/dressing/bathroom;Assistance with cooking/housework;Assist for transportation;Help with stairs or ramp for entrance    Functional Status Assessment  Patient has had a recent decline in their functional status and demonstrates the ability to make significant  improvements in function in a reasonable and predictable amount of time.  Equipment Recommendations  Other (comment) (hoyer)    Recommendations for Other Services       Precautions / Restrictions Precautions Precautions: Fall Restrictions Weight Bearing Restrictions: No      Mobility Bed Mobility Overal bed mobility: Needs Assistance Bed Mobility: Supine to Sit, Sit to Supine     Supine to sit: Supervision Sit to supine: Supervision        Transfers Overall transfer level: Needs assistance Equipment used: None Transfers: Sit to/from Stand, Bed to chair/wheelchair/BSC Sit to Stand: Supervision     Step pivot transfers: Supervision     General transfer comment: pt pivots from bed to [facility], refuses further mobility or assessment from OT as well as strength/ADL assessment      Balance Overall balance assessment: Needs assistance Sitting-balance support: No upper extremity supported, Feet supported Sitting balance-Leahy Scale: Good     Standing balance support: Single extremity supported, Reliant on assistive device for balance Standing balance-Leahy Scale: Poor                             ADL either performed or assessed with clinical judgement   ADL Overall ADL's : Needs assistance/impaired Eating/Feeding: Modified independent;Sitting   Grooming: Set up;Sitting   Upper Body Bathing: Set up;Sitting   Lower Body Bathing: Moderate assistance;Sit to/from stand   Upper Body Dressing : Set up;Sitting   Lower Body Dressing: Moderate assistance;Sitting/lateral leans;Sit to/from stand   Toilet Transfer: Supervision/safety;Stand-pivot           Functional mobility during ADLs: Supervision/safety       Vision Patient Visual Report: No change from baseline  Perception     Praxis      Pertinent Vitals/Pain Pain Assessment Faces Pain Scale: Hurts little more Pain Location: generalized Pain Descriptors / Indicators: Grimacing      Hand Dominance     Extremity/Trunk Assessment Upper Extremity Assessment Upper Extremity Assessment: Generalized weakness (R rotator cuff repair pt and husband reported failed?)   Lower Extremity Assessment Lower Extremity Assessment: Generalized weakness   Cervical / Trunk Assessment Cervical / Trunk Assessment: Kyphotic   Communication Communication Communication: No difficulties   Cognition Arousal/Alertness: Awake/alert Behavior During Therapy: WFL for tasks assessed/performed Overall Cognitive Status: Within Functional Limits for tasks assessed                                 General Comments: Pt able to recall medical history thoroughly. Difficult to be directed to task at hand. Definitely wants to self-direct care. Perhaps decreased health literacy as pt with poor awareness despite max education in OT/PT roles in safety within the home and that therapy does not always involve "walking a long hallway". Also difficulty comprehending that medications in hospital may be different due to acute status of what she has going on. Asked RN to enter room and provide education in regard to why medications are or are not being given     General Comments  VSS on RA. Education provided with spouse on dependent transfers with assistance of multiple individuals and use of bed sheet opposed to lifting pt by limbs due to report of several falls and husband and step son having to get pt off floor. This is only as an alternative if hoyer lift is not able to be purchased. Also max education on bathroom and home safety. pt and husband a little adamant that their way is best, but open to home safety OT eval at time of acute eval after OT acutely providing max education    Exercises     Shoulder Instructions      Home Living Family/patient expects to be discharged to:: Private residence Living Arrangements: Spouse/significant other Available Help at Discharge: Family Type of  Home: House Home Access: Stairs to enter [employer] of Steps: 1 side door step   Home Layout: One level     Bathroom Shower/Tub: Chief Strategy Officer: Handicapped height     Home Equipment: Wheelchair - Forensic psychologist (2 wheels);Cane - single point;Tub bench (electric scooter)          Prior Functioning/Environment Prior Level of Function : Needs assist             Mobility Comments: ambulates for very short distances with RW, often utilizing wheelchair. History of many falls ADLs Comments: assistance with tub transfers and IADLs        OT Problem List: Decreased strength;Decreased activity tolerance;Impaired balance (sitting and/or standing);Decreased cognition;Decreased safety awareness;Decreased knowledge of use of DME or AE      OT Treatment/Interventions:      OT Goals(Current goals can be found in the care plan section) Acute Rehab OT Goals Patient Stated Goal: go home OT Goal Formulation: With patient/family  OT Frequency:      Co-evaluation PT/OT/SLP Co-Evaluation/Treatment: Yes (dovetail) Reason for Co-Treatment: Complexity of the patient's impairments (multi-system involvement) PT goals addressed during session: Mobility/safety with mobility;Balance;Proper use of DME        AM-PAC OT "6 Clicks" Daily Activity     Outcome Measure Help from another person  eating meals?: None Help from another person taking care of personal grooming?: A Little Help from another person toileting, which includes using toliet, bedpan, or urinal?: A Little Help from another person bathing (including washing, rinsing, drying)?: A Little Help from another person to put on and taking off regular upper body clothing?: A Little Help from another person to put on and taking off regular lower body clothing?: A Little 6 Click Score: 19   End of Session Nurse Communication: Mobility status  Activity Tolerance: Patient tolerated treatment  well Patient left: in bed;with call bell/phone within reach;with family/visitor present  OT Visit Diagnosis: Unsteadiness on feet (R26.81);Muscle weakness (generalized) (M62.81);Other symptoms and signs involving cognitive function                Time: 1250-1317 OT Time Calculation (min): 27 min Charges:  OT General Charges $OT Visit: 1 Visit OT Evaluation $OT Eval Moderate Complexity: 1 Mod  Tyler Deis, OTR/L [facility] Acute Rehabilitation Office: [phone]   Myrla Halsted 02/04/2023, 2:10 PM

## 2023-02-04 NOTE — Plan of Care (Signed)
  Problem: Skin Integrity: Goal: Risk for impaired skin integrity will decrease Outcome: Progressing   Problem: Skin Integrity: Goal: Risk for impaired skin integrity will decrease Outcome: Progressing

## 2023-02-04 NOTE — Progress Notes (Signed)
Subjective: No change with her diarrhea or her nausea.  Objective: Vital signs in last 24 hours: Temp:  [97.8 F (36.6 C)-98.7 F (37.1 C)] 98.6 F (37 C) (07/26 0830) Pulse Rate:  [71-84] 81 (07/26 0830) Resp:  [17-19] 17 (07/26 0830) BP: (104-122)/(42-50) 104/42 (07/26 0830) SpO2:  [97 %-99 %] 98 % (07/26 0830) Last BM Date : 02/04/23  Intake/Output from previous day: 07/25 0701 - 07/26 0700 In: 440 [P.O.:440] Out: -  Intake/Output this shift: Total I/O In: 350 [P.O.:350] Out: -   General appearance: alert and no distress GI: soft, non-tender; bowel sounds normal; no masses,  no organomegaly  Lab Results: Recent Labs    02/02/23 1358 02/03/23 0222 02/04/23 0312  WBC 11.7* 10.2 7.4  HGB 11.4* 10.1* 9.3*  HCT 36.2 30.9* 28.8*  PLT 297 248 209   BMET Recent Labs    02/02/23 1358 02/03/23 0222 02/04/23 0312  NA 134* 131* 132*  K 3.4* 3.2* 4.1  CL 99 97* 102  CO2 19* 20* 22  GLUCOSE 119* 120* 95  BUN 26* 26* 19  CREATININE 2.27* 1.99* 1.22*  CALCIUM 8.7* 8.1* 8.2*   LFT Recent Labs    02/02/23 1358  PROT 6.1*  ALBUMIN 3.1*  AST 13*  ALT 12  ALKPHOS 67  BILITOT 0.7   PT/INR No results for input(s): "LABPROT", "INR" in the last 72 hours. Hepatitis Panel No results for input(s): "HEPBSAG", "HCVAB", "HEPAIGM", "HEPBIGM" in the last 72 hours. C-Diff Recent Labs    02/03/23 0100  CDIFFTOX NEGATIVE   Fecal Lactopherrin No results for input(s): "FECLLACTOFRN" in the last 72 hours.  Studies/Results: CT ABDOMEN PELVIS WO CONTRAST  Result Date: 02/02/2023 CLINICAL DATA:  Acute abdominal pain EXAM: CT ABDOMEN AND PELVIS WITHOUT CONTRAST TECHNIQUE: Multidetector CT imaging of the abdomen and pelvis was performed following the standard protocol without IV contrast. RADIATION DOSE REDUCTION: This exam was performed according to the departmental dose-optimization program which includes automated exposure control, adjustment of the mA and/or kV according to  patient size and/or use of iterative reconstruction technique. COMPARISON:  10/26/2020 FINDINGS: Lower chest: Lung bases are free of acute infiltrate or sizable effusion. Hepatobiliary: No focal liver abnormality is seen. Status post cholecystectomy. No biliary dilatation. Pancreas: Unremarkable. No pancreatic ductal dilatation or surrounding inflammatory changes. Spleen: Normal in size without focal abnormality. Adrenals/Urinary Tract: Adrenal glands are within normal limits. Kidneys show no renal calculi or obstructive changes. The bladder is decompressed. Stomach/Bowel: Scattered diverticular change of the colon is noted. No diverticulitis is seen. The appendix is not well visualized and likely surgically removed. No inflammatory changes are noted. Small bowel is within normal limits. Stomach shows a gastric lap band in place. Phi angle measures 58 degrees at the upper limits of normal. Correlation is recommended. Vascular/Lymphatic: Aortic atherosclerosis. No enlarged abdominal or pelvic lymph nodes. Reproductive: Uterus and bilateral adnexa are unremarkable. Other: No abdominal wall hernia or abnormality. No abdominopelvic ascites. Musculoskeletal: No acute or significant osseous findings. Chronic compression deformities involving L2, L3 and L4 are noted. IMPRESSION: Diverticulosis without diverticulitis. No acute abnormality is noted. Gastric lap band is noted with the phi angle at the upper limits of normal. Clinical correlation is recommended Electronically Signed   By: Alcide Clever M.Holmes.   On: 02/02/2023 19:27   DG Chest 2 View  Result Date: 02/02/2023 CLINICAL DATA:  Shortness of breath.  Chest pain. EXAM: CHEST - 2 VIEW COMPARISON:  Two-view chest x-ray 10/14/2022 FINDINGS: Heart is enlarged. Atherosclerotic calcifications  are present at the aortic arch. Mild pulmonary vascular congestion is present. No focal airspace disease present. No effusions are present. IMPRESSION: Cardiomegaly and mild  pulmonary vascular congestion without failure. Electronically Signed   By: Marin Roberts M.Holmes.   On: 02/02/2023 15:29    Medications: Scheduled:  acidophilus  2 capsule Oral Daily   allopurinol  400 mg Oral QHS   aspirin EC  81 mg Oral QHS   atorvastatin  40 mg Oral Daily   azithromycin  500 mg Oral Daily   hydroxychloroquine  200 mg Oral BID   metoprolol tartrate  25 mg Oral BID   potassium chloride  10 mEq Oral Daily   sodium chloride flush  3 mL Intravenous Q12H   Continuous:  Assessment/Plan: 1) Diarrhea. 2) Nausea.   No change with her diarrhea.  She is on day 2 of azithromycin.  She does not feel that she can prep for a colonoscopy.  Plan: 1) Continue with azithromycin. 2) ? Colonoscopy this weekend.  LOS: 2 days   Cindy Holmes 02/04/2023, 1:58 PM

## 2023-02-04 NOTE — Progress Notes (Signed)
PROGRESS NOTE    Cindy Holmes  ZOX:[id number] DOB: August 01, 1950 DOA: 02/02/2023 PCP: [name], MD    Brief Narrative:  72 year old with history of hypertension, rheumatoid arthritis, coronary artery disease, chronic diastolic dysfunction, history of a stroke who presents to the hospital with extreme weakness, ongoing diarrhea and abdominal cramping that is ongoing for more than 6 months but significantly worse for last 2 weeks.  She explains about 10 episodes of diarrhea per day but loose and watery.  Occasionally noticed fatty globules.  Tried multiple medications at home without help.  In the emergency room she was afebrile.  On room air.  CT scan abdomen pelvis with no acute findings.  Creatinine 2.27 with recent normal creatinine.  She was given IV fluids and admitted to the hospital. Her stool exam came back positive for enteropathogenic E. coli.    Assessment and Plan: Acute on chronic diarrhea: Ongoing diarrhea for 6 months.  Worse diarrhea for 2 weeks.  Causing acute kidney injury and hospitalization. Stool C. difficile negative Stool PCR positive for enteropathogenic E. Coli Severe symptomatic disease, will treat with azithromycin for 3 days. GI consulted: She was offered a colonoscopy for tomorrow or to wait and see if her symptoms improve with azithromycin. She does not feel that she can prep for a colonoscopy and she wants to wait.    Acute kidney injury: Recent normal known creatinine.   - No evidence of obstructive pathology on CT scan.     Chronic medical issues including Coronary artery disease, no anginal symptoms.  Continue aspirin, Lipitor and metoprolol. Chronic diastolic heart failure, as above.  Holding ACE inhibitors and diuretics. Paroxysmal A-fib, currently sinus rhythm.  On metoprolol.  Eliquis on hold. Arthritis, managed with Plaquenil and methotrexate. History of stroke, on aspirin, Lipitor and Eliquis.  Holding Eliquis. Hypokalemia and  hypomagnesemia, persistent.  Replace potassium further.  Magnesium is adequate.   DVT prophylaxis:     Code Status: Full Code   Disposition Plan:  Level of care: Telemetry Medical Status is: Inpatient Remains inpatient appropriate     Consultants:  GI   Subjective: Asking about going home  Objective: Vitals:   02/03/23 1628 02/03/23 2059 02/04/23 0456 02/04/23 0830  BP: (!) 116/50 (!) 122/50 (!) 117/47 (!) 104/42  Pulse: 82 84 71 81  Resp: 17 19 19 17   Temp: 98.7 F (37.1 C) 98.6 F (37 C) 97.8 F (36.6 C) 98.6 F (37 C)  TempSrc: Oral  Oral Oral  SpO2: 97% 99% 99% 98%  Weight:      Height:        Intake/Output Summary (Last 24 hours) at 02/04/2023 1220 Last data filed at 02/04/2023 8119 Gross per 24 hour  Intake 550 ml  Output --  Net 550 ml   Filed Weights   02/02/23 1356  Weight: 104.3 kg    Examination:   General: Appearance:    Severely obese female in no acute distress     Lungs:     respirations unlabored  Heart:    Normal heart rate.    MS:   All extremities are intact.    Neurologic:   Awake, alert       Data Reviewed: I have personally reviewed following labs and imaging studies  CBC: Recent Labs  Lab 02/02/23 1358 02/03/23 0222 02/04/23 0312  WBC 11.7* 10.2 7.4  HGB 11.4* 10.1* 9.3*  HCT 36.2 30.9* 28.8*  MCV 94.8 92.2 92.3  PLT 297 248 209   Basic  Metabolic Panel: Recent Labs  Lab 02/02/23 1358 02/02/23 1850 02/03/23 0222 02/04/23 0312  NA 134*  --  131* 132*  K 3.4*  --  3.2* 4.1  CL 99  --  97* 102  CO2 19*  --  20* 22  GLUCOSE 119*  --  120* 95  BUN 26*  --  26* 19  CREATININE 2.27*  --  1.99* 1.22*  CALCIUM 8.7*  --  8.1* 8.2*  MG  --  1.5* 1.9  --    GFR: Estimated Creatinine Clearance: 48.9 mL/min (A) (by C-G formula based on SCr of 1.22 mg/dL (H)). Liver Function Tests: Recent Labs  Lab 02/02/23 1358  AST 13*  ALT 12  ALKPHOS 67  BILITOT 0.7  PROT 6.1*  ALBUMIN 3.1*   Recent Labs  Lab  02/02/23 1358  LIPASE 39   No results for input(s): "AMMONIA" in the last 168 hours. Coagulation Profile: No results for input(s): "INR", "PROTIME" in the last 168 hours. Cardiac Enzymes: No results for input(s): "CKTOTAL", "CKMB", "CKMBINDEX", "TROPONINI" in the last 168 hours. BNP (last 3 results) Recent Labs    05/19/22 1204  PROBNP 380*   HbA1C: No results for input(s): "HGBA1C" in the last 72 hours. CBG: No results for input(s): "GLUCAP" in the last 168 hours. Lipid Profile: No results for input(s): "CHOL", "HDL", "LDLCALC", "TRIG", "CHOLHDL", "LDLDIRECT" in the last 72 hours. Thyroid Function Tests: No results for input(s): "TSH", "T4TOTAL", "FREET4", "T3FREE", "THYROIDAB" in the last 72 hours. Anemia Panel: No results for input(s): "VITAMINB12", "FOLATE", "FERRITIN", "TIBC", "IRON", "RETICCTPCT" in the last 72 hours. Sepsis Labs: No results for input(s): "PROCALCITON", "LATICACIDVEN" in the last 168 hours.  Recent Results (from the past 240 hour(s))  Gastrointestinal Panel by PCR , Stool     Status: Abnormal   Collection Time: 02/02/23  8:10 PM   Specimen: STOOL  Result Value Ref Range Status   Campylobacter species NOT DETECTED NOT DETECTED Final   Plesimonas shigelloides NOT DETECTED NOT DETECTED Final   Salmonella species NOT DETECTED NOT DETECTED Final   Yersinia enterocolitica NOT DETECTED NOT DETECTED Final   Vibrio species NOT DETECTED NOT DETECTED Final   Vibrio cholerae NOT DETECTED NOT DETECTED Final   Enteroaggregative E coli (EAEC) NOT DETECTED NOT DETECTED Final   Enteropathogenic E coli (EPEC) DETECTED (A) NOT DETECTED Final    Comment: RESULT CALLED TO, READ BACK BY AND VERIFIED WITH: ALLEN [facility] 02/03/23 1048 KLW    Enterotoxigenic E coli (ETEC) NOT DETECTED NOT DETECTED Final   Shiga like toxin producing E coli (STEC) NOT DETECTED NOT DETECTED Final   Shigella/Enteroinvasive E coli (EIEC) NOT DETECTED NOT DETECTED Final   Cryptosporidium NOT  DETECTED NOT DETECTED Final   Cyclospora cayetanensis NOT DETECTED NOT DETECTED Final   Entamoeba histolytica NOT DETECTED NOT DETECTED Final   Giardia lamblia NOT DETECTED NOT DETECTED Final   Adenovirus F40/41 NOT DETECTED NOT DETECTED Final   Astrovirus NOT DETECTED NOT DETECTED Final   Norovirus GI/GII NOT DETECTED NOT DETECTED Final   Rotavirus A NOT DETECTED NOT DETECTED Final   Sapovirus (I, II, IV, and V) NOT DETECTED NOT DETECTED Final    Comment: Performed at [facility], [address] [place], [place] [address]  C Difficile Quick Screen w PCR reflex     Status: None   Collection Time: 02/03/23  1:00 AM  Result Value Ref Range Status   C Diff antigen NEGATIVE NEGATIVE Final   C Diff toxin NEGATIVE  NEGATIVE Final   C Diff interpretation No C. difficile detected.  Final    Comment: Performed at [facility], [address], [place], [place] [address]         Radiology Studies: CT ABDOMEN PELVIS WO CONTRAST  Result Date: 02/02/2023 CLINICAL DATA:  Acute abdominal pain EXAM: CT ABDOMEN AND PELVIS WITHOUT CONTRAST TECHNIQUE: Multidetector CT imaging of the abdomen and pelvis was performed following the standard protocol without IV contrast. RADIATION DOSE REDUCTION: This exam was performed according to the departmental dose-optimization program which includes automated exposure control, adjustment of the mA and/or kV according to patient size and/or use of iterative reconstruction technique. COMPARISON:  10/26/2020 FINDINGS: Lower chest: Lung bases are free of acute infiltrate or sizable effusion. Hepatobiliary: No focal liver abnormality is seen. Status post cholecystectomy. No biliary dilatation. Pancreas: Unremarkable. No pancreatic ductal dilatation or surrounding inflammatory changes. Spleen: Normal in size without focal abnormality. Adrenals/Urinary Tract: Adrenal glands are within normal limits. Kidneys show no renal calculi or obstructive changes. The bladder  is decompressed. Stomach/Bowel: Scattered diverticular change of the colon is noted. No diverticulitis is seen. The appendix is not well visualized and likely surgically removed. No inflammatory changes are noted. Small bowel is within normal limits. Stomach shows a gastric lap band in place. Phi angle measures 58 degrees at the upper limits of normal. Correlation is recommended. Vascular/Lymphatic: Aortic atherosclerosis. No enlarged abdominal or pelvic lymph nodes. Reproductive: Uterus and bilateral adnexa are unremarkable. Other: No abdominal wall hernia or abnormality. No abdominopelvic ascites. Musculoskeletal: No acute or significant osseous findings. Chronic compression deformities involving L2, L3 and L4 are noted. IMPRESSION: Diverticulosis without diverticulitis. No acute abnormality is noted. Gastric lap band is noted with the phi angle at the upper limits of normal. Clinical correlation is recommended Electronically Signed   By: Alcide Clever M.D.   On: 02/02/2023 19:27   DG Chest 2 View  Result Date: 02/02/2023 CLINICAL DATA:  Shortness of breath.  Chest pain. EXAM: CHEST - 2 VIEW COMPARISON:  Two-view chest x-ray 10/14/2022 FINDINGS: Heart is enlarged. Atherosclerotic calcifications are present at the aortic arch. Mild pulmonary vascular congestion is present. No focal airspace disease present. No effusions are present. IMPRESSION: Cardiomegaly and mild pulmonary vascular congestion without failure. Electronically Signed   By: Marin Roberts M.D.   On: 02/02/2023 15:29        Scheduled Meds:  acidophilus  2 capsule Oral Daily   allopurinol  400 mg Oral QHS   aspirin EC  81 mg Oral QHS   atorvastatin  40 mg Oral Daily   azithromycin  500 mg Oral Daily   hydroxychloroquine  200 mg Oral BID   metoprolol tartrate  25 mg Oral BID   potassium chloride  10 mEq Oral Daily   sodium chloride flush  3 mL Intravenous Q12H   Continuous Infusions:   LOS: 2 days    Time spent: 45  minutes spent on chart review, discussion with nursing staff, consultants, updating family and interview/physical exam; more than 50% of that time was spent in counseling and/or coordination of care.    Joseph Art, DO [facility] Available via Epic secure chat 7am-7pm After these hours, please refer to coverage provider listed on [email or web address] 02/04/2023, 12:20 PM

## 2023-02-04 NOTE — TOC Progression Note (Signed)
Transition of Care [facility]) - Progression Note    Patient Details  Name: Cindy Holmes MRN: [id number] Date of Birth: 06-13-1951  Transition of Care [facility]) CM/SW Contact  Tom-Johnson, Hershal Coria, RN Phone Number: 02/04/2023, 3:35 PM  Clinical Narrative:     CM spoke with patient at bedside about home health recommendation, patient states she has no preference. CM called in recommendation to [facility] voiced acceptance and info on AVS.   CM will continue to follow as patient progresses with care towards discharge.        Expected Discharge Plan and Services                                               Social Determinants of Health (SDOH) Interventions SDOH Screenings   Tobacco Use: Low Risk  (02/02/2023)    Readmission Risk Interventions    02/03/2023    3:58 PM  Readmission Risk Prevention Plan  Transportation Screening Complete  PCP or Specialist Appt within 3-5 Days Complete  HRI or Home Care Consult Complete  Social Work Consult for Recovery Care Planning/Counseling Complete  Palliative Care Screening Not Applicable  Medication Review Oceanographer) Referral to Pharmacy

## 2023-02-05 DIAGNOSIS — N179 Acute kidney failure, unspecified: Secondary | ICD-10-CM | POA: Diagnosis not present

## 2023-02-05 MED ORDER — FUROSEMIDE 20 MG PO TABS
ORAL_TABLET | ORAL | Status: DC
Start: 2023-02-07 — End: 2023-03-21

## 2023-02-05 MED ORDER — RISAQUAD PO CAPS: 2.0000 | ORAL_CAPSULE | Freq: Every day | ORAL | 0 refills | Status: DC

## 2023-02-05 MED ORDER — LOPERAMIDE HCL 2 MG PO CAPS: 2.0000 mg | ORAL_CAPSULE | ORAL | Status: DC | PRN

## 2023-02-05 MED ORDER — SPIRONOLACTONE 25 MG PO TABS: 25.0000 mg | ORAL_TABLET | Freq: Every day | ORAL | Status: DC

## 2023-02-05 MED ORDER — AZITHROMYCIN 500 MG PO TABS: 500.0000 mg | ORAL_TABLET | Freq: Every day | ORAL | 0 refills | Status: DC

## 2023-02-05 MED ORDER — RAMIPRIL 2.5 MG PO CAPS: 2.5000 mg | ORAL_CAPSULE | Freq: Every day | ORAL | Status: DC

## 2023-02-05 NOTE — TOC Transition Note (Signed)
Transition of Care [facility]) - CM/SW Discharge Note   Patient Details  Name: KIASHA MELANDER MRN: [id number] Date of Birth: 11-19-1950  Transition of Care [facility]) CM/SW Contact:  Ronny Bacon, RN Phone Number: 02/05/2023, 10:25 AM   Clinical Narrative:  Patient is being discharged home today. [facility] confirmed that patient is listed for them to see.      Final next level of care: Home w Home Health Services Barriers to Discharge: No Barriers Identified   Patient Goals and CMS Choice      Discharge Placement                         Discharge Plan and Services Additional resources added to the After Visit Summary for                                       Social Determinants of Health (SDOH) Interventions SDOH Screenings   Tobacco Use: Low Risk  (02/02/2023)     Readmission Risk Interventions    02/03/2023    3:58 PM  Readmission Risk Prevention Plan  Transportation Screening Complete  PCP or Specialist Appt within 3-5 Days Complete  HRI or Home Care Consult Complete  Social Work Consult for Recovery Care Planning/Counseling Complete  Palliative Care Screening Not Applicable  Medication Review Oceanographer) Referral to Pharmacy

## 2023-02-05 NOTE — Plan of Care (Signed)

## 2023-02-05 NOTE — Discharge Summary (Signed)
Physician Discharge Summary  Cindy Holmes HYQ:[id number] DOB: 12-05-1950 DOA: 02/02/2023  PCP: [name], MD  Admit date: 02/02/2023 Discharge date: 02/05/2023  Admitted From: home Discharge disposition: home   Recommendations for Outpatient Follow-Up:   Home health DME recommended by PT ordered Outpatient colonoscopy? Staggered restart of BP meds BMP/CBC 1 week  Discharge Diagnosis:   Principal Problem:   AKI (acute kidney injury) (HCC) Active Problems:   Essential hypertension   Rheumatoid arthritis (HCC)   Coronary artery disease involving native coronary artery of native heart without angina pectoris   Hypokalemia   Chronic diastolic CHF (congestive heart failure) (HCC)   PAF (paroxysmal atrial fibrillation) (HCC)   Diarrhea    Discharge Condition: Improved.  Diet recommendation:   Regular.  Wound care: None.  Code status: Full.   History of Present Illness:    Cindy Holmes is a 72 y.o. female with medical history significant for hypertension, rheumatoid arthritis, CAD, chronic HFpEF, and history of CVA presents to the ED with diarrhea and abdominal pain.   Patient reports roughly 6 months of diarrhea and abdominal discomfort that has worsened significantly in the past couple weeks.  She has generalized abdominal discomfort which is sometimes more severe in the left lower quadrant.  She has more than 10 episodes of diarrhea daily.  She denies any melena or hematochezia.  She has had nausea associated with this but no vomiting.  She denies any travel or sick contacts prior to symptom development.  She has been using Imodium and Pepto-Bismol at home without much relief.   Hospital Course by Problem:   Acute on chronic diarrhea: Ongoing diarrhea for 6 months.  Worse diarrhea for 2 weeks.  Causing acute kidney injury and hospitalization. Stool C. difficile negative Stool PCR positive for enteropathogenic E. Coli Severe symptomatic disease,   treat with azithromycin for 5 days. GI consulted: Clinically she appears better. The topic of the colonoscopy was again discussed with her and she wants to pursue the procedure as an outpatient.    Acute kidney injury: Recent normal known creatinine.   - No evidence of obstructive pathology on CT scan.     Chronic medical issues including Coronary artery disease, no anginal symptoms.  Continue aspirin, Lipitor and metoprolol. Chronic diastolic heart failure, as above.  Holding ACE inhibitors and diuretics. Paroxysmal A-fib, currently sinus rhythm.  On metoprolol.  Eliquis on hold. Arthritis, managed with Plaquenil and methotrexate. History of stroke, on aspirin, Lipitor and Eliquis.  Holding Eliquis. Hypokalemia and hypomagnesemia, persistent.  Replace potassium further.  Magnesium is adequate.    Medical Consultants:   GI   Discharge Exam:   Vitals:   02/04/23 2034 02/05/23 0851  BP: (!) 109/46 (!) 137/49  Pulse: 82 86  Resp: 16 18  Temp: 98.9 F (37.2 C) 98.6 F (37 C)  SpO2: 100% 97%   Vitals:   02/04/23 0830 02/04/23 1522 02/04/23 2034 02/05/23 0851  BP: (!) 104/42 (!) 98/48 (!) 109/46 (!) 137/49  Pulse: 81 77 82 86  Resp: 17 16 16 18   Temp: 98.6 F (37 C) 98.6 F (37 C) 98.9 F (37.2 C) 98.6 F (37 C)  TempSrc: Oral Oral Oral   SpO2: 98% 97% 100% 97%  Weight:      Height:        General exam: Appears calm and comfortable.    The results of significant diagnostics from this hospitalization (including imaging, microbiology, ancillary and laboratory) are listed below  for reference.     Procedures and Diagnostic Studies:   CT ABDOMEN PELVIS WO CONTRAST  Result Date: 02/02/2023 CLINICAL DATA:  Acute abdominal pain EXAM: CT ABDOMEN AND PELVIS WITHOUT CONTRAST TECHNIQUE: Multidetector CT imaging of the abdomen and pelvis was performed following the standard protocol without IV contrast. RADIATION DOSE REDUCTION: This exam was performed according to the  departmental dose-optimization program which includes automated exposure control, adjustment of the mA and/or kV according to patient size and/or use of iterative reconstruction technique. COMPARISON:  10/26/2020 FINDINGS: Lower chest: Lung bases are free of acute infiltrate or sizable effusion. Hepatobiliary: No focal liver abnormality is seen. Status post cholecystectomy. No biliary dilatation. Pancreas: Unremarkable. No pancreatic ductal dilatation or surrounding inflammatory changes. Spleen: Normal in size without focal abnormality. Adrenals/Urinary Tract: Adrenal glands are within normal limits. Kidneys show no renal calculi or obstructive changes. The bladder is decompressed. Stomach/Bowel: Scattered diverticular change of the colon is noted. No diverticulitis is seen. The appendix is not well visualized and likely surgically removed. No inflammatory changes are noted. Small bowel is within normal limits. Stomach shows a gastric lap band in place. Phi angle measures 58 degrees at the upper limits of normal. Correlation is recommended. Vascular/Lymphatic: Aortic atherosclerosis. No enlarged abdominal or pelvic lymph nodes. Reproductive: Uterus and bilateral adnexa are unremarkable. Other: No abdominal wall hernia or abnormality. No abdominopelvic ascites. Musculoskeletal: No acute or significant osseous findings. Chronic compression deformities involving L2, L3 and L4 are noted. IMPRESSION: Diverticulosis without diverticulitis. No acute abnormality is noted. Gastric lap band is noted with the phi angle at the upper limits of normal. Clinical correlation is recommended Electronically Signed   By: Alcide Clever M.D.   On: 02/02/2023 19:27   DG Chest 2 View  Result Date: 02/02/2023 CLINICAL DATA:  Shortness of breath.  Chest pain. EXAM: CHEST - 2 VIEW COMPARISON:  Two-view chest x-ray 10/14/2022 FINDINGS: Heart is enlarged. Atherosclerotic calcifications are present at the aortic arch. Mild pulmonary vascular  congestion is present. No focal airspace disease present. No effusions are present. IMPRESSION: Cardiomegaly and mild pulmonary vascular congestion without failure. Electronically Signed   By: Marin Roberts M.D.   On: 02/02/2023 15:29     Labs:   Basic Metabolic Panel: Recent Labs  Lab 02/02/23 1358 02/02/23 1850 02/03/23 0222 02/04/23 0312 02/05/23 0212  NA 134*  --  131* 132* 132*  K 3.4*  --  3.2* 4.1 4.0  CL 99  --  97* 102 102  CO2 19*  --  20* 22 21*  GLUCOSE 119*  --  120* 95 93  BUN 26*  --  26* 19 14  CREATININE 2.27*  --  1.99* 1.22* 0.96  CALCIUM 8.7*  --  8.1* 8.2* 8.2*  MG  --  1.5* 1.9  --   --    GFR Estimated Creatinine Clearance: 62.1 mL/min (by C-G formula based on SCr of 0.96 mg/dL). Liver Function Tests: Recent Labs  Lab 02/02/23 1358  AST 13*  ALT 12  ALKPHOS 67  BILITOT 0.7  PROT 6.1*  ALBUMIN 3.1*   Recent Labs  Lab 02/02/23 1358  LIPASE 39   No results for input(s): "AMMONIA" in the last 168 hours. Coagulation profile No results for input(s): "INR", "PROTIME" in the last 168 hours.  CBC: Recent Labs  Lab 02/02/23 1358 02/03/23 0222 02/04/23 0312 02/05/23 0212  WBC 11.7* 10.2 7.4 6.7  HGB 11.4* 10.1* 9.3* 9.1*  HCT 36.2 30.9* 28.8* 28.4*  MCV 94.8  92.2 92.3 91.0  PLT 297 248 209 216   Cardiac Enzymes: No results for input(s): "CKTOTAL", "CKMB", "CKMBINDEX", "TROPONINI" in the last 168 hours. BNP: Invalid input(s): "POCBNP" CBG: No results for input(s): "GLUCAP" in the last 168 hours. D-Dimer No results for input(s): "DDIMER" in the last 72 hours. Hgb A1c No results for input(s): "HGBA1C" in the last 72 hours. Lipid Profile No results for input(s): "CHOL", "HDL", "LDLCALC", "TRIG", "CHOLHDL", "LDLDIRECT" in the last 72 hours. Thyroid function studies No results for input(s): "TSH", "T4TOTAL", "T3FREE", "THYROIDAB" in the last 72 hours.  Invalid input(s): "FREET3" Anemia work up No results for input(s):  "VITAMINB12", "FOLATE", "FERRITIN", "TIBC", "IRON", "RETICCTPCT" in the last 72 hours. Microbiology Recent Results (from the past 240 hour(s))  Gastrointestinal Panel by PCR , Stool     Status: Abnormal   Collection Time: 02/02/23  8:10 PM   Specimen: STOOL  Result Value Ref Range Status   Campylobacter species NOT DETECTED NOT DETECTED Final   Plesimonas shigelloides NOT DETECTED NOT DETECTED Final   Salmonella species NOT DETECTED NOT DETECTED Final   Yersinia enterocolitica NOT DETECTED NOT DETECTED Final   Vibrio species NOT DETECTED NOT DETECTED Final   Vibrio cholerae NOT DETECTED NOT DETECTED Final   Enteroaggregative E coli (EAEC) NOT DETECTED NOT DETECTED Final   Enteropathogenic E coli (EPEC) DETECTED (A) NOT DETECTED Final    Comment: RESULT CALLED TO, READ BACK BY AND VERIFIED WITH: [facility] 02/03/23 1048 KLW    Enterotoxigenic E coli (ETEC) NOT DETECTED NOT DETECTED Final   Shiga like toxin producing E coli (STEC) NOT DETECTED NOT DETECTED Final   Shigella/Enteroinvasive E coli (EIEC) NOT DETECTED NOT DETECTED Final   Cryptosporidium NOT DETECTED NOT DETECTED Final   Cyclospora cayetanensis NOT DETECTED NOT DETECTED Final   Entamoeba histolytica NOT DETECTED NOT DETECTED Final   Giardia lamblia NOT DETECTED NOT DETECTED Final   Adenovirus F40/41 NOT DETECTED NOT DETECTED Final   Astrovirus NOT DETECTED NOT DETECTED Final   Norovirus GI/GII NOT DETECTED NOT DETECTED Final   Rotavirus A NOT DETECTED NOT DETECTED Final   Sapovirus (I, II, IV, and V) NOT DETECTED NOT DETECTED Final    Comment: Performed at [facility], [address] [place], [place] [address]  C Difficile Quick Screen w PCR reflex     Status: None   Collection Time: 02/03/23  1:00 AM  Result Value Ref Range Status   C Diff antigen NEGATIVE NEGATIVE Final   C Diff toxin NEGATIVE NEGATIVE Final   C Diff interpretation No C. difficile detected.  Final    Comment: Performed at [facility], [address], [place], [place] [address]     Discharge Instructions:   Discharge Instructions     Diet - low sodium heart healthy   Complete by: As directed    Discharge instructions   Complete by: As directed    I have staggered the restart of your BP meds-- please monitor BP and follow up with PCP for BP check and labs in 1 week   Increase activity slowly   Complete by: As directed       Allergies as of 02/05/2023       Reactions   Codeine Phosphate Shortness Of Breath   Morphine And Codeine Nausea And Vomiting   Amoxicillin Nausea And Vomiting   Penicillins Nausea And Vomiting   Has patient had a PCN reaction causing immediate rash, facial/tongue/throat swelling, SOB or lightheadedness with hypotension:YES  Has patient had a PCN reaction causing severe rash involving mucus membranes or skin necrosis: NO Has patient had a PCN reaction that required hospitalization NO Has patient had a PCN reaction occurring within the last 10 years: NO If all of the above answers are "NO", then may proceed with Cephalosporin use.   Methotrexate Nausea Only   Lidocaine Rash   Reaction to lidocaine patch        Medication List     STOP taking these medications    predniSONE 5 MG tablet Commonly known as: DELTASONE       TAKE these medications    acetaminophen 500 MG tablet Commonly known as: TYLENOL Take 1,000 mg by mouth every 6 (six) hours as needed for headache (pain).   acidophilus Caps capsule Take 2 capsules by mouth daily.   allopurinol 100 MG tablet Commonly known as: ZYLOPRIM Take 400 mg by mouth at bedtime.   apixaban 5 MG Tabs tablet Commonly known as: ELIQUIS Take 1 tablet (5 mg total) by mouth 2 (two) times daily.   aspirin EC 81 MG tablet Take 81 mg by mouth at bedtime. Swallow whole.   atorvastatin 40 MG tablet Commonly known as: LIPITOR Take 2 tablets (80 mg total) by mouth daily   azithromycin 500 MG tablet Commonly known as:  ZITHROMAX Take 1 tablet (500 mg total) by mouth daily.   ezetimibe 10 MG tablet Commonly known as: ZETIA Take 1 tablet (10 mg total) by mouth daily.   Fish Oil 1000 MG Caps Take 1,000 mg by mouth at bedtime.   folic acid 1 MG tablet Commonly known as: FOLVITE Take 1 mg by mouth at bedtime.   furosemide 20 MG tablet Commonly known as: LASIX Take 3 tablets (60 mg total) by mouth in the morning, then take 2 tablets (40 mg total) by mouth in the afternoon, everyday. Start taking on: February 07, 2023 What changed: These instructions start on February 07, 2023. If you are unsure what to do until then, ask your doctor or other care provider.   hydroxychloroquine 200 MG tablet Commonly known as: PLAQUENIL Take 200 mg by mouth at bedtime.   loperamide 2 MG capsule Commonly known as: IMODIUM Take 1 capsule (2 mg total) by mouth as needed for diarrhea or loose stools.   methotrexate 2.5 MG tablet Commonly known as: RHEUMATREX Take 10 mg by mouth every Sunday.   metoprolol tartrate 25 MG tablet Commonly known as: LOPRESSOR TAKE 1 TABLET BY MOUTH TWICE  DAILY ALONG WITH 50MG  TABLET FOR A TOTAL OF 75MG  TWICE DAILY What changed: See the new instructions.   metoprolol tartrate 50 MG tablet Commonly known as: LOPRESSOR TAKE 1 TABLET BY MOUTH TWICE  DAILY WITH 25MG  TABLET FOR A  TOTAL DOSE OF 75MG  TWICE DAILY What changed: See the new instructions.   Potassium Chloride ER 20 MEQ Tbcr Take 10 mEq by mouth daily.   ramipril 2.5 MG capsule Commonly known as: ALTACE Take 1 capsule (2.5 mg total) by mouth daily. Start taking on: February 09, 2023 What changed: These instructions start on February 09, 2023. If you are unsure what to do until then, ask your doctor or other care provider.   spironolactone 25 MG tablet Commonly known as: ALDACTONE Take 1 tablet (25 mg total) by mouth daily. Start taking on: February 11, 2023 What changed: These instructions start on February 11, 2023. If you are unsure what  to do until then, ask your doctor or other care provider.  zolpidem 5 MG tablet Commonly known as: AMBIEN Take 5 mg by mouth at bedtime.        Follow-up Information     [facility] Follow up.   Why: Someone will call you to schedule first home visit. Contact information: [address] [place] [address]  [phone]        [name], MD Follow up in 1 week(s).   Specialty: Internal Medicine Why: BP check Contact information: [address] [place] [address] [phone]                  Time coordinating discharge: 45 min  Signed:  Joseph Art DO  [facility] 02/05/2023, 9:55 AM

## 2023-02-05 NOTE — Progress Notes (Signed)
DISCHARGE NOTE HOME Cindy Holmes to be discharged Home per MD order. Discussed prescriptions and follow up appointments with the patient. Prescriptions given to patient; medication list explained in detail. Patient verbalized understanding.  Skin clean, dry and intact without evidence of skin break down, no evidence of skin tears noted. IV catheter discontinued intact. Site without signs and symptoms of complications. Dressing and pressure applied. Pt denies pain at the site currently. No complaints noted.  Patient free of lines, drains, and wounds.   An After Visit Summary (AVS) was printed and given to the patient. Patient escorted via wheelchair, and discharged home via private auto.  Oda Cogan, LPN

## 2023-02-05 NOTE — Progress Notes (Signed)
Subjective: She still has diarrhea, but she feels that she is getting better.  Less nauseous.  Objective: Vital signs in last 24 hours: Temp:  [98.6 F (37 C)-98.9 F (37.2 C)] 98.9 F (37.2 C) (07/26 2034) Pulse Rate:  [77-82] 82 (07/26 2034) Resp:  [16-17] 16 (07/26 2034) BP: (98-109)/(42-48) 109/46 (07/26 2034) SpO2:  [97 %-100 %] 100 % (07/26 2034) Last BM Date : 02/04/23  Intake/Output from previous day: 07/26 0701 - 07/27 0700 In: 350 [P.O.:350] Out: 0  Intake/Output this shift: No intake/output data recorded.  General appearance: alert and no distress GI: soft, non-tender; bowel sounds normal; no masses,  no organomegaly  Lab Results: Recent Labs    02/03/23 0222 02/04/23 0312 02/05/23 0212  WBC 10.2 7.4 6.7  HGB 10.1* 9.3* 9.1*  HCT 30.9* 28.8* 28.4*  PLT 248 209 216   BMET Recent Labs    02/03/23 0222 02/04/23 0312 02/05/23 0212  NA 131* 132* 132*  K 3.2* 4.1 4.0  CL 97* 102 102  CO2 20* 22 21*  GLUCOSE 120* 95 93  BUN 26* 19 14  CREATININE 1.99* 1.22* 0.96  CALCIUM 8.1* 8.2* 8.2*   LFT Recent Labs    02/02/23 1358  PROT 6.1*  ALBUMIN 3.1*  AST 13*  ALT 12  ALKPHOS 67  BILITOT 0.7   PT/INR No results for input(s): "LABPROT", "INR" in the last 72 hours. Hepatitis Panel No results for input(s): "HEPBSAG", "HCVAB", "HEPAIGM", "HEPBIGM" in the last 72 hours. C-Diff Recent Labs    02/03/23 0100  CDIFFTOX NEGATIVE   Fecal Lactopherrin No results for input(s): "FECLLACTOFRN" in the last 72 hours.  Studies/Results: No results found.  Medications: Scheduled:  acidophilus  2 capsule Oral Daily   allopurinol  400 mg Oral QHS   aspirin EC  81 mg Oral QHS   atorvastatin  40 mg Oral Daily   azithromycin  500 mg Oral Daily   hydroxychloroquine  200 mg Oral BID   metoprolol tartrate  25 mg Oral BID   potassium chloride  10 mEq Oral Daily   sodium chloride flush  3 mL Intravenous Q12H   Continuous:  Assessment/Plan: 1) Diarrhea -  EPEC. 2) Nausea.   Clinically she appears better.  The topic of the colonoscopy was again discussed with her and she wants to pursue the procedure as an outpatient.  She feels that she is making progress with the diarrhea.  Plan: 1) Treat with azithromycin for a total of 5 days. 2) ? D/C home today per Hospitalist. 3) Follow up in the office in 1-2 weeks upon discharge.  LOS: 3 days   Bo Rogue D 02/05/2023, 7:54 AM

## 2023-02-11 DIAGNOSIS — R197 Diarrhea, unspecified: Secondary | ICD-10-CM | POA: Diagnosis not present

## 2023-02-11 DIAGNOSIS — M069 Rheumatoid arthritis, unspecified: Secondary | ICD-10-CM | POA: Diagnosis not present

## 2023-02-11 DIAGNOSIS — I4891 Unspecified atrial fibrillation: Secondary | ICD-10-CM | POA: Diagnosis not present

## 2023-02-11 DIAGNOSIS — G894 Chronic pain syndrome: Secondary | ICD-10-CM | POA: Diagnosis not present

## 2023-03-02 ENCOUNTER — Other Ambulatory Visit: Payer: Self-pay

## 2023-03-02 ENCOUNTER — Inpatient Hospital Stay (HOSPITAL_COMMUNITY)
Admission: EM | Admit: 2023-03-02 | Discharge: 2023-03-21 | DRG: 329 | Disposition: A | Discharge status: Home Health | Payer: Medicare Other | Attending: Family Medicine | Admitting: Family Medicine

## 2023-03-02 ENCOUNTER — Encounter (HOSPITAL_COMMUNITY): Payer: Self-pay

## 2023-03-02 DIAGNOSIS — N179 Acute kidney failure, unspecified: Secondary | ICD-10-CM | POA: Diagnosis present

## 2023-03-02 DIAGNOSIS — I480 Paroxysmal atrial fibrillation: Secondary | ICD-10-CM | POA: Diagnosis not present

## 2023-03-02 DIAGNOSIS — K5732 Diverticulitis of large intestine without perforation or abscess without bleeding: Secondary | ICD-10-CM | POA: Diagnosis not present

## 2023-03-02 DIAGNOSIS — I959 Hypotension, unspecified: Secondary | ICD-10-CM | POA: Diagnosis not present

## 2023-03-02 DIAGNOSIS — I10 Essential (primary) hypertension: Secondary | ICD-10-CM | POA: Diagnosis present

## 2023-03-02 DIAGNOSIS — E8809 Other disorders of plasma-protein metabolism, not elsewhere classified: Secondary | ICD-10-CM | POA: Diagnosis not present

## 2023-03-02 DIAGNOSIS — Z885 Allergy status to narcotic agent status: Secondary | ICD-10-CM

## 2023-03-02 DIAGNOSIS — M059 Rheumatoid arthritis with rheumatoid factor, unspecified: Secondary | ICD-10-CM | POA: Diagnosis not present

## 2023-03-02 DIAGNOSIS — E871 Hypo-osmolality and hyponatremia: Secondary | ICD-10-CM | POA: Diagnosis not present

## 2023-03-02 DIAGNOSIS — R109 Unspecified abdominal pain: Secondary | ICD-10-CM | POA: Diagnosis not present

## 2023-03-02 DIAGNOSIS — E785 Hyperlipidemia, unspecified: Secondary | ICD-10-CM | POA: Diagnosis present

## 2023-03-02 DIAGNOSIS — R0989 Other specified symptoms and signs involving the circulatory and respiratory systems: Secondary | ICD-10-CM | POA: Diagnosis not present

## 2023-03-02 DIAGNOSIS — Z8261 Family history of arthritis: Secondary | ICD-10-CM

## 2023-03-02 DIAGNOSIS — I110 Hypertensive heart disease with heart failure: Secondary | ICD-10-CM | POA: Diagnosis present

## 2023-03-02 DIAGNOSIS — N289 Disorder of kidney and ureter, unspecified: Secondary | ICD-10-CM | POA: Diagnosis not present

## 2023-03-02 DIAGNOSIS — K56699 Other intestinal obstruction unspecified as to partial versus complete obstruction: Secondary | ICD-10-CM | POA: Diagnosis present

## 2023-03-02 DIAGNOSIS — I130 Hypertensive heart and chronic kidney disease with heart failure and stage 1 through stage 4 chronic kidney disease, or unspecified chronic kidney disease: Secondary | ICD-10-CM | POA: Diagnosis not present

## 2023-03-02 DIAGNOSIS — D638 Anemia in other chronic diseases classified elsewhere: Secondary | ICD-10-CM | POA: Diagnosis present

## 2023-03-02 DIAGNOSIS — K5730 Diverticulosis of large intestine without perforation or abscess without bleeding: Secondary | ICD-10-CM | POA: Diagnosis not present

## 2023-03-02 DIAGNOSIS — R062 Wheezing: Secondary | ICD-10-CM | POA: Diagnosis not present

## 2023-03-02 DIAGNOSIS — I5023 Acute on chronic systolic (congestive) heart failure: Secondary | ICD-10-CM | POA: Diagnosis not present

## 2023-03-02 DIAGNOSIS — E872 Acidosis, unspecified: Secondary | ICD-10-CM | POA: Diagnosis present

## 2023-03-02 DIAGNOSIS — E876 Hypokalemia: Secondary | ICD-10-CM | POA: Diagnosis not present

## 2023-03-02 DIAGNOSIS — D84821 Immunodeficiency due to drugs: Secondary | ICD-10-CM | POA: Diagnosis present

## 2023-03-02 DIAGNOSIS — Z888 Allergy status to other drugs, medicaments and biological substances status: Secondary | ICD-10-CM

## 2023-03-02 DIAGNOSIS — R1084 Generalized abdominal pain: Secondary | ICD-10-CM | POA: Diagnosis not present

## 2023-03-02 DIAGNOSIS — Z79899 Other long term (current) drug therapy: Secondary | ICD-10-CM

## 2023-03-02 DIAGNOSIS — I70212 Atherosclerosis of native arteries of extremities with intermittent claudication, left leg: Secondary | ICD-10-CM | POA: Diagnosis not present

## 2023-03-02 DIAGNOSIS — I5032 Chronic diastolic (congestive) heart failure: Secondary | ICD-10-CM | POA: Diagnosis not present

## 2023-03-02 DIAGNOSIS — A09 Infectious gastroenteritis and colitis, unspecified: Secondary | ICD-10-CM

## 2023-03-02 DIAGNOSIS — M069 Rheumatoid arthritis, unspecified: Secondary | ICD-10-CM | POA: Diagnosis not present

## 2023-03-02 DIAGNOSIS — K5651 Intestinal adhesions [bands], with partial obstruction: Secondary | ICD-10-CM | POA: Diagnosis not present

## 2023-03-02 DIAGNOSIS — N261 Atrophy of kidney (terminal): Secondary | ICD-10-CM | POA: Diagnosis not present

## 2023-03-02 DIAGNOSIS — N189 Chronic kidney disease, unspecified: Secondary | ICD-10-CM | POA: Diagnosis not present

## 2023-03-02 DIAGNOSIS — Z8249 Family history of ischemic heart disease and other diseases of the circulatory system: Secondary | ICD-10-CM

## 2023-03-02 DIAGNOSIS — Z955 Presence of coronary angioplasty implant and graft: Secondary | ICD-10-CM

## 2023-03-02 DIAGNOSIS — Z7901 Long term (current) use of anticoagulants: Secondary | ICD-10-CM

## 2023-03-02 DIAGNOSIS — K5669 Other partial intestinal obstruction: Secondary | ICD-10-CM | POA: Diagnosis not present

## 2023-03-02 DIAGNOSIS — I272 Pulmonary hypertension, unspecified: Secondary | ICD-10-CM | POA: Diagnosis present

## 2023-03-02 DIAGNOSIS — Z800 Family history of malignant neoplasm of digestive organs: Secondary | ICD-10-CM

## 2023-03-02 DIAGNOSIS — E782 Mixed hyperlipidemia: Secondary | ICD-10-CM

## 2023-03-02 DIAGNOSIS — I2510 Atherosclerotic heart disease of native coronary artery without angina pectoris: Secondary | ICD-10-CM | POA: Diagnosis present

## 2023-03-02 DIAGNOSIS — K56609 Unspecified intestinal obstruction, unspecified as to partial versus complete obstruction: Secondary | ICD-10-CM | POA: Diagnosis not present

## 2023-03-02 DIAGNOSIS — E860 Dehydration: Secondary | ICD-10-CM | POA: Diagnosis present

## 2023-03-02 DIAGNOSIS — Z79631 Long term (current) use of antimetabolite agent: Secondary | ICD-10-CM | POA: Diagnosis not present

## 2023-03-02 DIAGNOSIS — Z933 Colostomy status: Secondary | ICD-10-CM

## 2023-03-02 DIAGNOSIS — Z01810 Encounter for preprocedural cardiovascular examination: Secondary | ICD-10-CM | POA: Diagnosis not present

## 2023-03-02 DIAGNOSIS — Z8673 Personal history of transient ischemic attack (TIA), and cerebral infarction without residual deficits: Secondary | ICD-10-CM

## 2023-03-02 DIAGNOSIS — Z801 Family history of malignant neoplasm of trachea, bronchus and lung: Secondary | ICD-10-CM

## 2023-03-02 DIAGNOSIS — Z803 Family history of malignant neoplasm of breast: Secondary | ICD-10-CM

## 2023-03-02 DIAGNOSIS — I4819 Other persistent atrial fibrillation: Secondary | ICD-10-CM | POA: Diagnosis not present

## 2023-03-02 DIAGNOSIS — Z7982 Long term (current) use of aspirin: Secondary | ICD-10-CM

## 2023-03-02 DIAGNOSIS — I5033 Acute on chronic diastolic (congestive) heart failure: Secondary | ICD-10-CM | POA: Diagnosis not present

## 2023-03-02 DIAGNOSIS — Z880 Allergy status to penicillin: Secondary | ICD-10-CM

## 2023-03-02 DIAGNOSIS — I700 Atherosclerosis of aorta: Secondary | ICD-10-CM | POA: Diagnosis not present

## 2023-03-02 DIAGNOSIS — Z6841 Body Mass Index (BMI) 40.0 and over, adult: Secondary | ICD-10-CM

## 2023-03-02 DIAGNOSIS — Z833 Family history of diabetes mellitus: Secondary | ICD-10-CM

## 2023-03-02 DIAGNOSIS — R197 Diarrhea, unspecified: Secondary | ICD-10-CM | POA: Diagnosis not present

## 2023-03-02 DIAGNOSIS — Z993 Dependence on wheelchair: Secondary | ICD-10-CM | POA: Diagnosis not present

## 2023-03-02 DIAGNOSIS — R262 Difficulty in walking, not elsewhere classified: Secondary | ICD-10-CM | POA: Diagnosis not present

## 2023-03-02 DIAGNOSIS — D509 Iron deficiency anemia, unspecified: Secondary | ICD-10-CM | POA: Diagnosis not present

## 2023-03-02 DIAGNOSIS — Z7401 Bed confinement status: Secondary | ICD-10-CM | POA: Diagnosis not present

## 2023-03-02 DIAGNOSIS — M7989 Other specified soft tissue disorders: Secondary | ICD-10-CM | POA: Diagnosis not present

## 2023-03-02 DIAGNOSIS — R6889 Other general symptoms and signs: Secondary | ICD-10-CM

## 2023-03-02 DIAGNOSIS — E8770 Fluid overload, unspecified: Secondary | ICD-10-CM | POA: Diagnosis not present

## 2023-03-02 DIAGNOSIS — R152 Fecal urgency: Secondary | ICD-10-CM | POA: Diagnosis not present

## 2023-03-02 LAB — URINALYSIS, ROUTINE W REFLEX MICROSCOPIC
Bilirubin Urine: NEGATIVE
Glucose, UA: NEGATIVE mg/dL
Hgb urine dipstick: NEGATIVE
Ketones, ur: NEGATIVE mg/dL
Nitrite: NEGATIVE
Protein, ur: NEGATIVE mg/dL
Specific Gravity, Urine: 1.02 (ref 1.005–1.030)
pH: 5 (ref 5.0–8.0)

## 2023-03-02 LAB — COMPREHENSIVE METABOLIC PANEL
ALT: 9 U/L (ref 0–44)
AST: 10 U/L — ABNORMAL LOW (ref 15–41)
Albumin: 2.6 g/dL — ABNORMAL LOW (ref 3.5–5.0)
Alkaline Phosphatase: 65 U/L (ref 38–126)
Anion gap: 12 (ref 5–15)
BUN: 36 mg/dL — ABNORMAL HIGH (ref 8–23)
CO2: 18 mmol/L — ABNORMAL LOW (ref 22–32)
Calcium: 8.4 mg/dL — ABNORMAL LOW (ref 8.9–10.3)
Chloride: 102 mmol/L (ref 98–111)
Creatinine, Ser: 1.83 mg/dL — ABNORMAL HIGH (ref 0.44–1.00)
GFR, Estimated: 29 mL/min — ABNORMAL LOW (ref >60)
Glucose, Bld: 113 mg/dL — ABNORMAL HIGH (ref 70–99)
Potassium: 3.9 mmol/L (ref 3.5–5.1)
Sodium: 132 mmol/L — ABNORMAL LOW (ref 135–145)
Total Bilirubin: 0.8 mg/dL (ref 0.3–1.2)
Total Protein: 5.6 g/dL — ABNORMAL LOW (ref 6.5–8.1)

## 2023-03-02 LAB — CBC WITH DIFFERENTIAL/PLATELET
Abs Immature Granulocytes: 0.05 10*3/uL (ref 0.00–0.07)
Basophils Absolute: 0.1 10*3/uL (ref 0.0–0.1)
Basophils Relative: 1 %
Eosinophils Absolute: 0.3 10*3/uL (ref 0.0–0.5)
Eosinophils Relative: 3 %
HCT: 33.8 % — ABNORMAL LOW (ref 36.0–46.0)
Hemoglobin: 10.9 g/dL — ABNORMAL LOW (ref 12.0–15.0)
Immature Granulocytes: 1 %
Lymphocytes Relative: 18 %
Lymphs Abs: 1.9 10*3/uL (ref 0.7–4.0)
MCH: 30.6 pg (ref 26.0–34.0)
MCHC: 32.2 g/dL (ref 30.0–36.0)
MCV: 94.9 fL (ref 80.0–100.0)
Monocytes Absolute: 1 10*3/uL (ref 0.1–1.0)
Monocytes Relative: 9 %
Neutro Abs: 7.4 10*3/uL (ref 1.7–7.7)
Neutrophils Relative %: 68 %
Platelets: 280 10*3/uL (ref 150–400)
RBC: 3.56 MIL/uL — ABNORMAL LOW (ref 3.87–5.11)
RDW: 17.9 % — ABNORMAL HIGH (ref 11.5–15.5)
WBC: 10.7 10*3/uL — ABNORMAL HIGH (ref 4.0–10.5)
nRBC: 0 % (ref 0.0–0.2)

## 2023-03-02 LAB — LIPASE, BLOOD: Lipase: 50 U/L (ref 11–51)

## 2023-03-02 MED ORDER — APIXABAN 5 MG PO TABS
5.0000 mg | ORAL_TABLET | Freq: Two times a day (BID) | ORAL | Status: DC
  Administered 2023-03-03 (×2): 5 mg via ORAL
  Filled 2023-03-02 (×2): qty 1

## 2023-03-02 MED ORDER — LACTATED RINGERS IV BOLUS
1000.0000 mL | Freq: Once | INTRAVENOUS | Status: AC
  Administered 2023-03-02: 1000 mL via INTRAVENOUS

## 2023-03-02 MED ORDER — LACTATED RINGERS IV SOLN: INTRAVENOUS | Status: DC

## 2023-03-02 MED ORDER — ASPIRIN 81 MG PO TBEC
81.0000 mg | DELAYED_RELEASE_TABLET | Freq: Every day | ORAL | Status: DC
  Administered 2023-03-04 – 2023-03-07 (×4): 81 mg via ORAL
  Filled 2023-03-02 (×5): qty 1

## 2023-03-02 MED ORDER — ATORVASTATIN CALCIUM 80 MG PO TABS
80.0000 mg | ORAL_TABLET | Freq: Every day | ORAL | Status: DC
  Administered 2023-03-04 – 2023-03-08 (×5): 80 mg via ORAL
  Filled 2023-03-02 (×6): qty 1

## 2023-03-02 MED ORDER — ZOLPIDEM TARTRATE 5 MG PO TABS
5.0000 mg | ORAL_TABLET | Freq: Every day | ORAL | Status: DC
  Administered 2023-03-03 – 2023-03-07 (×6): 5 mg via ORAL
  Filled 2023-03-02 (×6): qty 1

## 2023-03-02 MED ORDER — METOPROLOL TARTRATE 50 MG PO TABS
75.0000 mg | ORAL_TABLET | Freq: Two times a day (BID) | ORAL | Status: DC
  Administered 2023-03-03 – 2023-03-04 (×2): 75 mg via ORAL
  Filled 2023-03-02 (×3): qty 1

## 2023-03-02 MED ORDER — SODIUM CHLORIDE 0.9 % IV SOLN
500.0000 mg | INTRAVENOUS | Status: DC
  Administered 2023-03-02 – 2023-03-03 (×2): 500 mg via INTRAVENOUS
  Filled 2023-03-02 (×3): qty 5

## 2023-03-02 MED ORDER — ALLOPURINOL 300 MG PO TABS
400.0000 mg | ORAL_TABLET | Freq: Every day | ORAL | Status: DC
  Administered 2023-03-03 – 2023-03-05 (×4): 400 mg via ORAL
  Administered 2023-03-06: 100 mg via ORAL
  Administered 2023-03-07: 400 mg via ORAL
  Filled 2023-03-02 (×6): qty 1

## 2023-03-02 MED ORDER — EZETIMIBE 10 MG PO TABS
10.0000 mg | ORAL_TABLET | Freq: Every day | ORAL | Status: DC
  Administered 2023-03-03 – 2023-03-08 (×5): 10 mg via ORAL
  Filled 2023-03-02 (×6): qty 1

## 2023-03-02 NOTE — ED Notes (Signed)
Pt refuse to change into hospital gown

## 2023-03-02 NOTE — Assessment & Plan Note (Signed)
BMI of 40 

## 2023-03-02 NOTE — ED Notes (Signed)
Assisted pt to the bathroom attempted to get stool pt voided in the front and back hat urine sent

## 2023-03-02 NOTE — ED Notes (Signed)
ED TO INPATIENT HANDOFF REPORT  ED Nurse Name and Phone #: Gillis Ends [phone]  S Name/Age/Gender Cindy Holmes 72 y.o. female Room/Bed: H009C/H009C  Code Status   Code Status: Full Code  Home/SNF/Other Home Patient oriented to: self, place, time, and situation Is this baseline? Yes   Triage Complete: Triage complete  Chief Complaint Diarrhea [R19.7]  Triage Note Pt c/o N/V/Dx3wks.    Allergies Allergies  Allergen Reactions   Codeine Phosphate Shortness Of Breath   Morphine And Codeine Nausea And Vomiting   Amoxicillin Nausea And Vomiting   Penicillins Nausea And Vomiting    Has patient had a PCN reaction causing immediate rash, facial/tongue/throat swelling, SOB or lightheadedness with hypotension:YES Has patient had a PCN reaction causing severe rash involving mucus membranes or skin necrosis: NO Has patient had a PCN reaction that required hospitalization NO Has patient had a PCN reaction occurring within the last 10 years: NO If all of the above answers are "NO", then may proceed with Cephalosporin use.   Methotrexate Nausea Only   Lidocaine Rash    Reaction to lidocaine patch    Level of Care/Admitting Diagnosis ED Disposition     ED Disposition  Admit   Condition  --   Comment  Hospital Area: [facility] [[id number]]  Level of Care: Telemetry Medical [104]  May admit patient to [facility] or [facility] if equivalent level of care is available:: No  Covid Evaluation: Asymptomatic - no recent exposure (last 10 days) testing not required  Diagnosis: Diarrhea [787.91.ICD-9-CM]  Admitting Physician: Anselm Jungling [[id number]]  Attending Physician: Anselm Jungling [[id number]]  Certification:: I certify this patient will need inpatient services for at least 2 midnights  Expected Medical Readiness: 03/04/2023          B Medical/Surgery History Past Medical History:  Diagnosis Date   CAD (coronary artery disease)    a. PTCA LAD 2001/cath 2002-prox and  mid LAD stents patent, PTCA ostium Dx 1 b. cath 03/2016: patent LAD stent with less than 40% in-stent restenosis, 10-30% stenosis of D1 and distal LAD with continued medical management recommended. 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   Past Surgical History:  Procedure Laterality Date   CARDIAC CATHETERIZATION     with stent placement in 2001   CARDIAC CATHETERIZATION N/A 03/12/2016   Procedure: Left Heart Cath and Coronary Angiography;  Surgeon: Yvonne Kendall, MD;  Location: [facility] INVASIVE CV LAB;  Service: Cardiovascular;  Laterality: N/A;   CARDIAC CATHETERIZATION N/A 03/12/2016   Procedure: Intravascular Pressure Wire/FFR Study;  Surgeon: Yvonne Kendall, MD;  Location: [facility] INVASIVE CV LAB;  Service: Cardiovascular;  Laterality: N/A;   CHOLECYSTECTOMY     CORONARY ANGIOPLASTY  2002   ESOPHAGOGASTRODUODENOSCOPY N/A 12/03/2016   Procedure: ESOPHAGOGASTRODUODENOSCOPY (EGD);  Surgeon: Jeani Hawking, MD;  Location: [facility] ENDOSCOPY;  Service: Endoscopy;  Laterality: N/A;   laproscopic adjustable gastric  banding     with APS standard system.    OPEN REDUCTION INTERNAL FIXATION (ORIF) DISTAL RADIAL FRACTURE Right 05/15/2018   Procedure: OPEN REDUCTION INTERNAL FIXATION (ORIF) DISTAL RADIAL FRACTURE;  Surgeon: Roby Lofts, MD;  Location: [facility]  OR;  Service: Orthopedics;  Laterality: Right;   ORIF ANKLE FRACTURE Right 05/15/2018   Procedure: OPEN REDUCTION INTERNAL FIXATION (ORIF) ANKLE FRACTURE;  Surgeon: Roby Lofts, MD;  Location: [facility] OR;  Service: Orthopedics;  Laterality: Right;   ORIF TIBIA PLATEAU Right 05/15/2018   Procedure: OPEN REDUCTION INTERNAL FIXATION (ORIF) TIBIAL PLATEAU;  Surgeon: Roby Lofts, MD;  Location: [facility] OR;  Service: Orthopedics;  Laterality: Right;   Pt has 3 stents     sept 2001   SHOULDER ARTHROSCOPY WITH SUBACROMIAL DECOMPRESSION, ROTATOR CUFF REPAIR AND BICEP TENDON REPAIR Right 11/16/2013   Procedure: RIGHT SHOULDER ARTHROSCOPY, EXTENSIVE DEBRIDEMENT, ROTATOR CUFF REPAIR ;  Surgeon: Eulas Post, MD;  Location:  SURGERY CENTER;  Service: Orthopedics;  Laterality: Right;   TUBAL LIGATION       A IV Location/Drains/Wounds Patient Lines/Drains/Airways Status     Active Line/Drains/Airways     Name Placement date Placement time Site Days   Peripheral IV 03/02/23 20 G Right Antecubital 03/02/23  1539  Antecubital  less than 1            Intake/Output Last 24 hours No intake or output data in the 24 hours ending 03/02/23 2101  Labs/Imaging Results for orders placed or performed during the hospital encounter of 03/02/23 (from the past 48 hour(s))  Comprehensive metabolic panel     Status: Abnormal   Collection Time: 03/02/23 11:19 AM  Result Value Ref Range   Sodium 132 (L) 135 - 145 mmol/L   Potassium 3.9 3.5 - 5.1 mmol/L   Chloride 102 98 - 111 mmol/L   CO2 18 (L) 22 - 32 mmol/L   Glucose, Bld 113 (H) 70 - 99 mg/dL    Comment: Glucose reference range applies only to samples taken after fasting for at least 8 hours.   BUN 36 (H) 8 - 23 mg/dL   Creatinine, Ser 4.13 (H) 0.44 - 1.00 mg/dL   Calcium 8.4 (L) 8.9 - 10.3 mg/dL   Total Protein 5.6 (L) 6.5 - 8.1 g/dL   Albumin 2.6 (L) 3.5 - 5.0 g/dL   AST 10 (L) 15 - 41 U/L   ALT 9 0 - 44 U/L   Alkaline Phosphatase 65 38 - 126  U/L   Total Bilirubin 0.8 0.3 - 1.2 mg/dL   GFR, Estimated 29 (L) >60 mL/min    Comment: (NOTE) Calculated using the CKD-EPI Creatinine Equation (2021)    Anion gap 12 5 - 15    Comment: Performed at [facility], [address] [place], [place] [address]  Lipase, blood     Status: None   Collection Time: 03/02/23 11:19 AM  Result Value Ref Range   Lipase 50 11 - 51 U/L    Comment: Performed at [facility], [address] [place], [place] [address]  CBC with Diff     Status: Abnormal   Collection Time: 03/02/23 11:19 AM  Result Value Ref Range   WBC 10.7 (H) 4.0 - 10.5 K/uL   RBC 3.56 (L) 3.87 -  5.11 MIL/uL   Hemoglobin 10.9 (L) 12.0 - 15.0 g/dL   HCT 44.0 (L) 34.7 - 42.5 %   MCV 94.9 80.0 - 100.0 fL   MCH 30.6 26.0 - 34.0 pg   MCHC 32.2 30.0 - 36.0 g/dL   RDW 95.6 (H) 38.7 - 56.4 %   Platelets 280 150 - 400 K/uL   nRBC 0.0 0.0 - 0.2 %   Neutrophils Relative % 68 %   Neutro Abs 7.4 1.7 - 7.7 K/uL   Lymphocytes Relative 18 %   Lymphs Abs 1.9 0.7 - 4.0 K/uL   Monocytes Relative 9 %   Monocytes Absolute 1.0 0.1 - 1.0 K/uL   Eosinophils Relative 3 %   Eosinophils Absolute 0.3 0.0 - 0.5 K/uL   Basophils Relative 1 %   Basophils Absolute 0.1 0.0 - 0.1 K/uL   Immature Granulocytes 1 %   Abs Immature Granulocytes 0.05 0.00 - 0.07 K/uL    Comment: Performed at [facility], [address] [place], [place] [address]  Urinalysis, Routine w reflex microscopic -Urine, Clean Catch     Status: Abnormal   Collection Time: 03/02/23  6:00 PM  Result Value Ref Range   Color, Urine YELLOW YELLOW   APPearance HAZY (A) CLEAR   Specific Gravity, Urine 1.020 1.005 - 1.030   pH 5.0 5.0 - 8.0   Glucose, UA NEGATIVE NEGATIVE mg/dL   Hgb urine dipstick NEGATIVE NEGATIVE   Bilirubin Urine NEGATIVE NEGATIVE   Ketones, ur NEGATIVE NEGATIVE mg/dL   Protein, ur NEGATIVE NEGATIVE mg/dL   Nitrite NEGATIVE NEGATIVE   Leukocytes,Ua MODERATE (A) NEGATIVE   RBC / HPF 0-5 0 - 5  RBC/hpf   WBC, UA 21-50 0 - 5 WBC/hpf   Bacteria, UA RARE (A) NONE SEEN   Squamous Epithelial / HPF 6-10 0 - 5 /HPF    Comment: Performed at [facility], [address] [place], [place] [address]   No results found.  Pending Labs Unresulted Labs (From admission, onward)     Start     Ordered   03/03/23 0500  CBC  Tomorrow morning,   R        03/02/23 2021   03/03/23 0500  Basic metabolic panel  Tomorrow morning,   R        03/02/23 2021   03/02/23 1610  Gastrointestinal Panel by PCR , Stool  (Gastrointestinal Panel by PCR, Stool                                                                                                                                                     **Does Not include CLOSTRIDIUM DIFFICILE testing. **If CDIFF testing is needed, place order from the "C Difficile Testing" order set.**)  Once,   URGENT        03/02/23  1609   03/02/23 1610  C Difficile Quick Screen w PCR reflex  (C Difficile quick screen w PCR reflex panel )  Once, for 24 hours,   URGENT       References:    CDiff Information Tool   03/02/23 1609            Vitals/Pain Today's Vitals   03/02/23 1205 03/02/23 1555 03/02/23 1933 03/02/23 2037  BP: (!) 116/57 (!) 101/45 (!) 100/37 (!) 105/42  Pulse: 90 (!) 110 93 94  Resp: 16 18 18 19   Temp: 98 F (36.7 C) 98 F (36.7 C) 98.7 F (37.1 C)   TempSrc:  Oral Oral   SpO2: 100% 100% 100% 100%  Weight:      Height:      PainSc:   0-No pain 4     Isolation Precautions Enteric precautions (UV disinfection)  Medications Medications  lactated ringers infusion (has no administration in time range)  azithromycin (ZITHROMAX) 500 mg in sodium chloride 0.9 % 250 mL IVPB (500 mg Intravenous New Bag/Given 03/02/23 2027)  allopurinol (ZYLOPRIM) tablet 400 mg (has no administration in time range)  atorvastatin (LIPITOR) tablet 80 mg (has no administration in time range)  ezetimibe (ZETIA) tablet 10 mg (has no administration in time range)   zolpidem (AMBIEN) tablet 5 mg (has no administration in time range)  apixaban (ELIQUIS) tablet 5 mg (has no administration in time range)  aspirin EC tablet 81 mg (has no administration in time range)  metoprolol tartrate (LOPRESSOR) tablet 75 mg (has no administration in time range)  lactated ringers bolus 1,000 mL (1,000 mLs Intravenous Bolus 03/02/23 1623)    Mobility non-ambulatory     Focused Assessments     R Recommendations: See Admitting Provider Note  Report given to:   Additional Notes: Pt uses a motorized wheel chair to move around, non ambulatory

## 2023-03-02 NOTE — ED Notes (Signed)
Pt verbalizes understanding and has a urine cup and label but states she has not been able to urinate because she is dehydrated MD aware

## 2023-03-02 NOTE — H&P (Addendum)
History and Physical    Patient: Cindy Holmes:[id number] DOB: 10-Jan-1951 DOA: 03/02/2023 DOS: the patient was seen and examined on 03/02/2023 PCP: [name], MD  Patient coming from: Home  Chief Complaint:  Chief Complaint  Patient presents with   Emesis   Diarrhea   HPI: Cindy Holmes is a 72 y.o. female with medical history significant of hypertension, rheumatoid arthritis on immunosuppressives, CAD, chronic HFpEF, and history of CVA who presents with persistent diarrhea, nausea and vomiting.   She was just hospitalized from 7/24-7/27 for acute on chronic diarrhea and AKI. She reports at that time diarrhea for at least 6 months with increase in frequency for the past month up to 8-10 stools daily. Stool PCR positive for enteropathogenic E. Coli and she was treated with 5 days course of Azithromcyin. GI was consulted and discussed colonoscopy with her which she wanted to pursue outpatient.   Today she reports symptoms of diarrhea for up to 4 and a half months. Symptoms continued to worsen despite antibiotics treatment up to 7 episodes daily. Diarrhea comes with such urgency that she is soiling herself and can no longer hold it. She has no appetite and has not drank or had much to eat for days. Usually dry heaves or gags when she tries to get anything down. So weak she can barely hold her head up. Denies any travels or fever. Drinks bottled water. No recent alcohol use.   In the ED, she was afebrile, HR 90-110, BP 101/45 on room air. Leukocytosis of 10.7.  Creatinine had elevated to 1.83 from prior of 0.96.   She was given IV fluids and hospitalist consulted for admission.    Review of Systems: As mentioned in the history of present illness. All other systems reviewed and are negative. Past Medical History:  Diagnosis Date   CAD (coronary artery disease)    a. PTCA LAD 2001/cath 2002-prox and mid LAD stents patent, PTCA ostium Dx 1 b. cath 03/2016: patent LAD stent with  less than 40% in-stent restenosis, 10-30% stenosis of D1 and distal LAD with continued medical management recommended. 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   Past Surgical History:  Procedure Laterality Date   CARDIAC CATHETERIZATION     with stent placement in 2001   CARDIAC CATHETERIZATION N/A 03/12/2016   Procedure: Left Heart Cath and Coronary Angiography;  Surgeon: Yvonne Kendall, MD;  Location: [facility] INVASIVE CV LAB;  Service: Cardiovascular;  Laterality: N/A;   CARDIAC CATHETERIZATION N/A 03/12/2016   Procedure: Intravascular Pressure Wire/FFR Study;  Surgeon: Yvonne Kendall, MD;  Location: [facility] INVASIVE CV LAB;  Service: Cardiovascular;  Laterality: N/A;   CHOLECYSTECTOMY     CORONARY ANGIOPLASTY     2002  ESOPHAGOGASTRODUODENOSCOPY N/A 12/03/2016   Procedure: ESOPHAGOGASTRODUODENOSCOPY (EGD);  Surgeon: Jeani Hawking, MD;  Location: [facility] ENDOSCOPY;  Service: Endoscopy;  Laterality: N/A;   laproscopic adjustable gastric  banding     with APS standard system.    OPEN REDUCTION INTERNAL FIXATION (ORIF) DISTAL RADIAL FRACTURE Right 05/15/2018   Procedure: OPEN REDUCTION INTERNAL FIXATION (ORIF) DISTAL RADIAL FRACTURE;  Surgeon: Roby Lofts, MD;  Location: [facility] OR;  Service: Orthopedics;  Laterality: Right;   ORIF ANKLE FRACTURE Right  05/15/2018   Procedure: OPEN REDUCTION INTERNAL FIXATION (ORIF) ANKLE FRACTURE;  Surgeon: Roby Lofts, MD;  Location: [facility] OR;  Service: Orthopedics;  Laterality: Right;   ORIF TIBIA PLATEAU Right 05/15/2018   Procedure: OPEN REDUCTION INTERNAL FIXATION (ORIF) TIBIAL PLATEAU;  Surgeon: Roby Lofts, MD;  Location: [facility] OR;  Service: Orthopedics;  Laterality: Right;   Pt has 3 stents     sept 2001   SHOULDER ARTHROSCOPY WITH SUBACROMIAL DECOMPRESSION, ROTATOR CUFF REPAIR AND BICEP TENDON REPAIR Right 11/16/2013   Procedure: RIGHT SHOULDER ARTHROSCOPY, EXTENSIVE DEBRIDEMENT, ROTATOR CUFF REPAIR ;  Surgeon: Eulas Post, MD;  Location: [facility];  Service: Orthopedics;  Laterality: Right;   TUBAL LIGATION     Social History:  reports that she has never smoked. She has never used smokeless tobacco. She reports that she does not drink alcohol and does not use drugs.  Allergies  Allergen Reactions   Codeine Phosphate Shortness Of Breath   Morphine And Codeine Nausea And Vomiting   Amoxicillin Nausea And Vomiting   Penicillins Nausea And Vomiting    Has patient had a PCN reaction causing immediate rash, facial/tongue/throat swelling, SOB or lightheadedness with hypotension:YES Has patient had a PCN reaction causing severe rash involving mucus membranes or skin necrosis: NO Has patient had a PCN reaction that required hospitalization NO Has patient had a PCN reaction occurring within the last 10 years: NO If all of the above answers are "NO", then may proceed with Cephalosporin use.   Methotrexate Nausea Only   Lidocaine Rash    Reaction to lidocaine patch    Family History  Problem Relation Age of Onset   Heart attack Mother    Heart disease Mother    Arthritis/Rheumatoid Father    Cirrhosis Father        hepatic cirrhosis   Breast cancer Sister 12   Diabetes Sister    Heart disease Maternal Aunt    Heart attack Maternal Aunt    Throat cancer Maternal Aunt    Heart  attack Maternal Uncle    Colon cancer Maternal Grandmother    Throat cancer Maternal Grandfather    Diabetes Brother    Heart disease Brother    Heart disease Brother    Cancer Other        family history   Heart attack Other        family history    Prior to Admission medications   Medication Sig Start Date End Date Taking? Authorizing Provider  acetaminophen (TYLENOL) 500 MG tablet Take 1,000 mg by mouth every 6 (six) hours as needed for headache (pain).    [provider]  acidophilus (RISAQUAD) CAPS capsule Take 2 capsules by mouth daily. 02/05/23   Joseph Art, DO  allopurinol (ZYLOPRIM) 100 MG tablet Take 400 mg by mouth at bedtime.    [provider]  apixaban (ELIQUIS) 5 MG TABS tablet Take 1 tablet (5 mg total) by mouth 2 (two) times daily. 12/10/20  Meriam Sprague, MD  aspirin EC 81 MG tablet Take 81 mg by mouth at bedtime. Swallow whole.    [provider]  atorvastatin (LIPITOR) 40 MG tablet Take 2 tablets (80 mg total) by mouth daily 12/10/20   Filbert Schilder, NP  azithromycin (ZITHROMAX) 500 MG tablet Take 1 tablet (500 mg total) by mouth daily. 02/05/23   Joseph Art, DO  ezetimibe (ZETIA) 10 MG tablet Take 1 tablet (10 mg total) by mouth daily. 12/10/20   Georgie Chard D, NP  folic acid (FOLVITE) 1 MG tablet Take 1 mg by mouth at bedtime. 03/05/14   [provider]  furosemide (LASIX) 20 MG tablet Take 3 tablets (60 mg total) by mouth in the morning, then take 2 tablets (40 mg total) by mouth in the afternoon, everyday. 02/07/23   Joseph Art, DO  hydroxychloroquine (PLAQUENIL) 200 MG tablet Take 200 mg by mouth at bedtime. 09/03/20   [provider]  loperamide (IMODIUM) 2 MG capsule Take 1 capsule (2 mg total) by mouth as needed for diarrhea or loose stools. 02/05/23   Joseph Art, DO  methotrexate (RHEUMATREX) 2.5 MG tablet Take 10 mg by mouth every Sunday. 08/08/20   [provider]  metoprolol tartrate  (LOPRESSOR) 25 MG tablet TAKE 1 TABLET BY MOUTH TWICE  DAILY ALONG WITH 50MG  TABLET FOR A TOTAL OF 75MG  TWICE DAILY Patient taking differently: Take 25 mg by mouth 2 (two) times daily. Take a total of 75 mg twice daily 01/21/23   Tereso Newcomer T, PA-C  metoprolol tartrate (LOPRESSOR) 50 MG tablet TAKE 1 TABLET BY MOUTH TWICE  DAILY WITH 25MG  TABLET FOR A  TOTAL DOSE OF 75MG  TWICE DAILY Patient taking differently: Take 50 mg by mouth 2 (two) times daily. Take for a total dose of 75 mg twice daily. 01/21/23   Tereso Newcomer T, PA-C  Omega-3 Fatty Acids (FISH OIL) 1000 MG CAPS Take 1,000 mg by mouth at bedtime.    [provider]  Potassium Chloride ER 20 MEQ TBCR Take 10 mEq by mouth daily. 12/10/20   Georgie Chard D, NP  ramipril (ALTACE) 2.5 MG capsule Take 1 capsule (2.5 mg total) by mouth daily. 02/09/23   Joseph Art, DO  spironolactone (ALDACTONE) 25 MG tablet Take 1 tablet (25 mg total) by mouth daily. 02/11/23   Joseph Art, DO  zolpidem (AMBIEN) 5 MG tablet Take 5 mg by mouth at bedtime. 10/13/20   [provider]    Physical Exam: Vitals:   03/02/23 1115 03/02/23 1205 03/02/23 1555 03/02/23 1933  BP:  (!) 116/57 (!) 101/45 (!) 100/37  Pulse:  90 (!) 110 93  Resp:  16 18 18   Temp:  98 F (36.7 C) 98 F (36.7 C) 98.7 F (37.1 C)  TempSrc:   Oral Oral  SpO2:  100% 100% 100%  Weight: 104.3 kg     Height: 5\' 3"  (1.6 m)      Constitutional: NAD, calm, comfortable, obese female laying in bed asleep and awoke easily to voice Eyes: lids and conjunctivae normal ENMT: Mucous membranes are dry Neck: normal, suppl Respiratory: clear to auscultation bilaterally, no wheezing, no crackles. Normal respiratory effort. No accessory muscle use.  Cardiovascular: Regular rate and rhythm, no murmurs / rubs / gallops. No extremity edema.  Abdomen: soft, non-distended, no tenderness, no masses palpated. No rebound tenderness, guarding or rigidity. Bowel sounds positive.   Musculoskeletal: no clubbing / cyanosis. No joint deformity upper  and lower extremities. Good ROM, no contractures. Normal muscle tone.  Skin: no rashes, lesions, ulcers. No induration Neurologic: CN 2-12 grossly intact.  Psychiatric: Normal judgment and insight. Alert and oriented x 3. Normal mood.   Data Reviewed:  See HPI  Assessment and Plan: * AKI (acute kidney injury) (HCC) -Creatinine had elevated to 1.83 from prior of 0.96.  -keep on continuous LR  -hold home Ramipril and spironolactone  Diarrhea Acute on chronic diarrhea -several months of persistent symptoms with PCR positive for enteropathogenic E. Coli during recent hospitalization. Symptoms continue to worsen despite 5 day course of Azithromycin.  -will resume Azithromycin IV for more extended course pending repeat stool study and C.diff testing -pt saw GI at previous admission and declined inpatient colonoscopy. She is more open to having it done inpatient although is concerned that she cannot hold down bowel prep. Will message GI Dr. Elnoria Howard to evaluate in the morning.   Obesity, Class III, BMI 40-49.9 (morbid obesity) (HCC) BMI of 40  PAF (paroxysmal atrial fibrillation) (HCC) -continue Eliquis -continue metoprolol  Rheumatoid arthritis (HCC) -pt is on methotrexate and plaquenil. Will hold now with ongoing infection.   Hyperlipidemia -Continue statin  Essential hypertension -controlled -holding Ramipril, spirolactone due to AKI -continue metoprolol      Advance Care Planning:   Code Status: Full Code   Consults: GI Dr.Hung- message sent in Epic  Family Communication: none at bedside  Severity of Illness: The appropriate patient status for this patient is INPATIENT. Inpatient status is judged to be reasonable and necessary in order to provide the required intensity of service to ensure the patient's safety. The patient's presenting symptoms, physical exam findings, and initial radiographic and laboratory  data in the context of their chronic comorbidities is felt to place them at high risk for further clinical deterioration. Furthermore, it is not anticipated that the patient will be medically stable for discharge from the hospital within 2 midnights of admission.   * I certify that at the point of admission it is my clinical judgment that the patient will require inpatient hospital care spanning beyond 2 midnights from the point of admission due to high intensity of service, high risk for further deterioration and high frequency of surveillance required.*  Author: Anselm Jungling, DO 03/02/2023 8:37 PM  For on call review [email or web address].

## 2023-03-02 NOTE — Assessment & Plan Note (Signed)
-  Creatinine had elevated to 1.83 from prior of 0.96.  -keep on continuous LR  -hold home Ramipril and spironolactone

## 2023-03-02 NOTE — ED Notes (Addendum)
Pt diastolic in the 30's Ching Tu DO notified, will continue to monitor. Pt refusing ongoing BP monitoring.

## 2023-03-02 NOTE — Assessment & Plan Note (Signed)
-  pt is on methotrexate and plaquenil. Will hold now with ongoing infection.

## 2023-03-02 NOTE — ED Triage Notes (Signed)
Pt c/o N/V/Dx3wks.

## 2023-03-02 NOTE — ED Notes (Signed)
Pt said that she is unable to go to the restroom when asked by lab. Pt said that she will try again later, she has the cup with her. Pt encouraged to go to restroom when she can.

## 2023-03-02 NOTE — ED Notes (Signed)
Pt verbalizes understanding that when has a BM we need to send to lab

## 2023-03-02 NOTE — ED Notes (Signed)
Dr Benita Gutter aware of pt's BP, instructions given via Epic message to transfer pt to unit.

## 2023-03-02 NOTE — Assessment & Plan Note (Addendum)
-  continue Eliquis -continue metoprolol

## 2023-03-02 NOTE — ED Notes (Signed)
MD aware pt has been moved to hallway

## 2023-03-02 NOTE — Assessment & Plan Note (Addendum)
Acute on chronic diarrhea -several months of persistent symptoms with PCR positive for enteropathogenic E. Coli during recent hospitalization. Symptoms continue to worsen despite 5 day course of Azithromycin.  -will resume Azithromycin IV for more extended course pending repeat stool study and C.diff testing -pt saw GI at previous admission and declined inpatient colonoscopy. She is more open to having it done inpatient although is concerned that she cannot hold down bowel prep. Will message GI Dr. Elnoria Howard to evaluate in the morning.

## 2023-03-02 NOTE — ED Provider Notes (Signed)
[place] EMERGENCY DEPARTMENT AT [facility] Provider Note   CSN: [id number] Arrival date & time: 03/02/23  1102     History  Chief Complaint  Patient presents with   Emesis   Diarrhea    Cindy Holmes is a 72 y.o. female.   Emesis Associated symptoms: diarrhea   Diarrhea Associated symptoms: vomiting   Patient presents with emesis diarrhea and generalized weakness.  Has been going now for a few weeks.  Recent admission for the same and had acute kidney injury.  Reportedly is been able to eat very little at home.  States both no appetite and anything she eats will either come up or come out from below.  States has had green stool.  Had enterotoxigenic E. coli and stool studies around 3 weeks ago when I reviewed the note.  Has had azithromycin and states really nothing is changed.  Does have some diffuse abdominal pain but states this is also unchanged.  Denies fevers.  States he would be unable to get up and move around.  Does have a history of rheumatoid arthritis.    Past Medical History:  Diagnosis Date   CAD (coronary artery disease)    a. PTCA LAD 2001/cath 2002-prox and mid LAD stents patent, PTCA ostium Dx 1 b. cath 03/2016: patent LAD stent with less than 40% in-stent restenosis, 10-30% stenosis of D1 and distal LAD with continued medical management recommended. 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    Home Medications Prior to Admission medications   Medication Sig Start Date End Date Taking? Authorizing Provider  acetaminophen (TYLENOL) 500 MG tablet Take 1,000 mg by mouth every 6 (six) hours as needed for headache (pain).    [provider]  acidophilus (RISAQUAD) CAPS capsule Take 2 capsules by mouth daily. 02/05/23   Joseph Art, DO  allopurinol (ZYLOPRIM) 100 MG tablet Take 400 mg by mouth at bedtime.    [provider]  apixaban (ELIQUIS) 5 MG TABS tablet Take 1 tablet (5 mg total) by mouth 2 (two) times daily. 12/10/20   Meriam Sprague, MD  aspirin EC 81 MG tablet Take 81 mg by mouth at bedtime. Swallow whole.    [provider]  atorvastatin (LIPITOR) 40 MG tablet Take 2 tablets (80 mg total) by mouth daily 12/10/20   Filbert Schilder, NP  azithromycin (ZITHROMAX) 500 MG tablet Take 1 tablet (500 mg total) by mouth daily. 02/05/23   Joseph Art, DO  ezetimibe (ZETIA) 10 MG tablet Take 1 tablet (10 mg total) by mouth daily. 12/10/20   Georgie Chard D, NP  folic acid (FOLVITE) 1 MG tablet Take 1 mg by mouth at bedtime. 03/05/14  [provider]  furosemide (LASIX) 20 MG tablet Take 3 tablets (60 mg total) by mouth in the morning, then take 2 tablets (40 mg total) by mouth in the afternoon, everyday. 02/07/23   Joseph Art, DO  hydroxychloroquine (PLAQUENIL) 200 MG tablet Take 200 mg by mouth at bedtime. 09/03/20   [provider]  loperamide (IMODIUM) 2 MG capsule Take 1 capsule (2 mg total) by mouth as needed for diarrhea or loose stools. 02/05/23   Joseph Art, DO  methotrexate (RHEUMATREX) 2.5 MG tablet Take 10 mg by mouth every Sunday. 08/08/20   [provider]  metoprolol tartrate (LOPRESSOR) 25 MG tablet TAKE 1 TABLET BY MOUTH TWICE  DAILY ALONG WITH 50MG  TABLET FOR A TOTAL OF 75MG  TWICE DAILY Patient taking  differently: Take 25 mg by mouth 2 (two) times daily. Take a total of 75 mg twice daily 01/21/23   Tereso Newcomer T, PA-C  metoprolol tartrate (LOPRESSOR) 50 MG tablet TAKE 1 TABLET BY MOUTH TWICE  DAILY WITH 25MG  TABLET FOR A  TOTAL DOSE OF 75MG  TWICE DAILY Patient taking differently: Take 50 mg by mouth 2 (two) times daily. Take for a total dose of 75 mg twice daily. 01/21/23   Tereso Newcomer T, PA-C  Omega-3 Fatty Acids (FISH OIL) 1000 MG CAPS Take 1,000 mg by mouth at bedtime.    [provider]  Potassium Chloride ER 20 MEQ TBCR Take 10 mEq by mouth daily. 12/10/20   Georgie Chard D, NP  ramipril (ALTACE) 2.5 MG capsule Take 1 capsule (2.5 mg total) by mouth daily. 02/09/23   Joseph Art, DO  spironolactone (ALDACTONE) 25 MG tablet Take 1 tablet (25 mg total) by mouth daily. 02/11/23   Joseph Art, DO  zolpidem (AMBIEN) 5 MG tablet Take 5 mg by mouth at bedtime. 10/13/20   [provider]      Allergies    Codeine phosphate, Morphine and codeine, Amoxicillin, Penicillins, Methotrexate, and Lidocaine    Review of Systems   Review of Systems  Gastrointestinal:  Positive for diarrhea and vomiting.    Physical Exam Updated Vital Signs BP (!) 101/45   Pulse (!) 110   Temp 98 F (36.7 C) (Oral)   Resp 18   Ht 5\' 3"  (1.6 m)   Wt 104.3 kg   SpO2 100%   BMI 40.73 kg/m  Physical Exam Vitals reviewed.  HENT:     Head: Atraumatic.  Cardiovascular:     Rate and Rhythm: Tachycardia present.  Abdominal:     Tenderness: There is abdominal tenderness.     Comments: Diffuse tenderness without rebound or guarding.  No hernia palpated.  Musculoskeletal:        General: No tenderness.  Skin:    Capillary Refill: Capillary refill takes less than 2 seconds.  Neurological:     Mental Status: She is alert and oriented to person, place, and time.     ED Results / Procedures / Treatments   Labs (all labs ordered are listed, but only abnormal results are displayed) Labs  Reviewed  COMPREHENSIVE METABOLIC PANEL - Abnormal; Notable for the following components:      Result Value   Sodium 132 (*)    CO2 18 (*)    Glucose, Bld 113 (*)    BUN 36 (*)    Creatinine, Ser 1.83 (*)    Calcium 8.4 (*)    Total Protein 5.6 (*)    Albumin 2.6 (*)  AST 10 (*)    GFR, Estimated 29 (*)    All other components within normal limits  CBC WITH DIFFERENTIAL/PLATELET - Abnormal; Notable for the following components:   WBC 10.7 (*)    RBC 3.56 (*)    Hemoglobin 10.9 (*)    HCT 33.8 (*)    RDW 17.9 (*)    All other components within normal limits  GASTROINTESTINAL PANEL BY PCR, STOOL (REPLACES STOOL CULTURE)  C DIFFICILE QUICK SCREEN W PCR REFLEX    LIPASE, BLOOD  URINALYSIS, ROUTINE W REFLEX MICROSCOPIC    EKG None  Radiology No results found.  Procedures Procedures    Medications Ordered in ED Medications  lactated ringers bolus 1,000 mL (1,000 mLs Intravenous Bolus 03/02/23 1623)    ED Course/ Medical Decision Making/ A&P                                 Medical Decision Making Amount and/or Complexity of Data Reviewed Labs: ordered.   Patient with nausea vomiting diarrhea.  Has had for weeks now.  Is hypotensive.  Generally weak.  Reportedly been unable to get up and walk around.  Does have an acute kidney injury with creatinine now up to 1.8 with normal baseline.  Reviewed note from recent discharge.  Will give fluid bolus by think likely require admission to the hospital.  Will recheck stool studies.  Do not think need intra-abdominal imaging at this time.  Fluid bolus given.  Still feeling weak.  I think with acute kidney injury and comorbidities along with recent admission to hospital benefit from admission to the hospital.  Will discuss with hospitalist.          Final Clinical Impression(s) / ED Diagnoses Final diagnoses:  Dehydration  AKI (acute kidney injury) [facility])    Rx / DC Orders ED Discharge Orders     None          Benjiman Core, MD 03/02/23 [phone]

## 2023-03-02 NOTE — Assessment & Plan Note (Signed)
Continue statin. 

## 2023-03-02 NOTE — Assessment & Plan Note (Addendum)
-  controlled -holding Ramipril, spirolactone due to AKI -continue metoprolol

## 2023-03-03 ENCOUNTER — Inpatient Hospital Stay (HOSPITAL_COMMUNITY): Payer: Medicare Other

## 2023-03-03 DIAGNOSIS — E782 Mixed hyperlipidemia: Secondary | ICD-10-CM | POA: Diagnosis not present

## 2023-03-03 DIAGNOSIS — R197 Diarrhea, unspecified: Secondary | ICD-10-CM | POA: Diagnosis not present

## 2023-03-03 DIAGNOSIS — M7989 Other specified soft tissue disorders: Secondary | ICD-10-CM | POA: Diagnosis not present

## 2023-03-03 DIAGNOSIS — I10 Essential (primary) hypertension: Secondary | ICD-10-CM | POA: Diagnosis not present

## 2023-03-03 DIAGNOSIS — N179 Acute kidney failure, unspecified: Secondary | ICD-10-CM | POA: Diagnosis not present

## 2023-03-03 LAB — C DIFFICILE QUICK SCREEN W PCR REFLEX
C Diff antigen: NEGATIVE
C Diff interpretation: NOT DETECTED
C Diff toxin: NEGATIVE

## 2023-03-03 LAB — MAGNESIUM: Magnesium: 1.5 mg/dL — ABNORMAL LOW (ref 1.7–2.4)

## 2023-03-03 LAB — CBC
HCT: 29.1 % — ABNORMAL LOW (ref 36.0–46.0)
Hemoglobin: 9.2 g/dL — ABNORMAL LOW (ref 12.0–15.0)
MCH: 29.4 pg (ref 26.0–34.0)
MCHC: 31.6 g/dL (ref 30.0–36.0)
MCV: 93 fL (ref 80.0–100.0)
Platelets: 260 10*3/uL (ref 150–400)
RBC: 3.13 MIL/uL — ABNORMAL LOW (ref 3.87–5.11)
RDW: 18.2 % — ABNORMAL HIGH (ref 11.5–15.5)
WBC: 7.5 10*3/uL (ref 4.0–10.5)
nRBC: 0 % (ref 0.0–0.2)

## 2023-03-03 LAB — BASIC METABOLIC PANEL
Anion gap: 11 (ref 5–15)
BUN: 30 mg/dL — ABNORMAL HIGH (ref 8–23)
CO2: 16 mmol/L — ABNORMAL LOW (ref 22–32)
Calcium: 7.7 mg/dL — ABNORMAL LOW (ref 8.9–10.3)
Chloride: 105 mmol/L (ref 98–111)
Creatinine, Ser: 1.24 mg/dL — ABNORMAL HIGH (ref 0.44–1.00)
GFR, Estimated: 47 mL/min — ABNORMAL LOW (ref >60)
Glucose, Bld: 111 mg/dL — ABNORMAL HIGH (ref 70–99)
Potassium: 3.2 mmol/L — ABNORMAL LOW (ref 3.5–5.1)
Sodium: 132 mmol/L — ABNORMAL LOW (ref 135–145)

## 2023-03-03 MED ORDER — SODIUM BICARBONATE 650 MG PO TABS
650.0000 mg | ORAL_TABLET | Freq: Two times a day (BID) | ORAL | Status: DC
  Administered 2023-03-03 – 2023-03-08 (×10): 650 mg via ORAL
  Filled 2023-03-03 (×11): qty 1

## 2023-03-03 MED ORDER — POLYETHYLENE GLYCOL 3350 17 GM/SCOOP PO POWD
1.0000 | Freq: Once | ORAL | Status: AC
  Administered 2023-03-03: 255 g via ORAL
  Filled 2023-03-03: qty 255

## 2023-03-03 MED ORDER — FUROSEMIDE 40 MG PO TABS
40.0000 mg | ORAL_TABLET | Freq: Every day | ORAL | Status: DC
  Administered 2023-03-04: 40 mg via ORAL
  Filled 2023-03-03 (×2): qty 1

## 2023-03-03 MED ORDER — IOHEXOL 9 MG/ML PO SOLN
500.0000 mL | ORAL | Status: AC
  Administered 2023-03-03 (×2): 500 mL via ORAL

## 2023-03-03 MED ORDER — FUROSEMIDE 20 MG PO TABS
20.0000 mg | ORAL_TABLET | Freq: Every day | ORAL | Status: DC
  Administered 2023-03-03: 20 mg via ORAL
  Filled 2023-03-03: qty 1

## 2023-03-03 MED ORDER — POTASSIUM CHLORIDE CRYS ER 20 MEQ PO TBCR
40.0000 meq | EXTENDED_RELEASE_TABLET | Freq: Two times a day (BID) | ORAL | Status: DC
  Administered 2023-03-03 – 2023-03-04 (×3): 40 meq via ORAL
  Filled 2023-03-03 (×3): qty 2

## 2023-03-03 MED ORDER — PROCHLORPERAZINE EDISYLATE 10 MG/2ML IJ SOLN
5.0000 mg | INTRAMUSCULAR | Status: AC | PRN
  Administered 2023-03-03 (×2): 5 mg via INTRAVENOUS
  Filled 2023-03-03 (×2): qty 2

## 2023-03-03 MED ORDER — SODIUM CHLORIDE 0.9 % IV SOLN: INTRAVENOUS | Status: DC

## 2023-03-03 NOTE — Progress Notes (Signed)
Bilateral lower extremity venous study completed.   Preliminary results relayed to MD and RN.  Please see CV Procedures for preliminary results.  Statia Burdick, RVT  3:50 PM 03/03/23

## 2023-03-03 NOTE — TOC CM/SW Note (Signed)
Transition of Care [facility]) - Inpatient Brief Assessment   Patient Details  Name: [name] MRN: [id number] Date of Birth: 1950-08-08  Transition of Care [facility]) CM/SW Contact:    Tom-Johnson, Hershal Coria, RN Phone Number: 03/03/2023, 3:09 PM   Clinical Narrative:  Patient recently discharged. Presented to the ED with Nausea, Vomiting and Diarrhea x 3 weeks. Admitted for AKI. C-Diff panel negative. GI Panel pending. On IV abx.   From home with husband. Has all necessary DME's at home. No changes to PCP and Pharmacy from previous assessment.   Patient states she does not want any Home Health disciplines to her home.   CM will continue to follow as patient progresses with care towards discharge.     Transition of Care Asessment: Insurance and Status: Insurance coverage has been reviewed Patient has primary care physician: Yes Home environment has been reviewed: Yes Prior level of function:: Modified assistance with motorized wheelchair Prior/Current Home Services: No current home services (Declined home health) Social Determinants of Health Reivew: SDOH reviewed no interventions necessary Readmission risk has been reviewed: Yes Transition of care needs: transition of care needs identified, TOC will continue to follow

## 2023-03-03 NOTE — Plan of Care (Signed)
  Problem: Education: Goal: Knowledge of General Education information will improve Description: Including pain rating scale, medication(s)/side effects and non-pharmacologic comfort measures Outcome: Progressing   Problem: Health Behavior/Discharge Planning: Goal: Ability to manage health-related needs will improve Outcome: Progressing   Problem: Clinical Measurements: Goal: Ability to maintain clinical measurements within normal limits will improve Outcome: Progressing Goal: Will remain free from infection Outcome: Progressing Goal: Diagnostic test results will improve Outcome: Progressing Goal: Respiratory complications will improve Outcome: Progressing Goal: Cardiovascular complication will be avoided Outcome: Progressing   Problem: Activity: Goal: Risk for activity intolerance will decrease Outcome: Progressing   Problem: Nutrition: Goal: Adequate nutrition will be maintained Outcome: Progressing   Problem: Coping: Goal: Level of anxiety will decrease Outcome: Progressing    Problem: Elimination: Goal: Will not experience complications related to bowel motility 03/03/2023 1247 by Juluis Mire, RN Outcome: Not Progressing 03/03/2023 1247 by Juluis Mire, RN Outcome: Progressing Goal: Will not experience complications related to urinary retention 03/03/2023 1247 by Juluis Mire, RN Outcome: Progressing 03/03/2023 1247 by Juluis Mire, RN Outcome: Progressing   Problem: Pain Managment: Goal: General experience of comfort will improve 03/03/2023 1247 by Juluis Mire, RN Outcome: Progressing 03/03/2023 1247 by Juluis Mire, RN Outcome: Progressing   Problem: Safety: Goal: Ability to remain free from injury will improve 03/03/2023 1247 by Juluis Mire, RN Outcome: Progressing 03/03/2023 1247 by Juluis Mire, RN Outcome: Progressing   Problem: Skin Integrity: Goal: Risk for impaired skin integrity will  decrease 03/03/2023 1247 by Juluis Mire, RN Outcome: Progressing 03/03/2023 1247 by Juluis Mire, RN Outcome: Progressing

## 2023-03-03 NOTE — Progress Notes (Signed)
New Admission Note:  Arrival Method: By bed from ED  Mental Orientation: Alert and oriented x 4, irritable Telemetry: 16, CCMD notified Assessment: Completed Skin: Completed, refer to flowsheets IV: R A.C.  Pain: Denies Tubes: None Safety Measures: Safety Fall Prevention Plan was given, discussed and signed. Admission: Completed [facility] Orientation: Patient has been orientated to the room, unit and the staff. Family: None  Orders have been reviewed and implemented. Will continue to monitor the patient. Call light has been placed within reach and bed alarm has been activated.   Franky Macho, RN  Phone Number: [phone]

## 2023-03-03 NOTE — H&P (View-Only) (Signed)
 Subjective: See below.  Objective: Vital signs in last 24 hours: Temp:  [98 F (36.7 C)-98.7 F (37.1 C)] 98 F (36.7 C) (08/22 0455) Pulse Rate:  [90-110] 97 (08/22 0455) Resp:  [17-19] 19 (08/22 0455) BP: (100-118)/(24-47) 100/42 (08/22 0455) SpO2:  [97 %-100 %] 97 % (08/22 0455) Last BM Date : 03/03/23  Intake/Output from previous day: 08/21 0701 - 08/22 0700 In: 579.8 [P.O.:100; I.V.:229.8; IV Piggyback:250] Out: -  Intake/Output this shift: Total I/O In: 480 [P.O.:480] Out: 0   General appearance: alert and no distress GI: soft, non-tender; bowel sounds normal; no masses,  no organomegaly  Lab Results: Recent Labs    03/02/23 1119 03/03/23 0814  WBC 10.7* 7.5  HGB 10.9* 9.2*  HCT 33.8* 29.1*  PLT 280 260   BMET Recent Labs    03/02/23 1119 03/03/23 0814  NA 132* 132*  K 3.9 3.2*  CL 102 105  CO2 18* 16*  GLUCOSE 113* 111*  BUN 36* 30*  CREATININE 1.83* 1.24*  CALCIUM 8.4* 7.7*   LFT Recent Labs    03/02/23 1119  PROT 5.6*  ALBUMIN 2.6*  AST 10*  ALT 9  ALKPHOS 65  BILITOT 0.8   PT/INR No results for input(s): "LABPROT", "INR" in the last 72 hours. Hepatitis Panel No results for input(s): "HEPBSAG", "HCVAB", "HEPAIGM", "HEPBIGM" in the last 72 hours. C-Diff Recent Labs    03/02/23 1610  CDIFFTOX NEGATIVE   Fecal Lactopherrin No results for input(s): "FECLLACTOFRN" in the last 72 hours.  Studies/Results: No results found.  Medications: Scheduled:  allopurinol  400 mg Oral QHS   aspirin EC  81 mg Oral QHS   atorvastatin  80 mg Oral Daily   ezetimibe  10 mg Oral Daily   [START ON 03/04/2023] furosemide  40 mg Oral Daily   iohexol  500 mL Oral Q1H   metoprolol tartrate  75 mg Oral BID   polyethylene glycol powder  1 Container Oral Once   potassium chloride  40 mEq Oral BID   sodium bicarbonate  650 mg Oral BID   zolpidem  5 mg Oral QHS   Continuous:  azithromycin Stopped (03/02/23 2130)    Assessment/Plan: 1) Chronic  diarrhea. 2) AKI.   The patient was diagnosed and treated for EPEC three weeks ago.  She completed a 5 days course of azithromycin, but it did not resolve her diarrhea.  She was hospitalized at the time of diagnosis and she appeared to be improving after three days of treatment in the hospital.  The patient was offered a colonoscopy in the hospital, but she declined.  Instead she opted to have an outpatient follow up, but she never followed up.  The patient reports that she was so weak and not able to eat anything over these past three weeks.  She does not know why she did not present back to the hospital much sooner.  At this time she is willing to undergo the colonoscopy.  Plan: 1) Colonoscopy with biopsies tomorrow. 2) Continue with IV hydration.  LOS: 1 day   Kruze Atchley D 03/03/2023, 3:33 PM

## 2023-03-03 NOTE — Progress Notes (Signed)
[facility]                                                                               Cindy Holmes, is a 72 y.o. female, DOB - 1951-06-13, ZOX:[id number] Admit date - 03/02/2023    Outpatient Primary MD for the patient is [name], MD  LOS - 1  days    Brief summary    Cindy Holmes is a 72 y.o. female with medical history significant of hypertension, rheumatoid arthritis on immunosuppressives, CAD, chronic HFpEF, and history of CVA who presents with persistent diarrhea, nausea and vomiting.   Assessment & Plan    Assessment and Plan: * AKI (acute kidney injury) (HCC) Probably sec to persistent diarrhea with poor oral intake.  Much improved.   Diarrhea Acute on chronic diarrhea -several months of persistent symptoms with PCR positive for enteropathogenic E. Coli during recent hospitalization. Symptoms persistent after completion of antibiotics.  - c diff pcr and toxin negative. Repeat GI panel is pending.  -GI consulted.  Repeat CT abd and pelvis ordered.    Obesity, Class III, BMI 40-49.9 (morbid obesity) (HCC) BMI of 40  PAF (paroxysmal atrial fibrillation) (HCC) On eliquis for anti coagulation and metoprolol for rate control.   Rheumatoid arthritis (HCC) -pt is on methotrexate and plaquenil. Will hold now with ongoing infection.   Hyperlipidemia -Continue statin  Essential hypertension Optimal.  Continue to monitor.   Bilateral leg edema:  On lasix , which will be restarted.  Get venous duplex of the lower extremities.   Hypokalemia  Replaced.  Check mag levels.      Estimated body mass index is 40.73 kg/m as calculated from the following:   Height as of this encounter: 5\' 3"  (1.6 m).   Weight as of this encounter: 104.3 kg.  Code Status: full code.  DVT Prophylaxis:   apixaban (ELIQUIS) tablet 5 mg   Level of Care: Level of care: Telemetry Medical Family Communication: family at bedside.   Disposition Plan:      Remains inpatient appropriate:  persistent diarrhea.   Procedures:  CT abd and pelvis.   Consultants:   Gastroenterology Dr Elnoria Howard.   Antimicrobials:   Anti-infectives (From admission, onward)    Start     Dose/Rate Route Frequency Ordered Stop   03/02/23 2000  azithromycin (ZITHROMAX) 500 mg in sodium chloride 0.9 % 250 mL IVPB        500 mg 250 mL/hr over 60 Minutes Intravenous Every 24 hours 03/02/23 1959          Medications  Scheduled Meds:  allopurinol  400 mg Oral QHS   apixaban  5 mg Oral BID   aspirin EC  81 mg Oral QHS   atorvastatin  80 mg Oral Daily   ezetimibe  10 mg Oral Daily   [START ON 03/04/2023] furosemide  40 mg Oral Daily   metoprolol tartrate  75 mg Oral BID   potassium chloride  40 mEq Oral BID   zolpidem  5 mg Oral QHS   Continuous Infusions:  azithromycin Stopped (03/02/23 2130)   lactated ringers Stopped (03/03/23 1130)   PRN Meds:.prochlorperazine    Subjective:  [name] was seen and examined today.  Persistent diarrhea, mild abd discomfort. Bilateral leg edema.   Objective:   Vitals:   03/02/23 2300 03/02/23 2312 03/02/23 2346 03/03/23 0455  BP: (!) 118/24 (!) 108/37 (!) 103/47 (!) 100/42  Pulse: 98 95 (!) 102 97  Resp: 19 19 18 19   Temp:   98.3 F (36.8 C) 98 F (36.7 C)  TempSrc:   Oral Oral  SpO2: 100% 99% 99% 97%  Weight:      Height:        Intake/Output Summary (Last 24 hours) at 03/03/2023 1352 Last data filed at 03/03/2023 1130 Gross per 24 hour  Intake 819.83 ml  Output --  Net 819.83 ml   Filed Weights   03/02/23 1115  Weight: 104.3 kg     Exam General exam: Appears calm and comfortable  Respiratory system: Clear to auscultation. Respiratory effort normal. Cardiovascular system: S1 & S2 heard, RRR.  Gastrointestinal system: Abdomen is nondistended, soft and nontender. Central nervous system: Alert and oriented. No focal neurological deficits. Extremities: bilateral lower extremity edema.  Skin:  No rashes,  Psychiatry: mood is appropriate.     Data Reviewed:  I have personally reviewed following labs and imaging studies   CBC Lab Results  Component Value Date   WBC 7.5 03/03/2023   RBC 3.13 (L) 03/03/2023   HGB 9.2 (L) 03/03/2023   HCT 29.1 (L) 03/03/2023   MCV 93.0 03/03/2023   MCH 29.4 03/03/2023   PLT 260 03/03/2023   MCHC 31.6 03/03/2023   RDW 18.2 (H) 03/03/2023   LYMPHSABS 1.9 03/02/2023   MONOABS 1.0 03/02/2023   EOSABS 0.3 03/02/2023   BASOSABS 0.1 03/02/2023     Last metabolic panel Lab Results  Component Value Date   NA 132 (L) 03/03/2023   K 3.2 (L) 03/03/2023   CL 105 03/03/2023   CO2 16 (L) 03/03/2023   BUN 30 (H) 03/03/2023   CREATININE 1.24 (H) 03/03/2023   GLUCOSE 111 (H) 03/03/2023   GFRNONAA 47 (L) 03/03/2023   GFRAA >60 12/11/2018   CALCIUM 7.7 (L) 03/03/2023   PROT 5.6 (L) 03/02/2023   ALBUMIN 2.6 (L) 03/02/2023   LABGLOB 1.9 08/13/2022   AGRATIO 2.3 (H) 08/13/2022   BILITOT 0.8 03/02/2023   ALKPHOS 65 03/02/2023   AST 10 (L) 03/02/2023   ALT 9 03/02/2023   ANIONGAP 11 03/03/2023    CBG (last 3)  No results for input(s): "GLUCAP" in the last 72 hours.    Coagulation Profile: No results for input(s): "INR", "PROTIME" in the last 168 hours.   Radiology Studies: No results found.     Kathlen Mody M.D. [facility] 03/03/2023, 1:52 PM  Available via Epic secure chat 7am-7pm After 7 pm, please refer to night coverage provider listed on amion.

## 2023-03-03 NOTE — Progress Notes (Signed)
Pt stating she does not think she can drink both contrast for CT scan.

## 2023-03-03 NOTE — Progress Notes (Signed)
Subjective: See below.  Objective: Vital signs in last 24 hours: Temp:  [98 F (36.7 C)-98.7 F (37.1 C)] 98 F (36.7 C) (08/22 0455) Pulse Rate:  [90-110] 97 (08/22 0455) Resp:  [17-19] 19 (08/22 0455) BP: (100-118)/(24-47) 100/42 (08/22 0455) SpO2:  [97 %-100 %] 97 % (08/22 0455) Last BM Date : 03/03/23  Intake/Output from previous day: 08/21 0701 - 08/22 0700 In: 579.8 [P.O.:100; I.V.:229.8; IV Piggyback:250] Out: -  Intake/Output this shift: Total I/O In: 480 [P.O.:480] Out: 0   General appearance: alert and no distress GI: soft, non-tender; bowel sounds normal; no masses,  no organomegaly  Lab Results: Recent Labs    03/02/23 1119 03/03/23 0814  WBC 10.7* 7.5  HGB 10.9* 9.2*  HCT 33.8* 29.1*  PLT 280 260   BMET Recent Labs    03/02/23 1119 03/03/23 0814  NA 132* 132*  K 3.9 3.2*  CL 102 105  CO2 18* 16*  GLUCOSE 113* 111*  BUN 36* 30*  CREATININE 1.83* 1.24*  CALCIUM 8.4* 7.7*   LFT Recent Labs    03/02/23 1119  PROT 5.6*  ALBUMIN 2.6*  AST 10*  ALT 9  ALKPHOS 65  BILITOT 0.8   PT/INR No results for input(s): "LABPROT", "INR" in the last 72 hours. Hepatitis Panel No results for input(s): "HEPBSAG", "HCVAB", "HEPAIGM", "HEPBIGM" in the last 72 hours. C-Diff Recent Labs    03/02/23 1610  CDIFFTOX NEGATIVE   Fecal Lactopherrin No results for input(s): "FECLLACTOFRN" in the last 72 hours.  Studies/Results: No results found.  Medications: Scheduled:  allopurinol  400 mg Oral QHS   aspirin EC  81 mg Oral QHS   atorvastatin  80 mg Oral Daily   ezetimibe  10 mg Oral Daily   [START ON 03/04/2023] furosemide  40 mg Oral Daily   iohexol  500 mL Oral Q1H   metoprolol tartrate  75 mg Oral BID   polyethylene glycol powder  1 Container Oral Once   potassium chloride  40 mEq Oral BID   sodium bicarbonate  650 mg Oral BID   zolpidem  5 mg Oral QHS   Continuous:  azithromycin Stopped (03/02/23 2130)    Assessment/Plan: 1) Chronic  diarrhea. 2) AKI.   The patient was diagnosed and treated for EPEC three weeks ago.  She completed a 5 days course of azithromycin, but it did not resolve her diarrhea.  She was hospitalized at the time of diagnosis and she appeared to be improving after three days of treatment in the hospital.  The patient was offered a colonoscopy in the hospital, but she declined.  Instead she opted to have an outpatient follow up, but she never followed up.  The patient reports that she was so weak and not able to eat anything over these past three weeks.  She does not know why she did not present back to the hospital much sooner.  At this time she is willing to undergo the colonoscopy.  Plan: 1) Colonoscopy with biopsies tomorrow. 2) Continue with IV hydration.  LOS: 1 day   Kruze Atchley D 03/03/2023, 3:33 PM

## 2023-03-04 ENCOUNTER — Inpatient Hospital Stay (HOSPITAL_COMMUNITY): Payer: Medicare Other | Admitting: Anesthesiology

## 2023-03-04 ENCOUNTER — Encounter (HOSPITAL_COMMUNITY): Payer: Self-pay | Admitting: Family Medicine

## 2023-03-04 ENCOUNTER — Encounter (HOSPITAL_COMMUNITY)
Admission: EM | Disposition: A | Discharge status: Home Health | Payer: Self-pay | Source: Home / Self Care | Attending: Internal Medicine

## 2023-03-04 ENCOUNTER — Inpatient Hospital Stay (HOSPITAL_COMMUNITY): Payer: Medicare Other

## 2023-03-04 DIAGNOSIS — I480 Paroxysmal atrial fibrillation: Secondary | ICD-10-CM

## 2023-03-04 DIAGNOSIS — I10 Essential (primary) hypertension: Secondary | ICD-10-CM | POA: Diagnosis not present

## 2023-03-04 DIAGNOSIS — I5032 Chronic diastolic (congestive) heart failure: Secondary | ICD-10-CM | POA: Diagnosis not present

## 2023-03-04 DIAGNOSIS — I110 Hypertensive heart disease with heart failure: Secondary | ICD-10-CM

## 2023-03-04 DIAGNOSIS — N179 Acute kidney failure, unspecified: Secondary | ICD-10-CM | POA: Diagnosis not present

## 2023-03-04 DIAGNOSIS — K5730 Diverticulosis of large intestine without perforation or abscess without bleeding: Secondary | ICD-10-CM | POA: Diagnosis not present

## 2023-03-04 DIAGNOSIS — R197 Diarrhea, unspecified: Secondary | ICD-10-CM | POA: Diagnosis not present

## 2023-03-04 DIAGNOSIS — E782 Mixed hyperlipidemia: Secondary | ICD-10-CM | POA: Diagnosis not present

## 2023-03-04 HISTORY — PX: BIOPSY: SHX5522

## 2023-03-04 HISTORY — PX: COLONOSCOPY WITH PROPOFOL: SHX5780

## 2023-03-04 LAB — BASIC METABOLIC PANEL
Anion gap: 9 (ref 5–15)
BUN: 25 mg/dL — ABNORMAL HIGH (ref 8–23)
CO2: 19 mmol/L — ABNORMAL LOW (ref 22–32)
Calcium: 7.9 mg/dL — ABNORMAL LOW (ref 8.9–10.3)
Chloride: 104 mmol/L (ref 98–111)
Creatinine, Ser: 1.21 mg/dL — ABNORMAL HIGH (ref 0.44–1.00)
GFR, Estimated: 48 mL/min — ABNORMAL LOW (ref >60)
Glucose, Bld: 89 mg/dL (ref 70–99)
Potassium: 3.1 mmol/L — ABNORMAL LOW (ref 3.5–5.1)
Sodium: 132 mmol/L — ABNORMAL LOW (ref 135–145)

## 2023-03-04 LAB — GASTROINTESTINAL PANEL BY PCR, STOOL (REPLACES STOOL CULTURE)

## 2023-03-04 LAB — CBC WITH DIFFERENTIAL/PLATELET
Abs Immature Granulocytes: 0.05 10*3/uL (ref 0.00–0.07)
Basophils Absolute: 0.1 10*3/uL (ref 0.0–0.1)
Basophils Relative: 1 %
Eosinophils Absolute: 0.5 10*3/uL (ref 0.0–0.5)
Eosinophils Relative: 7 %
HCT: 27.6 % — ABNORMAL LOW (ref 36.0–46.0)
Hemoglobin: 8.9 g/dL — ABNORMAL LOW (ref 12.0–15.0)
Immature Granulocytes: 1 %
Lymphocytes Relative: 21 %
Lymphs Abs: 1.5 10*3/uL (ref 0.7–4.0)
MCH: 29.3 pg (ref 26.0–34.0)
MCHC: 32.2 g/dL (ref 30.0–36.0)
MCV: 90.8 fL (ref 80.0–100.0)
Monocytes Absolute: 0.9 10*3/uL (ref 0.1–1.0)
Monocytes Relative: 12 %
Neutro Abs: 4.4 10*3/uL (ref 1.7–7.7)
Neutrophils Relative %: 58 %
Platelets: 270 10*3/uL (ref 150–400)
RBC: 3.04 MIL/uL — ABNORMAL LOW (ref 3.87–5.11)
RDW: 18 % — ABNORMAL HIGH (ref 11.5–15.5)
WBC: 7.5 10*3/uL (ref 4.0–10.5)
nRBC: 0 % (ref 0.0–0.2)

## 2023-03-04 SURGERY — COLONOSCOPY WITH PROPOFOL
Anesthesia: Monitor Anesthesia Care

## 2023-03-04 MED ORDER — MAGNESIUM SULFATE 2 GM/50ML IV SOLN
2.0000 g | Freq: Once | INTRAVENOUS | Status: AC
  Administered 2023-03-04: 2 g via INTRAVENOUS
  Filled 2023-03-04: qty 50

## 2023-03-04 MED ORDER — LACTATED RINGERS IV SOLN
INTRAVENOUS | Status: AC | PRN
  Administered 2023-03-04: 10 mL/h via INTRAVENOUS

## 2023-03-04 MED ORDER — PROPOFOL 10 MG/ML IV BOLUS
INTRAVENOUS | Status: DC | PRN
  Administered 2023-03-04: 50 mg via INTRAVENOUS

## 2023-03-04 MED ORDER — ALBUTEROL SULFATE (2.5 MG/3ML) 0.083% IN NEBU
2.5000 mg | INHALATION_SOLUTION | Freq: Once | RESPIRATORY_TRACT | Status: AC
  Administered 2023-03-04: 2.5 mg via RESPIRATORY_TRACT

## 2023-03-04 MED ORDER — POTASSIUM CHLORIDE CRYS ER 20 MEQ PO TBCR
40.0000 meq | EXTENDED_RELEASE_TABLET | Freq: Two times a day (BID) | ORAL | Status: AC
  Administered 2023-03-04 – 2023-03-05 (×2): 40 meq via ORAL
  Filled 2023-03-04 (×2): qty 2

## 2023-03-04 MED ORDER — ALBUTEROL SULFATE (2.5 MG/3ML) 0.083% IN NEBU
INHALATION_SOLUTION | RESPIRATORY_TRACT | Status: AC
  Filled 2023-03-04: qty 3

## 2023-03-04 MED ORDER — ENOXAPARIN SODIUM 60 MG/0.6ML IJ SOSY
50.0000 mg | PREFILLED_SYRINGE | INTRAMUSCULAR | Status: DC
  Administered 2023-03-04 – 2023-03-05 (×2): 50 mg via SUBCUTANEOUS
  Filled 2023-03-04 (×2): qty 0.6

## 2023-03-04 MED ORDER — METOPROLOL TARTRATE 12.5 MG HALF TABLET
12.5000 mg | ORAL_TABLET | Freq: Two times a day (BID) | ORAL | Status: DC
  Administered 2023-03-04: 12.5 mg via ORAL
  Filled 2023-03-04 (×2): qty 1

## 2023-03-04 MED ORDER — PHENYLEPHRINE 80 MCG/ML (10ML) SYRINGE FOR IV PUSH (FOR BLOOD PRESSURE SUPPORT)
PREFILLED_SYRINGE | INTRAVENOUS | Status: DC | PRN
  Administered 2023-03-04: 80 ug via INTRAVENOUS
  Administered 2023-03-04 (×4): 160 ug via INTRAVENOUS

## 2023-03-04 MED ORDER — LIDOCAINE 2% (20 MG/ML) 5 ML SYRINGE
INTRAMUSCULAR | Status: DC | PRN
  Administered 2023-03-04: 50 mg via INTRAVENOUS

## 2023-03-04 MED ORDER — ONDANSETRON HCL 4 MG/2ML IJ SOLN
4.0000 mg | Freq: Four times a day (QID) | INTRAMUSCULAR | Status: DC | PRN
  Administered 2023-03-05 – 2023-03-13 (×12): 4 mg via INTRAVENOUS
  Filled 2023-03-04 (×15): qty 2

## 2023-03-04 MED ORDER — PROPOFOL 500 MG/50ML IV EMUL
INTRAVENOUS | Status: DC | PRN
  Administered 2023-03-04: 100 ug/kg/min via INTRAVENOUS

## 2023-03-04 MED ORDER — EPHEDRINE SULFATE-NACL 50-0.9 MG/10ML-% IV SOSY
PREFILLED_SYRINGE | INTRAVENOUS | Status: DC | PRN
  Administered 2023-03-04 (×2): 10 mg via INTRAVENOUS

## 2023-03-04 SURGICAL SUPPLY — 22 items

## 2023-03-04 NOTE — Plan of Care (Signed)
  Problem: Education: Goal: Knowledge of General Education information will improve Description: Including pain rating scale, medication(s)/side effects and non-pharmacologic comfort measures Outcome: Progressing   Problem: Clinical Measurements: Goal: Ability to maintain clinical measurements within normal limits will improve Outcome: Progressing Goal: Will remain free from infection Outcome: Progressing   

## 2023-03-04 NOTE — Interval H&P Note (Signed)
History and Physical Interval Note:  03/04/2023 10:53 AM  Cindy Holmes  has presented today for surgery, with the diagnosis of Chronic diarrhea.  The various methods of treatment have been discussed with the patient and family. After consideration of risks, benefits and other options for treatment, the patient has consented to  Procedure(s): COLONOSCOPY WITH PROPOFOL (N/A) as a surgical intervention.  The patient's history has been reviewed, patient examined, no change in status, stable for surgery.  I have reviewed the patient's chart and labs.  Questions were answered to the patient's satisfaction.     Nimco Bivens D

## 2023-03-04 NOTE — Op Note (Addendum)
[facility] Patient Name: Cindy Holmes Procedure Date : 03/04/2023 MRN: [id number] Attending MD: Jeani Hawking , MD, [id number] Date of Birth: 1951/05/11 CSN: [id number] Age: 72 Admit Type: Inpatient Procedure:                Colonoscopy Indications:              Clinically significant diarrhea of unexplained                            origin Providers:                Jeani Hawking, MD, Geralyn Corwin, RN, Marja Kays, Technician Referring MD:              Medicines:                Propofol per Anesthesia Complications:            No immediate complications. Estimated Blood Loss:     Estimated blood loss was minimal. Procedure:                Pre-Anesthesia Assessment:                           - Prior to the procedure, a History and Physical                            was performed, and patient medications and                            allergies were reviewed. The patient's tolerance of                            previous anesthesia was also reviewed. The risks                            and benefits of the procedure and the sedation                            options and risks were discussed with the patient.                            All questions were answered, and informed consent                            was obtained. Prior Anticoagulants: The patient has                            taken Eliquis (apixaban), last dose was 1 day prior                            to procedure. ASA Grade Assessment: III - A patient  with severe systemic disease. After reviewing the                            risks and benefits, the patient was deemed in                            satisfactory condition to undergo the procedure.                           - Sedation was administered by an anesthesia                            professional. Deep sedation was attained.                           After obtaining informed consent,  the colonoscope                            was passed under direct vision. Throughout the                            procedure, the patient's blood pressure, pulse, and                            oxygen saturations were monitored continuously. The                            PCF-H190TL ([id number]) Olympus slim colonoscope was                            introduced through the anus and advanced to the the                            cecum, identified by appendiceal orifice and                            ileocecal valve. The colonoscopy was performed                            without difficulty. The patient tolerated the                            procedure well. The quality of the bowel                            preparation was evaluated using the BBPS [facility]                            Bowel Preparation Scale) with scores of: Right                            Colon = 2 (minor amount of residual staining, small  fragments of stool and/or opaque liquid, but mucosa                            seen well), Transverse Colon = 2 (minor amount of                            residual staining, small fragments of stool and/or                            opaque liquid, but mucosa seen well) and Left Colon                            = 2 (minor amount of residual staining, small                            fragments of stool and/or opaque liquid, but mucosa                            seen well). The total BBPS score equals 6. The                            quality of the bowel preparation was good. The                            ileocecal valve, appendiceal orifice, and rectum                            were photographed. Scope In: 11:05:33 AM Scope Out: 11:23:12 AM Scope Withdrawal Time: 0 hours 7 minutes 42 seconds  Total Procedure Duration: 0 hours 17 minutes 39 seconds  Findings:      A benign-appearing, intrinsic moderate stenosis measuring 5 cm (in       length) was found in  the sigmoid colon and was traversed.      Medium-mouthed diverticula were found in the sigmoid colon.      Normal mucosa was found in the entire colon. Biopsies were taken with a       cold forceps for histology.      The adult colonoscope was switched for the ultraslim colonoscope as a       sigmoid colon stenosis was encountered. The findings were consistent       with the CT scan findings of a 5 cm long stenosis. The ultraslim       colonoscope was easily passed through the area. The mucosa was normal       and no friability or other irregularities noted to suggest a malignant       issue. It appears that the patient was suffering from an overflow       incontinence, however, random mucosal biopsies were obtained to evaluate       for any evidence of microscopic colitis. Barotrauma was noted in the       cecum and distal ascending colon. More bleeding was noted as she was       only one day off of Eliquis. Care was taken to decompress the colon and       not to reinfuse any more  CO2 during the withdrawal of the colonoscope. Impression:               - Stricture in the sigmoid colon.                           - Diverticulosis in the sigmoid colon.                           - Normal mucosa in the entire examined colon.                            Biopsied. Recommendation:           - Return patient to hospital ward for ongoing care.                           - Clear liquid diet.                           - Continue present medications.                           - Await pathology results.                           - Surgical consultation for diverticular stenosis. Procedure Code(s):        --- Professional ---                           [phone], Colonoscopy, flexible; with biopsy, single                            or multiple Diagnosis Code(s):        --- Professional ---                           [id number], Other intestinal obstruction unspecified                            as to partial  versus complete obstruction                           R19.7, Diarrhea, unspecified                           K57.30, Diverticulosis of large intestine without                            perforation or abscess without bleeding CPT copyright 2022 [employer]. All rights reserved. The codes documented in this report are preliminary and upon coder review may  be revised to meet current compliance requirements. Jeani Hawking, MD Jeani Hawking, MD 03/04/2023 11:54:22 AM This report has been signed electronically. Number of Addenda: 0

## 2023-03-04 NOTE — Consult Note (Signed)
Reason for Consult: Abdominal pain Referring Physician: Dr. Trudie Buckler is an 72 y.o. female.  HPI: Patient is a 72 year old female, with history of CAD, A-fib-on Eliquis, hypertension, hyperlipidemia.  Patient comes in sick [place] to abdominal pain.  She states that she was recently in the hospital approximate 3 weeks ago with similar symptoms.  Says she had diarrhea for approximately 2 months.  Patient underwent CT scan was found to have a sigmoid narrowing.  Patient underwent colonoscopy today by Dr. Elnoria Howard.  There did not appear to be a sigmoid stricture likely secondary to diverticulitis.  Biopsies were taken.  Currently pending.  Patient states that she last took her Eliquis on Thursday.  Past Medical History:  Diagnosis Date   CAD (coronary artery disease)    a. PTCA LAD 2001/cath 2002-prox and mid LAD stents patent, PTCA ostium Dx 1 b. cath 03/2016: patent LAD stent with less than 40% in-stent restenosis, 10-30% stenosis of D1 and distal LAD with continued medical management recommended. 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    Past Surgical History:  Procedure Laterality Date   CARDIAC CATHETERIZATION     with stent placement in 2001   CARDIAC CATHETERIZATION N/A 03/12/2016   Procedure: Left Heart Cath and Coronary  Angiography;  Surgeon: Yvonne Kendall, MD;  Location: [facility] INVASIVE CV LAB;  Service: Cardiovascular;  Laterality: N/A;   CARDIAC CATHETERIZATION N/A 03/12/2016   Procedure: Intravascular Pressure Wire/FFR Study;  Surgeon: Yvonne Kendall, MD;  Location: [facility] INVASIVE CV LAB;  Service: Cardiovascular;  Laterality: N/A;   CHOLECYSTECTOMY     CORONARY ANGIOPLASTY     2002   ESOPHAGOGASTRODUODENOSCOPY N/A 12/03/2016   Procedure: ESOPHAGOGASTRODUODENOSCOPY (EGD);  Surgeon: Jeani Hawking, MD;  Location: [facility] ENDOSCOPY;  Service: Endoscopy;  Laterality: N/A;   laproscopic adjustable gastric  banding     with APS standard system.    OPEN REDUCTION INTERNAL FIXATION (ORIF) DISTAL RADIAL FRACTURE Right 05/15/2018   Procedure: OPEN REDUCTION INTERNAL FIXATION (ORIF) DISTAL RADIAL FRACTURE;  Surgeon: Roby Lofts, MD;  Location: [facility] OR;  Service: Orthopedics;  Laterality: Right;   ORIF ANKLE FRACTURE Right 05/15/2018   Procedure: OPEN REDUCTION INTERNAL FIXATION (ORIF) ANKLE FRACTURE;  Surgeon: Roby Lofts, MD;  Location: [facility] OR;  Service: Orthopedics;  Laterality: Right;   ORIF TIBIA PLATEAU Right 05/15/2018   Procedure: OPEN REDUCTION INTERNAL FIXATION (ORIF) TIBIAL PLATEAU;  Surgeon: Roby Lofts, MD;  Location: [facility] OR;  Service: Orthopedics;  Laterality: Right;   Pt has 3 stents     sept 2001   SHOULDER ARTHROSCOPY WITH SUBACROMIAL DECOMPRESSION, ROTATOR CUFF REPAIR AND BICEP TENDON REPAIR Right 11/16/2013   Procedure: RIGHT SHOULDER ARTHROSCOPY, EXTENSIVE DEBRIDEMENT, ROTATOR CUFF REPAIR ;  Surgeon: Eulas Post, MD;  Location: [facility];  Service: Orthopedics;  Laterality: Right;   TUBAL LIGATION      Family History  Problem Relation Age of Onset   Heart attack Mother    Heart disease Mother    Arthritis/Rheumatoid Father    Cirrhosis Father        hepatic cirrhosis   Breast cancer Sister 69   Diabetes Sister    Heart disease Maternal Aunt    Heart attack Maternal Aunt     Throat cancer Maternal Aunt    Heart attack Maternal Uncle    Colon cancer Maternal Grandmother    Throat cancer Maternal Grandfather    Diabetes Brother    Heart disease Brother    Heart disease Brother    Cancer Other        family history   Heart attack Other        family history    Social History:  reports that she has never smoked. She has never used smokeless tobacco. She reports that she does not drink alcohol and does not use drugs.  Allergies:  Allergies  Allergen Reactions   Codeine Phosphate Shortness Of Breath   Morphine And Codeine Nausea And Vomiting   Amoxicillin Nausea And Vomiting   Penicillins Nausea And Vomiting    Has patient had a PCN reaction causing immediate rash, facial/tongue/throat swelling, SOB or lightheadedness with hypotension:YES Has patient had a PCN reaction causing severe rash involving mucus membranes or skin necrosis: NO Has patient had a PCN reaction that required hospitalization NO Has patient had a PCN reaction occurring within the last 10 years: NO If all of the above answers are "NO", then may proceed with Cephalosporin use.   Methotrexate Nausea Only   Lidocaine Rash    Reaction to lidocaine patch    Medications: I have reviewed the patient's current medications.  Results for orders placed or performed during the hospital encounter of 03/02/23 (from the past 48 hour(s))  Gastrointestinal Panel by PCR , Stool     Status: None   Collection Time: 03/02/23  4:10 PM   Specimen: Stool  Result Value Ref Range   Campylobacter species NOT DETECTED NOT DETECTED   Plesimonas shigelloides NOT DETECTED NOT DETECTED   Salmonella species NOT DETECTED NOT DETECTED   Yersinia enterocolitica NOT DETECTED NOT DETECTED   Vibrio species NOT DETECTED NOT DETECTED   Vibrio cholerae NOT DETECTED NOT DETECTED   Enteroaggregative E coli (EAEC) NOT DETECTED NOT DETECTED   Enteropathogenic E coli (EPEC) NOT DETECTED NOT DETECTED   Enterotoxigenic E coli  (ETEC) NOT DETECTED NOT DETECTED   Shiga like toxin producing E coli (STEC) NOT DETECTED NOT DETECTED   Shigella/Enteroinvasive E coli (EIEC) NOT DETECTED NOT DETECTED   Cryptosporidium NOT DETECTED NOT DETECTED   Cyclospora cayetanensis NOT DETECTED NOT DETECTED   Entamoeba histolytica NOT DETECTED NOT DETECTED   Giardia lamblia NOT DETECTED NOT DETECTED   Adenovirus F40/41 NOT DETECTED NOT DETECTED   Astrovirus NOT DETECTED NOT DETECTED   Norovirus GI/GII NOT DETECTED NOT DETECTED   Rotavirus A NOT DETECTED NOT DETECTED   Sapovirus (I, II, IV, and V) NOT DETECTED NOT DETECTED    Comment: Performed at [facility], [address] [place], [place] [address]  C Difficile Quick Screen w PCR reflex     Status: None   Collection Time: 03/02/23  4:10 PM   Specimen: Stool  Result Value Ref Range   C  Diff antigen NEGATIVE NEGATIVE   C Diff toxin NEGATIVE NEGATIVE   C Diff interpretation No C. difficile detected.     Comment: Performed at [facility], [address], [place], [place] [address]  Urinalysis, Routine w reflex microscopic -Urine, Clean Catch     Status: Abnormal   Collection Time: 03/02/23  6:00 PM  Result Value Ref Range   Color, Urine YELLOW YELLOW   APPearance HAZY (A) CLEAR   Specific Gravity, Urine 1.020 1.005 - 1.030   pH 5.0 5.0 - 8.0   Glucose, UA NEGATIVE NEGATIVE mg/dL   Hgb urine dipstick NEGATIVE NEGATIVE   Bilirubin Urine NEGATIVE NEGATIVE   Ketones, ur NEGATIVE NEGATIVE mg/dL   Protein, ur NEGATIVE NEGATIVE mg/dL   Nitrite NEGATIVE NEGATIVE   Leukocytes,Ua MODERATE (A) NEGATIVE   RBC / HPF 0-5 0 - 5 RBC/hpf   WBC, UA 21-50 0 - 5 WBC/hpf   Bacteria, UA RARE (A) NONE SEEN   Squamous Epithelial / HPF 6-10 0 - 5 /HPF    Comment: Performed at [facility], [address] [place], [place] [address]  CBC     Status: Abnormal   Collection Time: 03/03/23  8:14 AM  Result Value Ref Range   WBC 7.5 4.0 - 10.5 K/uL   RBC 3.13 (L) 3.87 - 5.11  MIL/uL   Hemoglobin 9.2 (L) 12.0 - 15.0 g/dL   HCT 19.1 (L) 47.8 - 29.5 %   MCV 93.0 80.0 - 100.0 fL   MCH 29.4 26.0 - 34.0 pg   MCHC 31.6 30.0 - 36.0 g/dL   RDW 62.1 (H) 30.8 - 65.7 %   Platelets 260 150 - 400 K/uL   nRBC 0.0 0.0 - 0.2 %    Comment: Performed at [facility], [address], [place], [place] [address]  Basic metabolic panel     Status: Abnormal   Collection Time: 03/03/23  8:14 AM  Result Value Ref Range   Sodium 132 (L) 135 - 145 mmol/L   Potassium 3.2 (L) 3.5 - 5.1 mmol/L   Chloride 105 98 - 111 mmol/L   CO2 16 (L) 22 - 32 mmol/L   Glucose, Bld 111 (H) 70 - 99 mg/dL    Comment: Glucose reference range applies only to samples taken after fasting for at least 8 hours.   BUN 30 (H) 8 - 23 mg/dL   Creatinine, Ser 2.95 (H) 0.44 - 1.00 mg/dL   Calcium 7.7 (L) 8.9 - 10.3 mg/dL   GFR, Estimated 47 (L) >60 mL/min    Comment: (NOTE) Calculated using the CKD-EPI Creatinine Equation (2021)    Anion gap 11 5 - 15    Comment: Performed at [facility], [address], [place], [place] [address]  Magnesium     Status: Abnormal   Collection Time: 03/03/23  8:14 AM  Result Value Ref Range   Magnesium 1.5 (L) 1.7 - 2.4 mg/dL    Comment: Performed at [facility], [address], [place], [place] [address]  CBC with Differential/Platelet     Status: Abnormal   Collection Time: 03/04/23  4:49 AM  Result Value Ref Range   WBC 7.5 4.0 - 10.5 K/uL   RBC 3.04 (L) 3.87 - 5.11 MIL/uL   Hemoglobin 8.9 (L) 12.0 - 15.0 g/dL   HCT 02.7 (L) 25.3 - 66.4 %   MCV 90.8 80.0 - 100.0 fL   MCH 29.3 26.0 - 34.0 pg  MCHC 32.2 30.0 - 36.0 g/dL   RDW 02.7 (H) 25.3 - 66.4 %   Platelets 270 150 - 400 K/uL   nRBC 0.0 0.0 - 0.2 %   Neutrophils Relative % 58 %   Neutro Abs 4.4 1.7 - 7.7 K/uL   Lymphocytes Relative 21 %   Lymphs Abs 1.5 0.7 - 4.0 K/uL   Monocytes Relative 12 %   Monocytes Absolute 0.9 0.1 - 1.0 K/uL   Eosinophils Relative 7 %   Eosinophils Absolute 0.5 0.0  - 0.5 K/uL   Basophils Relative 1 %   Basophils Absolute 0.1 0.0 - 0.1 K/uL   Immature Granulocytes 1 %   Abs Immature Granulocytes 0.05 0.00 - 0.07 K/uL    Comment: Performed at [facility], [address], [place], [place] [address]  Basic metabolic panel     Status: Abnormal   Collection Time: 03/04/23  4:49 AM  Result Value Ref Range   Sodium 132 (L) 135 - 145 mmol/L   Potassium 3.1 (L) 3.5 - 5.1 mmol/L   Chloride 104 98 - 111 mmol/L   CO2 19 (L) 22 - 32 mmol/L   Glucose, Bld 89 70 - 99 mg/dL    Comment: Glucose reference range applies only to samples taken after fasting for at least 8 hours.   BUN 25 (H) 8 - 23 mg/dL   Creatinine, Ser 4.25 (H) 0.44 - 1.00 mg/dL   Calcium 7.9 (L) 8.9 - 10.3 mg/dL   GFR, Estimated 48 (L) >60 mL/min    Comment: (NOTE) Calculated using the CKD-EPI Creatinine Equation (2021)    Anion gap 9 5 - 15    Comment: Performed at [facility], [address] [place], [place] [address]    CT ABDOMEN PELVIS WO CONTRAST  Result Date: 03/03/2023 CLINICAL DATA:  Abdominal pain EXAM: CT ABDOMEN AND PELVIS WITHOUT CONTRAST TECHNIQUE: Multidetector CT imaging of the abdomen and pelvis was performed following the standard protocol without IV contrast. RADIATION DOSE REDUCTION: This exam was performed according to the departmental dose-optimization program which includes automated exposure control, adjustment of the mA and/or kV according to patient size and/or use of iterative reconstruction technique. COMPARISON:  CT abdomen and pelvis 02/02/2023 FINDINGS: Lower chest: No acute abnormality. Hepatobiliary: No focal liver abnormality is seen. Status post cholecystectomy. No biliary dilatation. Pancreas: Unremarkable. No pancreatic ductal dilatation or surrounding inflammatory changes. Spleen: Normal in size without focal abnormality. Adrenals/Urinary Tract: There is a rounded hypodensity in the superior pole the right kidney measuring less than 1 cm,  unchanged. There is no hydronephrosis or urinary tract calculus. The adrenal glands and bladder are within normal limits. Stomach/Bowel: Stomach is within normal limits. Gastric band in place. Appendix appears normal. No acute inflammation, pneumatosis or free air. There some subtle questionable wall thickening of the mid sigmoid colon measuring 5.5 cm in length on image 6/63. Vascular/Lymphatic: Aortic atherosclerosis. No enlarged abdominal or pelvic lymph nodes. Reproductive: Uterus and bilateral adnexa are unremarkable. Other: No abdominal wall hernia or abnormality. There is mild body wall edema. No abdominopelvic ascites. Musculoskeletal: Compression deformities of L2, L3 and L4 appear stable. IMPRESSION: 1. Questionable wall thickening of the mid sigmoid colon. Correlate clinically for colitis. Neoplasm can not be excluded. Recommend further evaluation with colonoscopy. 2. Mild body wall edema. 3. Stable hypodense lesion in the right kidney, indeterminate. This can be further evaluated with ultrasound or MRI. 4. Gastric band in place. No bowel obstruction. 5. Aortic atherosclerosis. Aortic Atherosclerosis (  ICD10-I70.0). Electronically Signed   By: Darliss Cheney M.D.   On: 03/03/2023 23:08   VAS [place] LOWER EXTREMITY VENOUS (DVT)  Result Date: 03/03/2023  Lower Venous DVT Study Patient Name:  SASHAY PINERO [facility]  Date of Exam:   03/03/2023 Medical Rec #: [id number]        Accession #:    [id number] Date of Birth: 1950-10-29       Patient Gender: F Patient Age:   64 years Exam Location:  [facility] Procedure:      VAS [place] LOWER EXTREMITY VENOUS (DVT) Referring Phys: Kathlen Mody --------------------------------------------------------------------------------  Indications: Swelling.  Comparison Study: No previous study. Performing Technologist: McKayla Maag RVT, [place]  Examination Guidelines: A complete evaluation includes B-mode imaging, spectral Doppler, color Doppler, and power Doppler as needed of all  accessible portions of each vessel. Bilateral testing is considered an integral part of a complete examination. Limited examinations for reoccurring indications may be performed as noted. The reflux portion of the exam is performed with the patient in reverse Trendelenburg.  +--------+---------------+---------+-----------+----------+--------------------+ RIGHT   CompressibilityPhasicitySpontaneityPropertiesThrombus Aging       +--------+---------------+---------+-----------+----------+--------------------+ CFV     Full           Yes      Yes                                       +--------+---------------+---------+-----------+----------+--------------------+ SFJ     Full                                                              +--------+---------------+---------+-----------+----------+--------------------+ FV Prox Full                                                              +--------+---------------+---------+-----------+----------+--------------------+ FV Mid  Full                                                              +--------+---------------+---------+-----------+----------+--------------------+ FV                     Yes      Yes                  patent by color      Distal                                               doppler              +--------+---------------+---------+-----------+----------+--------------------+ PFV     Full                                                              +--------+---------------+---------+-----------+----------+--------------------+  POP                    Yes      Yes                  patent by color                                                           doppler              +--------+---------------+---------+-----------+----------+--------------------+ PTV                                                  Not visualized        +--------+---------------+---------+-----------+----------+--------------------+ PERO                                                 Not visualized       +--------+---------------+---------+-----------+----------+--------------------+   +--------+---------------+---------+-----------+----------+--------------------+ LEFT    CompressibilityPhasicitySpontaneityPropertiesThrombus Aging       +--------+---------------+---------+-----------+----------+--------------------+ CFV     Full           Yes      Yes                                       +--------+---------------+---------+-----------+----------+--------------------+ SFJ     Full                                                              +--------+---------------+---------+-----------+----------+--------------------+ FV Prox Full                                                              +--------+---------------+---------+-----------+----------+--------------------+ FV Mid  Full                                                              +--------+---------------+---------+-----------+----------+--------------------+ FV                     Yes      Yes                  patent by color      Distal  doppler              +--------+---------------+---------+-----------+----------+--------------------+ PFV     Full                                                              +--------+---------------+---------+-----------+----------+--------------------+ POP     Full           Yes      Yes                                       +--------+---------------+---------+-----------+----------+--------------------+ PTV                                                  Not visualized       +--------+---------------+---------+-----------+----------+--------------------+ PERO                                                 Not visualized        +--------+---------------+---------+-----------+----------+--------------------+     Summary: RIGHT: - There is no evidence of deep vein thrombosis in the lower extremity. However, portions of this examination were limited- see technologist comments above.  - No cystic structure found in the popliteal fossa.  LEFT: - There is no evidence of deep vein thrombosis in the lower extremity. However, portions of this examination were limited- see technologist comments above.  - No cystic structure found in the popliteal fossa.  *See table(s) above for measurements and observations. Electronically signed by Coral Else MD on 03/03/2023 at 9:40:06 PM.    Final     Review of Systems  Constitutional:  Negative for chills and fever.  HENT:  Negative for ear discharge, hearing loss and sore throat.   Eyes:  Negative for discharge.  Respiratory:  Negative for cough and shortness of breath.   Cardiovascular:  Negative for chest pain and leg swelling.  Gastrointestinal:  Positive for abdominal pain and diarrhea. Negative for constipation, nausea and vomiting.  Musculoskeletal:  Negative for myalgias and neck pain.  Skin:  Negative for rash.  Allergic/Immunologic: Negative for environmental allergies.  Neurological:  Negative for dizziness and seizures.  Hematological:  Does not bruise/bleed easily.  Psychiatric/Behavioral:  Negative for suicidal ideas.   All other systems reviewed and are negative.  Blood pressure (!) 107/57, pulse 73, temperature 97.9 F (36.6 C), temperature source Temporal, resp. rate 19, height 5\' 3"  (1.6 m), weight 104.3 kg, SpO2 95%. Physical Exam Constitutional:      Appearance: She is well-developed.     Comments: Conversant No acute distress  HENT:     Head: Normocephalic and atraumatic.  Eyes:     General: Lids are normal. No scleral icterus.    Pupils: Pupils are equal, round, and reactive to light.     Comments: Pupils are equal round and reactive No lid lag Moist  conjunctiva  Neck:     Thyroid: No thyromegaly.     Trachea: No  tracheal tenderness.     Comments: No cervical lymphadenopathy Cardiovascular:     Rate and Rhythm: Normal rate and regular rhythm.     Heart sounds: No murmur heard. Pulmonary:     Effort: Pulmonary effort is normal.     Breath sounds: Normal breath sounds. No wheezing or rales.  Abdominal:     General: There is distension.     Tenderness: There is no abdominal tenderness.     Hernia: No hernia is present.  Musculoskeletal:     Cervical back: Normal range of motion and neck supple.  Skin:    General: Skin is warm.     Findings: No rash.     Nails: There is no clubbing.     Comments: Normal skin turgor  Neurological:     Mental Status: She is alert and oriented to person, place, and time.     Comments: Normal gait and station  Psychiatric:        Mood and Affect: Mood normal.        Thought Content: Thought content normal.        Judgment: Judgment normal.     Comments: Appropriate affect     Assessment/Plan: 72 year old female with show sigmoid stricture likely due to diverticulitis CAD A-fib-Eliquis Hypertension  1.  Patient likely require ex lap and resection with colostomy since she has been in the hospital twice with this in the last month. I discussed with her and her family the likelihood of proceeding with surgery early next week by Dr. Luisa Hart. Recommend continue holding Eliquis.  Okay for heparin. Would recommend cardiac evaluation prior to surgery. 2.  Will follow along.  Axel Filler 03/04/2023, 3:40 PM

## 2023-03-04 NOTE — Evaluation (Signed)
Physical Therapy Evaluation Patient Details Name: Cindy Holmes MRN: [id number] DOB: [birth year] Today's Date: 03/04/2023  History of Present Illness  Pt is 72 yo presenting with persistent diarrhea, nausea and vomiting. PMH: HTN, RA, CAD chronic HFpEF and hx CVA. Pt was recently discharged from hospital to home for similar issue.  Clinical Impression  Pt is presenting close to baseline level of functioning. Pt spouse assists pt with all aspects of care. Pt is self limiting with functional mobility. Pt declines skilled physical therapy services for home. Will continue to follow at this time in acute care hospital setting which pt was agreeable to at this time due to pt is a high risk for falls due to poor body mechanics, impaired safety awareness including poor placement of assistive device with transfers and rushing with activities. Acute care skilled physical therapy services will address improved safety with functional mobility in order to decrease risk for falls at home.        If plan is discharge home, recommend the following: A little help with walking and/or transfers;Assistance with cooking/housework;Assist for transportation;Help with stairs or ramp for entrance     Equipment Recommendations None recommended by PT     Functional Status Assessment Patient has not had a recent decline in their functional status     Precautions / Restrictions Precautions Precautions: Fall Restrictions Weight Bearing Restrictions: No      Mobility  Bed Mobility Overal bed mobility: Needs Assistance Bed Mobility: Supine to Sit, Sit to Supine     Supine to sit: Contact guard Sit to supine: Contact guard assist   General bed mobility comments: Stability    Transfers Overall transfer level: Needs assistance Equipment used: Rolling walker (2 wheels) Transfers: Sit to/from Stand, Bed to chair/wheelchair/BSC Sit to Stand: Contact guard assist   Step pivot transfers: Contact guard assist        General transfer comment: pt pivots from bed to [facility], pt declines further mobility    Ambulation/Gait       Gait Pattern/deviations: Decreased step length - right, Decreased step length - left       General Gait Details: Steps from EOB to BSC, with short steps and intermittent forearms resting on RW      Balance Overall balance assessment: Mild deficits observed, not formally tested Sitting-balance support: No upper extremity supported, Feet supported Sitting balance-Leahy Scale: Good     Standing balance support: Reliant on assistive device for balance, Bilateral upper extremity supported, Single extremity supported Standing balance-Leahy Scale: Poor             Home Living Family/patient expects to be discharged to:: Private residence Living Arrangements: Spouse/significant other Available Help at Discharge: Family Type of Home: House Home Access: Ramped entrance       Home Layout: One level Home Equipment: Wheelchair - Forensic psychologist (2 wheels);Cane - single point;Tub bench Additional Comments: Pt uses scooter    Prior Function Prior Level of Function : Needs assist             Mobility Comments: ambulates for very short distances with RW, often utilizing wheelchair. History of many falls ADLs Comments: assistance with tub transfers and IADLs     Extremity/Trunk Assessment   Upper Extremity Assessment Upper Extremity Assessment: Generalized weakness    Lower Extremity Assessment Lower Extremity Assessment: Generalized weakness    Cervical / Trunk Assessment Cervical / Trunk Assessment: Kyphotic  Communication   Communication Communication: No apparent difficulties Cueing Techniques: Verbal cues;Tactile cues;Visual cues  Cognition Arousal: Alert Behavior During Therapy: WFL for tasks assessed/performed Overall Cognitive Status: Within Functional Limits for tasks assessed         General Comments: Pt has some difficulty with  following commands and communication due to hearing impairment.        General Comments General comments (skin integrity, edema, etc.): Pt spouse assists with all aspects of care. Pt demonstrates some possible learned helplessness. Pt has pain in the knee which contributes to impaired mobility. Assisted with pericare on pt request. pt states she normally does this at home but cannot do it in hospita        Assessment/Plan    PT Assessment Patient needs continued PT services  PT Problem List Decreased strength;Decreased activity tolerance;Decreased balance;Decreased mobility       PT Treatment Interventions DME instruction;Therapeutic exercise;Gait training;Balance training;Stair training;Functional mobility training;Therapeutic activities;Patient/family education;Wheelchair mobility training    PT Goals (Current goals can be found in the Care Plan section)  Acute Rehab PT Goals Patient Stated Goal: Go home with spouse PT Goal Formulation: With patient Time For Goal Achievement: 03/18/23 Potential to Achieve Goals: Fair    Frequency Min 1X/week        AM-PAC PT "6 Clicks" Mobility  Outcome Measure Help needed turning from your back to your side while in a flat bed without using bedrails?: A Little Help needed moving from lying on your back to sitting on the side of a flat bed without using bedrails?: A Little Help needed moving to and from a bed to a chair (including a wheelchair)?: A Little Help needed standing up from a chair using your arms (e.g., wheelchair or bedside chair)?: A Little Help needed to walk in hospital room?: A Little Help needed climbing 3-5 steps with a railing? : A Lot 6 Click Score: 17    End of Session   Activity Tolerance: Patient tolerated treatment well;Other (comment) (Pt is self limiting) Patient left: in bed;with call bell/phone within reach Nurse Communication: Mobility status PT Visit Diagnosis: History of falling (Z91.81);Muscle weakness  (generalized) (M62.81)    Time: 1610-9604 PT Time Calculation (min) (ACUTE ONLY): 38 min   Charges:   PT Evaluation $PT Eval Low Complexity: 1 Low PT Treatments $Therapeutic Activity: 23-37 mins PT General Charges $$ ACUTE PT VISIT: 1 Visit         Harrel Carina, DPT, CLT  Acute Rehabilitation Services Office: [phone] (Secure chat preferred)   Claudia Desanctis 03/04/2023, 4:20 PM

## 2023-03-04 NOTE — Anesthesia Preprocedure Evaluation (Addendum)
Anesthesia Evaluation  Patient identified by MRN, date of birth, ID band Patient awake    Reviewed: Allergy & Precautions, NPO status , Patient's Chart, lab work & pertinent test results  History of Anesthesia Complications (+) PONV and history of anesthetic complications  Airway Mallampati: III  TM Distance: >3 FB Neck ROM: Full   Comment: Previous grade II view with MAC 3, easy mask Dental  (+) Partial Upper, Dental Advisory Given   Pulmonary neg pulmonary ROS   Pulmonary exam normal breath sounds clear to auscultation       Cardiovascular hypertension, Pt. on home beta blockers and Pt. on medications (-) angina + CAD, + Cardiac Stents (6 stents, last one in 2011) and +CHF  (-) CABG + dysrhythmias Atrial Fibrillation  Rhythm:Regular Rate:Normal  Carotid artery disease, HLD  Normal stress test 03/30/2021  TTE 10/27/2020: IMPRESSIONS     1. Left ventricular ejection fraction, by estimation, is 60 to 65%. The  left ventricle has normal function. The left ventricle has no regional  wall motion abnormalities. There is mild concentric left ventricular  hypertrophy. Left ventricular diastolic  parameters are consistent with Grade II diastolic dysfunction  (pseudonormalization).   2. Right ventricular systolic function is normal. The right ventricular  size is normal. There is normal pulmonary artery systolic pressure. The  estimated right ventricular systolic pressure is 31.1 mmHg.   3. Left atrial size was mildly dilated.   4. The mitral valve is grossly normal. Mild mitral valve regurgitation.  No evidence of mitral stenosis.   5. The aortic valve is tricuspid. Aortic valve regurgitation is trivial.  No aortic stenosis is present.   6. The inferior vena cava is dilated in size with <50% respiratory  variability, suggesting right atrial pressure of 15 mmHg.     Neuro/Psych neg Seizures PSYCHIATRIC DISORDERS      TIA    GI/Hepatic Neg liver ROS,neg GERD  ,,Chronic diarrhea   Endo/Other  neg diabetes  Morbid obesity  Renal/GU Renal disease     Musculoskeletal  (+) Arthritis , Rheumatoid disorders,  Osteoporosis    Abdominal  (+) + obese  Peds  Hematology  (+) Blood dyscrasia, anemia Lab Results      Component                Value               Date                      WBC                      7.5                 03/04/2023                HGB                      8.9 (L)             03/04/2023                HCT                      27.6 (L)            03/04/2023                MCV  90.8                03/04/2023                PLT                      270                 03/04/2023              Anesthesia Other Findings Last Eliquis: yesterday  Reproductive/Obstetrics                              Anesthesia Physical Anesthesia Plan  ASA: 3  Anesthesia Plan: MAC   Post-op Pain Management:    Induction: Intravenous  PONV Risk Score and Plan: 3 and Propofol infusion, TIVA and Treatment may vary due to age or medical condition  Airway Management Planned: Natural Airway and Nasal Cannula  Additional Equipment:   Intra-op Plan:   Post-operative Plan:   Informed Consent: I have reviewed the patients History and Physical, chart, labs and discussed the procedure including the risks, benefits and alternatives for the proposed anesthesia with the patient or authorized representative who has indicated his/her understanding and acceptance.     Dental advisory given  Plan Discussed with: CRNA and Anesthesiologist  Anesthesia Plan Comments: (Discussed with patient risks of MAC including, but not limited to, minor pain or discomfort, hearing people in the room, and possible need for backup general anesthesia. Risks for general anesthesia also discussed including, but not limited to, sore throat, hoarse voice, chipped/damaged teeth, injury to  vocal cords, nausea and vomiting, allergic reactions, lung infection, heart attack, stroke, and death. All questions answered. )         Anesthesia Quick Evaluation

## 2023-03-04 NOTE — Anesthesia Postprocedure Evaluation (Signed)
Anesthesia Post Note  Patient: Cindy Holmes  Procedure(s) Performed: COLONOSCOPY WITH PROPOFOL BIOPSY     Patient location during evaluation: PACU Anesthesia Type: MAC Level of consciousness: awake Pain management: pain level controlled Vital Signs Assessment: post-procedure vital signs reviewed and stable Respiratory status: spontaneous breathing, nonlabored ventilation and respiratory function stable Cardiovascular status: stable and blood pressure returned to baseline Postop Assessment: no apparent nausea or vomiting Anesthetic complications: no   No notable events documented.  Last Vitals:  Vitals:   03/04/23 1201 03/04/23 1217  BP: (!) 99/50 (!) 107/57  Pulse: 74 73  Resp: 17 19  Temp:    SpO2: 100% 95%    Last Pain:  Vitals:   03/04/23 1217  TempSrc:   PainSc: 0-No pain                 Linton Rump

## 2023-03-04 NOTE — Transfer of Care (Signed)
Immediate Anesthesia Transfer of Care Note  Patient: Cindy Holmes  Procedure(s) Performed: COLONOSCOPY WITH PROPOFOL BIOPSY  Patient Location: PACU  Anesthesia Type:MAC  Level of Consciousness: awake, alert , and oriented  Airway & Oxygen Therapy: Patient Spontanous Breathing and Patient connected to nasal cannula oxygen  Post-op Assessment: Report given to RN and Post -op Vital signs reviewed and stable  Post vital signs: Reviewed and stable  Last Vitals:  Vitals Value Taken Time  BP    Temp    Pulse 80 03/04/23 1134  Resp 25 03/04/23 1134  SpO2 94 % 03/04/23 1134  Vitals shown include unfiled device data.  Last Pain:  Vitals:   03/04/23 1019  TempSrc: Temporal  PainSc: 0-No pain      Patients Stated Pain Goal: 0 (03/03/23 2020)  Complications: No notable events documented.

## 2023-03-04 NOTE — Care Management Important Message (Signed)
Important Message  Patient Details  Name: Cindy Holmes MRN: [id number] Date of Birth: September 18, 1950   Medicare Important Message Given:  Yes     Sten Dematteo 03/04/2023, 4:00 PM

## 2023-03-04 NOTE — Progress Notes (Signed)
[facility]                                                                               Birttany Holmes, is a 72 y.o. female, DOB - 1950/09/01, WUJ:[id number] Admit date - 03/02/2023    Outpatient Primary MD for the patient is [name], MD  LOS - 2  days    Brief summary    Cindy Holmes is a 72 y.o. female with medical history significant of hypertension, rheumatoid arthritis on immunosuppressives, CAD, chronic HFpEF, and history of CVA who presents with persistent diarrhea, nausea and vomiting.   Assessment & Plan    Assessment and Plan: * AKI (acute kidney injury) (HCC) Probably sec to persistent diarrhea with poor oral intake.  Much improved. Creatinine 1.2 stable.   Diarrhea Possibly overflow diarrhea.  -several months of persistent symptoms with PCR positive for enteropathogenic E. Coli during recent hospitalization. Symptoms persistent after completion of antibiotics.  - c diff pcr and toxin negative. Repeat GI panel is negative.  -GI consulted , she underwent colonoscopy showing sigmoid colon narrowing.  General surgery consulted for recommendations.   She will probably need ex lap and resection with colostomy.    Obesity, Class III, BMI 40-49.9 (morbid obesity) (HCC) BMI of 40  PAF (paroxysmal atrial fibrillation) (HCC) Eliquis on hold for the procedure.  Continue with metoprolol.   Rheumatoid arthritis (HCC) -pt is on methotrexate and plaquenil. Will hold now with ongoing infection.   Hyperlipidemia -Continue statin  Essential hypertension BP parameters are soft and on the lower side. Will decrease metoprolol to 12.5 mg BID.  Hold lasix for SBP<90 mmhg.   Bilateral leg edema:  Lasix restarted. Check strict intake and output.  venous duplex of the lower extremities is negative for DVT.    Hypokalemia And Hypomagnesemia Replaced.    Mild hyponatremia Sodium of 132 today.  Monitor.     Anemia of chronic disease:   Hemoglobin around 8. Continue to monitor.     Body mass index is 40.73 kg/m. Obesity.   Estimated body mass index is 40.73 kg/m as calculated from the following:   Height as of this encounter: 5\' 3"  (1.6 m).   Weight as of this encounter: 104.3 kg.  Code Status: full code.  DVT Prophylaxis:  lovenox.    Level of Care: Level of care: Telemetry Medical Family Communication: family at bedside.   Disposition Plan:     Remains inpatient appropriate:  persistent diarrhea.   Procedures:  CT abd and pelvis.   Consultants:   Gastroenterology Dr Elnoria Howard.   Antimicrobials:   Anti-infectives (From admission, onward)    Start     Dose/Rate Route Frequency Ordered Stop   03/02/23 2000  [MAR Hold]  azithromycin (ZITHROMAX) 500 mg in sodium chloride 0.9 % 250 mL IVPB        (MAR Hold since Fri 03/04/2023 at 1016.Hold Reason: Transfer to a Procedural area)   500 mg 250 mL/hr over 60 Minutes Intravenous Every 24 hours 03/02/23 1959          Medications  Scheduled Meds:  [MAR Hold] allopurinol  400 mg Oral QHS   [MAR Hold] aspirin  EC  81 mg Oral QHS   [MAR Hold] atorvastatin  80 mg Oral Daily   [MAR Hold] ezetimibe  10 mg Oral Daily   [MAR Hold] furosemide  40 mg Oral Daily   [MAR Hold] metoprolol tartrate  75 mg Oral BID   [MAR Hold] potassium chloride  40 mEq Oral BID   [MAR Hold] sodium bicarbonate  650 mg Oral BID   [MAR Hold] zolpidem  5 mg Oral QHS   Continuous Infusions:  sodium chloride 20 mL/hr at 03/03/23 2142   [facility] Hold] azithromycin 500 mg (03/03/23 2144)   magnesium sulfate bolus IVPB     PRN Meds:.[MAR Hold] ondansetron (ZOFRAN) IV    Subjective:   [name] was seen and examined today.  Leg edema slowly improving.   Objective:   Vitals:   03/04/23 1019 03/04/23 1136 03/04/23 1140 03/04/23 1150  BP: (!) 110/41 (!) 97/46 (!) 98/41 (!) 94/42  Pulse: 72 77 78 76  Resp: 16 17 (!) 21 19  Temp: 97.8 F (36.6 C) 97.9 F (36.6 C)    TempSrc: Temporal  Temporal    SpO2: 97% 96% 95% 100%  Weight:      Height:        Intake/Output Summary (Last 24 hours) at 03/04/2023 1204 Last data filed at 03/04/2023 1125 Gross per 24 hour  Intake 800.47 ml  Output 0 ml  Net 800.47 ml   Filed Weights   03/02/23 1115  Weight: 104.3 kg     Exam General exam: Appears calm and comfortable  Respiratory system: Clear to auscultation. Respiratory effort normal. Cardiovascular system: S1 & S2 heard, RRR.  Gastrointestinal system: Abdomen is nondistended, soft and nontender. Central nervous system: Alert and oriented.  Extremities: bilateral lower extremity edema.  Skin: No rashes,  Psychiatry:Mood & affect appropriate.      Data Reviewed:  I have personally reviewed following labs and imaging studies   CBC Lab Results  Component Value Date   WBC 7.5 03/04/2023   RBC 3.04 (L) 03/04/2023   HGB 8.9 (L) 03/04/2023   HCT 27.6 (L) 03/04/2023   MCV 90.8 03/04/2023   MCH 29.3 03/04/2023   PLT 270 03/04/2023   MCHC 32.2 03/04/2023   RDW 18.0 (H) 03/04/2023   LYMPHSABS 1.5 03/04/2023   MONOABS 0.9 03/04/2023   EOSABS 0.5 03/04/2023   BASOSABS 0.1 03/04/2023     Last metabolic panel Lab Results  Component Value Date   NA 132 (L) 03/04/2023   K 3.1 (L) 03/04/2023   CL 104 03/04/2023   CO2 19 (L) 03/04/2023   BUN 25 (H) 03/04/2023   CREATININE 1.21 (H) 03/04/2023   GLUCOSE 89 03/04/2023   GFRNONAA 48 (L) 03/04/2023   GFRAA >60 12/11/2018   CALCIUM 7.9 (L) 03/04/2023   PROT 5.6 (L) 03/02/2023   ALBUMIN 2.6 (L) 03/02/2023   LABGLOB 1.9 08/13/2022   AGRATIO 2.3 (H) 08/13/2022   BILITOT 0.8 03/02/2023   ALKPHOS 65 03/02/2023   AST 10 (L) 03/02/2023   ALT 9 03/02/2023   ANIONGAP 9 03/04/2023    CBG (last 3)  No results for input(s): "GLUCAP" in the last 72 hours.    Coagulation Profile: No results for input(s): "INR", "PROTIME" in the last 168 hours.   Radiology Studies: CT ABDOMEN PELVIS WO CONTRAST  Result Date:  03/03/2023 CLINICAL DATA:  Abdominal pain EXAM: CT ABDOMEN AND PELVIS WITHOUT CONTRAST TECHNIQUE: Multidetector CT imaging of the abdomen and pelvis was performed following the standard protocol  without IV contrast. RADIATION DOSE REDUCTION: This exam was performed according to the departmental dose-optimization program which includes automated exposure control, adjustment of the mA and/or kV according to patient size and/or use of iterative reconstruction technique. COMPARISON:  CT abdomen and pelvis 02/02/2023 FINDINGS: Lower chest: No acute abnormality. Hepatobiliary: No focal liver abnormality is seen. Status post cholecystectomy. No biliary dilatation. Pancreas: Unremarkable. No pancreatic ductal dilatation or surrounding inflammatory changes. Spleen: Normal in size without focal abnormality. Adrenals/Urinary Tract: There is a rounded hypodensity in the superior pole the right kidney measuring less than 1 cm, unchanged. There is no hydronephrosis or urinary tract calculus. The adrenal glands and bladder are within normal limits. Stomach/Bowel: Stomach is within normal limits. Gastric band in place. Appendix appears normal. No acute inflammation, pneumatosis or free air. There some subtle questionable wall thickening of the mid sigmoid colon measuring 5.5 cm in length on image 6/63. Vascular/Lymphatic: Aortic atherosclerosis. No enlarged abdominal or pelvic lymph nodes. Reproductive: Uterus and bilateral adnexa are unremarkable. Other: No abdominal wall hernia or abnormality. There is mild body wall edema. No abdominopelvic ascites. Musculoskeletal: Compression deformities of L2, L3 and L4 appear stable. IMPRESSION: 1. Questionable wall thickening of the mid sigmoid colon. Correlate clinically for colitis. Neoplasm can not be excluded. Recommend further evaluation with colonoscopy. 2. Mild body wall edema. 3. Stable hypodense lesion in the right kidney, indeterminate. This can be further evaluated with  ultrasound or MRI. 4. Gastric band in place. No bowel obstruction. 5. Aortic atherosclerosis. Aortic Atherosclerosis (ICD10-I70.0). Electronically Signed   By: Darliss Cheney M.D.   On: 03/03/2023 23:08   VAS [place] LOWER EXTREMITY VENOUS (DVT)  Result Date: 03/03/2023  Lower Venous DVT Study Patient Name:  NAKARI SARAO [facility]  Date of Exam:   03/03/2023 Medical Rec #: [id number]        Accession #:    [id number] Date of Birth: [birth year]       Patient Gender: F Patient Age:   45 years Exam Location:  [facility] Procedure:      VAS [place] LOWER EXTREMITY VENOUS (DVT) Referring Phys: Kathlen Mody --------------------------------------------------------------------------------  Indications: Swelling.  Comparison Study: No previous study. Performing Technologist: McKayla Maag RVT, [place]  Examination Guidelines: A complete evaluation includes B-mode imaging, spectral Doppler, color Doppler, and power Doppler as needed of all accessible portions of each vessel. Bilateral testing is considered an integral part of a complete examination. Limited examinations for reoccurring indications may be performed as noted. The reflux portion of the exam is performed with the patient in reverse Trendelenburg.  +--------+---------------+---------+-----------+----------+--------------------+ RIGHT   CompressibilityPhasicitySpontaneityPropertiesThrombus Aging       +--------+---------------+---------+-----------+----------+--------------------+ CFV     Full           Yes      Yes                                       +--------+---------------+---------+-----------+----------+--------------------+ SFJ     Full                                                              +--------+---------------+---------+-----------+----------+--------------------+ FV Prox Full                                                              +--------+---------------+---------+-----------+----------+--------------------+  FV Mid  Full                                                               +--------+---------------+---------+-----------+----------+--------------------+ FV                     Yes      Yes                  patent by color      Distal                                               doppler              +--------+---------------+---------+-----------+----------+--------------------+ PFV     Full                                                              +--------+---------------+---------+-----------+----------+--------------------+ POP                    Yes      Yes                  patent by color                                                           doppler              +--------+---------------+---------+-----------+----------+--------------------+ PTV                                                  Not visualized       +--------+---------------+---------+-----------+----------+--------------------+ PERO                                                 Not visualized       +--------+---------------+---------+-----------+----------+--------------------+   +--------+---------------+---------+-----------+----------+--------------------+ LEFT    CompressibilityPhasicitySpontaneityPropertiesThrombus Aging       +--------+---------------+---------+-----------+----------+--------------------+ CFV     Full           Yes      Yes                                       +--------+---------------+---------+-----------+----------+--------------------+ SFJ     Full                                                              +--------+---------------+---------+-----------+----------+--------------------+  FV Prox Full                                                              +--------+---------------+---------+-----------+----------+--------------------+ FV Mid  Full                                                               +--------+---------------+---------+-----------+----------+--------------------+ FV                     Yes      Yes                  patent by color      Distal                                               doppler              +--------+---------------+---------+-----------+----------+--------------------+ PFV     Full                                                              +--------+---------------+---------+-----------+----------+--------------------+ POP     Full           Yes      Yes                                       +--------+---------------+---------+-----------+----------+--------------------+ PTV                                                  Not visualized       +--------+---------------+---------+-----------+----------+--------------------+ PERO                                                 Not visualized       +--------+---------------+---------+-----------+----------+--------------------+     Summary: RIGHT: - There is no evidence of deep vein thrombosis in the lower extremity. However, portions of this examination were limited- see technologist comments above.  - No cystic structure found in the popliteal fossa.  LEFT: - There is no evidence of deep vein thrombosis in the lower extremity. However, portions of this examination were limited- see technologist comments above.  - No cystic structure found in the popliteal fossa.  *See table(s) above for measurements and observations. Electronically signed by Coral Else MD on 03/03/2023 at 9:40:06 PM.    Final  Kathlen Mody M.D. [facility] 03/04/2023, 12:04 PM  Available via Epic secure chat 7am-7pm After 7 pm, please refer to night coverage provider listed on amion.

## 2023-03-04 NOTE — Consult Note (Signed)
   [facility]  [[facility]] [facility] Inpatient Consult   03/04/2023  JESSIA LUSCO 1951/03/16 [id number]   [facility] [THN]  Accountable Care Organization [ACO] Patient: Medicare ACO REACH insurance  Primary Care Provider:  [name], MD with Robbie Lis is listed to provide the transition of care follow up  appointments  Patient screened for readmission hospitalizations with noted high risk score for unplanned readmission risk 5 day length of stay and to assess for potential [facility]  [[facility]] Care Management service needs for post hospital transition for care coordination.  Review of patient's electronic medical record reveals patient is admitted with acute kidney injury.   Plan:  Continue to follow progress and disposition to assess for post hospital community care coordination/management needs.  Referral request for community care coordination: pending disposition needs. Of note, [facility] Care Management/Population Health does not replace or interfere with any arrangements made by the Inpatient Transition of Care team.  For questions contact:   Charlesetta Shanks, RN BSN CCM [facility]  [phone] business mobile phone Toll free office [phone]  Fax number: [phone] [email or web address]    .

## 2023-03-05 ENCOUNTER — Inpatient Hospital Stay (HOSPITAL_COMMUNITY): Payer: Medicare Other

## 2023-03-05 DIAGNOSIS — N179 Acute kidney failure, unspecified: Secondary | ICD-10-CM | POA: Diagnosis not present

## 2023-03-05 DIAGNOSIS — I10 Essential (primary) hypertension: Secondary | ICD-10-CM | POA: Diagnosis not present

## 2023-03-05 DIAGNOSIS — E782 Mixed hyperlipidemia: Secondary | ICD-10-CM | POA: Diagnosis not present

## 2023-03-05 DIAGNOSIS — R197 Diarrhea, unspecified: Secondary | ICD-10-CM | POA: Diagnosis not present

## 2023-03-05 LAB — CBC WITH DIFFERENTIAL/PLATELET
Abs Immature Granulocytes: 0.04 10*3/uL (ref 0.00–0.07)
Basophils Absolute: 0.1 10*3/uL (ref 0.0–0.1)
Basophils Relative: 1 %
Eosinophils Absolute: 0.6 10*3/uL — ABNORMAL HIGH (ref 0.0–0.5)
Eosinophils Relative: 9 %
HCT: 26.4 % — ABNORMAL LOW (ref 36.0–46.0)
Hemoglobin: 8.4 g/dL — ABNORMAL LOW (ref 12.0–15.0)
Immature Granulocytes: 1 %
Lymphocytes Relative: 26 %
Lymphs Abs: 1.6 10*3/uL (ref 0.7–4.0)
MCH: 29.1 pg (ref 26.0–34.0)
MCHC: 31.8 g/dL (ref 30.0–36.0)
MCV: 91.3 fL (ref 80.0–100.0)
Monocytes Absolute: 0.7 10*3/uL (ref 0.1–1.0)
Monocytes Relative: 11 %
Neutro Abs: 3.3 10*3/uL (ref 1.7–7.7)
Neutrophils Relative %: 52 %
Platelets: 220 10*3/uL (ref 150–400)
RBC: 2.89 MIL/uL — ABNORMAL LOW (ref 3.87–5.11)
RDW: 18 % — ABNORMAL HIGH (ref 11.5–15.5)
WBC: 6.3 10*3/uL (ref 4.0–10.5)
nRBC: 0 % (ref 0.0–0.2)

## 2023-03-05 LAB — BRAIN NATRIURETIC PEPTIDE: B Natriuretic Peptide: 369.4 pg/mL — ABNORMAL HIGH (ref 0.0–100.0)

## 2023-03-05 LAB — BASIC METABOLIC PANEL
Anion gap: 10 (ref 5–15)
BUN: 21 mg/dL (ref 8–23)
CO2: 19 mmol/L — ABNORMAL LOW (ref 22–32)
Calcium: 7.6 mg/dL — ABNORMAL LOW (ref 8.9–10.3)
Chloride: 102 mmol/L (ref 98–111)
Creatinine, Ser: 1.21 mg/dL — ABNORMAL HIGH (ref 0.44–1.00)
GFR, Estimated: 48 mL/min — ABNORMAL LOW (ref >60)
Glucose, Bld: 89 mg/dL (ref 70–99)
Potassium: 2.7 mmol/L — CL (ref 3.5–5.1)
Sodium: 131 mmol/L — ABNORMAL LOW (ref 135–145)

## 2023-03-05 LAB — MAGNESIUM: Magnesium: 1.9 mg/dL (ref 1.7–2.4)

## 2023-03-05 LAB — OSMOLALITY: Osmolality: 282 mOsm/kg (ref 275–295)

## 2023-03-05 LAB — POTASSIUM: Potassium: 3.2 mmol/L — ABNORMAL LOW (ref 3.5–5.1)

## 2023-03-05 LAB — SODIUM, URINE, RANDOM: Sodium, Ur: <10 mmol/L

## 2023-03-05 LAB — LACTIC ACID, PLASMA: Lactic Acid, Venous: 1.9 mmol/L (ref 0.5–1.9)

## 2023-03-05 MED ORDER — IPRATROPIUM-ALBUTEROL 0.5-2.5 (3) MG/3ML IN SOLN: 3.0000 mL | RESPIRATORY_TRACT | Status: DC | PRN

## 2023-03-05 MED ORDER — LACTATED RINGERS IV BOLUS
500.0000 mL | Freq: Once | INTRAVENOUS | Status: AC
  Administered 2023-03-05: 500 mL via INTRAVENOUS

## 2023-03-05 MED ORDER — HYDROCORTISONE SOD SUC (PF) 100 MG IJ SOLR
100.0000 mg | Freq: Two times a day (BID) | INTRAMUSCULAR | Status: DC
  Administered 2023-03-05 – 2023-03-06 (×2): 100 mg via INTRAVENOUS
  Filled 2023-03-05 (×2): qty 2

## 2023-03-05 MED ORDER — POTASSIUM CHLORIDE 10 MEQ/100ML IV SOLN
10.0000 meq | INTRAVENOUS | Status: AC
  Administered 2023-03-05 (×3): 10 meq via INTRAVENOUS
  Filled 2023-03-05 (×3): qty 100

## 2023-03-05 MED ORDER — MIDODRINE HCL 5 MG PO TABS
5.0000 mg | ORAL_TABLET | Freq: Three times a day (TID) | ORAL | Status: DC
  Administered 2023-03-05 (×2): 5 mg via ORAL
  Filled 2023-03-05 (×2): qty 1

## 2023-03-05 MED ORDER — POTASSIUM CHLORIDE CRYS ER 20 MEQ PO TBCR
40.0000 meq | EXTENDED_RELEASE_TABLET | Freq: Once | ORAL | Status: AC
  Administered 2023-03-05: 40 meq via ORAL
  Filled 2023-03-05: qty 2

## 2023-03-05 MED ORDER — MIDODRINE HCL 5 MG PO TABS
10.0000 mg | ORAL_TABLET | Freq: Three times a day (TID) | ORAL | Status: DC
  Administered 2023-03-06 – 2023-03-08 (×5): 10 mg via ORAL
  Filled 2023-03-05 (×6): qty 2

## 2023-03-05 NOTE — Progress Notes (Signed)
[facility]                                                                               Cindy Holmes, is a 72 y.o. female, DOB - 09-05-1950, GNF:[id number] Admit date - 03/02/2023    Outpatient Primary MD for the patient is Cindy Nevins, MD  LOS - 3  days    Brief summary    Cindy Holmes is a 72 y.o. female with medical history significant of hypertension, rheumatoid arthritis on immunosuppressives, CAD, chronic HFpEF, and history of CVA who presents with persistent diarrhea, nausea and vomiting.   Assessment & Plan    Assessment and Plan: * AKI (acute kidney injury) (HCC) Probably sec to persistent diarrhea with poor oral intake.  Much improved. Creatinine 1.2 stable.   Diarrhea Possibly overflow diarrhea.  -several months of persistent symptoms with PCR positive for enteropathogenic E. Coli during recent hospitalization. Symptoms persistent after completion of antibiotics.  - c diff pcr and toxin negative. Repeat GI panel is negative.  -GI consulted , she underwent colonoscopy showing sigmoid colon narrowing.  General surgery consulted for recommendations.   She will probably need ex lap and resection with colostomy.  To be scheduled next week.    Obesity, Class III, BMI 40-49.9 (morbid obesity) (HCC) BMI of 40  PAF (paroxysmal atrial fibrillation) (HCC) Eliquis on hold for the procedure.  Metoprolol held for hypotension.   Rheumatoid arthritis (HCC) -pt is on methotrexate and plaquenil. Will hold now with ongoing infection.   Hyperlipidemia -Continue statin  Essential hypertension BP parameters are soft and on the lower side.  500 ml fluid bolus ordered.  Lactic acid wnl.  Stopped lasix and metoprolol.  Added midodrine today.     Bilateral leg edema:  Rule out CHF.  Get BNp, CXR, echocardiogram. Check strict intake and output.  venous duplex of the lower extremities is negative for DVT.    Hypokalemia And Hypomagnesemia Replaced.   Recheck in am.   Mild hyponatremia Sodium of 131 today.  Suspect a component of fluid overload.  Monitor. Get TSH, am cortisol, serum osmo, urine sodium.   Anemia of chronic disease:  Hemoglobin around 8. Continue to monitor.    Metabolic acidosis  Improving with sodium bicarb tablets.     Body mass index is 40.73 kg/m. Obesity.   Estimated body mass index is 40.73 kg/m as calculated from the following:   Height as of this encounter: 5\' 3"  (1.6 m).   Weight as of this encounter: 104.3 kg.  Code Status: full code.  DVT Prophylaxis:  lovenox.    Level of Care: Level of care: Telemetry Medical Family Communication: family at bedside.   Disposition Plan:     Remains inpatient appropriate:  persistent diarrhea.   Procedures:  CT abd and pelvis.   Consultants:   Gastroenterology Dr Cindy Holmes.  General surgery.   Antimicrobials:   Anti-infectives (From admission, onward)    Start     Dose/Rate Route Frequency Ordered Stop   03/02/23 2000  azithromycin (ZITHROMAX) 500 mg in sodium chloride 0.9 % 250 mL IVPB  Status:  Discontinued        500 mg 250  mL/hr over 60 Minutes Intravenous Every 24 hours 03/02/23 1959 03/04/23 1745        Medications  Scheduled Meds:  allopurinol  400 mg Oral QHS   aspirin EC  81 mg Oral QHS   atorvastatin  80 mg Oral Daily   enoxaparin (LOVENOX) injection  50 mg Subcutaneous Q24H   ezetimibe  10 mg Oral Daily   midodrine  5 mg Oral TID WC   sodium bicarbonate  650 mg Oral BID   zolpidem  5 mg Oral QHS   Continuous Infusions:   PRN Meds:.ondansetron (ZOFRAN) IV    Subjective:   Cindy Holmes was seen and examined today.  Leg edema slowly improving.   Objective:   Vitals:   03/04/23 1802 03/04/23 1947 03/05/23 0604 03/05/23 0903  BP: (!) 91/38 (!) 99/46 (!) 88/32 (!) 98/44  Pulse: 73 76 80 82  Resp: 20 18 18 18   Temp: 98 F (36.7 C) 98.3 F (36.8 C) 98.5 F (36.9 C) 98.5 F (36.9 C)  TempSrc: Oral Oral Oral Oral  SpO2:  99% 100% 98% 100%  Weight:      Height:        Intake/Output Summary (Last 24 hours) at 03/05/2023 1556 Last data filed at 03/05/2023 1500 Gross per 24 hour  Intake 842.07 ml  Output --  Net 842.07 ml   Filed Weights   03/02/23 1115  Weight: 104.3 kg     Exam General exam: Appears calm and comfortable  Respiratory system: Clear to auscultation. Respiratory effort normal. Cardiovascular system: S1 & S2 heard, RRR. No JVD Gastrointestinal system: Abdomen is nondistended, soft and nontender.  Central nervous system: Alert and oriented. Grossly non focal. Extremities: leg edema.  Skin: No rashes,  Psychiatry: Mood & affect appropriate.       Data Reviewed:  I have personally reviewed following labs and imaging studies   CBC Lab Results  Component Value Date   WBC 6.3 03/05/2023   RBC 2.89 (L) 03/05/2023   HGB 8.4 (L) 03/05/2023   HCT 26.4 (L) 03/05/2023   MCV 91.3 03/05/2023   MCH 29.1 03/05/2023   PLT 220 03/05/2023   MCHC 31.8 03/05/2023   RDW 18.0 (H) 03/05/2023   LYMPHSABS 1.6 03/05/2023   MONOABS 0.7 03/05/2023   EOSABS 0.6 (H) 03/05/2023   BASOSABS 0.1 03/05/2023     Last metabolic panel Lab Results  Component Value Date   NA 131 (L) 03/05/2023   K 2.7 (LL) 03/05/2023   CL 102 03/05/2023   CO2 19 (L) 03/05/2023   BUN 21 03/05/2023   CREATININE 1.21 (H) 03/05/2023   GLUCOSE 89 03/05/2023   GFRNONAA 48 (L) 03/05/2023   GFRAA >60 12/11/2018   CALCIUM 7.6 (L) 03/05/2023   PROT 5.6 (L) 03/02/2023   ALBUMIN 2.6 (L) 03/02/2023   LABGLOB 1.9 08/13/2022   AGRATIO 2.3 (H) 08/13/2022   BILITOT 0.8 03/02/2023   ALKPHOS 65 03/02/2023   AST 10 (L) 03/02/2023   ALT 9 03/02/2023   ANIONGAP 10 03/05/2023    CBG (last 3)  No results for input(s): "GLUCAP" in the last 72 hours.    Coagulation Profile: No results for input(s): "INR", "PROTIME" in the last 168 hours.   Radiology Studies: US RENAL  Result Date: 03/04/2023 CLINICAL DATA:  Renal  lesion. EXAM: RENAL / URINARY TRACT ULTRASOUND COMPLETE COMPARISON:  Noncontrast CT 03/03/2023 and older CTs FINDINGS: Right Kidney: Renal measurements: 9.4 x 4.6 x 5.4 cm = volume: 122.3 mL. Echogenicity within  normal limits. No mass or hydronephrosis visualized. Left Kidney: Renal measurements: 9.1 x 5.4 x 5.2 cm = volume: 132.5 mL. Mild left-sided renal atrophy, parenchymal loss. No collecting system dilatation or perinephric fluid. Bladder: Bladder is underdistended.  Grossly preserved contour. Other: None. IMPRESSION: No collecting system dilatation. Mild global atrophy of the left kidney. Of note no specific renal lesion identified. Overall, recommend either MRI evaluation or follow up CT scan in 6 months Electronically Signed   By: Karen Kays M.D.   On: 03/04/2023 18:39   CT ABDOMEN PELVIS WO CONTRAST  Result Date: 03/03/2023 CLINICAL DATA:  Abdominal pain EXAM: CT ABDOMEN AND PELVIS WITHOUT CONTRAST TECHNIQUE: Multidetector CT imaging of the abdomen and pelvis was performed following the standard protocol without IV contrast. RADIATION DOSE REDUCTION: This exam was performed according to the departmental dose-optimization program which includes automated exposure control, adjustment of the mA and/or kV according to patient size and/or use of iterative reconstruction technique. COMPARISON:  CT abdomen and pelvis 02/02/2023 FINDINGS: Lower chest: No acute abnormality. Hepatobiliary: No focal liver abnormality is seen. Status post cholecystectomy. No biliary dilatation. Pancreas: Unremarkable. No pancreatic ductal dilatation or surrounding inflammatory changes. Spleen: Normal in size without focal abnormality. Adrenals/Urinary Tract: There is a rounded hypodensity in the superior pole the right kidney measuring less than 1 cm, unchanged. There is no hydronephrosis or urinary tract calculus. The adrenal glands and bladder are within normal limits. Stomach/Bowel: Stomach is within normal limits. Gastric  band in place. Appendix appears normal. No acute inflammation, pneumatosis or free air. There some subtle questionable wall thickening of the mid sigmoid colon measuring 5.5 cm in length on image 6/63. Vascular/Lymphatic: Aortic atherosclerosis. No enlarged abdominal or pelvic lymph nodes. Reproductive: Uterus and bilateral adnexa are unremarkable. Other: No abdominal wall hernia or abnormality. There is mild body wall edema. No abdominopelvic ascites. Musculoskeletal: Compression deformities of L2, L3 and L4 appear stable. IMPRESSION: 1. Questionable wall thickening of the mid sigmoid colon. Correlate clinically for colitis. Neoplasm can not be excluded. Recommend further evaluation with colonoscopy. 2. Mild body wall edema. 3. Stable hypodense lesion in the right kidney, indeterminate. This can be further evaluated with ultrasound or MRI. 4. Gastric band in place. No bowel obstruction. 5. Aortic atherosclerosis. Aortic Atherosclerosis (ICD10-I70.0). Electronically Signed   By: Darliss Cheney M.D.   On: 03/03/2023 23:08       Kathlen Mody M.D. [facility] 03/05/2023, 3:56 PM  Available via Epic secure chat 7am-7pm After 7 pm, please refer to night coverage provider listed on amion.

## 2023-03-05 NOTE — Plan of Care (Signed)
  Problem: Nutrition: Goal: Adequate nutrition will be maintained Outcome: Progressing   Problem: Elimination: Goal: Will not experience complications related to bowel motility Outcome: Progressing Goal: Will not experience complications related to urinary retention Outcome: Progressing   

## 2023-03-05 NOTE — Plan of Care (Signed)

## 2023-03-06 ENCOUNTER — Inpatient Hospital Stay (HOSPITAL_COMMUNITY): Payer: Medicare Other

## 2023-03-06 ENCOUNTER — Encounter (HOSPITAL_COMMUNITY): Payer: Self-pay | Admitting: Gastroenterology

## 2023-03-06 DIAGNOSIS — E8770 Fluid overload, unspecified: Secondary | ICD-10-CM | POA: Diagnosis not present

## 2023-03-06 DIAGNOSIS — Z79899 Other long term (current) drug therapy: Secondary | ICD-10-CM

## 2023-03-06 DIAGNOSIS — N179 Acute kidney failure, unspecified: Secondary | ICD-10-CM | POA: Diagnosis not present

## 2023-03-06 DIAGNOSIS — I10 Essential (primary) hypertension: Secondary | ICD-10-CM | POA: Diagnosis not present

## 2023-03-06 DIAGNOSIS — Z993 Dependence on wheelchair: Secondary | ICD-10-CM

## 2023-03-06 DIAGNOSIS — K56699 Other intestinal obstruction unspecified as to partial versus complete obstruction: Secondary | ICD-10-CM | POA: Diagnosis present

## 2023-03-06 DIAGNOSIS — R197 Diarrhea, unspecified: Secondary | ICD-10-CM | POA: Diagnosis not present

## 2023-03-06 DIAGNOSIS — E782 Mixed hyperlipidemia: Secondary | ICD-10-CM | POA: Diagnosis not present

## 2023-03-06 LAB — ECHOCARDIOGRAM COMPLETE
Area-P 1/2: 4.15 cm2
Height: 63 in
MV M vel: 4.55 m/s
MV Peak grad: 82.8 mmHg
Radius: 0.3 cm
S' Lateral: 3 cm
Weight: 3679.04 [oz_av]

## 2023-03-06 LAB — BASIC METABOLIC PANEL
Anion gap: 7 (ref 5–15)
BUN: 22 mg/dL (ref 8–23)
CO2: 21 mmol/L — ABNORMAL LOW (ref 22–32)
Calcium: 7.6 mg/dL — ABNORMAL LOW (ref 8.9–10.3)
Chloride: 103 mmol/L (ref 98–111)
Creatinine, Ser: 1.41 mg/dL — ABNORMAL HIGH (ref 0.44–1.00)
GFR, Estimated: 40 mL/min — ABNORMAL LOW (ref >60)
Glucose, Bld: 131 mg/dL — ABNORMAL HIGH (ref 70–99)
Potassium: 4 mmol/L (ref 3.5–5.1)
Sodium: 131 mmol/L — ABNORMAL LOW (ref 135–145)

## 2023-03-06 LAB — CBC WITH DIFFERENTIAL/PLATELET
Abs Immature Granulocytes: 0.03 10*3/uL (ref 0.00–0.07)
Basophils Absolute: 0 10*3/uL (ref 0.0–0.1)
Basophils Relative: 1 %
Eosinophils Absolute: 0 10*3/uL (ref 0.0–0.5)
Eosinophils Relative: 0 %
HCT: 27.6 % — ABNORMAL LOW (ref 36.0–46.0)
Hemoglobin: 8.9 g/dL — ABNORMAL LOW (ref 12.0–15.0)
Immature Granulocytes: 1 %
Lymphocytes Relative: 19 %
Lymphs Abs: 0.8 10*3/uL (ref 0.7–4.0)
MCH: 30.3 pg (ref 26.0–34.0)
MCHC: 32.2 g/dL (ref 30.0–36.0)
MCV: 93.9 fL (ref 80.0–100.0)
Monocytes Absolute: 0.2 10*3/uL (ref 0.1–1.0)
Monocytes Relative: 5 %
Neutro Abs: 3.3 10*3/uL (ref 1.7–7.7)
Neutrophils Relative %: 74 %
Platelets: 241 10*3/uL (ref 150–400)
RBC: 2.94 MIL/uL — ABNORMAL LOW (ref 3.87–5.11)
RDW: 17.9 % — ABNORMAL HIGH (ref 11.5–15.5)
WBC: 4.4 10*3/uL (ref 4.0–10.5)
nRBC: 0 % (ref 0.0–0.2)

## 2023-03-06 LAB — HEPATIC FUNCTION PANEL
ALT: 9 U/L (ref 0–44)
AST: 12 U/L — ABNORMAL LOW (ref 15–41)
Albumin: 2.2 g/dL — ABNORMAL LOW (ref 3.5–5.0)
Alkaline Phosphatase: 52 U/L (ref 38–126)
Bilirubin, Direct: <0.1 mg/dL (ref 0.0–0.2)
Total Bilirubin: 0.5 mg/dL (ref 0.3–1.2)
Total Protein: 4.9 g/dL — ABNORMAL LOW (ref 6.5–8.1)

## 2023-03-06 LAB — MAGNESIUM: Magnesium: 2 mg/dL (ref 1.7–2.4)

## 2023-03-06 LAB — TSH: TSH: 1.916 u[IU]/mL (ref 0.350–4.500)

## 2023-03-06 LAB — CORTISOL: Cortisol, Plasma: >100 ug/dL

## 2023-03-06 MED ORDER — METRONIDAZOLE 500 MG PO TABS
1000.0000 mg | ORAL_TABLET | ORAL | Status: AC
  Administered 2023-03-06 (×3): 1000 mg via ORAL
  Filled 2023-03-06 (×3): qty 2

## 2023-03-06 MED ORDER — SODIUM CHLORIDE 0.9 % IV SOLN
2.0000 g | INTRAVENOUS | Status: DC
  Filled 2023-03-06: qty 2

## 2023-03-06 MED ORDER — FUROSEMIDE 20 MG PO TABS
20.0000 mg | ORAL_TABLET | Freq: Two times a day (BID) | ORAL | Status: DC
  Administered 2023-03-06 – 2023-03-08 (×4): 20 mg via ORAL
  Filled 2023-03-06 (×5): qty 1

## 2023-03-06 MED ORDER — NEOMYCIN SULFATE 500 MG PO TABS
1000.0000 mg | ORAL_TABLET | ORAL | Status: AC
  Administered 2023-03-06 (×3): 1000 mg via ORAL
  Filled 2023-03-06 (×3): qty 2

## 2023-03-06 MED ORDER — GABAPENTIN 100 MG PO CAPS: 200.0000 mg | ORAL_CAPSULE | ORAL | Status: DC

## 2023-03-06 MED ORDER — POLYETHYLENE GLYCOL 3350 17 G PO PACK
17.0000 g | PACK | Freq: Two times a day (BID) | ORAL | Status: DC
  Administered 2023-03-06 (×2): 17 g via ORAL
  Filled 2023-03-06 (×5): qty 1

## 2023-03-06 MED ORDER — BISACODYL 10 MG RE SUPP: 10.0000 mg | Freq: Two times a day (BID) | RECTAL | Status: DC | PRN

## 2023-03-06 MED ORDER — MUPIROCIN 2 % EX OINT
1.0000 | TOPICAL_OINTMENT | Freq: Two times a day (BID) | CUTANEOUS | Status: AC
  Administered 2023-03-07 – 2023-03-11 (×9): 1 via NASAL
  Filled 2023-03-06 (×4): qty 22

## 2023-03-06 MED ORDER — ALVIMOPAN 12 MG PO CAPS
12.0000 mg | ORAL_CAPSULE | ORAL | Status: DC
  Filled 2023-03-06: qty 1

## 2023-03-06 MED ORDER — SODIUM CHLORIDE 0.9 % IV SOLN: INTRAVENOUS | Status: DC

## 2023-03-06 MED ORDER — ENOXAPARIN SODIUM 60 MG/0.6ML IJ SOSY
50.0000 mg | PREFILLED_SYRINGE | INTRAMUSCULAR | Status: DC
  Administered 2023-03-06: 50 mg via SUBCUTANEOUS
  Filled 2023-03-06 (×2): qty 0.6

## 2023-03-06 MED ORDER — LACTATED RINGERS IV BOLUS: 1000.0000 mL | Freq: Three times a day (TID) | INTRAVENOUS | Status: DC | PRN

## 2023-03-06 MED ORDER — ENSURE PRE-SURGERY PO LIQD
296.0000 mL | Freq: Once | ORAL | Status: DC
  Filled 2023-03-06: qty 296

## 2023-03-06 MED ORDER — ACETAMINOPHEN 500 MG PO TABS
1000.0000 mg | ORAL_TABLET | ORAL | Status: AC
  Administered 2023-03-07: 1000 mg via ORAL
  Filled 2023-03-06: qty 2

## 2023-03-06 MED ORDER — ENSURE PRE-SURGERY PO LIQD
592.0000 mL | Freq: Once | ORAL | Status: AC
  Administered 2023-03-06: 592 mL via ORAL
  Filled 2023-03-06: qty 592

## 2023-03-06 MED ORDER — ENOXAPARIN SODIUM 40 MG/0.4ML IJ SOSY
40.0000 mg | PREFILLED_SYRINGE | Freq: Once | INTRAMUSCULAR | Status: AC
  Administered 2023-03-07: 40 mg via SUBCUTANEOUS
  Filled 2023-03-06: qty 0.4

## 2023-03-06 NOTE — Progress Notes (Signed)
03/06/2023  Cindy Holmes [id number] 06-12-1951  CARE TEAM: PCP: [name], MD  Outpatient Care Team: Patient Care Team: [name], MD as PCP - General (Internal Medicine) Meriam Sprague, MD as PCP - Cardiology (Cardiology)  Inpatient Treatment Team: Treatment Team:  Kathlen Mody, MD Barrie Dunker, MD Jeani Hawking, MD [facility], Md, MD Velia Meyer, RN Clydia Llano, RN Roselind Messier, RN Joaquim Nam, [facility] Dellie Burns E, LCSW   Problem List:   Principal Problem:   AKI (acute kidney injury) [facility]) Active Problems:   Stricture of sigmoid colon [facility])   Essential hypertension   Hyperlipidemia   Rheumatoid arthritis (HCC)   PAF (paroxysmal atrial fibrillation) (HCC)   Diarrhea   Morbid obesity with body mass index (BMI) of 40.0 to 49.9 (HCC)   Immunosuppression due to drug therapy (HCC)   03/04/2023  Procedure(s): COLONOSCOPY WITH PROPOFOL BIOPSY    Assessment [facility] Stay = 4 days)  Partial colon obstruction from sigmoid stricture with repeated hospitalizations.    Plan:  -Patient would benefit from segmental colonic resection of the stricture.   The anatomy & physiology of the digestive tract was discussed.  The pathophysiology of the colon was discussed.  Natural history risks without surgery was discussed.   I feel the risks of no intervention will lead to serious problems that outweigh the operative risks; therefore, I recommended a partial colectomy to remove the pathology.  Minimally invasive (Robotic/Laparoscopic) & open techniques were discussed.   Risks such as bleeding, infection, abscess, leak, reoperation, injury to other organs, need for repair of tissues / organs, possible ostomy, hernia, heart attack, stroke, death, and other risks were discussed.  I noted a good likelihood this will help address the problem.   Goals of post-operative recovery were discussed as well.   Need for adequate nutrition, daily bowel regimen  and healthy physical activity, to optimize recovery was noted as well. We will work to minimize complications.  Educational materials were available as well.  Questions were answered.  The patient expresses understanding & wishes to proceed with surgery.   Awaiting cardiac clearance.  Echocardiogram results pending  Patient cleaned out with a colonoscopy.  Will do low-dose MiraLAX today and PO prophylactic oral antibiotics.  Usually more urgent situations patient is often need Hartmann resection with colostomy.  With her being on chronic immunosuppression and wheelchair-bound, most likely back in the plan.  However she is not massively dilated and is no longer on her immunosuppressive's which are not severe.  Perhaps can do one stage.  Ultimately up to the operating surgeon.  Possibly can do colectomy as soon as Monday tomorrow.  Final decision by Dr. Luisa Hart who is coming on the service for the week depending on availability & other service needs  -VTE prophylaxis- SCDs, etc  -mobilize as tolerated to help recovery      I reviewed nursing notes, hospitalist notes, last 24 h vitals and pain scores, last 48 h intake and output, last 24 h labs and trends, and last 24 h imaging results.  I have reviewed this patient's available data, including medical history, events of note, test results, etc as part of my evaluation.   A significant portion of that time was spent in counseling. Care during the described time interval was provided by me.  This care required moderate level of medical decision making.  03/06/2023    Subjective: (Chief complaint)  Denies much pain.  Minimal nausea.   Objective:  Vital signs:  Vitals:   03/05/23 1622 03/05/23 2127 03/06/23 0529 03/06/23 0835  BP: (!) 94/36 (!) 100/53 (!) 98/48 (!) 102/48  Pulse: 64 75 79 80  Resp: 18 18 18 17   Temp: 98.3 F (36.8 C) 98.1 F (36.7 C) 97.7 F (36.5 C) 98.2 F (36.8 C)  TempSrc:  Oral Oral   SpO2: 96% 100% 99%  99%  Weight:      Height:        Last BM Date : 03/05/23  Intake/Output   Yesterday:  08/24 0701 - 08/25 0700 In: 1607.4 [P.O.:800; IV Piggyback:807.4] Out: 0  This shift:  Total I/O In: 240 [P.O.:240] Out: 0   Bowel function:  Flatus: YES  BM:  YES  Drain: (No drain)   Physical Exam:  General: Pt awake/alert in no acute distress Eyes: PERRL, normal EOM.  Sclera clear.  No icterus Neuro: CN II-XII intact w/o focal sensory/motor deficits. Lymph: No head/neck/groin lymphadenopathy Psych:  No delerium/psychosis/paranoia.  Oriented x 4 HENT: Normocephalic, Mucus membranes moist.  No thrush Neck: Supple, No tracheal deviation.  No obvious thyromegaly Chest: No pain to chest wall compression.  Good respiratory excursion.  No audible wheezing CV:  Pulses intact.  Regular rhythm.  No major extremity edema MS: Normal AROM mjr joints.  No obvious deformity  Abdomen: Soft.  Nondistended.  Nontender. Obese.   No evidence of peritonitis.  No incarcerated hernias.  Ext:  No deformity.  No mjr edema.  No cyanosis Skin: No petechiae / purpurea.  No major sores.  Warm and dry    Results:   Cultures: Recent Results (from the past 720 hour(s))  Gastrointestinal Panel by PCR , Stool     Status: None   Collection Time: 03/02/23  4:10 PM   Specimen: Stool  Result Value Ref Range Status   Campylobacter species NOT DETECTED NOT DETECTED Final   Plesimonas shigelloides NOT DETECTED NOT DETECTED Final   Salmonella species NOT DETECTED NOT DETECTED Final   Yersinia enterocolitica NOT DETECTED NOT DETECTED Final   Vibrio species NOT DETECTED NOT DETECTED Final   Vibrio cholerae NOT DETECTED NOT DETECTED Final   Enteroaggregative E coli (EAEC) NOT DETECTED NOT DETECTED Final   Enteropathogenic E coli (EPEC) NOT DETECTED NOT DETECTED Final   Enterotoxigenic E coli (ETEC) NOT DETECTED NOT DETECTED Final   Shiga like toxin producing E coli (STEC) NOT DETECTED NOT DETECTED Final    Shigella/Enteroinvasive E coli (EIEC) NOT DETECTED NOT DETECTED Final   Cryptosporidium NOT DETECTED NOT DETECTED Final   Cyclospora cayetanensis NOT DETECTED NOT DETECTED Final   Entamoeba histolytica NOT DETECTED NOT DETECTED Final   Giardia lamblia NOT DETECTED NOT DETECTED Final   Adenovirus F40/41 NOT DETECTED NOT DETECTED Final   Astrovirus NOT DETECTED NOT DETECTED Final   Norovirus GI/GII NOT DETECTED NOT DETECTED Final   Rotavirus A NOT DETECTED NOT DETECTED Final   Sapovirus (I, II, IV, and V) NOT DETECTED NOT DETECTED Final    Comment: Performed at [facility], [address] [place], [place] [address]  C Difficile Quick Screen w PCR reflex     Status: None   Collection Time: 03/02/23  4:10 PM   Specimen: Stool  Result Value Ref Range Status   C Diff antigen NEGATIVE NEGATIVE Final   C Diff toxin NEGATIVE NEGATIVE Final   C Diff interpretation No C. difficile detected.  Final    Comment: Performed at [facility], [address], [place], [place] [address]  Labs: Results for orders placed or performed during the hospital encounter of 03/02/23 (from the past 48 hour(s))  CBC with Differential/Platelet     Status: Abnormal   Collection Time: 03/05/23  4:59 AM  Result Value Ref Range   WBC 6.3 4.0 - 10.5 K/uL   RBC 2.89 (L) 3.87 - 5.11 MIL/uL   Hemoglobin 8.4 (L) 12.0 - 15.0 g/dL   HCT 16.1 (L) 09.6 - 04.5 %   MCV 91.3 80.0 - 100.0 fL   MCH 29.1 26.0 - 34.0 pg   MCHC 31.8 30.0 - 36.0 g/dL   RDW 40.9 (H) 81.1 - 91.4 %   Platelets 220 150 - 400 K/uL   nRBC 0.0 0.0 - 0.2 %   Neutrophils Relative % 52 %   Neutro Abs 3.3 1.7 - 7.7 K/uL   Lymphocytes Relative 26 %   Lymphs Abs 1.6 0.7 - 4.0 K/uL   Monocytes Relative 11 %   Monocytes Absolute 0.7 0.1 - 1.0 K/uL   Eosinophils Relative 9 %   Eosinophils Absolute 0.6 (H) 0.0 - 0.5 K/uL   Basophils Relative 1 %   Basophils Absolute 0.1 0.0 - 0.1 K/uL   Immature Granulocytes 1 %   Abs Immature  Granulocytes 0.04 0.00 - 0.07 K/uL    Comment: Performed at [facility], [address] [place], [place] [address]  Basic metabolic panel     Status: Abnormal   Collection Time: 03/05/23  4:59 AM  Result Value Ref Range   Sodium 131 (L) 135 - 145 mmol/L   Potassium 2.7 (LL) 3.5 - 5.1 mmol/L    Comment: CRITICAL RESULT CALLED TO, READ BACK BY AND VERIFIED WITH Lucilla Lame, RN. [phone] 03/05/23. LPAIT   Chloride 102 98 - 111 mmol/L   CO2 19 (L) 22 - 32 mmol/L   Glucose, Bld 89 70 - 99 mg/dL    Comment: Glucose reference range applies only to samples taken after fasting for at least 8 hours.   BUN 21 8 - 23 mg/dL   Creatinine, Ser 0.86 (H) 0.44 - 1.00 mg/dL   Calcium 7.6 (L) 8.9 - 10.3 mg/dL   GFR, Estimated 48 (L) >60 mL/min    Comment: (NOTE) Calculated using the CKD-EPI Creatinine Equation (2021)    Anion gap 10 5 - 15    Comment: Performed at [facility], [address], [place], [place] [address]  Magnesium     Status: None   Collection Time: 03/05/23  4:59 AM  Result Value Ref Range   Magnesium 1.9 1.7 - 2.4 mg/dL    Comment: Performed at [facility], [address] [place], [place] [address]  Brain natriuretic peptide     Status: Abnormal   Collection Time: 03/05/23  4:59 AM  Result Value Ref Range   B Natriuretic Peptide 369.4 (H) 0.0 - 100.0 pg/mL    Comment: Performed at [facility], [address], [place], [place] [address]  Lactic acid, plasma     Status: None   Collection Time: 03/05/23  9:27 AM  Result Value Ref Range   Lactic Acid, Venous 1.9 0.5 - 1.9 mmol/L    Comment: Performed at [facility], [address], [place], [place] [address]  Sodium, urine, random     Status: None   Collection Time: 03/05/23  4:23 PM  Result Value Ref Range   Sodium, Ur <10 mmol/L    Comment: REPEATED TO VERIFY Performed at  [facility], [address], [place], [place] [address]   Potassium     Status: Abnormal   Collection Time: 03/05/23   7:25 PM  Result Value Ref Range   Potassium 3.2 (L) 3.5 - 5.1 mmol/L    Comment: Performed at [facility], [address] [place], [place] [address]  Osmolality     Status: None   Collection Time: 03/05/23  7:25 PM  Result Value Ref Range   Osmolality 282 275 - 295 mOsm/kg    Comment: Performed at [facility], [address], [place], [place] [address]  Basic metabolic panel     Status: Abnormal   Collection Time: 03/06/23  6:24 AM  Result Value Ref Range   Sodium 131 (L) 135 - 145 mmol/L   Potassium 4.0 3.5 - 5.1 mmol/L   Chloride 103 98 - 111 mmol/L   CO2 21 (L) 22 - 32 mmol/L   Glucose, Bld 131 (H) 70 - 99 mg/dL    Comment: Glucose reference range applies only to samples taken after fasting for at least 8 hours.   BUN 22 8 - 23 mg/dL   Creatinine, Ser 4.13 (H) 0.44 - 1.00 mg/dL   Calcium 7.6 (L) 8.9 - 10.3 mg/dL   GFR, Estimated 40 (L) >60 mL/min    Comment: (NOTE) Calculated using the CKD-EPI Creatinine Equation (2021)    Anion gap 7 5 - 15    Comment: Performed at [facility], [address] [place], [place] [address]  Magnesium     Status: None   Collection Time: 03/06/23  6:24 AM  Result Value Ref Range   Magnesium 2.0 1.7 - 2.4 mg/dL    Comment: Performed at [facility], [address], [place], [place] [address]  CBC with Differential/Platelet     Status: Abnormal   Collection Time: 03/06/23  6:24 AM  Result Value Ref Range   WBC 4.4 4.0 - 10.5 K/uL   RBC 2.94 (L) 3.87 - 5.11 MIL/uL   Hemoglobin 8.9 (L) 12.0 - 15.0 g/dL   HCT 36.6 (L) 44.0 - 34.7 %   MCV 93.9 80.0 - 100.0 fL   MCH 30.3 26.0 - 34.0 pg   MCHC 32.2 30.0 - 36.0 g/dL   RDW 42.5 (H) 95.6 - 38.7 %   Platelets 241 150 - 400 K/uL   nRBC 0.0 0.0 - 0.2 %   Neutrophils Relative % 74 %   Neutro Abs 3.3 1.7 - 7.7 K/uL   Lymphocytes Relative 19 %   Lymphs Abs 0.8 0.7 - 4.0 K/uL   Monocytes Relative 5 %   Monocytes Absolute 0.2 0.1 - 1.0 K/uL   Eosinophils Relative 0 %    Eosinophils Absolute 0.0 0.0 - 0.5 K/uL   Basophils Relative 1 %   Basophils Absolute 0.0 0.0 - 0.1 K/uL   Immature Granulocytes 1 %   Abs Immature Granulocytes 0.03 0.00 - 0.07 K/uL    Comment: Performed at [facility], [address], [place], [place] [address]  Cortisol     Status: None   Collection Time: 03/06/23  6:24 AM  Result Value Ref Range   Cortisol, Plasma >100.0 ug/dL    Comment: RESULT CONFIRMED BY MANUAL DILUTION (NOTE) AM    6.7 - 22.6 ug/dL PM   <29.5       ug/dL Performed at [facility], [address], [place], [place] [address]   TSH  Status: None   Collection Time: 03/06/23  6:24 AM  Result Value Ref Range   TSH 1.916 0.350 - 4.500 uIU/mL    Comment: Performed by a 3rd Generation assay with a functional sensitivity of <=0.01 uIU/mL. Performed at [facility], [address] [place], [place] [address]     Imaging / Studies: DG CHEST PORT 1 VIEW  Result Date: 03/05/2023 CLINICAL DATA:  Wheezing EXAM: PORTABLE CHEST 1 VIEW COMPARISON:  02/02/2023 FINDINGS: Cardiac shadow is within normal limits. Mild aortic calcifications are seen. The lungs are well aerated bilaterally. Mild central vascular congestion is again noted. No significant edema is seen. No focal infiltrate is noted. No bony abnormality is seen. IMPRESSION: Mild vascular congestion stable from the prior exam. Electronically Signed   By: Alcide Clever M.D.   On: 03/05/2023 19:05   US RENAL  Result Date: 03/04/2023 CLINICAL DATA:  Renal lesion. EXAM: RENAL / URINARY TRACT ULTRASOUND COMPLETE COMPARISON:  Noncontrast CT 03/03/2023 and older CTs FINDINGS: Right Kidney: Renal measurements: 9.4 x 4.6 x 5.4 cm = volume: 122.3 mL. Echogenicity within normal limits. No mass or hydronephrosis visualized. Left Kidney: Renal measurements: 9.1 x 5.4 x 5.2 cm = volume: 132.5 mL. Mild left-sided renal atrophy, parenchymal loss. No collecting system dilatation or perinephric fluid. Bladder: Bladder  is underdistended.  Grossly preserved contour. Other: None. IMPRESSION: No collecting system dilatation. Mild global atrophy of the left kidney. Of note no specific renal lesion identified. Overall, recommend either MRI evaluation or follow up CT scan in 6 months Electronically Signed   By: Karen Kays M.D.   On: 03/04/2023 18:39    Medications / Allergies: per chart  Antibiotics: Anti-infectives (From admission, onward)    Start     Dose/Rate Route Frequency Ordered Stop   03/02/23 2000  azithromycin (ZITHROMAX) 500 mg in sodium chloride 0.9 % 250 mL IVPB  Status:  Discontinued        500 mg 250 mL/hr over 60 Minutes Intravenous Every 24 hours 03/02/23 1959 03/04/23 1745         Note: Portions of this report may have been transcribed using voice recognition software. Every effort was made to ensure accuracy; however, inadvertent computerized transcription errors may be present.   Any transcriptional errors that result from this process are unintentional.    Ardeth Sportsman, MD, FACS, MASCRS Esophageal, Gastrointestinal & Colorectal Surgery Robotic and Minimally Invasive Surgery  [facility] [address], [address] [place], [place] [address] [phone] Fax [phone] Main  CONTACT INFORMATION: Weekday (9AM-5PM): Call [facility] main office at [phone] Weeknight (5PM-9AM) or Weekend/Holiday: Check EPIC "Web Links" tab & use "AMION" (password " [email or web address]") for General Surgery [facility] coverage  Please, DO NOT use SecureChat  (it is not reliable communication to reach operating surgeons & will lead to a delay in care).   Epic staff messaging available for outptient concerns needing 1-2 business day response.      03/06/2023  9:33 AM

## 2023-03-06 NOTE — Progress Notes (Signed)
  Echocardiogram 2D Echocardiogram has been performed.  Cindy Holmes 03/06/2023, 1:49 PM

## 2023-03-06 NOTE — Plan of Care (Signed)
  Problem: Clinical Measurements: Goal: Respiratory complications will improve Outcome: Progressing Goal: Cardiovascular complication will be avoided Outcome: Progressing   Problem: Activity: Goal: Risk for activity intolerance will decrease Outcome: Progressing   Problem: Elimination: Goal: Will not experience complications related to bowel motility Outcome: Progressing Goal: Will not experience complications related to urinary retention Outcome: Progressing

## 2023-03-06 NOTE — Progress Notes (Signed)
[facility]                                                                               Cindy Holmes, is a 72 y.o. female, DOB - 01-26-1951, BJY:[id number] Admit date - 03/02/2023    Outpatient Primary MD for the patient is [name], MD  LOS - 4  days    Brief summary    Cindy Holmes is a 72 y.o. female with medical history significant of hypertension, rheumatoid arthritis on immunosuppressives, CAD, chronic HFpEF, and history of CVA who presents with persistent diarrhea, nausea and vomiting.   Assessment & Plan    Assessment and Plan: * AKI (acute kidney injury) (HCC) Initially thought secondary to diarrhea and poor oral intake, sightly improved to 1.2 with fluids but a little higher today to 1.4.   Diarrhea Possibly overflow diarrhea.  -several months of persistent symptoms with PCR positive for enteropathogenic E. Coli during recent hospitalization.  Symptoms persistented after completion of antibiotics.  - c diff pcr and toxin negative. Repeat GI panel is negative.  -GI consulted , she underwent colonoscopy showing sigmoid colon narrowing.  General surgery consulted for recommendations.   She will probably need ex lap and resection with colostomy.  To be scheduled.    Obesity, Class III, BMI 40-49.9 (morbid obesity) (HCC) BMI of 40  PAF (paroxysmal atrial fibrillation) (HCC) Eliquis on hold for the procedure.  Metoprolol held for hypotension.   Rheumatoid arthritis (HCC) -pt is on methotrexate and plaquenil which are on hold for now.   Hyperlipidemia -Continue statin  Essential hypertension BP parameters are on the softer side.  Midodrine added with improvement in BP parameters.   Bilateral leg edema:  Rule out CHF.  BNP elevated, CXR showing vascular congestion.  Echocardiogram pending.  monitor strict intake and output.  venous duplex of the lower extremities is negative for DVT.    Hypokalemia And Hypomagnesemia Replaced.   Recheck in am.   Mild hyponatremia Sodium of 131 today.  Suspect a component of fluid overload.  TSH wnl. Urine sodium less than 10.  Serum osmo is 282.   Anemia of chronic disease:  Hemoglobin around 8. Continue to monitor.    Metabolic acidosis  Improving with sodium bicarb tablets.     Body mass index is 40.73 kg/m. Obesity.   Estimated body mass index is 40.73 kg/m as calculated from the following:   Height as of this encounter: 5\' 3"  (1.6 m).   Weight as of this encounter: 104.3 kg.  Code Status: full code.  DVT Prophylaxis:  enoxaparin (LOVENOX) injection 40 mg Start: 03/06/23 1130 SCD's Start: 03/06/23 1038lovenox.    Level of Care: Level of care: Telemetry Medical Family Communication: none at bedside.   Disposition Plan:     Remains inpatient appropriate:  pending OR next week.   Procedures:  CT abd and pelvis.  Echocardiogram.   Consultants:   Gastroenterology Dr Elnoria Howard.  General surgery.   Antimicrobials:   Anti-infectives (From admission, onward)    Start     Dose/Rate Route Frequency Ordered Stop   03/06/23 1400  neomycin (MYCIFRADIN) tablet 1,000 mg  Placed in "And" Linked Group   1,000 mg Oral 3 times per day 03/06/23 1037 03/07/23 1359   03/06/23 1400  metroNIDAZOLE (FLAGYL) tablet 1,000 mg       Placed in "And" Linked Group   1,000 mg Oral 3 times per day 03/06/23 1037 03/07/23 1359   03/06/23 1130  cefoTEtan (CEFOTAN) 2 g in sodium chloride 0.9 % 100 mL IVPB        2 g 200 mL/hr over 30 Minutes Intravenous On call to O.R. 03/06/23 1037 03/07/23 0559   03/02/23 2000  azithromycin (ZITHROMAX) 500 mg in sodium chloride 0.9 % 250 mL IVPB  Status:  Discontinued        500 mg 250 mL/hr over 60 Minutes Intravenous Every 24 hours 03/02/23 1959 03/04/23 1745        Medications  Scheduled Meds:  acetaminophen  1,000 mg Oral On Call to OR   allopurinol  400 mg Oral QHS   alvimopan  12 mg Oral On Call to OR   aspirin EC  81 mg Oral QHS    atorvastatin  80 mg Oral Daily   enoxaparin (LOVENOX) injection  40 mg Subcutaneous Once   enoxaparin (LOVENOX) injection  50 mg Subcutaneous Q24H   ezetimibe  10 mg Oral Daily   [START ON 03/07/2023] feeding supplement  296 mL Oral Once   feeding supplement  592 mL Oral Once   furosemide  20 mg Oral BID   gabapentin  200 mg Oral On Call to OR   neomycin  1,000 mg Oral 3 times per day   And   metroNIDAZOLE  1,000 mg Oral 3 times per day   midodrine  10 mg Oral TID WC   polyethylene glycol  17 g Oral BID   sodium bicarbonate  650 mg Oral BID   zolpidem  5 mg Oral QHS   Continuous Infusions:  cefoTEtan (CEFOTAN) IV     lactated ringers      PRN Meds:.bisacodyl, ipratropium-albuterol, lactated ringers, ondansetron (ZOFRAN) IV    Subjective:   Cindy Holmes was seen and examined today.  No sob, chest pain.   Objective:   Vitals:   03/05/23 2127 03/06/23 0529 03/06/23 0835 03/06/23 0936  BP: (!) 100/53 (!) 98/48 (!) 102/48 (!) 119/52  Pulse: 75 79 80   Resp: 18 18 17    Temp: 98.1 F (36.7 C) 97.7 F (36.5 C) 98.2 F (36.8 C)   TempSrc: Oral Oral    SpO2: 100% 99% 99%   Weight:      Height:        Intake/Output Summary (Last 24 hours) at 03/06/2023 1043 Last data filed at 03/06/2023 0844 Gross per 24 hour  Intake 1367.35 ml  Output 0 ml  Net 1367.35 ml   Filed Weights   03/02/23 1115  Weight: 104.3 kg     Exam General exam: Appears calm and comfortable  Respiratory system: diminished air entry at bases, on RA. Scattered wheezing posteriorly.  Cardiovascular system: S1 & S2 heard, RRR.  Gastrointestinal system: Abdomen is nondistended, soft and nontender.  Central nervous system: Alert and oriented.  Hard of hearing. No focal neurological deficits. Extremities: bilateral leg edema.  Skin: No rashes,  Psychiatry:Mood & affect appropriate.        Data Reviewed:  I have personally reviewed following labs and imaging studies   CBC Lab Results   Component Value Date   WBC 4.4 03/06/2023   RBC 2.94 (L) 03/06/2023   HGB  8.9 (L) 03/06/2023   HCT 27.6 (L) 03/06/2023   MCV 93.9 03/06/2023   MCH 30.3 03/06/2023   PLT 241 03/06/2023   MCHC 32.2 03/06/2023   RDW 17.9 (H) 03/06/2023   LYMPHSABS 0.8 03/06/2023   MONOABS 0.2 03/06/2023   EOSABS 0.0 03/06/2023   BASOSABS 0.0 03/06/2023     Last metabolic panel Lab Results  Component Value Date   NA 131 (L) 03/06/2023   K 4.0 03/06/2023   CL 103 03/06/2023   CO2 21 (L) 03/06/2023   BUN 22 03/06/2023   CREATININE 1.41 (H) 03/06/2023   GLUCOSE 131 (H) 03/06/2023   GFRNONAA 40 (L) 03/06/2023   GFRAA >60 12/11/2018   CALCIUM 7.6 (L) 03/06/2023   PROT 5.6 (L) 03/02/2023   ALBUMIN 2.6 (L) 03/02/2023   LABGLOB 1.9 08/13/2022   AGRATIO 2.3 (H) 08/13/2022   BILITOT 0.8 03/02/2023   ALKPHOS 65 03/02/2023   AST 10 (L) 03/02/2023   ALT 9 03/02/2023   ANIONGAP 7 03/06/2023    CBG (last 3)  No results for input(s): "GLUCAP" in the last 72 hours.    Coagulation Profile: No results for input(s): "INR", "PROTIME" in the last 168 hours.   Radiology Studies: DG CHEST PORT 1 VIEW  Result Date: 03/05/2023 CLINICAL DATA:  Wheezing EXAM: PORTABLE CHEST 1 VIEW COMPARISON:  02/02/2023 FINDINGS: Cardiac shadow is within normal limits. Mild aortic calcifications are seen. The lungs are well aerated bilaterally. Mild central vascular congestion is again noted. No significant edema is seen. No focal infiltrate is noted. No bony abnormality is seen. IMPRESSION: Mild vascular congestion stable from the prior exam. Electronically Signed   By: Alcide Clever M.D.   On: 03/05/2023 19:05   US RENAL  Result Date: 03/04/2023 CLINICAL DATA:  Renal lesion. EXAM: RENAL / URINARY TRACT ULTRASOUND COMPLETE COMPARISON:  Noncontrast CT 03/03/2023 and older CTs FINDINGS: Right Kidney: Renal measurements: 9.4 x 4.6 x 5.4 cm = volume: 122.3 mL. Echogenicity within normal limits. No mass or hydronephrosis  visualized. Left Kidney: Renal measurements: 9.1 x 5.4 x 5.2 cm = volume: 132.5 mL. Mild left-sided renal atrophy, parenchymal loss. No collecting system dilatation or perinephric fluid. Bladder: Bladder is underdistended.  Grossly preserved contour. Other: None. IMPRESSION: No collecting system dilatation. Mild global atrophy of the left kidney. Of note no specific renal lesion identified. Overall, recommend either MRI evaluation or follow up CT scan in 6 months Electronically Signed   By: Karen Kays M.D.   On: 03/04/2023 18:39       Kathlen Mody M.D. [facility] 03/06/2023, 10:43 AM  Available via Epic secure chat 7am-7pm After 7 pm, please refer to night coverage provider listed on amion.

## 2023-03-07 ENCOUNTER — Encounter (HOSPITAL_COMMUNITY): Payer: Self-pay | Admitting: Family Medicine

## 2023-03-07 DIAGNOSIS — E782 Mixed hyperlipidemia: Secondary | ICD-10-CM | POA: Diagnosis not present

## 2023-03-07 DIAGNOSIS — I480 Paroxysmal atrial fibrillation: Secondary | ICD-10-CM | POA: Diagnosis not present

## 2023-03-07 DIAGNOSIS — Z01810 Encounter for preprocedural cardiovascular examination: Secondary | ICD-10-CM | POA: Diagnosis not present

## 2023-03-07 DIAGNOSIS — I10 Essential (primary) hypertension: Secondary | ICD-10-CM | POA: Diagnosis not present

## 2023-03-07 DIAGNOSIS — N179 Acute kidney failure, unspecified: Secondary | ICD-10-CM | POA: Diagnosis not present

## 2023-03-07 DIAGNOSIS — R197 Diarrhea, unspecified: Secondary | ICD-10-CM | POA: Diagnosis not present

## 2023-03-07 LAB — BASIC METABOLIC PANEL
Anion gap: 9 (ref 5–15)
BUN: 21 mg/dL (ref 8–23)
CO2: 19 mmol/L — ABNORMAL LOW (ref 22–32)
Calcium: 7.7 mg/dL — ABNORMAL LOW (ref 8.9–10.3)
Chloride: 103 mmol/L (ref 98–111)
Creatinine, Ser: 1.46 mg/dL — ABNORMAL HIGH (ref 0.44–1.00)
GFR, Estimated: 38 mL/min — ABNORMAL LOW (ref >60)
Glucose, Bld: 93 mg/dL (ref 70–99)
Potassium: 3.5 mmol/L (ref 3.5–5.1)
Sodium: 131 mmol/L — ABNORMAL LOW (ref 135–145)

## 2023-03-07 LAB — SURGICAL PCR SCREEN
MRSA, PCR: NEGATIVE
Staphylococcus aureus: NEGATIVE

## 2023-03-07 LAB — HEMOGLOBIN A1C
Hgb A1c MFr Bld: 5.3 % (ref 4.8–5.6)
Mean Plasma Glucose: 105.41 mg/dL

## 2023-03-07 MED ORDER — ACETAMINOPHEN 500 MG PO TABS
1000.0000 mg | ORAL_TABLET | ORAL | Status: AC
  Administered 2023-03-08: 1000 mg via ORAL
  Filled 2023-03-07: qty 2

## 2023-03-07 NOTE — Progress Notes (Signed)
3 Days Post-Op  Subjective: Patient has been having symptoms for about 4 months.  Still with some intermittent nausea.  Tolerating some liquids, but not always.  Has been marked for ostomies   Objective: Vital signs in last 24 hours: Temp:  [97.6 F (36.4 C)-98.8 F (37.1 C)] 98.8 F (37.1 C) (08/26 0853) Pulse Rate:  [80-94] 94 (08/26 0853) Resp:  [16-18] 17 (08/26 0853) BP: (106-124)/(44-56) 108/48 (08/26 0853) SpO2:  [97 %-100 %] 97 % (08/26 0853) Last BM Date : 03/06/23  Intake/Output from previous day: 08/25 0701 - 08/26 0700 In: 420 [P.O.:420] Out: 0  Intake/Output this shift: No intake/output data recorded.  PE: Abd: soft, morbidly obese, +BS, tenderness on right and left side of abdomen, but no peritonitis or guarding, no distention  Lab Results:  Recent Labs    03/05/23 0459 03/06/23 0624  WBC 6.3 4.4  HGB 8.4* 8.9*  HCT 26.4* 27.6*  PLT 220 241   BMET Recent Labs    03/06/23 0624 03/07/23 0459  NA 131* 131*  K 4.0 3.5  CL 103 103  CO2 21* 19*  GLUCOSE 131* 93  BUN 22 21  CREATININE 1.41* 1.46*  CALCIUM 7.6* 7.7*   PT/INR No results for input(s): "LABPROT", "INR" in the last 72 hours. CMP     Component Value Date/Time   NA 131 (L) 03/07/2023 0459   NA 141 08/13/2022 1304   K 3.5 03/07/2023 0459   CL 103 03/07/2023 0459   CO2 19 (L) 03/07/2023 0459   GLUCOSE 93 03/07/2023 0459   BUN 21 03/07/2023 0459   BUN 19 08/13/2022 1304   CREATININE 1.46 (H) 03/07/2023 0459   CREATININE 0.68 11/13/2015 1047   CALCIUM 7.7 (L) 03/07/2023 0459   PROT 4.9 (L) 03/06/2023 0624   PROT 6.3 08/13/2022 1304   ALBUMIN 2.2 (L) 03/06/2023 0624   ALBUMIN 4.4 08/13/2022 1304   AST 12 (L) 03/06/2023 0624   ALT 9 03/06/2023 0624   ALKPHOS 52 03/06/2023 0624   BILITOT 0.5 03/06/2023 0624   BILITOT 0.5 08/13/2022 1304   GFRNONAA 38 (L) 03/07/2023 0459   GFRAA >60 12/11/2018 1222   Lipase     Component Value Date/Time   LIPASE 50 03/02/2023 1119        Studies/Results: ECHOCARDIOGRAM COMPLETE  Result Date: 03/06/2023    ECHOCARDIOGRAM REPORT   Patient Name:   FALINE LEIDEL [facility] Date of Exam: 03/06/2023 Medical Rec #:  [id number]       Height:       63.0 in Accession #:    [id number]      Weight:       229.9 lb Date of Birth:  06-Jan-1951      BSA:          2.052 m Patient Age:    71 years        BP:           119/52 mmHg Patient Gender: F               HR:           79 bpm. Exam Location:  Inpatient Procedure: 2D Echo, Color Doppler and Cardiac Doppler Indications:    fluid overload  History:        Patient has prior history of Echocardiogram examinations, most                 recent 10/27/2020. CAD; Risk Factors:Hypertension and  Dyslipidemia.  Sonographer:    Delcie Roch RDCS Referring Phys: Nilda Calamity AKULA IMPRESSIONS  1. Left ventricular ejection fraction, by estimation, is 65 to 70%. The left ventricle has normal function. The left ventricle has no regional wall motion abnormalities. Left ventricular diastolic parameters are consistent with Grade II diastolic dysfunction (pseudonormalization).  2. Right ventricular systolic function is normal. The right ventricular size is dilated. There is severely elevated pulmonary artery systolic pressure. The estimated right ventricular systolic pressure is 61.5 mmHg.  3. Left atrial size was mildly dilated.  4. The mitral valve is normal in structure. Mild to moderate mitral valve regurgitation.  5. Multiple jets. Tricuspid valve regurgitation is mild to moderate.  6. The aortic valve is tricuspid. Aortic valve regurgitation is not visualized. No aortic stenosis is present.  7. The inferior vena cava is dilated in size with <50% respiratory variability, suggesting right atrial pressure of 15 mmHg. Comparison(s): Prior images reviewed side by side. RV is dilated compared to prior on 4 chamber live views. RVSP has increased. FINDINGS  Left Ventricle: Left ventricular ejection fraction, by  estimation, is 65 to 70%. The left ventricle has normal function. The left ventricle has no regional wall motion abnormalities. The left ventricular internal cavity size was normal in size. There is  no left ventricular hypertrophy. Left ventricular diastolic parameters are consistent with Grade II diastolic dysfunction (pseudonormalization). Right Ventricle: The right ventricular size is dilated. No increase in right ventricular wall thickness. Right ventricular systolic function is normal. There is severely elevated pulmonary artery systolic pressure. The tricuspid regurgitant velocity is 3.41 m/s, and with an assumed right atrial pressure of 15 mmHg, the estimated right ventricular systolic pressure is 61.5 mmHg. Left Atrium: Left atrial size was mildly dilated. Right Atrium: Right atrial size was normal in size. Pericardium: Trivial pericardial effusion is present. The pericardial effusion is circumferential. Presence of epicardial fat layer. Mitral Valve: The mitral valve is normal in structure. Mild to moderate mitral valve regurgitation. Tricuspid Valve: Multiple jets. The tricuspid valve is normal in structure. Tricuspid valve regurgitation is mild to moderate. Aortic Valve: The aortic valve is tricuspid. Aortic valve regurgitation is not visualized. No aortic stenosis is present. Pulmonic Valve: The pulmonic valve was normal in structure. Pulmonic valve regurgitation is trivial. No evidence of pulmonic stenosis. Aorta: The aortic root, ascending aorta and aortic arch are all structurally normal, with no evidence of dilitation or obstruction. Venous: The inferior vena cava is dilated in size with less than 50% respiratory variability, suggesting right atrial pressure of 15 mmHg. IAS/Shunts: The atrial septum is grossly normal.  LEFT VENTRICLE PLAX 2D LVIDd:         5.20 cm   Diastology LVIDs:         3.00 cm   LV e' medial:    6.85 cm/s LV PW:         0.80 cm   LV E/e' medial:  21.6 LV IVS:        0.90 cm    LV e' lateral:   7.62 cm/s LVOT diam:     1.80 cm   LV E/e' lateral: 19.4 LV SV:         61 LV SV Index:   30 LVOT Area:     2.54 cm  RIGHT VENTRICLE             IVC RV Basal diam:  2.60 cm     IVC diam: 2.40 cm RV S prime:  11.70 cm/s TAPSE (M-mode): 1.9 cm LEFT ATRIUM             Index        RIGHT ATRIUM           Index LA diam:        4.40 cm 2.14 cm/m   RA Area:     13.70 cm LA Vol (A2C):   75.2 ml 36.65 ml/m  RA Volume:   30.00 ml  14.62 ml/m LA Vol (A4C):   62.0 ml 30.22 ml/m LA Biplane Vol: 69.1 ml 33.68 ml/m  AORTIC VALVE LVOT Vmax:   128.00 cm/s LVOT Vmean:  83.400 cm/s LVOT VTI:    0.239 m  AORTA Ao Root diam: 3.00 cm Ao Asc diam:  3.40 cm MITRAL VALVE                  TRICUSPID VALVE MV Area (PHT): 4.15 cm       TR Peak grad:   46.5 mmHg MV Decel Time: 183 msec       TR Vmax:        341.00 cm/s MR Peak grad:    82.8 mmHg MR Mean grad:    59.0 mmHg    SHUNTS MR Vmax:         455.00 cm/s  Systemic VTI:  0.24 m MR Vmean:        364.0 cm/s   Systemic Diam: 1.80 cm MR PISA:         0.57 cm MR PISA Eff ROA: 5 mm MR PISA Radius:  0.30 cm MV E velocity: 148.00 cm/s MV A velocity: 77.10 cm/s MV E/A ratio:  1.92 Riley Lam MD Electronically signed by Riley Lam MD Signature Date/Time: 03/06/2023/1:55:10 PM    Final    DG CHEST PORT 1 VIEW  Result Date: 03/05/2023 CLINICAL DATA:  Wheezing EXAM: PORTABLE CHEST 1 VIEW COMPARISON:  02/02/2023 FINDINGS: Cardiac shadow is within normal limits. Mild aortic calcifications are seen. The lungs are well aerated bilaterally. Mild central vascular congestion is again noted. No significant edema is seen. No focal infiltrate is noted. No bony abnormality is seen. IMPRESSION: Mild vascular congestion stable from the prior exam. Electronically Signed   By: Alcide Clever M.D.   On: 03/05/2023 19:05    Anti-infectives: Anti-infectives (From admission, onward)    Start     Dose/Rate Route Frequency Ordered Stop   03/07/23 0600  cefoTEtan  (CEFOTAN) 2 g in sodium chloride 0.9 % 100 mL IVPB        2 g 200 mL/hr over 30 Minutes Intravenous On call to O.R. 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 -may have CLD today and NPO p MN -discussed with patient and all 3 children at the bedside.  -cards clearance and risk stratification pending.  FEN - CLD, NPO p MN VTE - Lovenox ID - cefotetan on call to OR  CAD - eliquis on Hold CHF HTN HLD RA on Plaquenil and methotrexate.  Currently on hold Morbid obesity  I reviewed Consultant cardiology notes, hospitalist notes, last 24 h vitals and pain scores, last 48 h intake and output, last 24 h labs and trends, and last 24 h imaging results.   LOS: 5 days    Letha Cape , [facility] 03/07/2023, 1:00 PM Please see Amion for pager number during day hours 7:00am-4:30pm or 7:00am -11:30am on weekends

## 2023-03-07 NOTE — Progress Notes (Signed)
PT Cancellation Note  Patient Details Name: Cindy Holmes MRN: [id number] DOB: 14-Oct-1950   Cancelled Treatment:    Reason Eval/Treat Not Completed: Patient declined, no reason specified. Pt declines PT intervention at this time, reports she may mobilize later but not at this time. PT will follow up as time allows.   Arlyss Gandy 03/07/2023, 1:04 PM

## 2023-03-07 NOTE — Consult Note (Addendum)
Cardiology Consultation   Patient ID: ZANITA REDBIRD MRN: [id number]; DOB: [birth year]  Admit date: 03/02/2023 Date of Consult: 03/07/2023  PCP:  [name], MD   [facility] Providers Cardiologist:  Marjo Bicker, MD        Patient Profile:   Cindy Holmes is a 72 y.o. female with a hx of CAD with hx of BMS to LAD and PTCA to D1 in 2001, repeat PTCA to D1 in 2002, HFpEF, RA, lupus on chronic immunosuppression, HTN, prior CVA, a fib, HLD, HTN who is being seen 03/07/2023 for the evaluation of pre-op eval at the request of Dr. Blake Divine and Dr. Michaell Cowing for segmental colonic resection of the stricture.Marland Kitchen  History of Present Illness:   Ms. Bidgood with above hx and last OV with Dr. Shari Prows 08/2022 and will change care to Dr. Jenene Slicker in [place].  CAD with prior BMS to LAD and PTCA to D1.  Last cardiac cath 03/12/16 with mod instent restenosis of pLAD stent FFR 0.84, mild disease involving ostial D1 and mid/distal LAD stent, normal LV contraction.  EF 55-65%.   Last myoview stress test 03/30/21 that was normal.   Previous echo 10/27/20 with EF 60-65% mild concentric LVH, G2DD.  Mild MR, mild TR.  Chronic HFpEF.  On lasix 40. She is wheelchair bound.   She had new onset AF 10/2020 on admit with diverticulitis, brief episode.and placed on BB and eliquis.    14 day monitor with SVT with longest 16 sec.at 120 HR and for 13 beats the fastest of 174.   No afib, VT   --no recent atrial fib. On metoprolol 50 AM and 25 pm.  And prn.  On Eliquis.     Pt presented to ER 03/02/23 with N/V/D for 3 weeks.  She had been hospitalized 7/24 to 7/27 for diarrhea and AKI.   On that admit. Stool PCR positive for enteropathogenic E. Coli and she was treated with 5 days course of Azithromcyin.  Her BP was 101/45 WBC 10.7 and CR 1.83 from 0.96  she was very weak.  IV fluids given and ACE and spironolactone held. No EKG this admit, was in SR in July.    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  She has been seen by surgery and will require ex lap and resection with colostomy.  Eliquis held and IV heparin started.   Last dose of eliquis 03/03/23 at 0800 -given a dose of VTE lovenox today. No heparin.     Echo 03/06/23 with EF 65-70%, no RWMA, G2 DD, RV function is normal, RV size is dilated  There is severely elevated pulmonary artery systolic  pressure. The estimated right ventricular systolic pressure is 61.5 mmHg. (In 2022 normal pulmonary artery systolic pressure at 31 mmHg) Mild to mod MR, TR is mild to mod and multiple jets.  Labs today Na 131  k+ 3.5 BUN 21 Cr 1.46 up from 1.41 yesterday (pk this admit 1.83 on 03/02/23)  TSH 1.916  Mg+ 2.0  yesterday WBC 4.4  Hgb 8.9 plts 241  BP 108/48 P 94 R 17 T 98.8    Past Medical History:  Diagnosis Date   CAD (coronary artery disease)    a. PTCA LAD 2001/cath 2002-prox and mid LAD stents patent, PTCA ostium Dx 1 b. cath 03/2016: patent LAD stent with less than 40% in-stent restenosis, 10-30%  stenosis of D1 and distal LAD with continued medical management recommended. 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    Past Surgical History:  Procedure Laterality Date   BIOPSY  03/04/2023    Procedure: BIOPSY;  Surgeon: Jeani Hawking, MD;  Location: [facility] ENDOSCOPY;  Service: Gastroenterology;;   CARDIAC CATHETERIZATION     with stent placement in 2001   CARDIAC CATHETERIZATION N/A 03/12/2016   Procedure: Left Heart Cath and Coronary Angiography;  Surgeon: Yvonne Kendall, MD;  Location: [facility] INVASIVE CV LAB;  Service: Cardiovascular;  Laterality: N/A;   CARDIAC CATHETERIZATION N/A 03/12/2016   Procedure: Intravascular Pressure Wire/FFR Study;  Surgeon: Yvonne Kendall, MD;  Location: [facility] INVASIVE CV LAB;  Service: Cardiovascular;  Laterality: N/A;   CHOLECYSTECTOMY     COLONOSCOPY WITH PROPOFOL N/A 03/04/2023   Procedure: COLONOSCOPY WITH PROPOFOL;  Surgeon: Jeani Hawking, MD;  Location[facility] ENDOSCOPY;  Service: Gastroenterology;  Laterality: N/A;   CORONARY ANGIOPLASTY     2002   ESOPHAGOGASTRODUODENOSCOPY N/A 12/03/2016   Procedure: ESOPHAGOGASTRODUODENOSCOPY (EGD);  Surgeon: Jeani Hawking, MD;  Location: [facility] ENDOSCOPY;  Service: Endoscopy;  Laterality: N/A;   laproscopic adjustable gastric  banding     with APS standard system.    OPEN REDUCTION INTERNAL FIXATION (ORIF) DISTAL RADIAL FRACTURE Right 05/15/2018   Procedure: OPEN REDUCTION INTERNAL FIXATION (ORIF) DISTAL RADIAL FRACTURE;  Surgeon: Roby Lofts, MD;  Location: [facility] OR;  Service: Orthopedics;  Laterality: Right;   ORIF ANKLE FRACTURE Right 05/15/2018   Procedure: OPEN REDUCTION INTERNAL FIXATION (ORIF) ANKLE FRACTURE;  Surgeon: Roby Lofts, MD;  Location: [facility] OR;  Service: Orthopedics;  Laterality: Right;   ORIF TIBIA PLATEAU Right 05/15/2018   Procedure: OPEN REDUCTION INTERNAL FIXATION (ORIF) TIBIAL PLATEAU;  Surgeon: Roby Lofts, MD;  Location: [facility] OR;  Service: Orthopedics;  Laterality: Right;   Pt has 3 stents     sept 2001   SHOULDER ARTHROSCOPY WITH SUBACROMIAL DECOMPRESSION, ROTATOR CUFF REPAIR AND BICEP TENDON REPAIR Right 11/16/2013   Procedure: RIGHT SHOULDER ARTHROSCOPY, EXTENSIVE DEBRIDEMENT, ROTATOR CUFF REPAIR  ;  Surgeon: Eulas Post, MD;  Location: [facility];  Service: Orthopedics;  Laterality: Right;   TUBAL LIGATION       Home Medications:  Prior to Admission medications   Medication Sig Start Date End Date Taking? Authorizing Provider  acetaminophen (TYLENOL) 500 MG tablet Take 1,000 mg by mouth every 6 (six) hours as needed for headache (pain).   Yes [provider]  acidophilus (RISAQUAD) CAPS capsule Take 2 capsules by mouth daily. 02/05/23  Yes Joseph Art, DO  allopurinol (ZYLOPRIM) 100 MG tablet Take 400 mg by mouth at bedtime.   Yes [provider]  apixaban Everlene Balls) 5  MG TABS tablet Take 1 tablet (5 mg total) by mouth 2 (two) times daily. 12/10/20  Yes Meriam Sprague, MD  aspirin EC 81 MG tablet Take 81 mg by mouth at bedtime. Swallow whole.   Yes [provider]  atorvastatin (LIPITOR) 40 MG tablet Take 2 tablets (80 mg total) by mouth daily 12/10/20  Yes Georgie Chard D, NP  diphenoxylate-atropine (LOMOTIL) 2.5-0.025 MG tablet Take 1 tablet by mouth 4 (four) times daily as needed for diarrhea or loose stools. 02/22/23  Yes [provider]  ezetimibe (ZETIA) 10 MG tablet Take 1 tablet (10 mg total) by mouth daily. 12/10/20  Yes Georgie Chard D, NP  folic acid (FOLVITE) 1 MG tablet Take 1 mg by mouth at bedtime. 03/05/14  Yes [provider]  furosemide (LASIX) 20 MG tablet Take 3 tablets (60 mg total) by mouth in the morning, then take 2 tablets (40 mg total) by mouth in the afternoon, everyday. 02/07/23  Yes Joseph Art, DO  hydroxychloroquine (PLAQUENIL) 200 MG tablet Take 200 mg by mouth at bedtime. 09/03/20  Yes [provider]  loperamide (IMODIUM) 2 MG capsule Take 1 capsule (2 mg total) by mouth as needed for diarrhea or loose stools. 02/05/23  Yes Joseph Art, DO  methotrexate (RHEUMATREX) 2.5 MG tablet Take 10 mg by mouth every Sunday. 08/08/20  Yes [provider]  metoprolol tartrate  (LOPRESSOR) 25 MG tablet TAKE 1 TABLET BY MOUTH TWICE  DAILY ALONG WITH 50MG  TABLET FOR A TOTAL OF 75MG  TWICE DAILY Patient taking differently: Take 25 mg by mouth 2 (two) times daily. Take a total of 75 mg twice daily 01/21/23  Yes Weaver, Scott T, PA-C  metoprolol tartrate (LOPRESSOR) 50 MG tablet TAKE 1 TABLET BY MOUTH TWICE  DAILY WITH 25MG  TABLET FOR A  TOTAL DOSE OF 75MG  TWICE DAILY Patient taking differently: Take 50 mg by mouth 2 (two) times daily. Take for a total dose of 75 mg twice daily. 01/21/23  Yes Weaver, Scott T, PA-C  Omega-3 Fatty Acids (FISH OIL) 1000 MG CAPS Take 1,000 mg by mouth at bedtime.   Yes [provider]  ondansetron (ZOFRAN) 4 MG tablet Take 4 mg by mouth every 8 (eight) hours as needed for vomiting or nausea. 02/11/23  Yes [provider]  potassium chloride SA (KLOR-CON M) 20 MEQ tablet Take 20 mEq by mouth daily. 01/21/23  Yes [provider]  ramipril (ALTACE) 2.5 MG capsule Take 1 capsule (2.5 mg total) by mouth daily. 02/09/23  Yes Joseph Art, DO  spironolactone (ALDACTONE) 25 MG tablet Take 1 tablet (25 mg total) by mouth daily. 02/11/23  Yes Vann, Jessica U, DO  zolpidem (AMBIEN) 5 MG tablet Take 5 mg by mouth at bedtime. 10/13/20  Yes [provider]  azithromycin (ZITHROMAX) 500 MG tablet Take 1 tablet (500 mg total) by mouth daily. Patient not taking: Reported on 03/02/2023 02/05/23   Joseph Art, DO  ciprofloxacin (CIPRO) 500 MG tablet Take 500 mg by mouth 2 (two) times daily. Patient not taking: Reported on 03/02/2023 02/11/23   [provider]    Inpatient Medications: Scheduled Meds:  allopurinol  400 mg Oral QHS   alvimopan  12 mg Oral On Call to OR   aspirin EC  81 mg Oral QHS   atorvastatin  80 mg Oral Daily   enoxaparin (LOVENOX) injection  50 mg Subcutaneous Q24H   ezetimibe  10 mg Oral Daily   feeding supplement  296 mL Oral Once   furosemide  20 mg Oral BID   gabapentin  200 mg Oral On Call to OR    metroNIDAZOLE  1,000 mg Oral 3 times per day   midodrine  10 mg Oral TID WC   mupirocin ointment  1 Application Nasal BID   polyethylene glycol  17 g Oral BID   sodium bicarbonate  650 mg Oral BID   zolpidem  5 mg Oral QHS   Continuous Infusions:  sodium chloride 50 mL/hr at 03/07/23 0547   cefoTEtan (CEFOTAN) IV     lactated ringers     PRN Meds: bisacodyl, ipratropium-albuterol, lactated ringers, ondansetron (ZOFRAN) IV  Allergies:    Allergies  Allergen Reactions   Codeine Phosphate Shortness Of Breath   Morphine And Codeine Nausea And Vomiting   Amoxicillin Nausea And Vomiting   Penicillins Nausea And Vomiting    Has patient had a PCN reaction causing immediate rash, facial/tongue/throat swelling, SOB or lightheadedness with hypotension:YES Has patient had a PCN reaction causing severe rash involving mucus membranes or skin necrosis: NO Has patient had a PCN reaction that required hospitalization NO Has patient had a PCN reaction occurring within the last 10 years: NO If all of the above answers are "NO", then may proceed with Cephalosporin use.   Methotrexate Nausea Only   Lidocaine Rash    Reaction to lidocaine patch    Social History:   Social History   Socioeconomic History   Marital status: Married    Spouse name: Not on file   Number of children: Not on file   Years of education: Not on file   Highest education level: Not on file  Occupational History   Not on file  Tobacco Use   Smoking status: Never   Smokeless tobacco: Never  Vaping Use   Vaping status: Never Used  Substance and Sexual Activity   Alcohol use: No    Alcohol/week: 0.0 standard drinks of alcohol   Drug use: No   Sexual activity: Never  Other Topics Concern   Not on file  Social History Narrative   Not on file   Social Determinants of Health   Financial Resource Strain: Not on file  Food Insecurity: No Food Insecurity (03/05/2023)   Hunger Vital Sign    Worried About Running Out  of Food in the Last Year: Never true    Ran Out of Food in the Last Year: Never true  Transportation Needs: No Transportation Needs (03/05/2023)   PRAPARE - Administrator, Civil Service (Medical): No    Lack of Transportation (Non-Medical): No  Physical Activity: Not on file  Stress: Not on file  Social Connections: Not on file  Intimate Partner Violence: Not At Risk (03/05/2023)   Humiliation, Afraid, Rape, and Kick questionnaire    Fear of Current or Ex-Partner: No    Emotionally Abused: No    Physically Abused: No    Sexually Abused: No    Family History:    Family History  Problem Relation Age of Onset   Heart attack Mother    Heart disease Mother    Arthritis/Rheumatoid Father    Cirrhosis Father        hepatic cirrhosis   Breast cancer Sister 53   Diabetes Sister    Heart disease Maternal Aunt    Heart attack Maternal Aunt    Throat cancer Maternal Aunt    Heart attack Maternal Uncle    Colon cancer Maternal Grandmother  Throat cancer Maternal Grandfather    Diabetes Brother    Heart disease Brother    Heart disease Brother    Cancer Other        family history   Heart attack Other        family history     ROS:  Please see the history of present illness.  General:no colds or fevers, no weight changes Skin:no rashes or ulcers HEENT:no blurred vision, no congestion CV:see HPI PUL:see HPI GI:++ diarrhea no constipation or melena, no indigestion GU:no hematuria, no dysuria MS:no joint pain, no claudication Neuro:no syncope, no lightheadedness Endo:no diabetes, no thyroid disease  All other ROS reviewed and negative.     Physical Exam/Data:   Vitals:   03/06/23 1727 03/06/23 2143 03/07/23 0525 03/07/23 0853  BP: (!) 109/56 (!) 124/54 (!) 117/47 (!) 108/48  Pulse: 93 80 88 94  Resp: 16 18  17   Temp: 97.8 F (36.6 C) 97.6 F (36.4 C) 98.6 F (37 C) 98.8 F (37.1 C)  TempSrc:  Oral    SpO2: 99% 98% 100% 97%  Weight:      Height:         Intake/Output Summary (Last 24 hours) at 03/07/2023 1253 Last data filed at 03/07/2023 1136 Gross per 24 hour  Intake 180 ml  Output 0 ml  Net 180 ml      03/02/2023   11:15 AM 02/02/2023    1:56 PM 08/25/2022   10:10 AM  Last 3 Weights  Weight (lbs) 229 lb 15 oz 230 lb 225 lb  Weight (kg) 104.3 kg 104.327 kg 102.059 kg     Body mass index is 40.73 kg/m.  General:  Well nourished, well developed, in no acute distress, very hard of hearing HEENT: normal Neck: no JVD Vascular: No carotid bruits; Distal pulses 2+ bilaterally Cardiac:  normal S1, S2; RRR; no murmur gallup rub or click Lungs:  clear to auscultation bilaterally, no wheezing, rhonchi or rales  Abd: soft, nontender, no hepatomegaly  Ext: 2-3+ edema to hips Musculoskeletal:  No deformities, BUE and BLE strength normal and equal Skin: warm and dry  Neuro:  alert and oriented X 3 MAE follows commands, no focal abnormalities noted Psych:  Normal affect   EKG:  The EKG was personally reviewed and demonstrates:  EKG today with SR with PR 110 ms no Acute ST changes, some ST and T wave abnormality but improved from 02/02/23  Telemetry:  Telemetry was personally reviewed and demonstrates:  on 03/03/23 SVT unsure if atrial fib.  Will defer to MD  Relevant CV Studies:  ECHO 03/06/23 IMPRESSIONS     1. Left ventricular ejection fraction, by estimation, is 65 to 70%. The  left ventricle has normal function. The left ventricle has no regional  wall motion abnormalities. Left ventricular diastolic parameters are  consistent with Grade II diastolic  dysfunction (pseudonormalization).   2. Right ventricular systolic function is normal. The right ventricular  size is dilated. There is severely elevated pulmonary artery systolic  pressure. The estimated right ventricular systolic pressure is 61.5 mmHg.   3. Left atrial size was mildly dilated.   4. The mitral valve is normal in structure. Mild to moderate mitral valve   regurgitation.   5. Multiple jets. Tricuspid valve regurgitation is mild to moderate.   6. The aortic valve is tricuspid. Aortic valve regurgitation is not  visualized. No aortic stenosis is present.   7. The inferior vena cava is dilated in size with <  50% respiratory  variability, suggesting right atrial pressure of 15 mmHg.   Comparison(s): Prior images reviewed side by side. RV is dilated compared  to prior on 4 chamber live views. RVSP has increased.   FINDINGS   Left Ventricle: Left ventricular ejection fraction, by estimation, is 65  to 70%. The left ventricle has normal function. The left ventricle has no  regional wall motion abnormalities. The left ventricular internal cavity  size was normal in size. There is   no left ventricular hypertrophy. Left ventricular diastolic parameters  are consistent with Grade II diastolic dysfunction (pseudonormalization).   Right Ventricle: The right ventricular size is dilated. No increase in  right ventricular wall thickness. Right ventricular systolic function is  normal. There is severely elevated pulmonary artery systolic pressure. The  tricuspid regurgitant velocity is  3.41 m/s, and with an assumed right atrial pressure of 15 mmHg, the  estimated right ventricular systolic pressure is 61.5 mmHg.   Left Atrium: Left atrial size was mildly dilated.   Right Atrium: Right atrial size was normal in size.   Pericardium: Trivial pericardial effusion is present. The pericardial  effusion is circumferential. Presence of epicardial fat layer.   Mitral Valve: The mitral valve is normal in structure. Mild to moderate  mitral valve regurgitation.   Tricuspid Valve: Multiple jets. The tricuspid valve is normal in  structure. Tricuspid valve regurgitation is mild to moderate.   Aortic Valve: The aortic valve is tricuspid. Aortic valve regurgitation is  not visualized. No aortic stenosis is present.   Pulmonic Valve: The pulmonic valve was  normal in structure. Pulmonic valve  regurgitation is trivial. No evidence of pulmonic stenosis.   Aorta: The aortic root, ascending aorta and aortic arch are all  structurally normal, with no evidence of dilitation or obstruction.   Venous: The inferior vena cava is dilated in size with less than 50%  respiratory variability, suggesting right atrial pressure of 15 mmHg.   IAS/Shunts: The atrial septum is grossly normal.      Nuc study 2022   The study is normal. The study is low risk.   No ST deviation was noted.   Left ventricular function is normal. Nuclear stress EF: 78 %. The left ventricular ejection fraction is hyperdynamic (>65%).   Prior study available for comparison from 05/22/2019. No changes compared to prior study.  No evidence of ischemia or previous myocardial infarction.   Carotid US 01/19/21 and 01/05/23  Bilateral ICA 1-39   all stable   Incidental findings: Enlarged cervical lymph node anterior to the left  proximal/mid segment of the internal carotid artery, measuring 1.5 x .96 x  .88 cm.    LONG TERM MONITOR 01/02/2021 Narrative  Patch wear time was 12 days and 11 hours  Predominant rhythm was NSR with average HR 70bpm (ranging from 50-174bpm)  There were 368 episodes of SVT with the longest lasting 16.2 seconds at average rate 120bpm; fastest was 13 beats at max rate of 174bpm  Occasional SVE (2.3%), rare PVCs (<1%)  No AFib, VT or significant pauses   Echocardiogram 10/27/20 EF 60-65, no RWMA, mild LVH, GRII DD, normal RVSF, RVSP 31.1, mild LAE, mild MR, trivial AI  Laboratory Data:  High Sensitivity Troponin:  No results for input(s): "TROPONINIHS" in the last 720 hours.   Chemistry Recent Labs  Lab 03/03/23 0814 03/04/23 0449 03/05/23 0459 03/05/23 1925 03/06/23 0624 03/07/23 0459  NA 132*   < > 131*  --  131* 131*  K  3.2*   < > 2.7* 3.2* 4.0 3.5  CL 105   < > 102  --  103 103  CO2 16*   < > 19*  --  21* 19*  GLUCOSE 111*   < > 89  --   131* 93  BUN 30*   < > 21  --  22 21  CREATININE 1.24*   < > 1.21*  --  1.41* 1.46*  CALCIUM 7.7*   < > 7.6*  --  7.6* 7.7*  MG 1.5*  --  1.9  --  2.0  --   GFRNONAA 47*   < > 48*  --  40* 38*  ANIONGAP 11   < > 10  --  7 9   < > = values in this interval not displayed.    Recent Labs  Lab 03/02/23 1119 03/06/23 0624  PROT 5.6* 4.9*  ALBUMIN 2.6* 2.2*  AST 10* 12*  ALT 9 9  ALKPHOS 65 52  BILITOT 0.8 0.5   Lipids No results for input(s): "CHOL", "TRIG", "HDL", "LABVLDL", "LDLCALC", "CHOLHDL" in the last 168 hours.  Hematology Recent Labs  Lab 03/04/23 0449 03/05/23 0459 03/06/23 0624  WBC 7.5 6.3 4.4  RBC 3.04* 2.89* 2.94*  HGB 8.9* 8.4* 8.9*  HCT 27.6* 26.4* 27.6*  MCV 90.8 91.3 93.9  MCH 29.3 29.1 30.3  MCHC 32.2 31.8 32.2  RDW 18.0* 18.0* 17.9*  PLT 270 220 241   Thyroid  Recent Labs  Lab 03/06/23 0624  TSH 1.916    BNP Recent Labs  Lab 03/05/23 0459  BNP 369.4*    DDimer No results for input(s): "DDIMER" in the last 168 hours.   Radiology/Studies:  ECHOCARDIOGRAM COMPLETE  Result Date: 03/06/2023    ECHOCARDIOGRAM REPORT   Patient Name:   DAILIN MAGANA [facility] Date of Exam: 03/06/2023 Medical Rec #:  [id number]       Height:       63.0 in Accession #:    [id number]      Weight:       229.9 lb Date of Birth:  1951/03/31      BSA:          2.052 m Patient Age:    71 years        BP:           119/52 mmHg Patient Gender: F               HR:           79 bpm. Exam Location:  Inpatient Procedure: 2D Echo, Color Doppler and Cardiac Doppler Indications:    fluid overload  History:        Patient has prior history of Echocardiogram examinations, most                 recent 10/27/2020. CAD; Risk Factors:Hypertension and                 Dyslipidemia.  Sonographer:    Delcie Roch RDCS Referring Phys: Nilda Calamity AKULA IMPRESSIONS  1. Left ventricular ejection fraction, by estimation, is 65 to 70%. The left ventricle has normal function. The left ventricle has no regional  wall motion abnormalities. Left ventricular diastolic parameters are consistent with Grade II diastolic dysfunction (pseudonormalization).  2. Right ventricular systolic function is normal. The right ventricular size is dilated. There is severely elevated pulmonary artery systolic pressure. The estimated right ventricular systolic pressure is 61.5 mmHg.  3. Left atrial size  was mildly dilated.  4. The mitral valve is normal in structure. Mild to moderate mitral valve regurgitation.  5. Multiple jets. Tricuspid valve regurgitation is mild to moderate.  6. The aortic valve is tricuspid. Aortic valve regurgitation is not visualized. No aortic stenosis is present.  7. The inferior vena cava is dilated in size with <50% respiratory variability, suggesting right atrial pressure of 15 mmHg. Comparison(s): Prior images reviewed side by side. RV is dilated compared to prior on 4 chamber live views. RVSP has increased. FINDINGS  Left Ventricle: Left ventricular ejection fraction, by estimation, is 65 to 70%. The left ventricle has normal function. The left ventricle has no regional wall motion abnormalities. The left ventricular internal cavity size was normal in size. There is  no left ventricular hypertrophy. Left ventricular diastolic parameters are consistent with Grade II diastolic dysfunction (pseudonormalization). Right Ventricle: The right ventricular size is dilated. No increase in right ventricular wall thickness. Right ventricular systolic function is normal. There is severely elevated pulmonary artery systolic pressure. The tricuspid regurgitant velocity is 3.41 m/s, and with an assumed right atrial pressure of 15 mmHg, the estimated right ventricular systolic pressure is 61.5 mmHg. Left Atrium: Left atrial size was mildly dilated. Right Atrium: Right atrial size was normal in size. Pericardium: Trivial pericardial effusion is present. The pericardial effusion is circumferential. Presence of epicardial fat layer.  Mitral Valve: The mitral valve is normal in structure. Mild to moderate mitral valve regurgitation. Tricuspid Valve: Multiple jets. The tricuspid valve is normal in structure. Tricuspid valve regurgitation is mild to moderate. Aortic Valve: The aortic valve is tricuspid. Aortic valve regurgitation is not visualized. No aortic stenosis is present. Pulmonic Valve: The pulmonic valve was normal in structure. Pulmonic valve regurgitation is trivial. No evidence of pulmonic stenosis. Aorta: The aortic root, ascending aorta and aortic arch are all structurally normal, with no evidence of dilitation or obstruction. Venous: The inferior vena cava is dilated in size with less than 50% respiratory variability, suggesting right atrial pressure of 15 mmHg. IAS/Shunts: The atrial septum is grossly normal.  LEFT VENTRICLE PLAX 2D LVIDd:         5.20 cm   Diastology LVIDs:         3.00 cm   LV e' medial:    6.85 cm/s LV PW:         0.80 cm   LV E/e' medial:  21.6 LV IVS:        0.90 cm   LV e' lateral:   7.62 cm/s LVOT diam:     1.80 cm   LV E/e' lateral: 19.4 LV SV:         61 LV SV Index:   30 LVOT Area:     2.54 cm  RIGHT VENTRICLE             IVC RV Basal diam:  2.60 cm     IVC diam: 2.40 cm RV S prime:     11.70 cm/s TAPSE (M-mode): 1.9 cm LEFT ATRIUM             Index        RIGHT ATRIUM           Index LA diam:        4.40 cm 2.14 cm/m   RA Area:     13.70 cm LA Vol (A2C):   75.2 ml 36.65 ml/m  RA Volume:   30.00 ml  14.62 ml/m LA Vol (A4C):   62.0 ml  30.22 ml/m LA Biplane Vol: 69.1 ml 33.68 ml/m  AORTIC VALVE LVOT Vmax:   128.00 cm/s LVOT Vmean:  83.400 cm/s LVOT VTI:    0.239 m  AORTA Ao Root diam: 3.00 cm Ao Asc diam:  3.40 cm MITRAL VALVE                  TRICUSPID VALVE MV Area (PHT): 4.15 cm       TR Peak grad:   46.5 mmHg MV Decel Time: 183 msec       TR Vmax:        341.00 cm/s MR Peak grad:    82.8 mmHg MR Mean grad:    59.0 mmHg    SHUNTS MR Vmax:         455.00 cm/s  Systemic VTI:  0.24 m MR Vmean:         364.0 cm/s   Systemic Diam: 1.80 cm MR PISA:         0.57 cm MR PISA Eff ROA: 5 mm MR PISA Radius:  0.30 cm MV E velocity: 148.00 cm/s MV A velocity: 77.10 cm/s MV E/A ratio:  1.92 Riley Lam MD Electronically signed by Riley Lam MD Signature Date/Time: 03/06/2023/1:55:10 PM    Final    DG CHEST PORT 1 VIEW  Result Date: 03/05/2023 CLINICAL DATA:  Wheezing EXAM: PORTABLE CHEST 1 VIEW COMPARISON:  02/02/2023 FINDINGS: Cardiac shadow is within normal limits. Mild aortic calcifications are seen. The lungs are well aerated bilaterally. Mild central vascular congestion is again noted. No significant edema is seen. No focal infiltrate is noted. No bony abnormality is seen. IMPRESSION: Mild vascular congestion stable from the prior exam. Electronically Signed   By: Alcide Clever M.D.   On: 03/05/2023 19:05   US RENAL  Result Date: 03/04/2023 CLINICAL DATA:  Renal lesion. EXAM: RENAL / URINARY TRACT ULTRASOUND COMPLETE COMPARISON:  Noncontrast CT 03/03/2023 and older CTs FINDINGS: Right Kidney: Renal measurements: 9.4 x 4.6 x 5.4 cm = volume: 122.3 mL. Echogenicity within normal limits. No mass or hydronephrosis visualized. Left Kidney: Renal measurements: 9.1 x 5.4 x 5.2 cm = volume: 132.5 mL. Mild left-sided renal atrophy, parenchymal loss. No collecting system dilatation or perinephric fluid. Bladder: Bladder is underdistended.  Grossly preserved contour. Other: None. IMPRESSION: No collecting system dilatation. Mild global atrophy of the left kidney. Of note no specific renal lesion identified. Overall, recommend either MRI evaluation or follow up CT scan in 6 months Electronically Signed   By: Karen Kays M.D.   On: 03/04/2023 18:39   CT ABDOMEN PELVIS WO CONTRAST  Result Date: 03/03/2023 CLINICAL DATA:  Abdominal pain EXAM: CT ABDOMEN AND PELVIS WITHOUT CONTRAST TECHNIQUE: Multidetector CT imaging of the abdomen and pelvis was performed following the standard protocol without IV  contrast. RADIATION DOSE REDUCTION: This exam was performed according to the departmental dose-optimization program which includes automated exposure control, adjustment of the mA and/or kV according to patient size and/or use of iterative reconstruction technique. COMPARISON:  CT abdomen and pelvis 02/02/2023 FINDINGS: Lower chest: No acute abnormality. Hepatobiliary: No focal liver abnormality is seen. Status post cholecystectomy. No biliary dilatation. Pancreas: Unremarkable. No pancreatic ductal dilatation or surrounding inflammatory changes. Spleen: Normal in size without focal abnormality. Adrenals/Urinary Tract: There is a rounded hypodensity in the superior pole the right kidney measuring less than 1 cm, unchanged. There is no hydronephrosis or urinary tract calculus. The adrenal glands and bladder are within normal limits. Stomach/Bowel: Stomach is within normal limits.  Gastric band in place. Appendix appears normal. No acute inflammation, pneumatosis or free air. There some subtle questionable wall thickening of the mid sigmoid colon measuring 5.5 cm in length on image 6/63. Vascular/Lymphatic: Aortic atherosclerosis. No enlarged abdominal or pelvic lymph nodes. Reproductive: Uterus and bilateral adnexa are unremarkable. Other: No abdominal wall hernia or abnormality. There is mild body wall edema. No abdominopelvic ascites. Musculoskeletal: Compression deformities of L2, L3 and L4 appear stable. IMPRESSION: 1. Questionable wall thickening of the mid sigmoid colon. Correlate clinically for colitis. Neoplasm can not be excluded. Recommend further evaluation with colonoscopy. 2. Mild body wall edema. 3. Stable hypodense lesion in the right kidney, indeterminate. This can be further evaluated with ultrasound or MRI. 4. Gastric band in place. No bowel obstruction. 5. Aortic atherosclerosis. Aortic Atherosclerosis (ICD10-I70.0). Electronically Signed   By: Darliss Cheney M.D.   On: 03/03/2023 23:08   VAS [place]  LOWER EXTREMITY VENOUS (DVT)  Result Date: 03/03/2023  Lower Venous DVT Study Patient Name:  LYANN ATHENS [facility]  Date of Exam:   03/03/2023 Medical Rec #: [id number]        Accession #:    [id number] Date of Birth: Jul 06, 1951       Patient Gender: F Patient Age:   59 years Exam Location:  [facility] Procedure:      VAS [place] LOWER EXTREMITY VENOUS (DVT) Referring Phys: Kathlen Mody --------------------------------------------------------------------------------  Indications: Swelling.  Comparison Study: No previous study. Performing Technologist: McKayla Maag RVT, [place]  Examination Guidelines: A complete evaluation includes B-mode imaging, spectral Doppler, color Doppler, and power Doppler as needed of all accessible portions of each vessel. Bilateral testing is considered an integral part of a complete examination. Limited examinations for reoccurring indications may be performed as noted. The reflux portion of the exam is performed with the patient in reverse Trendelenburg.  +--------+---------------+---------+-----------+----------+--------------------+ RIGHT   CompressibilityPhasicitySpontaneityPropertiesThrombus Aging       +--------+---------------+---------+-----------+----------+--------------------+ CFV     Full           Yes      Yes                                       +--------+---------------+---------+-----------+----------+--------------------+ SFJ     Full                                                              +--------+---------------+---------+-----------+----------+--------------------+ FV Prox Full                                                              +--------+---------------+---------+-----------+----------+--------------------+ FV Mid  Full                                                              +--------+---------------+---------+-----------+----------+--------------------+ FV  Yes      Yes                  patent  by color      Distal                                               doppler              +--------+---------------+---------+-----------+----------+--------------------+ PFV     Full                                                              +--------+---------------+---------+-----------+----------+--------------------+ POP                    Yes      Yes                  patent by color                                                           doppler              +--------+---------------+---------+-----------+----------+--------------------+ PTV                                                  Not visualized       +--------+---------------+---------+-----------+----------+--------------------+ PERO                                                 Not visualized       +--------+---------------+---------+-----------+----------+--------------------+   +--------+---------------+---------+-----------+----------+--------------------+ LEFT    CompressibilityPhasicitySpontaneityPropertiesThrombus Aging       +--------+---------------+---------+-----------+----------+--------------------+ CFV     Full           Yes      Yes                                       +--------+---------------+---------+-----------+----------+--------------------+ SFJ     Full                                                              +--------+---------------+---------+-----------+----------+--------------------+ FV Prox Full                                                              +--------+---------------+---------+-----------+----------+--------------------+  FV Mid  Full                                                              +--------+---------------+---------+-----------+----------+--------------------+ FV                     Yes      Yes                  patent by color      Distal                                               doppler               +--------+---------------+---------+-----------+----------+--------------------+ PFV     Full                                                              +--------+---------------+---------+-----------+----------+--------------------+ POP     Full           Yes      Yes                                       +--------+---------------+---------+-----------+----------+--------------------+ PTV                                                  Not visualized       +--------+---------------+---------+-----------+----------+--------------------+ PERO                                                 Not visualized       +--------+---------------+---------+-----------+----------+--------------------+     Summary: RIGHT: - There is no evidence of deep vein thrombosis in the lower extremity. However, portions of this examination were limited- see technologist comments above.  - No cystic structure found in the popliteal fossa.  LEFT: - There is no evidence of deep vein thrombosis in the lower extremity. However, portions of this examination were limited- see technologist comments above.  - No cystic structure found in the popliteal fossa.  *See table(s) above for measurements and observations. Electronically signed by Coral Else MD on 03/03/2023 at 9:40:06 PM.    Final      Assessment and Plan:   Pre-op eval in pt with known CAD with BMS to LAD in 2001 and PTCA to D1 in 2001 and 2002. Last cath 2017 patent and last nuc study 2022 no ischemia.  She has no angina but is in chair or bed  all day. Last nuc study in 2022 was no ischemia.  Her echo is similar to older one  though now with right ventricular systolic pressure is 61.5 mmHg. She is class 4 risk 11% risk of major cardiac events though she is stable from cardiology perspective.  Dr. Antoine Poche to see and give further opinion PAF and SVT with CHA2DS2VASc of 6 with hx of CVA. Has been on eliquis but not on anticoagulation since  8/22 AM  on 8/22 had RVR unsure if a fib vs SVT and now off Tele I have reordered- her initial a fib was with diverticulitis   her BB held for hypotension --recommend tele continuous with her hx of a fib and off BB and  a fib with acute illness in past.   AKI has improved overall but beginning to climb Pulmonary HTN with right ventricular systolic pressure is 61.5 mmHg.  Hypotension on midodrine  BP now 108/48  CAD with hx of stent to LAD and PTCA to D1 in 2001 and 2002, last nuc study was neg for ischemia.  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  RA/hyponatremia/anemia per IM   Risk Assessment/Risk Scores:       CHA2DS2-VASc Score = 6   This indicates a 9.7% annual risk of stroke. The patient's score is based upon: CHF History: 1 HTN History: 1 Diabetes History: 0 Stroke History: 2 Vascular Disease History: 0 Age Score: 1 Gender Score: 1     For questions or updates, please contact [facility] Please consult [email or web address] for contact info under    Signed, Nada Boozer, NP  03/07/2023 12:53 PM  History and all data above reviewed.  Patient examined.  I agree with the findings as above.  The patient has chronic debility from orthopedic issues and ongoing GI complaints.  She has not had any new SOB, PND or orthopnea.  She has occasional short lived palpitations but no presyncope or syncope.  She denies new chest pain, neck or arm pain. The patient exam reveals COR:RRR  ,  Lungs: Decreased breath sounds  ,  Abd: Positive bowel sounds, no rebound no guarding, Ext Diffuse edema.   .  All available labs, radiology testing, previous records reviewed. Agree with documented assessment and plan. Preop:   The patient had no high risk cardiac symptoms.   She does have significant risk associated with her  co morbid conditions and debilitated state.  However, there is no preop cardiovascular testing that will reduce that risk.  She needs to be followed closely for her rhythm intra/post op.  Given elevated pulmonary pressures we need to be aware that she will likely be somewhat preload dependent and avoid excessive diuresis.  Arrhythmia:  Brief run of atrial tach vs atrial fib.  No indication for systemic anticoagulation at this time.   She will need continuous tele.    Elevated BNP:  Likely some element of diastolic dysfunction.  Follow oxygenation with caution above For now continue low dose PO Lasix.  She has edema but I would again avoid over diuresis at this point preop.   Volume status will likely need very close follow up (intake output weights) through this hospitalization.  We will follow.    Fayrene Fearing Chandel Zaun  3:22 PM  03/07/2023

## 2023-03-07 NOTE — Consult Note (Signed)
WOC Nurse requested for preoperative stoma site marking  Discussed surgical procedure and stoma creation with patient and family.  Explained role of the WOC nurse team.  Provided the patient with educational booklet and provided samples of pouching options.  Answered patient and family questions.   Examined patient lying, sitting, and standing in order to place the marking in the patient's visual field, away from any creases or abdominal contour issues and within the rectus muscle.  Attempted to mark below the patient's belt line. Patient has a large crease upper quadrant across abdomen, this area was marked as do not place.    Marked for colostomy in the LLQ  7.5  cm to the left of the umbilicus and 4 cm below the umbilicus.  Marked for ileostomy in the RLQ 8.5 cm to the right of the umbilicus and  4 cm below the umbilicus.   Patient's abdomen cleansed with CHG wipes at site markings, allowed to air dry prior to marking.Covered mark with thin film transparent dressing to preserve mark until date of surgery.   WOC Nurse team will follow up with patient after surgery for continue ostomy care and teaching.   Thank you,    Priscella Mann MSN, RN-BC, [employer] [phone]

## 2023-03-07 NOTE — Progress Notes (Signed)
Pt unable to tolerate Ensure drink and miralax due to "feeling full." She was able to drink approx. 30ml. Pt took 500mg  of 100mg  flagyl and 100mg  out of 400mg  allopurinol. Dr. Antionette Char was made aware. Provided encouragement and support.

## 2023-03-07 NOTE — H&P (View-Only) (Signed)
3 Days Post-Op  Subjective: Patient has been having symptoms for about 4 months.  Still with some intermittent nausea.  Tolerating some liquids, but not always.  Has been marked for ostomies   Objective: Vital signs in last 24 hours: Temp:  [97.6 F (36.4 C)-98.8 F (37.1 C)] 98.8 F (37.1 C) (08/26 0853) Pulse Rate:  [80-94] 94 (08/26 0853) Resp:  [16-18] 17 (08/26 0853) BP: (106-124)/(44-56) 108/48 (08/26 0853) SpO2:  [97 %-100 %] 97 % (08/26 0853) Last BM Date : 03/06/23  Intake/Output from previous day: 08/25 0701 - 08/26 0700 In: 420 [P.O.:420] Out: 0  Intake/Output this shift: No intake/output data recorded.  PE: Abd: soft, morbidly obese, +BS, tenderness on right and left side of abdomen, but no peritonitis or guarding, no distention  Lab Results:  Recent Labs    03/05/23 0459 03/06/23 0624  WBC 6.3 4.4  HGB 8.4* 8.9*  HCT 26.4* 27.6*  PLT 220 241   BMET Recent Labs    03/06/23 0624 03/07/23 0459  NA 131* 131*  K 4.0 3.5  CL 103 103  CO2 21* 19*  GLUCOSE 131* 93  BUN 22 21  CREATININE 1.41* 1.46*  CALCIUM 7.6* 7.7*   PT/INR No results for input(s): "LABPROT", "INR" in the last 72 hours. CMP     Component Value Date/Time   NA 131 (L) 03/07/2023 0459   NA 141 08/13/2022 1304   K 3.5 03/07/2023 0459   CL 103 03/07/2023 0459   CO2 19 (L) 03/07/2023 0459   GLUCOSE 93 03/07/2023 0459   BUN 21 03/07/2023 0459   BUN 19 08/13/2022 1304   CREATININE 1.46 (H) 03/07/2023 0459   CREATININE 0.68 11/13/2015 1047   CALCIUM 7.7 (L) 03/07/2023 0459   PROT 4.9 (L) 03/06/2023 0624   PROT 6.3 08/13/2022 1304   ALBUMIN 2.2 (L) 03/06/2023 0624   ALBUMIN 4.4 08/13/2022 1304   AST 12 (L) 03/06/2023 0624   ALT 9 03/06/2023 0624   ALKPHOS 52 03/06/2023 0624   BILITOT 0.5 03/06/2023 0624   BILITOT 0.5 08/13/2022 1304   GFRNONAA 38 (L) 03/07/2023 0459   GFRAA >60 12/11/2018 1222   Lipase     Component Value Date/Time   LIPASE 50 03/02/2023 1119        Studies/Results: ECHOCARDIOGRAM COMPLETE  Result Date: 03/06/2023    ECHOCARDIOGRAM REPORT   Patient Name:   Cindy Holmes [facility] Date of Exam: 03/06/2023 Medical Rec #:  [id number]       Height:       63.0 in Accession #:    [id number]      Weight:       229.9 lb Date of Birth:  06-Jan-1951      BSA:          2.052 m Patient Age:    71 years        BP:           119/52 mmHg Patient Gender: F               HR:           79 bpm. Exam Location:  Inpatient Procedure: 2D Echo, Color Doppler and Cardiac Doppler Indications:    fluid overload  History:        Patient has prior history of Echocardiogram examinations, most                 recent 10/27/2020. CAD; Risk Factors:Hypertension and  Dyslipidemia.  Sonographer:    Delcie Roch RDCS Referring Phys: Nilda Calamity AKULA IMPRESSIONS  1. Left ventricular ejection fraction, by estimation, is 65 to 70%. The left ventricle has normal function. The left ventricle has no regional wall motion abnormalities. Left ventricular diastolic parameters are consistent with Grade II diastolic dysfunction (pseudonormalization).  2. Right ventricular systolic function is normal. The right ventricular size is dilated. There is severely elevated pulmonary artery systolic pressure. The estimated right ventricular systolic pressure is 61.5 mmHg.  3. Left atrial size was mildly dilated.  4. The mitral valve is normal in structure. Mild to moderate mitral valve regurgitation.  5. Multiple jets. Tricuspid valve regurgitation is mild to moderate.  6. The aortic valve is tricuspid. Aortic valve regurgitation is not visualized. No aortic stenosis is present.  7. The inferior vena cava is dilated in size with <50% respiratory variability, suggesting right atrial pressure of 15 mmHg. Comparison(s): Prior images reviewed side by side. RV is dilated compared to prior on 4 chamber live views. RVSP has increased. FINDINGS  Left Ventricle: Left ventricular ejection fraction, by  estimation, is 65 to 70%. The left ventricle has normal function. The left ventricle has no regional wall motion abnormalities. The left ventricular internal cavity size was normal in size. There is  no left ventricular hypertrophy. Left ventricular diastolic parameters are consistent with Grade II diastolic dysfunction (pseudonormalization). Right Ventricle: The right ventricular size is dilated. No increase in right ventricular wall thickness. Right ventricular systolic function is normal. There is severely elevated pulmonary artery systolic pressure. The tricuspid regurgitant velocity is 3.41 m/s, and with an assumed right atrial pressure of 15 mmHg, the estimated right ventricular systolic pressure is 61.5 mmHg. Left Atrium: Left atrial size was mildly dilated. Right Atrium: Right atrial size was normal in size. Pericardium: Trivial pericardial effusion is present. The pericardial effusion is circumferential. Presence of epicardial fat layer. Mitral Valve: The mitral valve is normal in structure. Mild to moderate mitral valve regurgitation. Tricuspid Valve: Multiple jets. The tricuspid valve is normal in structure. Tricuspid valve regurgitation is mild to moderate. Aortic Valve: The aortic valve is tricuspid. Aortic valve regurgitation is not visualized. No aortic stenosis is present. Pulmonic Valve: The pulmonic valve was normal in structure. Pulmonic valve regurgitation is trivial. No evidence of pulmonic stenosis. Aorta: The aortic root, ascending aorta and aortic arch are all structurally normal, with no evidence of dilitation or obstruction. Venous: The inferior vena cava is dilated in size with less than 50% respiratory variability, suggesting right atrial pressure of 15 mmHg. IAS/Shunts: The atrial septum is grossly normal.  LEFT VENTRICLE PLAX 2D LVIDd:         5.20 cm   Diastology LVIDs:         3.00 cm   LV e' medial:    6.85 cm/s LV PW:         0.80 cm   LV E/e' medial:  21.6 LV IVS:        0.90 cm    LV e' lateral:   7.62 cm/s LVOT diam:     1.80 cm   LV E/e' lateral: 19.4 LV SV:         61 LV SV Index:   30 LVOT Area:     2.54 cm  RIGHT VENTRICLE             IVC RV Basal diam:  2.60 cm     IVC diam: 2.40 cm RV S prime:  11.70 cm/s TAPSE (M-mode): 1.9 cm LEFT ATRIUM             Index        RIGHT ATRIUM           Index LA diam:        4.40 cm 2.14 cm/m   RA Area:     13.70 cm LA Vol (A2C):   75.2 ml 36.65 ml/m  RA Volume:   30.00 ml  14.62 ml/m LA Vol (A4C):   62.0 ml 30.22 ml/m LA Biplane Vol: 69.1 ml 33.68 ml/m  AORTIC VALVE LVOT Vmax:   128.00 cm/s LVOT Vmean:  83.400 cm/s LVOT VTI:    0.239 m  AORTA Ao Root diam: 3.00 cm Ao Asc diam:  3.40 cm MITRAL VALVE                  TRICUSPID VALVE MV Area (PHT): 4.15 cm       TR Peak grad:   46.5 mmHg MV Decel Time: 183 msec       TR Vmax:        341.00 cm/s MR Peak grad:    82.8 mmHg MR Mean grad:    59.0 mmHg    SHUNTS MR Vmax:         455.00 cm/s  Systemic VTI:  0.24 m MR Vmean:        364.0 cm/s   Systemic Diam: 1.80 cm MR PISA:         0.57 cm MR PISA Eff ROA: 5 mm MR PISA Radius:  0.30 cm MV E velocity: 148.00 cm/s MV A velocity: 77.10 cm/s MV E/A ratio:  1.92 Riley Lam MD Electronically signed by Riley Lam MD Signature Date/Time: 03/06/2023/1:55:10 PM    Final    DG CHEST PORT 1 VIEW  Result Date: 03/05/2023 CLINICAL DATA:  Wheezing EXAM: PORTABLE CHEST 1 VIEW COMPARISON:  02/02/2023 FINDINGS: Cardiac shadow is within normal limits. Mild aortic calcifications are seen. The lungs are well aerated bilaterally. Mild central vascular congestion is again noted. No significant edema is seen. No focal infiltrate is noted. No bony abnormality is seen. IMPRESSION: Mild vascular congestion stable from the prior exam. Electronically Signed   By: Alcide Clever M.D.   On: 03/05/2023 19:05    Anti-infectives: Anti-infectives (From admission, onward)    Start     Dose/Rate Route Frequency Ordered Stop   03/07/23 0600  cefoTEtan  (CEFOTAN) 2 g in sodium chloride 0.9 % 100 mL IVPB        2 g 200 mL/hr over 30 Minutes Intravenous On call to O.R. 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 -may have CLD today and NPO p MN -discussed with patient and all 3 children at the bedside.  -cards clearance and risk stratification pending.  FEN - CLD, NPO p MN VTE - Lovenox ID - cefotetan on call to OR  CAD - eliquis on Hold CHF HTN HLD RA on Plaquenil and methotrexate.  Currently on hold Morbid obesity  I reviewed Consultant cardiology notes, hospitalist notes, last 24 h vitals and pain scores, last 48 h intake and output, last 24 h labs and trends, and last 24 h imaging results.   LOS: 5 days    Letha Cape , [facility] 03/07/2023, 1:00 PM Please see Amion for pager number during day hours 7:00am-4:30pm or 7:00am -11:30am on weekends

## 2023-03-07 NOTE — Progress Notes (Signed)
[facility]                                                                               Cindy Holmes, is a 72 y.o. female, DOB - 01/20/1951, ZOX:[id number] Admit date - 03/02/2023    Outpatient Primary MD for the patient is [name], MD  LOS - 5  days    Brief summary    Cindy Holmes is a 72 y.o. female with medical history significant of hypertension, rheumatoid arthritis on immunosuppressives, CAD, chronic HFpEF, and history of CVA who presents with persistent diarrhea, nausea and vomiting.  She underwent  colonoscopy by Dr Elnoria Howard and was found to have benign- appearing, intrinsic moderate stenosis measuring 5 cm ( in length) was found in the sigmoid colon . General surgery consulted and she is scheduled for colectomy and possible colostomy tomorrow. Cardiology consulted for pre op clearance and for diastolic CHF/ Pulmonary hypertension.  Assessment & Plan    Assessment and Plan:  Overflow diarrhea: -several months of persistent symptoms with PCR positive for enteropathogenic E. Coli during recent hospitalization.  Symptoms persistented after completion of antibiotics.  - c diff pcr and toxin negative. Repeat GI panel is negative.  -GI consulted , she underwent colonoscopy showing sigmoid colon narrowing, benign- appearing, intrinsic moderate stenosis measuring 5 cm ( in length) was found in the sigmoid colon General surgery consulted for recommendations.   She will probably need ex lap and resection with colostomy.  To be scheduled in the next 24 hours.   * AKI (acute kidney injury) (HCC) Initially thought secondary to diarrhea and poor oral intake, sightly improved to 1.2 with fluids but a little higher today to 1.4.   Obesity, Class III, BMI 40-49.9 (morbid obesity) (HCC) BMI of 40  PAF (paroxysmal atrial fibrillation) (HCC) Eliquis on hold for the procedure.  Metoprolol held for hypotension.   Rheumatoid arthritis (HCC) -pt is on methotrexate  and plaquenil which are on hold for now for the procedure.   Hyperlipidemia -Continue statin  Essential hypertension BP parameters are on the softer side.  Midodrine added with improvement in BP parameters.   Acute chronic diastolic CHF/ Pulmonary hypertension:  Bilateral leg edema, vascular congestion on CXR, elevated bnp Echocardiogram this admit showing LVEF of 65 to 70% , no RWMA, severely elevated pulmonary artery systolic pressures, The estimated right ventricular systolic pressure is 61.5 mmHg .  Cardiology consulted for recommendations.  Started patient on LASIX 20 MG BID.  Continue with strict intake and output.   Hypokalemia And Hypomagnesemia Replaced.  Recheck in am.   Mild hyponatremia Sodium of 131 today.  Suspect a component of fluid overload.  TSH wnl. Urine sodium less than 10.  Serum osmo is 282.   Anemia of chronic disease:  Hemoglobin around 8. Continue to monitor.    Metabolic acidosis On sodium bicarb tablets.     Body mass index is 40.73 kg/m. Obesity.   Estimated body mass index is 40.73 kg/m as calculated from the following:   Height as of this encounter: 5\' 3"  (1.6 m).   Weight as of this encounter: 104.3 kg.  Code Status: full code.  DVT Prophylaxis:  SCD's Start: 03/06/23 1038/ Lovenox.    Level of Care: Level of care: Telemetry Medical Family Communication: none at bedside.   Disposition Plan:     Remains inpatient appropriate:  pending colectomy tomorrow.   Procedures:  CT abd and pelvis.  Echocardiogram.   Consultants:   Gastroenterology Dr Elnoria Howard.  General surgery.   Antimicrobials:   Anti-infectives (From admission, onward)    Start     Dose/Rate Route Frequency Ordered Stop   03/07/23 0600  cefoTEtan (CEFOTAN) 2 g in sodium chloride 0.9 % 100 mL IVPB        2 g 200 mL/hr over 30 Minutes Intravenous On call to O.R. 03/06/23 1037 03/08/23 0559   03/06/23 1400  neomycin (MYCIFRADIN) tablet 1,000 mg       Placed in "And"  Linked Group   1,000 mg Oral 3 times per day 03/06/23 1037 03/06/23 2243   03/06/23 1400  metroNIDAZOLE (FLAGYL) tablet 1,000 mg       Placed in "And" Linked Group   1,000 mg Oral 3 times per day 03/06/23 1037 03/07/23 1359   03/02/23 2000  azithromycin (ZITHROMAX) 500 mg in sodium chloride 0.9 % 250 mL IVPB  Status:  Discontinued        500 mg 250 mL/hr over 60 Minutes Intravenous Every 24 hours 03/02/23 1959 03/04/23 1745        Medications  Scheduled Meds:  [START ON 03/08/2023] acetaminophen  1,000 mg Oral On Call to OR   allopurinol  400 mg Oral QHS   aspirin EC  81 mg Oral QHS   atorvastatin  80 mg Oral Daily   enoxaparin (LOVENOX) injection  50 mg Subcutaneous Q24H   ezetimibe  10 mg Oral Daily   furosemide  20 mg Oral BID   midodrine  10 mg Oral TID WC   mupirocin ointment  1 Application Nasal BID   polyethylene glycol  17 g Oral BID   sodium bicarbonate  650 mg Oral BID   zolpidem  5 mg Oral QHS   Continuous Infusions:  sodium chloride 50 mL/hr at 03/07/23 0547   cefoTEtan (CEFOTAN) IV      PRN Meds:.bisacodyl, ipratropium-albuterol, ondansetron (ZOFRAN) IV    Subjective:   Cindy Holmes was seen and examined today.  Persistent leg edema,nauseated.   Objective:   Vitals:   03/06/23 1727 03/06/23 2143 03/07/23 0525 03/07/23 0853  BP: (!) 109/56 (!) 124/54 (!) 117/47 (!) 108/48  Pulse: 93 80 88 94  Resp: 16 18  17   Temp: 97.8 F (36.6 C) 97.6 F (36.4 C) 98.6 F (37 C) 98.8 F (37.1 C)  TempSrc:  Oral    SpO2: 99% 98% 100% 97%  Weight:      Height:        Intake/Output Summary (Last 24 hours) at 03/07/2023 1430 Last data filed at 03/07/2023 1316 Gross per 24 hour  Intake 180 ml  Output 0 ml  Net 180 ml   Filed Weights   03/02/23 1115  Weight: 104.3 kg     Exam General exam: Appears calm and comfortable  Respiratory system: diminished air entry at bases, crackles at bases,  Cardiovascular system: S1 & S2 heard, RRR. Gastrointestinal  system: Abdomen is nondistended, soft and nontender. . Central nervous system: Alert and oriented. No focal neurological deficits. Extremities: bilateral leg edema.  Skin: No rashes,  Psychiatry: Mood & affect appropriate.         Data Reviewed:  I have personally reviewed following  labs and imaging studies   CBC Lab Results  Component Value Date   WBC 4.4 03/06/2023   RBC 2.94 (L) 03/06/2023   HGB 8.9 (L) 03/06/2023   HCT 27.6 (L) 03/06/2023   MCV 93.9 03/06/2023   MCH 30.3 03/06/2023   PLT 241 03/06/2023   MCHC 32.2 03/06/2023   RDW 17.9 (H) 03/06/2023   LYMPHSABS 0.8 03/06/2023   MONOABS 0.2 03/06/2023   EOSABS 0.0 03/06/2023   BASOSABS 0.0 03/06/2023     Last metabolic panel Lab Results  Component Value Date   NA 131 (L) 03/07/2023   K 3.5 03/07/2023   CL 103 03/07/2023   CO2 19 (L) 03/07/2023   BUN 21 03/07/2023   CREATININE 1.46 (H) 03/07/2023   GLUCOSE 93 03/07/2023   GFRNONAA 38 (L) 03/07/2023   GFRAA >60 12/11/2018   CALCIUM 7.7 (L) 03/07/2023   PROT 4.9 (L) 03/06/2023   ALBUMIN 2.2 (L) 03/06/2023   LABGLOB 1.9 08/13/2022   AGRATIO 2.3 (H) 08/13/2022   BILITOT 0.5 03/06/2023   ALKPHOS 52 03/06/2023   AST 12 (L) 03/06/2023   ALT 9 03/06/2023   ANIONGAP 9 03/07/2023    CBG (last 3)  No results for input(s): "GLUCAP" in the last 72 hours.    Coagulation Profile: No results for input(s): "INR", "PROTIME" in the last 168 hours.   Radiology Studies: ECHOCARDIOGRAM COMPLETE  Result Date: 03/06/2023    ECHOCARDIOGRAM REPORT   Patient Name:   [facility] Date of Exam: 03/06/2023 Medical Rec #:  [id number]       Height:       63.0 in Accession #:    [id number]      Weight:       229.9 lb Date of Birth:  [birth year]      BSA:          2.052 m Patient Age:    71 years        BP:           119/52 mmHg Patient Gender: F               HR:           79 bpm. Exam Location:  Inpatient Procedure: 2D Echo, Color Doppler and Cardiac Doppler Indications:     fluid overload  History:        Patient has prior history of Echocardiogram examinations, most                 recent 10/27/2020. CAD; Risk Factors:Hypertension and                 Dyslipidemia.  Sonographer:    Delcie Roch RDCS Referring Phys: Nilda Calamity Cheyann Blecha IMPRESSIONS  1. Left ventricular ejection fraction, by estimation, is 65 to 70%. The left ventricle has normal function. The left ventricle has no regional wall motion abnormalities. Left ventricular diastolic parameters are consistent with Grade II diastolic dysfunction (pseudonormalization).  2. Right ventricular systolic function is normal. The right ventricular size is dilated. There is severely elevated pulmonary artery systolic pressure. The estimated right ventricular systolic pressure is 61.5 mmHg.  3. Left atrial size was mildly dilated.  4. The mitral valve is normal in structure. Mild to moderate mitral valve regurgitation.  5. Multiple jets. Tricuspid valve regurgitation is mild to moderate.  6. The aortic valve is tricuspid. Aortic valve regurgitation is not visualized. No aortic stenosis is present.  7. The inferior vena cava is dilated in size  with <50% respiratory variability, suggesting right atrial pressure of 15 mmHg. Comparison(s): Prior images reviewed side by side. RV is dilated compared to prior on 4 chamber live views. RVSP has increased. FINDINGS  Left Ventricle: Left ventricular ejection fraction, by estimation, is 65 to 70%. The left ventricle has normal function. The left ventricle has no regional wall motion abnormalities. The left ventricular internal cavity size was normal in size. There is  no left ventricular hypertrophy. Left ventricular diastolic parameters are consistent with Grade II diastolic dysfunction (pseudonormalization). Right Ventricle: The right ventricular size is dilated. No increase in right ventricular wall thickness. Right ventricular systolic function is normal. There is severely elevated pulmonary  artery systolic pressure. The tricuspid regurgitant velocity is 3.41 m/s, and with an assumed right atrial pressure of 15 mmHg, the estimated right ventricular systolic pressure is 61.5 mmHg. Left Atrium: Left atrial size was mildly dilated. Right Atrium: Right atrial size was normal in size. Pericardium: Trivial pericardial effusion is present. The pericardial effusion is circumferential. Presence of epicardial fat layer. Mitral Valve: The mitral valve is normal in structure. Mild to moderate mitral valve regurgitation. Tricuspid Valve: Multiple jets. The tricuspid valve is normal in structure. Tricuspid valve regurgitation is mild to moderate. Aortic Valve: The aortic valve is tricuspid. Aortic valve regurgitation is not visualized. No aortic stenosis is present. Pulmonic Valve: The pulmonic valve was normal in structure. Pulmonic valve regurgitation is trivial. No evidence of pulmonic stenosis. Aorta: The aortic root, ascending aorta and aortic arch are all structurally normal, with no evidence of dilitation or obstruction. Venous: The inferior vena cava is dilated in size with less than 50% respiratory variability, suggesting right atrial pressure of 15 mmHg. IAS/Shunts: The atrial septum is grossly normal.  LEFT VENTRICLE PLAX 2D LVIDd:         5.20 cm   Diastology LVIDs:         3.00 cm   LV e' medial:    6.85 cm/s LV PW:         0.80 cm   LV E/e' medial:  21.6 LV IVS:        0.90 cm   LV e' lateral:   7.62 cm/s LVOT diam:     1.80 cm   LV E/e' lateral: 19.4 LV SV:         61 LV SV Index:   30 LVOT Area:     2.54 cm  RIGHT VENTRICLE             IVC RV Basal diam:  2.60 cm     IVC diam: 2.40 cm RV S prime:     11.70 cm/s TAPSE (M-mode): 1.9 cm LEFT ATRIUM             Index        RIGHT ATRIUM           Index LA diam:        4.40 cm 2.14 cm/m   RA Area:     13.70 cm LA Vol (A2C):   75.2 ml 36.65 ml/m  RA Volume:   30.00 ml  14.62 ml/m LA Vol (A4C):   62.0 ml 30.22 ml/m LA Biplane Vol: 69.1 ml 33.68 ml/m   AORTIC VALVE LVOT Vmax:   128.00 cm/s LVOT Vmean:  83.400 cm/s LVOT VTI:    0.239 m  AORTA Ao Root diam: 3.00 cm Ao Asc diam:  3.40 cm MITRAL VALVE  TRICUSPID VALVE MV Area (PHT): 4.15 cm       TR Peak grad:   46.5 mmHg MV Decel Time: 183 msec       TR Vmax:        341.00 cm/s MR Peak grad:    82.8 mmHg MR Mean grad:    59.0 mmHg    SHUNTS MR Vmax:         455.00 cm/s  Systemic VTI:  0.24 m MR Vmean:        364.0 cm/s   Systemic Diam: 1.80 cm MR PISA:         0.57 cm MR PISA Eff ROA: 5 mm MR PISA Radius:  0.30 cm MV E velocity: 148.00 cm/s MV A velocity: 77.10 cm/s MV E/A ratio:  1.92 Riley Lam MD Electronically signed by Riley Lam MD Signature Date/Time: 03/06/2023/1:55:10 PM    Final    DG CHEST PORT 1 VIEW  Result Date: 03/05/2023 CLINICAL DATA:  Wheezing EXAM: PORTABLE CHEST 1 VIEW COMPARISON:  02/02/2023 FINDINGS: Cardiac shadow is within normal limits. Mild aortic calcifications are seen. The lungs are well aerated bilaterally. Mild central vascular congestion is again noted. No significant edema is seen. No focal infiltrate is noted. No bony abnormality is seen. IMPRESSION: Mild vascular congestion stable from the prior exam. Electronically Signed   By: Alcide Clever M.D.   On: 03/05/2023 19:05       Kathlen Mody M.D. [facility] 03/07/2023, 2:30 PM  Available via Epic secure chat 7am-7pm After 7 pm, please refer to night coverage provider listed on amion.

## 2023-03-08 ENCOUNTER — Encounter (HOSPITAL_COMMUNITY)
Admission: EM | Disposition: A | Discharge status: Home Health | Payer: Self-pay | Source: Home / Self Care | Attending: Internal Medicine

## 2023-03-08 ENCOUNTER — Inpatient Hospital Stay (HOSPITAL_COMMUNITY): Payer: Medicare Other | Admitting: Anesthesiology

## 2023-03-08 ENCOUNTER — Encounter (HOSPITAL_COMMUNITY): Payer: Self-pay | Admitting: Family Medicine

## 2023-03-08 ENCOUNTER — Other Ambulatory Visit: Payer: Self-pay

## 2023-03-08 DIAGNOSIS — K56609 Unspecified intestinal obstruction, unspecified as to partial versus complete obstruction: Secondary | ICD-10-CM | POA: Diagnosis not present

## 2023-03-08 DIAGNOSIS — I5023 Acute on chronic systolic (congestive) heart failure: Secondary | ICD-10-CM | POA: Diagnosis not present

## 2023-03-08 DIAGNOSIS — E785 Hyperlipidemia, unspecified: Secondary | ICD-10-CM

## 2023-03-08 DIAGNOSIS — I480 Paroxysmal atrial fibrillation: Secondary | ICD-10-CM | POA: Diagnosis not present

## 2023-03-08 DIAGNOSIS — R197 Diarrhea, unspecified: Secondary | ICD-10-CM | POA: Diagnosis not present

## 2023-03-08 DIAGNOSIS — I10 Essential (primary) hypertension: Secondary | ICD-10-CM | POA: Diagnosis not present

## 2023-03-08 DIAGNOSIS — E782 Mixed hyperlipidemia: Secondary | ICD-10-CM | POA: Diagnosis not present

## 2023-03-08 DIAGNOSIS — I110 Hypertensive heart disease with heart failure: Secondary | ICD-10-CM | POA: Diagnosis not present

## 2023-03-08 DIAGNOSIS — N179 Acute kidney failure, unspecified: Secondary | ICD-10-CM | POA: Diagnosis not present

## 2023-03-08 HISTORY — PX: COLECTOMY WITH COLOSTOMY CREATION/HARTMANN PROCEDURE: SHX6598

## 2023-03-08 HISTORY — PX: LAPAROTOMY: SHX154

## 2023-03-08 HISTORY — PX: BOWEL RESECTION: SHX1257

## 2023-03-08 LAB — BASIC METABOLIC PANEL
Anion gap: 7 (ref 5–15)
BUN: 17 mg/dL (ref 8–23)
CO2: 23 mmol/L (ref 22–32)
Calcium: 7.6 mg/dL — ABNORMAL LOW (ref 8.9–10.3)
Chloride: 104 mmol/L (ref 98–111)
Creatinine, Ser: 1.33 mg/dL — ABNORMAL HIGH (ref 0.44–1.00)
GFR, Estimated: 43 mL/min — ABNORMAL LOW (ref >60)
Glucose, Bld: 137 mg/dL — ABNORMAL HIGH (ref 70–99)
Potassium: 3.8 mmol/L (ref 3.5–5.1)
Sodium: 134 mmol/L — ABNORMAL LOW (ref 135–145)

## 2023-03-08 LAB — TYPE AND SCREEN
ABO/RH(D): A POS
Antibody Screen: NEGATIVE

## 2023-03-08 SURGERY — LAPAROTOMY, EXPLORATORY
Anesthesia: General | Site: Abdomen

## 2023-03-08 MED ORDER — SODIUM CHLORIDE 0.9 % IV SOLN: INTRAVENOUS | Status: DC

## 2023-03-08 MED ORDER — PROPOFOL 10 MG/ML IV BOLUS
INTRAVENOUS | Status: DC | PRN
  Administered 2023-03-08: 50 mg via INTRAVENOUS
  Administered 2023-03-08: 170 mg via INTRAVENOUS

## 2023-03-08 MED ORDER — PHENYLEPHRINE 80 MCG/ML (10ML) SYRINGE FOR IV PUSH (FOR BLOOD PRESSURE SUPPORT)
PREFILLED_SYRINGE | INTRAVENOUS | Status: AC
  Filled 2023-03-08: qty 10

## 2023-03-08 MED ORDER — PROPOFOL 10 MG/ML IV BOLUS
INTRAVENOUS | Status: AC
  Filled 2023-03-08: qty 20

## 2023-03-08 MED ORDER — PHENYLEPHRINE HCL-NACL 20-0.9 MG/250ML-% IV SOLN
INTRAVENOUS | Status: DC | PRN
  Administered 2023-03-08: 30 ug/min via INTRAVENOUS

## 2023-03-08 MED ORDER — HYDROMORPHONE HCL 1 MG/ML IJ SOLN
1.0000 mg | INTRAMUSCULAR | Status: AC | PRN
  Administered 2023-03-10 (×2): 1 mg via INTRAVENOUS
  Filled 2023-03-08 (×2): qty 1

## 2023-03-08 MED ORDER — ENOXAPARIN SODIUM 60 MG/0.6ML IJ SOSY
50.0000 mg | PREFILLED_SYRINGE | INTRAMUSCULAR | Status: DC
  Administered 2023-03-09 – 2023-03-14 (×6): 50 mg via SUBCUTANEOUS
  Filled 2023-03-08 (×6): qty 0.6

## 2023-03-08 MED ORDER — DEXAMETHASONE SODIUM PHOSPHATE 10 MG/ML IJ SOLN
INTRAMUSCULAR | Status: DC | PRN
  Administered 2023-03-08: 10 mg via INTRAVENOUS

## 2023-03-08 MED ORDER — SUGAMMADEX SODIUM 200 MG/2ML IV SOLN
INTRAVENOUS | Status: DC | PRN
  Administered 2023-03-08: 200 mg via INTRAVENOUS

## 2023-03-08 MED ORDER — ROCURONIUM BROMIDE 10 MG/ML (PF) SYRINGE
PREFILLED_SYRINGE | INTRAVENOUS | Status: DC | PRN
  Administered 2023-03-08: 10 mg via INTRAVENOUS
  Administered 2023-03-08: 20 mg via INTRAVENOUS
  Administered 2023-03-08: 40 mg via INTRAVENOUS

## 2023-03-08 MED ORDER — HYDROMORPHONE HCL 1 MG/ML IJ SOLN
INTRAMUSCULAR | Status: AC
  Filled 2023-03-08: qty 0.5

## 2023-03-08 MED ORDER — FENTANYL CITRATE (PF) 250 MCG/5ML IJ SOLN
INTRAMUSCULAR | Status: DC | PRN
  Administered 2023-03-08 (×2): 50 ug via INTRAVENOUS
  Administered 2023-03-08: 100 ug via INTRAVENOUS
  Administered 2023-03-08: 50 ug via INTRAVENOUS

## 2023-03-08 MED ORDER — ONDANSETRON HCL 4 MG/2ML IJ SOLN
INTRAMUSCULAR | Status: AC
  Filled 2023-03-08: qty 2

## 2023-03-08 MED ORDER — SODIUM CHLORIDE 0.9 % IV SOLN
INTRAVENOUS | Status: DC | PRN
  Administered 2023-03-08: 2 g via INTRAVENOUS

## 2023-03-08 MED ORDER — LACTATED RINGERS IV SOLN: INTRAVENOUS | Status: DC | PRN

## 2023-03-08 MED ORDER — ROCURONIUM BROMIDE 10 MG/ML (PF) SYRINGE
PREFILLED_SYRINGE | INTRAVENOUS | Status: AC
  Filled 2023-03-08: qty 10

## 2023-03-08 MED ORDER — NALOXONE HCL 0.4 MG/ML IJ SOLN: 0.4000 mg | INTRAMUSCULAR | Status: DC | PRN

## 2023-03-08 MED ORDER — 0.9 % SODIUM CHLORIDE (POUR BTL) OPTIME
TOPICAL | Status: DC | PRN
  Administered 2023-03-08: 1000 mL

## 2023-03-08 MED ORDER — HYDROMORPHONE 1 MG/ML IV SOLN
INTRAVENOUS | Status: DC
  Administered 2023-03-08: 30 mg via INTRAVENOUS
  Administered 2023-03-09: 0.6 mg via INTRAVENOUS
  Administered 2023-03-10: 0.3 mg via INTRAVENOUS
  Filled 2023-03-08: qty 30

## 2023-03-08 MED ORDER — CHLORHEXIDINE GLUCONATE 0.12 % MT SOLN
OROMUCOSAL | Status: AC
  Administered 2023-03-08: 15 mL via OROMUCOSAL
  Filled 2023-03-08: qty 15

## 2023-03-08 MED ORDER — ORAL CARE MOUTH RINSE: 15.0000 mL | Freq: Once | OROMUCOSAL | Status: AC

## 2023-03-08 MED ORDER — DIPHENHYDRAMINE HCL 12.5 MG/5ML PO ELIX: 12.5000 mg | ORAL_SOLUTION | Freq: Four times a day (QID) | ORAL | Status: DC | PRN

## 2023-03-08 MED ORDER — ONDANSETRON HCL 4 MG/2ML IJ SOLN
4.0000 mg | Freq: Four times a day (QID) | INTRAMUSCULAR | Status: DC | PRN
  Administered 2023-03-09 (×2): 4 mg via INTRAVENOUS

## 2023-03-08 MED ORDER — CHLORHEXIDINE GLUCONATE 0.12 % MT SOLN: 15.0000 mL | Freq: Once | OROMUCOSAL | Status: AC

## 2023-03-08 MED ORDER — LACTATED RINGERS IV SOLN: INTRAVENOUS | Status: DC

## 2023-03-08 MED ORDER — SODIUM CHLORIDE 0.9% FLUSH: 9.0000 mL | INTRAVENOUS | Status: DC | PRN

## 2023-03-08 MED ORDER — SODIUM CHLORIDE 0.9 % IV SOLN
INTRAVENOUS | Status: AC
  Filled 2023-03-08: qty 2

## 2023-03-08 MED ORDER — DEXAMETHASONE SODIUM PHOSPHATE 10 MG/ML IJ SOLN
INTRAMUSCULAR | Status: AC
  Filled 2023-03-08: qty 1

## 2023-03-08 MED ORDER — PHENYLEPHRINE 80 MCG/ML (10ML) SYRINGE FOR IV PUSH (FOR BLOOD PRESSURE SUPPORT)
PREFILLED_SYRINGE | INTRAVENOUS | Status: DC | PRN
  Administered 2023-03-08: 40 ug via INTRAVENOUS

## 2023-03-08 MED ORDER — BUPIVACAINE LIPOSOME 1.3 % IJ SUSP
INTRAMUSCULAR | Status: DC | PRN
  Administered 2023-03-08: 20 mL

## 2023-03-08 MED ORDER — BUPIVACAINE LIPOSOME 1.3 % IJ SUSP
INTRAMUSCULAR | Status: AC
  Filled 2023-03-08: qty 20

## 2023-03-08 MED ORDER — HEMOSTATIC AGENTS (NO CHARGE) OPTIME
TOPICAL | Status: DC | PRN
  Administered 2023-03-08: 1 via TOPICAL

## 2023-03-08 MED ORDER — FENTANYL CITRATE (PF) 250 MCG/5ML IJ SOLN
INTRAMUSCULAR | Status: AC
  Filled 2023-03-08: qty 5

## 2023-03-08 MED ORDER — LIDOCAINE 2% (20 MG/ML) 5 ML SYRINGE
INTRAMUSCULAR | Status: AC
  Filled 2023-03-08: qty 5

## 2023-03-08 MED ORDER — DIPHENHYDRAMINE HCL 50 MG/ML IJ SOLN: 12.5000 mg | Freq: Four times a day (QID) | INTRAMUSCULAR | Status: DC | PRN

## 2023-03-08 MED ORDER — HYDROMORPHONE HCL 1 MG/ML IJ SOLN
INTRAMUSCULAR | Status: DC | PRN
Start: 2023-03-08 — End: 2023-03-08
  Administered 2023-03-08: .5 mg via INTRAVENOUS

## 2023-03-08 MED ORDER — SUCCINYLCHOLINE CHLORIDE 200 MG/10ML IV SOSY
PREFILLED_SYRINGE | INTRAVENOUS | Status: DC | PRN
  Administered 2023-03-08: 120 mg via INTRAVENOUS

## 2023-03-08 MED ORDER — ACETAMINOPHEN 10 MG/ML IV SOLN
1000.0000 mg | Freq: Four times a day (QID) | INTRAVENOUS | Status: AC
  Administered 2023-03-08 – 2023-03-09 (×4): 1000 mg via INTRAVENOUS
  Filled 2023-03-08 (×4): qty 100

## 2023-03-08 SURGICAL SUPPLY — 56 items
APL PRP STRL LF DISP 70% ISPRP (MISCELLANEOUS) ×2
BAG COUNTER SPONGE SURGICOUNT (BAG) ×2 IMPLANT
BAG SPNG CNTER NS LX DISP (BAG)
BLADE CLIPPER SURG (BLADE) IMPLANT
CANISTER SUCT 3000ML PPV (MISCELLANEOUS) ×2 IMPLANT
CHLORAPREP W/TINT 26 (MISCELLANEOUS) ×2 IMPLANT
CLIP TI WIDE RED SMALL 6 (CLIP) IMPLANT
COVER SURGICAL LIGHT HANDLE (MISCELLANEOUS) ×2 IMPLANT
DRAPE LAPAROSCOPIC ABDOMINAL (DRAPES) ×2 IMPLANT
DRAPE WARM FLUID 44X44 (DRAPES) ×2 IMPLANT
DRSG OPSITE POSTOP 4X10 (GAUZE/BANDAGES/DRESSINGS) IMPLANT
DRSG OPSITE POSTOP 4X8 (GAUZE/BANDAGES/DRESSINGS) IMPLANT
ELECT BLADE 4.0 EZ CLEAN MEGAD (MISCELLANEOUS) ×2
ELECT BLADE 6.5 EXT (BLADE) IMPLANT
ELECT CAUTERY BLADE 6.4 (BLADE) ×2 IMPLANT
ELECT REM PT RETURN 9FT ADLT (ELECTROSURGICAL) ×2
ELECTRODE BLDE 4.0 EZ CLN MEGD (MISCELLANEOUS) IMPLANT
ELECTRODE REM PT RTRN 9FT ADLT (ELECTROSURGICAL) ×2 IMPLANT
GLOVE BIO SURGEON STRL SZ8 (GLOVE) ×2 IMPLANT
GLOVE BIOGEL PI IND STRL 8 (GLOVE) ×2 IMPLANT
GOWN STRL REUS W/ TWL LRG LVL3 (GOWN DISPOSABLE) ×2 IMPLANT
GOWN STRL REUS W/ TWL XL LVL3 (GOWN DISPOSABLE) ×2 IMPLANT
GOWN STRL REUS W/TWL LRG LVL3 (GOWN DISPOSABLE) ×6
GOWN STRL REUS W/TWL XL LVL3 (GOWN DISPOSABLE) ×4
HANDLE SUCTION POOLE (INSTRUMENTS) ×2 IMPLANT
HEMOSTAT ARISTA ABSORB 3G PWDR (HEMOSTASIS) IMPLANT
KIT BASIN OR (CUSTOM PROCEDURE TRAY) ×2 IMPLANT
KIT OSTOMY DRAINABLE 2.75 STR (WOUND CARE) IMPLANT
KIT TURNOVER KIT B (KITS) ×2 IMPLANT
LIGASURE IMPACT 36 18CM CVD LR (INSTRUMENTS) IMPLANT
NDL HYPO 25GX1X1/2 BEV (NEEDLE) IMPLANT
NEEDLE HYPO 25GX1X1/2 BEV (NEEDLE) ×2 IMPLANT
NS IRRIG 1000ML POUR BTL (IV SOLUTION) ×4 IMPLANT
PACK GENERAL/GYN (CUSTOM PROCEDURE TRAY) ×2 IMPLANT
PAD ARMBOARD 7.5X6 YLW CONV (MISCELLANEOUS) ×2 IMPLANT
PENCIL SMOKE EVACUATOR (MISCELLANEOUS) ×2 IMPLANT
RELOAD PROXIMATE 75MM BLUE (ENDOMECHANICALS) ×6 IMPLANT
RELOAD STAPLE 75 3.8 BLU REG (ENDOMECHANICALS) IMPLANT
SPECIMEN JAR LARGE (MISCELLANEOUS) IMPLANT
SPONGE T-LAP 18X18 ~~LOC~~+RFID (SPONGE) IMPLANT
STAPLER CVD CUT BL 40 RELOAD (ENDOMECHANICALS) ×2 IMPLANT
STAPLER CVD CUT BLU 40 RELOAD (ENDOMECHANICALS) IMPLANT
STAPLER GUN LINEAR PROX 60 (STAPLE) IMPLANT
STAPLER PROXIMATE 75MM BLUE (STAPLE) IMPLANT
STAPLER VISISTAT 35W (STAPLE) ×2 IMPLANT
SUCTION POOLE HANDLE (INSTRUMENTS) ×2
SUT PDS AB 1 TP1 96 (SUTURE) ×4 IMPLANT
SUT VIC AB 2-0 SH 18 (SUTURE) ×2 IMPLANT
SUT VIC AB 3-0 SH 18 (SUTURE) ×2 IMPLANT
SUT VICRYL AB 2 0 TIES (SUTURE) ×2 IMPLANT
SUT VICRYL AB 3 0 TIES (SUTURE) ×2 IMPLANT
SYR CONTROL 10ML LL (SYRINGE) IMPLANT
TOWEL GREEN STERILE (TOWEL DISPOSABLE) ×2 IMPLANT
TOWEL GREEN STERILE FF (TOWEL DISPOSABLE) ×2 IMPLANT
TRAY FOLEY MTR SLVR 16FR STAT (SET/KITS/TRAYS/PACK) IMPLANT
YANKAUER SUCT BULB TIP NO VENT (SUCTIONS) IMPLANT

## 2023-03-08 NOTE — TOC Progression Note (Signed)
Transition of Care [facility]) - Progression Note    Patient Details  Name: Cindy Holmes MRN: [id number] Date of Birth: 05-06-1951  [facility] CM/SW Contact  Tom-Johnson, Hershal Coria, RN Phone Number: 03/08/2023, 1:48 PM  Clinical Narrative:     Patient undergoing Exploratory Laparotomy with Colectomy/Colostomy and Small Bowel Resection today 03/08/23 with the diagnosis of Sigmoid Colon Stricture.    CM will continue to follow as patient progresses with care for discharge needs.         Expected Discharge Plan and Services                                               Social Determinants of Health (SDOH) Interventions SDOH Screenings   Food Insecurity: No Food Insecurity (03/05/2023)  Housing: Low Risk  (03/05/2023)  Transportation Needs: No Transportation Needs (03/05/2023)  Utilities: Not At Risk (03/05/2023)  Tobacco Use: Low Risk  (03/08/2023)    Readmission Risk Interventions    03/03/2023    3:06 PM 02/03/2023    3:58 PM  Readmission Risk Prevention Plan  Transportation Screening Complete Complete  PCP or Specialist Appt within 3-5 Days Complete Complete  HRI or Home Care Consult Complete Complete  Social Work Consult for Recovery Care Planning/Counseling Complete Complete  Palliative Care Screening Not Applicable Not Applicable  Medication Review Oceanographer) Referral to Pharmacy Referral to Pharmacy

## 2023-03-08 NOTE — Op Note (Signed)
Diagnosis: Sigmoid colon stricture  Postoperative diagnosis: Same  Procedure: Sigmoid colectomy with Hartman's procedure and end colostomy, small bowel resection  Surgeon: Harriette Bouillon MD  Assistant: Carlena Bjornstad PA-C   Anesthesia: General With 20 cc of Exparel local  Drains: None  Specimen: Sigmoid colon with stricture, distal ileum small bowel segment  EBL: 100 cc  Indications for procedure: The patient is a 72 year old female with rheumatoid arthritis on immunosuppression with a sigmoid colon stricture.  She has had a chronic large bowel obstruction for 4 months with failure to thrive, abdominal pain and unable to eat consistently.  She underwent endoscopy which showed a benign stricture on biopsy but this was about 5 cm in the sigmoid colon.  Given her continued pain and progressive symptoms, resection recommended.  Recommend colostomy since she was not prepped and is immunocompromise.  Risks and benefits of surgery reviewed as well as other other options.  Operative and nonoperative means were explored.The procedure has been discussed with the patient. The risks,  benefits and alternatives to surgery have been discussed. The patient understands the reason for the procedure and agrees to proceed.  Risk of bleeding, infection, injury to nearby structures, injury to blood vessels, injury to the ureter, injury to bowel, bladder, uterus, DVT, wound infection, hernia, and the need for colostomy reviewed with the patient today.  Reviewed potential risk of worsening of underlying medical problems.  Did not feel this would be amendable to a minimally invasive approach.  After review of the above she was to proceed with surgery.  All questions are answered today to the best of my ability.    Description of procedure: The patient was met in the holding area questions were answered.  She is brought to the operating room placed supine upon the operating table.  After induction of general  anesthesia, orogastric tube and Foley catheter were placed.  The abdomen was then prepped and draped in sterile fashion.  Timeout performed.  Of note she was marked by the wound ostomy care nurse.  A lower midline incision was used.  Dissection was carried down to the fascia the.  The fascia was opened the midline the abdominal cavity was entered.  She was placed in Trendelenburg position.  Retractor was placed small bowel was retracted on the field.  She had a very narrow and deep pelvis.  I could palpate the stricture of the sigmoid colon proximal to the rectum.  This was about 5 cm.  Of note this was densely adherent to the lateral pelvic sidewall and the ileum.  A segment of ileum was dense adherent to it and this appeared to be a potential fistula.  The left colon was then mobilized.  This was carefully done.  A window was created in the mesentery and a GIA 75 stapler using fire proximal to the stricture.  The stricture appeared to be more and likely related to diverticulitis.  I was able to mobilize carefully from the left pelvic sidewall preserving the ureter.  This was done to a location distal to it at the distal sigmoid colon.  This was divided with a contour stapler using a blue load.  The LigaSure was used to take down the mesentery carefully.  Good hemostasis was achieved.  Small vessels were controlled with clips.  The pelvis was irrigated.  There was a segment of small bowel that was dissected away from the area of inflammation.  This was resected.  This was about a 5 cm length of  terminal ileum.  Was about 15 cm from the ileocecal valve.  GIA 75 stapler is used to divide proximal and distal of this.  A side-to-side functional end and anastomosis was created using a GIA 75 blue load.  A TX exceeds used to close the common enterotomy.  Mesenteric defect closed with 2-0 Vicryl.  Manage the small bowel is normal.  There are no other abnormalities that could be seen with the intra-abdominal viscera.   Irrigation was used.  Arista was placed into the pelvis due to some oozing of the deep pelvic venous plexus controlled with Arista well.  We then created the left lower quadrant colostomy site.  Ellipse of skin taken with cautery.  Dissection was carried down to the rectus muscle.  It was split in a cruciate fashion.  A Babcock was placed through and the colon was grasped.  Of note we did do extensive mobilization of the left colon requiring a bigger incision prior to this.  This pulled up nicely and we had ample colon given her obesity.  We then reassess for bleeding and there was good hemostasis.  Exparel was injected 20 cc into both rectus sheaths.  We then closed the fascia with double-stranded PDS.  Skin staples used to close skin.  Colostomy matured with 3-0 Vicryl.  Appliance placed.  Honeycomb dressing placed over the incision.  All counts were found to be correct.  The patient was then awoke extubated taken recovery in satisfactory condition.

## 2023-03-08 NOTE — Transfer of Care (Signed)
Immediate Anesthesia Transfer of Care Note  Patient: Cindy Holmes  Procedure(s) Performed: EXPLORATION LAPAROTOMY (Abdomen) COLECTOMY WITH COLOSTOMY CREATION/HARTMANN PROCEDURE (Abdomen) SMALL BOWEL RESECTION (Abdomen)  Patient Location: PACU  Anesthesia Type:General  Level of Consciousness: awake, drowsy, and patient cooperative  Airway & Oxygen Therapy: Patient Spontanous Breathing and Patient connected to face mask oxygen  Post-op Assessment: Report given to RN and Post -op Vital signs reviewed and stable  Post vital signs: Reviewed and stable  Last Vitals:  Vitals Value Taken Time  BP 116/57 03/08/23 1445  Temp    Pulse 100 03/08/23 1447  Resp 20 03/08/23 1447  SpO2 94 % 03/08/23 1447  Vitals shown include unfiled device data.  Last Pain:  Vitals:   03/08/23 1030  TempSrc:   PainSc: 0-No pain      Patients Stated Pain Goal: 0 (03/03/23 2020)  Complications: There were no known notable events for this encounter.

## 2023-03-08 NOTE — Progress Notes (Signed)
[facility]                                                                               Cindy Holmes, is a 72 y.o. female, DOB - [birth year], NWG:[id number] Admit date - 03/02/2023    Outpatient Primary MD for the patient is Cindy Nevins, MD  LOS - 6  days    Brief summary    Cindy Holmes is a 72 y.o. female with medical history significant of hypertension, rheumatoid arthritis on immunosuppressives, CAD, chronic HFpEF, and history of CVA who presents with persistent diarrhea, nausea and vomiting.  She underwent  colonoscopy by Dr Elnoria Howard and was found to have benign- appearing, intrinsic moderate stenosis measuring 5 cm ( in length) was found in the sigmoid colon . General surgery consulted and she is scheduled for colectomy and possible colostomy tomorrow. Cardiology consulted for pre op clearance and for diastolic CHF/ Pulmonary hypertension.  Assessment & Plan    Assessment and Plan:  Overflow diarrhea: -several months of persistent symptoms with PCR positive for enteropathogenic E. Coli during recent hospitalization.  Symptoms persistented after completion of antibiotics.  - c diff pcr and toxin negative. Repeat GI panel is negative.  -GI consulted , she underwent colonoscopy showing sigmoid colon narrowing, benign- appearing, intrinsic moderate stenosis measuring 5 cm ( in length) was found in the sigmoid colon General surgery consulted for recommendations.   She will probably need ex lap and resection with colostomy scheduled later today.   * AKI (acute kidney injury) (HCC) Initially thought secondary to diarrhea and poor oral intake, sightly improved to 1.2 but creatinine back at 1.4.  Obesity, Class III, BMI 40-49.9 (morbid obesity) (HCC) BMI of 40  PAF (paroxysmal atrial fibrillation) (HCC) Eliquis on hold for the procedure.  Metoprolol held for hypotension.   Rheumatoid arthritis (HCC) -pt is on methotrexate and plaquenil which are on hold for  now for the procedure.   Hyperlipidemia -Continue statin  Essential hypertension BP parameters are on the softer side.  Midodrine added with improvement in BP parameters.   Acute chronic diastolic CHF/ Pulmonary hypertension:  Bilateral leg edema, vascular congestion on CXR, elevated bnp Echocardiogram this admit showing LVEF of 65 to 70% , no RWMA, severely elevated pulmonary artery systolic pressures, The estimated right ventricular systolic pressure is 61.5 mmHg .  Cardiology consulted for recommendations.  Started patient on lasix 20 mg BID.  Continue with strict intake and output.   Hypokalemia And Hypomagnesemia Replaced.    Mild hyponatremia Sodium of 131 today.  Suspect a component of fluid overload.  TSH wnl. Urine sodium less than 10.  Serum osmo is 282.   Anemia of chronic disease:  Hemoglobin around 8. Continue to monitor.    Metabolic acidosis On sodium bicarb tablets.     Body mass index is 38.97 kg/m. Obesity.   Estimated body mass index is 38.97 kg/m as calculated from the following:   Height as of this encounter: 5\' 3"  (1.6 m).   Weight as of this encounter: 99.8 kg.  Code Status: full code.  DVT Prophylaxis:  SCD's Start: 03/06/23 1038/ Lovenox.    Level of Care: Level of care:  Telemetry Medical Family Communication: none at bedside.   Disposition Plan:     Remains inpatient appropriate:  pending colectomy tomorrow.   Procedures:  CT abd and pelvis.  Echocardiogram.   Consultants:   Gastroenterology Dr Elnoria Howard.  General surgery.   Antimicrobials:   Anti-infectives (From admission, onward)    Start     Dose/Rate Route Frequency Ordered Stop   03/07/23 0600  cefoTEtan (CEFOTAN) 2 g in sodium chloride 0.9 % 100 mL IVPB        2 g 200 mL/hr over 30 Minutes Intravenous On call to O.R. 03/06/23 1037 03/08/23 0559   03/06/23 1400  neomycin (MYCIFRADIN) tablet 1,000 mg       Placed in "And" Linked Group   1,000 mg Oral 3 times per day  03/06/23 1037 03/06/23 2243   03/06/23 1400  metroNIDAZOLE (FLAGYL) tablet 1,000 mg       Placed in "And" Linked Group   1,000 mg Oral 3 times per day 03/06/23 1037 03/07/23 1359   03/02/23 2000  azithromycin (ZITHROMAX) 500 mg in sodium chloride 0.9 % 250 mL IVPB  Status:  Discontinued        500 mg 250 mL/hr over 60 Minutes Intravenous Every 24 hours 03/02/23 1959 03/04/23 1745        Medications  Scheduled Meds:  [MAR Hold] allopurinol  400 mg Oral QHS   [MAR Hold] aspirin EC  81 mg Oral QHS   [MAR Hold] atorvastatin  80 mg Oral Daily   chlorhexidine  15 mL Mouth/Throat Once   Or   mouth rinse  15 mL Mouth Rinse Once   [MAR Hold] enoxaparin (LOVENOX) injection  50 mg Subcutaneous Q24H   [MAR Hold] ezetimibe  10 mg Oral Daily   [MAR Hold] furosemide  20 mg Oral BID   [MAR Hold] midodrine  10 mg Oral TID WC   [MAR Hold] mupirocin ointment  1 Application Nasal BID   [MAR Hold] polyethylene glycol  17 g Oral BID   [MAR Hold] sodium bicarbonate  650 mg Oral BID   [MAR Hold] zolpidem  5 mg Oral QHS   Continuous Infusions:  sodium chloride Stopped (03/08/23 1014)   lactated ringers      PRN Meds:.[MAR Hold] bisacodyl, [MAR Hold] ipratropium-albuterol, [MAR Hold] ondansetron (ZOFRAN) IV    Subjective:   Cindy Holmes was seen and examined today.  No new complaints. No chest pain.   Objective:   Vitals:   03/07/23 1601 03/08/23 0430 03/08/23 0819 03/08/23 1018  BP: (!) 102/48 (!) 111/50 (!) 111/48 (!) 112/53  Pulse: 92 88 88 (!) 105  Resp: 18  19 18   Temp: 98.1 F (36.7 C) 98 F (36.7 C) 98.7 F (37.1 C) 98.2 F (36.8 C)  TempSrc:    Oral  SpO2: 97% 97% 97% 100%  Weight:    99.8 kg  Height:    5\' 3"  (1.6 m)    Intake/Output Summary (Last 24 hours) at 03/08/2023 1021 Last data filed at 03/08/2023 0500 Gross per 24 hour  Intake 1469.56 ml  Output 0 ml  Net 1469.56 ml   Filed Weights   03/02/23 1115 03/08/23 1018  Weight: 104.3 kg 99.8 kg      Exam General exam: Appears calm and comfortable  Respiratory system: diminished air entry. Scattered wheezing heard.  Cardiovascular system: S1 & S2 heard, RRR. No JVD, murmurs, Gastrointestinal system: Abdomen is nondistended, soft and nontender. Central nervous system: Alert and oriented. Extremities: bilateral leg edema.  Skin: No rashes,  Psychiatry: Mood & affect appropriate.          Data Reviewed:  I have personally reviewed following labs and imaging studies   CBC Lab Results  Component Value Date   WBC 4.4 03/06/2023   RBC 2.94 (L) 03/06/2023   HGB 8.9 (L) 03/06/2023   HCT 27.6 (L) 03/06/2023   MCV 93.9 03/06/2023   MCH 30.3 03/06/2023   PLT 241 03/06/2023   MCHC 32.2 03/06/2023   RDW 17.9 (H) 03/06/2023   LYMPHSABS 0.8 03/06/2023   MONOABS 0.2 03/06/2023   EOSABS 0.0 03/06/2023   BASOSABS 0.0 03/06/2023     Last metabolic panel Lab Results  Component Value Date   NA 131 (L) 03/07/2023   K 3.5 03/07/2023   CL 103 03/07/2023   CO2 19 (L) 03/07/2023   BUN 21 03/07/2023   CREATININE 1.46 (H) 03/07/2023   GLUCOSE 93 03/07/2023   GFRNONAA 38 (L) 03/07/2023   GFRAA >60 12/11/2018   CALCIUM 7.7 (L) 03/07/2023   PROT 4.9 (L) 03/06/2023   ALBUMIN 2.2 (L) 03/06/2023   LABGLOB 1.9 08/13/2022   AGRATIO 2.3 (H) 08/13/2022   BILITOT 0.5 03/06/2023   ALKPHOS 52 03/06/2023   AST 12 (L) 03/06/2023   ALT 9 03/06/2023   ANIONGAP 9 03/07/2023    CBG (last 3)  No results for input(s): "GLUCAP" in the last 72 hours.    Coagulation Profile: No results for input(s): "INR", "PROTIME" in the last 168 hours.   Radiology Studies: ECHOCARDIOGRAM COMPLETE  Result Date: 03/06/2023    ECHOCARDIOGRAM REPORT   Patient Name:   Cindy Holmes [facility] Date of Exam: 03/06/2023 Medical Rec #:  [id number]       Height:       63.0 in Accession #:    [id number]      Weight:       229.9 lb Date of Birth:  November 08, 1950      BSA:          2.052 m Patient Age:    71 years         BP:           119/52 mmHg Patient Gender: F               HR:           79 bpm. Exam Location:  Inpatient Procedure: 2D Echo, Color Doppler and Cardiac Doppler Indications:    fluid overload  History:        Patient has prior history of Echocardiogram examinations, most                 recent 10/27/2020. CAD; Risk Factors:Hypertension and                 Dyslipidemia.  Sonographer:    Delcie Roch RDCS Referring Phys: Nilda Calamity Ella Golomb IMPRESSIONS  1. Left ventricular ejection fraction, by estimation, is 65 to 70%. The left ventricle has normal function. The left ventricle has no regional wall motion abnormalities. Left ventricular diastolic parameters are consistent with Grade II diastolic dysfunction (pseudonormalization).  2. Right ventricular systolic function is normal. The right ventricular size is dilated. There is severely elevated pulmonary artery systolic pressure. The estimated right ventricular systolic pressure is 61.5 mmHg.  3. Left atrial size was mildly dilated.  4. The mitral valve is normal in structure. Mild to moderate mitral valve regurgitation.  5. Multiple jets. Tricuspid valve regurgitation is mild to moderate.  6.  The aortic valve is tricuspid. Aortic valve regurgitation is not visualized. No aortic stenosis is present.  7. The inferior vena cava is dilated in size with <50% respiratory variability, suggesting right atrial pressure of 15 mmHg. Comparison(s): Prior images reviewed side by side. RV is dilated compared to prior on 4 chamber live views. RVSP has increased. FINDINGS  Left Ventricle: Left ventricular ejection fraction, by estimation, is 65 to 70%. The left ventricle has normal function. The left ventricle has no regional wall motion abnormalities. The left ventricular internal cavity size was normal in size. There is  no left ventricular hypertrophy. Left ventricular diastolic parameters are consistent with Grade II diastolic dysfunction (pseudonormalization). Right Ventricle:  The right ventricular size is dilated. No increase in right ventricular wall thickness. Right ventricular systolic function is normal. There is severely elevated pulmonary artery systolic pressure. The tricuspid regurgitant velocity is 3.41 m/s, and with an assumed right atrial pressure of 15 mmHg, the estimated right ventricular systolic pressure is 61.5 mmHg. Left Atrium: Left atrial size was mildly dilated. Right Atrium: Right atrial size was normal in size. Pericardium: Trivial pericardial effusion is present. The pericardial effusion is circumferential. Presence of epicardial fat layer. Mitral Valve: The mitral valve is normal in structure. Mild to moderate mitral valve regurgitation. Tricuspid Valve: Multiple jets. The tricuspid valve is normal in structure. Tricuspid valve regurgitation is mild to moderate. Aortic Valve: The aortic valve is tricuspid. Aortic valve regurgitation is not visualized. No aortic stenosis is present. Pulmonic Valve: The pulmonic valve was normal in structure. Pulmonic valve regurgitation is trivial. No evidence of pulmonic stenosis. Aorta: The aortic root, ascending aorta and aortic arch are all structurally normal, with no evidence of dilitation or obstruction. Venous: The inferior vena cava is dilated in size with less than 50% respiratory variability, suggesting right atrial pressure of 15 mmHg. IAS/Shunts: The atrial septum is grossly normal.  LEFT VENTRICLE PLAX 2D LVIDd:         5.20 cm   Diastology LVIDs:         3.00 cm   LV e' medial:    6.85 cm/s LV PW:         0.80 cm   LV E/e' medial:  21.6 LV IVS:        0.90 cm   LV e' lateral:   7.62 cm/s LVOT diam:     1.80 cm   LV E/e' lateral: 19.4 LV SV:         61 LV SV Index:   30 LVOT Area:     2.54 cm  RIGHT VENTRICLE             IVC RV Basal diam:  2.60 cm     IVC diam: 2.40 cm RV S prime:     11.70 cm/s TAPSE (M-mode): 1.9 cm LEFT ATRIUM             Index        RIGHT ATRIUM           Index LA diam:        4.40 cm 2.14  cm/m   RA Area:     13.70 cm LA Vol (A2C):   75.2 ml 36.65 ml/m  RA Volume:   30.00 ml  14.62 ml/m LA Vol (A4C):   62.0 ml 30.22 ml/m LA Biplane Vol: 69.1 ml 33.68 ml/m  AORTIC VALVE LVOT Vmax:   128.00 cm/s LVOT Vmean:  83.400 cm/s LVOT VTI:    0.239 m  AORTA Ao Root diam: 3.00 cm Ao Asc diam:  3.40 cm MITRAL VALVE                  TRICUSPID VALVE MV Area (PHT): 4.15 cm       TR Peak grad:   46.5 mmHg MV Decel Time: 183 msec       TR Vmax:        341.00 cm/s MR Peak grad:    82.8 mmHg MR Mean grad:    59.0 mmHg    SHUNTS MR Vmax:         455.00 cm/s  Systemic VTI:  0.24 m MR Vmean:        364.0 cm/s   Systemic Diam: 1.80 cm MR PISA:         0.57 cm MR PISA Eff ROA: 5 mm MR PISA Radius:  0.30 cm MV E velocity: 148.00 cm/s MV A velocity: 77.10 cm/s MV E/A ratio:  1.92 Riley Lam MD Electronically signed by Riley Lam MD Signature Date/Time: 03/06/2023/1:55:10 PM    Final        Kathlen Mody M.D. [facility] 03/08/2023, 10:21 AM  Available via Epic secure chat 7am-7pm After 7 pm, please refer to night coverage provider listed on amion.

## 2023-03-08 NOTE — Anesthesia Preprocedure Evaluation (Signed)
Anesthesia Evaluation  Patient identified by MRN, date of birth, ID band Patient awake    Reviewed: Allergy & Precautions, NPO status , Patient's Chart, lab work & pertinent test results  History of Anesthesia Complications (+) PONV and history of anesthetic complications  Airway Mallampati: III  TM Distance: <3 FB Neck ROM: Full    Dental  (+) Dental Advisory Given   Pulmonary neg pulmonary ROS   breath sounds clear to auscultation       Cardiovascular hypertension, Pt. on medications + CAD, + Cardiac Stents and +CHF   Rhythm:Regular Rate:Normal     Neuro/Psych CVA    GI/Hepatic negative GI ROS, Neg liver ROS,,,  Endo/Other  negative endocrine ROS    Renal/GU CRFRenal disease     Musculoskeletal  (+) Arthritis ,    Abdominal   Peds  Hematology  (+) Blood dyscrasia, anemia   Anesthesia Other Findings   Reproductive/Obstetrics                             Anesthesia Physical Anesthesia Plan  ASA: 3  Anesthesia Plan: General   Post-op Pain Management: Tylenol PO (pre-op)* and Ketamine IV*   Induction:   PONV Risk Score and Plan: 4 or greater and Propofol infusion, Dexamethasone, Ondansetron and Treatment may vary due to age or medical condition  Airway Management Planned: Oral ETT  Additional Equipment:   Intra-op Plan:   Post-operative Plan: Extubation in OR and Possible Post-op intubation/ventilation  Informed Consent: I have reviewed the patients History and Physical, chart, labs and discussed the procedure including the risks, benefits and alternatives for the proposed anesthesia with the patient or authorized representative who has indicated his/her understanding and acceptance.     Dental advisory given  Plan Discussed with: CRNA  Anesthesia Plan Comments:        Anesthesia Quick Evaluation

## 2023-03-08 NOTE — Progress Notes (Signed)
PT Cancellation Note  Patient Details Name: Cindy Holmes MRN: [id number] DOB: 06/15/1951   Cancelled Treatment:    Reason Eval/Treat Not Completed: Patient at procedure or test/unavailable. Pt off unit for surgery. PT will follow up as time allows.   Arlyss Gandy 03/08/2023, 10:47 AM

## 2023-03-08 NOTE — Interval H&P Note (Signed)
History and Physical Interval Note:  03/08/2023 12:02 PM  Cindy Holmes  has presented today for surgery, with the diagnosis of colon stricture.  The various methods of treatment have been discussed with the patient and family. After consideration of risks, benefits and other options for treatment, the patient has consented to  Procedure(s): EXPLORATION LAPAROTOMY WITH COLECTOMY/ COLOSTOMY (N/A) as a surgical intervention.  The patient's history has been reviewed, patient examined, no change in status, stable for surgery.  I have reviewed the patient's chart and labs.  Questions were answered to the patient's satisfaction.     Jakyri Brunkhorst A Danne Scardina

## 2023-03-08 NOTE — Anesthesia Procedure Notes (Signed)
Procedure Name: Intubation Date/Time: 03/08/2023 12:21 PM  Performed by: Marquis Buggy, CRNAPre-anesthesia Checklist: Patient identified, Emergency Drugs available, Suction available and Patient being monitored Patient Re-evaluated:Patient Re-evaluated prior to induction Oxygen Delivery Method: Circle system utilized Preoxygenation: Pre-oxygenation with 100% oxygen Induction Type: IV induction and Rapid sequence Laryngoscope Size: Glidescope and 3 Grade View: Grade I Tube type: Oral Tube size: 7.0 mm Number of attempts: 1 Airway Equipment and Method: Stylet Placement Confirmation: ETT inserted through vocal cords under direct vision, positive ETCO2 and breath sounds checked- equal and bilateral Secured at: 21 cm Tube secured with: Tape Dental Injury: Teeth and Oropharynx as per pre-operative assessment  Difficulty Due To: Difficult Airway- due to limited oral opening and Difficult Airway- due to reduced neck mobility Future Recommendations: Recommend- induction with short-acting agent, and alternative techniques readily available Comments: By Stephani Police, SRNA

## 2023-03-08 NOTE — Progress Notes (Addendum)
Patient Name: Cindy Holmes Date of Encounter: 03/08/2023 [facility] Cardiologist: Marjo Bicker, MD   Interval Summary  .    72 y.o. female hx of CAD with hx of BMS to LAD and PTCA to D1 in 2001, repeat PTCA to D1 in 2002, HFpEF, RA, lupus on chronic immunosuppression, HTN, prior CVA, a fib, HLD, HTN and seen pre-op for segmental colonic resection of the stricture today at 1230 or so..   Today no chest pain, no SOB + edema   Vital Signs .    Vitals:   03/07/23 0525 03/07/23 0853 03/07/23 1601 03/08/23 0430  BP: (!) 117/47 (!) 108/48 (!) 102/48 (!) 111/50  Pulse: 88 94 92 88  Resp:  17 18   Temp: 98.6 F (37 C) 98.8 F (37.1 C) 98.1 F (36.7 C) 98 F (36.7 C)  TempSrc:      SpO2: 100% 97% 97% 97%  Weight:      Height:        Intake/Output Summary (Last 24 hours) at 03/08/2023 0810 Last data filed at 03/08/2023 0500 Gross per 24 hour  Intake 1469.56 ml  Output 0 ml  Net 1469.56 ml      03/02/2023   11:15 AM 02/02/2023    1:56 PM 08/25/2022   10:10 AM  Last 3 Weights  Weight (lbs) 229 lb 15 oz 230 lb 225 lb  Weight (kg) 104.3 kg 104.327 kg 102.059 kg      Telemetry/ECG    SR with rare PVC and occ PACs - Personally Reviewed  ECHO 03/06/23 IMPRESSIONS     1. Left ventricular ejection fraction, by estimation, is 65 to 70%. The  left ventricle has normal function. The left ventricle has no regional  wall motion abnormalities. Left ventricular diastolic parameters are  consistent with Grade II diastolic  dysfunction (pseudonormalization).   2. Right ventricular systolic function is normal. The right ventricular  size is dilated. There is severely elevated pulmonary artery systolic  pressure. The estimated right ventricular systolic pressure is 61.5 mmHg.   3. Left atrial size was mildly dilated.   4. The mitral valve is normal in structure. Mild to moderate mitral valve  regurgitation.   5. Multiple jets. Tricuspid valve regurgitation is mild  to moderate.   6. The aortic valve is tricuspid. Aortic valve regurgitation is not  visualized. No aortic stenosis is present.   7. The inferior vena cava is dilated in size with <50% respiratory  variability, suggesting right atrial pressure of 15 mmHg.   Comparison(s): Prior images reviewed side by side. RV is dilated compared  to prior on 4 chamber live views. RVSP has increased.   FINDINGS   Left Ventricle: Left ventricular ejection fraction, by estimation, is 65  to 70%. The left ventricle has normal function. The left ventricle has no  regional wall motion abnormalities. The left ventricular internal cavity  size was normal in size. There is   no left ventricular hypertrophy. Left ventricular diastolic parameters  are consistent with Grade II diastolic dysfunction (pseudonormalization).   Right Ventricle: The right ventricular size is dilated. No increase in  right ventricular wall thickness. Right ventricular systolic function is  normal. There is severely elevated pulmonary artery systolic pressure. The  tricuspid regurgitant velocity is  3.41 m/s, and with an assumed right atrial pressure of 15 mmHg, the  estimated right ventricular systolic pressure is 61.5 mmHg.   Left Atrium: Left atrial size was mildly dilated.   Right  Atrium: Right atrial size was normal in size.   Pericardium: Trivial pericardial effusion is present. The pericardial  effusion is circumferential. Presence of epicardial fat layer.   Mitral Valve: The mitral valve is normal in structure. Mild to moderate  mitral valve regurgitation.   Tricuspid Valve: Multiple jets. The tricuspid valve is normal in  structure. Tricuspid valve regurgitation is mild to moderate.   Aortic Valve: The aortic valve is tricuspid. Aortic valve regurgitation is  not visualized. No aortic stenosis is present.   Pulmonic Valve: The pulmonic valve was normal in structure. Pulmonic valve  regurgitation is trivial. No evidence  of pulmonic stenosis.   Aorta: The aortic root, ascending aorta and aortic arch are all  structurally normal, with no evidence of dilitation or obstruction.   Venous: The inferior vena cava is dilated in size with less than 50%  respiratory variability, suggesting right atrial pressure of 15 mmHg.   IAS/Shunts: The atrial septum is grossly normal.    Physical Exam .   GEN: No acute distress.   Neck: No JVD Cardiac: RRR, occ premature beats no murmurs, rubs, or gallops.  Respiratory: Clear to auscultation bilaterally. GI: Soft, nontender, non-distended  MS: 2-3+ lower ext edema and of Rt hand  Assessment & Plan .     Pre-op eval in pt with known CAD with BMS to LAD in 2001 and PTCA to D1 in 2001 and 2002. Last cath 2017 patent and last nuc study 2022 no ischemia.  She has no angina but is in chair or bed  all day. Last nuc study in 2022 was no ischemia.  Her echo is similar to older one though now with right ventricular systolic pressure is 61.5 mmHg. She is class 4 risk 11% risk of major cardiac events though she is stable from cardiology perspective.   "The patient had no high risk cardiac symptoms. She does have significant risk associated with her co morbid conditions and debilitated state. However, there is no preop cardiovascular testing that will reduce that risk. She needs to be followed closely for her rhythm intra/post op. Given elevated pulmonary pressures we need to be aware that she will likely be somewhat preload dependent and avoid excessive diuresis "   PAF and SVT with CHA2DS2VASc of 6 with hx of CVA. Has been on eliquis but not on anticoagulation since 8/22 AM  on 8/22 had RVR unsure if a fib vs SVT  Brief run of atrial tach vs atrial fib. No indication for systemic anticoagulation at this time. She will need continuous tele.  her BB held for hypotension -- today 111/50 and 111/48  P 92 to 108    AKI has improved overall but beginning to climb  labs ordered but not yet  done.  Pulmonary HTN with right ventricular systolic pressure is 61.5 mmHg.   Hypotension on midodrine  BP now 111/48   CAD with hx of stent to LAD and PTCA to D1 in 2001 and 2002, last nuc study was neg for ischemia.   No angina  Hx of HFpEF and was on lasix as outpt.no on 20 po BID  Diarrhea/ PCR positive for enteropathogenic E. Coli in July and treated/sigmoid stricture and need for segmental colonic resection of the stricture per IM and Surgery.   RA/hyponatremia/anemia per IM  For questions or updates, please contact [facility] Please consult [email or web address] for contact info under     Signed, Nada Boozer, NP   History and all data above reviewed.  Patient examined.  I agree with the findings as above.  No chest pain.  No SOB.   The patient exam reveals NIO:[id number]  ,  Lungs: Clear  ,  Abd: Positive bowel sounds, no rebound no guarding, Ext Severe edema  .  All available labs, radiology testing, previous records reviewed. Agree with documented assessment and plan.   PAT:  No change in therapy.  Follow post op.    CAD:  No active ischemia.  Continue medical management.  Pulmonary HTN:  Follow BPs and peripheral edema post op.   Fayrene Fearing Camren Lipsett  10:02 AM  03/08/2023

## 2023-03-08 NOTE — Progress Notes (Signed)
CCC Pre-op Review  Pre-op checklist: asked floor RN to complete  NPO: yes  Labs: WNL, T/S needs to be drawn  Consent: completed per order  H&P: PA progress note 8/26  Vitals: WNL  O2 requirements: 97% RA  MAR/PTA review: completed  IV: 20g RAC  Floor nurse name:  Shanda Bumps RN  Additional info:

## 2023-03-09 ENCOUNTER — Encounter (HOSPITAL_COMMUNITY): Payer: Self-pay | Admitting: Surgery

## 2023-03-09 DIAGNOSIS — N179 Acute kidney failure, unspecified: Secondary | ICD-10-CM | POA: Diagnosis not present

## 2023-03-09 DIAGNOSIS — I480 Paroxysmal atrial fibrillation: Secondary | ICD-10-CM | POA: Diagnosis not present

## 2023-03-09 LAB — BASIC METABOLIC PANEL
Anion gap: 12 (ref 5–15)
BUN: 17 mg/dL (ref 8–23)
CO2: 15 mmol/L — ABNORMAL LOW (ref 22–32)
Calcium: 7.3 mg/dL — ABNORMAL LOW (ref 8.9–10.3)
Chloride: 105 mmol/L (ref 98–111)
Creatinine, Ser: 1.21 mg/dL — ABNORMAL HIGH (ref 0.44–1.00)
GFR, Estimated: 48 mL/min — ABNORMAL LOW (ref >60)
Glucose, Bld: 133 mg/dL — ABNORMAL HIGH (ref 70–99)
Potassium: 4.3 mmol/L (ref 3.5–5.1)
Sodium: 132 mmol/L — ABNORMAL LOW (ref 135–145)

## 2023-03-09 LAB — CBC
HCT: 26.4 % — ABNORMAL LOW (ref 36.0–46.0)
Hemoglobin: 8.4 g/dL — ABNORMAL LOW (ref 12.0–15.0)
MCH: 29.3 pg (ref 26.0–34.0)
MCHC: 31.8 g/dL (ref 30.0–36.0)
MCV: 92 fL (ref 80.0–100.0)
Platelets: 267 10*3/uL (ref 150–400)
RBC: 2.87 MIL/uL — ABNORMAL LOW (ref 3.87–5.11)
RDW: 18 % — ABNORMAL HIGH (ref 11.5–15.5)
WBC: 10.6 10*3/uL — ABNORMAL HIGH (ref 4.0–10.5)
nRBC: 0 % (ref 0.0–0.2)

## 2023-03-09 LAB — MAGNESIUM: Magnesium: 1.9 mg/dL (ref 1.7–2.4)

## 2023-03-09 LAB — SURGICAL PATHOLOGY

## 2023-03-09 MED ORDER — PROCHLORPERAZINE EDISYLATE 10 MG/2ML IJ SOLN
10.0000 mg | Freq: Four times a day (QID) | INTRAMUSCULAR | Status: DC | PRN
  Administered 2023-03-14 – 2023-03-19 (×2): 10 mg via INTRAVENOUS
  Filled 2023-03-09 (×2): qty 2

## 2023-03-09 MED ORDER — METOPROLOL TARTRATE 25 MG PO TABS
25.0000 mg | ORAL_TABLET | Freq: Two times a day (BID) | ORAL | Status: DC
  Administered 2023-03-09 – 2023-03-18 (×11): 25 mg via ORAL
  Filled 2023-03-09 (×17): qty 1

## 2023-03-09 MED ORDER — CHLORHEXIDINE GLUCONATE CLOTH 2 % EX PADS
6.0000 | MEDICATED_PAD | Freq: Every day | CUTANEOUS | Status: DC
  Administered 2023-03-09 – 2023-03-21 (×10): 6 via TOPICAL

## 2023-03-09 NOTE — Anesthesia Postprocedure Evaluation (Signed)
Anesthesia Post Note  Patient: Cindy Holmes  Procedure(s) Performed: EXPLORATION LAPAROTOMY (Abdomen) COLECTOMY WITH COLOSTOMY CREATION/HARTMANN PROCEDURE (Abdomen) SMALL BOWEL RESECTION (Abdomen)     Patient location during evaluation: PACU Anesthesia Type: General Level of consciousness: awake and alert Pain management: pain level controlled Vital Signs Assessment: post-procedure vital signs reviewed and stable Respiratory status: spontaneous breathing, nonlabored ventilation, respiratory function stable and patient connected to nasal cannula oxygen Cardiovascular status: blood pressure returned to baseline and stable Postop Assessment: no apparent nausea or vomiting Anesthetic complications: no   There were no known notable events for this encounter.  Last Vitals:  Vitals:   03/09/23 0906 03/09/23 1118  BP:    Pulse:    Resp: 16   Temp:    SpO2: 98% 98%    Last Pain:  Vitals:   03/09/23 1118  TempSrc:   PainSc: 1                  Kennieth Rad

## 2023-03-09 NOTE — Consult Note (Addendum)
[facility] [employer] service manager [facility]) Accountable Care Organization (ACO) [facility] Liaison Note  03/09/2023  Cindy Holmes 16-Nov-1950 [id number]  Location: [facility] RN Hospital Liaison met patient at bedside at [facility]. Covering for Charlesetta Shanks, RN as hospital liaison.  Insurance:  Medicare   Cindy Holmes is a 72 y.o. female who is a Primary Care Patient of [name], MD [facility]  The patient was screened for 30 day readmission hospitalization with noted high risk score for unplanned readmission risk with 2 IP in 6 months.  The patient was assessed for potential [facility]) Care Management service needs for post hospital transition for care coordination. Review of patient's electronic medical record reveals patient was admitted with Acute Kidney Injury. Liaison met with pt and son (Cindy Holmes) at bedside today. Liaison introduced purpose for today's visit alone with care management services. Pt receive care from her spouse who provides all of her needs and care. Pt has admantely declined any suggested SNF for ongoing rehab as son states pt non-ambulatory uses a scooter and can only stand for transfers. States bad experience with SNF in the past and pt has refused since that incident. Son states all medical conditions are managed well with her ongoing medications and at this time pt declined any care management services through [facility]. Pt wishes are to return home in the care of her spouse.   [facility] Care Management/Population Health does not replace or interfere with any arrangements made by the Inpatient Transition of Care team.   For questions contact:   Elliot Cousin, RN, [facility] Liaison    Population Health Office Hours MTWF  8:00 am-6:00 pm [phone] mobile [phone] [Office toll free line] Office Hours are M-F 8:30 - 5 pm [email or web address]

## 2023-03-09 NOTE — Progress Notes (Addendum)
Physical Therapy Re-evaluation Patient Details Name: Cindy Holmes MRN: [id number] DOB: 09-Nov-1950 Today's Date: 03/09/2023   History of Present Illness Pt is 72 yo presenting with persistent diarrhea, nausea and vomiting. Ex-lap with sigmoid colectomy and small bowel resection on 8/27. PMH: HTN, RA, CAD chronic HFpEF and hx CVA. Pt was recently discharged from hospital to home for similar issue.    PT Comments  PT performs re-evaluation after sigmoid colectomy and small bowel resection. Pt is limited by nausea and dizziness with sitting at edge of bed, returning to supine quickly after obtaining a sitting position. Pt reports she has had a history of dizziness when sitting up, PT encourages elevation of head of bed to improve tolerance for upright positioning. Pt has been requiring physical assistance from her significant other for bed mobility and transfers since last admission. Pt requires significant assistance to perform bed mobility at this time. PT encourages AROM and bed mobility as tolerated with staff assistance. PT will follow to progress to out of bed activity. Pt is below her recent baseline at this time and is at a high risk for falls. Pt has been resistant to post-acute PT services in the past. PT will attempt to maximize function during this admission in an effort to reduce caregiver burden. A hospital bed and hoyer lift would be necessary to return home at this time.   If plan is discharge home, recommend the following: Two people to help with walking and/or transfers;Two people to help with bathing/dressing/bathroom;Assistance with cooking/housework;Assistance with feeding;Assist for transportation;Help with stairs or ramp for entrance;Supervision due to cognitive status   Can travel by private vehicle        Equipment Recommendations  [employer] lift;Hospital bed    Recommendations for Other Services       Precautions / Restrictions Precautions Precautions: Fall Precaution  Comments: colostomy, PCA pump Restrictions Weight Bearing Restrictions: No     Mobility  Bed Mobility Overal bed mobility: Needs Assistance Bed Mobility: Supine to Sit, Sit to Supine     Supine to sit: Max assist, HOB elevated Sit to supine: Max assist, HOB elevated        Transfers Overall transfer level:  (deferred due to nausea)                      Ambulation/Gait                   Stairs             Wheelchair Mobility     Tilt Bed    Modified Rankin (Stroke Patients Only)       Balance Overall balance assessment: Needs assistance Sitting-balance support: Single extremity supported, Feet supported Sitting balance-Leahy Scale: Poor Sitting balance - Comments: min-modA Postural control: Left lateral lean                                  Cognition Arousal: Alert Behavior During Therapy: Flat affect Overall Cognitive Status: Impaired/Different from baseline Area of Impairment: Problem solving                             Problem Solving: Slow processing          Exercises      General Comments General comments (skin integrity, edema, etc.): HR in 70s, pt reports dizziness and nausea in sitting, unable to  assess BP in sitting due to poor tolerance. Symptoms subside in supine. Of note pt is on PCA which could be contributing to symptoms      Pertinent Vitals/Pain Pain Assessment Pain Assessment: 0-10 Pain Score: 8  Pain Location: abdomen Pain Descriptors / Indicators: Sore Pain Intervention(s): PCA encouraged    Home Living                          Prior Function            PT Goals (current goals can now be found in the care plan section) Acute Rehab PT Goals Patient Stated Goal: Go home with spouse PT Goal Formulation: With patient/family Time For Goal Achievement: 03/23/23 Potential to Achieve Goals: Poor Progress towards PT goals: Not progressing toward goals -  comment;Goals downgraded-see care plan (limited by nausea)    Frequency    Min 1X/week      PT Plan      Co-evaluation              AM-PAC PT "6 Clicks" Mobility   Outcome Measure  Help needed turning from your back to your side while in a flat bed without using bedrails?: A Lot Help needed moving from lying on your back to sitting on the side of a flat bed without using bedrails?: A Lot Help needed moving to and from a bed to a chair (including a wheelchair)?: Total Help needed standing up from a chair using your arms (e.g., wheelchair or bedside chair)?: Total Help needed to walk in hospital room?: Total Help needed climbing 3-5 steps with a railing? : Total 6 Click Score: 8    End of Session Equipment Utilized During Treatment: Oxygen Activity Tolerance: Treatment limited secondary to medical complications (Comment) (nausea) Patient left: in bed;with call bell/phone within reach;with bed alarm set Nurse Communication: Mobility status;Need for lift equipment PT Visit Diagnosis: History of falling (Z91.81);Muscle weakness (generalized) (M62.81)     Time: 4098-1191 PT Time Calculation (min) (ACUTE ONLY): 13 min  Charges: 1 Re-eval     PT General Charges $$ ACUTE PT VISIT: 1 Visit                     Arlyss Gandy, PT, DPT Acute Rehabilitation Office [phone]    Arlyss Gandy 03/09/2023, 3:53 PM

## 2023-03-09 NOTE — Progress Notes (Signed)
Patient ID: Cindy Holmes, female   DOB: 05/02/1951, 72 y.o.   MRN: [id number] [facility] Surgery Progress Note  1 Day Post-Op  Subjective: CC-  Abdomen sore but PCA helps. Has not been OOB yet. No colostomy output. Patient not sure what happened to NG tube. She has had some nausea, no emesis.  Objective: Vital signs in last 24 hours: Temp:  [97.2 F (36.2 C)-98.7 F (37.1 C)] 98.3 F (36.8 C) (08/28 0845) Pulse Rate:  [83-106] 98 (08/28 0845) Resp:  [13-24] 16 (08/28 0906) BP: (108-135)/(52-64) 135/62 (08/28 0845) SpO2:  [91 %-100 %] 98 % (08/28 0906) FiO2 (%):  [92 %-99 %] 99 % (08/28 0439) Weight:  [99.8 kg] 99.8 kg (08/27 1018) Last BM Date : 03/06/23  Intake/Output from previous day: 08/27 0701 - 08/28 0700 In: 1424.6 [I.V.:1324.6; IV Piggyback:100] Out: 590 [Urine:570; Blood:20] Intake/Output this shift: No intake/output data recorded.  PE: Gen:  Alert, NAD Abd: soft, some distension, appropriately tender, hypoactive bowel sounds, ostomy dark but viable with no output, midline wound with honeycomb dressing present and small amount of bloody drainage  Lab Results:  Recent Labs    03/09/23 0524  WBC 10.6*  HGB 8.4*  HCT 26.4*  PLT 267   BMET Recent Labs    03/07/23 0459 03/08/23 1849  NA 131* 134*  K 3.5 3.8  CL 103 104  CO2 19* 23  GLUCOSE 93 137*  BUN 21 17  CREATININE 1.46* 1.33*  CALCIUM 7.7* 7.6*   PT/INR No results for input(s): "LABPROT", "INR" in the last 72 hours. CMP     Component Value Date/Time   NA 134 (L) 03/08/2023 1849   NA 141 08/13/2022 1304   K 3.8 03/08/2023 1849   CL 104 03/08/2023 1849   CO2 23 03/08/2023 1849   GLUCOSE 137 (H) 03/08/2023 1849   BUN 17 03/08/2023 1849   BUN 19 08/13/2022 1304   CREATININE 1.33 (H) 03/08/2023 1849   CREATININE 0.68 11/13/2015 1047   CALCIUM 7.6 (L) 03/08/2023 1849   PROT 4.9 (L) 03/06/2023 0624   PROT 6.3 08/13/2022 1304   ALBUMIN 2.2 (L) 03/06/2023 0624   ALBUMIN 4.4  08/13/2022 1304   AST 12 (L) 03/06/2023 0624   ALT 9 03/06/2023 0624   ALKPHOS 52 03/06/2023 0624   BILITOT 0.5 03/06/2023 0624   BILITOT 0.5 08/13/2022 1304   GFRNONAA 43 (L) 03/08/2023 1849   GFRAA >60 12/11/2018 1222   Lipase     Component Value Date/Time   LIPASE 50 03/02/2023 1119       Studies/Results: PERIPHERAL VASCULAR CATHETERIZATION  Result Date: 03/08/2023 See surgical note for result.   Anti-infectives: Anti-infectives (From admission, onward)    Start     Dose/Rate Route Frequency Ordered Stop   03/08/23 1204  sodium chloride 0.9 % with cefoTEtan (CEFOTAN) ADS Med       Note to Pharmacy: Susy [place] L: cabinet override      03/08/23 1204 03/08/23 1247   03/07/23 0600  cefoTEtan (CEFOTAN) 2 g in sodium chloride 0.9 % 100 mL IVPB  Status:  Discontinued        2 g 200 mL/hr over 30 Minutes Intravenous On call to O.R. 03/06/23 1037 03/08/23 0559   03/06/23 1400  neomycin (MYCIFRADIN) tablet 1,000 mg       Placed in "And" Linked Group   1,000 mg Oral 3 times per day 03/06/23 1037 03/06/23 2243   03/06/23 1400  metroNIDAZOLE (FLAGYL) tablet 1,000 mg  Placed in "And" Linked Group   1,000 mg Oral 3 times per day 03/06/23 1037 03/07/23 1359   03/02/23 2000  azithromycin (ZITHROMAX) 500 mg in sodium chloride 0.9 % 250 mL IVPB  Status:  Discontinued        500 mg 250 mL/hr over 60 Minutes Intravenous Every 24 hours 03/02/23 1959 03/04/23 1745        Assessment/Plan Sigmoid colon stricture -POD#1 s/p Sigmoid colectomy with Hartman's procedure and end colostomy, small bowel resection  8/27 Dr. Luisa Hart - surgical path pending - Ok to leave NG out, but if patient develops worsening nausea or any emesis will need to be replaced. Keep NPO and await return in bowel function - mobilize, PT  - WOC following for new ostomy. Will need ostomy clinic referral at discharge - continue PCA today, and IV tylenol  ID - cefotetan periop FEN - IVF, NPO VTE - SCDs,  lovenox Foley - continue for now for strict I&O, BMP pending and low UOP over night  CAD - eliquis on Hold CHF HTN HLD RA on Plaquenil and methotrexate.  Currently on hold Morbid obesity Anemia    LOS: 7 days    Franne Forts, [facility] Surgery 03/09/2023, 9:35 AM Please see Amion for pager number during day hours 7:00am-4:30pm

## 2023-03-09 NOTE — Plan of Care (Signed)
  Problem: Education: Goal: Knowledge of General Education information will improve Description: Including pain rating scale, medication(s)/side effects and non-pharmacologic comfort measures Outcome: Progressing   Problem: Health Behavior/Discharge Planning: Goal: Ability to manage health-related needs will improve Outcome: Progressing   Problem: Clinical Measurements: Goal: Ability to maintain clinical measurements within normal limits will improve Outcome: Progressing Goal: Will remain free from infection Outcome: Progressing Goal: Diagnostic test results will improve Outcome: Progressing Goal: Respiratory complications will improve Outcome: Progressing Goal: Cardiovascular complication will be avoided Outcome: Progressing   Problem: Coping: Goal: Level of anxiety will decrease Outcome: Progressing   Problem: Elimination: Goal: Will not experience complications related to bowel motility Outcome: Progressing Goal: Will not experience complications related to urinary retention Outcome: Progressing   Problem: Pain Managment: Goal: General experience of comfort will improve Outcome: Progressing   Problem: Safety: Goal: Ability to remain free from injury will improve Outcome: Progressing   Problem: Skin Integrity: Goal: Risk for impaired skin integrity will decrease Outcome: Progressing   Problem: Education: Goal: Understanding of discharge needs will improve Outcome: Progressing Goal: Verbalization of understanding of the causes of altered bowel function will improve Outcome: Progressing   Problem: Bowel/Gastric: Goal: Gastrointestinal status for postoperative course will improve Outcome: Progressing   Problem: Health Behavior/Discharge Planning: Goal: Identification of community resources to assist with postoperative recovery needs will improve Outcome: Progressing   Problem: Nutritional: Goal: Will attain and maintain optimal nutritional status will  improve Outcome: Progressing   Problem: Clinical Measurements: Goal: Postoperative complications will be avoided or minimized Outcome: Progressing   Problem: Respiratory: Goal: Respiratory status will improve Outcome: Progressing   Problem: Skin Integrity: Goal: Will show signs of wound healing Outcome: Progressing  Less n/v Problem: Activity: Goal: Risk for activity intolerance will decrease Outcome: Not Progressing Note: Encouraged turning, mobility progression. Limited by chronic knee issues   Problem: Nutrition: Goal: Adequate nutrition will be maintained Outcome: Not Progressing   Problem: Activity: Goal: Ability to tolerate increased activity will improve Outcome: Not Progressing

## 2023-03-09 NOTE — Consult Note (Addendum)
WOC team received consult for new ostomy.  WOC marked this patient preop and will be following for new ostomy education and support starting 03/09/2023.   ADDENDUM:  9:20 a.m. went to patients room for possible postop teaching. Patient lying in bed, says husband just left and she would like for him to be present for any education.  Set up time for 11:30 Thursday 03/10/2023 for first postop session with patient and husband.  Educational booklet left in room.  Patient noted to have colostomy LLQ, wearing flat 2 3/4" at this time with bloody effluent only in pouch.  Will plan for teaching session as above    Thank you,    Priscella Mann MSN, RN-BC, [employer] [phone]

## 2023-03-09 NOTE — Progress Notes (Signed)
Initial Nutrition Assessment  DOCUMENTATION CODES:   Obesity unspecified  INTERVENTION:  RD recommends TPN to start in the next 24hrs due to NPO/Clear liquid diet status x 5+days  -patient in critical time frame for need of nutrition   Education regarding adequate nutrition and potentially starting TPN  Communicate with surgery   NUTRITION DIAGNOSIS:   Inadequate oral intake related to altered GI function, inability to eat as evidenced by NPO status.  GOAL:   Patient will meet greater than or equal to 90% of their needs   MONITOR:   Diet advancement, Labs, Weight trends, Skin, I & O's  REASON FOR ASSESSMENT:   NPO/Clear Liquid Diet    ASSESSMENT:    72 yr old female with PMHx including CAD HFpEF, RA, lupus, chronic immunosuppression, HTN, prior CVA, paroxysmal A fib, HLD, pulmonary hypertension, chronic diarrhea, who is admitted for AKI and acute on chronic diarrhea. Colonoscopy 8/23 showed sigmoid stricture. She is POD #1 Sigmoid colectomy with Hartman's procedure and end colostomy, small bowel resection.  Visited patient at bedside whose family was at bedside. Her son reports that she has only been drinking chicken broth for the past month due to altered GI function.  Patient does not want to start TPN yet, but family wants her to start immediately.   Nausea no emesis. NG tube dc'd no plans to replace if she does not vomit.  Labs: Na 132, Glu 133, Cr 1.21 Meds: reviewed  Wt:  03/08/23 99.8 kg  02/02/23 104.3 kg  08/25/22 102.1 kg  05/12/22 102.1 kg  03/15/22 102.1 kg  01/18/22 104.9 kg   I/O's: +6.4 L (net cumulative)   NUTRITION - FOCUSED PHYSICAL EXAM:  Deferred due to edema   Diet Order:   Diet Order             Diet NPO time specified Except for: Sips with Meds, Ice Chips  Diet effective midnight                   EDUCATION NEEDS:   Education needs have been addressed  Skin:  Skin Assessment: Reviewed RN Assessment  Last BM:  no ostomy  output yet  Height:   Ht Readings from Last 1 Encounters:  03/08/23 5\' 3"  (1.6 m)    Weight:   Wt Readings from Last 1 Encounters:  03/08/23 99.8 kg    Ideal Body Weight:     BMI:  Body mass index is 38.97 kg/m.  Estimated Nutritional Needs:   Kcal:  1600-1800  Protein:  120-150 g  Fluid:  UOP + 1000 ml    Leodis Rains, RDN, LDN  Clinical Nutrition

## 2023-03-09 NOTE — Progress Notes (Addendum)
Pt. Refused SCD regardless of explanation given.

## 2023-03-09 NOTE — Progress Notes (Signed)
PROGRESS NOTE    Cindy Holmes  YQI:[id number] DOB: 1951-03-17 DOA: 03/02/2023 PCP: [name], MD    Brief Narrative:  72 year old with hypertension, rheumatoid arthritis on immunosuppressive's, coronary artery disease, chronic diastolic heart failure, history of stroke presented to the ER with persistent diarrhea, nausea and vomiting.  Underwent colonoscopy and found to have sigmoid colon stenosis.  Surgery consulted.  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   Assessment & Plan:   Sigmoid stricture, overflow diarrhea with recent history of enteropathogenic E. coli: C. difficile negative.  Repeat GI panel negative.  Colonoscopy consistent with sigmoid colon narrowing.  Due to bowel obstruction, seen by surgery.  Underwent exploratory laparotomy and resection with colostomy.  Postsurgical management as per surgery.  Acute kidney injury: multifactorial. On maintenance fluid, normalizing now.   Paroxysmal A-fib: Rate controlled.  On metoprolol.  Eliquis on hold. Rheumatoid arthritis: On methotrexate and Plaquenil on hold for procedure and wound healing. Hyperlipidemia: On statin. Acute on chronic diastolic heart failure with pulmonary hypertension, bilateral leg edema, vascular congestion: Echocardiogram with ejection fraction of 70%.  Severely elevated pulmonary artery systolic pressures.  Cardiology has seen patient preop.  Anemia of chronic disease: Hemoglobin stable at around 8.  Will continue to monitor.   DVT prophylaxis: SCD.   Code Status: Full code Family Communication: Son at the bedside Disposition Plan: Status is: Inpatient Remains inpatient appropriate because: Immediate postoperative     Consultants:  General surgery Cardiology GI  Procedures:  Colonoscopy 8/23  end colostomy 8/27  Antimicrobials:  Completed   Subjective: Patient seen and examined in the morning rounds.  Hard of hearing.  Mild discomfort in the abdomen.  Son at the bedside.   Denies any nausea vomiting.  Colostomy without any stool.  Objective: Vitals:   03/09/23 0510 03/09/23 0845 03/09/23 0906 03/09/23 1118  BP: 127/60 135/62    Pulse: 83 98    Resp: 16 19 16    Temp: 98.4 F (36.9 C) 98.3 F (36.8 C)    TempSrc:      SpO2: 100% 99% 98% 98%  Weight:      Height:        Intake/Output Summary (Last 24 hours) at 03/09/2023 1410 Last data filed at 03/09/2023 1359 Gross per 24 hour  Intake 1324.63 ml  Output 495 ml  Net 829.63 ml   Filed Weights   03/02/23 1115 03/08/23 1018  Weight: 104.3 kg 99.8 kg    Examination:  General: Chronically sick looking.  Frail and debilitated.  Not in any distress. Cardiovascular: S1-S2 normal.  Irregularly irregular. Respiratory: No added sounds. Gastrointestinal: Soft.  Pendulous.  Obese.  Midline incision clean and dry.  Left lower quadrant stoma with pink mucous membranes, minimal secretions.  No poor. Ext: 1+ edema both legs.     Data Reviewed: I have personally reviewed following labs and imaging studies  CBC: Recent Labs  Lab 03/03/23 0814 03/04/23 0449 03/05/23 0459 03/06/23 0624 03/09/23 0524  WBC 7.5 7.5 6.3 4.4 10.6*  NEUTROABS  --  4.4 3.3 3.3  --   HGB 9.2* 8.9* 8.4* 8.9* 8.4*  HCT 29.1* 27.6* 26.4* 27.6* 26.4*  MCV 93.0 90.8 91.3 93.9 92.0  PLT 260 270 220 241 267   Basic Metabolic Panel: Recent Labs  Lab 03/03/23 0814 03/04/23 0449 03/05/23 0459 03/05/23 1925 03/06/23 0624 03/07/23 0459 03/08/23 1849 03/09/23 0513 03/09/23 0524  NA 132*   < > 131*  --  131* 131* 134* 132*  --   K  3.2*   < > 2.7* 3.2* 4.0 3.5 3.8 4.3  --   CL 105   < > 102  --  103 103 104 105  --   CO2 16*   < > 19*  --  21* 19* 23 15*  --   GLUCOSE 111*   < > 89  --  131* 93 137* 133*  --   BUN 30*   < > 21  --  22 21 17 17   --   CREATININE 1.24*   < > 1.21*  --  1.41* 1.46* 1.33* 1.21*  --   CALCIUM 7.7*   < > 7.6*  --  7.6* 7.7* 7.6* 7.3*  --   MG 1.5*  --  1.9  --  2.0  --   --   --  1.9   < > =  values in this interval not displayed.   GFR: Estimated Creatinine Clearance: 48.1 mL/min (A) (by C-G formula based on SCr of 1.21 mg/dL (H)). Liver Function Tests: Recent Labs  Lab 03/06/23 0624  AST 12*  ALT 9  ALKPHOS 52  BILITOT 0.5  PROT 4.9*  ALBUMIN 2.2*   No results for input(s): "LIPASE", "AMYLASE" in the last 168 hours. No results for input(s): "AMMONIA" in the last 168 hours. Coagulation Profile: No results for input(s): "INR", "PROTIME" in the last 168 hours. Cardiac Enzymes: No results for input(s): "CKTOTAL", "CKMB", "CKMBINDEX", "TROPONINI" in the last 168 hours. BNP (last 3 results) Recent Labs    05/19/22 1204  PROBNP 380*   HbA1C: Recent Labs    03/07/23 0459  HGBA1C 5.3   CBG: No results for input(s): "GLUCAP" in the last 168 hours. Lipid Profile: No results for input(s): "CHOL", "HDL", "LDLCALC", "TRIG", "CHOLHDL", "LDLDIRECT" in the last 72 hours. Thyroid Function Tests: No results for input(s): "TSH", "T4TOTAL", "FREET4", "T3FREE", "THYROIDAB" in the last 72 hours. Anemia Panel: No results for input(s): "VITAMINB12", "FOLATE", "FERRITIN", "TIBC", "IRON", "RETICCTPCT" in the last 72 hours. Sepsis Labs: Recent Labs  Lab 03/05/23 1610  LATICACIDVEN 1.9    Recent Results (from the past 240 hour(s))  Gastrointestinal Panel by PCR , Stool     Status: None   Collection Time: 03/02/23  4:10 PM   Specimen: Stool  Result Value Ref Range Status   Campylobacter species NOT DETECTED NOT DETECTED Final   Plesimonas shigelloides NOT DETECTED NOT DETECTED Final   Salmonella species NOT DETECTED NOT DETECTED Final   Yersinia enterocolitica NOT DETECTED NOT DETECTED Final   Vibrio species NOT DETECTED NOT DETECTED Final   Vibrio cholerae NOT DETECTED NOT DETECTED Final   Enteroaggregative E coli (EAEC) NOT DETECTED NOT DETECTED Final   Enteropathogenic E coli (EPEC) NOT DETECTED NOT DETECTED Final   Enterotoxigenic E coli (ETEC) NOT DETECTED NOT  DETECTED Final   Shiga like toxin producing E coli (STEC) NOT DETECTED NOT DETECTED Final   Shigella/Enteroinvasive E coli (EIEC) NOT DETECTED NOT DETECTED Final   Cryptosporidium NOT DETECTED NOT DETECTED Final   Cyclospora cayetanensis NOT DETECTED NOT DETECTED Final   Entamoeba histolytica NOT DETECTED NOT DETECTED Final   Giardia lamblia NOT DETECTED NOT DETECTED Final   Adenovirus F40/41 NOT DETECTED NOT DETECTED Final   Astrovirus NOT DETECTED NOT DETECTED Final   Norovirus GI/GII NOT DETECTED NOT DETECTED Final   Rotavirus A NOT DETECTED NOT DETECTED Final   Sapovirus (I, II, IV, and V) NOT DETECTED NOT DETECTED Final    Comment: Performed at [facility],  [address] [place], [place] [address]  C Difficile Quick Screen w PCR reflex     Status: None   Collection Time: 03/02/23  4:10 PM   Specimen: Stool  Result Value Ref Range Status   C Diff antigen NEGATIVE NEGATIVE Final   C Diff toxin NEGATIVE NEGATIVE Final   C Diff interpretation No C. difficile detected.  Final    Comment: Performed at [facility], [address], [place], [place] [address]  Surgical PCR screen     Status: None   Collection Time: 03/06/23 11:44 PM   Specimen: Nasal Mucosa; Nasal Swab  Result Value Ref Range Status   MRSA, PCR NEGATIVE NEGATIVE Final   Staphylococcus aureus NEGATIVE NEGATIVE Final    Comment: (NOTE) The Xpert SA Assay (FDA approved for NASAL specimens in patients 2 years of age and older), is one component of a comprehensive surveillance program. It is not intended to diagnose infection nor to guide or monitor treatment. Performed at [facility], [address], [place], [place] [address]          Radiology Studies: PERIPHERAL VASCULAR CATHETERIZATION  Result Date: 03/08/2023 See surgical note for result.       Scheduled Meds:  Chlorhexidine Gluconate Cloth  6 each Topical Daily   enoxaparin (LOVENOX) injection  50 mg Subcutaneous Q24H    HYDROmorphone   Intravenous Q4H   metoprolol tartrate  25 mg Oral BID   mupirocin ointment  1 Application Nasal BID   Continuous Infusions:  sodium chloride     acetaminophen 1,000 mg (03/09/23 0514)     LOS: 7 days    Time spent: 35 minutes     Dorcas Carrow, MD [facility]

## 2023-03-09 NOTE — Progress Notes (Addendum)
Patient Name: Cindy Holmes Date of Encounter: 03/09/2023 [facility] Cardiologist: Marjo Bicker, MD   Interval Summary  .    72 yr old female with PMH of CAD with hx of BMS to LAD and PTCA to D1 in 2001, repeat PTCA to D1 in 2002, HFpEF, RA, lupus, chronic immunosuppression, HTN, prior CVA, paroxysmal A fib, HLD, pulmonary hypertension, chronic diarrhea, who is admitted for AKI and acute on chronic diarrhea. Colonoscope 8/23 showed sigmoid stricture. She is POD #1 Sigmoid colectomy with Hartman's procedure and end colostomy, small bowel resection.   Cardiology is following for post op monitor given significant cardiac history.   Patient states she is sore at her surgical site, denied any chest pain, resting SOB. Her legs are more swollen than usual.   Vital Signs .    Vitals:   03/09/23 0439 03/09/23 0510 03/09/23 0845 03/09/23 0906  BP:  127/60 135/62   Pulse:  83 98   Resp: 19 16 19 16   Temp:  98.4 F (36.9 C) 98.3 F (36.8 C)   TempSrc:      SpO2: 99% 100% 99% 98%  Weight:      Height:        Intake/Output Summary (Last 24 hours) at 03/09/2023 0924 Last data filed at 03/09/2023 2956 Gross per 24 hour  Intake 1424.63 ml  Output 590 ml  Net 834.63 ml      03/08/2023   10:18 AM 03/02/2023   11:15 AM 02/02/2023    1:56 PM  Last 3 Weights  Weight (lbs) 220 lb 229 lb 15 oz 230 lb  Weight (kg) 99.791 kg 104.3 kg 104.327 kg      Telemetry/ECG    Sinus rhythm, no A fib or AT in the past 24 hours  - Personally Reviewed  Physical Exam .   GEN: No acute distress.  Obese  Neck: No JVD Cardiac: RRR, no murmurs, rubs, or gallops.  Respiratory: Clear to auscultation bilaterally. On [place] oxygen.  GI: Soft, nontender, non-distended  MS: 1+ BLE edema  Assessment & Plan .     Paroxysmal A fib - telemetry showed no recurrence of A fib or SVT or AT in the past 24 hours, continue monitor  - BP stable not on midodrine anymore, would resume PTA metoprolol (on  75mg  BID at home) at 25mg  BID today - PTA Eliquis held since 03/03/23 for surgery, resume upon clearance from surgery team, no bridge needed - please keep Mag >2 and K >4   Chronic diastolic heart failure  Pulmonary HTN  - Echo with LVEF 65-70% on 03/05/23 , grade II DD, normal RV, severely elevated PSAP 61.5 mmHg  CAD, mild to mod TR - respiratory exam stable, BLE 1+ edema noted today maybe dependent,  if CHF symptoms progress and resume PO intake, would add IV Lasix for diuresis (on PTA lasix 60mg  AM and 40mg  PM) - GDMT: resume metoprolol today, spironolactone held, not candidate for SGLT2i due to hx of UTI - RHC can be considered outpatient  CAD s/p PCI to LAD with BMS and POBA to D1 x2 - Last myoview 03/2021 low risk with no ischemia or infarction. - she has no chest pain - ASA 81mg  held, resume upon clerance by surgery; resume metoprolol; may resume PTA ramipril if AKI resolves and BP allows; may resume PTA lipitor and zetia when PO intake is regular  - transfuse to keep Hgb >8   Sigmoid stricture  Acute on chronic diarrhea  AKI  Obesity  RA Anemia  - per primary team    For questions or updates, please contact [facility] Please consult [email or web address] for contact info under        Signed, Cyndi Bender, NP    History and all data above reviewed.  Patient examined.  I agree with the findings as above.  No symptoms of SOB.  No chest pain.  The patient exam reveals COR:RRR  ,  Lungs: Decreased breath sounds.  No crackles.   ,  Abd:   Decreased bowel sounds and tender to touch. , Ext Moderate leg edema  .  All available labs, radiology testing, previous records reviewed. Agree with documented assessment and plan.   Elevated pulmonary pressures:  No change in therapy.  We will follow with out patient echo in the months to come and consider right heart cath as indicated.    CAD:  No acute ischemic events post op.  Titrate beta blocker back up as BP allows.  Resume ASA when OK  with surgery.    Rollene Rotunda  9:34 AM  03/09/2023

## 2023-03-10 DIAGNOSIS — I480 Paroxysmal atrial fibrillation: Secondary | ICD-10-CM | POA: Diagnosis not present

## 2023-03-10 DIAGNOSIS — I5032 Chronic diastolic (congestive) heart failure: Secondary | ICD-10-CM

## 2023-03-10 DIAGNOSIS — N179 Acute kidney failure, unspecified: Secondary | ICD-10-CM | POA: Diagnosis not present

## 2023-03-10 LAB — BASIC METABOLIC PANEL
Anion gap: 11 (ref 5–15)
BUN: 18 mg/dL (ref 8–23)
CO2: 19 mmol/L — ABNORMAL LOW (ref 22–32)
Calcium: 7.6 mg/dL — ABNORMAL LOW (ref 8.9–10.3)
Chloride: 104 mmol/L (ref 98–111)
Creatinine, Ser: 1.13 mg/dL — ABNORMAL HIGH (ref 0.44–1.00)
GFR, Estimated: 52 mL/min — ABNORMAL LOW (ref >60)
Glucose, Bld: 123 mg/dL — ABNORMAL HIGH (ref 70–99)
Potassium: 4.3 mmol/L (ref 3.5–5.1)
Sodium: 134 mmol/L — ABNORMAL LOW (ref 135–145)

## 2023-03-10 LAB — CBC
HCT: 30 % — ABNORMAL LOW (ref 36.0–46.0)
Hemoglobin: 9.2 g/dL — ABNORMAL LOW (ref 12.0–15.0)
MCH: 29.5 pg (ref 26.0–34.0)
MCHC: 30.7 g/dL (ref 30.0–36.0)
MCV: 96.2 fL (ref 80.0–100.0)
Platelets: 262 10*3/uL (ref 150–400)
RBC: 3.12 MIL/uL — ABNORMAL LOW (ref 3.87–5.11)
RDW: 17.9 % — ABNORMAL HIGH (ref 11.5–15.5)
WBC: 10.4 10*3/uL (ref 4.0–10.5)
nRBC: 0.2 % (ref 0.0–0.2)

## 2023-03-10 LAB — MAGNESIUM: Magnesium: 2.1 mg/dL (ref 1.7–2.4)

## 2023-03-10 MED ORDER — ACETAMINOPHEN 500 MG PO TABS
1000.0000 mg | ORAL_TABLET | Freq: Four times a day (QID) | ORAL | Status: DC
  Administered 2023-03-10 – 2023-03-21 (×24): 1000 mg via ORAL
  Filled 2023-03-10 (×33): qty 2

## 2023-03-10 MED ORDER — TORSEMIDE 20 MG PO TABS
20.0000 mg | ORAL_TABLET | Freq: Two times a day (BID) | ORAL | Status: DC
  Administered 2023-03-10 – 2023-03-18 (×13): 20 mg via ORAL
  Filled 2023-03-10 (×16): qty 1

## 2023-03-10 MED ORDER — METHOCARBAMOL 500 MG PO TABS
500.0000 mg | ORAL_TABLET | Freq: Three times a day (TID) | ORAL | Status: DC | PRN
  Administered 2023-03-15: 500 mg via ORAL
  Filled 2023-03-10 (×2): qty 1

## 2023-03-10 NOTE — Progress Notes (Addendum)
Patient ID: Cindy Holmes, female   DOB: June 14, 1951, 72 y.o.   MRN: [id number] [facility] Surgery Progress Note  2 Days Post-Op  Subjective: Some gas rumbling.  No nausea with NGT out.  Requires significant assistance to mobilize with therapies.    Objective: Vital signs in last 24 hours: Temp:  [97.6 F (36.4 C)-97.9 F (36.6 C)] 97.6 F (36.4 C) (08/29 0847) Pulse Rate:  [66-86] 86 (08/29 0847) Resp:  [15-18] 16 (08/29 0847) BP: (122-126)/(51-66) 122/51 (08/29 0847) SpO2:  [97 %-100 %] 100 % (08/29 0847) FiO2 (%):  [95 %-98 %] 95 % (08/29 0418) Last BM Date : 03/09/23  Intake/Output from previous day: 08/28 0701 - 08/29 0700 In: 1459.5 [I.V.:1059.5; IV Piggyback:400] Out: 650 [Urine:650] Intake/Output this shift: Total I/O In: -  Out: 250 [Urine:250]  PE: Gen:  Alert, NAD Abd: soft, ND, appropriately tender, + bowel sounds, ostomy viable with no output, midline wound with honeycomb dressing present and small amount of bloody drainage  Lab Results:  Recent Labs    03/09/23 0524 03/10/23 0458  WBC 10.6* 10.4  HGB 8.4* 9.2*  HCT 26.4* 30.0*  PLT 267 262   BMET Recent Labs    03/09/23 0513 03/10/23 0458  NA 132* 134*  K 4.3 4.3  CL 105 104  CO2 15* 19*  GLUCOSE 133* 123*  BUN 17 18  CREATININE 1.21* 1.13*  CALCIUM 7.3* 7.6*   PT/INR No results for input(s): "LABPROT", "INR" in the last 72 hours. CMP     Component Value Date/Time   NA 134 (L) 03/10/2023 0458   NA 141 08/13/2022 1304   K 4.3 03/10/2023 0458   CL 104 03/10/2023 0458   CO2 19 (L) 03/10/2023 0458   GLUCOSE 123 (H) 03/10/2023 0458   BUN 18 03/10/2023 0458   BUN 19 08/13/2022 1304   CREATININE 1.13 (H) 03/10/2023 0458   CREATININE 0.68 11/13/2015 1047   CALCIUM 7.6 (L) 03/10/2023 0458   PROT 4.9 (L) 03/06/2023 0624   PROT 6.3 08/13/2022 1304   ALBUMIN 2.2 (L) 03/06/2023 0624   ALBUMIN 4.4 08/13/2022 1304   AST 12 (L) 03/06/2023 0624   ALT 9 03/06/2023 0624   ALKPHOS 52  03/06/2023 0624   BILITOT 0.5 03/06/2023 0624   BILITOT 0.5 08/13/2022 1304   GFRNONAA 52 (L) 03/10/2023 0458   GFRAA >60 12/11/2018 1222   Lipase     Component Value Date/Time   LIPASE 50 03/02/2023 1119       Studies/Results: PERIPHERAL VASCULAR CATHETERIZATION  Result Date: 03/08/2023 See surgical note for result.   Anti-infectives: Anti-infectives (From admission, onward)    Start     Dose/Rate Route Frequency Ordered Stop   03/08/23 1204  sodium chloride 0.9 % with cefoTEtan (CEFOTAN) ADS Med       Note to Pharmacy: [facility] [place] L: cabinet override      03/08/23 1204 03/08/23 1247   03/07/23 0600  cefoTEtan (CEFOTAN) 2 g in sodium chloride 0.9 % 100 mL IVPB  Status:  Discontinued        2 g 200 mL/hr over 30 Minutes Intravenous On call to O.R. 03/06/23 1037 03/08/23 0559   03/06/23 1400  neomycin (MYCIFRADIN) tablet 1,000 mg       Placed in "And" Linked Group   1,000 mg Oral 3 times per day 03/06/23 1037 03/06/23 2243   03/06/23 1400  metroNIDAZOLE (FLAGYL) tablet 1,000 mg       Placed in "And" Linked Group  1,000 mg Oral 3 times per day 03/06/23 1037 03/07/23 1359   03/02/23 2000  azithromycin (ZITHROMAX) 500 mg in sodium chloride 0.9 % 250 mL IVPB  Status:  Discontinued        500 mg 250 mL/hr over 60 Minutes Intravenous Every 24 hours 03/02/23 1959 03/04/23 1745        Assessment/Plan POD#2 s/p Sigmoid colectomy with Hartman's procedure and end colostomy, small bowel resection  8/27 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 - surgical path pending - will allow for sips of clears from the floor today and see how she does. - mobilize, PT, IS - WOC following for new ostomy. Will need ostomy clinic referral at discharge, order has been placed - dilaudid pushes, robaxin prn, tylenol scheduled  ID - cefotetan periop FEN - IVF, NPO x sips of clears from the floor VTE - SCDs, lovenox Foley - continue for now for strict I&O, diuresis today.  Can remove from  surgical standpoint when appropriate by cards and medicine  CAD - eliquis on Hold CHF HTN HLD RA on Plaquenil and methotrexate.  Currently on hold Morbid obesity Anemia    LOS: 8 days    Letha Cape, [facility] Surgery 03/10/2023, 12:28 PM Please see Amion for pager number during day hours 7:00am-4:30pm

## 2023-03-10 NOTE — Progress Notes (Signed)
PROGRESS NOTE    Cindy Holmes  FAO:[id number] DOB: [birth year] DOA: 03/02/2023 PCP: [name], MD    Brief Narrative:  72 year old with hypertension, rheumatoid arthritis on immunosuppressive's, coronary artery disease, chronic diastolic heart failure, history of stroke presented to the ER with persistent diarrhea, nausea and vomiting.  Underwent colonoscopy and found to have sigmoid colon stenosis.  Surgery consulted.  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   Assessment & Plan:   Sigmoid stricture, overflow diarrhea with recent history of enteropathogenic E. coli: C. difficile negative.  Repeat GI panel negative.  Colonoscopy consistent with sigmoid colon narrowing.  Due to bowel obstruction, seen by surgery.  Underwent exploratory laparotomy and resection with colostomy.  Postsurgical management as per surgery. Patient was NPO.  Currently started on sips and chips. No need for PCA pump, she has been using very sparingly.  Use injectable Dilaudid as needed.  Acute kidney injury: multifactorial. On maintenance fluid, normalizing now.  See below.  Torsemide today.  Paroxysmal A-fib: Rate controlled.  On metoprolol.  Eliquis on hold. Rheumatoid arthritis: On methotrexate and Plaquenil on hold for procedure and wound healing. Hyperlipidemia: On statin. Acute on chronic diastolic heart failure with pulmonary hypertension, bilateral leg edema, vascular congestion: Echocardiogram with ejection fraction of 70%.  Severely elevated pulmonary artery systolic pressures.  Cardiology has seen patient preop. Will discontinue further fluids.  Start torsemide 20 mg twice daily.  Anemia of chronic disease: Hemoglobin stable at around 8.  Will continue to monitor.   DVT prophylaxis: SCD.   Code Status: Full code Family Communication: Husband at the bedside Disposition Plan: Status is: Inpatient Remains inpatient appropriate because: Immediate postoperative     Consultants:   General surgery Cardiology GI  Procedures:  Colonoscopy 8/23  end colostomy 8/27  Antimicrobials:  Completed   Subjective:  Patient seen and examined.  Husband at the bedside.  Patient was tired of alarms.  She only pushed few times overnight for the Dilaudid PCA pump.  Wants to get rid of wires and pipes.  Objective: Vitals:   03/10/23 0023 03/10/23 0418 03/10/23 0517 03/10/23 0847  BP:   126/66 (!) 122/51  Pulse:   83 86  Resp: 16 18 15 16   Temp:   97.7 F (36.5 C) 97.6 F (36.4 C)  TempSrc:   Oral Oral  SpO2: 97% 100% 100% 100%  Weight:      Height: 5\' 3"  (1.6 m)       Intake/Output Summary (Last 24 hours) at 03/10/2023 1317 Last data filed at 03/10/2023 1232 Gross per 24 hour  Intake 1934.15 ml  Output 900 ml  Net 1034.15 ml   Filed Weights   03/02/23 1115 03/08/23 1018  Weight: 104.3 kg 99.8 kg    Examination:  General: Chronically sick looking.  Frail and debilitated.  Not in any distress. On minimal oxygen.  Looks comfortable. Cardiovascular: S1-S2 normal.  Irregularly irregular. Respiratory: No added sounds. Gastrointestinal: Soft.  Pendulous.  Obese.  Midline incision clean and dry.  Left lower quadrant stoma with pink mucous membranes, minimal secretions.  No poor. Ext: 1+ edema both legs.     Data Reviewed: I have personally reviewed following labs and imaging studies  CBC: Recent Labs  Lab 03/04/23 0449 03/05/23 0459 03/06/23 0624 03/09/23 0524 03/10/23 0458  WBC 7.5 6.3 4.4 10.6* 10.4  NEUTROABS 4.4 3.3 3.3  --   --   HGB 8.9* 8.4* 8.9* 8.4* 9.2*  HCT 27.6* 26.4* 27.6* 26.4* 30.0*  MCV 90.8 91.3  93.9 92.0 96.2  PLT 270 220 241 267 262   Basic Metabolic Panel: Recent Labs  Lab 03/05/23 0459 03/05/23 1925 03/06/23 0624 03/07/23 0459 03/08/23 1849 03/09/23 0513 03/09/23 0524 03/10/23 0458  NA 131*  --  131* 131* 134* 132*  --  134*  K 2.7*   < > 4.0 3.5 3.8 4.3  --  4.3  CL 102  --  103 103 104 105  --  104  CO2 19*  --   21* 19* 23 15*  --  19*  GLUCOSE 89  --  131* 93 137* 133*  --  123*  BUN 21  --  22 21 17 17   --  18  CREATININE 1.21*  --  1.41* 1.46* 1.33* 1.21*  --  1.13*  CALCIUM 7.6*  --  7.6* 7.7* 7.6* 7.3*  --  7.6*  MG 1.9  --  2.0  --   --   --  1.9 2.1   < > = values in this interval not displayed.   GFR: Estimated Creatinine Clearance: 51.5 mL/min (A) (by C-G formula based on SCr of 1.13 mg/dL (H)). Liver Function Tests: Recent Labs  Lab 03/06/23 0624  AST 12*  ALT 9  ALKPHOS 52  BILITOT 0.5  PROT 4.9*  ALBUMIN 2.2*   No results for input(s): "LIPASE", "AMYLASE" in the last 168 hours. No results for input(s): "AMMONIA" in the last 168 hours. Coagulation Profile: No results for input(s): "INR", "PROTIME" in the last 168 hours. Cardiac Enzymes: No results for input(s): "CKTOTAL", "CKMB", "CKMBINDEX", "TROPONINI" in the last 168 hours. BNP (last 3 results) Recent Labs    05/19/22 1204  PROBNP 380*   HbA1C: No results for input(s): "HGBA1C" in the last 72 hours.  CBG: No results for input(s): "GLUCAP" in the last 168 hours. Lipid Profile: No results for input(s): "CHOL", "HDL", "LDLCALC", "TRIG", "CHOLHDL", "LDLDIRECT" in the last 72 hours. Thyroid Function Tests: No results for input(s): "TSH", "T4TOTAL", "FREET4", "T3FREE", "THYROIDAB" in the last 72 hours. Anemia Panel: No results for input(s): "VITAMINB12", "FOLATE", "FERRITIN", "TIBC", "IRON", "RETICCTPCT" in the last 72 hours. Sepsis Labs: Recent Labs  Lab 03/05/23 4098  LATICACIDVEN 1.9    Recent Results (from the past 240 hour(s))  Gastrointestinal Panel by PCR , Stool     Status: None   Collection Time: 03/02/23  4:10 PM   Specimen: Stool  Result Value Ref Range Status   Campylobacter species NOT DETECTED NOT DETECTED Final   Plesimonas shigelloides NOT DETECTED NOT DETECTED Final   Salmonella species NOT DETECTED NOT DETECTED Final   Yersinia enterocolitica NOT DETECTED NOT DETECTED Final   Vibrio  species NOT DETECTED NOT DETECTED Final   Vibrio cholerae NOT DETECTED NOT DETECTED Final   Enteroaggregative E coli (EAEC) NOT DETECTED NOT DETECTED Final   Enteropathogenic E coli (EPEC) NOT DETECTED NOT DETECTED Final   Enterotoxigenic E coli (ETEC) NOT DETECTED NOT DETECTED Final   Shiga like toxin producing E coli (STEC) NOT DETECTED NOT DETECTED Final   Shigella/Enteroinvasive E coli (EIEC) NOT DETECTED NOT DETECTED Final   Cryptosporidium NOT DETECTED NOT DETECTED Final   Cyclospora cayetanensis NOT DETECTED NOT DETECTED Final   Entamoeba histolytica NOT DETECTED NOT DETECTED Final   Giardia lamblia NOT DETECTED NOT DETECTED Final   Adenovirus F40/41 NOT DETECTED NOT DETECTED Final   Astrovirus NOT DETECTED NOT DETECTED Final   Norovirus GI/GII NOT DETECTED NOT DETECTED Final   Rotavirus A NOT DETECTED NOT DETECTED Final  Sapovirus (I, II, IV, and V) NOT DETECTED NOT DETECTED Final    Comment: Performed at [facility], [address] [place], [place] [address]  C Difficile Quick Screen w PCR reflex     Status: None   Collection Time: 03/02/23  4:10 PM   Specimen: Stool  Result Value Ref Range Status   C Diff antigen NEGATIVE NEGATIVE Final   C Diff toxin NEGATIVE NEGATIVE Final   C Diff interpretation No C. difficile detected.  Final    Comment: Performed at [facility], [address], [place], [place] [address]  Surgical PCR screen     Status: None   Collection Time: 03/06/23 11:44 PM   Specimen: Nasal Mucosa; Nasal Swab  Result Value Ref Range Status   MRSA, PCR NEGATIVE NEGATIVE Final   Staphylococcus aureus NEGATIVE NEGATIVE Final    Comment: (NOTE) The Xpert SA Assay (FDA approved for NASAL specimens in patients 50 years of age and older), is one component of a comprehensive surveillance program. It is not intended to diagnose infection nor to guide or monitor treatment. Performed at [facility], [address], [place], [place] [address]           Radiology Studies: PERIPHERAL VASCULAR CATHETERIZATION  Result Date: 03/08/2023 See surgical note for result.       Scheduled Meds:  acetaminophen  1,000 mg Oral Q6H   Chlorhexidine Gluconate Cloth  6 each Topical Daily   enoxaparin (LOVENOX) injection  50 mg Subcutaneous Q24H   metoprolol tartrate  25 mg Oral BID   mupirocin ointment  1 Application Nasal BID   torsemide  20 mg Oral BID   Continuous Infusions:     LOS: 8 days    Time spent: 35 minutes     Dorcas Carrow, MD [facility]

## 2023-03-10 NOTE — Progress Notes (Signed)
Nutrition Brief Note  Day 8 of NPO/Clear liquid status   Visited patient at bedside whose husband was present. Patient is tolerating clear liquids-- broth, juice, italian ice. No N/V reported. Patient is beginning to make bowel in ostomy. Patient declined boost breeze and prosource plus protein today to help replete nutrition. She reports if she drinks those, she will vomit. Patient reports she knows her body.   She is still reluctant towards TPN and wishes to drink premier protein and Atkin's shakes. Per MD, she is not medically ready for such supplements, but if she continues to tolerate, she could start tomorrow perhaps. Patient reports that she can have her ONS brought from home when it is appropriate.   Leodis Rains, RDN, LDN  Clinical Nutrition

## 2023-03-10 NOTE — Consult Note (Addendum)
WOC Nurse ostomy follow up Stoma type/location: LMQ colostomy  Stomal assessment/size: 1 3/8", pale pink moist slightly above skin level, slightly oval  Peristomal assessment: intact  Treatment options for stomal/peristomal skin: 2" barrier ring  Output 50 mls bloody effluent  Ostomy pouching: 1pc. Convex Hart Rochester #[id number] placed at this visit  Education provided: Educated patient and spouse on emptying pouch when 1/3 to 1/2 full.  Husband able to demonstrate emptying pouch utilizing lock and roll mechanism and cleansing spout with toilet paper wick. Discussed changing entire pouching system 2 times a week and as needed for any leaking. Husband removed old pouch using push pull technique.  Discussed cleaning around pouch with water moistened washcloth only.  Sized stoma at 1 3/8" slightly oval today. Discussed sizing weekly for first month as edema will subside and size of stoma may change. Husband was able to cut new skin barrier and I widened out slightly oval.  Discussed and demonstrated stretching 2" barrier ring to fit snugly around stoma.  Husband able to place new pouch onto barrier ring, reviewed running finger around inner seal to meld to skin.  Husband closed new pouch using lock and roll.   Discussed emptying pouch directly into toilet at home. Showering with pouch on or off. No use of wipes or lotions on skin surrounding stoma. Reviewed educational handouts with husband including step by step instruction on 1 and 2 piece pouch change as well as educational videos available.    Enrolled patient in [place] Secure Start Discharge program: Yes today  Supplies ordered for room (6) 2" barrier rings Hart Rochester [phone], (6) 2 3/4" convex skin barriers Hart [place] [phone]), (6) 2 3/4" pouches Hart Rochester [phone]).  Also large ostomy belt at spouses request Hart [place] [phone].    WOC team will continue to follow for ostomy support and education.   Thank you,    Priscella Mann MSN, RN-BC, [employer] [phone]

## 2023-03-10 NOTE — Progress Notes (Addendum)
Patient Name: Cindy Holmes Date of Encounter: 03/10/2023 [facility] Cardiologist: Marjo Bicker, MD   Interval Summary  .    72 yr old female with PMH of CAD with hx of BMS to LAD and PTCA to D1 in 2001, repeat PTCA to D1 in 2002, HFpEF, RA, lupus, chronic immunosuppression, HTN, prior CVA, paroxysmal A fib, HLD, pulmonary hypertension, chronic diarrhea, who is admitted for AKI and acute on chronic diarrhea. Colonoscope 8/23 showed sigmoid stricture. She is POD #1 Sigmoid colectomy with Hartman's procedure and end colostomy, small bowel resection.   Cardiology is following for post op monitor given significant cardiac history.   Patient states she feels improved, no chest pain or SOB, but felt her legs are much more swollen than usual, does not walk a lot at baseline.    Vital Signs .    Vitals:   03/10/23 0023 03/10/23 0418 03/10/23 0517 03/10/23 0847  BP:   126/66 (!) 122/51  Pulse:   83 86  Resp: 16 18 15 16   Temp:   97.7 F (36.5 C) 97.6 F (36.4 C)  TempSrc:   Oral Oral  SpO2: 97% 100% 100% 100%  Weight:      Height: 5\' 3"  (1.6 m)       Intake/Output Summary (Last 24 hours) at 03/10/2023 1049 Last data filed at 03/10/2023 0600 Gross per 24 hour  Intake 1459.51 ml  Output 650 ml  Net 809.51 ml      03/08/2023   10:18 AM 03/02/2023   11:15 AM 02/02/2023    1:56 PM  Last 3 Weights  Weight (lbs) 220 lb 229 lb 15 oz 230 lb  Weight (kg) 99.791 kg 104.3 kg 104.327 kg      Telemetry/ECG    Sinus rhythm, sinus tachycardia 100s noted in the past 24 hours  - Personally Reviewed  Physical Exam .    GEN: No acute distress.  Obese  Neck: No JVD Cardiac: RRR, no murmurs, rubs, or gallops.  Respiratory: Clear to auscultation bilaterally. On room air.  GI: Soft, nontender, non-distended  MS: 1+ BLE edema not signifcantly changed   Assessment & Plan .     Paroxysmal A fib - telemetry showed no recurrence of A fib or SVT or AT in the past 24 hours,  continue monitor  - BP stable not on midodrine anymore, resumed PTA metoprolol (on 75mg  BID at home) at 25mg  BID, may up-tritiate if BP tolerates  - PTA Eliquis held since 03/03/23 for surgery, resume upon clearance from surgery team, no bridge needed - please keep Mag >2 and K >4   Chronic diastolic heart failure  Pulmonary HTN  - Echo with LVEF 65-70% on 03/05/23 , grade II DD, normal RV, severely elevated PSAP 61.5 mmHg  CAD, mild to mod TR - respiratory exam stable, BLE 1+ edema remains,  (on PTA lasix 60mg  AM and 40mg  PM), she felt her edema is worsen today, consider pause IVF and give IV Lasix 40mg x1, may resume PO dose if PO intake resumes  - GDMT: resume metoprolol today, spironolactone held, not candidate for SGLT2i due to hx of UTI - RHC can be considered outpatient in a  few month   CAD s/p PCI to LAD with BMS and POBA to D1 x2 - Last myoview 03/2021 low risk with no ischemia or infarction. - she has no chest pain - ASA 81mg  held, resume upon clerance by surgery; resume metoprolol; may resume PTA ramipril if AKI  resolves and BP allows; may resume PTA lipitor and zetia when PO intake is regular  - transfuse to keep Hgb >8   Sigmoid stricture  Acute on chronic diarrhea AKI  Obesity  RA Anemia  - per primary team    For questions or updates, please contact [facility] Please consult [email or web address] for contact info under   Signed, Cyndi Bender, NP    History and all data above reviewed.  Patient examined.  I agree with the findings as above.  She is not describing SOB and no chest pain.  The patient exam reveals COR:RRR  ,  Lungs: Decreased breath sounds  ,  Abd: Positive bowel sounds, no rebound no guarding, Ext severe edema  .  All available labs, radiology testing, previous records reviewed. Agree with documented assessment and plan.    Edema:  I talked with her about compression stockings but she has not tolerated them in the past.  I will start some PO diuretic.   Resume beta blocker.  CAD:  No acute ischemic symptoms.  Continue medical management.     Fayrene Fearing Glada Wickstrom  11:15 AM  03/10/2023

## 2023-03-11 ENCOUNTER — Encounter (HOSPITAL_COMMUNITY): Payer: Medicare Other

## 2023-03-11 DIAGNOSIS — I480 Paroxysmal atrial fibrillation: Secondary | ICD-10-CM | POA: Diagnosis not present

## 2023-03-11 DIAGNOSIS — N179 Acute kidney failure, unspecified: Secondary | ICD-10-CM | POA: Diagnosis not present

## 2023-03-11 DIAGNOSIS — I5032 Chronic diastolic (congestive) heart failure: Secondary | ICD-10-CM | POA: Diagnosis not present

## 2023-03-11 MED ORDER — DOCUSATE SODIUM 100 MG PO CAPS
100.0000 mg | ORAL_CAPSULE | Freq: Two times a day (BID) | ORAL | Status: DC
  Administered 2023-03-11 – 2023-03-21 (×11): 100 mg via ORAL
  Filled 2023-03-11 (×16): qty 1

## 2023-03-11 MED ORDER — OXYCODONE HCL 5 MG PO TABS
2.5000 mg | ORAL_TABLET | ORAL | Status: DC | PRN
  Administered 2023-03-11: 5 mg via ORAL
  Administered 2023-03-11: 2.5 mg via ORAL
  Administered 2023-03-15 (×2): 5 mg via ORAL
  Filled 2023-03-11 (×5): qty 1

## 2023-03-11 MED ORDER — ENSURE ENLIVE PO LIQD
237.0000 mL | Freq: Two times a day (BID) | ORAL | Status: DC
  Administered 2023-03-11 – 2023-03-18 (×4): 237 mL via ORAL

## 2023-03-11 MED ORDER — HYDROMORPHONE HCL 1 MG/ML IJ SOLN: 0.5000 mg | Freq: Three times a day (TID) | INTRAMUSCULAR | Status: DC | PRN

## 2023-03-11 NOTE — Plan of Care (Signed)
  Problem: Education: Goal: Knowledge of General Education information will improve Description: Including pain rating scale, medication(s)/side effects and non-pharmacologic comfort measures Outcome: Progressing   Problem: Health Behavior/Discharge Planning: Goal: Ability to manage health-related needs will improve Outcome: Progressing   Problem: Clinical Measurements: Goal: Ability to maintain clinical measurements within normal limits will improve Outcome: Progressing Goal: Will remain free from infection Outcome: Progressing Goal: Diagnostic test results will improve Outcome: Progressing Goal: Respiratory complications will improve Outcome: Progressing Goal: Cardiovascular complication will be avoided Outcome: Progressing   Problem: Nutrition: Goal: Adequate nutrition will be maintained 03/11/2023 2005 by Irwin Brakeman, RN Outcome: Progressing 03/11/2023 1853 by Irwin Brakeman, RN Outcome: Progressing   Problem: Coping: Goal: Level of anxiety will decrease Outcome: Progressing   Problem: Elimination: Goal: Will not experience complications related to bowel motility Outcome: Progressing Goal: Will not experience complications related to urinary retention Outcome: Progressing   Problem: Pain Managment: Goal: General experience of comfort will improve Outcome: Progressing   Problem: Safety: Goal: Ability to remain free from injury will improve Outcome: Progressing   Problem: Skin Integrity: Goal: Risk for impaired skin integrity will decrease Outcome: Progressing   Problem: Education: Goal: Understanding of discharge needs will improve Outcome: Progressing Goal: Verbalization of understanding of the causes of altered bowel function will improve Outcome: Progressing   Problem: Bowel/Gastric: Goal: Gastrointestinal status for postoperative course will improve Outcome: Progressing   Problem: Health Behavior/Discharge Planning: Goal:  Identification of community resources to assist with postoperative recovery needs will improve Outcome: Progressing   Problem: Nutritional: Goal: Will attain and maintain optimal nutritional status will improve Outcome: Progressing   Problem: Clinical Measurements: Goal: Postoperative complications will be avoided or minimized Outcome: Progressing   Problem: Respiratory: Goal: Respiratory status will improve Outcome: Progressing   Problem: Skin Integrity: Goal: Will show signs of wound healing Outcome: Progressing   Problem: Activity: Goal: Risk for activity intolerance will decrease 03/11/2023 2005 by Irwin Brakeman, RN Outcome: Not Progressing 03/11/2023 1853 by Irwin Brakeman, RN Outcome: Not Progressing   Problem: Activity: Goal: Ability to tolerate increased activity will improve Outcome: Not Progressing

## 2023-03-11 NOTE — Discharge Instructions (Signed)

## 2023-03-11 NOTE — Progress Notes (Signed)
Progress Note  Patient Name: Cindy Holmes Date of Encounter: 03/11/2023  Primary Cardiologist:   Marjo Bicker, MD   Subjective   Seems to be less uncomfortable today. No acute SOB or pain.    Inpatient Medications    Scheduled Meds:  acetaminophen  1,000 mg Oral Q6H   Chlorhexidine Gluconate Cloth  6 each Topical Daily   enoxaparin (LOVENOX) injection  50 mg Subcutaneous Q24H   metoprolol tartrate  25 mg Oral BID   mupirocin ointment  1 Application Nasal BID   torsemide  20 mg Oral BID   Continuous Infusions:  PRN Meds: ipratropium-albuterol, methocarbamol, ondansetron (ZOFRAN) IV, prochlorperazine   Vital Signs    Vitals:   03/10/23 1643 03/10/23 2059 03/11/23 0518 03/11/23 0802  BP: (!) 100/46 (!) 119/40 (!) 121/55 (!) 127/48  Pulse: 88 96 (!) 104 91  Resp: 19  16 16   Temp: 98.1 F (36.7 C) 97.6 F (36.4 C) 98 F (36.7 C) 97.7 F (36.5 C)  TempSrc: Oral  Oral Oral  SpO2: 98% 95% 100% 98%  Weight:      Height:        Intake/Output Summary (Last 24 hours) at 03/11/2023 0910 Last data filed at 03/11/2023 0520 Gross per 24 hour  Intake 474.64 ml  Output 1200 ml  Net -725.36 ml   Filed Weights   03/02/23 1115 03/08/23 1018  Weight: 104.3 kg 99.8 kg    Telemetry    NSR with short runs of atrial tachycardia - Personally Reviewed  ECG    NA - Personally Reviewed  Physical Exam   GEN: No acute distress.   Neck: No  JVD Cardiac: RRR, no murmurs, rubs, or gallops.  Respiratory: Clear  to auscultation bilaterally. GI: Soft, nontender, non-distended  MS:    Moderate leg edema; No deformity. Neuro:  Nonfocal  Psych: Normal affect   Labs    Chemistry Recent Labs  Lab 03/06/23 0624 03/07/23 0459 03/08/23 1849 03/09/23 0513 03/10/23 0458  NA 131*   < > 134* 132* 134*  K 4.0   < > 3.8 4.3 4.3  CL 103   < > 104 105 104  CO2 21*   < > 23 15* 19*  GLUCOSE 131*   < > 137* 133* 123*  BUN 22   < > 17 17 18   CREATININE 1.41*   < >  1.33* 1.21* 1.13*  CALCIUM 7.6*   < > 7.6* 7.3* 7.6*  PROT 4.9*  --   --   --   --   ALBUMIN 2.2*  --   --   --   --   AST 12*  --   --   --   --   ALT 9  --   --   --   --   ALKPHOS 52  --   --   --   --   BILITOT 0.5  --   --   --   --   GFRNONAA 40*   < > 43* 48* 52*  ANIONGAP 7   < > 7 12 11    < > = values in this interval not displayed.     Hematology Recent Labs  Lab 03/06/23 0624 03/09/23 0524 03/10/23 0458  WBC 4.4 10.6* 10.4  RBC 2.94* 2.87* 3.12*  HGB 8.9* 8.4* 9.2*  HCT 27.6* 26.4* 30.0*  MCV 93.9 92.0 96.2  MCH 30.3 29.3 29.5  MCHC 32.2 31.8 30.7  RDW 17.9* 18.0*  17.9*  PLT 241 267 262    Cardiac EnzymesNo results for input(s): "TROPONINI" in the last 168 hours. No results for input(s): "TROPIPOC" in the last 168 hours.   BNP Recent Labs  Lab 03/05/23 0459  BNP 369.4*     DDimer No results for input(s): "DDIMER" in the last 168 hours.   Radiology    No results found.  Cardiac Studies   Echo 1. Left ventricular ejection fraction, by estimation, is 65 to 70%. The  left ventricle has normal function. The left ventricle has no regional  wall motion abnormalities. Left ventricular diastolic parameters are  consistent with Grade II diastolic  dysfunction (pseudonormalization).   2. Right ventricular systolic function is normal. The right ventricular  size is dilated. There is severely elevated pulmonary artery systolic  pressure. The estimated right ventricular systolic pressure is 61.5 mmHg.   3. Left atrial size was mildly dilated.   4. The mitral valve is normal in structure. Mild to moderate mitral valve  regurgitation.   5. Multiple jets. Tricuspid valve regurgitation is mild to moderate.   6. The aortic valve is tricuspid. Aortic valve regurgitation is not  visualized. No aortic stenosis is present.   7. The inferior vena cava is dilated in size with <50% respiratory  variability, suggesting right atrial pressure of 15 mmHg.   Patient  Profile     72 y.o. female with hx of BMS to LAD and PTCA to D1 in 2001, repeat PTCA to D1 in 2002, HFpEF, RA, lupus, chronic immunosuppression, HTN, prior CVA, paroxysmal A fib, HLD, pulmonary hypertension, chronic diarrhea, who is admitted for AKI and acute on chronic diarrhea. Colonoscope 8/23 showed sigmoid stricture. She is POD #1 Sigmoid colectomy with Hartman's procedure and end colostomy, small bowel resection. Cardiology is following for post op monitor given significant cardiac history.   Assessment & Plan    PAF: Maintaining NSR with short runs of atrial tach..  On low dose beta blocker.   CAD:   No active acute coronary ischemia evident.  No post op events.  Continue medical management.    Chronic diastolic HF:  Started PO diuretic yesterday.   I will order intake and output and daily weights.  Creat tolerated . Edema is improved.  Continue diuretic today and likely reduce dose in the AM.   For questions or updates, please contact [facility] Please consult [email or web address] for contact info under Cardiology/STEMI.   Signed, Rollene Rotunda, MD  03/11/2023, 9:10 AM

## 2023-03-11 NOTE — Progress Notes (Signed)
[facility] Progress Note  3 Days Post-Op  Subjective: CC-  Started passing gas per ostomy yesterday. Tolerating sips of liquids. Denies n/v. Only took pain medication twice.   Objective: Vital signs in last 24 hours: Temp:  [97.6 F (36.4 C)-98.1 F (36.7 C)] 97.7 F (36.5 C) (08/30 0802) Pulse Rate:  [88-104] 91 (08/30 0802) Resp:  [16-19] 16 (08/30 0802) BP: (100-127)/(40-55) 127/48 (08/30 0802) SpO2:  [95 %-100 %] 98 % (08/30 0802) Last BM Date : 03/09/23  Intake/Output from previous day: 08/29 0701 - 08/30 0700 In: 474.6 [I.V.:474.6] Out: 1450 [Urine:1450] Intake/Output this shift: Total I/O In: -  Out: 125 [Urine:125]  PE: Gen:  Alert, NAD Abd: soft, ND, appropriately tender, + bowel sounds, ostomy a little retracted but viable with no output, midline wound with honeycomb dressing present and small amount of bloody drainage  Lab Results:  Recent Labs    03/09/23 0524 03/10/23 0458  WBC 10.6* 10.4  HGB 8.4* 9.2*  HCT 26.4* 30.0*  PLT 267 262   BMET Recent Labs    03/09/23 0513 03/10/23 0458  NA 132* 134*  K 4.3 4.3  CL 105 104  CO2 15* 19*  GLUCOSE 133* 123*  BUN 17 18  CREATININE 1.21* 1.13*  CALCIUM 7.3* 7.6*   PT/INR No results for input(s): "LABPROT", "INR" in the last 72 hours. CMP     Component Value Date/Time   NA 134 (L) 03/10/2023 0458   NA 141 08/13/2022 1304   K 4.3 03/10/2023 0458   CL 104 03/10/2023 0458   CO2 19 (L) 03/10/2023 0458   GLUCOSE 123 (H) 03/10/2023 0458   BUN 18 03/10/2023 0458   BUN 19 08/13/2022 1304   CREATININE 1.13 (H) 03/10/2023 0458   CREATININE 0.68 11/13/2015 1047   CALCIUM 7.6 (L) 03/10/2023 0458   PROT 4.9 (L) 03/06/2023 0624   PROT 6.3 08/13/2022 1304   ALBUMIN 2.2 (L) 03/06/2023 0624   ALBUMIN 4.4 08/13/2022 1304   AST 12 (L) 03/06/2023 0624   ALT 9 03/06/2023 0624   ALKPHOS 52 03/06/2023 0624   BILITOT 0.5 03/06/2023 0624   BILITOT 0.5 08/13/2022 1304   GFRNONAA 52 (L)  03/10/2023 0458   GFRAA >60 12/11/2018 1222   Lipase     Component Value Date/Time   LIPASE 50 03/02/2023 1119       Studies/Results: No results found.  Anti-infectives: Anti-infectives (From admission, onward)    Start     Dose/Rate Route Frequency Ordered Stop   03/08/23 1204  sodium chloride 0.9 % with cefoTEtan (CEFOTAN) ADS Med       Note to Pharmacy: [facility]y [place] L: cabinet override      03/08/23 1204 03/08/23 1247   03/07/23 0600  cefoTEtan (CEFOTAN) 2 g in sodium chloride 0.9 % 100 mL IVPB  Status:  Discontinued        2 g 200 mL/hr over 30 Minutes Intravenous On call to O.R. 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 - surgical path: Segment of colon (11.5 cm) showing diverticulosis, diverticulitis with  associated stricture and serosal adhesions, adherent benign unremarkable ovary, margins appear viable; small intestine with serosal adhesions and associated  reactive changes, margins appear viable  - Ok for full liquids, Ensure - mobilize, PT, IS - WOC following for new ostomy. Will need ostomy clinic referral at discharge, order has been placed - continue scheduled tylenol, add oral oxy PRN with dilaudid only for breakthrough pain   ID - cefotetan periop FEN - IVF, FLD VTE - SCDs, lovenox Foley - Can remove  from surgical standpoint when appropriate by cards and medicine   CAD - eliquis on Hold CHF HTN HLD RA on Plaquenil and methotrexate.  Currently on hold Morbid obesity Anemia     LOS: 9 days    Franne Forts, [facility] 03/11/2023, 10:47 AM Please see Amion for pager number during day hours 7:00am-4:30pm

## 2023-03-11 NOTE — Progress Notes (Signed)
PROGRESS NOTE    Cindy Holmes  ZOX:[id number] DOB: 05-29-1951 DOA: 03/02/2023 PCP: [name], MD    Brief Narrative:  72 year old with hypertension, rheumatoid arthritis on immunosuppressive's, coronary artery disease, chronic diastolic heart failure, history of stroke presented to the ER with persistent diarrhea, nausea and vomiting.  Underwent colonoscopy and found to have sigmoid colon stenosis.  Surgery consulted.  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   Assessment & Plan:   Sigmoid stricture, overflow diarrhea with recent history of enteropathogenic E. coli: C. difficile negative.  Repeat GI panel negative.  Colonoscopy consistent with sigmoid colon narrowing.  Due to bowel obstruction, Underwent exploratory laparotomy and resection with colostomy.  Postsurgical management as per surgery. Clinically improving.  Started on full liquid diet.  Oral medication for pain management.  Start mobilizing.  Acute kidney injury: multifactorial.  Renal function stable.  Tolerating torsemide.  Paroxysmal A-fib: Rate controlled.  On metoprolol.  Eliquis on hold.  Will start as soon as okay with surgery.  Rheumatoid arthritis: On methotrexate and Plaquenil on hold for procedure and wound healing.  Hyperlipidemia: On statin.  Acute on chronic diastolic heart failure with pulmonary hypertension, bilateral leg edema, vascular congestion: Echocardiogram with ejection fraction of 70%.  Severely elevated pulmonary artery systolic pressures.  Cardiology has seen patient preop.  Started on torsemide.  Anemia of chronic disease: Hemoglobin stable at around 8-9.  Will continue to monitor.   DVT prophylaxis: SCD.   Code Status: Full code Family Communication: No family at bedside in the morning rounds. Disposition Plan: Status is: Inpatient Remains inpatient appropriate because: Postoperative.     Consultants:  General surgery Cardiology GI  Procedures:  Colonoscopy 8/23  end  colostomy 8/27  Antimicrobials:  Completed   Subjective:  Patient seen and examined.  In the morning rounds she was hungry.  Patient tells me that she does not have much pain and she needed to quit.  Reported passing flatus through the colostomy.  No BM in the colostomy.  Leg swelling is better now. Nursing reported left lower extremity to be cooler than right.  She does not have any palpable pulses, colder to touch.  Patient stated it is chronic.  Doppler signals present on both dorsalis pedis and anterior.  Objective: Vitals:   03/10/23 2059 03/11/23 0518 03/11/23 0802 03/11/23 1128  BP: (!) 119/40 (!) 121/55 (!) 127/48 (!) 106/41  Pulse: 96 (!) 104 91 76  Resp:  16 16 16   Temp: 97.6 F (36.4 C) 98 F (36.7 C) 97.7 F (36.5 C) 98.4 F (36.9 C)  TempSrc:  Oral Oral   SpO2: 95% 100% 98% 98%  Weight:      Height:        Intake/Output Summary (Last 24 hours) at 03/11/2023 1303 Last data filed at 03/11/2023 1130 Gross per 24 hour  Intake --  Output 1625 ml  Net -1625 ml   Filed Weights   03/02/23 1115 03/08/23 1018  Weight: 104.3 kg 99.8 kg    Examination:  General: Looks comfortable.  Frail debilitated and chronically sick looking. On room air today. Cardiovascular: S1-S2 normal.  Irregularly irregular. Respiratory: No added sounds. Gastrointestinal: Soft.  Pendulous.  Obese.  Midline incision clean and dry.  Left lower quadrant stoma with pink mucous membranes, dry back. Ext: 1+ edema both legs. Left leg with normal color comparable to right.  Slightly colder in touch.  Pulses are not palpable.  Strong signals in the Dopplers for dorsalis pedis and posterior tibialis.  Will check ABI.     Data Reviewed: I have personally reviewed following labs and imaging studies  CBC: Recent Labs  Lab 03/05/23 0459 03/06/23 0624 03/09/23 0524 03/10/23 0458  WBC 6.3 4.4 10.6* 10.4  NEUTROABS 3.3 3.3  --   --   HGB 8.4* 8.9* 8.4* 9.2*  HCT 26.4* 27.6* 26.4* 30.0*  MCV  91.3 93.9 92.0 96.2  PLT 220 241 267 262   Basic Metabolic Panel: Recent Labs  Lab 03/05/23 0459 03/05/23 1925 03/06/23 0624 03/07/23 0459 03/08/23 1849 03/09/23 0513 03/09/23 0524 03/10/23 0458  NA 131*  --  131* 131* 134* 132*  --  134*  K 2.7*   < > 4.0 3.5 3.8 4.3  --  4.3  CL 102  --  103 103 104 105  --  104  CO2 19*  --  21* 19* 23 15*  --  19*  GLUCOSE 89  --  131* 93 137* 133*  --  123*  BUN 21  --  22 21 17 17   --  18  CREATININE 1.21*  --  1.41* 1.46* 1.33* 1.21*  --  1.13*  CALCIUM 7.6*  --  7.6* 7.7* 7.6* 7.3*  --  7.6*  MG 1.9  --  2.0  --   --   --  1.9 2.1   < > = values in this interval not displayed.   GFR: Estimated Creatinine Clearance: 51.5 mL/min (A) (by C-G formula based on SCr of 1.13 mg/dL (H)). Liver Function Tests: Recent Labs  Lab 03/06/23 0624  AST 12*  ALT 9  ALKPHOS 52  BILITOT 0.5  PROT 4.9*  ALBUMIN 2.2*   No results for input(s): "LIPASE", "AMYLASE" in the last 168 hours. No results for input(s): "AMMONIA" in the last 168 hours. Coagulation Profile: No results for input(s): "INR", "PROTIME" in the last 168 hours. Cardiac Enzymes: No results for input(s): "CKTOTAL", "CKMB", "CKMBINDEX", "TROPONINI" in the last 168 hours. BNP (last 3 results) Recent Labs    05/19/22 1204  PROBNP 380*   HbA1C: No results for input(s): "HGBA1C" in the last 72 hours.  CBG: No results for input(s): "GLUCAP" in the last 168 hours. Lipid Profile: No results for input(s): "CHOL", "HDL", "LDLCALC", "TRIG", "CHOLHDL", "LDLDIRECT" in the last 72 hours. Thyroid Function Tests: No results for input(s): "TSH", "T4TOTAL", "FREET4", "T3FREE", "THYROIDAB" in the last 72 hours. Anemia Panel: No results for input(s): "VITAMINB12", "FOLATE", "FERRITIN", "TIBC", "IRON", "RETICCTPCT" in the last 72 hours. Sepsis Labs: Recent Labs  Lab 03/05/23 0865  LATICACIDVEN 1.9    Recent Results (from the past 240 hour(s))  Gastrointestinal Panel by PCR , Stool      Status: None   Collection Time: 03/02/23  4:10 PM   Specimen: Stool  Result Value Ref Range Status   Campylobacter species NOT DETECTED NOT DETECTED Final   Plesimonas shigelloides NOT DETECTED NOT DETECTED Final   Salmonella species NOT DETECTED NOT DETECTED Final   Yersinia enterocolitica NOT DETECTED NOT DETECTED Final   Vibrio species NOT DETECTED NOT DETECTED Final   Vibrio cholerae NOT DETECTED NOT DETECTED Final   Enteroaggregative E coli (EAEC) NOT DETECTED NOT DETECTED Final   Enteropathogenic E coli (EPEC) NOT DETECTED NOT DETECTED Final   Enterotoxigenic E coli (ETEC) NOT DETECTED NOT DETECTED Final   Shiga like toxin producing E coli (STEC) NOT DETECTED NOT DETECTED Final   Shigella/Enteroinvasive E coli (EIEC) NOT DETECTED NOT DETECTED Final   Cryptosporidium NOT DETECTED NOT DETECTED Final  Cyclospora cayetanensis NOT DETECTED NOT DETECTED Final   Entamoeba histolytica NOT DETECTED NOT DETECTED Final   Giardia lamblia NOT DETECTED NOT DETECTED Final   Adenovirus F40/41 NOT DETECTED NOT DETECTED Final   Astrovirus NOT DETECTED NOT DETECTED Final   Norovirus GI/GII NOT DETECTED NOT DETECTED Final   Rotavirus A NOT DETECTED NOT DETECTED Final   Sapovirus (I, II, IV, and V) NOT DETECTED NOT DETECTED Final    Comment: Performed at [facility], [address] [place], [place] [address]  C Difficile Quick Screen w PCR reflex     Status: None   Collection Time: 03/02/23  4:10 PM   Specimen: Stool  Result Value Ref Range Status   C Diff antigen NEGATIVE NEGATIVE Final   C Diff toxin NEGATIVE NEGATIVE Final   C Diff interpretation No C. difficile detected.  Final    Comment: Performed at [facility], [address] [place], [place] [address]  Surgical PCR screen     Status: None   Collection Time: 03/06/23 11:44 PM   Specimen: Nasal Mucosa; Nasal Swab  Result Value Ref Range Status   MRSA, PCR NEGATIVE NEGATIVE Final   Staphylococcus aureus NEGATIVE  NEGATIVE Final    Comment: (NOTE) The Xpert SA Assay (FDA approved for NASAL specimens in patients 2 years of age and older), is one component of a comprehensive surveillance program. It is not intended to diagnose infection nor to guide or monitor treatment. Performed at [facility], [address] [place], [place] [address]          Radiology Studies: No results found.      Scheduled Meds:  acetaminophen  1,000 mg Oral Q6H   Chlorhexidine Gluconate Cloth  6 each Topical Daily   docusate sodium  100 mg Oral BID   enoxaparin (LOVENOX) injection  50 mg Subcutaneous Q24H   feeding supplement  237 mL Oral BID BM   metoprolol tartrate  25 mg Oral BID   mupirocin ointment  1 Application Nasal BID   torsemide  20 mg Oral BID   Continuous Infusions:     LOS: 9 days    Time spent: 35 minutes     Dorcas Carrow, MD [facility]

## 2023-03-11 NOTE — TOC Progression Note (Signed)
Transition of Care [facility]) - Progression Note    Patient Details  Name: Cindy Holmes MRN: [id number] Date of Birth: 23-Jul-1950  [facility] of [facility]) CM/SW Contact  Tom-Johnson, Hershal Coria, RN Phone Number: 03/11/2023, 3:10 PM  Clinical Narrative:     CM spoke with patient and spouse at bedside about Home Health recommendation.  Patient declined Home health stating her husband will provide the care she needs. Husband verified he will care for patient. Requests Hospital bed and [facility] lift. Order called to [employer] and Earna Coder to deliver to patient's home.   Patient not Medically ready for discharge.  CM will continue to follow as patient progresses with care towards discharge.      Expected Discharge Plan: Home w Home Health Services Barriers to Discharge: Continued Medical Work up  Expected Discharge Plan and Services                         DME Arranged: Hospital bed [facility] lift) DME Agency: [employer] Date DME Agency Contacted: 03/11/23 Time DME Agency Contacted: [phone] Representative spoke with at DME Agency: Earna Coder HH Arranged: Refused HH HH Agency: NA         Social Determinants of Health (SDOH) Interventions SDOH Screenings   Food Insecurity: No Food Insecurity (03/05/2023)  Housing: Low Risk  (03/05/2023)  Transportation Needs: No Transportation Needs (03/05/2023)  Utilities: Not At Risk (03/05/2023)  Tobacco Use: Low Risk  (03/08/2023)    Readmission Risk Interventions    03/03/2023    3:06 PM 02/03/2023    3:58 PM  Readmission Risk Prevention Plan  Transportation Screening Complete Complete  PCP or Specialist Appt within 3-5 Days Complete Complete  HRI or Home Care Consult Complete Complete  Social Work Consult for Recovery Care Planning/Counseling Complete Complete  Palliative Care Screening Not Applicable Not Applicable  Medication Review Oceanographer) Referral to Pharmacy Referral to Pharmacy

## 2023-03-11 NOTE — Progress Notes (Signed)
PT Cancellation Note  Patient Details Name: Cindy Holmes MRN: [id number] DOB: 20-Jan-1951   Cancelled Treatment:    Reason Eval/Treat Not Completed: (P) Patient declined, no reason specified (RN and PTA in room to provide encouragement for pt to mobilize OOB after premedication for pain. Pt adamant that she is not getting up, despite education on benefits of mobility/risks of immobility post-op. Pt states she won't get up before 4pm.) PTA paged out to therapy team to see if another therapist can pick up later in the day. Will continue efforts per PT plan of care as schedule permits.   [name] Livers M Valerie Cones 03/11/2023, 2:02 PM

## 2023-03-11 NOTE — Plan of Care (Signed)
  Problem: Nutrition: Goal: Adequate nutrition will be maintained Outcome: Progressing   Problem: Activity: Goal: Risk for activity intolerance will decrease Outcome: Not Progressing   Pt tolerating small amounts of full liquid diet. Despite education to prevent respiratory compromise, skin problems, and help her discharge to home.  Pt is adamant that she will not try to even sit on side of bed and will certainly not sit in chair. She said it will take 4 grown men to get her out of bed. She wants Michiel Sites ordered for home which is in process.

## 2023-03-12 ENCOUNTER — Encounter (HOSPITAL_COMMUNITY): Payer: Medicare Other

## 2023-03-12 DIAGNOSIS — N179 Acute kidney failure, unspecified: Secondary | ICD-10-CM | POA: Diagnosis not present

## 2023-03-12 LAB — BASIC METABOLIC PANEL
Anion gap: 8 (ref 5–15)
BUN: 18 mg/dL (ref 8–23)
CO2: 21 mmol/L — ABNORMAL LOW (ref 22–32)
Calcium: 7.9 mg/dL — ABNORMAL LOW (ref 8.9–10.3)
Chloride: 104 mmol/L (ref 98–111)
Creatinine, Ser: 1.25 mg/dL — ABNORMAL HIGH (ref 0.44–1.00)
GFR, Estimated: 46 mL/min — ABNORMAL LOW (ref >60)
Glucose, Bld: 95 mg/dL (ref 70–99)
Potassium: 3.4 mmol/L — ABNORMAL LOW (ref 3.5–5.1)
Sodium: 133 mmol/L — ABNORMAL LOW (ref 135–145)

## 2023-03-12 MED ORDER — POTASSIUM CHLORIDE CRYS ER 20 MEQ PO TBCR
40.0000 meq | EXTENDED_RELEASE_TABLET | Freq: Every day | ORAL | Status: DC
  Administered 2023-03-12: 40 meq via ORAL
  Filled 2023-03-12: qty 2

## 2023-03-12 MED ORDER — ORAL CARE MOUTH RINSE: 15.0000 mL | OROMUCOSAL | Status: DC | PRN

## 2023-03-12 NOTE — Plan of Care (Signed)
  Problem: Education: Goal: Knowledge of General Education information will improve Description: Including pain rating scale, medication(s)/side effects and non-pharmacologic comfort measures Outcome: Progressing   Problem: Clinical Measurements: Goal: Ability to maintain clinical measurements within normal limits will improve Outcome: Progressing Goal: Will remain free from infection Outcome: Progressing   Problem: Nutrition: Goal: Adequate nutrition will be maintained Outcome: Progressing   Problem: Elimination: Goal: Will not experience complications related to bowel motility Outcome: Progressing   Problem: Pain Managment: Goal: General experience of comfort will improve Outcome: Progressing

## 2023-03-12 NOTE — Progress Notes (Signed)
Physical Therapy Treatment Patient Details Name: Cindy Holmes MRN: [id number] DOB: 23-Oct-1950 Today's Date: 03/12/2023   History of Present Illness Pt is 72 yo presenting with persistent diarrhea, nausea and vomiting. Ex-lap with sigmoid colectomy and small bowel resection on 8/27. PMH: HTN, RA, CAD chronic HFpEF and hx CVA. Pt was recently discharged from hospital to home for similar issue.    PT Comments  Patient resting in bed and agreeable to attempt mobility with PT despite pt's multiple self reported limitations. Extensive time spent discussing pt's decline in mobility, lack of participation in therapy, and importance of mobilizing to reduce secondary complications of post-operative immobility. Patient demonstrated ability to lift LE's off bed partially when therapist putting socks on pt, however when asked to initiate lifting LE's to move off EOB pt reports unable to due to weakness. Following cues/education pt activated quads for lower leg lifts and Max assist provided to move LE's off edge. Max assist and cues to reach for bed rail and pt raised trunk to sit up ~75% of the way. Bed pad used to pivot hips to place 1 foot on floor. Pt unable to tolerate sitting upright for <30 seconds and requesting/initiating return to supine. EOS pt repositioned in bed and partial chair position and instructed on LE exercises to complete throughout day. Discussed concerns regarding physical assist level pt requires currently and level spouse can safely provide. At current level pt will benefit from increased assist and 24/7 care. Updated discharge recommendations. Patient will benefit from continued inpatient follow up therapy, <3 hours/day. Will progress as able.    If plan is discharge home, recommend the following: Two people to help with walking and/or transfers;Two people to help with bathing/dressing/bathroom;Assistance with cooking/housework;Direct supervision/assist for medications management;Assist  for transportation;Help with stairs or ramp for entrance   Can travel by private vehicle        Equipment Recommendations  [place] lift;Hospital bed    Recommendations for Other Services       Precautions / Restrictions Precautions Precautions: Fall Precaution Comments: colostomy Restrictions Weight Bearing Restrictions: No     Mobility  Bed Mobility Overal bed mobility: Needs Assistance Bed Mobility: Supine to Sit, Sit to Supine     Supine to sit: HOB elevated, Used rails, Max assist Sit to supine: Max assist, HOB elevated   General bed mobility comments: Pt able to kick LE's with quad activation bil, max assist to move LE's towards EOB. Max assist to guide Rt UE to reach Lt hand on bed rail and bed pad used to pivot hips to EOB. Max Assist to fully raise trunk upright and pt remained sitting~75% upright for <30 seconds before requesting return to supine and beginning to lower trunk and raise LE's onto bed. Max assist ultimately to bring LE's onto bed. Total Assist to boost pt superiorly in bed with cues for pt to flex bil LE's and kick through legs to assist with superior boost.    Transfers                   General transfer comment: unable to maintain sitting EOB long enough to attempt transfers. will need dependent lift transfer.    Ambulation/Gait                   Stairs             Wheelchair Mobility     Tilt Bed    Modified Rankin (Stroke Patients Only)  Balance                                            Cognition Arousal: Alert Behavior During Therapy: Flat affect Overall Cognitive Status: Impaired/Different from baseline Area of Impairment: Problem solving, Following commands, Safety/judgement, Awareness                       Following Commands: Follows one step commands inconsistently, Follows multi-step commands inconsistently Safety/Judgement: Decreased awareness of deficits Awareness:  Intellectual (no real awareness of deficits and current level of needs/care/assist if she were to go home.) Problem Solving: Slow processing, Requires verbal cues, Decreased initiation, Requires tactile cues General Comments: pt self limiting, lots of reasons she cannot complete tasks (too sick, will do better when stronger, too much pain, wanted to eat foot but now doesn't want to eat, nauseous, spouse will do it for her). multiple times pt stating to spouse "tell her" "tell her why I can't do it" spouse did not respond to these statement. Patient with little to no intrinsic motivators to attempt mobility.        Exercises General Exercises - Lower Extremity Ankle Circles/Pumps: Both, 10 reps, Supine, AROM Gluteal Sets: Both, 10 reps, Supine, AROM Short Arc Quad: Both, 10 reps, Supine, AROM    General Comments        Pertinent Vitals/Pain Pain Assessment Pain Assessment: Faces Faces Pain Scale: Hurts even more Pain Location: abdomen Pain Descriptors / Indicators: Sore, Discomfort, Grimacing Pain Intervention(s): Limited activity within patient's tolerance, Monitored during session, Repositioned    Home Living Family/patient expects to be discharged to:: Private residence Living Arrangements: Spouse/significant other Available Help at Discharge: Family;Available 24 hours/day Type of Home: House Home Access: Ramped entrance       Home Layout: One level Home Equipment: Wheelchair - Forensic psychologist (2 wheels);Cane - single point;Tub bench Additional Comments: Pt uses scooter    Prior Function            PT Goals (current goals can now be found in the care plan section) Acute Rehab PT Goals Patient Stated Goal: to go home PT Goal Formulation: With patient/family Time For Goal Achievement: 03/23/23 Potential to Achieve Goals: Poor Progress towards PT goals: Progressing toward goals    Frequency    Min 1X/week      PT Plan      Co-evaluation               AM-PAC PT "6 Clicks" Mobility   Outcome Measure  Help needed turning from your back to your side while in a flat bed without using bedrails?: Total Help needed moving from lying on your back to sitting on the side of a flat bed without using bedrails?: Total Help needed moving to and from a bed to a chair (including a wheelchair)?: Total Help needed standing up from a chair using your arms (e.g., wheelchair or bedside chair)?: Total Help needed to walk in hospital room?: Total Help needed climbing 3-5 steps with a railing? : Total 6 Click Score: 6    End of Session   Activity Tolerance: Patient limited by pain (pt self limiting) Patient left: in bed;with call bell/phone within reach;with bed alarm set;with family/visitor present Nurse Communication: Mobility status;Need for lift equipment PT Visit Diagnosis: History of falling (Z91.81);Muscle weakness (generalized) (M62.81)     Time: 5784-6962 PT  Time Calculation (min) (ACUTE ONLY): 37 min  Charges:    $Therapeutic Exercise: 8-22 mins $Therapeutic Activity: 8-22 mins PT General Charges $$ ACUTE PT VISIT: 1 Visit                     Wynn Maudlin, DPT Acute Rehabilitation Services Office [phone]  03/12/23 2:02 PM

## 2023-03-12 NOTE — Progress Notes (Signed)
[facility] Progress Note  4 Days Post-Op  Subjective: CC-  Ostomy productive. Tolerating full liquids. Denies n/v.  Objective: Vital signs in last 24 hours: Temp:  [98 F (36.7 C)-98.4 F (36.9 C)] 98.1 F (36.7 C) (08/31 0549) Pulse Rate:  [76-85] 85 (08/31 0549) Resp:  [16-18] 16 (08/31 0549) BP: (97-116)/(41-55) 111/43 (08/31 0549) SpO2:  [98 %-100 %] 98 % (08/31 0549) Last BM Date : 03/11/23  Intake/Output from previous day: 08/30 0701 - 08/31 0700 In: 400 [P.O.:400] Out: 1275 [Urine:1175; Stool:100] Intake/Output this shift: Total I/O In: -  Out: 150 [Stool:150]  PE: Gen:  Alert, NAD Abd: soft, ND, appropriately tender, ostomy retracted but working with dark stool in pouch, midline with staples present and trace bloody drainage at proximal aspect/ no cellulitis or purulent drainage  Lab Results:  Recent Labs    03/10/23 0458  WBC 10.4  HGB 9.2*  HCT 30.0*  PLT 262   BMET Recent Labs    03/10/23 0458 03/12/23 0633  NA 134* 133*  K 4.3 3.4*  CL 104 104  CO2 19* 21*  GLUCOSE 123* 95  BUN 18 18  CREATININE 1.13* 1.25*  CALCIUM 7.6* 7.9*   PT/INR No results for input(s): "LABPROT", "INR" in the last 72 hours. CMP     Component Value Date/Time   NA 133 (L) 03/12/2023 0633   NA 141 08/13/2022 1304   K 3.4 (L) 03/12/2023 0633   CL 104 03/12/2023 0633   CO2 21 (L) 03/12/2023 0633   GLUCOSE 95 03/12/2023 0633   BUN 18 03/12/2023 0633   BUN 19 08/13/2022 1304   CREATININE 1.25 (H) 03/12/2023 0633   CREATININE 0.68 11/13/2015 1047   CALCIUM 7.9 (L) 03/12/2023 0633   PROT 4.9 (L) 03/06/2023 0624   PROT 6.3 08/13/2022 1304   ALBUMIN 2.2 (L) 03/06/2023 0624   ALBUMIN 4.4 08/13/2022 1304   AST 12 (L) 03/06/2023 0624   ALT 9 03/06/2023 0624   ALKPHOS 52 03/06/2023 0624   BILITOT 0.5 03/06/2023 0624   BILITOT 0.5 08/13/2022 1304   GFRNONAA 46 (L) 03/12/2023 0633   GFRAA >60 12/11/2018 1222   Lipase     Component Value Date/Time    LIPASE 50 03/02/2023 1119       Studies/Results: No results found.  Anti-infectives: Anti-infectives (From admission, onward)    Start     Dose/Rate Route Frequency Ordered Stop   03/08/23 1204  sodium chloride 0.9 % with cefoTEtan (CEFOTAN) ADS Med       Note to Pharmacy: [facility] [place] L: cabinet override      03/08/23 1204 03/08/23 1247   03/07/23 0600  cefoTEtan (CEFOTAN) 2 g in sodium chloride 0.9 % 100 mL IVPB  Status:  Discontinued        2 g 200 mL/hr over 30 Minutes Intravenous On call to O.R. 03/06/23 1037 03/08/23 0559   03/06/23 1400  neomycin (MYCIFRADIN) tablet 1,000 mg       Placed in "And" Linked Group   1,000 mg Oral 3 times per day 03/06/23 1037 03/06/23 2243   03/06/23 1400  metroNIDAZOLE (FLAGYL) tablet 1,000 mg       Placed in "And" Linked Group   1,000 mg Oral 3 times per day 03/06/23 1037 03/07/23 1359   03/02/23 2000  azithromycin (ZITHROMAX) 500 mg in sodium chloride 0.9 % 250 mL IVPB  Status:  Discontinued        500 mg 250 mL/hr over 60 Minutes Intravenous  Every 24 hours 03/02/23 1959 03/04/23 1745        Assessment/Plan POD#4 s/p Sigmoid colectomy with Hartman's procedure and end colostomy, small bowel resection  8/27 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 - surgical path: Segment of colon (11.5 cm) showing diverticulosis, diverticulitis with  associated stricture and serosal adhesions, adherent benign unremarkable ovary, margins appear viable; small intestine with serosal adhesions and associated  reactive changes, margins appear viable  >> this has been discussed with the patient and her family - ostomy retracted but functioning, monitor - Advance to soft diet - mobilize, PT, IS - WOC following for new ostomy. Will need ostomy clinic referral at discharge, order has been placed   ID - cefotetan periop FEN - soft diet VTE - SCDs, lovenox Foley - Can remove from surgical standpoint when appropriate by cards and medicine   CAD - eliquis on  Hold CHF HTN HLD RA on Plaquenil and methotrexate.  Currently on hold Morbid obesity Anemia     LOS: 10 days    Franne Forts, [facility] 03/12/2023, 9:26 AM Please see Amion for pager number during day hours 7:00am-4:30pm

## 2023-03-12 NOTE — Evaluation (Addendum)
Occupational Therapy Evaluation Patient Details Name: Cindy Holmes MRN: [id number] DOB: [birth year] Today's Date: 03/12/2023   History of Present Illness Pt is 72 yo presenting with persistent diarrhea, nausea and vomiting. Ex-lap with sigmoid colectomy and small bowel resection on 8/27. PMH: HTN, RA, CAD chronic HFpEF and hx CVA. Pt was recently discharged from hospital to home for similar issue.   Clinical Impression   Pt presents with decline in function and safety with ADLs and ADL mobility with impaired strength, balance and endurance. PTA pt lived at home with her husband assistance with shower transfers and IADLs, ambulates for very short distances with RW, often utilizing wheelchair, history of multiple falls.  Pt currently required max A for rolling in bed , total A +2 to attempt sitting EOB, extensive assist with al ADLS/selfcare tasks. Pt's spouse has been feeding her although pt with AROM and strength B UEs to feed herself.  Pt with self limiting behavior and required max encouragement to attempt sitting EOB with +2 assist. OT and RN educated and husband on importance/benefits of participating with therapy and for OOB activity. RN raised HOB and  became agitated, refusing and raising her voice at therapist and RN when [facility] placed in 40-60 degrees. OT and RB explained to pt and her husband that pt needs to be able to tolerate sitting up in recliner since requesting pt be OOB to chair using mechanical lift. Pt and husband would like to return home with him being 24/7 caregiver, however pt's husband would not be able to provide pt's current level of care. Pt's  husband seemed visibly upset speaking with this therapist in hallway and stated that the pt  was stubborn and wants to do things her way and that he doesn't know how she thinks he can take care of her. OT will follow pt acutely to maximize level of function and safety      If plan is discharge home, recommend the following: A lot of  help with bathing/dressing/bathroom;Two people to help with walking and/or transfers;Assistance with cooking/housework;Assist for transportation    Functional Status Assessment  Patient has had a recent decline in their functional status and demonstrates the ability to make significant improvements in function in a reasonable and predictable amount of time.  Equipment Recommendations  Printmaker for Other Services       Precautions / Restrictions Precautions Precautions: Fall Restrictions Weight Bearing Restrictions: No      Mobility Bed Mobility Overal bed mobility: Needs Assistance Bed Mobility: Supine to Sit, Sit to Supine, Rolling Rolling: Max assist   Supine to sit: HOB elevated, Total assist, +2 for physical assistance Sit to supine: HOB elevated, +2 for physical assistance   General bed mobility comments: attempted to sit EOB and total A +2 to bring LEs  off EOB and to initiate elevating truk when pt stated, "no stop, that;s all I can do". Total A +2 posititon pt to [facility]    Transfers                   General transfer comment: pt declines to attempt      Balance       Sitting balance - Comments: pt declines to complete sup - sit       Standing balance comment: pt declines                           ADL either performed  or assessed with clinical judgement   ADL Overall ADL's : Needs assistance/impaired Eating/Feeding: Independent Eating/Feeding Details (indicate cue type and reason): pt able to feed herself but spouse has been feeding her Grooming: Therapist, nutritional;Wash/dry hands;Set up;Bed level   Upper Body Bathing: Moderate assistance;Bed level Upper Body Bathing Details (indicate cue type and reason): simulated Lower Body Bathing: Total assistance;Bed level   Upper Body Dressing : Moderate assistance;Bed level   Lower Body Dressing: Total assistance;Bed level       Toileting- Clothing Manipulation and Hygiene:  Total assistance;Bed level         General ADL Comments: pt with self limiting behavior. required max encouragement to attempt sitting EOB with +2 assist. pt became agaiated, refusing and raising her voice at therapist and RN when [facility] placed in 40-60 degrees.     Vision Ability to See in Adequate Light: 0 Adequate Patient Visual Report: No change from baseline       Perception         Praxis         Pertinent Vitals/Pain Pain Assessment Pain Assessment: Faces Faces Pain Scale: Hurts even more Pain Location: abdomen Pain Descriptors / Indicators: Sore, Discomfort, Grimacing Pain Intervention(s): Monitored during session, Limited activity within patient's tolerance, Repositioned     Extremity/Trunk Assessment Upper Extremity Assessment Upper Extremity Assessment: Generalized weakness   Lower Extremity Assessment Lower Extremity Assessment: Defer to PT evaluation       Communication Communication Communication: No apparent difficulties   Cognition Arousal: Alert Behavior During Therapy: Flat affect Overall Cognitive Status: Impaired/Different from baseline Area of Impairment: Problem solving                               General Comments: pt self limiting     General Comments       Exercises     Shoulder Instructions      Home Living Family/patient expects to be discharged to:: Private residence Living Arrangements: Spouse/significant other Available Help at Discharge: Family;Available 24 hours/day Type of Home: House Home Access: Ramped entrance     Home Layout: One level     Bathroom Shower/Tub: Chief Strategy Officer: Handicapped height     Home Equipment: Wheelchair - Forensic psychologist (2 wheels);Cane - single point;Tub bench   Additional Comments: Pt uses scooter      Prior Functioning/Environment Prior Level of Function : Needs assist             Mobility Comments: ambulates for very short distances  with RW, often utilizing wheelchair. History of many falls ADLs Comments: assistance with tub transfers and IADLs        OT Problem List: Decreased strength;Decreased activity tolerance;Impaired balance (sitting and/or standing);Decreased cognition;Decreased safety awareness;Decreased knowledge of use of DME or AE      OT Treatment/Interventions:      OT Goals(Current goals can be found in the care plan section) Acute Rehab OT Goals Patient Stated Goal: to retunr home OT Goal Formulation: With patient/family Time For Goal Achievement: 03/26/23 Potential to Achieve Goals: Fair ADL Goals Pt Will Perform Eating: with supervision;with set-up;sitting Pt Will Perform Grooming: with supervision;with set-up;sitting Pt Will Perform Upper Body Bathing: with min assist;sitting Pt Will Perform Upper Body Dressing: with min assist;sitting Pt Will Transfer to Toilet: with max assist;with mod assist;stand pivot transfer  OT Frequency:      Co-evaluation  AM-PAC OT "6 Clicks" Daily Activity     Outcome Measure Help from another person eating meals?: None Help from another person taking care of personal grooming?: A Little Help from another person toileting, which includes using toliet, bedpan, or urinal?: Total Help from another person bathing (including washing, rinsing, drying)?: Total Help from another person to put on and taking off regular upper body clothing?: A Little Help from another person to put on and taking off regular lower body clothing?: Total 6 Click Score: 13   End of Session    Activity Tolerance: Patient limited by pain Patient left: in bed;with call bell/phone within reach;with family/visitor present  OT Visit Diagnosis: Unsteadiness on feet (R26.81);Muscle weakness (generalized) (M62.81);Other symptoms and signs involving cognitive function                Time: 2376-2831 OT Time Calculation (min): 41 min Charges:  OT General Charges $OT Visit: 1  Visit OT Evaluation $OT Eval Moderate Complexity: 1 Mod OT Treatments $Self Care/Home Management : 8-22 mins $Therapeutic Activity: 8-22 mins    Galen Manila 03/12/2023, 1:26 PM

## 2023-03-12 NOTE — Progress Notes (Signed)
PROGRESS NOTE    Cindy Holmes  ZOX:[id number] DOB: 1950/08/09 DOA: 03/02/2023 PCP: [name], MD    Brief Narrative:  72 year old with hypertension, rheumatoid arthritis on immunosuppressive's, coronary artery disease, chronic diastolic heart failure, history of stroke presented to the ER with persistent diarrhea, nausea and vomiting.  Underwent colonoscopy and found to have sigmoid colon stenosis.  Surgery consulted.  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   Assessment & Plan:   Sigmoid stricture, overflow diarrhea with recent history of enteropathogenic E. coli: Colonoscopy with a strictured benign Ex lap, resection and colostomy.  Bowel function returned.  Advancing to soft diet today.  Acute kidney injury: multifactorial.  Renal function stable.  Tolerating torsemide.  Paroxysmal A-fib: Rate controlled.  On metoprolol.  Eliquis on hold.  Will start as soon as okay with surgery.  Rheumatoid arthritis: On methotrexate and Plaquenil on hold for procedure and wound healing.  Hyperlipidemia: On statin.  Acute on chronic diastolic heart failure with pulmonary hypertension, bilateral leg edema, vascular congestion: Echocardiogram with ejection fraction of 70%.  Severely elevated pulmonary artery systolic pressures.  Cardiology has seen patient preop.  Back on torsemide.  Anemia of chronic disease: Hemoglobin stable at around 8-9.  Will continue to monitor.  Hypokalemia: Replace.  Poor circulation left leg: Has Doppler pulses.  ABI pending.   DVT prophylaxis: SCD.   Code Status: Full code Family Communication: Husband at the bedside. Disposition Plan: Status is: Inpatient Remains inpatient appropriate because: Postoperative.     Consultants:  General surgery Cardiology GI  Procedures:  Colonoscopy 8/23  end colostomy 8/27  Antimicrobials:  Completed   Subjective:  Patient seen and examined.  Happy to be able to eat regular food.  Husband at the  bedside.  Husband is comfortable changing her colostomy.  Patient is very poorly motivated and does not even want to move.  Objective: Vitals:   03/11/23 1619 03/11/23 2113 03/12/23 0549 03/12/23 0926  BP: (!) 116/55 (!) 97/43 (!) 111/43 (!) 109/45  Pulse:  83 85 98  Resp:  18 16 20   Temp: 98 F (36.7 C) 98.1 F (36.7 C) 98.1 F (36.7 C) 97.9 F (36.6 C)  TempSrc: Oral  Oral Oral  SpO2: 100% 100% 98% 100%  Weight:      Height:        Intake/Output Summary (Last 24 hours) at 03/12/2023 1251 Last data filed at 03/12/2023 0936 Gross per 24 hour  Intake 540 ml  Output 1200 ml  Net -660 ml   Filed Weights   03/02/23 1115 03/08/23 1018  Weight: 104.3 kg 99.8 kg    Examination:  General: Comfortable at rest.  Chronically sick looking debilitated. On room air today. Cardiovascular: S1-S2 normal.  Irregularly irregular. Respiratory: No added sounds. Gastrointestinal: Soft.  Pendulous.  Obese.  Midline incision clean and dry.  Left lower quadrant stoma with loose brown stool. Ext: 1+ edema both legs. Normal color and temperature of both feet.    Data Reviewed: I have personally reviewed following labs and imaging studies  CBC: Recent Labs  Lab 03/06/23 0624 03/09/23 0524 03/10/23 0458  WBC 4.4 10.6* 10.4  NEUTROABS 3.3  --   --   HGB 8.9* 8.4* 9.2*  HCT 27.6* 26.4* 30.0*  MCV 93.9 92.0 96.2  PLT 241 267 262   Basic Metabolic Panel: Recent Labs  Lab 03/06/23 0624 03/07/23 0459 03/08/23 1849 03/09/23 0513 03/09/23 0524 03/10/23 0458 03/12/23 0633  NA 131* 131* 134* 132*  --  134*  133*  K 4.0 3.5 3.8 4.3  --  4.3 3.4*  CL 103 103 104 105  --  104 104  CO2 21* 19* 23 15*  --  19* 21*  GLUCOSE 131* 93 137* 133*  --  123* 95  BUN 22 21 17 17   --  18 18  CREATININE 1.41* 1.46* 1.33* 1.21*  --  1.13* 1.25*  CALCIUM 7.6* 7.7* 7.6* 7.3*  --  7.6* 7.9*  MG 2.0  --   --   --  1.9 2.1  --    GFR: Estimated Creatinine Clearance: 46.5 mL/min (A) (by C-G formula  based on SCr of 1.25 mg/dL (H)). Liver Function Tests: Recent Labs  Lab 03/06/23 0624  AST 12*  ALT 9  ALKPHOS 52  BILITOT 0.5  PROT 4.9*  ALBUMIN 2.2*   No results for input(s): "LIPASE", "AMYLASE" in the last 168 hours. No results for input(s): "AMMONIA" in the last 168 hours. Coagulation Profile: No results for input(s): "INR", "PROTIME" in the last 168 hours. Cardiac Enzymes: No results for input(s): "CKTOTAL", "CKMB", "CKMBINDEX", "TROPONINI" in the last 168 hours. BNP (last 3 results) Recent Labs    05/19/22 1204  PROBNP 380*   HbA1C: No results for input(s): "HGBA1C" in the last 72 hours.  CBG: No results for input(s): "GLUCAP" in the last 168 hours. Lipid Profile: No results for input(s): "CHOL", "HDL", "LDLCALC", "TRIG", "CHOLHDL", "LDLDIRECT" in the last 72 hours. Thyroid Function Tests: No results for input(s): "TSH", "T4TOTAL", "FREET4", "T3FREE", "THYROIDAB" in the last 72 hours. Anemia Panel: No results for input(s): "VITAMINB12", "FOLATE", "FERRITIN", "TIBC", "IRON", "RETICCTPCT" in the last 72 hours. Sepsis Labs: No results for input(s): "PROCALCITON", "LATICACIDVEN" in the last 168 hours.   Recent Results (from the past 240 hour(s))  Gastrointestinal Panel by PCR , Stool     Status: None   Collection Time: 03/02/23  4:10 PM   Specimen: Stool  Result Value Ref Range Status   Campylobacter species NOT DETECTED NOT DETECTED Final   Plesimonas shigelloides NOT DETECTED NOT DETECTED Final   Salmonella species NOT DETECTED NOT DETECTED Final   Yersinia enterocolitica NOT DETECTED NOT DETECTED Final   Vibrio species NOT DETECTED NOT DETECTED Final   Vibrio cholerae NOT DETECTED NOT DETECTED Final   Enteroaggregative E coli (EAEC) NOT DETECTED NOT DETECTED Final   Enteropathogenic E coli (EPEC) NOT DETECTED NOT DETECTED Final   Enterotoxigenic E coli (ETEC) NOT DETECTED NOT DETECTED Final   Shiga like toxin producing E coli (STEC) NOT DETECTED NOT  DETECTED Final   Shigella/Enteroinvasive E coli (EIEC) NOT DETECTED NOT DETECTED Final   Cryptosporidium NOT DETECTED NOT DETECTED Final   Cyclospora cayetanensis NOT DETECTED NOT DETECTED Final   Entamoeba histolytica NOT DETECTED NOT DETECTED Final   Giardia lamblia NOT DETECTED NOT DETECTED Final   Adenovirus F40/41 NOT DETECTED NOT DETECTED Final   Astrovirus NOT DETECTED NOT DETECTED Final   Norovirus GI/GII NOT DETECTED NOT DETECTED Final   Rotavirus A NOT DETECTED NOT DETECTED Final   Sapovirus (I, II, IV, and V) NOT DETECTED NOT DETECTED Final    Comment: Performed at [facility], [address] [place], [place] [address]  C Difficile Quick Screen w PCR reflex     Status: None   Collection Time: 03/02/23  4:10 PM   Specimen: Stool  Result Value Ref Range Status   C Diff antigen NEGATIVE NEGATIVE Final   C Diff toxin NEGATIVE NEGATIVE Final   C Diff  interpretation No C. difficile detected.  Final    Comment: Performed at [facility], [address], [place], [place] [address]  Surgical PCR screen     Status: None   Collection Time: 03/06/23 11:44 PM   Specimen: Nasal Mucosa; Nasal Swab  Result Value Ref Range Status   MRSA, PCR NEGATIVE NEGATIVE Final   Staphylococcus aureus NEGATIVE NEGATIVE Final    Comment: (NOTE) The Xpert SA Assay (FDA approved for NASAL specimens in patients 31 years of age and older), is one component of a comprehensive surveillance program. It is not intended to diagnose infection nor to guide or monitor treatment. Performed at [facility], [address], [place], [place] [address]          Radiology Studies: No results found.      Scheduled Meds:  acetaminophen  1,000 mg Oral Q6H   Chlorhexidine Gluconate Cloth  6 each Topical Daily   docusate sodium  100 mg Oral BID   enoxaparin (LOVENOX) injection  50 mg Subcutaneous Q24H   feeding supplement  237 mL Oral BID BM   metoprolol tartrate  25 mg Oral BID    potassium chloride  40 mEq Oral Daily   torsemide  20 mg Oral BID   Continuous Infusions:     LOS: 10 days    Time spent: 35 minutes     Dorcas Carrow, MD [facility]

## 2023-03-12 NOTE — Progress Notes (Signed)
Attempted to assist patient to sitting position in bed.  Patient became agitated, screaming and cussing at staff to lie her back down.  Patient and husband educated that patient needs to be sitting up preferably in the chair, but if not in the bed.  Patient continues to refuse to mobilize.  MD notified and plan of care ongoing.

## 2023-03-13 ENCOUNTER — Inpatient Hospital Stay (HOSPITAL_COMMUNITY): Payer: Medicare Other

## 2023-03-13 DIAGNOSIS — I70212 Atherosclerosis of native arteries of extremities with intermittent claudication, left leg: Secondary | ICD-10-CM | POA: Diagnosis not present

## 2023-03-13 DIAGNOSIS — N179 Acute kidney failure, unspecified: Secondary | ICD-10-CM | POA: Diagnosis not present

## 2023-03-13 LAB — CBC
HCT: 24.8 % — ABNORMAL LOW (ref 36.0–46.0)
Hemoglobin: 7.8 g/dL — ABNORMAL LOW (ref 12.0–15.0)
MCH: 29.9 pg (ref 26.0–34.0)
MCHC: 31.5 g/dL (ref 30.0–36.0)
MCV: 95 fL (ref 80.0–100.0)
Platelets: 272 10*3/uL (ref 150–400)
RBC: 2.61 MIL/uL — ABNORMAL LOW (ref 3.87–5.11)
RDW: 17.9 % — ABNORMAL HIGH (ref 11.5–15.5)
WBC: 8.2 10*3/uL (ref 4.0–10.5)
nRBC: 0.6 % — ABNORMAL HIGH (ref 0.0–0.2)

## 2023-03-13 LAB — BASIC METABOLIC PANEL
Anion gap: 7 (ref 5–15)
BUN: 17 mg/dL (ref 8–23)
CO2: 22 mmol/L (ref 22–32)
Calcium: 8 mg/dL — ABNORMAL LOW (ref 8.9–10.3)
Chloride: 105 mmol/L (ref 98–111)
Creatinine, Ser: 0.97 mg/dL (ref 0.44–1.00)
GFR, Estimated: >60 mL/min (ref >60)
Glucose, Bld: 100 mg/dL — ABNORMAL HIGH (ref 70–99)
Potassium: 3.4 mmol/L — ABNORMAL LOW (ref 3.5–5.1)
Sodium: 134 mmol/L — ABNORMAL LOW (ref 135–145)

## 2023-03-13 LAB — VAS US ABI WITH/WO TBI
Left ABI: 2.47
Right ABI: 2.47

## 2023-03-13 MED ORDER — POTASSIUM CHLORIDE CRYS ER 10 MEQ PO TBCR
40.0000 meq | EXTENDED_RELEASE_TABLET | Freq: Two times a day (BID) | ORAL | Status: DC
  Administered 2023-03-13 – 2023-03-21 (×10): 40 meq via ORAL
  Filled 2023-03-13 (×5): qty 2
  Filled 2023-03-13 (×2): qty 4
  Filled 2023-03-13: qty 2
  Filled 2023-03-13 (×2): qty 4
  Filled 2023-03-13: qty 2
  Filled 2023-03-13 (×3): qty 4
  Filled 2023-03-13 (×2): qty 2
  Filled 2023-03-13 (×2): qty 4
  Filled 2023-03-13 (×2): qty 2

## 2023-03-13 NOTE — Progress Notes (Signed)
VASCULAR LAB    ABI has been performed.  See CV proc for preliminary results.   Mairely Foxworth, RVT 03/13/2023, 1:08 PM

## 2023-03-13 NOTE — Progress Notes (Signed)
PROGRESS NOTE    Cindy Holmes  DGL:[id number] DOB: 1950-11-22 DOA: 03/02/2023 PCP: [name], MD    Brief Narrative:  72 year old with hypertension, rheumatoid arthritis on immunosuppressive's, coronary artery disease, chronic diastolic heart failure, history of stroke presented to the ER with persistent diarrhea, nausea and vomiting.  Underwent colonoscopy and found to have sigmoid colon stenosis.  Surgery consulted.  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 Remains in the hospital, recovering from surgery.  Difficult mobility and poor participation.  Assessment & Plan:   Sigmoid stricture, overflow diarrhea with recent history of enteropathogenic E. coli: Colonoscopy with a strictured benign Ex lap, resection and colostomy.  Bowel function returned.  Advancing to soft diet today. Pain management with oral pain medications.  Acute kidney injury: multifactorial.  Renal function stable.  Tolerating torsemide.  Paroxysmal A-fib: Rate controlled.  On metoprolol.  Eliquis on hold.  Will start as soon as okay with surgery. Hemoglobin 7.8 with recent known baseline of 9-10.  Likely expected from acute illness and surgery.  Holding off on Eliquis.  Rheumatoid arthritis: On methotrexate and Plaquenil on hold for procedure and wound healing.  Hyperlipidemia: On statin.  Acute on chronic diastolic heart failure with pulmonary hypertension, bilateral leg edema, vascular congestion: Echocardiogram with ejection fraction of 70%.  Severely elevated pulmonary artery systolic pressures.  Cardiology has seen patient preop.  Back on torsemide.  Will add scheduled potassium.  Anemia of chronic disease: Hemoglobin stable at around 8-9.  Will continue to monitor.  Hypokalemia: Replaced.  Replace further.  Poor circulation left leg: Has Doppler pulses.  ABI pending.   DVT prophylaxis: SCD.  Lovenox subcu.   Code Status: Full code Family Communication: Husband and daughter at  bedside. Disposition Plan: Status is: Inpatient Remains inpatient appropriate because: Postoperative.     Consultants:  General surgery Cardiology GI  Procedures:  Colonoscopy 8/23  end colostomy 8/27  Antimicrobials:  Completed   Subjective:  Patient seen and examined.  She was very unhappy with people asking her to mobilize out of bed.  She thinks she is not ready today.  She tells me that she needs her time and will be ready by tomorrow to be yanked out of the bed.  She is even not ready to get out of bed to the chair.  Daughter and husband at the bedside.  Patient tells me that with any mobility she gets pain in her abdomen and gaseous distention and she does not want to feel that discomfort.  I encouraged patient to ask for pain medication and nausea medication before working and mobilizing but encouraged her to continue to move as much as possible.  She declined even to remove Foley catheter.  Objective: Vitals:   03/12/23 2131 03/13/23 0451 03/13/23 0500 03/13/23 0858  BP: (!) 99/45 (!) 109/51  (!) 104/52  Pulse: 81 90  (!) 103  Resp: 20 19    Temp: 97.7 F (36.5 C) 98.3 F (36.8 C)  98.4 F (36.9 C)  TempSrc: Oral Oral    SpO2: 99% 100%  97%  Weight:   101 kg   Height:        Intake/Output Summary (Last 24 hours) at 03/13/2023 1134 Last data filed at 03/13/2023 1024 Gross per 24 hour  Intake 420 ml  Output 1200 ml  Net -780 ml   Filed Weights   03/02/23 1115 03/08/23 1018 03/13/23 0500  Weight: 104.3 kg 99.8 kg 101 kg    Examination:  General: Comfortable at rest.  Chronically sick looking debilitated. Upset with the circumstances. On room air today. Cardiovascular: S1-S2 normal.  Irregularly irregular. Respiratory: No added sounds. Gastrointestinal: Soft.  Pendulous.  Obese.  Midline incision clean and dry.  Left lower quadrant stoma with loose brown stool. Ext: 1+ edema both legs. Normal color and temperature of both feet.    Data Reviewed: I  have personally reviewed following labs and imaging studies  CBC: Recent Labs  Lab 03/09/23 0524 03/10/23 0458 03/13/23 0447  WBC 10.6* 10.4 8.2  HGB 8.4* 9.2* 7.8*  HCT 26.4* 30.0* 24.8*  MCV 92.0 96.2 95.0  PLT 267 262 272   Basic Metabolic Panel: Recent Labs  Lab 03/08/23 1849 03/09/23 0513 03/09/23 0524 03/10/23 0458 03/12/23 0633 03/13/23 0447  NA 134* 132*  --  134* 133* 134*  K 3.8 4.3  --  4.3 3.4* 3.4*  CL 104 105  --  104 104 105  CO2 23 15*  --  19* 21* 22  GLUCOSE 137* 133*  --  123* 95 100*  BUN 17 17  --  18 18 17   CREATININE 1.33* 1.21*  --  1.13* 1.25* 0.97  CALCIUM 7.6* 7.3*  --  7.6* 7.9* 8.0*  MG  --   --  1.9 2.1  --   --    GFR: Estimated Creatinine Clearance: 60.3 mL/min (by C-G formula based on SCr of 0.97 mg/dL). Liver Function Tests: No results for input(s): "AST", "ALT", "ALKPHOS", "BILITOT", "PROT", "ALBUMIN" in the last 168 hours.  No results for input(s): "LIPASE", "AMYLASE" in the last 168 hours. No results for input(s): "AMMONIA" in the last 168 hours. Coagulation Profile: No results for input(s): "INR", "PROTIME" in the last 168 hours. Cardiac Enzymes: No results for input(s): "CKTOTAL", "CKMB", "CKMBINDEX", "TROPONINI" in the last 168 hours. BNP (last 3 results) Recent Labs    05/19/22 1204  PROBNP 380*   HbA1C: No results for input(s): "HGBA1C" in the last 72 hours.  CBG: No results for input(s): "GLUCAP" in the last 168 hours. Lipid Profile: No results for input(s): "CHOL", "HDL", "LDLCALC", "TRIG", "CHOLHDL", "LDLDIRECT" in the last 72 hours. Thyroid Function Tests: No results for input(s): "TSH", "T4TOTAL", "FREET4", "T3FREE", "THYROIDAB" in the last 72 hours. Anemia Panel: No results for input(s): "VITAMINB12", "FOLATE", "FERRITIN", "TIBC", "IRON", "RETICCTPCT" in the last 72 hours. Sepsis Labs: No results for input(s): "PROCALCITON", "LATICACIDVEN" in the last 168 hours.   Recent Results (from the past 240  hour(s))  Surgical PCR screen     Status: None   Collection Time: 03/06/23 11:44 PM   Specimen: Nasal Mucosa; Nasal Swab  Result Value Ref Range Status   MRSA, PCR NEGATIVE NEGATIVE Final   Staphylococcus aureus NEGATIVE NEGATIVE Final    Comment: (NOTE) The Xpert SA Assay (FDA approved for NASAL specimens in patients 72 years of age and older), is one component of a comprehensive surveillance program. It is not intended to diagnose infection nor to guide or monitor treatment. Performed at [facility], [address] [place], [place] [address]          Radiology Studies: No results found.      Scheduled Meds:  acetaminophen  1,000 mg Oral Q6H   Chlorhexidine Gluconate Cloth  6 each Topical Daily   docusate sodium  100 mg Oral BID   enoxaparin (LOVENOX) injection  50 mg Subcutaneous Q24H   feeding supplement  237 mL Oral BID BM   metoprolol tartrate  25 mg Oral BID  potassium chloride  40 mEq Oral BID   torsemide  20 mg Oral BID   Continuous Infusions:     LOS: 11 days    Time spent: 35 minutes     Dorcas Carrow, MD [facility]

## 2023-03-13 NOTE — Progress Notes (Signed)
[facility] Progress Note  5 Days Post-Op  Subjective: CC-  Some pain and nausea. No emesis. Drinking milk but not eating. Ostomy productive. Worked with PT yesterday, required 2+ assist and max encouragement to attempt sitting EOB.  Objective: Vital signs in last 24 hours: Temp:  [97.7 F (36.5 C)-98.4 F (36.9 C)] 98.4 F (36.9 C) (09/01 0858) Pulse Rate:  [75-103] 103 (09/01 0858) Resp:  [17-20] 19 (09/01 0451) BP: (99-110)/(45-52) 104/52 (09/01 0858) SpO2:  [97 %-100 %] 97 % (09/01 0858) Weight:  [101 kg] 101 kg (09/01 0500) Last BM Date : 03/12/23  Intake/Output from previous day: 08/31 0701 - 09/01 0700 In: 540 [P.O.:540] Out: 1525 [Urine:1250; Stool:275] Intake/Output this shift: No intake/output data recorded.  PE: Gen:  Alert, NAD Abd: soft, ND, nontender, ostomy retracted but working with thin dark brown stool in pouch, midline with staples present and trace bloody drainage at proximal aspect/ no cellulitis or purulent drainage  Lab Results:  Recent Labs    03/13/23 0447  WBC 8.2  HGB 7.8*  HCT 24.8*  PLT 272   BMET Recent Labs    03/12/23 0633 03/13/23 0447  NA 133* 134*  K 3.4* 3.4*  CL 104 105  CO2 21* 22  GLUCOSE 95 100*  BUN 18 17  CREATININE 1.25* 0.97  CALCIUM 7.9* 8.0*   PT/INR No results for input(s): "LABPROT", "INR" in the last 72 hours. CMP     Component Value Date/Time   NA 134 (L) 03/13/2023 0447   NA 141 08/13/2022 1304   K 3.4 (L) 03/13/2023 0447   CL 105 03/13/2023 0447   CO2 22 03/13/2023 0447   GLUCOSE 100 (H) 03/13/2023 0447   BUN 17 03/13/2023 0447   BUN 19 08/13/2022 1304   CREATININE 0.97 03/13/2023 0447   CREATININE 0.68 11/13/2015 1047   CALCIUM 8.0 (L) 03/13/2023 0447   PROT 4.9 (L) 03/06/2023 0624   PROT 6.3 08/13/2022 1304   ALBUMIN 2.2 (L) 03/06/2023 0624   ALBUMIN 4.4 08/13/2022 1304   AST 12 (L) 03/06/2023 0624   ALT 9 03/06/2023 0624   ALKPHOS 52 03/06/2023 0624   BILITOT 0.5  03/06/2023 0624   BILITOT 0.5 08/13/2022 1304   GFRNONAA >60 03/13/2023 0447   GFRAA >60 12/11/2018 1222   Lipase     Component Value Date/Time   LIPASE 50 03/02/2023 1119       Studies/Results: No results found.  Anti-infectives: Anti-infectives (From admission, onward)    Start     Dose/Rate Route Frequency Ordered Stop   03/08/23 1204  sodium chloride 0.9 % with cefoTEtan (CEFOTAN) ADS Med       Note to Pharmacy: Susy Manor L: cabinet override      03/08/23 1204 03/08/23 1247   03/07/23 0600  cefoTEtan (CEFOTAN) 2 g in sodium chloride 0.9 % 100 mL IVPB  Status:  Discontinued        2 g 200 mL/hr over 30 Minutes Intravenous On call to O.R. 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 - surgical path: Segment of colon (11.5 cm) showing diverticulosis, diverticulitis with  associated stricture and serosal adhesions, adherent benign unremarkable ovary, margins appear viable; small intestine with serosal adhesions and associated  reactive changes, margins appear viable  >> this has been discussed with the patient and her family - ostomy retracted but functioning, monitor - Continue soft diet, protein shakes, encouraged family to bring food in that the patient would eat - mobilize, PT/OT - recommending SNF but patient likely to  refuse and wants to go home - WOC following for new ostomy. Will need ostomy clinic referral at discharge, order has been placed   ID - cefotetan periop FEN - reg diet VTE - SCDs, lovenox (resume eliquis when h/h stable) Foley - Can remove from surgical standpoint when appropriate by cards and medicine   CAD - eliquis on Hold CHF HTN HLD RA on Plaquenil and methotrexate.  Currently on hold Morbid obesity Anemia     LOS: 11 days    Franne Forts, [facility] 03/13/2023, 9:20 AM Please see Amion for pager number during day hours 7:00am-4:30pm

## 2023-03-14 DIAGNOSIS — N179 Acute kidney failure, unspecified: Secondary | ICD-10-CM | POA: Diagnosis not present

## 2023-03-14 LAB — CBC
HCT: 25.4 % — ABNORMAL LOW (ref 36.0–46.0)
Hemoglobin: 8.2 g/dL — ABNORMAL LOW (ref 12.0–15.0)
MCH: 30.4 pg (ref 26.0–34.0)
MCHC: 32.3 g/dL (ref 30.0–36.0)
MCV: 94.1 fL (ref 80.0–100.0)
Platelets: 292 10*3/uL (ref 150–400)
RBC: 2.7 MIL/uL — ABNORMAL LOW (ref 3.87–5.11)
RDW: 18.1 % — ABNORMAL HIGH (ref 11.5–15.5)
WBC: 8.5 10*3/uL (ref 4.0–10.5)
nRBC: 0.6 % — ABNORMAL HIGH (ref 0.0–0.2)

## 2023-03-14 MED ORDER — ZOLPIDEM TARTRATE 5 MG PO TABS
5.0000 mg | ORAL_TABLET | Freq: Every evening | ORAL | Status: DC | PRN
  Administered 2023-03-19 – 2023-03-20 (×2): 5 mg via ORAL
  Filled 2023-03-14 (×3): qty 1

## 2023-03-14 MED ORDER — APIXABAN 5 MG PO TABS
5.0000 mg | ORAL_TABLET | Freq: Two times a day (BID) | ORAL | Status: DC
  Administered 2023-03-15 – 2023-03-21 (×13): 5 mg via ORAL
  Filled 2023-03-14 (×14): qty 1

## 2023-03-14 NOTE — Progress Notes (Signed)
[facility] Progress Note  6 Days Post-Op  Subjective: CC-  Husband at bedside.  Still not much of an appetite, but she did tolerate two protein shakes. Colostomy functioning. Denies n/v.  Waiting to work with PT. Now more open to considering SNF.  Objective: Vital signs in last 24 hours: Temp:  [98 F (36.7 C)-98.4 F (36.9 C)] 98.4 F (36.9 C) (09/02 0750) Pulse Rate:  [78-89] 78 (09/02 0750) Resp:  [16-20] 16 (09/02 0750) BP: (109-118)/(37-54) 118/44 (09/02 0750) SpO2:  [94 %-96 %] 96 % (09/02 0750) Last BM Date : 03/13/23  Intake/Output from previous day: 09/01 0701 - 09/02 0700 In: 480 [P.O.:480] Out: 2325 [Urine:2125; Stool:200] Intake/Output this shift: Total I/O In: 120 [P.O.:120] Out: -   PE: Gen:  Alert, NAD Abd: soft, ND, nontender, ostomy retracted but working with thin dark brown stool in pouch, midline with staples present and no erythema or drainage  Lab Results:  Recent Labs    03/13/23 0447 03/14/23 0456  WBC 8.2 8.5  HGB 7.8* 8.2*  HCT 24.8* 25.4*  PLT 272 292   BMET Recent Labs    03/12/23 0633 03/13/23 0447  NA 133* 134*  K 3.4* 3.4*  CL 104 105  CO2 21* 22  GLUCOSE 95 100*  BUN 18 17  CREATININE 1.25* 0.97  CALCIUM 7.9* 8.0*   PT/INR No results for input(s): "LABPROT", "INR" in the last 72 hours. CMP     Component Value Date/Time   NA 134 (L) 03/13/2023 0447   NA 141 08/13/2022 1304   K 3.4 (L) 03/13/2023 0447   CL 105 03/13/2023 0447   CO2 22 03/13/2023 0447   GLUCOSE 100 (H) 03/13/2023 0447   BUN 17 03/13/2023 0447   BUN 19 08/13/2022 1304   CREATININE 0.97 03/13/2023 0447   CREATININE 0.68 11/13/2015 1047   CALCIUM 8.0 (L) 03/13/2023 0447   PROT 4.9 (L) 03/06/2023 0624   PROT 6.3 08/13/2022 1304   ALBUMIN 2.2 (L) 03/06/2023 0624   ALBUMIN 4.4 08/13/2022 1304   AST 12 (L) 03/06/2023 0624   ALT 9 03/06/2023 0624   ALKPHOS 52 03/06/2023 0624   BILITOT 0.5 03/06/2023 0624   BILITOT 0.5 08/13/2022 1304    GFRNONAA >60 03/13/2023 0447   GFRAA >60 12/11/2018 1222   Lipase     Component Value Date/Time   LIPASE 50 03/02/2023 1119       Studies/Results: VAS [place] ABI WITH/WO TBI  Result Date: 03/13/2023  LOWER EXTREMITY DOPPLER STUDY Patient Name:  Cindy Holmes  Date of Exam:   03/13/2023 Medical Rec #: [id number]        Accession #:    [id number] Date of Birth: Feb 21, 1951       Patient Gender: F Patient Age:   72 years Exam Location:  [facility] Procedure:      VAS [place] ABI WITH/WO TBI Referring Phys: Dorcas Carrow --------------------------------------------------------------------------------  Indications: Cool left foot with Dopplerable pulses per ordering MD High Risk Factors: Hypertension, hyperlipidemia, coronary artery disease. 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 Paroxysmal atrial fibrillation.  Limitations: Today's exam was limited due to Edema, irregular heart beat, pain              with cuff compression, patient refused to have blood pressure taken  in upper arm and involuntary patient movement. Comparison Study: No prior study on file Performing Technologist: Sherren Kerns RVS  Examination Guidelines: A complete evaluation includes at minimum, Doppler waveform signals and systolic blood pressure reading at the level of bilateral brachial, anterior tibial, and posterior tibial arteries, when vessel segments are accessible. Bilateral testing is considered an integral part of a complete examination. Photoelectric Plethysmograph (PPG) waveforms and toe systolic pressure readings are included as required and additional duplex testing as needed. Limited examinations for reoccurring indications may be performed as noted.  ABI Findings: +---------+------------------+-----+--------+---------+ Right    Rt Pressure (mmHg)IndexWaveformComment   +---------+------------------+-----+--------+---------+  Brachial                                Not taken +---------+------------------+-----+--------+---------+ PTA      254               2.47 biphasic          +---------+------------------+-----+--------+---------+ DP       142               1.38 biphasic          +---------+------------------+-----+--------+---------+ Great Toe81                0.79                   +---------+------------------+-----+--------+---------+ +---------+------------------+-----+---------+---------------+ Left     Lt Pressure (mmHg)IndexWaveform Comment         +---------+------------------+-----+---------+---------------+ Brachial 103                    triphasicRADIAL PRESSURE +---------+------------------+-----+---------+---------------+ PTA      169               1.64 biphasic                 +---------+------------------+-----+---------+---------------+ DP       254               2.47 biphasic                 +---------+------------------+-----+---------+---------------+ Great Toe62                0.60                          +---------+------------------+-----+---------+---------------+ +-------+---------------------+-----------+------------+------------+ ABI/TBIToday's ABI          Today's TBIPrevious ABIPrevious TBI +-------+---------------------+-----------+------------+------------+ Right  2.47 non compressible0.79                                +-------+---------------------+-----------+------------+------------+ Left   2.47 non compressible0.60                                +-------+---------------------+-----------+------------+------------+ Arterial wall calcification precludes accurate ankle pressures and ABIs.  Summary: Right: Resting right ankle-brachial index indicates noncompressible right lower extremity arteries. The right toe-brachial index is normal. Left: Resting left ankle-brachial index indicates noncompressible left lower extremity  arteries. The left toe-brachial index is reduced. *See table(s) above for measurements and observations.  Electronically signed by Lemar Livings MD on 03/13/2023 at 4:08:01 PM.    Final     Anti-infectives: Anti-infectives (From admission, onward)    Start     Dose/Rate Route Frequency Ordered Stop  03/08/23 1204  sodium chloride 0.9 % with cefoTEtan (CEFOTAN) ADS Med       Note to Pharmacy: Susy Manor L: cabinet override      03/08/23 1204 03/08/23 1247   03/07/23 0600  cefoTEtan (CEFOTAN) 2 g in sodium chloride 0.9 % 100 mL IVPB  Status:  Discontinued        2 g 200 mL/hr over 30 Minutes Intravenous On call to O.R. 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 Encourage PO intake - mobilize, PT/OT - recommending SNF, pt previously refusing but now may be  willing to consider - WOC following for new ostomy. Will need ostomy clinic referral at discharge, order has been placed   ID - cefotetan periop FEN - reg diet VTE - SCDs, lovenox. Ok to resume eliquis Foley - Can remove from surgical standpoint when appropriate by cards and medicine   CAD - eliquis on Hold CHF HTN HLD RA on Plaquenil and methotrexate.  Currently on hold Morbid obesity Anemia     LOS: 12 days    Franne Forts, [facility] 03/14/2023, 10:12 AM Please see Amion for pager number during day hours 7:00am-4:30pm

## 2023-03-14 NOTE — Progress Notes (Signed)
PROGRESS NOTE    Cindy Holmes  WUX:[id number] DOB: 06/08/1951 DOA: 03/02/2023 PCP: [name], MD    Brief Narrative:  72 year old with hypertension, rheumatoid arthritis on immunosuppressive's, coronary artery disease, chronic diastolic heart failure, history of stroke presented to the ER with persistent diarrhea, nausea and vomiting.  Underwent colonoscopy and found to have sigmoid colon stenosis.  Surgery consulted.  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 Remains in the hospital, recovering from surgery.  Difficult mobility and poor participation.  Assessment & Plan:   Sigmoid stricture, overflow diarrhea with recent history of enteropathogenic E. coli: Colonoscopy with a strictured benign Ex lap, resection and colostomy.  Bowel function returned.  Advancing to soft diet today. Pain management with oral pain medications.  Acute kidney injury: multifactorial.  Renal function stable.  Tolerating torsemide.  Paroxysmal A-fib: Rate controlled.  On metoprolol.  Surgery agreed to start Eliquis.  Hemoglobin is around 8.  recent known baseline of 9-10.  Likely expected from acute illness and surgery.    Rheumatoid arthritis: On methotrexate and Plaquenil on hold for procedure and wound healing.  Will resume on discharge.  Hyperlipidemia: On statin.  Acute on chronic diastolic heart failure with pulmonary hypertension, bilateral leg edema, vascular congestion: Echocardiogram with ejection fraction of 70%.  Severely elevated pulmonary artery systolic pressures.  Cardiology has seen patient preop.  Back on torsemide.  Scheduled potassium.  Anemia of chronic disease: Hemoglobin stable at around 8-9.  Will continue to monitor.  Hypokalemia: Replaced.  On scheduled replacement.  Poor circulation left leg: Has Doppler pulses.  Patient does have compromised ABI on the left foot.  No evidence of vascular compromise.  Currently does not need intervention.  Continue to work with PT  OT.  Discontinue Foley catheter.  Poorly motivated to mobilize. If she is not able to have good mobility, needs to go to SNF.   DVT prophylaxis:  apixaban (ELIQUIS) tablet 5 mg   Code Status: Full code Family Communication: Husband at the bedside. Disposition Plan: Status is: Inpatient Remains inpatient appropriate because: Postoperative.  Recovering.  May need SNF.     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  Denies any complaints.  Apprehensive about getting up.  She thinks she will only sit up to the edge of the bed today.  Pain is controlled at rest, she is apprehensive about getting more gaseous distention and discomfort with mobility.  Objective: Vitals:   03/13/23 1713 03/13/23 2053 03/14/23 0516 03/14/23 0750  BP: (!) 118/44 (!) 115/54 (!) 109/37 (!) 118/44  Pulse: 79 89 88 78  Resp:  17 20 16   Temp: 98.1 F (36.7 C) 98 F (36.7 C) 98.1 F (36.7 C) 98.4 F (36.9 C)  TempSrc:  Oral Oral   SpO2: 94% 95% 96% 96%  Weight:      Height:        Intake/Output Summary (Last 24 hours) at 03/14/2023 1308 Last data filed at 03/14/2023 0817 Gross per 24 hour  Intake 360 ml  Output 2300 ml  Net -1940 ml   Filed Weights   03/02/23 1115 03/08/23 1018 03/13/23 0500  Weight: 104.3 kg 99.8 kg 101 kg    Examination:  General: Comfortable at rest.  Chronically sick looking debilitated.  Hard of hearing. On room air. Cardiovascular: S1-S2 normal.  Irregularly irregular. Respiratory: No added sounds. Gastrointestinal: Soft.  Pendulous.  Obese.  Midline  incision clean and dry.  Left lower quadrant stoma with loose brown stool. Ext: 1+ edema both legs. Normal color and temperature of both feet.    Data Reviewed: I have personally reviewed following labs and imaging studies  CBC: Recent Labs  Lab 03/09/23 0524 03/10/23 0458 03/13/23 0447  03/14/23 0456  WBC 10.6* 10.4 8.2 8.5  HGB 8.4* 9.2* 7.8* 8.2*  HCT 26.4* 30.0* 24.8* 25.4*  MCV 92.0 96.2 95.0 94.1  PLT 267 262 272 292   Basic Metabolic Panel: Recent Labs  Lab 03/08/23 1849 03/09/23 0513 03/09/23 0524 03/10/23 0458 03/12/23 0633 03/13/23 0447  NA 134* 132*  --  134* 133* 134*  K 3.8 4.3  --  4.3 3.4* 3.4*  CL 104 105  --  104 104 105  CO2 23 15*  --  19* 21* 22  GLUCOSE 137* 133*  --  123* 95 100*  BUN 17 17  --  18 18 17   CREATININE 1.33* 1.21*  --  1.13* 1.25* 0.97  CALCIUM 7.6* 7.3*  --  7.6* 7.9* 8.0*  MG  --   --  1.9 2.1  --   --    GFR: Estimated Creatinine Clearance: 60.3 mL/min (by C-G formula based on SCr of 0.97 mg/dL). Liver Function Tests: No results for input(s): "AST", "ALT", "ALKPHOS", "BILITOT", "PROT", "ALBUMIN" in the last 168 hours.  No results for input(s): "LIPASE", "AMYLASE" in the last 168 hours. No results for input(s): "AMMONIA" in the last 168 hours. Coagulation Profile: No results for input(s): "INR", "PROTIME" in the last 168 hours. Cardiac Enzymes: No results for input(s): "CKTOTAL", "CKMB", "CKMBINDEX", "TROPONINI" in the last 168 hours. BNP (last 3 results) Recent Labs    05/19/22 1204  PROBNP 380*   HbA1C: No results for input(s): "HGBA1C" in the last 72 hours.  CBG: No results for input(s): "GLUCAP" in the last 168 hours. Lipid Profile: No results for input(s): "CHOL", "HDL", "LDLCALC", "TRIG", "CHOLHDL", "LDLDIRECT" in the last 72 hours. Thyroid Function Tests: No results for input(s): "TSH", "T4TOTAL", "FREET4", "T3FREE", "THYROIDAB" in the last 72 hours. Anemia Panel: No results for input(s): "VITAMINB12", "FOLATE", "FERRITIN", "TIBC", "IRON", "RETICCTPCT" in the last 72 hours. Sepsis Labs: No results for input(s): "PROCALCITON", "LATICACIDVEN" in the last 168 hours.   Recent Results (from the past 240 hour(s))  Surgical PCR screen     Status: None   Collection Time: 03/06/23 11:44 PM   Specimen:  Nasal Mucosa; Nasal Swab  Result Value Ref Range Status   MRSA, PCR NEGATIVE NEGATIVE Final   Staphylococcus aureus NEGATIVE NEGATIVE Final    Comment: (NOTE) The Xpert SA Assay (FDA approved for NASAL specimens in patients 63 years of age and older), is one component of a comprehensive surveillance program. It is not intended to diagnose infection nor to guide or monitor treatment. Performed at [facility], [address], [place], [place] [address]          Radiology Studies: VAS [place] ABI WITH/WO TBI  Result Date: 03/13/2023  LOWER EXTREMITY DOPPLER STUDY Patient Name:  Cindy Holmes  Date of Exam:   03/13/2023 Medical Rec #: [id number]        Accession #:    [id number] Date of Birth: 05/29/1951       Patient Gender: F Patient Age:   16 years Exam Location:  [facility] Procedure:      VAS [place] ABI WITH/WO TBI Referring Phys: Dorcas Carrow --------------------------------------------------------------------------------  Indications: Cool left foot  with Dopplerable pulses per ordering MD High Risk Factors: Hypertension, hyperlipidemia, coronary artery disease. 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 Paroxysmal atrial fibrillation.  Limitations: Today's exam was limited due to Edema, irregular heart beat, pain              with cuff compression, patient refused to have blood pressure taken              in upper arm and involuntary patient movement. Comparison Study: No prior study on file Performing Technologist: Sherren Kerns RVS  Examination Guidelines: A complete evaluation includes at minimum, Doppler waveform signals and systolic blood pressure reading at the level of bilateral brachial, anterior tibial, and posterior tibial arteries, when vessel segments are accessible. Bilateral testing is considered an integral part of a complete examination. Photoelectric Plethysmograph (PPG) waveforms and toe  systolic pressure readings are included as required and additional duplex testing as needed. Limited examinations for reoccurring indications may be performed as noted.  ABI Findings: +---------+------------------+-----+--------+---------+ Right    Rt Pressure (mmHg)IndexWaveformComment   +---------+------------------+-----+--------+---------+ Brachial                                Not taken +---------+------------------+-----+--------+---------+ PTA      254               2.47 biphasic          +---------+------------------+-----+--------+---------+ DP       142               1.38 biphasic          +---------+------------------+-----+--------+---------+ Great Toe81                0.79                   +---------+------------------+-----+--------+---------+ +---------+------------------+-----+---------+---------------+ Left     Lt Pressure (mmHg)IndexWaveform Comment         +---------+------------------+-----+---------+---------------+ Brachial 103                    triphasicRADIAL PRESSURE +---------+------------------+-----+---------+---------------+ PTA      169               1.64 biphasic                 +---------+------------------+-----+---------+---------------+ DP       254               2.47 biphasic                 +---------+------------------+-----+---------+---------------+ Great Toe62                0.60                          +---------+------------------+-----+---------+---------------+ +-------+---------------------+-----------+------------+------------+ ABI/TBIToday's ABI          Today's TBIPrevious ABIPrevious TBI +-------+---------------------+-----------+------------+------------+ Right  2.47 non compressible0.79                                +-------+---------------------+-----------+------------+------------+ Left   2.47 non compressible0.60                                 +-------+---------------------+-----------+------------+------------+  Arterial wall calcification precludes accurate ankle pressures and ABIs.  Summary: Right: Resting right ankle-brachial index indicates noncompressible right lower extremity arteries. The right toe-brachial index is normal. Left: Resting left ankle-brachial index indicates noncompressible left lower extremity arteries. The left toe-brachial index is reduced. *See table(s) above for measurements and observations.  Electronically signed by Lemar Livings MD on 03/13/2023 at 4:08:01 PM.    Final         Scheduled Meds:  acetaminophen  1,000 mg Oral Q6H   apixaban  5 mg Oral BID   Chlorhexidine Gluconate Cloth  6 each Topical Daily   docusate sodium  100 mg Oral BID   feeding supplement  237 mL Oral BID BM   metoprolol tartrate  25 mg Oral BID   potassium chloride  40 mEq Oral BID   torsemide  20 mg Oral BID   Continuous Infusions:     LOS: 12 days    Time spent: 35 minutes     Dorcas Carrow, MD [facility]

## 2023-03-14 NOTE — Consult Note (Signed)
WOC Nurse ostomy follow up Stoma type/location: LMQ colostomy  Stomal assessment/size: 1 1/4" x 1 3/8" oval, pink moist flush with skin  Peristomal assessment: mild erythema  Treatment options for stomal/peristomal skin: no sting barrier wipe used for skin around stoma, 2" barrier ring  Output approximately 50 mls soft brown stool  Ostomy pouching: 1pc. Convex Hart [place] #[id number] 2" barrier ring Hart [place] [phone]  Education provided: Husband performed ostomy pouch change with minimal verbal cueing today.  Reiterated emptying when 1/3 to 1/2 full.  Husband able to demonstrate opening lock and roll mechanism, cleaning spout with toilet paper wick and closing lock and roll.  Husband removed old pouch and cleaned around stoma with water moistened washcloth.  Husband able to cut new skin barrier at 1 1/4" then I widened out to 1 3/8" on sides, discussed and demonstrated to him widening out as stoma is oval.  Husband stretched 2" barrier ring and placed around stoma then attached new 1 piece pouch on top of barrier ring. Husband ran finger around inner seal to meld to skin.  Husband closed new pouch.   Determined at this visit that husband had taken previous supplies home including 2 piece convex system.  Will order 1 piece convex pouching system today.  There is one left in room for bedside staff to use at this time.    Ordered 6 sets of Hart [place] #[id number] and Hart [place] #[id number].    Husband also expressed interest in home health at discharge to assist with ostomy.  Sent secure chat to [facility] regarding this as patient and spouse had previously deferred home health.   WOC team will continue to follow for ostomy care as long as inpatient.   Enrolled patient in [facility] Discharge program: Yes  Thank you,    Priscella Mann MSN, RN-BC, [employer] [phone]

## 2023-03-15 DIAGNOSIS — N179 Acute kidney failure, unspecified: Secondary | ICD-10-CM | POA: Diagnosis not present

## 2023-03-15 LAB — BASIC METABOLIC PANEL
Anion gap: 13 (ref 5–15)
BUN: 11 mg/dL (ref 8–23)
CO2: 23 mmol/L (ref 22–32)
Calcium: 8.3 mg/dL — ABNORMAL LOW (ref 8.9–10.3)
Chloride: 98 mmol/L (ref 98–111)
Creatinine, Ser: 0.83 mg/dL (ref 0.44–1.00)
GFR, Estimated: >60 mL/min (ref >60)
Glucose, Bld: 118 mg/dL — ABNORMAL HIGH (ref 70–99)
Potassium: 3.4 mmol/L — ABNORMAL LOW (ref 3.5–5.1)
Sodium: 134 mmol/L — ABNORMAL LOW (ref 135–145)

## 2023-03-15 MED ORDER — ONDANSETRON 4 MG PO TBDP
4.0000 mg | ORAL_TABLET | Freq: Three times a day (TID) | ORAL | Status: DC | PRN
  Administered 2023-03-15: 4 mg via ORAL
  Filled 2023-03-15: qty 1

## 2023-03-15 MED ORDER — POTASSIUM CHLORIDE CRYS ER 20 MEQ PO TBCR: 40.0000 meq | EXTENDED_RELEASE_TABLET | Freq: Once | ORAL | Status: DC

## 2023-03-15 NOTE — Progress Notes (Signed)
[facility] Progress Note  7 Days Post-Op  Subjective: Husband at bedside.  Ate well this morning.  No nausea.  Wanting to go home and not to rehab.  Concerned about getting to the bathroom today once foley removed.  Objective: Vital signs in last 24 hours: Temp:  [97.6 F (36.4 C)-98.5 F (36.9 C)] 98.4 F (36.9 C) (09/03 0746) Pulse Rate:  [88-103] 103 (09/03 0746) Resp:  [16-18] 16 (09/03 0746) BP: (94-117)/(41-70) 112/70 (09/03 0746) SpO2:  [93 %-100 %] 95 % (09/03 0746) Last BM Date : 03/13/23  Intake/Output from previous day: 09/02 0701 - 09/03 0700 In: 360 [P.O.:360] Out: 1275 [Urine:1275] Intake/Output this shift: No intake/output data recorded.  PE: Gen:  Alert, NAD Abd: soft, ND, nontender, ostomy retracted but working with thin dark brown stool in pouch, midline with staples present and no erythema or drainage  Lab Results:  Recent Labs    03/13/23 0447 03/14/23 0456  WBC 8.2 8.5  HGB 7.8* 8.2*  HCT 24.8* 25.4*  PLT 272 292   BMET Recent Labs    03/13/23 0447 03/15/23 0606  NA 134* 134*  K 3.4* 3.4*  CL 105 98  CO2 22 23  GLUCOSE 100* 118*  BUN 17 11  CREATININE 0.97 0.83  CALCIUM 8.0* 8.3*   PT/INR No results for input(s): "LABPROT", "INR" in the last 72 hours. CMP     Component Value Date/Time   NA 134 (L) 03/15/2023 0606   NA 141 08/13/2022 1304   K 3.4 (L) 03/15/2023 0606   CL 98 03/15/2023 0606   CO2 23 03/15/2023 0606   GLUCOSE 118 (H) 03/15/2023 0606   BUN 11 03/15/2023 0606   BUN 19 08/13/2022 1304   CREATININE 0.83 03/15/2023 0606   CREATININE 0.68 11/13/2015 1047   CALCIUM 8.3 (L) 03/15/2023 0606   PROT 4.9 (L) 03/06/2023 0624   PROT 6.3 08/13/2022 1304   ALBUMIN 2.2 (L) 03/06/2023 0624   ALBUMIN 4.4 08/13/2022 1304   AST 12 (L) 03/06/2023 0624   ALT 9 03/06/2023 0624   ALKPHOS 52 03/06/2023 0624   BILITOT 0.5 03/06/2023 0624   BILITOT 0.5 08/13/2022 1304   GFRNONAA >60 03/15/2023 0606   GFRAA >60  12/11/2018 1222   Lipase     Component Value Date/Time   LIPASE 50 03/02/2023 1119       Studies/Results: VAS [place] ABI WITH/WO TBI  Result Date: 03/13/2023  LOWER EXTREMITY DOPPLER STUDY Patient Name:  Cindy Holmes  Date of Exam:   03/13/2023 Medical Rec #: [id number]        Accession #:    [id number] Date of Birth: 07/19/1950       Patient Gender: F Patient Age:   72 years Exam Location:  [facility] Procedure:      VAS [place] ABI WITH/WO TBI Referring Phys: Dorcas Carrow --------------------------------------------------------------------------------  Indications: Cool left foot with Dopplerable pulses per ordering MD High Risk Factors: Hypertension, hyperlipidemia, coronary artery disease. 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 Paroxysmal atrial fibrillation.  Limitations: Today's exam was limited due to Edema, irregular heart beat, pain              with cuff compression, patient refused to have blood pressure taken              in upper arm and involuntary patient  movement. Comparison Study: No prior study on file Performing Technologist: Sherren Kerns RVS  Examination Guidelines: A complete evaluation includes at minimum, Doppler waveform signals and systolic blood pressure reading at the level of bilateral brachial, anterior tibial, and posterior tibial arteries, when vessel segments are accessible. Bilateral testing is considered an integral part of a complete examination. Photoelectric Plethysmograph (PPG) waveforms and toe systolic pressure readings are included as required and additional duplex testing as needed. Limited examinations for reoccurring indications may be performed as noted.  ABI Findings: +---------+------------------+-----+--------+---------+ Right    Rt Pressure (mmHg)IndexWaveformComment   +---------+------------------+-----+--------+---------+ Brachial                                 Not taken +---------+------------------+-----+--------+---------+ PTA      254               2.47 biphasic          +---------+------------------+-----+--------+---------+ DP       142               1.38 biphasic          +---------+------------------+-----+--------+---------+ Great Toe81                0.79                   +---------+------------------+-----+--------+---------+ +---------+------------------+-----+---------+---------------+ Left     Lt Pressure (mmHg)IndexWaveform Comment         +---------+------------------+-----+---------+---------------+ Brachial 103                    triphasicRADIAL PRESSURE +---------+------------------+-----+---------+---------------+ PTA      169               1.64 biphasic                 +---------+------------------+-----+---------+---------------+ DP       254               2.47 biphasic                 +---------+------------------+-----+---------+---------------+ Great Toe62                0.60                          +---------+------------------+-----+---------+---------------+ +-------+---------------------+-----------+------------+------------+ ABI/TBIToday's ABI          Today's TBIPrevious ABIPrevious TBI +-------+---------------------+-----------+------------+------------+ Right  2.47 non compressible0.79                                +-------+---------------------+-----------+------------+------------+ Left   2.47 non compressible0.60                                +-------+---------------------+-----------+------------+------------+ Arterial wall calcification precludes accurate ankle pressures and ABIs.  Summary: Right: Resting right ankle-brachial index indicates noncompressible right lower extremity arteries. The right toe-brachial index is normal. Left: Resting left ankle-brachial index indicates noncompressible left lower extremity arteries. The left toe-brachial index is reduced.  *See table(s) above for measurements and observations.  Electronically signed by Lemar Livings MD on 03/13/2023 at 4:08:01 PM.    Final     Anti-infectives: Anti-infectives (From admission, onward)    Start     Dose/Rate Route Frequency Ordered Stop   03/08/23 1204  sodium chloride 0.9 %  with cefoTEtan (CEFOTAN) ADS Med       Note to Pharmacy: [facility] [place] L: cabinet override      03/08/23 1204 03/08/23 1247   03/07/23 0600  cefoTEtan (CEFOTAN) 2 g in sodium chloride 0.9 % 100 mL IVPB  Status:  Discontinued        2 g 200 mL/hr over 30 Minutes Intravenous On call to O.R. 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 Encourage PO intake, which is improving.  Had an adkins shake, banana, grits, and sever other things this morning for breakfast - mobilize, PT/OT  - recommending SNF, pt previously refusing and still wants to go home - WOC following for new ostomy. Will need ostomy clinic referral at discharge, order has been placed -patient is surgically stable to DC home when medically stable.  All follow up arranged in the AVS   ID - cefotetan periop FEN - reg diet VTE - Eliquis Foley - Can remove from surgical standpoint when appropriate by cards and medicine, likely plan to do today per patient and husband   CAD - eliquis CHF HTN HLD RA on Plaquenil and methotrexate.  Currently on hold Morbid obesity Anemia     LOS: 13 days    Letha Cape, [facility] 03/15/2023, 10:00 AM Please see Amion for pager number during day hours 7:00am-4:30pm

## 2023-03-15 NOTE — Addendum Note (Signed)
Addendum  created 03/15/23 1031 by Marquis Buggy, CRNA   Cosign clinical note with attestation

## 2023-03-15 NOTE — Progress Notes (Signed)
Patient refused medicine tonight,metoprolol,potassium,colace and tylenol. Gardner,MD made aware.

## 2023-03-15 NOTE — Progress Notes (Signed)
Physical Therapy Treatment Patient Details Name: Cindy Holmes MRN: [id number] DOB: 1951-05-12 Today's Date: 03/15/2023   History of Present Illness Pt is 72 yo presenting with persistent diarrhea, nausea and vomiting. Ex-lap with sigmoid colectomy and small bowel resection on 8/27. PMH: HTN, RA, CAD chronic HFpEF and hx CVA. Pt was recently discharged from hospital to home for similar issue.    PT Comments  Pt received in supine, agreeable to therapy session after premedication by RN and encouragement from medical team, pt anxious regarding anticipation of pain and with self-limiting statements but able to progress to OOB transfers today via hoyer lift. Pt needing max to totalA (increased assist to roll to her L side due to chronic RUE RTC injury and rolled x4 total reps. Pt totalA to lift OOB to chair and spouse present for instruction on technique to place lift pad under her. He expressed concerns about size of hoyer lift and may need more specific info on lift specifications to measure home to ensure they can use hoyer upon pt DC. Continue to recommend short term lower intensity post-acute rehab prior to home (<3 hours/day) as pt remains very limited in functional efforts and is at increased risk of functional decline due to slow progress toward mobility goals. Discussion on plan for tomorrow's session and pt agreeable to attempt standing with Stedy vs RW with OT but adamantly refused standing trials from chair today.   If plan is discharge home, recommend the following: Two people to help with walking and/or transfers;Two people to help with bathing/dressing/bathroom;Assistance with cooking/housework;Direct supervision/assist for medications management;Assist for transportation;Help with stairs or ramp for entrance   Can travel by private vehicle        Equipment Recommendations  [place] lift;Hospital bed;Other (comment) (bari wheelchair with pressure relief cushion and bari BSC if she does not  already have them)    Recommendations for Other Services       Precautions / Restrictions Precautions Precautions: Fall Precaution Comments: colostomy Restrictions Weight Bearing Restrictions: No     Mobility  Bed Mobility Overal bed mobility: Needs Assistance Bed Mobility: Rolling Rolling: Mod assist, Max assist, Used rails, +2 for physical assistance         General bed mobility comments: Mod/Max assist to guide Rt UE to reach Lt hand on bed rail and bed pad used to pivot hips to sidelying. Hoyer pad placed while pt rolled to L/R sides, pt able to perform rolls to L/R sides x2 ea side (x4 total) with no grimacing or significant c/o pain.    Transfers Overall transfer level: Needs assistance   Transfers: Bed to chair/wheelchair/BSC Sit to Stand: Via lift equipment           General transfer comment: Michiel Sites OOB to chair, previously agreed upon with pt. Once up in recliner, pt defers standing trials today and states "maybe tomorrow" when tech/PTA discussing Stedy vs RW as options. Transfer via Lift Equipment: Maximove  Ambulation/Gait                   Stairs             Wheelchair Mobility     Tilt Bed    Modified Rankin (Stroke Patients Only)       Balance Overall balance assessment: Needs assistance Sitting-balance support: Single extremity supported, Feet supported Sitting balance-Leahy Scale: Poor Sitting balance - Comments: sitting forward in chair briefly but limited due to c/o lightheadedness, nausea and discomfort  Standing balance comment: pt refusing to attempt                            Cognition Arousal: Alert Behavior During Therapy: Flat affect Overall Cognitive Status: Impaired/Different from baseline Area of Impairment: Problem solving, Following commands, Safety/judgement, Awareness                       Following Commands: Follows one step commands inconsistently, Follows multi-step commands  inconsistently Safety/Judgement: Decreased awareness of deficits Awareness: Intellectual (no real awareness of deficits and current level of needs/care/assist if she were to go home.) Problem Solving: Slow processing, Requires verbal cues, Decreased initiation, Requires tactile cues General Comments: pt self limiting, but had received encouragement from MD and RN prior to session and was agreeable to participate in session along with spouse and sister providing encouragement. Patient with little to no intrinsic motivators to attempt mobility and often complains of injuries from many years before along with chronic medical conditions (afib, etc) as reasoning for not wanting to participate, but today agreeable to attempt hoyer OOB.        Exercises General Exercises - Lower Extremity Ankle Circles/Pumps: Both, 10 reps, Supine, AROM Heel Slides: AROM, Both, 10 reps, Supine    General Comments General comments (skin integrity, edema, etc.): BP 120/62 (81) HR 92 bpm sitting in recliner with BLE elevated, c/o nausea, RN notified      Pertinent Vitals/Pain Pain Assessment Pain Assessment: Faces Faces Pain Scale: Hurts a little bit Pain Location: generalized R leg, L knee, R shoulder, ostomy area in abdomen Pain Descriptors / Indicators: Discomfort Pain Intervention(s): Monitored during session, Limited activity within patient's tolerance, Repositioned, Premedicated before session     PT Goals (current goals can now be found in the care plan section) Acute Rehab PT Goals Patient Stated Goal: Less pain and moving around better so I can go home. PT Goal Formulation: With patient/family Time For Goal Achievement: 03/23/23 Progress towards PT goals: Progressing toward goals (slowly)    Frequency    Min 1X/week      PT Plan         AM-PAC PT "6 Clicks" Mobility   Outcome Measure  Help needed turning from your back to your side while in a flat bed without using bedrails?: A  Lot Help needed moving from lying on your back to sitting on the side of a flat bed without using bedrails?: Total Help needed moving to and from a bed to a chair (including a wheelchair)?: Total Help needed standing up from a chair using your arms (e.g., wheelchair or bedside chair)?: Total Help needed to walk in hospital room?: Total Help needed climbing 3-5 steps with a railing? : Total 6 Click Score: 7    End of Session Equipment Utilized During Treatment: Other (comment) (lift pad) Activity Tolerance: Patient limited by pain;Other (comment);Patient limited by fatigue (pt self limiting) Patient left: with call bell/phone within reach;with family/visitor present;in chair;with chair alarm set;Other (comment) (spouse and sister in room with her) Nurse Communication: Mobility status;Need for lift equipment PT Visit Diagnosis: History of falling (Z91.81);Muscle weakness (generalized) (M62.81)     Time: 9563-8756 PT Time Calculation (min) (ACUTE ONLY): 30 min  Charges:    $Therapeutic Activity: 23-37 mins PT General Charges $$ ACUTE PT VISIT: 1 Visit                     Ramsay Bognar  P., PTA Acute Rehabilitation Services Secure Chat Preferred 9a-5:30pm Office: [phone]    Dorathy Kinsman [facility] 03/15/2023, 6:28 PM

## 2023-03-15 NOTE — Progress Notes (Signed)
PROGRESS NOTE    Cindy Holmes  OZD:[id number] DOB: Dec 05, 1950 DOA: 03/02/2023 PCP: [name], MD    Brief Narrative:  72 year old with hypertension, rheumatoid arthritis on immunosuppressive's, coronary artery disease, chronic diastolic heart failure, history of stroke presented to the ER with persistent diarrhea, nausea and vomiting.  Underwent colonoscopy and found to have sigmoid colon stenosis.  Surgery consulted.  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 Remains in the hospital, recovering from surgery.  Difficult mobility and poor participation.  Assessment & Plan:   Sigmoid stricture, overflow diarrhea with recent history of enteropathogenic E. coli: Colonoscopy with a strictured benign Ex lap, resection and colostomy.  Bowel function returned.  Tolerating regular diet. Pain management with oral pain medications.  Acute kidney injury: multifactorial.  Renal function stable.  Tolerating torsemide.  Paroxysmal A-fib: Rate controlled.  On metoprolol.  Eliquis resumed.  Hemoglobin stable.     Rheumatoid arthritis: On methotrexate and Plaquenil on hold for procedure and wound healing.  Will resume on discharge.  Hyperlipidemia: On statin.  Acute on chronic diastolic heart failure with pulmonary hypertension, bilateral leg edema, vascular congestion: Echocardiogram with ejection fraction of 70%.  Severely elevated pulmonary artery systolic pressures.  Cardiology has seen patient preop.  Back on torsemide.  Scheduled potassium.  Anemia of chronic disease: Hemoglobin stable at around 8-9.  Will continue to monitor.  Hypokalemia: Replaced.  On scheduled replacement.  Additional potassium today.  Poor circulation left leg: Has Doppler pulses.  Patient does have compromised ABI on the left foot.  No evidence of vascular compromise.  Currently does not need intervention.  Continue to work with PT OT.  Once mobile, take Foley catheter out.  Poorly motivated to mobilize. If  she is not able to have good mobility, needs to go to SNF.   DVT prophylaxis:  apixaban (ELIQUIS) tablet 5 mg   Code Status: Full code Family Communication: Husband at the bedside. Disposition Plan: Status is: Inpatient Remains inpatient appropriate because: Postoperative.  Recovering.  Unable to mobilize yet.     Consultants:  General surgery Cardiology GI  Procedures:  Colonoscopy 8/23  end colostomy 8/27  Antimicrobials:  Completed   Subjective: Patient seen and examined.  Quite upset about plans to mobilize without her being ready.  Patient declined to have Foley catheter removed last night.  She explains about head-to-toe surgeries, deconditioning and difficulty mobility and people forcing her to move and work with PT.  Husband at the bedside.  She wants to improve some mobility and wants to go home.  Objective: Vitals:   03/14/23 1630 03/14/23 2141 03/15/23 0456 03/15/23 0746  BP: (!) 110/41 (!) 117/48 (!) 94/50 112/70  Pulse: (!) 103 98 88 (!) 103  Resp: 18 18  16   Temp: 98.5 F (36.9 C) 98 F (36.7 C) 97.6 F (36.4 C) 98.4 F (36.9 C)  TempSrc:  Oral  Oral  SpO2: 93% 100% 99% 95%  Weight:      Height:        Intake/Output Summary (Last 24 hours) at 03/15/2023 1242 Last data filed at 03/15/2023 1229 Gross per 24 hour  Intake 240 ml  Output 2125 ml  Net -1885 ml   Filed Weights   03/02/23 1115 03/08/23 1018 03/13/23 0500  Weight: 104.3 kg 99.8 kg 101 kg    Examination:  General: Comfortable at rest.  Appropriately anxious. Cardiovascular: S1-S2 normal.  Irregularly irregular. Respiratory: No added sounds. Gastrointestinal: Soft.  Pendulous.  Obese.  Midline incision clean and dry.  Left lower quadrant stoma with loose brown stool. Ext: 1+ edema both legs. Normal color and temperature of both feet.    Data Reviewed: I have personally reviewed following labs and imaging studies  CBC: Recent Labs  Lab 03/09/23 0524 03/10/23 0458 03/13/23 0447  03/14/23 0456  WBC 10.6* 10.4 8.2 8.5  HGB 8.4* 9.2* 7.8* 8.2*  HCT 26.4* 30.0* 24.8* 25.4*  MCV 92.0 96.2 95.0 94.1  PLT 267 262 272 292   Basic Metabolic Panel: Recent Labs  Lab 03/09/23 0513 03/09/23 0524 03/10/23 0458 03/12/23 0633 03/13/23 0447 03/15/23 0606  NA 132*  --  134* 133* 134* 134*  K 4.3  --  4.3 3.4* 3.4* 3.4*  CL 105  --  104 104 105 98  CO2 15*  --  19* 21* 22 23  GLUCOSE 133*  --  123* 95 100* 118*  BUN 17  --  18 18 17 11   CREATININE 1.21*  --  1.13* 1.25* 0.97 0.83  CALCIUM 7.3*  --  7.6* 7.9* 8.0* 8.3*  MG  --  1.9 2.1  --   --   --    GFR: Estimated Creatinine Clearance: 70.5 mL/min (by C-G formula based on SCr of 0.83 mg/dL). Liver Function Tests: No results for input(s): "AST", "ALT", "ALKPHOS", "BILITOT", "PROT", "ALBUMIN" in the last 168 hours.  No results for input(s): "LIPASE", "AMYLASE" in the last 168 hours. No results for input(s): "AMMONIA" in the last 168 hours. Coagulation Profile: No results for input(s): "INR", "PROTIME" in the last 168 hours. Cardiac Enzymes: No results for input(s): "CKTOTAL", "CKMB", "CKMBINDEX", "TROPONINI" in the last 168 hours. BNP (last 3 results) Recent Labs    05/19/22 1204  PROBNP 380*   HbA1C: No results for input(s): "HGBA1C" in the last 72 hours.  CBG: No results for input(s): "GLUCAP" in the last 168 hours. Lipid Profile: No results for input(s): "CHOL", "HDL", "LDLCALC", "TRIG", "CHOLHDL", "LDLDIRECT" in the last 72 hours. Thyroid Function Tests: No results for input(s): "TSH", "T4TOTAL", "FREET4", "T3FREE", "THYROIDAB" in the last 72 hours. Anemia Panel: No results for input(s): "VITAMINB12", "FOLATE", "FERRITIN", "TIBC", "IRON", "RETICCTPCT" in the last 72 hours. Sepsis Labs: No results for input(s): "PROCALCITON", "LATICACIDVEN" in the last 168 hours.   Recent Results (from the past 240 hour(s))  Surgical PCR screen     Status: None   Collection Time: 03/06/23 11:44 PM   Specimen:  Nasal Mucosa; Nasal Swab  Result Value Ref Range Status   MRSA, PCR NEGATIVE NEGATIVE Final   Staphylococcus aureus NEGATIVE NEGATIVE Final    Comment: (NOTE) The Xpert SA Assay (FDA approved for NASAL specimens in patients 30 years of age and older), is one component of a comprehensive surveillance program. It is not intended to diagnose infection nor to guide or monitor treatment. Performed at [facility], [address], [place], [place] [address]          Radiology Studies: VAS [place] ABI WITH/WO TBI  Result Date: 03/13/2023  LOWER EXTREMITY DOPPLER STUDY Patient Name:  LUISE YOWELL  Date of Exam:   03/13/2023 Medical Rec #: [id number]        Accession #:    [id number] Date of Birth: 1951/06/02       Patient Gender: F Patient Age:   60 years Exam Location:  [facility] Procedure:      VAS [place] ABI WITH/WO TBI Referring Phys: Dorcas Carrow --------------------------------------------------------------------------------  Indications: Cool left foot with Dopplerable pulses per ordering  MD High Risk Factors: Hypertension, hyperlipidemia, coronary artery disease. 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 Paroxysmal atrial fibrillation.  Limitations: Today's exam was limited due to Edema, irregular heart beat, pain              with cuff compression, patient refused to have blood pressure taken              in upper arm and involuntary patient movement. Comparison Study: No prior study on file Performing Technologist: Sherren Kerns RVS  Examination Guidelines: A complete evaluation includes at minimum, Doppler waveform signals and systolic blood pressure reading at the level of bilateral brachial, anterior tibial, and posterior tibial arteries, when vessel segments are accessible. Bilateral testing is considered an integral part of a complete examination. Photoelectric Plethysmograph (PPG) waveforms and toe  systolic pressure readings are included as required and additional duplex testing as needed. Limited examinations for reoccurring indications may be performed as noted.  ABI Findings: +---------+------------------+-----+--------+---------+ Right    Rt Pressure (mmHg)IndexWaveformComment   +---------+------------------+-----+--------+---------+ Brachial                                Not taken +---------+------------------+-----+--------+---------+ PTA      254               2.47 biphasic          +---------+------------------+-----+--------+---------+ DP       142               1.38 biphasic          +---------+------------------+-----+--------+---------+ Great Toe81                0.79                   +---------+------------------+-----+--------+---------+ +---------+------------------+-----+---------+---------------+ Left     Lt Pressure (mmHg)IndexWaveform Comment         +---------+------------------+-----+---------+---------------+ Brachial 103                    triphasicRADIAL PRESSURE +---------+------------------+-----+---------+---------------+ PTA      169               1.64 biphasic                 +---------+------------------+-----+---------+---------------+ DP       254               2.47 biphasic                 +---------+------------------+-----+---------+---------------+ Great Toe62                0.60                          +---------+------------------+-----+---------+---------------+ +-------+---------------------+-----------+------------+------------+ ABI/TBIToday's ABI          Today's TBIPrevious ABIPrevious TBI +-------+---------------------+-----------+------------+------------+ Right  2.47 non compressible0.79                                +-------+---------------------+-----------+------------+------------+ Left   2.47 non compressible0.60                                 +-------+---------------------+-----------+------------+------------+ Arterial  wall calcification precludes accurate ankle pressures and ABIs.  Summary: Right: Resting right ankle-brachial index indicates noncompressible right lower extremity arteries. The right toe-brachial index is normal. Left: Resting left ankle-brachial index indicates noncompressible left lower extremity arteries. The left toe-brachial index is reduced. *See table(s) above for measurements and observations.  Electronically signed by Lemar Livings MD on 03/13/2023 at 4:08:01 PM.    Final         Scheduled Meds:  acetaminophen  1,000 mg Oral Q6H   apixaban  5 mg Oral BID   Chlorhexidine Gluconate Cloth  6 each Topical Daily   docusate sodium  100 mg Oral BID   feeding supplement  237 mL Oral BID BM   metoprolol tartrate  25 mg Oral BID   potassium chloride  40 mEq Oral BID   potassium chloride  40 mEq Oral Once   torsemide  20 mg Oral BID   Continuous Infusions:     LOS: 13 days    Time spent: 35 minutes     Dorcas Carrow, MD [facility]

## 2023-03-15 NOTE — Plan of Care (Signed)
  Problem: Clinical Measurements: Goal: Will remain free from infection Outcome: Progressing   Problem: Nutrition: Goal: Adequate nutrition will be maintained Outcome: Progressing   Problem: Safety: Goal: Ability to remain free from injury will improve Outcome: Progressing   

## 2023-03-15 NOTE — TOC Progression Note (Signed)
Transition of Care [facility]) - Progression Note    Patient Details  Name: Cindy Holmes MRN: [id number] Date of Birth: December 13, 1950  Transition of Care [facility] CM/SW Contact  Tom-Johnson, Hershal Coria, RN Phone Number: 03/15/2023, 4:51 PM  Clinical Narrative:     CM informed by patient that she would like Home Health at discharge. Patient states she does not have any preference. CM called in referral to [facility] and Tresa Endo voiced acceptance, info on AVS.   CM will continue to follow as patient progresses with care towards discharge.        Expected Discharge Plan: Home w Home Health Services Barriers to Discharge: Continued Medical Work up  Expected Discharge Plan and Services                         DME Arranged: Hospital bed [facility] lift) DME Agency: [employer] Date DME Agency Contacted: 03/11/23 Time DME Agency Contacted: [phone] Representative spoke with at DME Agency: Earna Coder HH Arranged: Refused HH HH Agency: NA         Social Determinants of Health (SDOH) Interventions SDOH Screenings   Food Insecurity: No Food Insecurity (03/05/2023)  Housing: Low Risk  (03/05/2023)  Transportation Needs: No Transportation Needs (03/05/2023)  Utilities: Not At Risk (03/05/2023)  Tobacco Use: Low Risk  (03/08/2023)    Readmission Risk Interventions    03/03/2023    3:06 PM 02/03/2023    3:58 PM  Readmission Risk Prevention Plan  Transportation Screening Complete Complete  PCP or Specialist Appt within 3-5 Days Complete Complete  HRI or Home Care Consult Complete Complete  Social Work Consult for Recovery Care Planning/Counseling Complete Complete  Palliative Care Screening Not Applicable Not Applicable  Medication Review Oceanographer) Referral to Pharmacy Referral to Pharmacy

## 2023-03-15 NOTE — Progress Notes (Signed)
Pt has requested her foley catheter be emptied and assessed twice during shift, states she is concerned her foley has been "turned off". Foley bag emptied and education provided. However, Pt. continued to express this concern post education.

## 2023-03-16 DIAGNOSIS — N179 Acute kidney failure, unspecified: Secondary | ICD-10-CM | POA: Diagnosis not present

## 2023-03-16 MED ORDER — TRAMADOL HCL 50 MG PO TABS: 50.0000 mg | ORAL_TABLET | Freq: Four times a day (QID) | ORAL | Status: DC | PRN

## 2023-03-16 NOTE — Progress Notes (Signed)
Physical Therapy Treatment Patient Details Name: Cindy Holmes MRN: [id number] DOB: 1950-09-21 Today's Date: 03/16/2023   History of Present Illness Pt is 72 yo presenting with persistent diarrhea, nausea and vomiting. Ex-lap with sigmoid colectomy and small bowel resection on 8/27. PMH: HTN, RA, CAD chronic HFpEF and hx CVA. Pt was recently discharged from hospital to home for similar issue.    PT Comments  Pt received in supine, c/o nausea from having oxycodone at 1:30pm previous date (>24 hours prior) and refusing attempts at transfer to EOB or standing trials despite max encouragement from PTA. Pt spouse present and not encouraging her to attempt OOB mobility. Pt instructed on risks of immobility including bed sores and poor hemodynamic stability which is likely feeding into her nausea when HOB elevated, but pt continues to refuse EOB despite education. Pt given HEP handout to reinforce BLE exercises in supine. Pt assisted to scoot toward [facility] and to position in chair posture, but does not tolerate HOB >36* due to c/o nausea. Pt may benefit from vestibular PT assessment vs orthostatic vitals assessment next session during transfer training. RN/family notified she will need to start working on transfer training as she plans to refuse SNF. Frequency updated as per pt poor progress toward goals. Pt continues to benefit from PT services to progress toward functional mobility goals.    If plan is discharge home, recommend the following: Two people to help with walking and/or transfers;Two people to help with bathing/dressing/bathroom;Assistance with cooking/housework;Direct supervision/assist for medications management;Assist for transportation;Help with stairs or ramp for entrance   Can travel by private vehicle        Equipment Recommendations  [place] lift;Hospital bed;Other (comment) (bari wheelchair with pressure relief cushion and bari BSC if she does not already have them)    Recommendations  for Other Services       Precautions / Restrictions Precautions Precautions: Fall Precaution Comments: colostomy Restrictions Weight Bearing Restrictions: No     Mobility  Bed Mobility Overal bed mobility: Needs Assistance Bed Mobility: Supine to Sit     Supine to sit: Max assist, HOB elevated, Used rails     General bed mobility comments: Partial transfer to long sitting in bed, pt pulling on bed rail on L side, only tolerates 10-15 seconds at a time x2 attempts. Posterior supine scoot with bed in trendelenburg pt pulling on overhead rail to assist with LUE. Bed placed in chair posture wtih HOB to 50* but pt c/o nausea and severe dizziness and PTA brought it back to 36* with pt reporting improvement in symptoms.    Transfers                   General transfer comment: pt refusing EOB/standing this date.    Ambulation/Gait                   Stairs             Wheelchair Mobility     Tilt Bed    Modified Rankin (Stroke Patients Only)       Balance Overall balance assessment: Needs assistance Sitting-balance support: Single extremity supported, Feet supported Sitting balance-Leahy Scale: Zero Sitting balance - Comments: does not tolerate long sitting in bed more than a few seconds       Standing balance comment: pt refusing to attempt  Cognition Arousal: Alert Behavior During Therapy: WFL for tasks assessed/performed Overall Cognitive Status: Within Functional Limits for tasks assessed                                 General Comments: Pt with c/o feeling unwell, reports that the oxy she had the other day has limited her. Pt self-limiting throughout and adamant that she does not want pain meds (even Tylenol) and will not sit up due to nausea and concern for vomiting. Pt allowed PTA to raise HOB to 50*, then states it must be lowered due to nausea, pt allows HOB up to 36*, PTA reinforcing  need to have HOB elevated to eat dinner (which according to schedule should be arriving soon) and to reduce GI symptoms and decrease aspiration risk. HOB locked at 30*, RN notified to prevent her from trying to eat in supine/flat posture.        Exercises Other Exercises Other Exercises: HEP handout brought to room to reinforce BLE exercises    General Comments General comments (skin integrity, edema, etc.): Pt husband present      Pertinent Vitals/Pain Pain Assessment Pain Assessment: Faces Faces Pain Scale: Hurts little more Pain Location: generalized Pain Descriptors / Indicators: Discomfort Pain Intervention(s): Limited activity within patient's tolerance, Monitored during session, Repositioned, Other (comment) (pt refusing pain meds today due to nausea yesterday with oxycodone)    Home Living                          Prior Function            PT Goals (current goals can now be found in the care plan section) Acute Rehab PT Goals PT Goal Formulation: With patient/family Time For Goal Achievement: 03/23/23 Progress towards PT goals: Progressing toward goals    Frequency    Min 1X/week      PT Plan      Co-evaluation              AM-PAC PT "6 Clicks" Mobility   Outcome Measure  Help needed turning from your back to your side while in a flat bed without using bedrails?: A Lot Help needed moving from lying on your back to sitting on the side of a flat bed without using bedrails?: Total Help needed moving to and from a bed to a chair (including a wheelchair)?: Total Help needed standing up from a chair using your arms (e.g., wheelchair or bedside chair)?: Total Help needed to walk in hospital room?: Total Help needed climbing 3-5 steps with a railing? : Total 6 Click Score: 7    End of Session   Activity Tolerance: Patient limited by fatigue;Treatment limited secondary to medical complications (Comment);Other (comment) (self-limiting behaviors  and c/o nausea and dizziness with postural changes in bed) Patient left: in bed;with call bell/phone within reach;with bed alarm set;Other (comment) (HOB at 38*) Nurse Communication: Mobility status;Need for lift equipment;Other (comment) (hoyer OOB; nausea) PT Visit Diagnosis: History of falling (Z91.81);Muscle weakness (generalized) (M62.81)     Time: 5176-1607 PT Time Calculation (min) (ACUTE ONLY): 15 min  Charges:    $Therapeutic Activity: 8-22 mins PT General Charges $$ ACUTE PT VISIT: 1 Visit                     Medardo Hassing P., PTA Acute Rehabilitation Services Secure Chat Preferred 9a-5:30pm Office: [phone]  Jonathyn Carothers M Obdulio Mash 03/16/2023, 6:09 PM

## 2023-03-16 NOTE — Progress Notes (Signed)
Patient refused to take Potassium chloride, asked if she wants to take liquid or IV but she refused and said will take tomorrow. MD aware.

## 2023-03-16 NOTE — Progress Notes (Signed)
Spoke with patient about Foley Catheter removal, pt. States she does not want Foley catheter removed since she is unable to get out of bed to go to the bathroom, refusing purewick, pt. Educated about risk of prolonged Foley catheter use including infection, pt. Verbalized understanding and is still refusing to have Foley catheter removed, MD made aware  Cindy Holmes

## 2023-03-16 NOTE — Progress Notes (Signed)
[facility] Progress Note  8 Days Post-Op  Subjective: CC-  Husband and sister at bedside. Protein drink x2 yesterday, eating small amounts. Ostomy productive. Vomited with oxy yesterday, but says she never tolerates this well.  Working with therapies and wants to go home.  Objective: Vital signs in last 24 hours: Temp:  [97.6 F (36.4 C)-99.1 F (37.3 C)] 99.1 F (37.3 C) (09/04 0635) Pulse Rate:  [84-100] 84 (09/04 0845) Resp:  [18] 18 (09/04 0845) BP: (108-152)/(44-82) 112/56 (09/04 0845) SpO2:  [96 %-99 %] 96 % (09/04 0845) Last BM Date : 03/15/23  Intake/Output from previous day: 09/03 0701 - 09/04 0700 In: 60 [P.O.:60] Out: 1275 [Urine:1175; Stool:100] Intake/Output this shift: No intake/output data recorded.  PE: Gen:  Alert, NAD Abd: soft, ND, nontender, ostomy difficult to visualize/ soft dark brown stool in pouch, midline with staples present and no erythema or drainage  Lab Results:  Recent Labs    03/14/23 0456  WBC 8.5  HGB 8.2*  HCT 25.4*  PLT 292   BMET Recent Labs    03/15/23 0606  NA 134*  K 3.4*  CL 98  CO2 23  GLUCOSE 118*  BUN 11  CREATININE 0.83  CALCIUM 8.3*   PT/INR No results for input(s): "LABPROT", "INR" in the last 72 hours. CMP     Component Value Date/Time   NA 134 (L) 03/15/2023 0606   NA 141 08/13/2022 1304   K 3.4 (L) 03/15/2023 0606   CL 98 03/15/2023 0606   CO2 23 03/15/2023 0606   GLUCOSE 118 (H) 03/15/2023 0606   BUN 11 03/15/2023 0606   BUN 19 08/13/2022 1304   CREATININE 0.83 03/15/2023 0606   CREATININE 0.68 11/13/2015 1047   CALCIUM 8.3 (L) 03/15/2023 0606   PROT 4.9 (L) 03/06/2023 0624   PROT 6.3 08/13/2022 1304   ALBUMIN 2.2 (L) 03/06/2023 0624   ALBUMIN 4.4 08/13/2022 1304   AST 12 (L) 03/06/2023 0624   ALT 9 03/06/2023 0624   ALKPHOS 52 03/06/2023 0624   BILITOT 0.5 03/06/2023 0624   BILITOT 0.5 08/13/2022 1304   GFRNONAA >60 03/15/2023 0606   GFRAA >60 12/11/2018 1222   Lipase      Component Value Date/Time   LIPASE 50 03/02/2023 1119       Studies/Results: No results found.  Anti-infectives: Anti-infectives (From admission, onward)    Start     Dose/Rate Route Frequency Ordered Stop   03/08/23 1204  sodium chloride 0.9 % with cefoTEtan (CEFOTAN) ADS Med       Note to Pharmacy: Susy [facility] L: cabinet override      03/08/23 1204 03/08/23 1247   03/07/23 0600  cefoTEtan (CEFOTAN) 2 g in sodium chloride 0.9 % 100 mL IVPB  Status:  Discontinued        2 g 200 mL/hr over 30 Minutes Intravenous On call to O.R. 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 - surgical path: Segment of colon (11.5 cm) showing diverticulosis, diverticulitis with  associated stricture and serosal adhesions, adherent benign unremarkable ovary, margins appear viable; small intestine with serosal adhesions and associated  reactive changes, margins appear viable  >> this has been discussed with the patient and her family - ostomy retracted but functioning, monitor - Continue regular diet, protein shakes. Encourage PO intake, which is improving - mobilize, PT/OT - recommending SNF, pt previously refusing and still wants to go home - [facility]OC following for new ostomy. Will need ostomy clinic referral at discharge, order has been placed -  change oxy to tramadol prn - patient is surgically stable to DC home when medically stable.  All follow up arranged in the AVS. We will sign off, please call with questions or concerns.    ID - cefotetan periop FEN - reg diet VTE - Eliquis Foley - Can remove from surgical standpoint when appropriate by cards and medicine   CAD - eliquis CHF HTN HLD RA on Plaquenil and methotrexate.  Currently on hold Morbid obesity Anemia     LOS: 14 days    Franne Forts, [facility] Surgery 03/16/2023, 10:27 AM Please see Amion for pager number during day hours 7:00am-4:30pm

## 2023-03-16 NOTE — Progress Notes (Signed)
PROGRESS NOTE    Cindy Holmes  ZOX:[id number] DOB: 25-Jul-1950 DOA: 03/02/2023 PCP: [name], MD  Chief Complaint  Patient presents with   Emesis   Diarrhea    Brief Narrative:   72 year old with hypertension, rheumatoid arthritis on immunosuppressive's, coronary artery disease, chronic diastolic heart failure, history of stroke presented to the ER with persistent diarrhea, nausea and vomiting.  Underwent colonoscopy and found to have sigmoid colon stenosis.  Surgery consulted.  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 Remains in the hospital, recovering from surgery.  Difficult mobility and poor participation.  Assessment & Plan:   Principal Problem:   AKI (acute kidney injury) (HCC) Active Problems:   Stricture of sigmoid colon (HCC)   Diarrhea   Essential hypertension   Hyperlipidemia   Rheumatoid arthritis (HCC)   PAF (paroxysmal atrial fibrillation) (HCC)   Morbid obesity with body mass index (BMI) of 40.0 to 49.9 (HCC)   Immunosuppression due to drug therapy [facility])   Wheelchair dependent  Sigmoid stricture, overflow diarrhea with recent history of enteropathogenic E. coli: Colonoscopy with Zarin Knupp strictured benign S/p sigmoid colectomy with Hartman's procedure and end colostomy, small bowel resection  Tolerating regular diet. Pain management with oral pain medications.   Acute kidney injury: multifactorial.  Renal function stable.  Tolerating torsemide.   Paroxysmal [place]-fib: Rate controlled.  On metoprolol.  Eliquis resumed.  Hemoglobin stable.      Rheumatoid arthritis: On methotrexate and Plaquenil on hold for procedure and wound healing.  Will resume on discharge.   Hyperlipidemia: On statin.   Acute on chronic diastolic heart failure with pulmonary hypertension, bilateral leg edema, vascular congestion: Echocardiogram with ejection fraction of 70%.  Severely elevated pulmonary artery systolic pressures.  Cardiology has seen patient preop (last note appears  to be 8/30).  Back on torsemide.  Scheduled potassium.   Anemia of chronic disease: Hemoglobin stable at around 8-9.  Will continue to monitor.   Hypokalemia: replace and follow    Poor circulation left leg: Has Doppler pulses per my partner's evaluation.  ABI with noncompresssible LLE arternies, reduced L toe brachial index (noncompressible RLE arteries, normal R toe brachial index).  Would benefit from outpatient vascular follow up.   Continue to work with PT OT.  Once mobile, take Foley catheter out.  Poorly motivated to mobilize. If she is not able to have good mobility, needs to go to SNF.    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 Left ventricular ejection fraction, by estimation, is 65 to 70%. The  left ventricle has normal function. The left ventricle has no regional  wall motion abnormalities. Left ventricular diastolic parameters are  consistent with Grade II diastolic  dysfunction (pseudonormalization).   2. Right ventricular systolic function is normal. The right ventricular  size is dilated. There is severely elevated pulmonary artery systolic  pressure. The estimated right ventricular systolic pressure is 61.5 mmHg.   3. Left atrial size was mildly dilated.   4. The mitral valve is normal in structure. Mild to moderate mitral valve  regurgitation.   5. Multiple jets. Tricuspid valve regurgitation is mild to moderate.   6. The aortic valve is tricuspid. Aortic valve regurgitation is not  visualized. No  aortic stenosis is present.   7. The inferior vena cava is dilated in size with <50% respiratory  variability, suggesting right atrial  pressure of 15 mmHg.   Comparison(s): Prior images reviewed side by side. RV is dilated compared  to prior on 4 chamber live views. RVSP has increased.   Antimicrobials:  Anti-infectives (From admission, onward)    Start     Dose/Rate Route Frequency Ordered Stop   03/08/23 1204  sodium chloride 0.9 % with cefoTEtan (CEFOTAN) ADS Med       Note to Pharmacy: Susy [facility] L: cabinet override      03/08/23 1204 03/08/23 1247   03/07/23 0600  cefoTEtan (CEFOTAN) 2 g in sodium chloride 0.9 % 100 mL IVPB  Status:  Discontinued        2 g 200 mL/hr over 30 Minutes Intravenous On call to O.R. 03/06/23 1037 03/08/23 0559   03/06/23 1400  neomycin (MYCIFRADIN) tablet 1,000 mg       Placed in "And" Linked Group   1,000 mg Oral 3 times per day 03/06/23 1037 03/06/23 2243   03/06/23 1400  metroNIDAZOLE (FLAGYL) tablet 1,000 mg       Placed in "And" Linked Group   1,000 mg Oral 3 times per day 03/06/23 1037 03/07/23 1359   03/02/23 2000  azithromycin (ZITHROMAX) 500 mg in sodium chloride 0.9 % 250 mL IVPB  Status:  Discontinued        500 mg 250 mL/hr over 60 Minutes Intravenous Every 24 hours 03/02/23 1959 03/04/23 1745       Subjective: Doesn't want nausea or pain meds today  Objective: Vitals:   03/15/23 2127 03/16/23 0635 03/16/23 0845 03/16/23 1658  BP: 111/62 (!) 108/44 (!) 112/56 (!) 97/52  Pulse: 92 95 84 90  Resp:  18 18 18   Temp: 97.6 F (36.4 C) 99.1 F (37.3 C) 98.6 F (37 C) 98.4 F (36.9 C)  TempSrc: Oral Oral    SpO2: 99% 99% 96% 97%  Weight:      Height:        Intake/Output Summary (Last 24 hours) at 03/16/2023 1825 Last data filed at 03/16/2023 1700 Gross per 24 hour  Intake 540 ml  Output 1350 ml  Net -810 ml   Filed Weights   03/02/23 1115 03/08/23 1018 03/13/23 0500  Weight: 104.3 kg 99.8 kg 101 kg    Examination:  General exam: Appears calm and comfortable  Respiratory system: unlabored Cardiovascular system:RRR. Gastrointestinal system: midline  incision with staples healing well, ostomy Central nervous system: Alert and oriented. No focal neurological deficits. Extremities: no LEE    Data Reviewed: I have personally reviewed following labs and imaging studies  CBC: Recent Labs  Lab 03/10/23 0458 03/13/23 0447 03/14/23 0456  WBC 10.4 8.2 8.5  HGB 9.2* 7.8* 8.2*  HCT 30.0* 24.8* 25.4*  MCV 96.2 95.0 94.1  PLT 262 272 292    Basic Metabolic Panel: Recent Labs  Lab 03/10/23 0458 03/12/23 0633 03/13/23 0447 03/15/23 0606  NA 134* 133* 134* 134*  K 4.3 3.4* 3.4* 3.4*  CL 104 104 105 98  CO2 19* 21* 22 23  GLUCOSE 123* 95 100* 118*  BUN 18 18 17 11   CREATININE 1.13* 1.25* 0.97 0.83  CALCIUM 7.6* 7.9* 8.0* 8.3*  MG 2.1  --   --   --     GFR: Estimated Creatinine Clearance: 70.5 mL/min (by C-G formula based on SCr of 0.83 mg/dL).  Liver Function Tests: No  results for input(s): "AST", "ALT", "ALKPHOS", "BILITOT", "PROT", "ALBUMIN" in the last 168 hours.  CBG: No results for input(s): "GLUCAP" in the last 168 hours.   Recent Results (from the past 240 hour(s))  Surgical PCR screen     Status: None   Collection Time: 03/06/23 11:44 PM   Specimen: Nasal Mucosa; Nasal Swab  Result Value Ref Range Status   MRSA, PCR NEGATIVE NEGATIVE Final   Staphylococcus aureus NEGATIVE NEGATIVE Final    Comment: (NOTE) The Xpert SA Assay (FDA approved for NASAL specimens in patients 75 years of age and older), is one component of [facility] comprehensive surveillance program. It is not intended to diagnose infection nor to guide or monitor treatment. Performed at [facility], [address], [place], [place] [address]          Radiology Studies: No results found.      Scheduled Meds:  acetaminophen  1,000 mg Oral Q6H   apixaban  5 mg Oral BID   Chlorhexidine Gluconate Cloth  6 each Topical Daily   docusate sodium  100 mg Oral BID   feeding supplement  237 mL Oral BID BM   metoprolol tartrate  25 mg Oral  BID   potassium chloride  40 mEq Oral BID   torsemide  20 mg Oral BID   Continuous Infusions:   LOS: 14 days    Time spent: over 30 min    Lacretia Nicks, MD [facility]   To contact the attending provider between 7A-7P or the covering provider during after hours 7P-7A, please log into the web site [email or web address] and access using universal [place] password for that web site. If you do not have the password, please call the hospital operator.  03/16/2023, 6:25 PM

## 2023-03-16 NOTE — Progress Notes (Signed)
Occupational Therapy Treatment Patient Details Name: Cindy Holmes MRN: [id number] DOB: 15-Aug-1950 Today's Date: 03/16/2023   History of present illness Pt is 72 yo presenting with persistent diarrhea, nausea and vomiting. Ex-lap with sigmoid colectomy and small bowel resection on 8/27. PMH: HTN, RA, CAD chronic HFpEF and hx CVA. Pt was recently discharged from hospital to home for similar issue.   OT comments  Pt continues to present with a self limiting behavior, OT pushed for OOB mobility and attempts of STS today but pt declined due to feeling unwell after adverse reactions to oxy medications yesterday, and bouts of dry heaving today. During discussion with pt to continue to make efforts to get OOB she would discuss her functional deficits and why she cannot perform such task today. Pt was receptive to exercises and reports she has been asking for some, educated pt on UE/LE exercises for strengthening (see below). Pt husband present during session but not assistive in efforts for OOB mobility. OT to continue to progress pt as able, DC plans remain appropriate for SNF      If plan is discharge home, recommend the following:  A lot of help with bathing/dressing/bathroom;Two people to help with walking and/or transfers;Assistance with cooking/housework;Assist for transportation   Equipment Recommendations  Printmaker for Other Services      Precautions / Restrictions Precautions Precautions: Fall Precaution Comments: colostomy Restrictions Weight Bearing Restrictions: No        ADL either performed or assessed with clinical judgement   ADL       General ADL Comments: Pt self limiting, refused OOB moibility and stated "I still feel unwell from the oxy that they gave me yesterday" Pt and husband instructed on bed level theraband exercises    Extremity/Trunk Assessment Upper Extremity Assessment Upper Extremity Assessment: RUE deficits/detail RUE Deficits /  Details: past rotator injury RUE: Unable to fully assess due to pain;Shoulder pain with ROM             Cognition Arousal: Alert Behavior During Therapy: WFL for tasks assessed/performed Overall Cognitive Status: Within Functional Limits for tasks assessed                 General Comments: Pt with c/o feeling unwell, reports that the oxy she had the other day has limited her. Pt continues to discuss with OT that she cannot perform OOB mobility due to feeling unwell and then proceed to discuss her PSH and prior deficits. OT is very understanding of pt's past and functional deficits, after allowing multiple attempts to progress with mobility pt continued to decline OOB mobility. Pt displays a self limiting behavior, her husband was present and was empathetic towards the pt feeling unwell. Pt husband did not make efforts to encourage OOB mobility, was more understanding of his wife's wishes instead        Exercises General Exercises - Upper Extremity Shoulder Flexion: AROM, Strengthening, Left, Theraband, Supine Theraband Level (Shoulder Flexion): Level 1 (Yellow) Shoulder ABduction: AROM, Right, 5 reps, Supine, Other (comment) (isometrics) Shoulder ADduction: AROM, Right, 5 reps, Supine, Other (comment) (isometrics) Elbow Flexion: AROM, Left, Strengthening, 10 reps, Theraband, Supine Theraband Level (Elbow Flexion): Level 1 (Yellow) Elbow Extension: AROM, Left, Theraband, 10 reps, Supine Theraband Level (Elbow Extension): Level 1 (Yellow) General Exercises - Lower Extremity Short Arc Quad: Both, 10 reps, Supine, AROM Hip ABduction/ADduction: AROM, 10 reps, Other (comment), Supine (yellow theraband) Straight Leg Raises: AROM, Both, Supine, Other (comment), 10 reps (yellow therabands)  Shoulder Instructions       General Comments Pt husband present    Pertinent Vitals/ Pain       Pain Assessment Pain Assessment: Faces Faces Pain Scale: Hurts little more Pain Location:  generalized R leg, L knee, R shoulder, Pain Descriptors / Indicators: Discomfort, Aching Pain Intervention(s): Monitored during session, Repositioned   Frequency  Min 1X/week        Progress Toward Goals  OT Goals(current goals can now be found in the care plan section)  Progress towards OT goals: Not progressing toward goals - comment (self limiting behavior)  Acute Rehab OT Goals Patient Stated Goal: to return home OT Goal Formulation: With patient/family Time For Goal Achievement: 03/26/23 Potential to Achieve Goals: Fair   AM-PAC OT "6 Clicks" Daily Activity     Outcome Measure   Help from another person eating meals?: A Little Help from another person taking care of personal grooming?: A Little Help from another person toileting, which includes using toliet, bedpan, or urinal?: Total Help from another person bathing (including washing, rinsing, drying)?: Total Help from another person to put on and taking off regular upper body clothing?: A Little Help from another person to put on and taking off regular lower body clothing?: Total 6 Click Score: 12    End of Session    OT Visit Diagnosis: Unsteadiness on feet (R26.81);Muscle weakness (generalized) (M62.81);Other symptoms and signs involving cognitive function   Activity Tolerance Patient limited by pain   Patient Left in bed;with call bell/phone within reach;with family/visitor present   Nurse Communication Mobility status        Time: 6295-2841 OT Time Calculation (min): 28 min  Charges: OT General Charges $OT Visit: 1 Visit OT Treatments $Therapeutic Exercise: 23-37 mins  03/16/2023  AB, OTR/L  Acute Rehabilitation Services  Office: [phone]   Tristan Schroeder 03/16/2023, 4:40 PM

## 2023-03-17 DIAGNOSIS — N179 Acute kidney failure, unspecified: Secondary | ICD-10-CM | POA: Diagnosis not present

## 2023-03-17 LAB — COMPREHENSIVE METABOLIC PANEL
ALT: 11 U/L (ref 0–44)
AST: 12 U/L — ABNORMAL LOW (ref 15–41)
Albumin: 1.6 g/dL — ABNORMAL LOW (ref 3.5–5.0)
Alkaline Phosphatase: 64 U/L (ref 38–126)
Anion gap: 10 (ref 5–15)
BUN: 8 mg/dL (ref 8–23)
CO2: 27 mmol/L (ref 22–32)
Calcium: 7.4 mg/dL — ABNORMAL LOW (ref 8.9–10.3)
Chloride: 93 mmol/L — ABNORMAL LOW (ref 98–111)
Creatinine, Ser: 0.76 mg/dL (ref 0.44–1.00)
GFR, Estimated: >60 mL/min (ref >60)
Glucose, Bld: 117 mg/dL — ABNORMAL HIGH (ref 70–99)
Potassium: 2.8 mmol/L — ABNORMAL LOW (ref 3.5–5.1)
Sodium: 130 mmol/L — ABNORMAL LOW (ref 135–145)
Total Bilirubin: 0.7 mg/dL (ref 0.3–1.2)
Total Protein: 4.6 g/dL — ABNORMAL LOW (ref 6.5–8.1)

## 2023-03-17 LAB — CBC WITH DIFFERENTIAL/PLATELET
Abs Immature Granulocytes: 0.14 10*3/uL — ABNORMAL HIGH (ref 0.00–0.07)
Basophils Absolute: 0 10*3/uL (ref 0.0–0.1)
Basophils Relative: 0 %
Eosinophils Absolute: 0.5 10*3/uL (ref 0.0–0.5)
Eosinophils Relative: 5 %
HCT: 24.2 % — ABNORMAL LOW (ref 36.0–46.0)
Hemoglobin: 7.7 g/dL — ABNORMAL LOW (ref 12.0–15.0)
Immature Granulocytes: 2 %
Lymphocytes Relative: 14 %
Lymphs Abs: 1.3 10*3/uL (ref 0.7–4.0)
MCH: 29.3 pg (ref 26.0–34.0)
MCHC: 31.8 g/dL (ref 30.0–36.0)
MCV: 92 fL (ref 80.0–100.0)
Monocytes Absolute: 1 10*3/uL (ref 0.1–1.0)
Monocytes Relative: 11 %
Neutro Abs: 6.3 10*3/uL (ref 1.7–7.7)
Neutrophils Relative %: 68 %
Platelets: 282 10*3/uL (ref 150–400)
RBC: 2.63 MIL/uL — ABNORMAL LOW (ref 3.87–5.11)
RDW: 17.2 % — ABNORMAL HIGH (ref 11.5–15.5)
WBC: 9.2 10*3/uL (ref 4.0–10.5)
nRBC: 0 % (ref 0.0–0.2)

## 2023-03-17 LAB — SURGICAL PATHOLOGY

## 2023-03-17 LAB — MAGNESIUM: Magnesium: 1.3 mg/dL — ABNORMAL LOW (ref 1.7–2.4)

## 2023-03-17 LAB — PHOSPHORUS: Phosphorus: 3.2 mg/dL (ref 2.5–4.6)

## 2023-03-17 MED ORDER — MAGNESIUM SULFATE 4 GM/100ML IV SOLN
4.0000 g | Freq: Once | INTRAVENOUS | Status: AC
  Administered 2023-03-17: 4 g via INTRAVENOUS
  Filled 2023-03-17: qty 100

## 2023-03-17 MED ORDER — POTASSIUM CHLORIDE CRYS ER 20 MEQ PO TBCR: 40.0000 meq | EXTENDED_RELEASE_TABLET | Freq: Once | ORAL | Status: DC

## 2023-03-17 MED ORDER — ALBUMIN HUMAN 25 % IV SOLN
25.0000 g | Freq: Once | INTRAVENOUS | Status: AC
  Administered 2023-03-17: 25 g via INTRAVENOUS
  Filled 2023-03-17: qty 100

## 2023-03-17 MED ORDER — SODIUM CHLORIDE 0.9 % IV SOLN: INTRAVENOUS | Status: DC | PRN

## 2023-03-17 MED ORDER — POTASSIUM CHLORIDE 10 MEQ/100ML IV SOLN
10.0000 meq | INTRAVENOUS | Status: AC
  Administered 2023-03-17 – 2023-03-18 (×5): 10 meq via INTRAVENOUS
  Filled 2023-03-17 (×5): qty 100

## 2023-03-17 NOTE — Progress Notes (Signed)
    Durable Medical Equipment  (From admission, onward)           Start     Ordered   03/17/23 1648  For home use only DME Other see comment  Once       Comments: Hoyer lift. Pt is on a decline with ability to complete sit to stands, not able to ambulate more than 3 steps with therapy team and would need increased support with transfers. Pt only assist is her husband but pt needs +2 to safely transfer OOB without DME (hoyer).  Question:  Length of Need  Answer:  12 Months   03/17/23 1649   03/11/23 1609  For home use only DME Hospital bed  Once       Question Answer Comment  Length of Need Lifetime   The above medical condition requires: Patient requires the ability to reposition frequently   Head must be elevated greater than: 30 degrees   Bed type Semi-electric   Hoyer Lift Yes      03/11/23 1609

## 2023-03-17 NOTE — Progress Notes (Signed)
Patient refused to take her potassium pill,offered to have it change to liquid but refused it too. Explained the purpose of med but patient states " I'll address it tomorrow." Crosley,MD made aware. Order received.

## 2023-03-17 NOTE — Progress Notes (Signed)
Occupational Therapy Treatment Patient Details Name: Cindy Holmes MRN: [id number] DOB: Jan 04, 1951 Today's Date: 03/17/2023   History of present illness Pt is 72 yo presenting with persistent diarrhea, nausea and vomiting. Ex-lap with sigmoid colectomy and small bowel resection on 8/27. PMH: HTN, RA, CAD chronic HFpEF and hx CVA. Pt was recently discharged from hospital to home for similar issue.   OT comments  Pt progressed to standing today with min A +2, she continues to be fearful of her legs giving out despite having assist and only took 3 steps before requesting return to sitting. Discussed with Pt and spouse HH services and creating goals to reach upon DC home, discussed with pt spouse how HHOT can recommended or create strategies to make home more accessible and engage in ADLs still a possibility with pt using lift to get in/out of wheelchair. Husband does not display signs of caregiver fatigue at this time but may benefit from additional resources and support in the future. OT to continue to follow pt acutely to help progress home. Patient would benefit from post acute Home OT services to help maximize functional independence in natural environment       If plan is discharge home, recommend the following:  A lot of help with bathing/dressing/bathroom;Two people to help with walking and/or transfers;Assistance with cooking/housework;Assist for transportation   Equipment Recommendations  Hoyer lift;BSC/3in1 (bariactric [facility])    Recommendations for Other Services      Precautions / Restrictions Precautions Precautions: Fall Precaution Comments: colostomy Restrictions Weight Bearing Restrictions: No       Mobility Bed Mobility               General bed mobility comments: Pt was recieved in recliner and returned to recliner on request at end of session.    Transfers Overall transfer level: Needs assistance Equipment used: Rolling walker (2 wheels) Transfers: Sit  to/from Stand Sit to Stand: Min assist, +2 physical assistance           General transfer comment: Pt stood from recliner initial attempt pt demonstrates no contraction at bil quads with hands not assisting with lifting trunk and no forward lean. Pt stated " I can't do it." Pt was encouraged to attempt again and pt had good quad contraction used bil UE to assist and performed anterior trunk progression standing a Min A. +2 present due to pt prior reports of difficulty with standing. At initial evaluation pt was CGA. Pt took 3 steps forward/back stating that she must sit down before her legs give out     Balance Overall balance assessment: Needs assistance Sitting-balance support: No upper extremity supported, Bilateral upper extremity supported Sitting balance-Leahy Scale: Good Sitting balance - Comments: in recliner   Standing balance support: Reliant on assistive device for balance, Bilateral upper extremity supported, Single extremity supported Standing balance-Leahy Scale: Fair Standing balance comment: No overt LOB able to stand without physical assistance.                           ADL either performed or assessed with clinical judgement   ADL                                         General ADL Comments: Focused session on communicating with pt and family the benefits of HH services, and made efforts with PT to  progress OOB mobility and determine rehab potential      Cognition Arousal: Alert Behavior During Therapy: WFL for tasks assessed/performed Overall Cognitive Status: Within Functional Limits for tasks assessed                            General Comments Initally pt was reporting that she wanted to get stronger and perform transfers herself. Later pt stated she wanted to use hoyer lift to transfer to tub bench. Discussed concerns and issues with trying to use lift to get to tub bench with spouse. When conversation turned toward  caregiver well-being pt was no longer interested and became very resistant to help at home stating that there are always barriers put in place to get her assistance. Pt was assured this was not the case and stated that it is a concern for her spouse who is very willing to help at home that he may get injured if he does unsafe activities or tries picking pt up off the floor. Discussed calling EMS or fire dept if pt falls in the future rather than pt spouse and son getting pt up off the floor. Pt became dismissive as spouse education was performed; spouse was receptive to education.    Pertinent Vitals/ Pain       Pain Assessment Pain Assessment: Faces Faces Pain Scale: Hurts a little bit Pain Location: bil knees Pain Descriptors / Indicators: Discomfort, Aching Pain Intervention(s): Monitored during session, Repositioned  Home Living                                          Prior Functioning/Environment              Frequency  Min 1X/week        Progress Toward Goals  OT Goals(current goals can now be found in the care plan section)  Progress towards OT goals: Progressing toward goals  Acute Rehab OT Goals Patient Stated Goal: To return home OT Goal Formulation: With patient/family Time For Goal Achievement: 03/26/23 Potential to Achieve Goals: Fair  Plan      Co-evaluation    PT/OT/SLP Co-Evaluation/Treatment: Yes Reason for Co-Treatment: Necessary to address cognition/behavior during functional activity;Other (comment) (Pt has been refusing out of bed mobility. Co-tx today to determine if pt needs to be discharged from skilled therapy services. Combined education for pt and spouse) PT goals addressed during session: Mobility/safety with mobility;Proper use of DME;Other (comment) OT goals addressed during session: ADL's and self-care;Proper use of [employer] equipment and DME      AM-PAC OT "6 Clicks" Daily Activity     Outcome Measure   Help from  another person eating meals?: A Little Help from another person taking care of personal grooming?: A Little Help from another person toileting, which includes using toliet, bedpan, or urinal?: Total Help from another person bathing (including washing, rinsing, drying)?: Total Help from another person to put on and taking off regular upper body clothing?: A Little Help from another person to put on and taking off regular lower body clothing?: Total 6 Click Score: 12    End of Session Equipment Utilized During Treatment: Gait belt;Rolling walker (2 wheels)  OT Visit Diagnosis: Unsteadiness on feet (R26.81);Muscle weakness (generalized) (M62.81);Other symptoms and signs involving cognitive function   Activity Tolerance Patient tolerated treatment well   Patient Left in chair;with  call bell/phone within reach;with family/visitor present   Nurse Communication Mobility status        Time: 1304-1330 OT Time Calculation (min): 26 min  Charges: OT General Charges $OT Visit: 1 Visit OT Treatments $Therapeutic Activity: 8-22 mins  03/17/2023  AB, OTR/L  Acute Rehabilitation Services  Office: [phone]   Tristan Schroeder 03/17/2023, 2:59 PM

## 2023-03-17 NOTE — TOC Progression Note (Signed)
Transition of Care [facility]) - Progression Note    Patient Details  Name: Cindy Holmes MRN: [id number] Date of Birth: 12-30-1950  [facility] CM/SW Contact  Tom-Johnson, Hershal Coria, RN Phone Number: 03/17/2023, 4:50 PM  Clinical Narrative:     Bariatric BSC recommended, patient states she had a regular size BSC at home from [employer]. CM called and spoke with Earna Coder and he states patient received a BSC in 11/19 and not eligible for another till November this year. CM informed patient that she will have to ay out of pocket, patient declined at this time.   CM will continue to follow as patient progresses with care towards discharge.      Expected Discharge Plan: Home w Home Health Services Barriers to Discharge: Continued Medical Work up  Expected Discharge Plan and Services                         DME Arranged: Hospital bed [facility] lift) DME Agency: [employer] Date DME Agency Contacted: 03/11/23 Time DME Agency Contacted: [phone] Representative spoke with at DME Agency: Earna Coder HH Arranged: Refused HH HH Agency: NA         Social Determinants of Health (SDOH) Interventions SDOH Screenings   Food Insecurity: No Food Insecurity (03/05/2023)  Housing: Low Risk  (03/05/2023)  Transportation Needs: No Transportation Needs (03/05/2023)  Utilities: Not At Risk (03/05/2023)  Tobacco Use: Low Risk  (03/08/2023)    Readmission Risk Interventions    03/03/2023    3:06 PM 02/03/2023    3:58 PM  Readmission Risk Prevention Plan  Transportation Screening Complete Complete  PCP or Specialist Appt within 3-5 Days Complete Complete  HRI or Home Care Consult Complete Complete  Social Work Consult for Recovery Care Planning/Counseling Complete Complete  Palliative Care Screening Not Applicable Not Applicable  Medication Review [employer]) Referral to Pharmacy Referral to Pharmacy

## 2023-03-17 NOTE — Progress Notes (Signed)
PROGRESS NOTE    Cindy Holmes  TKZ:[id number] DOB: [birth year] DOA: 03/02/2023 PCP: [name], MD  Chief Complaint  Patient presents with   Emesis   Diarrhea    Brief Narrative:   72 year old with hypertension, rheumatoid arthritis on immunosuppressive's, coronary artery disease, chronic diastolic heart failure, history of stroke presented to the ER with persistent diarrhea, nausea and vomiting.  Underwent colonoscopy and found to have sigmoid colon stenosis.  Surgery consulted.  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 Remains in the hospital, recovering from surgery.  Difficult mobility and poor participation.  Assessment & Plan:   Principal Problem:   AKI (acute kidney injury) (HCC) Active Problems:   Stricture of sigmoid colon (HCC)   Diarrhea   Essential hypertension   Hyperlipidemia   Rheumatoid arthritis (HCC)   PAF (paroxysmal atrial fibrillation) (HCC)   Morbid obesity with body mass index (BMI) of 40.0 to 49.9 (HCC)   Immunosuppression due to drug therapy [facility])   Wheelchair dependent  Sigmoid stricture, overflow diarrhea with recent history of enteropathogenic E. coli: Colonoscopy with Danie Hannig strictured benign S/p sigmoid colectomy with Hartman's procedure and end colostomy, small bowel resection  Tolerating regular diet. Pain management with oral pain medications.   Acute kidney injury: multifactorial.  Renal function stable.  Tolerating torsemide.   Paroxysmal Durwin Davisson-fib: Rate controlled.  On metoprolol.  Eliquis resumed.  Hemoglobin stable.      Rheumatoid arthritis: On methotrexate and Plaquenil on hold for procedure and wound healing.  Will resume on discharge.   Hyperlipidemia: On statin.   Acute on chronic diastolic heart failure with pulmonary hypertension, bilateral leg edema, vascular congestion: Echocardiogram with ejection fraction of 70%.  Severely elevated pulmonary artery systolic pressures.  Cardiology has seen patient preop (last note appears  to be 8/30).  Back on torsemide.  Scheduled potassium. On 8/30, noted to have "moderate leg edema" in cards note, suspect this is similar to today's exam Awaiting weights, she's net negative 1.3 L Continue torsemide BID Will give dose of albumin with severe hypoalbuminemia   Anemia of chronic disease: Hemoglobin stable at around 8-9.  Will continue to monitor.   Hypokalemia: replace and follow    Poor circulation left leg: Has Doppler pulses per my partner's evaluation.  ABI with noncompresssible LLE arternies, reduced L toe brachial index (noncompressible RLE arteries, normal R toe brachial index).  Would benefit from outpatient vascular follow up.   Continue to work with PT OT.  Discontinue foley.  Poorly motivated to mobilize.  Doesn't sound like she's interested in SNF, will see how she does with therapy.    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 Left ventricular ejection fraction, by estimation, is 65 to 70%. The  left ventricle has normal function. The left ventricle has no regional  wall motion abnormalities. Left ventricular diastolic parameters are  consistent with Grade II diastolic  dysfunction (pseudonormalization).   2. Right ventricular systolic function is normal. The right ventricular  size is dilated. There is severely elevated pulmonary artery systolic  pressure. The estimated right ventricular systolic pressure is 61.5 mmHg.   3. Left atrial size was mildly dilated.   4. The mitral valve is normal in structure. Mild to  moderate mitral valve  regurgitation.   5. Multiple jets. Tricuspid valve regurgitation is mild to moderate.    6. The aortic valve is tricuspid. Aortic valve regurgitation is not  visualized. No aortic stenosis is present.   7. The inferior vena cava is dilated in size with <50% respiratory  variability, suggesting right atrial pressure of 15 mmHg.   Comparison(s): Prior images reviewed side by side. RV is dilated compared  to prior on 4 chamber live views. RVSP has increased.   Antimicrobials:  Anti-infectives (From admission, onward)    Start     Dose/Rate Route Frequency Ordered Stop   03/08/23 1204  sodium chloride 0.9 % with cefoTEtan (CEFOTAN) ADS Med       Note to Pharmacy: Susy [place] L: cabinet override      03/08/23 1204 03/08/23 1247   03/07/23 0600  cefoTEtan (CEFOTAN) 2 g in sodium chloride 0.9 % 100 mL IVPB  Status:  Discontinued        2 g 200 mL/hr over 30 Minutes Intravenous On call to O.R. 03/06/23 1037 03/08/23 0559   03/06/23 1400  neomycin (MYCIFRADIN) tablet 1,000 mg       Placed in "And" Linked Group   1,000 mg Oral 3 times per day 03/06/23 1037 03/06/23 2243   03/06/23 1400  metroNIDAZOLE (FLAGYL) tablet 1,000 mg       Placed in "And" Linked Group   1,000 mg Oral 3 times per day 03/06/23 1037 03/07/23 1359   03/02/23 2000  azithromycin (ZITHROMAX) 500 mg in sodium chloride 0.9 % 250 mL IVPB  Status:  Discontinued        500 mg 250 mL/hr over 60 Minutes Intravenous Every 24 hours 03/02/23 1959 03/04/23 1745       Subjective: No new complaints Notes swelling  Objective: Vitals:   03/16/23 1658 03/16/23 2056 03/17/23 0504 03/17/23 0836  BP: (!) 97/52 (!) 106/44 (!) 102/44 (!) 115/44  Pulse: 90 (!) 50 99 (!) 105  Resp: 18 17 17 19   Temp: 98.4 F (36.9 C) 99.1 F (37.3 C) 98.2 F (36.8 C) 98.3 F (36.8 C)  TempSrc:  Oral Oral Oral  SpO2: 97% 94% 97% 94%  Weight:      Height:        Intake/Output Summary (Last 24 hours) at 03/17/2023 1454 Last data filed at 03/17/2023 1247 Gross per 24 hour  Intake 600 ml  Output 1900 ml  Net -1300 ml   Filed  Weights   03/02/23 1115 03/08/23 1018 03/13/23 0500  Weight: 104.3 kg 99.8 kg 101 kg    Examination:  General: No acute distress. Cardiovascular: RRR Lungs: unlabored Neurological: Alert and oriented 3. Moves all extremities 4. Cranial nerves II through XII grossly intact. Skin: Warm and dry. No rashes or lesions. Extremities: bilateral LE edema    Data Reviewed: I have personally reviewed following labs and imaging studies  CBC: Recent Labs  Lab 03/13/23 0447 03/14/23 0456 03/17/23 1005  WBC 8.2 8.5 9.2  NEUTROABS  --   --  6.3  HGB 7.8* 8.2* 7.7*  HCT 24.8* 25.4* 24.2*  MCV 95.0 94.1 92.0  PLT 272 292 282    Basic Metabolic Panel: Recent Labs  Lab 03/12/23 0633 03/13/23 0447 03/15/23 0606 03/17/23 1005  NA 133* 134* 134* 130*  K 3.4* 3.4* 3.4* 2.8*  CL 104 105 98 93*  CO2 21* 22 23 27   GLUCOSE 95 100* 118* 117*  BUN 18 17 11 8   CREATININE  1.25* 0.97 0.83 0.76  CALCIUM 7.9* 8.0* 8.3* 7.4*  MG  --   --   --  1.3*  PHOS  --   --   --  3.2    GFR: Estimated Creatinine Clearance: 73.1 mL/min (by C-G formula based on SCr of 0.76 mg/dL).  Liver Function Tests: Recent Labs  Lab 03/17/23 1005  AST 12*  ALT 11  ALKPHOS 64  BILITOT 0.7  PROT 4.6*  ALBUMIN 1.6*    CBG: No results for input(s): "GLUCAP" in the last 168 hours.   No results found for this or any previous visit (from the past 240 hour(s)).        Radiology Studies: No results found.      Scheduled Meds:  acetaminophen  1,000 mg Oral Q6H   apixaban  5 mg Oral BID   Chlorhexidine Gluconate Cloth  6 each Topical Daily   docusate sodium  100 mg Oral BID   feeding supplement  237 mL Oral BID BM   metoprolol tartrate  25 mg Oral BID   potassium chloride  40 mEq Oral BID   torsemide  20 mg Oral BID   Continuous Infusions:   LOS: 15 days    Time spent: over 30 min    Lacretia Nicks, MD [facility]   To contact the attending provider between 7A-7P or the  covering provider during after hours 7P-7A, please log into the web site [email or web address] and access using universal [place] password for that web site. If you do not have the password, please call the hospital operator.  03/17/2023, 2:54 PM

## 2023-03-17 NOTE — Progress Notes (Signed)
Physical Therapy Treatment Patient Details Name: Cindy Holmes MRN: [id number] DOB: 03-Mar-1951 Today's Date: 03/17/2023   History of Present Illness Pt is 72 yo presenting with persistent diarrhea, nausea and vomiting. Ex-lap with sigmoid colectomy and small bowel resection on 8/27. PMH: HTN, RA, CAD chronic HFpEF and hx CVA. Pt was recently discharged from hospital to home for similar issue.    PT Comments  Pt currently is not progressing towards goals. Physical therapy has attempted to see pt 7 times prior to today. Pt has refused 2x and refused out of bed mobility 4x. Today is the 8th visit and pt was assessed for appropriateness of continued skilled physical therapy services due to pt has not been participating in skilled activities. Pt and spouse were educated on equipment for home needed at pt current level of function and extra time spent educating spouse on his safety in order to decrease risk for injuries related to pt care. Discussed role of physical therapy in the home. Pt was able to stand on second attempt at Min A today with a second person for safety; initial attempt no trunk flexion, no assistance with UE and no quad contraction.  Currently recommending skilled physical therapy services 3x/weekly on discharge from acute care hospital setting for family education in home set up and to decrease burden of care on caregiver. Pt spouse was receptive to education.   If plan is discharge home, recommend the following: A little help with walking and/or transfers;Assistance with cooking/housework;Assist for transportation     Equipment Recommendations  [place] lift;Hospital bed;Other (comment) (pressure relief cushions for W/C)       Precautions / Restrictions Precautions Precautions: Fall Precaution Comments: colostomy Restrictions Weight Bearing Restrictions: No     Mobility  Bed Mobility       General bed mobility comments: Pt was recieved in recliner and returned to recliner on  request at end of session.    Transfers Overall transfer level: Needs assistance Equipment used: Rolling walker (2 wheels) Transfers: Sit to/from Stand Sit to Stand: Min assist, +2 physical assistance           General transfer comment: Pt stood from recliner initial attempt pt demonstrates no contraction at bil quads with hands not assisting with lifting trunk and no forward lean. Pt stated " I can't do it." Pt was encouraged to attempt again and pt had good quad contraction used bil UE to assist and performed anterior trunk progression standing a Min A. +2 present due to pt prior reports of difficulty with standing. At initial evaluation pt was CGA.    Ambulation/Gait       Gait Pattern/deviations: Decreased step length - right, Decreased step length - left       General Gait Details: Pt took 3 steps from recliner then stated she could no longer take steps and backed up sitting down on the recliner. Pt uses L hand on RW and R forearm; similar to previous short distance ambulation to [facility] on initial evaluation.       Balance Overall balance assessment: Needs assistance Sitting-balance support: No upper extremity supported, Bilateral upper extremity supported Sitting balance-Leahy Scale: Good Sitting balance - Comments: Sitting EOB no LOB   Standing balance support: Reliant on assistive device for balance, Bilateral upper extremity supported, Single extremity supported Standing balance-Leahy Scale: Fair Standing balance comment: No overt LOB able to stand without physical assistance.        Cognition Arousal: Alert Behavior During Therapy: [name] for tasks assessed/performed  Overall Cognitive Status: Within Functional Limits for tasks assessed          General Comments General comments (skin integrity, edema, etc.): Initally pt was reporting that she wanted to get stronger and perform transfers herself. Later pt stated she wanted to use hoyer lift to transfer to tub  bench. Discussed concerns and issues with trying to use lift to get to tub bench with spouse. When conversation turned toward caregiver well-being pt was no longer interested and became very resistant to help at home stating that there are always barriers put in place to get her assistance. Pt was assured this was not the case and stated that it is a concern for her spouse who is very willing to help at home that he may get injured if he does unsafe activities or tries picking pt up off the floor. Discussed calling EMS or fire dept if pt falls in the future rather than pt spouse and son getting pt up off the floor. Pt became dismissive as spouse education was performed; spouse was receptive to education.      Pertinent Vitals/Pain Pain Assessment Pain Assessment: Faces Faces Pain Scale: Hurts a little bit Pain Location: bil knees Pain Descriptors / Indicators: Discomfort Pain Intervention(s): Monitored during session     PT Goals (current goals can now be found in the care plan section) Acute Rehab PT Goals Patient Stated Goal: Less pain and moving around better so I can go home. PT Goal Formulation: With patient/family Time For Goal Achievement: 03/23/23 Potential to Achieve Goals: Poor Progress towards PT goals: Not progressing toward goals - comment (pt is self limiting. Pt is at previous level of function on initial evaluation.)    Frequency    Min 1X/week      PT Plan  Updated discharge recommendations    Co-evaluation PT/OT/SLP Co-Evaluation/Treatment: Yes Reason for Co-Treatment: Necessary to address cognition/behavior during functional activity;Other (comment) (Pt has been refusing out of bed mobility. Co-tx today to determine if pt needs to be discharged from skilled therapy services. Combined education for pt and spouse) PT goals addressed during session: Mobility/safety with mobility;Proper use of DME;Other (comment) (education see assessment)        AM-PAC PT "6  Clicks" Mobility   Outcome Measure  Help needed turning from your back to your side while in a flat bed without using bedrails?: A Little Help needed moving from lying on your back to sitting on the side of a flat bed without using bedrails?: A Little Help needed moving to and from a bed to a chair (including a wheelchair)?: A Little Help needed standing up from a chair using your arms (e.g., wheelchair or bedside chair)?: A Little Help needed to walk in hospital room?: A Lot Help needed climbing 3-5 steps with a railing? : Total 6 Click Score: 15    End of Session Equipment Utilized During Treatment: Gait belt Activity Tolerance: Other (comment) (Pt is self limiting) Patient left: in chair;with call bell/phone within reach;with family/visitor present Nurse Communication: Mobility status PT Visit Diagnosis: History of falling (Z91.81);Muscle weakness (generalized) (M62.81)     Time: 1610-9604 PT Time Calculation (min) (ACUTE ONLY): 32 min  Charges:    $Therapeutic Activity: 8-22 mins PT General Charges $$ ACUTE PT VISIT: 1 Visit                     Harrel Carina, DPT, CLT  Acute Rehabilitation Services Office: [phone] (Secure chat preferred)    Santina Evans  Pandora Leiter 03/17/2023, 2:00 PM

## 2023-03-18 DIAGNOSIS — N179 Acute kidney failure, unspecified: Secondary | ICD-10-CM | POA: Diagnosis not present

## 2023-03-18 LAB — CBC WITH DIFFERENTIAL/PLATELET
Abs Immature Granulocytes: 0.09 10*3/uL — ABNORMAL HIGH (ref 0.00–0.07)
Basophils Absolute: 0 10*3/uL (ref 0.0–0.1)
Basophils Relative: 0 %
Eosinophils Absolute: 0.4 10*3/uL (ref 0.0–0.5)
Eosinophils Relative: 5 %
HCT: 23.9 % — ABNORMAL LOW (ref 36.0–46.0)
Hemoglobin: 7.7 g/dL — ABNORMAL LOW (ref 12.0–15.0)
Immature Granulocytes: 1 %
Lymphocytes Relative: 12 %
Lymphs Abs: 1.1 10*3/uL (ref 0.7–4.0)
MCH: 29.2 pg (ref 26.0–34.0)
MCHC: 32.2 g/dL (ref 30.0–36.0)
MCV: 90.5 fL (ref 80.0–100.0)
Monocytes Absolute: 0.8 10*3/uL (ref 0.1–1.0)
Monocytes Relative: 9 %
Neutro Abs: 7 10*3/uL (ref 1.7–7.7)
Neutrophils Relative %: 73 %
Platelets: 290 10*3/uL (ref 150–400)
RBC: 2.64 MIL/uL — ABNORMAL LOW (ref 3.87–5.11)
RDW: 16.8 % — ABNORMAL HIGH (ref 11.5–15.5)
WBC: 9.5 10*3/uL (ref 4.0–10.5)
nRBC: 0 % (ref 0.0–0.2)

## 2023-03-18 LAB — COMPREHENSIVE METABOLIC PANEL
ALT: 13 U/L (ref 0–44)
AST: 14 U/L — ABNORMAL LOW (ref 15–41)
Albumin: 2.1 g/dL — ABNORMAL LOW (ref 3.5–5.0)
Alkaline Phosphatase: 61 U/L (ref 38–126)
Anion gap: 13 (ref 5–15)
BUN: 7 mg/dL — ABNORMAL LOW (ref 8–23)
CO2: 24 mmol/L (ref 22–32)
Calcium: 7.6 mg/dL — ABNORMAL LOW (ref 8.9–10.3)
Chloride: 91 mmol/L — ABNORMAL LOW (ref 98–111)
Creatinine, Ser: 0.76 mg/dL (ref 0.44–1.00)
GFR, Estimated: >60 mL/min (ref >60)
Glucose, Bld: 107 mg/dL — ABNORMAL HIGH (ref 70–99)
Potassium: 3.4 mmol/L — ABNORMAL LOW (ref 3.5–5.1)
Sodium: 128 mmol/L — ABNORMAL LOW (ref 135–145)
Total Bilirubin: 0.6 mg/dL (ref 0.3–1.2)
Total Protein: 4.9 g/dL — ABNORMAL LOW (ref 6.5–8.1)

## 2023-03-18 LAB — MAGNESIUM: Magnesium: 1.9 mg/dL (ref 1.7–2.4)

## 2023-03-18 LAB — PHOSPHORUS: Phosphorus: 2.9 mg/dL (ref 2.5–4.6)

## 2023-03-18 MED ORDER — METOPROLOL TARTRATE 12.5 MG HALF TABLET
12.5000 mg | ORAL_TABLET | Freq: Two times a day (BID) | ORAL | Status: DC
  Administered 2023-03-18 – 2023-03-21 (×6): 12.5 mg via ORAL
  Filled 2023-03-18 (×6): qty 1

## 2023-03-18 MED ORDER — FUROSEMIDE 10 MG/ML IJ SOLN
40.0000 mg | Freq: Two times a day (BID) | INTRAMUSCULAR | Status: DC
  Administered 2023-03-18 – 2023-03-21 (×7): 40 mg via INTRAVENOUS
  Filled 2023-03-18 (×7): qty 4

## 2023-03-18 MED ORDER — POTASSIUM CHLORIDE CRYS ER 10 MEQ PO TBCR
40.0000 meq | EXTENDED_RELEASE_TABLET | Freq: Once | ORAL | Status: AC
  Administered 2023-03-18: 40 meq via ORAL

## 2023-03-18 MED ORDER — ALBUMIN HUMAN 25 % IV SOLN
25.0000 g | Freq: Once | INTRAVENOUS | Status: AC
  Administered 2023-03-18: 12.5 g via INTRAVENOUS
  Filled 2023-03-18: qty 100

## 2023-03-18 NOTE — Progress Notes (Signed)
Physical Therapy Treatment Patient Details Name: Cindy Holmes MRN: [id number] DOB: 05/01/1951 Today's Date: 03/18/2023   History of Present Illness Pt is 72 yo presenting with persistent diarrhea, nausea and vomiting. Ex-lap with sigmoid colectomy and small bowel resection on 8/27. Persistent nausea/dizziness limiting post-op mobility progression. PMH: HTN, RA, CAD chronic HFpEF and hx CVA. Pt was recently discharged from hospital to home for similar issue.    PT Comments  Pt received in supine, agreeable to therapy session with max encouragement, pt c/o symptoms she has had today since working with nursing and therapies on past couple days, able to be convinced to work on transfers after discussion on risks of immobility and need to progress strength for discharge home instead of SNF as she requests. Pt needing up to modA for bed mobility with assist mostly from spouse and PTA with teachback on safer technique given abdominal surgery and for spouse for improved body mechanics. Pt needing up to +1 modA/light +2 minA for sit<>stand from EOB and minA for sidesteps toward [facility] ~59ft to simulate step pivot transfer. Pt defers transfer OOB to [facility] or to recliner chair. Pt continues to benefit from PT services to progress toward functional mobility goals, pt seems to have plateaued past two sessions, would benefit from trial of knee-high TED hose to see if this helps seated/standing tolerance (alos noted to still have significant BLE and pedal edema -per pt she is on lasix.        03/18/23 1430  Vital Signs  Patient Position (if appropriate) Orthostatic Vitals  Orthostatic Sitting  BP- Sitting (!) 144/93  Pulse- Sitting 86   Orthostatic Sitting  BP- Sitting 113/62 (after STS from bedside, pt c/o nausea)  BP - Supine  118/58  Pulse - Supine 87            If plan is discharge home, recommend the following: A little help with walking and/or transfers;Assistance with cooking/housework;Assist for  transportation   Can travel by private vehicle        Equipment Recommendations  [place] lift;Hospital bed;Other (comment) (pressure relief cushions for W/C)    Recommendations for Other Services       Precautions / Restrictions Precautions Precautions: Fall Precaution Comments: colostomy, orthostatic hypotension Restrictions Weight Bearing Restrictions: No     Mobility  Bed Mobility Overal bed mobility: Needs Assistance Bed Mobility: Rolling, Sidelying to Sit, Sit to Sidelying Rolling: Mod assist, Used rails Sidelying to sit: Mod assist, Used rails, HOB elevated     Sit to sidelying: Mod assist, Used rails General bed mobility comments: cues for log roll and simple 1-step commands to reduce complexity of technique and for abdominal protection to sit up on L EOB. Pt benefits from multimodal cues for use of BLE to assist with rolling and to use rail to assist with rolling and pushing up away from [facility].    Transfers Overall transfer level: Needs assistance Equipment used: Rolling walker (2 wheels) Transfers: Sit to/from Stand, Bed to chair/wheelchair/BSC Sit to Stand: Mod assist (+2 safety but only +1 lift assist)   Step pivot transfers: Min assist, +2 safety/equipment       General transfer comment: from EOB<>RW, pt stands with instruction on use of momentum and spouse able to assist to lift her with gait belt, PTA CGA for safety; mod cues for posture and step sequencing for pivotal steps ~46ft along EOB toward HOB. Pt self-limiting stanidng due to c/o nausea and "I feel like I might pass out". RN/MD notified  of pt orthostatic symptoms.    Ambulation/Gait               General Gait Details: Pt took ~4 steps along EOB then stated she could no longer take steps and backed up sitting down on the EOB. Pt uses L hand on RW and R forearm; similar to previous short distance ambulation to [facility] on initial evaluation.   Stairs             Wheelchair Mobility     Tilt  Bed    Modified Rankin (Stroke Patients Only)       Balance Overall balance assessment: Needs assistance Sitting-balance support: No upper extremity supported, Single extremity supported Sitting balance-Leahy Scale: Good Sitting balance - Comments: EOB   Standing balance support: Reliant on assistive device for balance, Bilateral upper extremity supported, During functional activity Standing balance-Leahy Scale: Fair Standing balance comment: moderate reliance on RW for support, self-limiting standing to ~2 minutes at a time due to c/o dizziness                            Cognition Arousal: Alert Behavior During Therapy: Lability Overall Cognitive Status: Within Functional Limits for tasks assessed Area of Impairment: Problem solving, Following commands, Safety/judgement, Awareness                       Following Commands: Follows one step commands with increased time Safety/Judgement: Decreased awareness of safety Awareness: Intellectual Problem Solving: Slow processing, Requires verbal cues, Decreased initiation, Requires tactile cues, Difficulty sequencing General Comments: PTA discussing plan for session at beginning (RN had also premedicated her prior to session with Tylenol and she was aware of session) and pt states "I need you to tell me what we are going to do" although this had just been discussed. Pt with self-limiting behaviors but able to be redirected to participate somewhat in session. Spouse present and receptive to all instruction on caregiver assist and for better body mechanics/ techniques so he does not hurt his back.        Exercises      General Comments General comments (skin integrity, edema, etc.): BP 144/93 seated EOB initially (pt slightly moving her arm, could be less accurate); BP 113/62 sitting EOB after sidesteps along EOB. HR 86 bpm sitting; BP 118/58 HR 87 bpm back to supine in bed chair posture      Pertinent Vitals/Pain  Pain Assessment Pain Assessment: Faces Faces Pain Scale: Hurts a little bit Pain Location: not localized, pt perseverating today on her c/o nausea Pain Descriptors / Indicators: Grimacing Pain Intervention(s): Limited activity within patient's tolerance, Monitored during session, Premedicated before session, Repositioned    Home Living                          Prior Function            PT Goals (current goals can now be found in the care plan section) Acute Rehab PT Goals Patient Stated Goal: Less pain and moving around better so I can go home. PT Goal Formulation: With patient/family Time For Goal Achievement: 03/23/23 Progress towards PT goals: Progressing toward goals (slowly, self-limiting)    Frequency    Min 1X/week      PT Plan      Co-evaluation              AM-PAC PT "6 Clicks"  Mobility   Outcome Measure  Help needed turning from your back to your side while in a flat bed without using bedrails?: A Little Help needed moving from lying on your back to sitting on the side of a flat bed without using bedrails?: A Lot Help needed moving to and from a bed to a chair (including a wheelchair)?: A Little Help needed standing up from a chair using your arms (e.g., wheelchair or bedside chair)?: A Lot Help needed to walk in hospital room?: Total Help needed climbing 3-5 steps with a railing? : Total 6 Click Score: 12    End of Session Equipment Utilized During Treatment: Gait belt Activity Tolerance: Patient tolerated treatment well;Treatment limited secondary to medical complications (Comment);Other (comment) (symptomatic drop in BP, self-limiting behaviors) Patient left: in bed;with call bell/phone within reach;with bed alarm set;with family/visitor present Nurse Communication: Mobility status;Need for lift equipment;Other (comment) (c/o nausea) PT Visit Diagnosis: History of falling (Z91.81);Muscle weakness (generalized) (M62.81)     Time:  4098-1191 PT Time Calculation (min) (ACUTE ONLY): 39 min  Charges:    $Therapeutic Activity: 38-52 mins PT General Charges $$ ACUTE PT VISIT: 1 Visit                     Alexy Bringle P., PTA Acute Rehabilitation Services Secure Chat Preferred 9a-5:30pm Office: [phone]    Dorathy Kinsman [facility] 03/18/2023, 5:36 PM

## 2023-03-18 NOTE — Progress Notes (Addendum)
PROGRESS NOTE    Cindy Holmes  ZOX:[id number] DOB: [birth year] DOA: 03/02/2023 PCP: [name], MD  Chief Complaint  Patient presents with   Emesis   Diarrhea    Brief Narrative:   72 year old with hypertension, rheumatoid arthritis on immunosuppressive's, coronary artery disease, chronic diastolic heart failure, history of stroke presented to the ER with persistent diarrhea, nausea and vomiting.  Underwent colonoscopy and found to have sigmoid colon stenosis.  Surgery consulted.  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 Remains in the hospital, recovering from surgery.  Difficult mobility and poor participation.  Assessment & Plan:   Principal Problem:   AKI (acute kidney injury) (HCC) Active Problems:   Stricture of sigmoid colon (HCC)   Diarrhea   Essential hypertension   Hyperlipidemia   Rheumatoid arthritis (HCC)   PAF (paroxysmal atrial fibrillation) (HCC)   Morbid obesity with body mass index (BMI) of 40.0 to 49.9 (HCC)   Immunosuppression due to drug therapy [facility])   Wheelchair dependent  Acute on chronic diastolic heart failure with pulmonary hypertension bilateral leg edema vascular congestion  Has continued LE edema (8/30 cards note states "moderate leg edema" - I suspect she hasn't changed much) Echocardiogram with ejection fraction of 70%.  Severely elevated pulmonary artery systolic pressures.  Cardiology has seen patient preop (last note appears to be 8/30).  Scheduled potassium. On 8/30, noted to have "moderate leg edema" in cards note, suspect this is similar to today's exam Will give IV lasix and follow volume status -> this should help with mobility  S/p albumin Weights significantly up (doubt accurate, but trending up over yesterday/today), net negative   Sigmoid stricture, overflow diarrhea with recent history of enteropathogenic E. coli: Colonoscopy with Hayes Rehfeldt strictured benign S/p sigmoid colectomy with Hartman's procedure and end colostomy,  small bowel resection  Tolerating regular diet. Pain management with oral pain medications.   Acute kidney injury: multifactorial.  Renal function stable.  Tolerating torsemide.   Paroxysmal Eithan Beagle-fib: Rate controlled.  On metoprolol.  Eliquis resumed.  Hemoglobin stable.      Rheumatoid arthritis: On methotrexate and Plaquenil on hold for procedure and wound healing.  Will resume on discharge.   Hyperlipidemia: On statin.   Anemia of chronic disease: Hemoglobin stable at around 8-9.  Will continue to monitor.   Hypokalemia: replace and follow    Poor circulation left leg: Has Doppler pulses per my partner's evaluation.  ABI with noncompresssible LLE arternies, reduced L toe brachial index (noncompressible RLE arteries, normal R toe brachial index).  Would benefit from outpatient vascular follow up.   Orthostasis Will continue to monitor, caution with diuresis (noted by therapy 9/6) Will decrease dose of metop  Continue to work with PT OT.  Discontinue foley.  Poorly motivated to mobilize.  Doesn't sound like she's interested in SNF, will see how she does with therapy.    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 Left ventricular ejection fraction, by estimation, is 65 to 70%. The  left ventricle has normal function. The left ventricle has no regional  wall motion abnormalities. Left ventricular diastolic parameters are  consistent with Grade II diastolic  dysfunction (pseudonormalization).   2. Right ventricular systolic function is normal. The right  ventricular  size is dilated. There is severely elevated pulmonary artery systolic  pressure. The  estimated right ventricular systolic pressure is 61.5 mmHg.   3. Left atrial size was mildly dilated.   4. The mitral valve is normal in structure. Mild to moderate mitral valve  regurgitation.   5. Multiple jets. Tricuspid valve regurgitation is mild to moderate.   6. The aortic valve is tricuspid. Aortic valve regurgitation is not  visualized. No aortic stenosis is present.   7. The inferior vena cava is dilated in size with <50% respiratory  variability, suggesting right atrial pressure of 15 mmHg.   Comparison(s): Prior images reviewed side by side. RV is dilated compared  to prior on 4 chamber live views. RVSP has increased.   Antimicrobials:  Anti-infectives (From admission, onward)    Start     Dose/Rate Route Frequency Ordered Stop   03/08/23 1204  sodium chloride 0.9 % with cefoTEtan (CEFOTAN) ADS Med       Note to Pharmacy: [place]y [place] L: cabinet override      03/08/23 1204 03/08/23 1247   03/07/23 0600  cefoTEtan (CEFOTAN) 2 g in sodium chloride 0.9 % 100 mL IVPB  Status:  Discontinued        2 g 200 mL/hr over 30 Minutes Intravenous On call to O.R. 03/06/23 1037 03/08/23 0559   03/06/23 1400  neomycin (MYCIFRADIN) tablet 1,000 mg       Placed in "And" Linked Group   1,000 mg Oral 3 times per day 03/06/23 1037 03/06/23 2243   03/06/23 1400  metroNIDAZOLE (FLAGYL) tablet 1,000 mg       Placed in "And" Linked Group   1,000 mg Oral 3 times per day 03/06/23 1037 03/07/23 1359   03/02/23 2000  azithromycin (ZITHROMAX) 500 mg in sodium chloride 0.9 % 250 mL IVPB  Status:  Discontinued        500 mg 250 mL/hr over 60 Minutes Intravenous Every 24 hours 03/02/23 1959 03/04/23 1745       Subjective: Tired, didn't sleep well last night  Objective: Vitals:   03/17/23 1641 03/17/23 2040 03/18/23 0632 03/18/23 0915  BP: (!) 109/47 (!) 102/41 (!) 101/51 (!) 114/49  Pulse: (!) 106 99 98 (!) 102  Resp: 18 18 18 18   Temp: 98.2 F (36.8 C) 98.5 F (36.9 C) 98.1 F (36.7 C)    TempSrc:  Oral Oral   SpO2: 98% 95% 100% 97%  Weight:   114.4 kg   Height:        Intake/Output Summary (Last 24 hours) at 03/18/2023 1509 Last data filed at 03/18/2023 0900 Gross per 24 hour  Intake 779.31 ml  Output 1200 ml  Net -420.69 ml   Filed Weights   03/13/23 0500 03/17/23 1500 03/18/23 0632  Weight: 101 kg 108.6 kg 114.4 kg    Examination:  General: No acute distress. Cardiovascular: RRR Lungs: unlabored Neurological: Alert and oriented 3. Moves all extremities 4 with equal strength. Cranial nerves II through XII grossly intact. Extremities: bilateral LE edema   Data Reviewed: I have personally reviewed following labs and imaging studies  CBC: Recent Labs  Lab 03/13/23 0447 03/14/23 0456 03/17/23 1005 03/18/23 0754  WBC 8.2 8.5 9.2 9.5  NEUTROABS  --   --  6.3 7.0  HGB 7.8* 8.2* 7.7* 7.7*  HCT 24.8* 25.4* 24.2* 23.9*  MCV 95.0 94.1 92.0 90.5  PLT 272 292 282 290    Basic Metabolic Panel: Recent Labs  Lab 03/12/23  0981 03/13/23 0447 03/15/23 0606 03/17/23 1005 03/18/23 0754  NA 133* 134* 134* 130* 128*  K 3.4* 3.4* 3.4* 2.8* 3.4*  CL 104 105 98 93* 91*  CO2 21* 22 23 27 24   GLUCOSE 95 100* 118* 117* 107*  BUN 18 17 11 8  7*  CREATININE 1.25* 0.97 0.83 0.76 0.76  CALCIUM 7.9* 8.0* 8.3* 7.4* 7.6*  MG  --   --   --  1.3* 1.9  PHOS  --   --   --  3.2 2.9    GFR: Estimated Creatinine Clearance: 78.6 mL/min (by C-G formula based on SCr of 0.76 mg/dL).  Liver Function Tests: Recent Labs  Lab 03/17/23 1005 03/18/23 0754  AST 12* 14*  ALT 11 13  ALKPHOS 64 61  BILITOT 0.7 0.6  PROT 4.6* 4.9*  ALBUMIN 1.6* 2.1*    CBG: No results for input(s): "GLUCAP" in the last 168 hours.   No results found for this or any previous visit (from the past 240 hour(s)).        Radiology Studies: No results found.      Scheduled Meds:  acetaminophen  1,000 mg Oral Q6H   apixaban  5 mg Oral BID   Chlorhexidine Gluconate Cloth  6 each  Topical Daily   docusate sodium  100 mg Oral BID   furosemide  40 mg Intravenous BID   metoprolol tartrate  25 mg Oral BID   potassium chloride  40 mEq Oral BID   potassium chloride  40 mEq Oral Once   Continuous Infusions:  sodium chloride Stopped (03/18/23 0627)     LOS: 16 days    Time spent: over 30 min    Lacretia Nicks, MD [facility]   To contact the attending provider between 7A-7P or the covering provider during after hours 7P-7A, please log into the web site [email or web address] and access using universal [place] password for that web site. If you do not have the password, please call the hospital operator.  03/18/2023, 3:09 PM

## 2023-03-18 NOTE — Plan of Care (Signed)
  Problem: Clinical Measurements: Goal: Ability to maintain clinical measurements within normal limits will improve Outcome: Progressing Goal: Will remain free from infection Outcome: Progressing Goal: Diagnostic test results will improve Outcome: Progressing Goal: Respiratory complications will improve Outcome: Progressing Goal: Cardiovascular complication will be avoided Outcome: Progressing   Problem: Activity: Goal: Risk for activity intolerance will decrease Outcome: Not Progressing   Problem: Nutrition: Goal: Adequate nutrition will be maintained Outcome: Progressing   Problem: Coping: Goal: Level of anxiety will decrease Outcome: Progressing   Problem: Elimination: Goal: Will not experience complications related to bowel motility Outcome: Progressing Goal: Will not experience complications related to urinary retention Outcome: Progressing   Problem: Pain Managment: Goal: General experience of comfort will improve Outcome: Progressing   Problem: Safety: Goal: Ability to remain free from injury will improve Outcome: Progressing   Problem: Skin Integrity: Goal: Risk for impaired skin integrity will decrease Outcome: Progressing

## 2023-03-18 NOTE — TOC Progression Note (Signed)
Transition of Care (TOC) - Progression Note  Donn Pierini RN, BSN Transitions of Care Unit 4E- RN Case Manager See Treatment Team for direct phone # 12M cross coverage  Patient Details  Name: Cindy Holmes MRN: [id number] Date of Birth: 04-28-1951  Transition of Care [facility]) CM/SW Contact  Zenda Alpers, Lenn Sink, RN Phone Number: 03/18/2023, 3:37 PM  Clinical Narrative:    CM spoke with [employer] regarding DME delivery of bed and hoyer lift- need narrative note signed before they can process- MD has been notified and to co-sign note.  CM spoke with spouse to confirm that [employer] will call to schedule delivery- per spouse they were to come this am but did not show- explained to spouse that [employer] was missing documentation and could not deliver this am until missing documentation completed- discussed having DME scheduled for tomorrow am so that spouse would be ready for when pt cleared for discharge- spouse agreeable and voiced he would schedule delivery w/ [employer] for the am.  Pt/spouse also asking about [employer] for home- explained that insurance does not cover- CM to bring some info by room later regarding home [employer] and contact info should they want to see about cost out of pocket.   [employer] to follow up to call spouse to schedule delivery of bed/lift for the am per liaison.    Expected Discharge Plan: Home w Home Health Services Barriers to Discharge: Continued Medical Work up  Expected Discharge Plan and Services   Discharge Planning Services: CM Consult Post Acute Care Choice: Durable Medical Equipment, Home Health Living arrangements for the past 2 months: Single Family Home                 DME Arranged: Hospital bed (Hoyer lift) DME Agency: [employer] Date DME Agency Contacted: 03/11/23 Time DME Agency Contacted: 24 Representative spoke with at DME Agency: Earna Coder HH Arranged: RN, PT HH Agency: [facility]         Social Determinants of Health (SDOH)  Interventions SDOH Screenings   Food Insecurity: No Food Insecurity (03/05/2023)  Housing: Low Risk  (03/05/2023)  Transportation Needs: No Transportation Needs (03/05/2023)  Utilities: Not At Risk (03/05/2023)  Tobacco Use: Low Risk  (03/08/2023)    Readmission Risk Interventions    03/03/2023    3:06 PM 02/03/2023    3:58 PM  Readmission Risk Prevention Plan  Transportation Screening Complete Complete  PCP or Specialist Appt within 3-5 Days Complete Complete  HRI or Home Care Consult Complete Complete  Social Work Consult for Recovery Care Planning/Counseling Complete Complete  Palliative Care Screening Not Applicable Not Applicable  Medication Review [employer]) Referral to Pharmacy Referral to Pharmacy

## 2023-03-18 NOTE — Progress Notes (Signed)
Initial Nutrition Assessment  DOCUMENTATION CODES:   Obesity unspecified  INTERVENTION:  Continue regular diet   Encourage po intake   Dc Ensure- Patient is drinking Premier Protein BID, each supplement provides 160 kcal, 30 g protein   NUTRITION DIAGNOSIS:   Inadequate oral intake related to altered GI function, inability to eat as evidenced by NPO status. -ongoing   GOAL:   Patient will meet greater than or equal to 90% of their needs -progressing   MONITOR:   Diet advancement, Labs, Weight trends, Skin, I & O's  REASON FOR ASSESSMENT:   NPO/Clear Liquid Diet    ASSESSMENT:    72 yr old female with PMHx including CAD HFpEF, RA, lupus, chronic immunosuppression, HTN, prior CVA, paroxysmal A fib, HLD, pulmonary hypertension, chronic diarrhea, who is admitted for AKI and acute on chronic diarrhea. Colonoscopy 8/23 showed sigmoid stricture. She is POD #1 Sigmoid colectomy with Hartman's procedure and end colostomy, small bowel resection.  Visited patient at bedside whose husband was present. Patient reports she is trying her best to achieve adequate po intake but her taste perception has changed. She reports liking orange juice and sodas PTA and now she only prefers milk and water as her beverages of choice. She reports ordering soups and fruits more. Patient has Premier Protein at bedside brought in from home. She reports drinking 2 per day. Patient has been very edematous and diuresing. She has been hypokalemic and requiring aggressive replacement. She c/o burning sensation when getting IV K+ but reports she is unable to swallow the large pills. RD encouraged her to add banana, oranges, and potatoes to her daily intake to help naturally address hypokalemia. Patient denies N/V/D/C. She hopes that she can go home soon.   Labs: Na 128, K+ 3.4, Glu 107, BUN 7, AST 14 Meds: colace, lasix, KCL, magnesium sulfate  Wt: admit 229#, current 252#  Wt gain related to fluid accumulation.  Weight loss is being masked.   I/O's: -4.6 L (net cumulative)   NUTRITION - FOCUSED PHYSICAL EXAM:  Flowsheet Row Most Recent Value  Orbital Region Moderate depletion  Upper Arm Region No depletion  Thoracic and Lumbar Region Unable to assess  Buccal Region Mild depletion  Temple Region Moderate depletion  Clavicle Bone Region No depletion  Clavicle and Acromion Bone Region No depletion  Scapular Bone Region Unable to assess  Dorsal Hand Mild depletion  Patellar Region Unable to assess  Anterior Thigh Region Unable to assess  Posterior Calf Region Unable to assess  Edema (RD Assessment) Moderate  Hair Unable to assess  Eyes Reviewed  Mouth Reviewed  Skin Reviewed  Nails Unable to assess       Diet Order:   Diet Order             Diet regular Fluid consistency: Thin  Diet effective now                   EDUCATION NEEDS:   Education needs have been addressed  Skin:  Skin Assessment: Reviewed RN Assessment  Last BM:  200 ml output via colostomy 9/5  Height:   Ht Readings from Last 1 Encounters:  03/10/23 5\' 3"  (1.6 m)    Weight:   Wt Readings from Last 1 Encounters:  03/18/23 114.4 kg    Ideal Body Weight:     BMI:  Body mass index is 44.68 kg/m.  Estimated Nutritional Needs:   Kcal:  1600-1800  Protein:  120-150 g  Fluid:  UOP +  1000 ml   Leodis Rains, RDN, LDN  Clinical Nutrition

## 2023-03-18 NOTE — Consult Note (Signed)
WOC Nurse ostomy follow up Stoma type/location: LMQ colostomy  Stomal assessment/size: 1 1/4" x 1 3/8" oval, pink moist flush with skin  Peristomal assessment: mild erythema  Treatment options for stomal/peristomal skin: no sting skin barrier wipe, 2" barrier ring  Output approximately 100 mls soft brown stool  Ostomy pouching: 1pc. [employer] [place] [phone] Education provided: Patients husband able to perform entire dressing change with minimal verbal cueing including:  emptied current pouch using lock and roll mechanism, cleaned spout with toilet paper wick and closed pouch.   removed old pouch using push pull technique.  Husband cleaned around stoma with water moistened washcloth. Cut out new barrier at 1 1/4" round and I widened out to 1 3/8" oval.  I showed husband pattern in room that he can follow as well at home.  Husband placed 2" barrier ring around stoma, removed plastic backing from new pouch and placed over barrier ring. Husband ran finger around inner seal to help meld to skin.  Removed tape from flanges to secure.    Answered patients questions about medication and constipation/diarrhea with colostomy.  No changes necessary with medication.   Husband has already taken home supplies for a month.  Says he received secure start package.    Enrolled patient in [place] Secure Start Discharge program: Yes   WOC team will continue to follow as long as inpatient for ostomy education and support.   Thank you,    Priscella Mann MSN, RN-BC, [employer] [phone]

## 2023-03-19 DIAGNOSIS — N179 Acute kidney failure, unspecified: Secondary | ICD-10-CM | POA: Diagnosis not present

## 2023-03-19 MED ORDER — ALUM & MAG HYDROXIDE-SIMETH 200-200-20 MG/5ML PO SUSP
30.0000 mL | ORAL | Status: DC | PRN
  Filled 2023-03-19: qty 30

## 2023-03-19 MED ORDER — ALBUMIN HUMAN 25 % IV SOLN
25.0000 g | Freq: Four times a day (QID) | INTRAVENOUS | Status: DC
  Administered 2023-03-19: 25 g via INTRAVENOUS
  Filled 2023-03-19: qty 100

## 2023-03-19 NOTE — Progress Notes (Signed)
PROGRESS NOTE    Cindy Holmes  YHC:[id number] DOB: August 04, 1950 DOA: 03/02/2023 PCP: [name], MD  Chief Complaint  Patient presents with   Emesis   Diarrhea    Brief Narrative:   72 year old with hypertension, rheumatoid arthritis on immunosuppressive's, coronary artery disease, chronic diastolic heart failure, history of stroke presented to the ER with persistent diarrhea, nausea and vomiting.  Underwent colonoscopy and found to have sigmoid colon stenosis.  Surgery consulted.  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 Remains in the hospital, recovering from surgery.  Difficult mobility and poor participation.  Assessment & Plan:   Principal Problem:   AKI (acute kidney injury) (HCC) Active Problems:   Stricture of sigmoid colon (HCC)   Diarrhea   Essential hypertension   Hyperlipidemia   Rheumatoid arthritis (HCC)   PAF (paroxysmal atrial fibrillation) (HCC)   Morbid obesity with body mass index (BMI) of 40.0 to 49.9 (HCC)   Immunosuppression due to drug therapy [facility])   Wheelchair dependent  Acute on chronic diastolic heart failure with pulmonary hypertension bilateral leg edema vascular congestion  Has continued LE edema (8/30 cards note states "moderate leg edema" - I suspect she hasn't changed much) Echocardiogram with ejection fraction of 70%.  Severely elevated pulmonary artery systolic pressures.  Cardiology has seen patient preop (last note appears to be 8/30).  Scheduled potassium. On 8/30, noted to have "moderate leg edema" in cards note, suspect this is similar to today's exam Will give IV lasix and follow volume status -> this should help with mobility  S/p albumin Weights significantly up (doubt accurate, but trending up - awaiting repeat weight today), net negative Labs pending today   Sigmoid stricture, overflow diarrhea with recent history of enteropathogenic E. coli: Colonoscopy with Aidel Davisson strictured benign S/p sigmoid colectomy with Hartman's  procedure and end colostomy, small bowel resection  Tolerating regular diet. Pain management with oral pain medications.   Acute kidney injury: multifactorial.  Renal function stable.  Tolerating torsemide.   Paroxysmal Greco Gastelum-fib: Rate controlled.  On metoprolol.  Eliquis resumed.  Hemoglobin stable.      Rheumatoid arthritis: On methotrexate and Plaquenil on hold for procedure and wound healing.  Will resume on discharge.   Hyperlipidemia: On statin.   Anemia of chronic disease: Hb 7.7 today Will trend Iron, b12, folate, ferritin  Labs pending as of today   Hypokalemia: replace and follow (labs pending today)   Poor circulation left leg: Has Doppler pulses per my partner's evaluation.  ABI with noncompresssible LLE arternies, reduced L toe brachial index (noncompressible RLE arteries, normal R toe brachial index).  Would benefit from outpatient vascular follow up.   Orthostasis Will decrease dose of metop Hb low, discussed option of transfusion, she declined Continue to follow orthostatics as we diuresis cautiously   Continue to work with PT OT.  Discontinue foley.  Poorly motivated to mobilize.  Doesn't sound like she's interested in SNF, will see how she does with therapy.    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 Left ventricular ejection fraction, by estimation, is 65 to 70%. The  left ventricle has normal function. The left ventricle has no regional  wall motion abnormalities. Left ventricular diastolic parameters are  consistent with Grade II diastolic  dysfunction (pseudonormalization).   2. Right ventricular systolic function is normal.  The right ventricular  size is dilated. There is severely elevated pulmonary artery systolic  pressure. The estimated right ventricular systolic pressure is 61.5 mmHg.   3. Left atrial size was mildly dilated.   4. The mitral valve is normal in structure. Mild to moderate mitral valve  regurgitation.   5. Multiple jets. Tricuspid valve regurgitation is mild to moderate.   6. The aortic valve is tricuspid. Aortic valve regurgitation is not  visualized. No aortic stenosis is present.   7. The inferior vena cava is dilated in size with <50% respiratory  variability, suggesting right atrial pressure of 15 mmHg.   Comparison(s): Prior images reviewed side by side. RV is dilated compared  to prior on 4 chamber live views. RVSP has increased.   Antimicrobials:  Anti-infectives (From admission, onward)    Start     Dose/Rate Route Frequency Ordered Stop   03/08/23 1204  sodium chloride 0.9 % with cefoTEtan (CEFOTAN) ADS Med       Note to Pharmacy: [place] L: cabinet override      03/08/23 1204 03/08/23 1247   03/07/23 0600  cefoTEtan (CEFOTAN) 2 g in sodium chloride 0.9 % 100 mL IVPB  Status:  Discontinued        2 g 200 mL/hr over 30 Minutes Intravenous On call to O.R. 03/06/23 1037 03/08/23 0559   03/06/23 1400  neomycin (MYCIFRADIN) tablet 1,000 mg       Placed in "And" Linked Group   1,000 mg Oral 3 times per day 03/06/23 1037 03/06/23 2243   03/06/23 1400  metroNIDAZOLE (FLAGYL) tablet 1,000 mg       Placed in "And" Linked Group   1,000 mg Oral 3 times per day 03/06/23 1037 03/07/23 1359   03/02/23 2000  azithromycin (ZITHROMAX) 500 mg in sodium chloride 0.9 % 250 mL IVPB  Status:  Discontinued        500 mg 250 mL/hr over 60 Minutes Intravenous Every 24 hours 03/02/23 1959 03/04/23 1745       Subjective: No new complaints Husband at bedside  Objective: Vitals:   03/18/23 1945 03/18/23 2122 03/19/23 0501 03/19/23 0851  BP: (!) 99/40 (!) 109/48 (!) 101/50 (!) 105/46   Pulse: 73 (!) 109 90 100  Resp:  16 16 18   Temp:  98.3 F (36.8 C) 98.5 F (36.9 C) 98.3 F (36.8 C)  TempSrc:   Oral   SpO2:  96% 97% 95%  Weight:      Height:        Intake/Output Summary (Last 24 hours) at 03/19/2023 1612 Last data filed at 03/19/2023 0900 Gross per 24 hour  Intake 120 ml  Output 700 ml  Net -580 ml   Filed Weights   03/13/23 0500 03/17/23 1500 03/18/23 0632  Weight: 101 kg 108.6 kg 114.4 kg    Examination:  General: No acute distress. Sitting up in chair Cardiovascular: RRR Lungs: unlabored Neurological: Alert and oriented 3. Moves all extremities 4 with equal strength. Cranial nerves II through XII grossly intact. Skin: Warm and dry. No rashes or lesions. Extremities: bilateral LE edema   Data Reviewed: I have personally reviewed following labs and imaging studies  CBC: Recent Labs  Lab 03/13/23 0447 03/14/23 0456 03/17/23 1005 03/18/23 0754  WBC 8.2 8.5 9.2 9.5  NEUTROABS  --   --  6.3 7.0  HGB 7.8* 8.2* 7.7* 7.7*  HCT 24.8* 25.4* 24.2* 23.9*  MCV 95.0 94.1 92.0 90.5  PLT 272 292 282 290    Basic Metabolic Panel: Recent Labs  Lab 03/13/23 0447 03/15/23 0606 03/17/23 1005 03/18/23 0754  NA 134* 134* 130* 128*  K 3.4* 3.4* 2.8* 3.4*  CL 105 98 93* 91*  CO2 22 23 27 24   GLUCOSE 100* 118* 117* 107*  BUN 17 11 8  7*  CREATININE 0.97 0.83 0.76 0.76  CALCIUM 8.0* 8.3* 7.4* 7.6*  MG  --   --  1.3* 1.9  PHOS  --   --  3.2 2.9    GFR: Estimated Creatinine Clearance: 78.6 mL/min (by C-G formula based on SCr of 0.76 mg/dL).  Liver Function Tests: Recent Labs  Lab 03/17/23 1005 03/18/23 0754  AST 12* 14*  ALT 11 13  ALKPHOS 64 61  BILITOT 0.7 0.6  PROT 4.6* 4.9*  ALBUMIN 1.6* 2.1*    CBG: No results for input(s): "GLUCAP" in the last 168 hours.   No results found for this or any previous visit (from the past 240 hour(s)).        Radiology Studies: No results found.      Scheduled Meds:  acetaminophen   1,000 mg Oral Q6H   apixaban  5 mg Oral BID   Chlorhexidine Gluconate Cloth  6 each Topical Daily   docusate sodium  100 mg Oral BID   furosemide  40 mg Intravenous BID   metoprolol tartrate  12.5 mg Oral BID   potassium chloride  40 mEq Oral BID   Continuous Infusions:  sodium chloride Stopped (03/18/23 0627)   albumin human 25 g (03/19/23 1502)     LOS: 17 days    Time spent: over 30 min    Lacretia Nicks, MD [facility]   To contact the attending provider between 7A-7P or the covering provider during after hours 7P-7A, please log into the web site [email or web address] and access using universal [place] password for that web site. If you do not have the password, please call the hospital operator.  03/19/2023, 4:12 PM

## 2023-03-19 NOTE — Plan of Care (Signed)

## 2023-03-20 DIAGNOSIS — N179 Acute kidney failure, unspecified: Secondary | ICD-10-CM | POA: Diagnosis not present

## 2023-03-20 LAB — COMPREHENSIVE METABOLIC PANEL
ALT: 15 U/L (ref 0–44)
AST: 14 U/L — ABNORMAL LOW (ref 15–41)
Albumin: 2.7 g/dL — ABNORMAL LOW (ref 3.5–5.0)
Alkaline Phosphatase: 61 U/L (ref 38–126)
Anion gap: 10 (ref 5–15)
BUN: 8 mg/dL (ref 8–23)
CO2: 27 mmol/L (ref 22–32)
Calcium: 8.4 mg/dL — ABNORMAL LOW (ref 8.9–10.3)
Chloride: 93 mmol/L — ABNORMAL LOW (ref 98–111)
Creatinine, Ser: 0.87 mg/dL (ref 0.44–1.00)
GFR, Estimated: >60 mL/min (ref >60)
Glucose, Bld: 95 mg/dL (ref 70–99)
Potassium: 4.1 mmol/L (ref 3.5–5.1)
Sodium: 130 mmol/L — ABNORMAL LOW (ref 135–145)
Total Bilirubin: 0.6 mg/dL (ref 0.3–1.2)
Total Protein: 5.5 g/dL — ABNORMAL LOW (ref 6.5–8.1)

## 2023-03-20 LAB — CBC WITH DIFFERENTIAL/PLATELET
Abs Immature Granulocytes: 0.09 10*3/uL — ABNORMAL HIGH (ref 0.00–0.07)
Basophils Absolute: 0 10*3/uL (ref 0.0–0.1)
Basophils Relative: 1 %
Eosinophils Absolute: 0.7 10*3/uL — ABNORMAL HIGH (ref 0.0–0.5)
Eosinophils Relative: 9 %
HCT: 25.6 % — ABNORMAL LOW (ref 36.0–46.0)
Hemoglobin: 8 g/dL — ABNORMAL LOW (ref 12.0–15.0)
Immature Granulocytes: 1 %
Lymphocytes Relative: 16 %
Lymphs Abs: 1.2 10*3/uL (ref 0.7–4.0)
MCH: 28.6 pg (ref 26.0–34.0)
MCHC: 31.3 g/dL (ref 30.0–36.0)
MCV: 91.4 fL (ref 80.0–100.0)
Monocytes Absolute: 0.6 10*3/uL (ref 0.1–1.0)
Monocytes Relative: 9 %
Neutro Abs: 4.9 10*3/uL (ref 1.7–7.7)
Neutrophils Relative %: 64 %
Platelets: 332 10*3/uL (ref 150–400)
RBC: 2.8 MIL/uL — ABNORMAL LOW (ref 3.87–5.11)
RDW: 16.5 % — ABNORMAL HIGH (ref 11.5–15.5)
WBC: 7.6 10*3/uL (ref 4.0–10.5)
nRBC: 0 % (ref 0.0–0.2)

## 2023-03-20 LAB — FOLATE: Folate: 12.9 ng/mL (ref >5.9)

## 2023-03-20 LAB — VITAMIN B12: Vitamin B-12: 377 pg/mL (ref 180–914)

## 2023-03-20 LAB — IRON AND TIBC
Iron: 13 ug/dL — ABNORMAL LOW (ref 28–170)
Saturation Ratios: 7 % — ABNORMAL LOW (ref 10.4–31.8)
TIBC: 189 ug/dL — ABNORMAL LOW (ref 250–450)
UIBC: 176 ug/dL

## 2023-03-20 LAB — FERRITIN: Ferritin: 305 ng/mL (ref 11–307)

## 2023-03-20 MED ORDER — FERROUS SULFATE 325 (65 FE) MG PO TABS
325.0000 mg | ORAL_TABLET | ORAL | Status: DC
  Administered 2023-03-20: 325 mg via ORAL
  Filled 2023-03-20: qty 1

## 2023-03-20 NOTE — Plan of Care (Signed)

## 2023-03-20 NOTE — Progress Notes (Signed)
PROGRESS NOTE    Cindy Holmes  NWG:[id number] DOB: Dec 10, 1950 DOA: 03/02/2023 PCP: [name], MD  Chief Complaint  Patient presents with   Emesis   Diarrhea    Brief Narrative:   72 year old with hypertension, rheumatoid arthritis on immunosuppressive's, coronary artery disease, chronic diastolic heart failure, history of stroke presented to the ER with persistent diarrhea, nausea and vomiting.  Underwent colonoscopy and found to have sigmoid colon stenosis.  Surgery consulted.  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 Remains in the hospital, recovering from surgery.  Difficult mobility and poor participation.  Assessment & Plan:   Principal Problem:   AKI (acute kidney injury) (HCC) Active Problems:   Stricture of sigmoid colon (HCC)   Diarrhea   Essential hypertension   Hyperlipidemia   Rheumatoid arthritis (HCC)   PAF (paroxysmal atrial fibrillation) (HCC)   Morbid obesity with body mass index (BMI) of 40.0 to 49.9 (HCC)   Immunosuppression due to drug therapy [facility])   Wheelchair dependent  Acute on chronic diastolic heart failure with pulmonary hypertension bilateral leg edema vascular congestion  Has continued LE edema (8/30 cards note states "moderate leg edema" - I suspect she hasn't changed much) Echocardiogram with ejection fraction of 70%.  Severely elevated pulmonary artery systolic pressures.  Cardiology has seen patient preop (last note appears to be 8/30).  Scheduled potassium. On 8/30, noted to have "moderate leg edema" in cards note, suspect this is similar to today's exam Will give IV lasix and follow volume status -> this should help with mobility  S/p albumin Weights significantly up (doubt accurate, but trending up - awaiting repeat weight today), net negative Labs pending today   Sigmoid stricture, overflow diarrhea with recent history of enteropathogenic E. coli: Colonoscopy with [place] Plasencia strictured benign S/p sigmoid colectomy with Hartman's  procedure and end colostomy, small bowel resection  Tolerating regular diet. Pain management with oral pain medications. Surgery now signed off - she's asking about staples coming out, will discuss with surgery tomorrow   Acute kidney injury: multifactorial.  Renal function stable.  Tolerating torsemide.   Paroxysmal Toy Eisemann-fib: Rate controlled.  On metoprolol.  Eliquis resumed.  Hemoglobin stable.      Rheumatoid arthritis: On methotrexate and Plaquenil on hold for procedure and wound healing.  Will resume on discharge.   Hyperlipidemia: On statin.   Anemia of chronic disease  Iron Def Anemia: Hb 7.7 today Will trend Labs with iron def. Normal B12, folate.   Hypokalemia: follow, replace and follow   Poor circulation left leg: Has Doppler pulses per my partner's evaluation.  ABI with noncompresssible LLE arternies, reduced L toe brachial index (noncompressible RLE arteries, normal R toe brachial index).  Would benefit from outpatient vascular follow up.   Orthostasis Will decrease dose of metop Hb low, discussed option of transfusion, she declined Continue to follow orthostatics as we diuresis cautiously   Continue to work with PT OT.  Discontinue foley.  Poorly motivated to mobilize.  Doesn't sound like she's interested in SNF, will see how she does with therapy.  Goal to d/c in AM.    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 Left ventricular ejection fraction, by estimation, is 65 to 70%. The  left ventricle has normal function. The left ventricle has no regional  wall motion abnormalities. Left ventricular diastolic parameters are  consistent  with Grade II diastolic  dysfunction (pseudonormalization).   2. Right ventricular systolic function is normal. The right ventricular  size is dilated. There is severely elevated pulmonary artery systolic  pressure. The estimated right ventricular systolic pressure is 61.5 mmHg.   3. Left atrial size was mildly dilated.   4. The mitral valve is normal in structure. Mild to moderate mitral valve  regurgitation.   5. Multiple jets. Tricuspid valve regurgitation is mild to moderate.   6. The aortic valve is tricuspid. Aortic valve regurgitation is not  visualized. No aortic stenosis is present.   7. The inferior vena cava is dilated in size with <50% respiratory  variability, suggesting right atrial pressure of 15 mmHg.   Comparison(s): Prior images reviewed side by side. RV is dilated compared  to prior on 4 chamber live views. RVSP has increased.   Antimicrobials:  Anti-infectives (From admission, onward)    Start     Dose/Rate Route Frequency Ordered Stop   03/08/23 1204  sodium chloride 0.9 % with cefoTEtan (CEFOTAN) ADS Med       Note to Pharmacy: Susy [place] L: cabinet override      03/08/23 1204 03/08/23 1247   03/07/23 0600  cefoTEtan (CEFOTAN) 2 g in sodium chloride 0.9 % 100 mL IVPB  Status:  Discontinued        2 g 200 mL/hr over 30 Minutes Intravenous On call to O.R. 03/06/23 1037 03/08/23 0559   03/06/23 1400  neomycin (MYCIFRADIN) tablet 1,000 mg       Placed in "And" Linked Group   1,000 mg Oral 3 times per day 03/06/23 1037 03/06/23 2243   03/06/23 1400  metroNIDAZOLE (FLAGYL) tablet 1,000 mg       Placed in "And" Linked Group   1,000 mg Oral 3 times per day 03/06/23 1037 03/07/23 1359   03/02/23 2000  azithromycin (ZITHROMAX) 500 mg in sodium chloride 0.9 % 250 mL IVPB  Status:  Discontinued        500 mg 250 mL/hr over 60 Minutes Intravenous Every 24 hours 03/02/23 1959 03/04/23 1745       Subjective: No new complaints  Objective: Vitals:   03/19/23  1701 03/19/23 2120 03/20/23 0326 03/20/23 0858  BP: (!) 106/50 (!) 109/56 (!) 107/59 (!) 112/56  Pulse: (!) 55 94 90 (!) 107  Resp: 18 18 18 18   Temp: 98.2 F (36.8 C) 98.2 F (36.8 C) 98.1 F (36.7 C) 98.3 F (36.8 C)  TempSrc:  Oral Oral   SpO2: 97% 95% 96% 99%  Weight:      Height:        Intake/Output Summary (Last 24 hours) at 03/20/2023 1533 Last data filed at 03/20/2023 1354 Gross per 24 hour  Intake 231.06 ml  Output 1000 ml  Net -768.94 ml   Filed Weights   03/13/23 0500 03/17/23 1500 03/18/23 0632  Weight: 101 kg 108.6 kg 114.4 kg    Examination:  General: No acute distress. Cardiovascular: RRR Lungs: unlabored Neurological: Alert and oriented 3. Moves all extremities 4 with equal strength. Cranial nerves II through XII grossly intact. Extremities: continued LE edema   Data Reviewed: I have personally reviewed following labs and imaging studies  CBC: Recent Labs  Lab 03/14/23 0456 03/17/23 1005 03/18/23 0754 03/20/23 0552  WBC 8.5 9.2 9.5 7.6  NEUTROABS  --  6.3  7.0 4.9  HGB 8.2* 7.7* 7.7* 8.0*  HCT 25.4* 24.2* 23.9* 25.6*  MCV 94.1 92.0 90.5 91.4  PLT 292 282 290 332    Basic Metabolic Panel: Recent Labs  Lab 03/15/23 0606 03/17/23 1005 03/18/23 0754 03/20/23 0552  NA 134* 130* 128* 130*  K 3.4* 2.8* 3.4* 4.1  CL 98 93* 91* 93*  CO2 23 27 24 27   GLUCOSE 118* 117* 107* 95  BUN 11 8 7* 8  CREATININE 0.83 0.76 0.76 0.87  CALCIUM 8.3* 7.4* 7.6* 8.4*  MG  --  1.3* 1.9  --   PHOS  --  3.2 2.9  --     GFR: Estimated Creatinine Clearance: 72.3 mL/min (by C-G formula based on SCr of 0.87 mg/dL).  Liver Function Tests: Recent Labs  Lab 03/17/23 1005 03/18/23 0754 03/20/23 0552  AST 12* 14* 14*  ALT 11 13 15   ALKPHOS 64 61 61  BILITOT 0.7 0.6 0.6  PROT 4.6* 4.9* 5.5*  ALBUMIN 1.6* 2.1* 2.7*    CBG: No results for input(s): "GLUCAP" in the last 168 hours.   No results found for this or any previous visit (from the past 240  hour(s)).        Radiology Studies: No results found.      Scheduled Meds:  acetaminophen  1,000 mg Oral Q6H   apixaban  5 mg Oral BID   Chlorhexidine Gluconate Cloth  6 each Topical Daily   docusate sodium  100 mg Oral BID   ferrous sulfate  325 mg Oral QODAY   furosemide  40 mg Intravenous BID   metoprolol tartrate  12.5 mg Oral BID   potassium chloride  40 mEq Oral BID   Continuous Infusions:  sodium chloride Stopped (03/18/23 0625)     LOS: 18 days    Time spent: over 30 min    Lacretia Nicks, MD [facility]   To contact the attending provider between 7A-7P or the covering provider during after hours 7P-7A, please log into the web site [email or web address] and access using universal [place] password for that web site. If you do not have the password, please call the hospital operator.  03/20/2023, 3:33 PM

## 2023-03-21 ENCOUNTER — Other Ambulatory Visit (HOSPITAL_COMMUNITY): Payer: Self-pay

## 2023-03-21 DIAGNOSIS — N179 Acute kidney failure, unspecified: Secondary | ICD-10-CM | POA: Diagnosis not present

## 2023-03-21 LAB — COMPREHENSIVE METABOLIC PANEL
ALT: 11 U/L (ref 0–44)
AST: 11 U/L — ABNORMAL LOW (ref 15–41)
Albumin: 2.4 g/dL — ABNORMAL LOW (ref 3.5–5.0)
Alkaline Phosphatase: 64 U/L (ref 38–126)
Anion gap: 9 (ref 5–15)
BUN: 8 mg/dL (ref 8–23)
CO2: 25 mmol/L (ref 22–32)
Calcium: 8.3 mg/dL — ABNORMAL LOW (ref 8.9–10.3)
Chloride: 97 mmol/L — ABNORMAL LOW (ref 98–111)
Creatinine, Ser: 0.82 mg/dL (ref 0.44–1.00)
GFR, Estimated: >60 mL/min (ref >60)
Glucose, Bld: 99 mg/dL (ref 70–99)
Potassium: 3.9 mmol/L (ref 3.5–5.1)
Sodium: 131 mmol/L — ABNORMAL LOW (ref 135–145)
Total Bilirubin: 0.4 mg/dL (ref 0.3–1.2)
Total Protein: 5.1 g/dL — ABNORMAL LOW (ref 6.5–8.1)

## 2023-03-21 LAB — CBC WITH DIFFERENTIAL/PLATELET
Abs Immature Granulocytes: 0.08 10*3/uL — ABNORMAL HIGH (ref 0.00–0.07)
Basophils Absolute: 0.1 10*3/uL (ref 0.0–0.1)
Basophils Relative: 1 %
Eosinophils Absolute: 0.7 10*3/uL — ABNORMAL HIGH (ref 0.0–0.5)
Eosinophils Relative: 10 %
HCT: 23.7 % — ABNORMAL LOW (ref 36.0–46.0)
Hemoglobin: 7.6 g/dL — ABNORMAL LOW (ref 12.0–15.0)
Immature Granulocytes: 1 %
Lymphocytes Relative: 18 %
Lymphs Abs: 1.3 10*3/uL (ref 0.7–4.0)
MCH: 29.8 pg (ref 26.0–34.0)
MCHC: 32.1 g/dL (ref 30.0–36.0)
MCV: 92.9 fL (ref 80.0–100.0)
Monocytes Absolute: 0.7 10*3/uL (ref 0.1–1.0)
Monocytes Relative: 9 %
Neutro Abs: 4.5 10*3/uL (ref 1.7–7.7)
Neutrophils Relative %: 61 %
Platelets: 361 10*3/uL (ref 150–400)
RBC: 2.55 MIL/uL — ABNORMAL LOW (ref 3.87–5.11)
RDW: 16.4 % — ABNORMAL HIGH (ref 11.5–15.5)
WBC: 7.4 10*3/uL (ref 4.0–10.5)
nRBC: 0 % (ref 0.0–0.2)

## 2023-03-21 LAB — PHOSPHORUS: Phosphorus: 3.6 mg/dL (ref 2.5–4.6)

## 2023-03-21 LAB — MAGNESIUM: Magnesium: 1.7 mg/dL (ref 1.7–2.4)

## 2023-03-21 MED ORDER — ALLOPURINOL 100 MG PO TABS: 100.0000 mg | ORAL_TABLET | Freq: Every day | ORAL | Status: AC

## 2023-03-21 MED ORDER — FERROUS SULFATE 325 (65 FE) MG PO TABS
325.0000 mg | ORAL_TABLET | ORAL | 0 refills | Status: DC
  Filled 2023-03-21: qty 100, 200d supply, fill #0

## 2023-03-21 MED ORDER — TORSEMIDE 20 MG PO TABS
20.0000 mg | ORAL_TABLET | Freq: Two times a day (BID) | ORAL | 1 refills | Status: DC
  Filled 2023-03-21: qty 60, 30d supply, fill #0

## 2023-03-21 MED ORDER — POTASSIUM CHLORIDE CRYS ER 20 MEQ PO TBCR
20.0000 meq | EXTENDED_RELEASE_TABLET | Freq: Two times a day (BID) | ORAL | 0 refills | Status: DC
  Filled 2023-03-21: qty 28, 14d supply, fill #0

## 2023-03-21 MED ORDER — METOPROLOL TARTRATE 25 MG PO TABS
12.5000 mg | ORAL_TABLET | Freq: Two times a day (BID) | ORAL | 1 refills | Status: DC
  Filled 2023-03-21: qty 30, 30d supply, fill #0

## 2023-03-21 NOTE — Progress Notes (Signed)
DISCHARGE NOTE HOME Cindy Holmes to be discharged Home per MD order. Discussed prescriptions and follow up appointments with the patient. Prescriptions given to patient; medication list explained in detail. Patient verbalized understanding.  Skin clean, dry and intact without evidence of skin break down, no evidence of skin tears noted. IV catheter discontinued intact. Site without signs and symptoms of complications. Dressing and pressure applied. Pt denies pain at the site currently. No complaints noted.  Patient free of lines, drains, and wounds.   An After Visit Summary (AVS) was printed and given to the patient. Patient escorted via wheelchair, and discharged home via private auto.  Lorine Bears, RN

## 2023-03-21 NOTE — TOC Transition Note (Addendum)
Transition of Care [facility]) - CM/SW Discharge Note   Patient Details  Name: Cindy Holmes MRN: [id number] Date of Birth: 08-17-1950  Transition of Care [facility]) CM/SW Contact:  Tom-Johnson, Hershal Coria, RN Phone Number: 03/21/2023, 12:19 PM   Clinical Narrative:     Patient is scheduled for discharge today.  Readmission Risk Assessment done. Home Health info, Outpatient referral, hospital f/u and discharge instructions on AVS. Hospital bed, [facility]oyer Lift and [facility] to be delivered by [employer] today prior patient going home.  RN to give patient some Colostomy supplies and [facility] will order once patient is home.  Prescriptions sent to [facility] pharmacy and meds will be delivered to patient at bedside prior discharge. [facility] scheduled to transport at discharge.  No further TOC needs noted.       Final next level of care: Home w Home Health Services Barriers to Discharge: Barriers Resolved   Patient Goals and CMS Choice CMS Medicare.gov Compare Post Acute Care list provided to:: Patient Choice offered to / list presented to : Patient, Spouse  Discharge Placement                  Patient to be transferred to facility by: [facility] Name of family member notified: Husband, Robert    Discharge Plan and Services Additional resources added to the After Visit Summary for     Discharge Planning Services: CM Consult Post Acute Care Choice: Durable Medical Equipment, Home Health          DME Arranged: Bedside commode DME Agency: [employer] Date DME Agency Contacted: 03/11/23 Time DME Agency Contacted: 1505 Representative spoke with at DME Agency: Earna Coder HH Arranged: RN, PT HH Agency: [facility]        Social Determinants of Health (SDOH) Interventions SDOH Screenings   Food Insecurity: No Food Insecurity (03/05/2023)  Housing: Low Risk  (03/05/2023)  Transportation Needs: No Transportation Needs (03/05/2023)  Utilities: Not At Risk (03/05/2023)  Tobacco Use: Low Risk   (03/08/2023)     Readmission Risk Interventions    03/03/2023    3:06 PM 02/03/2023    3:58 PM  Readmission Risk Prevention Plan  Transportation Screening Complete Complete  PCP or Specialist Appt within 3-5 Days Complete Complete  HRI or Home Care Consult Complete Complete  Social Work Consult for Recovery Care Planning/Counseling Complete Complete  Palliative Care Screening Not Applicable Not Applicable  Medication Review Oceanographer) Referral to Pharmacy Referral to Pharmacy

## 2023-03-21 NOTE — Discharge Summary (Addendum)
Physician Discharge Summary  Cindy Holmes LKG:[id number] DOB: December 25, 1950 DOA: 03/02/2023  PCP: [name], MD  Admit date: 03/02/2023 Discharge date: 03/21/2023  Time spent: 40 minutes  Recommendations for Outpatient Follow-up:  Follow outpatient CBC/CMP  Follow volume status outpatient - may need adjustment in torsemide dose  Blood pressure medicines reduced due to soft BP here and orthostasis --- metoprolol reduced (ace and spironolactone d/c'd) Follow with surgery outpatient Follow with cardiology outpatient  Needs vascular follow up for abnormal ABI Needs repeat CT scan or MRI of L kidney within 6 months Allopurinol dose reduced, adjust outpatient as needed  Discharge Diagnoses:  Principal Problem:   AKI (acute kidney injury) (HCC) Active Problems:   Stricture of sigmoid colon (HCC)   Diarrhea   Essential hypertension   Hyperlipidemia   Rheumatoid arthritis (HCC)   PAF (paroxysmal atrial fibrillation) (HCC)   Morbid obesity with body mass index (BMI) of 40.0 to 49.9 (HCC)   Immunosuppression due to drug therapy [facility])   Wheelchair dependent   Discharge Condition: stable  Diet recommendation: heart healthy  Filed Weights   03/17/23 1500 03/18/23 0632 03/21/23 0500  Weight: 108.6 kg 114.4 kg 116.1 kg    History of present illness:   73 year old with hypertension, rheumatoid arthritis on immunosuppressive's, coronary artery disease, chronic diastolic heart failure, history of stroke presented to the ER with persistent diarrhea, nausea and vomiting.  Underwent colonoscopy and found to have sigmoid colon stenosis.  Surgery consulted.  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  Therapy recommended SNF, but she declined.  Discharging home with home health 9/9.  Hospitalization complicated by volume overload requiring diuresis.    See below for additional details   Hospital Course:  Assessment and Plan:  Acute on chronic diastolic heart failure with pulmonary  hypertension bilateral leg edema vascular congestion  Has continued LE edema (8/30 cards note states "moderate leg edema" - I suspect she hasn't changed much) Echocardiogram with ejection fraction of 70%.  Severely elevated pulmonary artery systolic pressures.  Cardiology has seen patient preop (last note appears to be 8/30).  Scheduled potassium. On 8/30, noted to have "moderate leg edema" in cards note, suspect this is similar to today's exam She notes improvement despite weights (?accuracy) and my exam which is grossly stable - will discharge with torsemide 20 mg BID which is what cardiology had her on.  She'll need to follow outpatient with her providers as her edema has not completely resolved.    Sigmoid stricture, overflow diarrhea with recent history of enteropathogenic E. 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 Pain management with oral pain medications. Surgery now signed off - staples out 9/9 after discussion with surgery - follow with surgery outpatient   Acute kidney injury: multifactorial.  Renal function stable.  Tolerating torsemide.   Paroxysmal Ameliyah Sarno-fib: Rate controlled.  On metoprolol.  Eliquis resumed.  Hemoglobin stable.      Rheumatoid arthritis: methotrexate and Plaquenil    Hyperlipidemia: On statin.   Anemia of chronic disease  Iron Def Anemia: Hb 7.6 today Will trend Labs with iron def. Normal B12, folate.   Hypokalemia: discharge with kcl    Poor circulation left leg: Has Doppler pulses per my partner's evaluation.  ABI with noncompresssible LLE arternies, reduced L toe brachial index (noncompressible RLE arteries, normal R toe brachial index).  Would benefit from outpatient vascular follow up. Referral placed   Orthostasis Will decrease  dose of metop Hb low, discussed option of transfusion, she declined Follow outpatient    Discharging to  home health      Procedures: Colonoscopy   Sigmoid colectomy with Hartman's procedure and end colostomy, small bowel resection  8/27  Echo IMPRESSIONS     1. Left ventricular ejection fraction, by estimation, is 65 to 70%. The  left ventricle has normal function. The left ventricle has no regional  wall motion abnormalities. Left ventricular diastolic parameters are  consistent with Grade II diastolic  dysfunction (pseudonormalization).   2. Right ventricular systolic function is normal. The right ventricular  size is dilated. There is severely elevated pulmonary artery systolic  pressure. The estimated right ventricular systolic pressure is 61.5 mmHg.   3. Left atrial size was mildly dilated.   4. The mitral valve is normal in structure. Mild to moderate mitral valve  regurgitation.   5. Multiple jets. Tricuspid valve regurgitation is mild to moderate.   6. The aortic valve is tricuspid. Aortic valve regurgitation is not  visualized. No aortic stenosis is present.   7. The inferior vena cava is dilated in size with <50% respiratory  variability, suggesting right atrial pressure of 15 mmHg.   Comparison(s): Prior images reviewed side by side. RV is dilated compared  to prior on 4 chamber live views. RVSP has increased.   ABI Summary:  Right: Resting right ankle-brachial index indicates noncompressible right  lower extremity arteries. The right toe-brachial index is normal.   Left: Resting left ankle-brachial index indicates noncompressible left  lower extremity arteries. The left toe-brachial index is reduced.   Consultations: Cardiology GI  surgery  Discharge Exam: Vitals:   03/21/23 0604 03/21/23 0954  BP: (!) 103/45 (!) 119/57  Pulse: 94 (!) 106  Resp: 20 18  Temp: 98.2 F (36.8 C) 98.6 F (37 C)  SpO2: 98% 98%   No complaints Discussed with husband  General: No acute distress. Cardiovascular: RRR Lungs: unlabored Abdomen: staples to midline incision,  ostomy with brown stool  Neurological: Alert and oriented 3. Moves all extremities 4 with equal strength. Cranial nerves II through XII grossly intact. Extremities: bilateral LE edema  Discharge Instructions   Discharge Instructions     (HEART FAILURE PATIENTS) Call MD:  Anytime you have any of the following symptoms: 1) 3 pound weight gain in 24 hours or 5 pounds in 1 week 2) shortness of breath, with or without Hayzel Ruberg dry hacking cough 3) swelling in the hands, feet or stomach 4) if you have to sleep on extra pillows at night in order to breathe.   Complete by: As directed    Amb Referral to Ostomy Clinic   Complete by: As directed    Reason for referral modifiers: Pre and post-operative counseling for ostomy management   Ambulatory referral to Vascular Surgery   Complete by: As directed    Call MD for:  difficulty breathing, headache or visual disturbances   Complete by: As directed    Call MD for:  extreme fatigue   Complete by: As directed    Call MD for:  hives   Complete by: As directed    Call MD for:  persistant dizziness or light-headedness   Complete by: As directed    Call MD for:  persistant nausea and vomiting   Complete by: As directed    Call MD for:  redness, tenderness, or signs of infection (pain, swelling, redness, odor or green/yellow discharge around incision site)   Complete by: As  directed    Call MD for:  severe uncontrolled pain   Complete by: As directed    Call MD for:  temperature >100.4   Complete by: As directed    Diet - low sodium heart healthy   Complete by: As directed    Discharge instructions   Complete by: As directed    You were seen for Lanyiah Brix sigmoid stricture.  You had Sariyah Corcino colectomy with Hartman's procedure and end colostomy and small bowel resection.  You should follow up with general surgery as recommended.  You were seen by cardiology during this hospital stay.  We've adjusted your medicines.  We've started you on torsemide 20 mg twice Aariah Godette day.   Your metoprolol dose has been reduced.  You should weigh yourself daily at home.  If you gain 3 lbs in Linford Quintela day or 5 lbs in Genesis Paget week, take an extra dose of torsemide for 3 days and call cardiology (or your PCP about instructions).    Your blood pressures are on the lower side.  Your metoprolol was reduced and your ramipril and spironolactone were discontinued.  Follow up with your PCP and/or cardiologist outpatient for further adjustment.   You have iron deficiency.  We've started you on iron.  Follow up with your PCP outpatient for additional workup and further recommendations.   I reduced your allopurinol for gout to 100 mg daily since you didn't get that here.  Please follow up with your PCP or primary prescriber outpatient to adjust this dose as needed.  You've had Jackston Oaxaca prolonged post op course.  We recommend [facility] skilled nursing facility for rehab, but you've declined this, so we'll arrange home health.  You will need to follow this hospitalization up with your specialist and PCP.  You'll need an MRI or repeat CT scan of your right kidney within 6 months.    You'll need vascular follow up outpatient for abnormal ankle brachial indices.    Return for new, recurrent, or worsening symptoms.  Please ask your PCP to request records from this hospitalization so they know what was done and what the next steps will be.   Increase activity slowly   Complete by: As directed       Allergies as of 03/21/2023       Reactions   Codeine Phosphate Shortness Of Breath   Morphine And Codeine Nausea And Vomiting   Amoxicillin Nausea And Vomiting   Penicillins Nausea And Vomiting   Has patient had Trung Wenzl PCN reaction causing immediate rash, facial/tongue/throat swelling, SOB or lightheadedness with hypotension:YES Has patient had Elanor Cale PCN reaction causing severe rash involving mucus membranes or skin necrosis: NO Has patient had Cortavius Montesinos PCN reaction that required hospitalization NO Has patient had Lia Vigilante PCN reaction occurring  within the last 10 years: NO If all of the above answers are "NO", then may proceed with Cephalosporin use.   Methotrexate Nausea Only   Lidocaine Rash   Reaction to lidocaine patch        Medication List     STOP taking these medications    azithromycin 500 MG tablet Commonly known as: ZITHROMAX   ciprofloxacin 500 MG tablet Commonly known as: CIPRO   furosemide 20 MG tablet Commonly known as: LASIX   ramipril 2.5 MG capsule Commonly known as: ALTACE   spironolactone 25 MG tablet Commonly known as: ALDACTONE       TAKE these medications    acetaminophen 500 MG tablet Commonly known as: TYLENOL Take 1,000 mg by  mouth every 6 (six) hours as needed for headache (pain).   acidophilus Caps capsule Take 2 capsules by mouth daily.   allopurinol 100 MG tablet Commonly known as: ZYLOPRIM Take 1 tablet (100 mg total) by mouth daily. Resume this at 100 mg daily follow with your PCP to titrate this back up as needed. What changed:  how much to take when to take this additional instructions   apixaban 5 MG Tabs tablet Commonly known as: ELIQUIS Take 1 tablet (5 mg total) by mouth 2 (two) times daily.   aspirin EC 81 MG tablet Take 81 mg by mouth at bedtime. Swallow whole.   atorvastatin 40 MG tablet Commonly known as: LIPITOR Take 2 tablets (80 mg total) by mouth daily   diphenoxylate-atropine 2.5-0.025 MG tablet Commonly known as: LOMOTIL Take 1 tablet by mouth 4 (four) times daily as needed for diarrhea or loose stools.   ezetimibe 10 MG tablet Commonly known as: ZETIA Take 1 tablet (10 mg total) by mouth daily.   ferrous sulfate 325 (65 FE) MG tablet Take 1 tablet (325 mg total) by mouth every other day. Follow with your PCP to follow your iron deficiency outpatient. Start taking on: March 22, 2023   Fish Oil 1000 MG Caps Take 1,000 mg by mouth at bedtime.   folic acid 1 MG tablet Commonly known as: FOLVITE Take 1 mg by mouth at bedtime.    hydroxychloroquine 200 MG tablet Commonly known as: PLAQUENIL Take 200 mg by mouth at bedtime.   loperamide 2 MG capsule Commonly known as: IMODIUM Take 1 capsule (2 mg total) by mouth as needed for diarrhea or loose stools.   methotrexate 2.5 MG tablet Commonly known as: RHEUMATREX Take 10 mg by mouth every Sunday.   metoprolol tartrate 25 MG tablet Commonly known as: LOPRESSOR Take 0.5 tablets (12.5 mg total) by mouth 2 (two) times daily. What changed:  See the new instructions. Another medication with the same name was removed. Continue taking this medication, and follow the directions you see here.   ondansetron 4 MG tablet Commonly known as: ZOFRAN Take 4 mg by mouth every 8 (eight) hours as needed for vomiting or nausea.   potassium chloride SA 20 MEQ tablet Commonly known as: KLOR-CON M Take 1 tablet (20 mEq total) by mouth 2 (two) times daily for 14 days. Repeat labs with your PCP within 7 days What changed:  when to take this additional instructions   torsemide 20 MG tablet Commonly known as: DEMADEX Take 1 tablet (20 mg total) by mouth 2 (two) times daily. Follow with PCP and cardiology for refills.   zolpidem 5 MG tablet Commonly known as: AMBIEN Take 5 mg by mouth at bedtime.               Durable Medical Equipment  (From admission, onward)           Start     Ordered   03/17/23 1648  For home use only DME Other see comment  Once       Comments: Hoyer lift. Pt is on Maekayla Giorgio decline with ability to complete sit to stands, not able to ambulate more than 3 steps with therapy team and would need increased support with transfers. Pt only assist is her husband but pt needs +2 to safely transfer OOB without DME (hoyer).  Question:  Length of Need  Answer:  12 Months   03/17/23 1649   03/11/23 1609  For home use only DME [facility]  bed  Once       Question Answer Comment  Length of Need Lifetime   The above medical condition requires: Patient requires the  ability to reposition frequently   Head must be elevated greater than: 30 degrees   Bed type Semi-electric   Hoyer Lift Yes      03/11/23 1609           Allergies  Allergen Reactions   Codeine Phosphate Shortness Of Breath   Morphine And Codeine Nausea And Vomiting   Amoxicillin Nausea And Vomiting   Penicillins Nausea And Vomiting    Has patient had Avonelle Viveros PCN reaction causing immediate rash, facial/tongue/throat swelling, SOB or lightheadedness with hypotension:YES Has patient had Lolitha Tortora PCN reaction causing severe rash involving mucus membranes or skin necrosis: NO Has patient had Pravin Perezperez PCN reaction that required hospitalization NO Has patient had Omesha Bowerman PCN reaction occurring within the last 10 years: NO If all of the above answers are "NO", then may proceed with Cephalosporin use.   Methotrexate Nausea Only   Lidocaine Rash    Reaction to lidocaine patch    Follow-up Information     [facility]. Call.   Specialty: General Surgery Contact information: [address] [place] [address] [phone]        Harriette Bouillon, MD. Go on 04/15/2023.   Specialty: General Surgery Why: Your appointment is 10/4 at 10am Please arrive 15 minutes early for check in. Contact information: [address] [place] [address] [phone]         [facility] Follow up.   Specialty: Home Health Services Why: Someone will call you to schedule first home visit. If you have not received Siarra Gilkerson call after two days of discharging home, call their number listed. If no one comes to assess, call Case Manager at [phone]. Contact information: [address] [place] [address] [phone]         [facility] Follow up.   Why: (Adapt)- hospital bed and hoyer lift arranged- delivery to home anticipated for 9/7 Contact information: [address] [place] [address] [phone]         [facility] Follow up.   Why: Alleyne Lac referral was placed, call for an appointment or ask your PCP to help arrange follow up if you don't hear from them Contact information: [address] [place] [address] [phone]        Jeani Hawking, MD Follow up.   Specialty: Gastroenterology Contact information: [address] [place] [address] [phone]         Mallipeddi, Orion Modest, MD Follow up.   Specialties: Cardiology, Internal Medicine Why: Call for Emelly Wurtz follow up appointment Contact information: [address] [place] [address] [phone]                  The results of significant diagnostics from this hospitalization (including imaging, microbiology, ancillary and laboratory) are listed below for reference.    Significant Diagnostic Studies: VAS [place] ABI WITH/WO TBI  Result Date: 03/13/2023  LOWER EXTREMITY DOPPLER STUDY Patient Name:  ARDIS STUDER  Date of Exam:   03/13/2023 Medical Rec #: [id number]        Accession #:    [id number] Date of Birth: September 05, 1950       Patient Gender: F Patient Age:   29 years Exam [facility]  [facility] Procedure:  VAS [place] ABI WITH/WO TBI Referring Phys: Dorcas Carrow --------------------------------------------------------------------------------  Indications: Cool left foot with Dopplerable pulses per ordering MD High Risk Factors: Hypertension, hyperlipidemia, coronary artery disease. 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 Paroxysmal atrial fibrillation.  Limitations: Today's exam was limited due to Edema, irregular heart beat, pain              with cuff compression, patient refused to have blood pressure taken              in upper arm and involuntary patient movement. Comparison Study: No prior study on file Performing Technologist: Sherren Kerns RVS  Examination Guidelines: Lundyn Coste complete evaluation includes at  minimum, Doppler waveform signals and systolic blood pressure reading at the level of bilateral brachial, anterior tibial, and posterior tibial arteries, when vessel segments are accessible. Bilateral testing is considered an integral part of Langford Carias complete examination. Photoelectric Plethysmograph (PPG) waveforms and toe systolic pressure readings are included as required and additional duplex testing as needed. Limited examinations for reoccurring indications may be performed as noted.  ABI Findings: +---------+------------------+-----+--------+---------+ Right    Rt Pressure (mmHg)IndexWaveformComment   +---------+------------------+-----+--------+---------+ Brachial                                Not taken +---------+------------------+-----+--------+---------+ PTA      254               2.47 biphasic          +---------+------------------+-----+--------+---------+ DP       142               1.38 biphasic          +---------+------------------+-----+--------+---------+ Great Toe81                0.79                   +---------+------------------+-----+--------+---------+ +---------+------------------+-----+---------+---------------+ Left     Lt Pressure (mmHg)IndexWaveform Comment         +---------+------------------+-----+---------+---------------+ Brachial 103                    triphasicRADIAL PRESSURE +---------+------------------+-----+---------+---------------+ PTA      169               1.64 biphasic                 +---------+------------------+-----+---------+---------------+ DP       254               2.47 biphasic                 +---------+------------------+-----+---------+---------------+ Great Toe62                0.60                          +---------+------------------+-----+---------+---------------+ +-------+---------------------+-----------+------------+------------+ ABI/TBIToday's ABI          Today's TBIPrevious ABIPrevious  TBI +-------+---------------------+-----------+------------+------------+ Right  2.47 non compressible0.79                                +-------+---------------------+-----------+------------+------------+ Left   2.47 non compressible0.60                                +-------+---------------------+-----------+------------+------------+  Arterial wall calcification precludes accurate ankle pressures and ABIs.  Summary: Right: Resting right ankle-brachial index indicates noncompressible right lower extremity arteries. The right toe-brachial index is normal. Left: Resting left ankle-brachial index indicates noncompressible left lower extremity arteries. The left toe-brachial index is reduced. *See table(s) above for measurements and observations.  Electronically signed by Lemar Livings MD on 03/13/2023 at 4:08:01 PM.    Final    PERIPHERAL VASCULAR CATHETERIZATION  Result Date: 03/08/2023 See surgical note for result.  ECHOCARDIOGRAM COMPLETE  Result Date: 03/06/2023    ECHOCARDIOGRAM REPORT   Patient Name:   CHUA SAVOY [facility] Date of Exam: 03/06/2023 Medical Rec #:  [id number]       Height:       63.0 in Accession #:    [id number]      Weight:       229.9 lb Date of Birth:  May 26, 1951      BSA:          2.052 m Patient Age:    71 years        BP:           119/52 mmHg Patient Gender: F               HR:           79 bpm. Exam Location:  Inpatient Procedure: 2D Echo, Color Doppler and Cardiac Doppler Indications:    fluid overload  History:        Patient has prior history of Echocardiogram examinations, most                 recent 10/27/2020. CAD; Risk Factors:Hypertension and                 Dyslipidemia.  Sonographer:    Delcie Roch RDCS Referring Phys: Nilda Calamity AKULA IMPRESSIONS  1. Left ventricular ejection fraction, by estimation, is 65 to 70%. The left ventricle has normal function. The left ventricle has no regional wall motion abnormalities. Left ventricular diastolic parameters are  consistent with Grade II diastolic dysfunction (pseudonormalization).  2. Right ventricular systolic function is normal. The right ventricular size is dilated. There is severely elevated pulmonary artery systolic pressure. The estimated right ventricular systolic pressure is 61.5 mmHg.  3. Left atrial size was mildly dilated.  4. The mitral valve is normal in structure. Mild to moderate mitral valve regurgitation.  5. Multiple jets. Tricuspid valve regurgitation is mild to moderate.  6. The aortic valve is tricuspid. Aortic valve regurgitation is not visualized. No aortic stenosis is present.  7. The inferior vena cava is dilated in size with <50% respiratory variability, suggesting right atrial pressure of 15 mmHg. Comparison(s): Prior images reviewed side by side. RV is dilated compared to prior on 4 chamber live views. RVSP has increased. FINDINGS  Left Ventricle: Left ventricular ejection fraction, by estimation, is 65 to 70%. The left ventricle has normal function. The left ventricle has no regional wall motion abnormalities. The left ventricular internal cavity size was normal in size. There is  no left ventricular hypertrophy. Left ventricular diastolic parameters are consistent with Grade II diastolic dysfunction (pseudonormalization). Right Ventricle: The right ventricular size is dilated. No increase in right ventricular wall thickness. Right ventricular systolic function is normal. There is severely elevated pulmonary artery systolic pressure. The tricuspid regurgitant velocity is 3.41 m/s, and with an assumed right atrial pressure of 15 mmHg, the estimated right ventricular systolic pressure is 61.5 mmHg. Left Atrium: Left atrial size was mildly  dilated. Right Atrium: Right atrial size was normal in size. Pericardium: Trivial pericardial effusion is present. The pericardial effusion is circumferential. Presence of epicardial fat layer. Mitral Valve: The mitral valve is normal in structure. Mild to  moderate mitral valve regurgitation. Tricuspid Valve: Multiple jets. The tricuspid valve is normal in structure. Tricuspid valve regurgitation is mild to moderate. Aortic Valve: The aortic valve is tricuspid. Aortic valve regurgitation is not visualized. No aortic stenosis is present. Pulmonic Valve: The pulmonic valve was normal in structure. Pulmonic valve regurgitation is trivial. No evidence of pulmonic stenosis. Aorta: The aortic root, ascending aorta and aortic arch are all structurally normal, with no evidence of dilitation or obstruction. Venous: The inferior vena cava is dilated in size with less than 50% respiratory variability, suggesting right atrial pressure of 15 mmHg. IAS/Shunts: The atrial septum is grossly normal.  LEFT VENTRICLE PLAX 2D LVIDd:         5.20 cm   Diastology LVIDs:         3.00 cm   LV e' medial:    6.85 cm/s LV PW:         0.80 cm   LV E/e' medial:  21.6 LV IVS:        0.90 cm   LV e' lateral:   7.62 cm/s LVOT diam:     1.80 cm   LV E/e' lateral: 19.4 LV SV:         61 LV SV Index:   30 LVOT Area:     2.54 cm  RIGHT VENTRICLE             IVC RV Basal diam:  2.60 cm     IVC diam: 2.40 cm RV S prime:     11.70 cm/s TAPSE (M-mode): 1.9 cm LEFT ATRIUM             Index        RIGHT ATRIUM           Index LA diam:        4.40 cm 2.14 cm/m   RA Area:     13.70 cm LA Vol (A2C):   75.2 ml 36.65 ml/m  RA Volume:   30.00 ml  14.62 ml/m LA Vol (A4C):   62.0 ml 30.22 ml/m LA Biplane Vol: 69.1 ml 33.68 ml/m  AORTIC VALVE LVOT Vmax:   128.00 cm/s LVOT Vmean:  83.400 cm/s LVOT VTI:    0.239 m  AORTA Ao Root diam: 3.00 cm Ao Asc diam:  3.40 cm MITRAL VALVE                  TRICUSPID VALVE MV Area (PHT): 4.15 cm       TR Peak grad:   46.5 mmHg MV Decel Time: 183 msec       TR Vmax:        341.00 cm/s MR Peak grad:    82.8 mmHg MR Mean grad:    59.0 mmHg    SHUNTS MR Vmax:         455.00 cm/s  Systemic VTI:  0.24 m MR Vmean:        364.0 cm/s   Systemic Diam: 1.80 cm MR PISA:         0.57 cm  MR PISA Eff ROA: 5 mm MR PISA Radius:  0.30 cm MV E velocity: 148.00 cm/s MV Rodney Yera velocity: 77.10 cm/s MV E/Ricahrd Schwager ratio:  1.92 Riley Lam MD Electronically signed by Riley Lam MD Signature  Date/Time: 03/06/2023/1:55:10 PM    Final    DG CHEST PORT 1 VIEW  Result Date: 03/05/2023 CLINICAL DATA:  Wheezing EXAM: PORTABLE CHEST 1 VIEW COMPARISON:  02/02/2023 FINDINGS: Cardiac shadow is within normal limits. Mild aortic calcifications are seen. The lungs are well aerated bilaterally. Mild central vascular congestion is again noted. No significant edema is seen. No focal infiltrate is noted. No bony abnormality is seen. IMPRESSION: Mild vascular congestion stable from the prior exam. Electronically Signed   By: Alcide Clever M.D.   On: 03/05/2023 19:05   US RENAL  Result Date: 03/04/2023 CLINICAL DATA:  Renal lesion. EXAM: RENAL / URINARY TRACT ULTRASOUND COMPLETE COMPARISON:  Noncontrast CT 03/03/2023 and older CTs FINDINGS: Right Kidney: Renal measurements: 9.4 x 4.6 x 5.4 cm = volume: 122.3 mL. Echogenicity within normal limits. No mass or hydronephrosis visualized. Left Kidney: Renal measurements: 9.1 x 5.4 x 5.2 cm = volume: 132.5 mL. Mild left-sided renal atrophy, parenchymal loss. No collecting system dilatation or perinephric fluid. Bladder: Bladder is underdistended.  Grossly preserved contour. Other: None. IMPRESSION: No collecting system dilatation. Mild global atrophy of the left kidney. Of note no specific renal lesion identified. Overall, recommend either MRI evaluation or follow up CT scan in 6 months Electronically Signed   By: Karen Kays M.D.   On: 03/04/2023 18:39   CT ABDOMEN PELVIS WO CONTRAST  Result Date: 03/03/2023 CLINICAL DATA:  Abdominal pain EXAM: CT ABDOMEN AND PELVIS WITHOUT CONTRAST TECHNIQUE: Multidetector CT imaging of the abdomen and pelvis was performed following the standard protocol without IV contrast. RADIATION DOSE REDUCTION: This exam was performed  according to the departmental dose-optimization program which includes automated exposure control, adjustment of the mA and/or kV according to patient size and/or use of iterative reconstruction technique. COMPARISON:  CT abdomen and pelvis 02/02/2023 FINDINGS: Lower chest: No acute abnormality. Hepatobiliary: No focal liver abnormality is seen. Status post cholecystectomy. No biliary dilatation. Pancreas: Unremarkable. No pancreatic ductal dilatation or surrounding inflammatory changes. Spleen: Normal in size without focal abnormality. Adrenals/Urinary Tract: There is Anishka Bushard rounded hypodensity in the superior pole the right kidney measuring less than 1 cm, unchanged. There is no hydronephrosis or urinary tract calculus. The adrenal glands and bladder are within normal limits. Stomach/Bowel: Stomach is within normal limits. Gastric band in place. Appendix appears normal. No acute inflammation, pneumatosis or free air. There some subtle questionable wall thickening of the mid sigmoid colon measuring 5.5 cm in length on image 6/63. Vascular/Lymphatic: Aortic atherosclerosis. No enlarged abdominal or pelvic lymph nodes. Reproductive: Uterus and bilateral adnexa are unremarkable. Other: No abdominal wall hernia or abnormality. There is mild body wall edema. No abdominopelvic ascites. Musculoskeletal: Compression deformities of L2, L3 and L4 appear stable. IMPRESSION: 1. Questionable wall thickening of the mid sigmoid colon. Correlate clinically for colitis. Neoplasm can not be excluded. Recommend further evaluation with colonoscopy. 2. Mild body wall edema. 3. Stable hypodense lesion in the right kidney, indeterminate. This can be further evaluated with ultrasound or MRI. 4. Gastric band in place. No bowel obstruction. 5. Aortic atherosclerosis. Aortic Atherosclerosis (ICD10-I70.0). Electronically Signed   By: Darliss Cheney M.D.   On: 03/03/2023 23:08   VAS [place] LOWER EXTREMITY VENOUS (DVT)  Result Date: 03/03/2023  Lower  Venous DVT Study Patient Name:  JANIRAH REAPE [facility]  Date of Exam:   03/03/2023 Medical Rec #: [id number]        Accession #:    [id number] Date of Birth: [birth year]  Patient Gender: F Patient Age:   75 years Exam Location:  [facility] Procedure:      VAS [place] LOWER EXTREMITY VENOUS (DVT) Referring Phys: Kathlen Mody --------------------------------------------------------------------------------  Indications: Swelling.  Comparison Study: No previous study. Performing Technologist: McKayla Maag RVT, [place]  Examination Guidelines: Vong Garringer complete evaluation includes B-mode imaging, spectral Doppler, color Doppler, and power Doppler as needed of all accessible portions of each vessel. Bilateral testing is considered an integral part of Brindle Leyba complete examination. Limited examinations for reoccurring indications may be performed as noted. The reflux portion of the exam is performed with the patient in reverse Trendelenburg.  +--------+---------------+---------+-----------+----------+--------------------+ RIGHT   CompressibilityPhasicitySpontaneityPropertiesThrombus Aging       +--------+---------------+---------+-----------+----------+--------------------+ CFV     Full           Yes      Yes                                       +--------+---------------+---------+-----------+----------+--------------------+ SFJ     Full                                                              +--------+---------------+---------+-----------+----------+--------------------+ FV Prox Full                                                              +--------+---------------+---------+-----------+----------+--------------------+ FV Mid  Full                                                              +--------+---------------+---------+-----------+----------+--------------------+ FV                     Yes      Yes                  patent by color      Distal                                                doppler              +--------+---------------+---------+-----------+----------+--------------------+ PFV     Full                                                              +--------+---------------+---------+-----------+----------+--------------------+ POP                    Yes      Yes  patent by color                                                           doppler              +--------+---------------+---------+-----------+----------+--------------------+ PTV                                                  Not visualized       +--------+---------------+---------+-----------+----------+--------------------+ PERO                                                 Not visualized       +--------+---------------+---------+-----------+----------+--------------------+   +--------+---------------+---------+-----------+----------+--------------------+ LEFT    CompressibilityPhasicitySpontaneityPropertiesThrombus Aging       +--------+---------------+---------+-----------+----------+--------------------+ CFV     Full           Yes      Yes                                       +--------+---------------+---------+-----------+----------+--------------------+ SFJ     Full                                                              +--------+---------------+---------+-----------+----------+--------------------+ FV Prox Full                                                              +--------+---------------+---------+-----------+----------+--------------------+ FV Mid  Full                                                              +--------+---------------+---------+-----------+----------+--------------------+ FV                     Yes      Yes                  patent by color      Distal                                               doppler               +--------+---------------+---------+-----------+----------+--------------------+ PFV     Full                                                              +--------+---------------+---------+-----------+----------+--------------------+  POP     Full           Yes      Yes                                       +--------+---------------+---------+-----------+----------+--------------------+ PTV                                                  Not visualized       +--------+---------------+---------+-----------+----------+--------------------+ PERO                                                 Not visualized       +--------+---------------+---------+-----------+----------+--------------------+     Summary: RIGHT: - There is no evidence of deep vein thrombosis in the lower extremity. However, portions of this examination were limited- see technologist comments above.  - No cystic structure found in the popliteal fossa.  LEFT: - There is no evidence of deep vein thrombosis in the lower extremity. However, portions of this examination were limited- see technologist comments above.  - No cystic structure found in the popliteal fossa.  *See table(s) above for measurements and observations. Electronically signed by Coral Else MD on 03/03/2023 at 9:40:06 PM.    Final     Microbiology: No results found for this or any previous visit (from the past 240 hour(s)).   Labs: Basic Metabolic Panel: Recent Labs  Lab 03/15/23 0606 03/17/23 1005 03/18/23 0754 03/20/23 0552 03/21/23 0525  NA 134* 130* 128* 130* 131*  K 3.4* 2.8* 3.4* 4.1 3.9  CL 98 93* 91* 93* 97*  CO2 23 27 24 27 25   GLUCOSE 118* 117* 107* 95 99  BUN 11 8 7* 8 8  CREATININE 0.83 0.76 0.76 0.87 0.82  CALCIUM 8.3* 7.4* 7.6* 8.4* 8.3*  MG  --  1.3* 1.9  --  1.7  PHOS  --  3.2 2.9  --  3.6   Liver Function Tests: Recent Labs  Lab 03/17/23 1005 03/18/23 0754 03/20/23 0552 03/21/23 0525  AST 12* 14* 14* 11*  ALT  11 13 15 11   ALKPHOS 64 61 61 64  BILITOT 0.7 0.6 0.6 0.4  PROT 4.6* 4.9* 5.5* 5.1*  ALBUMIN 1.6* 2.1* 2.7* 2.4*   No results for input(s): "LIPASE", "AMYLASE" in the last 168 hours. No results for input(s): "AMMONIA" in the last 168 hours. CBC: Recent Labs  Lab 03/17/23 1005 03/18/23 0754 03/20/23 0552 03/21/23 0525  WBC 9.2 9.5 7.6 7.4  NEUTROABS 6.3 7.0 4.9 4.5  HGB 7.7* 7.7* 8.0* 7.6*  HCT 24.2* 23.9* 25.6* 23.7*  MCV 92.0 90.5 91.4 92.9  PLT 282 290 332 361   Cardiac Enzymes: No results for input(s): "CKTOTAL", "CKMB", "CKMBINDEX", "TROPONINI" in the last 168 hours. BNP: BNP (last 3 results) Recent Labs    03/05/23 0459  BNP 369.4*    ProBNP (last 3 results) Recent Labs    05/19/22 1204  PROBNP 380*    CBG: No results for input(s): "GLUCAP" in the last 168 hours.     Signed:  Lacretia Nicks MD.  [facility] 03/21/2023, 11:43 AM

## 2023-03-21 NOTE — Care Management Important Message (Signed)
Important Message  Patient Details  Name: Cindy Holmes MRN: [id number] Date of Birth: [birth year]   Medicare Important Message Given:  Yes     Kamilo Och Stefan Church 03/21/2023, 2:41 PM

## 2023-03-21 NOTE — Progress Notes (Signed)
22 Staples removed per surgery order. Site free and clear of pus, purulent, and drainage. No signs of infections. Patient tolerated removal well. MD notified.

## 2023-03-21 NOTE — Progress Notes (Signed)
PT Cancellation Note  Patient Details Name: CAITRIN PUFAHL MRN: [id number] DOB: [birth year]   Cancelled Treatment:    Reason Eval/Treat Not Completed: Patient declined, no reason specified. Pt declines PT session at this time, preparing to discharge home.   Arlyss Gandy 03/21/2023, 2:17 PM

## 2023-03-24 DIAGNOSIS — I2510 Atherosclerotic heart disease of native coronary artery without angina pectoris: Secondary | ICD-10-CM | POA: Diagnosis not present

## 2023-03-24 DIAGNOSIS — R197 Diarrhea, unspecified: Secondary | ICD-10-CM | POA: Diagnosis not present

## 2023-03-24 DIAGNOSIS — M810 Age-related osteoporosis without current pathological fracture: Secondary | ICD-10-CM | POA: Diagnosis not present

## 2023-03-24 DIAGNOSIS — D509 Iron deficiency anemia, unspecified: Secondary | ICD-10-CM | POA: Diagnosis not present

## 2023-03-24 DIAGNOSIS — T8131XD Disruption of external operation (surgical) wound, not elsewhere classified, subsequent encounter: Secondary | ICD-10-CM | POA: Diagnosis not present

## 2023-03-24 DIAGNOSIS — I272 Pulmonary hypertension, unspecified: Secondary | ICD-10-CM | POA: Diagnosis not present

## 2023-03-24 DIAGNOSIS — Z993 Dependence on wheelchair: Secondary | ICD-10-CM | POA: Diagnosis not present

## 2023-03-24 DIAGNOSIS — I110 Hypertensive heart disease with heart failure: Secondary | ICD-10-CM | POA: Diagnosis not present

## 2023-03-24 DIAGNOSIS — M069 Rheumatoid arthritis, unspecified: Secondary | ICD-10-CM | POA: Diagnosis not present

## 2023-03-24 DIAGNOSIS — Z6841 Body Mass Index (BMI) 40.0 and over, adult: Secondary | ICD-10-CM | POA: Diagnosis not present

## 2023-03-24 DIAGNOSIS — D84821 Immunodeficiency due to drugs: Secondary | ICD-10-CM | POA: Diagnosis not present

## 2023-03-24 DIAGNOSIS — Z7982 Long term (current) use of aspirin: Secondary | ICD-10-CM | POA: Diagnosis not present

## 2023-03-24 DIAGNOSIS — Z7901 Long term (current) use of anticoagulants: Secondary | ICD-10-CM | POA: Diagnosis not present

## 2023-03-24 DIAGNOSIS — Z433 Encounter for attention to colostomy: Secondary | ICD-10-CM | POA: Diagnosis not present

## 2023-03-24 DIAGNOSIS — Z8673 Personal history of transient ischemic attack (TIA), and cerebral infarction without residual deficits: Secondary | ICD-10-CM | POA: Diagnosis not present

## 2023-03-24 DIAGNOSIS — I480 Paroxysmal atrial fibrillation: Secondary | ICD-10-CM | POA: Diagnosis not present

## 2023-03-24 DIAGNOSIS — Z8781 Personal history of (healed) traumatic fracture: Secondary | ICD-10-CM | POA: Diagnosis not present

## 2023-03-24 DIAGNOSIS — E785 Hyperlipidemia, unspecified: Secondary | ICD-10-CM | POA: Diagnosis not present

## 2023-03-24 DIAGNOSIS — N179 Acute kidney failure, unspecified: Secondary | ICD-10-CM | POA: Diagnosis not present

## 2023-03-24 DIAGNOSIS — E876 Hypokalemia: Secondary | ICD-10-CM | POA: Diagnosis not present

## 2023-03-24 DIAGNOSIS — I5033 Acute on chronic diastolic (congestive) heart failure: Secondary | ICD-10-CM | POA: Diagnosis not present

## 2023-03-29 ENCOUNTER — Inpatient Hospital Stay (HOSPITAL_COMMUNITY)
Admission: EM | Admit: 2023-03-29 | Discharge: 2023-03-31 | DRG: 291 | Disposition: A | Discharge status: Home Health | Payer: Medicare Other | Attending: Internal Medicine | Admitting: Internal Medicine

## 2023-03-29 ENCOUNTER — Other Ambulatory Visit: Payer: Self-pay

## 2023-03-29 ENCOUNTER — Emergency Department (HOSPITAL_COMMUNITY): Payer: Medicare Other

## 2023-03-29 DIAGNOSIS — R600 Localized edema: Secondary | ICD-10-CM | POA: Diagnosis not present

## 2023-03-29 DIAGNOSIS — Z7960 Long term (current) use of unspecified immunomodulators and immunosuppressants: Secondary | ICD-10-CM | POA: Diagnosis not present

## 2023-03-29 DIAGNOSIS — I480 Paroxysmal atrial fibrillation: Secondary | ICD-10-CM | POA: Diagnosis present

## 2023-03-29 DIAGNOSIS — I509 Heart failure, unspecified: Secondary | ICD-10-CM

## 2023-03-29 DIAGNOSIS — T8131XD Disruption of external operation (surgical) wound, not elsewhere classified, subsequent encounter: Secondary | ICD-10-CM | POA: Diagnosis not present

## 2023-03-29 DIAGNOSIS — Z8379 Family history of other diseases of the digestive system: Secondary | ICD-10-CM

## 2023-03-29 DIAGNOSIS — Z6841 Body Mass Index (BMI) 40.0 and over, adult: Secondary | ICD-10-CM | POA: Diagnosis not present

## 2023-03-29 DIAGNOSIS — Z79899 Other long term (current) drug therapy: Secondary | ICD-10-CM | POA: Diagnosis not present

## 2023-03-29 DIAGNOSIS — Z9049 Acquired absence of other specified parts of digestive tract: Secondary | ICD-10-CM | POA: Diagnosis not present

## 2023-03-29 DIAGNOSIS — Z933 Colostomy status: Secondary | ICD-10-CM

## 2023-03-29 DIAGNOSIS — R918 Other nonspecific abnormal finding of lung field: Secondary | ICD-10-CM | POA: Diagnosis not present

## 2023-03-29 DIAGNOSIS — Z993 Dependence on wheelchair: Secondary | ICD-10-CM

## 2023-03-29 DIAGNOSIS — Z66 Do not resuscitate: Secondary | ICD-10-CM | POA: Diagnosis present

## 2023-03-29 DIAGNOSIS — M069 Rheumatoid arthritis, unspecified: Secondary | ICD-10-CM | POA: Diagnosis not present

## 2023-03-29 DIAGNOSIS — Z8781 Personal history of (healed) traumatic fracture: Secondary | ICD-10-CM

## 2023-03-29 DIAGNOSIS — Z885 Allergy status to narcotic agent status: Secondary | ICD-10-CM

## 2023-03-29 DIAGNOSIS — Z7901 Long term (current) use of anticoagulants: Secondary | ICD-10-CM | POA: Diagnosis not present

## 2023-03-29 DIAGNOSIS — Z803 Family history of malignant neoplasm of breast: Secondary | ICD-10-CM

## 2023-03-29 DIAGNOSIS — D638 Anemia in other chronic diseases classified elsewhere: Secondary | ICD-10-CM | POA: Diagnosis not present

## 2023-03-29 DIAGNOSIS — I6523 Occlusion and stenosis of bilateral carotid arteries: Secondary | ICD-10-CM

## 2023-03-29 DIAGNOSIS — E785 Hyperlipidemia, unspecified: Secondary | ICD-10-CM | POA: Diagnosis present

## 2023-03-29 DIAGNOSIS — Z8261 Family history of arthritis: Secondary | ICD-10-CM

## 2023-03-29 DIAGNOSIS — I10 Essential (primary) hypertension: Secondary | ICD-10-CM | POA: Diagnosis not present

## 2023-03-29 DIAGNOSIS — I482 Chronic atrial fibrillation, unspecified: Secondary | ICD-10-CM | POA: Diagnosis not present

## 2023-03-29 DIAGNOSIS — R0602 Shortness of breath: Secondary | ICD-10-CM | POA: Diagnosis not present

## 2023-03-29 DIAGNOSIS — M810 Age-related osteoporosis without current pathological fracture: Secondary | ICD-10-CM | POA: Diagnosis present

## 2023-03-29 DIAGNOSIS — J90 Pleural effusion, not elsewhere classified: Secondary | ICD-10-CM | POA: Diagnosis not present

## 2023-03-29 DIAGNOSIS — I739 Peripheral vascular disease, unspecified: Secondary | ICD-10-CM | POA: Diagnosis present

## 2023-03-29 DIAGNOSIS — I110 Hypertensive heart disease with heart failure: Principal | ICD-10-CM | POA: Diagnosis present

## 2023-03-29 DIAGNOSIS — Z9889 Other specified postprocedural states: Secondary | ICD-10-CM

## 2023-03-29 DIAGNOSIS — R531 Weakness: Secondary | ICD-10-CM | POA: Diagnosis not present

## 2023-03-29 DIAGNOSIS — I5033 Acute on chronic diastolic (congestive) heart failure: Secondary | ICD-10-CM

## 2023-03-29 DIAGNOSIS — Z880 Allergy status to penicillin: Secondary | ICD-10-CM

## 2023-03-29 DIAGNOSIS — Z884 Allergy status to anesthetic agent status: Secondary | ICD-10-CM

## 2023-03-29 DIAGNOSIS — I959 Hypotension, unspecified: Secondary | ICD-10-CM | POA: Diagnosis not present

## 2023-03-29 DIAGNOSIS — D84821 Immunodeficiency due to drugs: Secondary | ICD-10-CM | POA: Diagnosis not present

## 2023-03-29 DIAGNOSIS — Z9181 History of falling: Secondary | ICD-10-CM | POA: Diagnosis not present

## 2023-03-29 DIAGNOSIS — E8770 Fluid overload, unspecified: Principal | ICD-10-CM

## 2023-03-29 DIAGNOSIS — Z7401 Bed confinement status: Secondary | ICD-10-CM | POA: Diagnosis not present

## 2023-03-29 DIAGNOSIS — Z800 Family history of malignant neoplasm of digestive organs: Secondary | ICD-10-CM

## 2023-03-29 DIAGNOSIS — Z9884 Bariatric surgery status: Secondary | ICD-10-CM

## 2023-03-29 DIAGNOSIS — Z888 Allergy status to other drugs, medicaments and biological substances status: Secondary | ICD-10-CM

## 2023-03-29 DIAGNOSIS — Z8673 Personal history of transient ischemic attack (TIA), and cerebral infarction without residual deficits: Secondary | ICD-10-CM

## 2023-03-29 DIAGNOSIS — Z809 Family history of malignant neoplasm, unspecified: Secondary | ICD-10-CM

## 2023-03-29 DIAGNOSIS — Z8719 Personal history of other diseases of the digestive system: Secondary | ICD-10-CM

## 2023-03-29 DIAGNOSIS — E876 Hypokalemia: Secondary | ICD-10-CM | POA: Diagnosis not present

## 2023-03-29 DIAGNOSIS — Z433 Encounter for attention to colostomy: Secondary | ICD-10-CM | POA: Diagnosis not present

## 2023-03-29 DIAGNOSIS — I2510 Atherosclerotic heart disease of native coronary artery without angina pectoris: Secondary | ICD-10-CM | POA: Diagnosis not present

## 2023-03-29 DIAGNOSIS — Z7982 Long term (current) use of aspirin: Secondary | ICD-10-CM | POA: Diagnosis not present

## 2023-03-29 DIAGNOSIS — Z87448 Personal history of other diseases of urinary system: Secondary | ICD-10-CM

## 2023-03-29 DIAGNOSIS — R000 Tachycardia, unspecified: Secondary | ICD-10-CM | POA: Diagnosis not present

## 2023-03-29 DIAGNOSIS — Z955 Presence of coronary angioplasty implant and graft: Secondary | ICD-10-CM

## 2023-03-29 DIAGNOSIS — Z9851 Tubal ligation status: Secondary | ICD-10-CM

## 2023-03-29 DIAGNOSIS — R1012 Left upper quadrant pain: Secondary | ICD-10-CM | POA: Diagnosis not present

## 2023-03-29 DIAGNOSIS — R609 Edema, unspecified: Secondary | ICD-10-CM | POA: Diagnosis not present

## 2023-03-29 DIAGNOSIS — Z833 Family history of diabetes mellitus: Secondary | ICD-10-CM

## 2023-03-29 DIAGNOSIS — R0689 Other abnormalities of breathing: Secondary | ICD-10-CM | POA: Diagnosis not present

## 2023-03-29 DIAGNOSIS — I272 Pulmonary hypertension, unspecified: Secondary | ICD-10-CM | POA: Diagnosis not present

## 2023-03-29 DIAGNOSIS — Z808 Family history of malignant neoplasm of other organs or systems: Secondary | ICD-10-CM

## 2023-03-29 DIAGNOSIS — Z8249 Family history of ischemic heart disease and other diseases of the circulatory system: Secondary | ICD-10-CM

## 2023-03-29 LAB — CBC
HCT: 25.6 % — ABNORMAL LOW (ref 36.0–46.0)
Hemoglobin: 8.1 g/dL — ABNORMAL LOW (ref 12.0–15.0)
MCH: 28.7 pg (ref 26.0–34.0)
MCHC: 31.6 g/dL (ref 30.0–36.0)
MCV: 90.8 fL (ref 80.0–100.0)
Platelets: 440 10*3/uL — ABNORMAL HIGH (ref 150–400)
RBC: 2.82 MIL/uL — ABNORMAL LOW (ref 3.87–5.11)
RDW: 15.9 % — ABNORMAL HIGH (ref 11.5–15.5)
WBC: 7.1 10*3/uL (ref 4.0–10.5)
nRBC: 0 % (ref 0.0–0.2)

## 2023-03-29 LAB — COMPREHENSIVE METABOLIC PANEL
ALT: 9 U/L (ref 0–44)
AST: 11 U/L — ABNORMAL LOW (ref 15–41)
Albumin: 2.4 g/dL — ABNORMAL LOW (ref 3.5–5.0)
Alkaline Phosphatase: 75 U/L (ref 38–126)
Anion gap: 10 (ref 5–15)
BUN: 8 mg/dL (ref 8–23)
CO2: 25 mmol/L (ref 22–32)
Calcium: 7.8 mg/dL — ABNORMAL LOW (ref 8.9–10.3)
Chloride: 96 mmol/L — ABNORMAL LOW (ref 98–111)
Creatinine, Ser: 0.8 mg/dL (ref 0.44–1.00)
GFR, Estimated: >60 mL/min (ref >60)
Glucose, Bld: 107 mg/dL — ABNORMAL HIGH (ref 70–99)
Potassium: 2.9 mmol/L — ABNORMAL LOW (ref 3.5–5.1)
Sodium: 131 mmol/L — ABNORMAL LOW (ref 135–145)
Total Bilirubin: 0.6 mg/dL (ref 0.3–1.2)
Total Protein: 5.5 g/dL — ABNORMAL LOW (ref 6.5–8.1)

## 2023-03-29 LAB — TROPONIN I (HIGH SENSITIVITY)
Troponin I (High Sensitivity): 9 ng/L (ref <18)
Troponin I (High Sensitivity): 9 ng/L (ref <18)

## 2023-03-29 LAB — BRAIN NATRIURETIC PEPTIDE: B Natriuretic Peptide: 152 pg/mL — ABNORMAL HIGH (ref 0.0–100.0)

## 2023-03-29 MED ORDER — FUROSEMIDE 10 MG/ML IJ SOLN
40.0000 mg | Freq: Once | INTRAMUSCULAR | Status: AC
  Administered 2023-03-29: 40 mg via INTRAVENOUS
  Filled 2023-03-29: qty 4

## 2023-03-29 MED ORDER — POTASSIUM CHLORIDE CRYS ER 20 MEQ PO TBCR: 40.0000 meq | EXTENDED_RELEASE_TABLET | Freq: Once | ORAL | Status: DC

## 2023-03-29 MED ORDER — MAGNESIUM SULFATE IN D5W 1-5 GM/100ML-% IV SOLN
1.0000 g | Freq: Once | INTRAVENOUS | Status: AC
  Administered 2023-03-29: 1 g via INTRAVENOUS
  Filled 2023-03-29: qty 100

## 2023-03-29 MED ORDER — IOHEXOL 350 MG/ML SOLN
75.0000 mL | Freq: Once | INTRAVENOUS | Status: AC | PRN
  Administered 2023-03-29: 75 mL via INTRAVENOUS

## 2023-03-29 MED ORDER — ALBUMIN HUMAN 25 % IV SOLN
25.0000 g | Freq: Once | INTRAVENOUS | Status: AC
  Administered 2023-03-30: 25 g via INTRAVENOUS
  Filled 2023-03-29: qty 100

## 2023-03-29 MED ORDER — MAGNESIUM OXIDE -MG SUPPLEMENT 400 (240 MG) MG PO TABS
800.0000 mg | ORAL_TABLET | Freq: Once | ORAL | Status: DC
  Filled 2023-03-29: qty 2

## 2023-03-29 MED ORDER — POTASSIUM CHLORIDE 10 MEQ/100ML IV SOLN
10.0000 meq | Freq: Once | INTRAVENOUS | Status: AC
  Administered 2023-03-29: 10 meq via INTRAVENOUS
  Filled 2023-03-29: qty 100

## 2023-03-29 NOTE — ED Notes (Signed)
Patient transported to CT 

## 2023-03-29 NOTE — ED Triage Notes (Signed)
Pt bib [facility] from home c/o fluid retention. Pt states she feels like she is having trouble breathing, hx of CHF. Pt does have pitting edema in lower extremities. States she has been taking Lasix as prescribed    Pt had colostomy bag placed 3 weeks ago.

## 2023-03-30 ENCOUNTER — Inpatient Hospital Stay (HOSPITAL_COMMUNITY): Payer: Medicare Other

## 2023-03-30 ENCOUNTER — Encounter (HOSPITAL_COMMUNITY): Payer: Self-pay | Admitting: Family Medicine

## 2023-03-30 DIAGNOSIS — E876 Hypokalemia: Secondary | ICD-10-CM

## 2023-03-30 DIAGNOSIS — M069 Rheumatoid arthritis, unspecified: Secondary | ICD-10-CM | POA: Diagnosis present

## 2023-03-30 DIAGNOSIS — D84821 Immunodeficiency due to drugs: Secondary | ICD-10-CM | POA: Diagnosis present

## 2023-03-30 DIAGNOSIS — I739 Peripheral vascular disease, unspecified: Secondary | ICD-10-CM | POA: Diagnosis present

## 2023-03-30 DIAGNOSIS — I5033 Acute on chronic diastolic (congestive) heart failure: Secondary | ICD-10-CM

## 2023-03-30 DIAGNOSIS — D638 Anemia in other chronic diseases classified elsewhere: Secondary | ICD-10-CM | POA: Diagnosis present

## 2023-03-30 DIAGNOSIS — E785 Hyperlipidemia, unspecified: Secondary | ICD-10-CM | POA: Diagnosis present

## 2023-03-30 DIAGNOSIS — Z79899 Other long term (current) drug therapy: Secondary | ICD-10-CM | POA: Diagnosis not present

## 2023-03-30 DIAGNOSIS — Z9049 Acquired absence of other specified parts of digestive tract: Secondary | ICD-10-CM | POA: Diagnosis not present

## 2023-03-30 DIAGNOSIS — R1012 Left upper quadrant pain: Secondary | ICD-10-CM | POA: Diagnosis not present

## 2023-03-30 DIAGNOSIS — R531 Weakness: Secondary | ICD-10-CM | POA: Diagnosis not present

## 2023-03-30 DIAGNOSIS — Z9181 History of falling: Secondary | ICD-10-CM | POA: Diagnosis not present

## 2023-03-30 DIAGNOSIS — I110 Hypertensive heart disease with heart failure: Secondary | ICD-10-CM | POA: Diagnosis present

## 2023-03-30 DIAGNOSIS — Z7401 Bed confinement status: Secondary | ICD-10-CM | POA: Diagnosis not present

## 2023-03-30 DIAGNOSIS — Z7901 Long term (current) use of anticoagulants: Secondary | ICD-10-CM | POA: Diagnosis not present

## 2023-03-30 DIAGNOSIS — Z933 Colostomy status: Secondary | ICD-10-CM | POA: Diagnosis not present

## 2023-03-30 DIAGNOSIS — I480 Paroxysmal atrial fibrillation: Secondary | ICD-10-CM

## 2023-03-30 DIAGNOSIS — I509 Heart failure, unspecified: Secondary | ICD-10-CM

## 2023-03-30 DIAGNOSIS — I482 Chronic atrial fibrillation, unspecified: Secondary | ICD-10-CM

## 2023-03-30 DIAGNOSIS — Z7982 Long term (current) use of aspirin: Secondary | ICD-10-CM | POA: Diagnosis not present

## 2023-03-30 DIAGNOSIS — I10 Essential (primary) hypertension: Secondary | ICD-10-CM | POA: Diagnosis not present

## 2023-03-30 DIAGNOSIS — Z993 Dependence on wheelchair: Secondary | ICD-10-CM | POA: Diagnosis not present

## 2023-03-30 DIAGNOSIS — Z66 Do not resuscitate: Secondary | ICD-10-CM | POA: Diagnosis present

## 2023-03-30 DIAGNOSIS — Z8673 Personal history of transient ischemic attack (TIA), and cerebral infarction without residual deficits: Secondary | ICD-10-CM | POA: Diagnosis not present

## 2023-03-30 DIAGNOSIS — I2510 Atherosclerotic heart disease of native coronary artery without angina pectoris: Secondary | ICD-10-CM | POA: Diagnosis present

## 2023-03-30 DIAGNOSIS — M810 Age-related osteoporosis without current pathological fracture: Secondary | ICD-10-CM | POA: Diagnosis present

## 2023-03-30 DIAGNOSIS — Z6841 Body Mass Index (BMI) 40.0 and over, adult: Secondary | ICD-10-CM | POA: Diagnosis not present

## 2023-03-30 DIAGNOSIS — Z7960 Long term (current) use of unspecified immunomodulators and immunosuppressants: Secondary | ICD-10-CM | POA: Diagnosis not present

## 2023-03-30 DIAGNOSIS — I959 Hypotension, unspecified: Secondary | ICD-10-CM | POA: Diagnosis not present

## 2023-03-30 LAB — COMPREHENSIVE METABOLIC PANEL WITH GFR
ALT: 10 U/L (ref 0–44)
AST: 9 U/L — ABNORMAL LOW (ref 15–41)
Albumin: 2.6 g/dL — ABNORMAL LOW (ref 3.5–5.0)
Alkaline Phosphatase: 63 U/L (ref 38–126)
Anion gap: 12 (ref 5–15)
BUN: 8 mg/dL (ref 8–23)
CO2: 25 mmol/L (ref 22–32)
Calcium: 7.6 mg/dL — ABNORMAL LOW (ref 8.9–10.3)
Chloride: 93 mmol/L — ABNORMAL LOW (ref 98–111)
Creatinine, Ser: 0.79 mg/dL (ref 0.44–1.00)
GFR, Estimated: >60 mL/min (ref >60)
Glucose, Bld: 98 mg/dL (ref 70–99)
Potassium: 2.8 mmol/L — ABNORMAL LOW (ref 3.5–5.1)
Sodium: 130 mmol/L — ABNORMAL LOW (ref 135–145)
Total Bilirubin: 0.6 mg/dL (ref 0.3–1.2)
Total Protein: 5.1 g/dL — ABNORMAL LOW (ref 6.5–8.1)

## 2023-03-30 LAB — ECHOCARDIOGRAM COMPLETE
AR max vel: 1.69 cm2
AV Area VTI: 1.77 cm2
AV Area mean vel: 1.77 cm2
AV Mean grad: 15 mmHg
AV Peak grad: 26.7 mmHg
Ao pk vel: 2.59 m/s
Area-P 1/2: 3.42 cm2
MV M vel: 3.84 m/s
MV Peak grad: 59 mmHg
S' Lateral: 2.9 cm
Weight: 3880.1 [oz_av]

## 2023-03-30 LAB — POTASSIUM: Potassium: 2.8 mmol/L — ABNORMAL LOW (ref 3.5–5.1)

## 2023-03-30 LAB — MAGNESIUM: Magnesium: 1.7 mg/dL (ref 1.7–2.4)

## 2023-03-30 LAB — CBC WITH DIFFERENTIAL/PLATELET
Abs Immature Granulocytes: 0.07 10*3/uL (ref 0.00–0.07)
Basophils Absolute: 0.1 10*3/uL (ref 0.0–0.1)
Basophils Relative: 1 %
Eosinophils Absolute: 0.5 10*3/uL (ref 0.0–0.5)
Eosinophils Relative: 7 %
HCT: 23.4 % — ABNORMAL LOW (ref 36.0–46.0)
Hemoglobin: 7.3 g/dL — ABNORMAL LOW (ref 12.0–15.0)
Immature Granulocytes: 1 %
Lymphocytes Relative: 15 %
Lymphs Abs: 0.9 10*3/uL (ref 0.7–4.0)
MCH: 28.9 pg (ref 26.0–34.0)
MCHC: 31.2 g/dL (ref 30.0–36.0)
MCV: 92.5 fL (ref 80.0–100.0)
Monocytes Absolute: 0.6 10*3/uL (ref 0.1–1.0)
Monocytes Relative: 10 %
Neutro Abs: 4.3 10*3/uL (ref 1.7–7.7)
Neutrophils Relative %: 66 %
Platelets: 394 10*3/uL (ref 150–400)
RBC: 2.53 MIL/uL — ABNORMAL LOW (ref 3.87–5.11)
RDW: 15.9 % — ABNORMAL HIGH (ref 11.5–15.5)
WBC: 6.4 10*3/uL (ref 4.0–10.5)
nRBC: 0 % (ref 0.0–0.2)

## 2023-03-30 LAB — PHOSPHORUS: Phosphorus: 3.3 mg/dL (ref 2.5–4.6)

## 2023-03-30 MED ORDER — EZETIMIBE 10 MG PO TABS
10.0000 mg | ORAL_TABLET | Freq: Every day | ORAL | Status: DC
  Administered 2023-03-30 – 2023-03-31 (×2): 10 mg via ORAL
  Filled 2023-03-30 (×2): qty 1

## 2023-03-30 MED ORDER — ONDANSETRON HCL 4 MG PO TABS: 4.0000 mg | ORAL_TABLET | Freq: Four times a day (QID) | ORAL | Status: DC | PRN

## 2023-03-30 MED ORDER — FERROUS SULFATE 325 (65 FE) MG PO TABS
325.0000 mg | ORAL_TABLET | ORAL | Status: DC
  Administered 2023-03-31: 325 mg via ORAL
  Filled 2023-03-30: qty 1

## 2023-03-30 MED ORDER — POTASSIUM CHLORIDE 20 MEQ PO PACK
40.0000 meq | PACK | ORAL | Status: AC
  Administered 2023-03-30: 40 meq via ORAL
  Filled 2023-03-30 (×2): qty 2

## 2023-03-30 MED ORDER — METOPROLOL TARTRATE 25 MG PO TABS
12.5000 mg | ORAL_TABLET | Freq: Two times a day (BID) | ORAL | Status: DC
  Administered 2023-03-30 – 2023-03-31 (×4): 12.5 mg via ORAL
  Filled 2023-03-30 (×4): qty 1

## 2023-03-30 MED ORDER — FUROSEMIDE 10 MG/ML IJ SOLN
60.0000 mg | Freq: Two times a day (BID) | INTRAMUSCULAR | Status: DC
  Administered 2023-03-30 (×2): 60 mg via INTRAVENOUS
  Filled 2023-03-30 (×2): qty 6

## 2023-03-30 MED ORDER — METOPROLOL TARTRATE 5 MG/5ML IV SOLN
2.5000 mg | Freq: Once | INTRAVENOUS | Status: AC
  Administered 2023-03-30: 2.5 mg via INTRAVENOUS
  Filled 2023-03-30: qty 5

## 2023-03-30 MED ORDER — POTASSIUM CHLORIDE 20 MEQ PO PACK
40.0000 meq | PACK | Freq: Once | ORAL | Status: DC
  Filled 2023-03-30: qty 2

## 2023-03-30 MED ORDER — SENNOSIDES-DOCUSATE SODIUM 8.6-50 MG PO TABS: 1.0000 | ORAL_TABLET | Freq: Every evening | ORAL | Status: DC | PRN

## 2023-03-30 MED ORDER — POTASSIUM CHLORIDE 20 MEQ PO PACK: 40.0000 meq | PACK | Freq: Once | ORAL | Status: DC

## 2023-03-30 MED ORDER — ACETAMINOPHEN 325 MG PO TABS
650.0000 mg | ORAL_TABLET | Freq: Four times a day (QID) | ORAL | Status: DC | PRN
  Administered 2023-03-30: 650 mg via ORAL
  Filled 2023-03-30: qty 2

## 2023-03-30 MED ORDER — HYDRALAZINE HCL 20 MG/ML IJ SOLN: 10.0000 mg | INTRAMUSCULAR | Status: DC | PRN

## 2023-03-30 MED ORDER — POTASSIUM CHLORIDE 10 MEQ/100ML IV SOLN
10.0000 meq | INTRAVENOUS | Status: AC
  Administered 2023-03-31 (×3): 10 meq via INTRAVENOUS
  Filled 2023-03-30 (×2): qty 100

## 2023-03-30 MED ORDER — FOLIC ACID 1 MG PO TABS
1.0000 mg | ORAL_TABLET | Freq: Every day | ORAL | Status: DC
  Administered 2023-03-30 – 2023-03-31 (×2): 1 mg via ORAL
  Filled 2023-03-30 (×2): qty 1

## 2023-03-30 MED ORDER — ACETAMINOPHEN 650 MG RE SUPP: 650.0000 mg | Freq: Four times a day (QID) | RECTAL | Status: DC | PRN

## 2023-03-30 MED ORDER — IPRATROPIUM-ALBUTEROL 0.5-2.5 (3) MG/3ML IN SOLN: 3.0000 mL | RESPIRATORY_TRACT | Status: DC | PRN

## 2023-03-30 MED ORDER — TRAZODONE HCL 50 MG PO TABS: 50.0000 mg | ORAL_TABLET | Freq: Every evening | ORAL | Status: DC | PRN

## 2023-03-30 MED ORDER — PANTOPRAZOLE SODIUM 40 MG PO TBEC
40.0000 mg | DELAYED_RELEASE_TABLET | Freq: Every day | ORAL | Status: DC
  Administered 2023-03-30 – 2023-03-31 (×2): 40 mg via ORAL
  Filled 2023-03-30 (×2): qty 1

## 2023-03-30 MED ORDER — POTASSIUM CHLORIDE CRYS ER 20 MEQ PO TBCR
40.0000 meq | EXTENDED_RELEASE_TABLET | ORAL | Status: DC
  Filled 2023-03-30: qty 2

## 2023-03-30 MED ORDER — METOPROLOL TARTRATE 5 MG/5ML IV SOLN: 5.0000 mg | INTRAVENOUS | Status: DC | PRN

## 2023-03-30 MED ORDER — ATORVASTATIN CALCIUM 40 MG PO TABS
80.0000 mg | ORAL_TABLET | Freq: Every day | ORAL | Status: DC
  Administered 2023-03-30 – 2023-03-31 (×2): 80 mg via ORAL
  Filled 2023-03-30 (×2): qty 2

## 2023-03-30 MED ORDER — FUROSEMIDE 10 MG/ML IJ SOLN: 40.0000 mg | Freq: Two times a day (BID) | INTRAMUSCULAR | Status: DC

## 2023-03-30 MED ORDER — ASPIRIN 81 MG PO TBEC
81.0000 mg | DELAYED_RELEASE_TABLET | Freq: Every day | ORAL | Status: DC
  Administered 2023-03-30 – 2023-03-31 (×2): 81 mg via ORAL
  Filled 2023-03-30 (×2): qty 1

## 2023-03-30 MED ORDER — ONDANSETRON HCL 4 MG/2ML IJ SOLN: 4.0000 mg | Freq: Four times a day (QID) | INTRAMUSCULAR | Status: DC | PRN

## 2023-03-30 MED ORDER — OXYCODONE HCL 5 MG PO TABS: 5.0000 mg | ORAL_TABLET | ORAL | Status: DC | PRN

## 2023-03-30 MED ORDER — APIXABAN 5 MG PO TABS
5.0000 mg | ORAL_TABLET | Freq: Two times a day (BID) | ORAL | Status: DC
  Administered 2023-03-30 – 2023-03-31 (×4): 5 mg via ORAL
  Filled 2023-03-30 (×4): qty 1

## 2023-03-30 NOTE — Plan of Care (Signed)
  Problem: Education: Goal: Understanding of discharge needs will improve Outcome: Progressing Goal: Verbalization of understanding of the causes of altered bowel function will improve Outcome: Progressing   Problem: Activity: Goal: Ability to tolerate increased activity will improve Outcome: Progressing   Problem: Bowel/Gastric: Goal: Gastrointestinal status for postoperative course will improve Outcome: Progressing   Problem: Health Behavior/Discharge Planning: Goal: Identification of community resources to assist with postoperative recovery needs will improve Outcome: Progressing   Problem: Nutritional: Goal: Will attain and maintain optimal nutritional status will improve Outcome: Progressing   Problem: Clinical Measurements: Goal: Postoperative complications will be avoided or minimized Outcome: Progressing   Problem: Respiratory: Goal: Respiratory status will improve Outcome: Progressing   Problem: Skin Integrity: Goal: Will show signs of wound healing Outcome: Progressing   Problem: Education: Goal: Knowledge of General Education information will improve Description: Including pain rating scale, medication(s)/side effects and non-pharmacologic comfort measures Outcome: Progressing   Problem: Health Behavior/Discharge Planning: Goal: Ability to manage health-related needs will improve Outcome: Progressing   Problem: Clinical Measurements: Goal: Ability to maintain clinical measurements within normal limits will improve Outcome: Progressing Goal: Will remain free from infection Outcome: Progressing Goal: Diagnostic test results will improve Outcome: Progressing Goal: Respiratory complications will improve Outcome: Progressing Goal: Cardiovascular complication will be avoided Outcome: Progressing   Problem: Activity: Goal: Risk for activity intolerance will decrease Outcome: Progressing   Problem: Nutrition: Goal: Adequate nutrition will be  maintained Outcome: Progressing   Problem: Coping: Goal: Level of anxiety will decrease Outcome: Progressing   Problem: Elimination: Goal: Will not experience complications related to bowel motility Outcome: Progressing Goal: Will not experience complications related to urinary retention Outcome: Progressing   Problem: Pain Managment: Goal: General experience of comfort will improve Outcome: Progressing   Problem: Safety: Goal: Ability to remain free from injury will improve Outcome: Progressing   Problem: Skin Integrity: Goal: Risk for impaired skin integrity will decrease Outcome: Progressing   Problem: Education: Goal: Ability to demonstrate management of disease process will improve Outcome: Progressing Goal: Ability to verbalize understanding of medication therapies will improve Outcome: Progressing Goal: Individualized Educational Video(s) Outcome: Progressing   Problem: Activity: Goal: Capacity to carry out activities will improve Outcome: Progressing   Problem: Cardiac: Goal: Ability to achieve and maintain adequate cardiopulmonary perfusion will improve Outcome: Progressing

## 2023-03-30 NOTE — Progress Notes (Signed)
Echocardiogram 2D Echocardiogram has been performed.  Cindy Holmes 03/30/2023, 9:51 AM

## 2023-03-30 NOTE — Assessment & Plan Note (Signed)
Continue metoprolol. 

## 2023-03-30 NOTE — TOC Initial Note (Signed)
Transition of Care [facility]) - Initial/Assessment Note    Patient Details  Name: Cindy Holmes MRN: [id number] Date of Birth: September 17, 1950  Transition of Care [facility]) CM/SW Contact:    Karn Cassis, LCSW Phone Number: 03/30/2023, 11:17 AM  Clinical Narrative:  Pt admitted for CHF exacerbation. Assessment completed due to high risk readmission score and CHF screening. Pt reports she lives with her husband. He assists with ADLs and is with her around the clock. Pt is active with [facility] HHPT/RN. Clifton Custard with [facility] notified of admission. Will need home health resumption orders prior to d/c. Pt plans to return home when medically stable.  CHF screening completed. Pt states she "tries to" weight herself daily, follow heart healthy diet, and take medications as prescribed. LCSW added CHF education to AVS and will continue [facility] for continued education.                  Expected Discharge Plan: Home w Home Health Services Barriers to Discharge: Continued Medical Work up   Patient Goals and CMS Choice Patient states their goals for this hospitalization and ongoing recovery are:: return home   Choice offered to / list presented to : Patient [place] ownership interest in [facility].provided to::  (n/a)    Expected Discharge Plan and Services In-house Referral: Clinical Social Work   Post Acute Care Choice: Resumption of Svcs/PTA Provider Living arrangements for the past 2 months: Single Family Home                           HH Arranged: RN, PT HH Agency: [facility] Date [facility] Agency Contacted: 03/30/23 Time HH Agency Contacted: [phone] Representative spoke with at [facility] Agency: Clifton Custard  Prior Living Arrangements/Services Living arrangements for the past 2 months: Single Family Home Lives with:: Spouse Patient language and need for interpreter reviewed:: Yes Do you feel safe going back to the place where you live?: Yes        Care giver support system in  place?: Yes (comment) Current home services: DME, Home PT, Home RN (walker, wheelchair) Criminal Activity/Legal Involvement Pertinent to Current Situation/Hospitalization: No - Comment as needed  Activities of Daily Living Home Assistive Devices/Equipment: Wheelchair ADL Screening (condition at time of admission) Patient's cognitive ability adequate to safely complete daily activities?: Yes Is the patient deaf or have difficulty hearing?: Yes Does the patient have difficulty seeing, even when wearing glasses/contacts?: No Does the patient have difficulty concentrating, remembering, or making decisions?: No Patient able to express need for assistance with ADLs?: Yes Does the patient have difficulty dressing or bathing?: No Independently performs ADLs?: Yes (appropriate for developmental age) Does the patient have difficulty walking or climbing stairs?: Yes Weakness of Legs: Both Weakness of Arms/Hands: Both  Permission Sought/Granted   Permission granted to share information with : Yes, Verbal Permission Granted     Permission granted to share info w AGENCY: [facility]  Permission granted to share info w Relationship: home health     Emotional Assessment     Affect (typically observed): Appropriate Orientation: : Oriented to Self, Oriented to Place, Oriented to  Time, Oriented to Situation Alcohol / Substance Use: Not Applicable Psych Involvement: No (comment)  Admission diagnosis:  CHF exacerbation (HCC) [I50.9] Hypervolemia, unspecified hypervolemia type [E87.70] Patient Active Problem List   Diagnosis Date Noted   CHF exacerbation (HCC) 03/30/2023   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) 03/06/2023   Wheelchair dependent 03/06/2023   Morbid obesity with body mass index (BMI) of 40.0 to 49.9 (HCC) 03/02/2023   Diarrhea 02/02/2023   Senile osteoporosis 08/10/2022   Diverticulitis 10/26/2020   PAF (paroxysmal atrial  fibrillation) (HCC) 10/26/2020   Acute diverticulitis 10/26/2020   Nausea vomiting and diarrhea    Closed displaced trimalleolar fracture of right ankle 05/28/2018   Fall at home, initial encounter 05/17/2018   Multiple fractures 05/14/2018   Fracture of distal end of right radius 05/14/2018   Tibial plateau fracture, right, closed, initial encounter 05/14/2018   Candida esophagitis (HCC) 12/03/2016   Hypokalemia 12/02/2016   Lactic acidosis 12/02/2016   AKI (acute kidney injury) (HCC) 12/02/2016   Chronic diastolic CHF (congestive heart failure) (HCC) 12/02/2016   Coronary artery disease involving native coronary artery of native heart without angina pectoris 11/13/2015   Chest pain 11/13/2015   Hyperlipemia 11/13/2015   Acute on chronic diastolic CHF (congestive heart failure), NYHA class 3 (HCC) 11/13/2015   Pain in joint, shoulder region 01/16/2014   Muscle weakness (generalized) 01/16/2014   Decreased range of motion of right shoulder 01/16/2014   Right rotator cuff tear 11/16/2013   Rotator cuff tear 11/16/2013   Rheumatoid arthritis (HCC)    Essential hypertension    Hyperlipidemia    Stroke [facility])    Emotional stress reaction    Leg pain    Carotid artery disease (HCC)    Overweight 11/04/2008   EDEMA 11/04/2008   PCP:  [name], MD Pharmacy:   [facility] [phone] - [place], [place] - [address] [facility] [address] [place] [address] Phone: [phone] Fax: [phone]  [facility]) - [place], [place] - [address] [facility] [address] [place] [address] [place] [address] Phone: [phone] Fax: [phone]  [facility] Delivery - [place], [place] - [address] [place] [address] Phone: [phone] Fax: [phone]  [facility] [address] [place] [address] Phone: [phone] Fax:  [phone]     Social Determinants of Health (SDOH) Social History: SDOH Screenings   Food Insecurity: No Food Insecurity (03/30/2023)  Housing: Low Risk  (03/30/2023)  Transportation Needs: No Transportation Needs (03/30/2023)  Utilities: Not At Risk (03/30/2023)  Tobacco Use: Low Risk  (03/30/2023)   SDOH Interventions:     Readmission Risk Interventions    03/30/2023   11:13 AM 03/03/2023    3:06 PM 02/03/2023    3:58 PM  Readmission Risk Prevention Plan  Transportation Screening Complete Complete Complete  PCP or Specialist Appt within 3-5 Days  Complete Complete  HRI or Home Care Consult Complete Complete Complete  Social Work Consult for Recovery Care Planning/Counseling Complete Complete Complete  Palliative Care Screening Not Applicable Not Applicable Not Applicable  Medication Review [employer]) Complete Referral to Pharmacy Referral to Pharmacy

## 2023-03-30 NOTE — Plan of Care (Signed)
Problem: Education: Goal: Understanding of discharge needs will improve Outcome: Progressing Goal: Verbalization of understanding of the causes of altered bowel function will improve Outcome: Progressing   Problem: Activity: Goal: Ability to tolerate increased activity will improve Outcome: Progressing   Problem: Bowel/Gastric: Goal: Gastrointestinal status for postoperative course will improve Outcome: Progressing

## 2023-03-30 NOTE — Progress Notes (Signed)
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  CTA was negative for PE but showed right and left-sided pleural effusion   Assessment & Plan:  Principal Problem:   CHF exacerbation (HCC) Active Problems:   Essential hypertension   Hyperlipemia   Hypokalemia   PAF (paroxysmal atrial fibrillation) (HCC)   Acute congestive heart failure with preserved ejection fraction, EF 65%.  Grade 2 DD. CT showing bilateral effusion with clinical evidence of volume overload.  Continue IV diuretics.  Monitor electrolytes Echocardiogram August 2024 showed EF 70%, grade 2 DD   PAF (paroxysmal atrial fibrillation) (HCC) - Continue metoprolol, continue Eliquis   Hypokalemia Replete aggressively.  Check magnesium and phosphorus   Hyperlipemia - Continue statin   Essential hypertension - Continue metoprolol.  IV as needed  Anemia of chronic disease - Hemoglobin at baseline.  Continue to monitor.  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  Follows [facility], in between switching cards.    Examination:  General exam: Appears calm and comfortable  Respiratory system: Clear to auscultation. Respiratory effort normal. Cardiovascular system: S1 & S2 heard, RRR. No JVD, murmurs, rubs, gallops or clicks. 1 + b/l EL edema Gastrointestinal system: Abdomen is nondistended, soft and nontender. No organomegaly or masses felt. Normal bowel  sounds heard. Central nervous system: Alert and oriented. No focal neurological deficits. Extremities: Symmetric 5 x 5 power. Skin: No rashes, lesions or ulcers Psychiatry: Judgement and insight appear normal. Mood & affect appropriate.      Diet Orders (From admission, onward)     Start     Ordered   03/30/23 0334  Diet Heart Room service appropriate? Yes; Fluid consistency: Thin; Fluid restriction: 1500 mL Fluid  Diet effective now       Question Answer Comment  Room service appropriate? Yes   Fluid consistency: Thin   Fluid restriction: 1500 mL Fluid      03/30/23 0333            Objective: Vitals:   03/30/23 0130 03/30/23 0215 03/30/23 0305 03/30/23 0533  BP: 104/60 (!) 113/57 112/84 (!) 109/56  Pulse: (!) 110 (!) 102 (!) 110 (!) 105  Resp: 19 (!) 22 20   Temp:   98.2 F (36.8 C)   TempSrc:      SpO2: 97% 97% 96%   Weight:    110 kg    Intake/Output Summary (Last 24 hours) at 03/30/2023 0840 Last data filed at 03/30/2023 0500 Gross per 24 hour  Intake 420 ml  Output 300 ml  Net 120 ml   Filed Weights   03/29/23 2100 03/30/23 0533  Weight: 117 kg 110 kg    Scheduled Meds:  apixaban  5 mg Oral BID   aspirin EC  81 mg Oral QHS   atorvastatin  80 mg Oral Daily   ezetimibe  10 mg Oral Daily   furosemide  60 mg Intravenous BID   magnesium oxide  800 mg Oral Once   metoprolol tartrate  12.5 mg Oral BID   potassium  chloride  40 mEq Oral Once   Continuous Infusions:  Nutritional status     Body mass index is 42.96 kg/m.  Data Reviewed:   CBC: Recent Labs  Lab 03/29/23 2134 03/30/23 0434  WBC 7.1 6.4  NEUTROABS  --  4.3  HGB 8.1* 7.3*  HCT 25.6* 23.4*  MCV 90.8 92.5  PLT 440* 394   Basic Metabolic Panel: Recent Labs  Lab 03/29/23 2134 03/30/23 0434  NA 131* 130*  K 2.9* 2.8*  CL 96* 93*  CO2 25 25  GLUCOSE 107* 98  BUN 8 8  CREATININE 0.80 0.79  CALCIUM 7.8* 7.6*  MG  --  1.7   GFR: Estimated Creatinine Clearance: 76.8 mL/min  (by C-G formula based on SCr of 0.79 mg/dL). Liver Function Tests: Recent Labs  Lab 03/29/23 2134 03/30/23 0434  AST 11* 9*  ALT 9 10  ALKPHOS 75 63  BILITOT 0.6 0.6  PROT 5.5* 5.1*  ALBUMIN 2.4* 2.6*   No results for input(s): "LIPASE", "AMYLASE" in the last 168 hours. No results for input(s): "AMMONIA" in the last 168 hours. Coagulation Profile: No results for input(s): "INR", "PROTIME" in the last 168 hours. Cardiac Enzymes: No results for input(s): "CKTOTAL", "CKMB", "CKMBINDEX", "TROPONINI" in the last 168 hours. BNP (last 3 results) Recent Labs    05/19/22 1204  PROBNP 380*   HbA1C: No results for input(s): "HGBA1C" in the last 72 hours. CBG: No results for input(s): "GLUCAP" in the last 168 hours. Lipid Profile: No results for input(s): "CHOL", "HDL", "LDLCALC", "TRIG", "CHOLHDL", "LDLDIRECT" in the last 72 hours. Thyroid Function Tests: No results for input(s): "TSH", "T4TOTAL", "FREET4", "T3FREE", "THYROIDAB" in the last 72 hours. Anemia Panel: No results for input(s): "VITAMINB12", "FOLATE", "FERRITIN", "TIBC", "IRON", "RETICCTPCT" in the last 72 hours. Sepsis Labs: No results for input(s): "PROCALCITON", "LATICACIDVEN" in the last 168 hours.  No results found for this or any previous visit (from the past 240 hour(s)).       Radiology Studies: CT Angio Chest PE W and/or Wo Contrast  Result Date: 03/30/2023 CLINICAL DATA:  Fluid retention. Trouble breathing. History of CHF. Pitting edema in lower extremities. EXAM: CT ANGIOGRAPHY CHEST WITH CONTRAST TECHNIQUE: Multidetector CT imaging of the chest was performed using the standard protocol during bolus administration of intravenous contrast. Multiplanar CT image reconstructions and MIPs were obtained to evaluate the vascular anatomy. RADIATION DOSE REDUCTION: This exam was performed according to the departmental dose-optimization program which includes automated exposure control, adjustment of the mA and/or kV  according to patient size and/or use of iterative reconstruction technique. CONTRAST:  75mL OMNIPAQUE IOHEXOL 350 MG/ML SOLN COMPARISON:  Radiograph 03/29/2023 and report from CT chest 05/14/2018 FINDINGS: Cardiovascular: Negative for acute pulmonary embolism. No pericardial effusion. Coronary artery and aortic atherosclerotic calcification. Cardiomegaly. Mediastinum/Nodes: Trachea is unremarkable. Unremarkable esophagus. No thoracic adenopathy. Lungs/Pleura: Mosaic attenuation of the lungs compatible with air trapping. Bibasilar atelectasis or infiltrates. Mild bronchial wall thickening. Small left and trace right pleural effusions Upper Abdomen: No acute abnormality.  Gastric band. Musculoskeletal: No acute fracture. 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 Review of the MIP images confirms the above findings. IMPRESSION: 1. Negative for acute pulmonary embolism. 2. Findings compatible with bronchitis/reactive airways. 3. Small left and trace right pleural effusions. Aortic Atherosclerosis (ICD10-I70.0). Electronically Signed   By: Minerva Fester M.D.   On: 03/30/2023 00:10   DG Chest Port 1 View  Result Date: 03/29/2023 CLINICAL DATA:  Short of breath, lower extremity  edema EXAM: PORTABLE CHEST 1 VIEW COMPARISON:  03/05/2023 FINDINGS: Single frontal view of the chest demonstrates stable enlargement of the cardiac silhouette. Scattered areas of subsegmental atelectasis or scarring are seen at the lung bases. No airspace disease, effusion, or pneumothorax. No acute bony abnormalities. IMPRESSION: 1. Stable enlarged cardiac silhouette. 2. No acute intrathoracic process. Electronically Signed   By: Sharlet Salina M.D.   On: 03/29/2023 21:53           LOS: 0 days   Time spent= 35 mins    Miguel Rota, MD [facility]  If 7PM-7AM, please contact night-coverage  03/30/2023, 8:40 AM

## 2023-03-30 NOTE — ED Provider Notes (Signed)
[place] EMERGENCY DEPARTMENT AT [facility] Provider Note  CSN: [id number] Arrival date & time: 03/29/23 2051  Chief Complaint(s) Shortness of Breath  HPI Cindy Holmes is a 72 y.o. 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  She did have volume overload throughout this hospital admission and it was recommended that she goes to a SNF but patient requesting discharge home with home health.  She states that she has been attempting to diurese with her oral torsemide at home but she is having worsening lower extremity edema and orthopnea.  Denies associated chest pain, headache, fever or other systemic symptoms.   Past Medical History Past Medical History:  Diagnosis Date   CAD (coronary artery disease)    a. PTCA LAD 2001/cath 2002-prox and mid LAD stents patent, PTCA ostium Dx 1 b. cath 03/2016: patent LAD stent with less than 40% in-stent restenosis, 10-30% stenosis of D1 and distal LAD with continued medical management recommended. 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   Immunosuppression due to drug  therapy (HCC) 03/06/2023   Wheelchair dependent 03/06/2023   Morbid obesity with body mass index (BMI) of 40.0 to 49.9 (HCC) 03/02/2023   Diarrhea 02/02/2023   Senile osteoporosis 08/10/2022   Diverticulitis 10/26/2020   PAF (paroxysmal atrial fibrillation) (HCC) 10/26/2020   Acute diverticulitis 10/26/2020   Nausea vomiting and diarrhea    Closed displaced trimalleolar fracture of right ankle 05/28/2018   Fall at home, initial encounter 05/17/2018   Multiple fractures 05/14/2018   Fracture of distal end of right radius 05/14/2018   Tibial plateau fracture, right, closed, initial encounter 05/14/2018   Candida esophagitis (HCC) 12/03/2016   Hypokalemia 12/02/2016   Lactic acidosis 12/02/2016   AKI (acute kidney injury) (HCC) 12/02/2016   Chronic diastolic CHF (congestive heart failure) (HCC) 12/02/2016   Coronary artery disease involving native coronary artery of native heart without angina pectoris 11/13/2015   Chest pain 11/13/2015   Hyperlipemia 11/13/2015   Acute on chronic diastolic CHF (congestive heart failure), NYHA class 3 (HCC) 11/13/2015   Pain in joint, shoulder region 01/16/2014   Muscle weakness (generalized) 01/16/2014   Decreased range of motion of right shoulder 01/16/2014   Right rotator cuff tear 11/16/2013   Rotator cuff tear 11/16/2013   Rheumatoid arthritis (HCC)    Essential hypertension    Hyperlipidemia    Stroke [facility])    Emotional stress reaction    Leg pain    Carotid artery disease (HCC)    Overweight 11/04/2008   EDEMA 11/04/2008   Home Medication(s) Prior to Admission  medications   Medication Sig Start Date End Date Taking? Authorizing Provider  acetaminophen (TYLENOL) 500 MG tablet Take 1,000 mg by mouth every 6 (six) hours as needed for headache (pain).    [provider]  acidophilus (RISAQUAD) CAPS capsule Take 2 capsules by mouth daily. 02/05/23   Joseph Art, DO  allopurinol (ZYLOPRIM) 100 MG tablet Take 1 tablet (100 mg  total) by mouth daily. Resume this at 100 mg daily follow with your PCP to titrate this back up as needed. 03/21/23   Zigmund Daniel., MD  apixaban (ELIQUIS) 5 MG TABS tablet Take 1 tablet (5 mg total) by mouth 2 (two) times daily. 12/10/20   Meriam Sprague, MD  aspirin EC 81 MG tablet Take 81 mg by mouth at bedtime. Swallow whole.    [provider]  atorvastatin (LIPITOR) 40 MG tablet Take 2 tablets (80 mg total) by mouth daily 12/10/20   Filbert Schilder, NP  diphenoxylate-atropine (LOMOTIL) 2.5-0.025 MG tablet Take 1 tablet by mouth 4 (four) times daily as needed for diarrhea or loose stools. 02/22/23   [provider]  ezetimibe (ZETIA) 10 MG tablet Take 1 tablet (10 mg total) by mouth daily. 12/10/20   Georgie Chard D, NP  ferrous sulfate 325 (65 FE) MG tablet Take 1 tablet (325 mg total) by mouth every other day. Follow with your PCP to follow your iron deficiency outpatient. 03/22/23 10/07/23  Zigmund Daniel., MD  folic acid (FOLVITE) 1 MG tablet Take 1 mg by mouth at bedtime. 03/05/14   [provider]  hydroxychloroquine (PLAQUENIL) 200 MG tablet Take 200 mg by mouth at bedtime. 09/03/20   [provider]  loperamide (IMODIUM) 2 MG capsule Take 1 capsule (2 mg total) by mouth as needed for diarrhea or loose stools. 02/05/23   Joseph Art, DO  methotrexate (RHEUMATREX) 2.5 MG tablet Take 10 mg by mouth every Sunday. 08/08/20   [provider]  metoprolol tartrate (LOPRESSOR) 25 MG tablet Take 0.5 tablets (12.5 mg total) by mouth 2 (two) times daily. 03/21/23 05/20/23  Zigmund Daniel., MD  Omega-3 Fatty Acids (FISH OIL) 1000 MG CAPS Take 1,000 mg by mouth at bedtime.    [provider]  ondansetron (ZOFRAN) 4 MG tablet Take 4 mg by mouth every 8 (eight) hours as needed for vomiting or nausea. 02/11/23   [provider]  potassium chloride SA (KLOR-CON M) 20 MEQ tablet Take 1 tablet (20 mEq total) by mouth 2 (two) times  daily for 14 days. Repeat labs with your PCP within 7 days 03/21/23 04/04/23  Zigmund Daniel., MD  torsemide (DEMADEX) 20 MG tablet Take 1 tablet (20 mg total) by mouth 2 (two) times daily. Follow with PCP and cardiology for refills. 03/21/23 04/20/23  Zigmund Daniel., MD  zolpidem (AMBIEN) 5 MG tablet Take 5 mg by mouth at bedtime. 10/13/20   [provider]  Past Surgical History Past Surgical History:  Procedure Laterality Date   BIOPSY  03/04/2023   Procedure: BIOPSY;  Surgeon: Jeani Hawking, MD;  Location[facility] ENDOSCOPY;  Service: Gastroenterology;;   BOWEL RESECTION  03/08/2023   Procedure: SMALL BOWEL RESECTION;  Surgeon: Harriette Bouillon, MD;  Location: [facility] OR;  Service: General;;   CARDIAC CATHETERIZATION     with stent placement in 2001   CARDIAC CATHETERIZATION N/A 03/12/2016   Procedure: Left Heart Cath and Coronary Angiography;  Surgeon: Yvonne Kendall, MD;  Location: [facility] INVASIVE CV LAB;  Service: Cardiovascular;  Laterality: N/A;   CARDIAC CATHETERIZATION N/A 03/12/2016   Procedure: Intravascular Pressure Wire/FFR Study;  Surgeon: Yvonne Kendall, MD;  Location: [facility] INVASIVE CV LAB;  Service: Cardiovascular;  Laterality: N/A;   CHOLECYSTECTOMY     COLECTOMY WITH COLOSTOMY CREATION/HARTMANN PROCEDURE N/A 03/08/2023   Procedure: COLECTOMY WITH COLOSTOMY CREATION/HARTMANN PROCEDURE;  Surgeon: Harriette Bouillon, MD;  Location: [facility] OR;  Service: General;  Laterality: N/A;   COLONOSCOPY WITH PROPOFOL N/A 03/04/2023   Procedure: COLONOSCOPY WITH PROPOFOL;  Surgeon: Jeani Hawking, MD;  Location: [facility] ENDOSCOPY;  Service: Gastroenterology;  Laterality: N/A;   CORONARY ANGIOPLASTY     2002   ESOPHAGOGASTRODUODENOSCOPY N/A 12/03/2016   Procedure: ESOPHAGOGASTRODUODENOSCOPY (EGD);  Surgeon: Jeani Hawking, MD;  Location: [facility] ENDOSCOPY;  Service: Endoscopy;   Laterality: N/A;   LAPAROTOMY N/A 03/08/2023   Procedure: EXPLORATION LAPAROTOMY;  Surgeon: Harriette Bouillon, MD;  Location: [facility] OR;  Service: General;  Laterality: N/A;   laproscopic adjustable gastric  banding     with APS standard system.    OPEN REDUCTION INTERNAL FIXATION (ORIF) DISTAL RADIAL FRACTURE Right 05/15/2018   Procedure: OPEN REDUCTION INTERNAL FIXATION (ORIF) DISTAL RADIAL FRACTURE;  Surgeon: Roby Lofts, MD;  Location: [facility] OR;  Service: Orthopedics;  Laterality: Right;   ORIF ANKLE FRACTURE Right 05/15/2018   Procedure: OPEN REDUCTION INTERNAL FIXATION (ORIF) ANKLE FRACTURE;  Surgeon: Roby Lofts, MD;  Location: [facility] OR;  Service: Orthopedics;  Laterality: Right;   ORIF TIBIA PLATEAU Right 05/15/2018   Procedure: OPEN REDUCTION INTERNAL FIXATION (ORIF) TIBIAL PLATEAU;  Surgeon: Roby Lofts, MD;  Location: [facility] OR;  Service: Orthopedics;  Laterality: Right;   Pt has 3 stents     sept 2001   SHOULDER ARTHROSCOPY WITH SUBACROMIAL DECOMPRESSION, ROTATOR CUFF REPAIR AND BICEP TENDON REPAIR Right 11/16/2013   Procedure: RIGHT SHOULDER ARTHROSCOPY, EXTENSIVE DEBRIDEMENT, ROTATOR CUFF REPAIR ;  Surgeon: Eulas Post, MD;  Location: [facility];  Service: Orthopedics;  Laterality: Right;   TUBAL LIGATION     Family History Family History  Problem Relation Age of Onset   Heart attack Mother    Heart disease Mother    Arthritis/Rheumatoid Father    Cirrhosis Father        hepatic cirrhosis   Breast cancer Sister 88   Diabetes Sister    Heart disease Maternal Aunt    Heart attack Maternal Aunt    Throat cancer Maternal Aunt    Heart attack Maternal Uncle    Colon cancer Maternal Grandmother    Throat cancer Maternal Grandfather    Diabetes Brother    Heart disease Brother    Heart disease Brother    Cancer Other        family history   Heart attack Other        family history    Social History Social History   Tobacco Use   Smoking status: Never    Smokeless tobacco:  Never  Vaping Use   Vaping status: Never Used  Substance Use Topics   Alcohol use: No    Alcohol/week: 0.0 standard drinks of alcohol   Drug use: No   Allergies Codeine phosphate, Morphine and codeine, Amoxicillin, Penicillins, Methotrexate, and Lidocaine  Review of Systems Review of Systems  Respiratory:  Positive for shortness of breath.   Cardiovascular:  Positive for leg swelling.    Physical Exam Vital Signs  I have reviewed the triage vital signs BP 120/68   Pulse (!) 106   Temp 97.9 F (36.6 C) (Oral)   Resp 16   Wt 117 kg   SpO2 95%   BMI 45.69 kg/m   Physical Exam Vitals and nursing note reviewed.  Constitutional:      General: She is not in acute distress.    Appearance: She is well-developed.  HENT:     Head: Normocephalic and atraumatic.  Eyes:     Conjunctiva/sclera: Conjunctivae normal.  Cardiovascular:     Rate and Rhythm: Normal rate and regular rhythm.     Heart sounds: No murmur heard. Pulmonary:     Effort: Pulmonary effort is normal. No respiratory distress.     Breath sounds: Rales present.  Abdominal:     Palpations: Abdomen is soft.     Tenderness: There is no abdominal tenderness.  Musculoskeletal:        General: No swelling.     Cervical back: Neck supple.     Right lower leg: Edema present.     Left lower leg: Edema present.  Skin:    General: Skin is warm and dry.     Capillary Refill: Capillary refill takes less than 2 seconds.  Neurological:     Mental Status: She is alert.  Psychiatric:        Mood and Affect: Mood normal.     ED Results and Treatments Labs (all labs ordered are listed, but only abnormal results are displayed) Labs Reviewed  CBC - Abnormal; Notable for the following components:      Result Value   RBC 2.82 (*)    Hemoglobin 8.1 (*)    HCT 25.6 (*)    RDW 15.9 (*)    Platelets 440 (*)    All other components within normal limits  BRAIN NATRIURETIC PEPTIDE - Abnormal; Notable  for the following components:   B Natriuretic Peptide 152.0 (*)    All other components within normal limits  COMPREHENSIVE METABOLIC PANEL - Abnormal; Notable for the following components:   Sodium 131 (*)    Potassium 2.9 (*)    Chloride 96 (*)    Glucose, Bld 107 (*)    Calcium 7.8 (*)    Total Protein 5.5 (*)    Albumin 2.4 (*)    AST 11 (*)    All other components within normal limits  TROPONIN I (HIGH SENSITIVITY)  TROPONIN I (HIGH SENSITIVITY)  Radiology CT Angio Chest PE W and/or Wo Contrast  Result Date: 03/30/2023 CLINICAL DATA:  Fluid retention. Trouble breathing. History of CHF. Pitting edema in lower extremities. EXAM: CT ANGIOGRAPHY CHEST WITH CONTRAST TECHNIQUE: Multidetector CT imaging of the chest was performed using the standard protocol during bolus administration of intravenous contrast. Multiplanar CT image reconstructions and MIPs were obtained to evaluate the vascular anatomy. RADIATION DOSE REDUCTION: This exam was performed according to the departmental dose-optimization program which includes automated exposure control, adjustment of the mA and/or kV according to patient size and/or use of iterative reconstruction technique. CONTRAST:  75mL OMNIPAQUE IOHEXOL 350 MG/ML SOLN COMPARISON:  Radiograph 03/29/2023 and report from CT chest 05/14/2018 FINDINGS: Cardiovascular: Negative for acute pulmonary embolism. No pericardial effusion. Coronary artery and aortic atherosclerotic calcification. Cardiomegaly. Mediastinum/Nodes: Trachea is unremarkable. Unremarkable esophagus. No thoracic adenopathy. Lungs/Pleura: Mosaic attenuation of the lungs compatible with air trapping. Bibasilar atelectasis or infiltrates. Mild bronchial wall thickening. Small left and trace right pleural effusions Upper Abdomen: No acute abnormality.  Gastric band. Musculoskeletal: No  acute fracture. 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 Review of the MIP images confirms the above findings. IMPRESSION: 1. Negative for acute pulmonary embolism. 2. Findings compatible with bronchitis/reactive airways. 3. Small left and trace right pleural effusions. Aortic Atherosclerosis (ICD10-I70.0). Electronically Signed   By: Minerva Fester M.D.   On: 03/30/2023 00:10   DG Chest Port 1 View  Result Date: 03/29/2023 CLINICAL DATA:  Short of breath, lower extremity edema EXAM: PORTABLE CHEST 1 VIEW COMPARISON:  03/05/2023 FINDINGS: Single frontal view of the chest demonstrates stable enlargement of the cardiac silhouette. Scattered areas of subsegmental atelectasis or scarring are seen at the lung bases. No airspace disease, effusion, or pneumothorax. No acute bony abnormalities. IMPRESSION: 1. Stable enlarged cardiac silhouette. 2. No acute intrathoracic process. Electronically Signed   By: Sharlet Salina M.D.   On: 03/29/2023 21:53    Pertinent labs & imaging results that were available during my care of the patient were reviewed by me and considered in my medical decision making (see MDM for details).  Medications Ordered in ED Medications  magnesium oxide (MAG-OX) tablet 800 mg (800 mg Oral Patient Refused/Not Given 03/29/23 2328)  albumin human 25 % solution 25 g (0 g Intravenous Hold 03/29/23 2358)  furosemide (LASIX) injection 40 mg (40 mg Intravenous Given 03/29/23 2318)  potassium chloride 10 mEq in 100 mL IVPB (10 mEq Intravenous New Bag/Given 03/29/23 2321)  iohexol (OMNIPAQUE) 350 MG/ML injection 75 mL (75 mLs Intravenous Contrast Given 03/29/23 2332)  magnesium sulfate IVPB 1 g 100 mL (1 g Intravenous New Bag/Given 03/29/23 2355)                                                                                                                                     Procedures Procedures  (including critical care time)  Medical Decision Making / ED  Course   This patient presents to the ED for concern of shortness of breath, leg swelling, this involves an extensive number of treatment options, and is a complaint that carries with it a high risk of complications and morbidity.  The differential diagnosis includes hypervolemia, failure of outpatient diuretic regimen, CHF exacerbation, pleural effusion, pneumonia, PE  MDM: Patient seen emergency room for evaluation of leg swelling and orthopnea.  Physical exam with 2+ pitting edema bilaterally, rales at bilateral bases.  Laboratory evaluation with hemoglobin of 8.1, BNP 152, potassium 2.9, sodium 131, albumin 2.4, high-sensitivity troponin normal.  Chest x-ray with no acute findings.  CT PE negative for PE but does show small bilateral pleural effusions.  Patient require hospital admission for continued volume overload.  Obesity likely contributing to falsely low BNP.  Diuresis begun with IV Lasix.  Patient admitted.   Additional history obtained: -Additional history obtained from husband -External records from outside source obtained and reviewed including: Chart review including previous notes, labs, imaging, consultation notes   Lab Tests: -I ordered, reviewed, and interpreted labs.   The pertinent results include:   Labs Reviewed  CBC - Abnormal; Notable for the following components:      Result Value   RBC 2.82 (*)    Hemoglobin 8.1 (*)    HCT 25.6 (*)    RDW 15.9 (*)    Platelets 440 (*)    All other components within normal limits  BRAIN NATRIURETIC PEPTIDE - Abnormal; Notable for the following components:   B Natriuretic Peptide 152.0 (*)    All other components within normal limits  COMPREHENSIVE METABOLIC PANEL - Abnormal; Notable for the following components:   Sodium 131 (*)    Potassium 2.9 (*)    Chloride 96 (*)    Glucose, Bld 107 (*)    Calcium 7.8 (*)    Total Protein 5.5 (*)    Albumin 2.4 (*)    AST 11 (*)    All other components within normal limits   TROPONIN I (HIGH SENSITIVITY)  TROPONIN I (HIGH SENSITIVITY)      EKG   EKG Interpretation Date/Time:  Tuesday March 29 2023 21:05:24 EDT Ventricular Rate:  113 PR Interval:  134 QRS Duration:  80 QT Interval:  331 QTC Calculation: 454 R Axis:   60  Text Interpretation: Sinus tachycardia with irregular rate Low voltage, precordial leads Nonspecific repol abnormality, diffuse leads Confirmed by Ariq Khamis ([id number]) on 03/30/2023 1:02:39 AM         Imaging Studies ordered: I ordered imaging studies including chest x-ray, CT PE I independently visualized and interpreted imaging. I agree with the radiologist interpretation   Medicines ordered and prescription drug management: Meds ordered this encounter  Medications   furosemide (LASIX) injection 40 mg   DISCONTD: potassium chloride SA (KLOR-CON M) CR tablet 40 mEq   magnesium oxide (MAG-OX) tablet 800 mg   albumin human 25 % solution 25 g   potassium chloride 10 mEq in 100 mL IVPB   iohexol (OMNIPAQUE) 350 MG/ML injection 75 mL   magnesium sulfate IVPB 1 g 100 mL    -I have reviewed the patients home medicines and have made adjustments as needed  Critical interventions none   Cardiac Monitoring: The patient was maintained on a cardiac monitor.  I personally viewed and interpreted the cardiac monitored which showed an underlying rhythm of: Sinus tachycardia  Social Determinants of Health:  Factors impacting patients care include: Home health   Reevaluation: After the  interventions noted above, I reevaluated the patient and found that they have :improved  Co morbidities that complicate the patient evaluation  Past Medical History:  Diagnosis Date   CAD (coronary artery disease)    a. PTCA LAD 2001/cath 2002-prox and mid LAD stents patent, PTCA ostium Dx 1 b. cath 03/2016: patent LAD stent with less than 40% in-stent restenosis, 10-30% stenosis of D1 and distal LAD with continued medical management  recommended. 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      Dispostion: I considered admission for this patient, and given failure of outpatient diuretics and need for IV diuretics in the setting of fluid overload patient require hospital admission     Final Clinical Impression(s) / ED Diagnoses Final diagnoses:  Hypervolemia, unspecified hypervolemia type     @PCDICTATION @    Glendora Score, MD 03/30/23 0105

## 2023-03-30 NOTE — ED Notes (Signed)
Attempted to give the potassium drink to the patient. Patient stated she is unable to tolerate it.

## 2023-03-30 NOTE — Assessment & Plan Note (Signed)
-   Continue metoprolol, continue Eliquis

## 2023-03-30 NOTE — Assessment & Plan Note (Signed)
-   Potassium down to 2.9 - Replace and recheck

## 2023-03-30 NOTE — Assessment & Plan Note (Signed)
-   Continue aspirin, statin, beta-blocker - Fluid overloaded - Reports taking torsemide at home, while it is appearing that she is prescribed 20 mg of torsemide twice daily, patient reports that she has been taking up to 120 mg of torsemide without adequate effect - Received 40 mg of Lasix in the ED - Continue with 60 mg of Lasix twice daily - Last echo was last month and showed ejection fraction of 70% with grade 2 diastolic dysfunction - Monitor intake and output - Reds clip - Patient has cardiomegaly on chest x-ray, dyspnea on exertion, orthopnea, peripheral edema - CTA also shows right and left pleural effusions but negative for PE - Continue to monitor

## 2023-03-30 NOTE — Assessment & Plan Note (Signed)
Continue statin. 

## 2023-03-30 NOTE — H&P (Signed)
History and Physical    Patient: Cindy Holmes GLO:[id number] DOB: 02-12-1951 DOA: 03/29/2023 DOS: the patient was seen and examined on 03/30/2023 PCP: [name], MD  Patient coming from: Home  Chief Complaint:  Chief Complaint  Patient presents with   Shortness of Breath   HPI: Cindy Holmes is a 72 y.o. 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  Patient reports that she had a colostomy 3 or 4 weeks ago.  She reports she was sick for 6 months before that.  She had diverticulitis and that is why the end of doing a colostomy.  She reports she was sent home from that hospitalization fluid overloaded.  She has been taking torsemide but she continues to be more and more fluid overloaded.  She is not having dyspnea, palpitations, orthopnea.  She reports a chest discomfort but she will not say chest pain.  She reports that she is taking up to 120 mg of torsemide without adequate effect.  She thinks she has had good urine output since her first dose of Lasix in the ER, but none since she has been up to the floor.  Patient ambulates with a wheelchair.  She reports she has been wheelchair-bound over 1 year.  Patient denies any cough, dysuria, skin changes.  She does have some left upper quadrant pain on exam that she reports is not normal since her surgery.  She has had no change in her ostomy output.  Will get a KUB to further evaluate the left upper quadrant pain.  Patient reports that she is DNR. Review of Systems: As mentioned in the history of present illness. All other systems reviewed and are negative. Past Medical History:  Diagnosis Date   CAD (coronary artery disease)    a. PTCA LAD 2001/cath 2002-prox and mid LAD stents patent, PTCA ostium Dx 1 b. cath 03/2016: patent LAD stent with less than 40% in-stent  restenosis, 10-30% stenosis of D1 and distal LAD with continued medical management recommended. 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   Past Surgical History:  Procedure Laterality Date   BIOPSY  03/04/2023   Procedure: BIOPSY;  Surgeon: Jeani Hawking, MD;  Location: [facility] ENDOSCOPY;  Service: Gastroenterology;;   BOWEL RESECTION  03/08/2023   Procedure: SMALL BOWEL RESECTION;  Surgeon: Harriette Bouillon, MD;  Location: [facility] OR;  Service: General;;   CARDIAC CATHETERIZATION     with stent placement in 2001   CARDIAC CATHETERIZATION N/A 03/12/2016   Procedure: Left Heart Cath and Coronary Angiography;  Surgeon: Yvonne Kendall, MD;  [facility] IN[facility] CV LAB;  Service: Cardiovascular;  Laterality: N/A;   CARDIAC CATHETERIZATION N/A 03/12/2016   Procedure: Intravascular Pressure Wire/FFR Study;  Surgeon: Yvonne Kendall, MD;  Location[facility] INVASIVE CV LAB;  Service: Cardiovascular;  Laterality: N/A;   CHOLECYSTECTOMY     COLECTOMY WITH COLOSTOMY CREATION/HARTMANN PROCEDURE N/A 03/08/2023   Procedure: COLECTOMY WITH COLOSTOMY CREATION/HARTMANN PROCEDURE;  Surgeon: Harriette Bouillon, MD;  Location: [facility] OR;  Service: General;  Laterality: N/A;   COLONOSCOPY WITH PROPOFOL N/A 03/04/2023   Procedure: COLONOSCOPY WITH PROPOFOL;  Surgeon: Jeani Hawking, MD;  Location: [facility] ENDOSCOPY;  Service: Gastroenterology;  Laterality: N/A;   CORONARY ANGIOPLASTY     2002   ESOPHAGOGASTRODUODENOSCOPY N/A  12/03/2016   Procedure: ESOPHAGOGASTRODUODENOSCOPY (EGD);  Surgeon: Jeani Hawking, MD;  Location: [facility] ENDOSCOPY;  Service: Endoscopy;  Laterality: N/A;   LAPAROTOMY N/A 03/08/2023   Procedure: EXPLORATION LAPAROTOMY;  Surgeon: Harriette Bouillon, MD;  Location: [facility] OR;  Service: General;  Laterality: N/A;   laproscopic adjustable gastric  banding     with APS standard system.    OPEN REDUCTION INTERNAL FIXATION (ORIF) DISTAL RADIAL FRACTURE Right 05/15/2018   Procedure: OPEN REDUCTION INTERNAL FIXATION (ORIF) DISTAL RADIAL FRACTURE;  Surgeon: Roby Lofts, MD;  Location: [facility] OR;  Service: Orthopedics;  Laterality: Right;   ORIF ANKLE FRACTURE Right 05/15/2018   Procedure: OPEN REDUCTION INTERNAL FIXATION (ORIF) ANKLE FRACTURE;  Surgeon: Roby Lofts, MD;  Location: [facility] OR;  Service: Orthopedics;  Laterality: Right;   ORIF TIBIA PLATEAU Right 05/15/2018   Procedure: OPEN REDUCTION INTERNAL FIXATION (ORIF) TIBIAL PLATEAU;  Surgeon: Roby Lofts, MD;  Location: [facility] OR;  Service: Orthopedics;  Laterality: Right;   Pt has 3 stents     sept 2001   SHOULDER ARTHROSCOPY WITH SUBACROMIAL DECOMPRESSION, ROTATOR CUFF REPAIR AND BICEP TENDON REPAIR Right 11/16/2013   Procedure: RIGHT SHOULDER ARTHROSCOPY, EXTENSIVE DEBRIDEMENT, ROTATOR CUFF REPAIR ;  Surgeon: Eulas Post, MD;  Location: [facility];  Service: Orthopedics;  Laterality: Right;   TUBAL LIGATION     Social History:  reports that she has never smoked. She has never used smokeless tobacco. She reports that she does not drink alcohol and does not use drugs.  Allergies  Allergen Reactions   Codeine Phosphate Shortness Of Breath   Morphine And Codeine Nausea And Vomiting   Amoxicillin Nausea And Vomiting   Penicillins Nausea And Vomiting    Has patient had a PCN reaction causing immediate rash, facial/tongue/throat swelling, SOB or lightheadedness with hypotension:YES Has patient had a PCN reaction causing severe rash involving  mucus membranes or skin necrosis: NO Has patient had a PCN reaction that required hospitalization NO Has patient had a PCN reaction occurring within the last 10 years: NO If all of the above answers are "NO", then may proceed with Cephalosporin use.   Methotrexate Nausea Only   Lidocaine Rash    Reaction to lidocaine patch    Family History  Problem Relation Age of Onset   Heart attack Mother    Heart disease Mother    Arthritis/Rheumatoid Father    Cirrhosis Father        hepatic cirrhosis   Breast cancer Sister 14   Diabetes Sister    Heart disease Maternal Aunt    Heart attack Maternal Aunt    Throat cancer Maternal Aunt    Heart attack Maternal Uncle    Colon cancer Maternal Grandmother    Throat cancer Maternal Grandfather    Diabetes Brother    Heart disease Brother    Heart disease Brother    Cancer Other        family history   Heart attack Other  family history    Prior to Admission medications   Medication Sig Start Date End Date Taking? Authorizing Provider  acetaminophen (TYLENOL) 500 MG tablet Take 1,000 mg by mouth every 6 (six) hours as needed for headache (pain).    [provider]  acidophilus (RISAQUAD) CAPS capsule Take 2 capsules by mouth daily. 02/05/23   Joseph Art, DO  allopurinol (ZYLOPRIM) 100 MG tablet Take 1 tablet (100 mg total) by mouth daily. Resume this at 100 mg daily follow with your PCP to titrate this back up as needed. 03/21/23   Zigmund Daniel., MD  apixaban (ELIQUIS) 5 MG TABS tablet Take 1 tablet (5 mg total) by mouth 2 (two) times daily. 12/10/20   Meriam Sprague, MD  aspirin EC 81 MG tablet Take 81 mg by mouth at bedtime. Swallow whole.    [provider]  atorvastatin (LIPITOR) 40 MG tablet Take 2 tablets (80 mg total) by mouth daily 12/10/20   Filbert Schilder, NP  diphenoxylate-atropine (LOMOTIL) 2.5-0.025 MG tablet Take 1 tablet by mouth 4 (four) times daily as needed for diarrhea or loose stools.  02/22/23   [provider]  ezetimibe (ZETIA) 10 MG tablet Take 1 tablet (10 mg total) by mouth daily. 12/10/20   Georgie Chard D, NP  ferrous sulfate 325 (65 FE) MG tablet Take 1 tablet (325 mg total) by mouth every other day. Follow with your PCP to follow your iron deficiency outpatient. 03/22/23 10/07/23  Zigmund Daniel., MD  folic acid (FOLVITE) 1 MG tablet Take 1 mg by mouth at bedtime. 03/05/14   [provider]  hydroxychloroquine (PLAQUENIL) 200 MG tablet Take 200 mg by mouth at bedtime. 09/03/20   [provider]  loperamide (IMODIUM) 2 MG capsule Take 1 capsule (2 mg total) by mouth as needed for diarrhea or loose stools. 02/05/23   Joseph Art, DO  methotrexate (RHEUMATREX) 2.5 MG tablet Take 10 mg by mouth every Sunday. 08/08/20   [provider]  metoprolol tartrate (LOPRESSOR) 25 MG tablet Take 0.5 tablets (12.5 mg total) by mouth 2 (two) times daily. 03/21/23 05/20/23  Zigmund Daniel., MD  Omega-3 Fatty Acids (FISH OIL) 1000 MG CAPS Take 1,000 mg by mouth at bedtime.    [provider]  ondansetron (ZOFRAN) 4 MG tablet Take 4 mg by mouth every 8 (eight) hours as needed for vomiting or nausea. 02/11/23   [provider]  potassium chloride SA (KLOR-CON M) 20 MEQ tablet Take 1 tablet (20 mEq total) by mouth 2 (two) times daily for 14 days. Repeat labs with your PCP within 7 days 03/21/23 04/04/23  Zigmund Daniel., MD  torsemide (DEMADEX) 20 MG tablet Take 1 tablet (20 mg total) by mouth 2 (two) times daily. Follow with PCP and cardiology for refills. 03/21/23 04/20/23  Zigmund Daniel., MD  zolpidem (AMBIEN) 5 MG tablet Take 5 mg by mouth at bedtime. 10/13/20   [provider]    Physical Exam: Vitals:   03/30/23 0130 03/30/23 0215 03/30/23 0305 03/30/23 0533  BP: 104/60 (!) 113/57 112/84 (!) 109/56  Pulse: (!) 110 (!) 102 (!) 110 (!) 105  Resp: 19 (!) 22 20   Temp:   98.2 F (36.8 C)   TempSrc:      SpO2:  97% 97% 96%   Weight:    110 kg   1.  General: Patient lying supine in bed,  no acute distress  2. Psychiatric: Alert and oriented x 3, mood and behavior normal for situation, pleasant and cooperative with exam   3. Neurologic: Speech and language are normal, face is symmetric, moves all 4 extremities voluntarily, at baseline without acute deficits on limited exam   4. HEENMT:  Head is atraumatic, normocephalic, pupils reactive to light, neck is supple, trachea is midline, mucous membranes are moist   5. Respiratory : Lungs are clear to auscultation bilaterally without wheezing, rhonchi, rales, no cyanosis, no increase in work of breathing or accessory muscle use   6. Cardiovascular : Heart rate normal, rhythm is irregular, no murmurs, rubs or gallops, significant peripheral edema, peripheral pulses palpated   7. Gastrointestinal:  Abdomen is soft, nondistended, nontender to palpation bowel sounds active, no masses or organomegaly palpated   8. Skin:  Skin is warm, dry and intact without rashes, acute lesions, or ulcers on limited exam   9.Musculoskeletal:  No acute deformities or trauma, no asymmetry in tone, significant peripheral edema, peripheral pulses palpated, no tenderness to palpation in the extremities  Data Reviewed: In the ED Temp 97.9, heart rate 106/117, respiratory rate 16-23, blood pressure 109/56-121/71, satting 95-97% No leukocytosis with white blood cell count of 7.1, hemoglobin 8.1, platelets 440 Hypokalemic at 2.9 CTA is negative for PE but does show left and right pleural effusions Chest x-ray shows cardiomegaly Admission requested for diuresis  Assessment and Plan: * CHF exacerbation (HCC) - Continue aspirin, statin, beta-blocker - Fluid overloaded - Reports taking torsemide at home, while it is appearing that she is prescribed 20 mg of torsemide twice daily, patient reports that she has been taking up to 120 mg of torsemide without adequate  effect - Received 40 mg of Lasix in the ED - Continue with 60 mg of Lasix twice daily - Last echo was last month and showed ejection fraction of 70% with grade 2 diastolic dysfunction - Monitor intake and output - Reds clip - Patient has cardiomegaly on chest x-ray, dyspnea on exertion, orthopnea, peripheral edema - CTA also shows right and left pleural effusions but negative for PE - Continue to monitor  PAF (paroxysmal atrial fibrillation) (HCC) - Continue metoprolol, continue Eliquis  Hypokalemia - Potassium down to 2.9 - Replace and recheck  Hyperlipemia - Continue statin  Essential hypertension - Continue metoprolol      Advance Care Planning:   Code Status: Limited: Do not attempt resuscitation (DNR) -DNR-LIMITED -Do Not Intubate/DNI   Consults: None at this time  Family Communication: Husband at bedside  Severity of Illness: The appropriate patient status for this patient is INPATIENT. Inpatient status is judged to be reasonable and necessary in order to provide the required intensity of service to ensure the patient's safety. The patient's presenting symptoms, physical exam findings, and initial radiographic and laboratory data in the context of their chronic comorbidities is felt to place them at high risk for further clinical deterioration. Furthermore, it is not anticipated that the patient will be medically stable for discharge from the hospital within 2 midnights of admission.   * I certify that at the point of admission it is my clinical judgment that the patient will require inpatient hospital care spanning beyond 2 midnights from the point of admission due to high intensity of service, high risk for further deterioration and high frequency of surveillance required.*  Author: Lilyan Gilford, DO 03/30/2023 6:07 AM  For on call review [email or web address].

## 2023-03-30 NOTE — Plan of Care (Signed)
  Problem: Education: Goal: Understanding of discharge needs will improve Outcome: Progressing   Problem: Health Behavior/Discharge Planning: Goal: Identification of community resources to assist with postoperative recovery needs will improve Outcome: Progressing

## 2023-03-30 NOTE — Hospital Course (Signed)
  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  CTA was negative for PE but showed right and left-sided pleural effusion. Echo showed preserved 70%. Currently in IV lasix and improving.      Assessment & Plan:  Principal Problem:   CHF exacerbation (HCC) Active Problems:   Essential hypertension   Hyperlipemia   Hypokalemia   PAF (paroxysmal atrial fibrillation) (HCC)   Acute congestive heart failure with preserved ejection fraction, EF 65%.  Grade 2 DD. CT showing bilateral effusion with clinical evidence of volume overload.  Continue IV diuretics.  Monitor electrolytes Echocardiogram August 2024 showed EF 70%, grade 2 DD, not sure why it was repeated but appears stable.  Spoke with Dr Jenene Slicker from cards, she was able to get her an outpatient Appt on Oct 7th    PAF (paroxysmal atrial fibrillation) (HCC) - Continue metoprolol, continue Eliquis   Hypokalemia Replete aggressively.  Check magnesium and phosphorus   Hyperlipemia - Continue statin   Essential hypertension - Continue metoprolol.  IV as needed   Anemia of chronic disease - Hemoglobin at baseline.  Continue to monitor.   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

## 2023-03-30 NOTE — Progress Notes (Addendum)
This nurse went in to administer potassium pills to pt. Pt refused because she said she couldn't swallow pills, I offered to put them in liquid with applesauce and she still refused ands said she wanted a smaller pill. Messaged MD, an order for potassium packet was made. Pt still refused and said she would throw up. Pt was educated. MD made aware.

## 2023-03-30 NOTE — Progress Notes (Signed)
PT Cancellation Note  Patient Details Name: Cindy Holmes MRN: [id number] DOB: 1951/05/26   Cancelled Treatment:    Reason Eval/Treat Not Completed: Patient declined, no reason specified  Pt hesitant for mobility at this time due to concerns of potential diagnoses and wanting more fluid off her before mobility. Pt was educated on movement as means of assistance for fluid retention. Pt continued with hesitancy for PT evaluation at this time.   PT will re-attempt evaluation as schedule permits.   Nelida Meuse 03/30/2023, 2:38 PM

## 2023-03-31 DIAGNOSIS — I5033 Acute on chronic diastolic (congestive) heart failure: Secondary | ICD-10-CM | POA: Diagnosis not present

## 2023-03-31 LAB — BASIC METABOLIC PANEL
Anion gap: 9 (ref 5–15)
BUN: 7 mg/dL — ABNORMAL LOW (ref 8–23)
CO2: 25 mmol/L (ref 22–32)
Calcium: 7.7 mg/dL — ABNORMAL LOW (ref 8.9–10.3)
Chloride: 93 mmol/L — ABNORMAL LOW (ref 98–111)
Creatinine, Ser: 0.71 mg/dL (ref 0.44–1.00)
GFR, Estimated: >60 mL/min (ref >60)
Glucose, Bld: 99 mg/dL (ref 70–99)
Potassium: 3.6 mmol/L (ref 3.5–5.1)
Sodium: 127 mmol/L — ABNORMAL LOW (ref 135–145)

## 2023-03-31 LAB — CBC
HCT: 23.6 % — ABNORMAL LOW (ref 36.0–46.0)
Hemoglobin: 7.5 g/dL — ABNORMAL LOW (ref 12.0–15.0)
MCH: 28.8 pg (ref 26.0–34.0)
MCHC: 31.8 g/dL (ref 30.0–36.0)
MCV: 90.8 fL (ref 80.0–100.0)
Platelets: 397 10*3/uL (ref 150–400)
RBC: 2.6 MIL/uL — ABNORMAL LOW (ref 3.87–5.11)
RDW: 15.9 % — ABNORMAL HIGH (ref 11.5–15.5)
WBC: 7.3 10*3/uL (ref 4.0–10.5)
nRBC: 0 % (ref 0.0–0.2)

## 2023-03-31 LAB — MAGNESIUM: Magnesium: 1.5 mg/dL — ABNORMAL LOW (ref 1.7–2.4)

## 2023-03-31 LAB — PHOSPHORUS: Phosphorus: 3.1 mg/dL (ref 2.5–4.6)

## 2023-03-31 MED ORDER — POTASSIUM CHLORIDE 20 MEQ PO PACK: 40.0000 meq | PACK | Freq: Once | ORAL | Status: DC

## 2023-03-31 MED ORDER — MAGNESIUM SULFATE 2 GM/50ML IV SOLN
2.0000 g | Freq: Once | INTRAVENOUS | Status: AC
  Administered 2023-03-31: 2 g via INTRAVENOUS
  Filled 2023-03-31: qty 50

## 2023-03-31 NOTE — Consult Note (Signed)
[facility]  [facility] Inpatient Consult   03/31/2023  ESTELLER CAMBER Feb 23, 1951 [id number]  Primary Care Provider: [name], MD is with [facility]  Location:  [facility] Liaison screened the patient remotely at [facility]. Coverage for [facility] Liaison  Insurance: Medicare ACO [place]   The patient was screened for 30 day readmission hospitalization with noted high risk score for unplanned readmission risk with 3 admissions in 6 months.  The patient was assessed for potential [facility]) Care Management service needs for post hospital transition for care coordination. Review of patient's electronic medical record reveals patient is for home with home health, admitted with volume overload with HX of HF on 03/30/23 with multiple chronic PMH. Patient's electronic medical record was reviewed for potential needs and reveals patient has recently offered post hospital follow up and declined, call to hospital with operator assistance and nursing station but unable to reach patient.  Plan:  Referral request for community care coordination: Will request a follow up for readmission prevention follow up support.   [facility] Care Management/Population Health does not replace or interfere with any arrangements made by the Inpatient Transition of Care team.   For questions contact:   Charlesetta Shanks, RN, BSN, CCM [place]  [facility], [facility] Liaison Direct Dial: [phone] or secure chat Website: [email or web address]

## 2023-03-31 NOTE — Progress Notes (Signed)
Pt transferred home by EMS. NO tele or IV noted on pt at time of transfer.

## 2023-03-31 NOTE — TOC Transition Note (Signed)
Transition of Care [facility]) - CM/SW Discharge Note   Patient Details  Name: Cindy Holmes MRN: [id number] Date of Birth: May 18, 1951  Transition of Care [facility]) CM/SW Contact:  Karn Cassis, LCSW Phone Number: 03/31/2023, 12:10 PM   Clinical Narrative: Pt d/c today. Clifton Custard with [facility] notified. HHPT/RN orders are in.       Final next level of care: Home w Home Health Services Barriers to Discharge: Barriers Resolved   Patient Goals and CMS Choice   Choice offered to / list presented to : Patient  Discharge Placement                         Discharge Plan and Services Additional resources added to the After Visit Summary for   In-house Referral: Clinical Social Work   Post Acute Care Choice: Resumption of Svcs/PTA Provider                    HH Arranged: RN, PT [facility] Agency: [facility] Date [facility] Agency Contacted: 03/30/23 Time HH Agency Contacted: [phone] Representative spoke with at [facility] Agency: Clifton Custard  Social Determinants of Health (SDOH) Interventions SDOH Screenings   Food Insecurity: No Food Insecurity (03/30/2023)  Housing: Low Risk  (03/30/2023)  Transportation Needs: No Transportation Needs (03/30/2023)  Utilities: Not At Risk (03/30/2023)  Tobacco Use: Low Risk  (03/30/2023)     Readmission Risk Interventions    03/30/2023   11:13 AM 03/03/2023    3:06 PM 02/03/2023    3:58 PM  Readmission Risk Prevention Plan  Transportation Screening Complete Complete Complete  PCP or Specialist Appt within 3-5 Days  Complete Complete  HRI or Home Care Consult Complete Complete Complete  Social Work Consult for Recovery Care Planning/Counseling Complete Complete Complete  Palliative Care Screening Not Applicable Not Applicable Not Applicable  Medication Review Oceanographer) Complete Referral to Pharmacy Referral to Pharmacy

## 2023-03-31 NOTE — Progress Notes (Addendum)
Pt refused morning dose of potassium. Pt was educated on importance of taking this medication even though her potassium level is at a normal range. Pt states, "I want the smaller pills and I know they have it because I talked to pharmacy and they told me they had them and I also got them when I was a [facility]. I took the packer last night and I gagged every time I put it in my mouth and I'm not doing it again". MD is aware of pt's refusal. Will continue to monitor.

## 2023-03-31 NOTE — Progress Notes (Signed)
Patient took one of the prescribed doses of PO potassium, but stated she would not take the next. Patient stated it makes her throw up. Patient educated on importance of taking PO potassium and she still refused. Dr. Thomes Dinning notified of situation and IV potassium ordered. IV potassium started.

## 2023-03-31 NOTE — Care Management Important Message (Signed)
Important Message  Patient Details  Name: Cindy Holmes MRN: [id number] Date of Birth: 1951/02/06   Medicare Important Message Given:  N/A - LOS <3 / Initial given by admissions     Corey Harold 03/31/2023, 10:16 AM

## 2023-03-31 NOTE — Discharge Summary (Signed)
IMPRESSION: Mild vascular congestion stable from the prior exam. Electronically Signed   By: Alcide Clever M.D.   On: 03/05/2023 19:05   US RENAL  Result Date: 03/04/2023 CLINICAL DATA:  Renal lesion. EXAM: RENAL / URINARY TRACT ULTRASOUND COMPLETE COMPARISON:  Noncontrast CT 03/03/2023 and older CTs FINDINGS: Right Kidney: Renal measurements: 9.4 x 4.6 x 5.4 cm = volume: 122.3 mL. Echogenicity within normal limits. No mass or hydronephrosis visualized. Left Kidney: Renal measurements: 9.1 x 5.4 x 5.2 cm = volume: 132.5 mL. Mild left-sided renal atrophy, parenchymal loss. No collecting system dilatation or perinephric fluid. Bladder: Bladder is underdistended.  Grossly preserved contour. Other: None. IMPRESSION: No collecting system dilatation. Mild global atrophy of the left kidney. Of note no specific renal lesion identified. Overall, recommend either MRI evaluation or follow up CT scan in 6 months Electronically Signed   By: Karen Kays M.D.   On: 03/04/2023  18:39   CT ABDOMEN PELVIS WO CONTRAST  Result Date: 03/03/2023 CLINICAL DATA:  Abdominal pain EXAM: CT ABDOMEN AND PELVIS WITHOUT CONTRAST TECHNIQUE: Multidetector CT imaging of the abdomen and pelvis was performed following the standard protocol without IV contrast. RADIATION DOSE REDUCTION: This exam was performed according to the departmental dose-optimization program which includes automated exposure control, adjustment of the mA and/or kV according to patient size and/or use of iterative reconstruction technique. COMPARISON:  CT abdomen and pelvis 02/02/2023 FINDINGS: Lower chest: No acute abnormality. Hepatobiliary: No focal liver abnormality is seen. Status post cholecystectomy. No biliary dilatation. Pancreas: Unremarkable. No pancreatic ductal dilatation or surrounding inflammatory changes. Spleen: Normal in size without focal abnormality. Adrenals/Urinary Tract: There is a rounded hypodensity in the superior pole the right kidney measuring less than 1 cm, unchanged. There is no hydronephrosis or urinary tract calculus. The adrenal glands and bladder are within normal limits. Stomach/Bowel: Stomach is within normal limits. Gastric band in place. Appendix appears normal. No acute inflammation, pneumatosis or free air. There some subtle questionable wall thickening of the mid sigmoid colon measuring 5.5 cm in length on image 6/63. Vascular/Lymphatic: Aortic atherosclerosis. No enlarged abdominal or pelvic lymph nodes. Reproductive: Uterus and bilateral adnexa are unremarkable. Other: No abdominal wall hernia or abnormality. There is mild body wall edema. No abdominopelvic ascites. Musculoskeletal: Compression deformities of L2, L3 and L4 appear stable. IMPRESSION: 1. Questionable wall thickening of the mid sigmoid colon. Correlate clinically for colitis. Neoplasm can not be excluded. Recommend further evaluation with colonoscopy. 2. Mild body wall edema. 3. Stable hypodense lesion in the right kidney,  indeterminate. This can be further evaluated with ultrasound or MRI. 4. Gastric band in place. No bowel obstruction. 5. Aortic atherosclerosis. Aortic Atherosclerosis (ICD10-I70.0). Electronically Signed   By: Darliss Cheney M.D.   On: 03/03/2023 23:08   VAS [place] LOWER EXTREMITY VENOUS (DVT)  Result Date: 03/03/2023  Lower Venous DVT Study Patient Name:  Cindy Holmes [facility]  Date of Exam:   03/03/2023 Medical Rec #: [id number]        Accession #:    [id number] Date of Birth: 20-Mar-1951       Patient Gender: F Patient Age:   72 years Exam Location:  [facility] Procedure:      VAS [place] LOWER EXTREMITY VENOUS (DVT) Referring Phys: Kathlen Mody --------------------------------------------------------------------------------  Indications: Swelling.  Comparison Study: No previous study. Performing Technologist: McKayla Maag RVT, [place]  Examination Guidelines: A complete evaluation includes B-mode imaging, spectral Doppler, color Doppler, and power Doppler as needed of all accessible portions of each  IMPRESSION: Mild vascular congestion stable from the prior exam. Electronically Signed   By: Alcide Clever M.D.   On: 03/05/2023 19:05   US RENAL  Result Date: 03/04/2023 CLINICAL DATA:  Renal lesion. EXAM: RENAL / URINARY TRACT ULTRASOUND COMPLETE COMPARISON:  Noncontrast CT 03/03/2023 and older CTs FINDINGS: Right Kidney: Renal measurements: 9.4 x 4.6 x 5.4 cm = volume: 122.3 mL. Echogenicity within normal limits. No mass or hydronephrosis visualized. Left Kidney: Renal measurements: 9.1 x 5.4 x 5.2 cm = volume: 132.5 mL. Mild left-sided renal atrophy, parenchymal loss. No collecting system dilatation or perinephric fluid. Bladder: Bladder is underdistended.  Grossly preserved contour. Other: None. IMPRESSION: No collecting system dilatation. Mild global atrophy of the left kidney. Of note no specific renal lesion identified. Overall, recommend either MRI evaluation or follow up CT scan in 6 months Electronically Signed   By: Karen Kays M.D.   On: 03/04/2023  18:39   CT ABDOMEN PELVIS WO CONTRAST  Result Date: 03/03/2023 CLINICAL DATA:  Abdominal pain EXAM: CT ABDOMEN AND PELVIS WITHOUT CONTRAST TECHNIQUE: Multidetector CT imaging of the abdomen and pelvis was performed following the standard protocol without IV contrast. RADIATION DOSE REDUCTION: This exam was performed according to the departmental dose-optimization program which includes automated exposure control, adjustment of the mA and/or kV according to patient size and/or use of iterative reconstruction technique. COMPARISON:  CT abdomen and pelvis 02/02/2023 FINDINGS: Lower chest: No acute abnormality. Hepatobiliary: No focal liver abnormality is seen. Status post cholecystectomy. No biliary dilatation. Pancreas: Unremarkable. No pancreatic ductal dilatation or surrounding inflammatory changes. Spleen: Normal in size without focal abnormality. Adrenals/Urinary Tract: There is a rounded hypodensity in the superior pole the right kidney measuring less than 1 cm, unchanged. There is no hydronephrosis or urinary tract calculus. The adrenal glands and bladder are within normal limits. Stomach/Bowel: Stomach is within normal limits. Gastric band in place. Appendix appears normal. No acute inflammation, pneumatosis or free air. There some subtle questionable wall thickening of the mid sigmoid colon measuring 5.5 cm in length on image 6/63. Vascular/Lymphatic: Aortic atherosclerosis. No enlarged abdominal or pelvic lymph nodes. Reproductive: Uterus and bilateral adnexa are unremarkable. Other: No abdominal wall hernia or abnormality. There is mild body wall edema. No abdominopelvic ascites. Musculoskeletal: Compression deformities of L2, L3 and L4 appear stable. IMPRESSION: 1. Questionable wall thickening of the mid sigmoid colon. Correlate clinically for colitis. Neoplasm can not be excluded. Recommend further evaluation with colonoscopy. 2. Mild body wall edema. 3. Stable hypodense lesion in the right kidney,  indeterminate. This can be further evaluated with ultrasound or MRI. 4. Gastric band in place. No bowel obstruction. 5. Aortic atherosclerosis. Aortic Atherosclerosis (ICD10-I70.0). Electronically Signed   By: Darliss Cheney M.D.   On: 03/03/2023 23:08   VAS [place] LOWER EXTREMITY VENOUS (DVT)  Result Date: 03/03/2023  Lower Venous DVT Study Patient Name:  Cindy Holmes [facility]  Date of Exam:   03/03/2023 Medical Rec #: [id number]        Accession #:    [id number] Date of Birth: 20-Mar-1951       Patient Gender: F Patient Age:   72 years Exam Location:  [facility] Procedure:      VAS [place] LOWER EXTREMITY VENOUS (DVT) Referring Phys: Kathlen Mody --------------------------------------------------------------------------------  Indications: Swelling.  Comparison Study: No previous study. Performing Technologist: McKayla Maag RVT, [place]  Examination Guidelines: A complete evaluation includes B-mode imaging, spectral Doppler, color Doppler, and power Doppler as needed of all accessible portions of each  0.25 m MV A velocity: 107.00 cm/s  Systemic Diam: 2.00 cm MV E/A ratio:  1.06 Vishnu Priya Mallipeddi Electronically signed by Winfield Rast Mallipeddi Signature Date/Time: 03/30/2023/12:23:49 PM    Final    DG Abd 1 View  Result Date: 03/30/2023 CLINICAL DATA:  Left upper quadrant pain EXAM: ABDOMEN - 1 VIEW COMPARISON:  CT chest from previous day FINDINGS: Gastric lap band apparatus projects in expected location. Cholecystectomy clips. Residual contrast material in the renal collecting systems and urinary bladder. Normal bowel gas pattern. Regional bones unremarkable. IMPRESSION: Nonobstructive bowel gas pattern. Electronically Signed   By: Corlis Leak M.D.   On: 03/30/2023 11:53   CT Angio Chest PE W and/or Wo Contrast  Result Date: 03/30/2023 CLINICAL DATA:  Fluid retention. Trouble breathing. History of CHF. Pitting edema in lower extremities. EXAM: CT ANGIOGRAPHY CHEST WITH CONTRAST TECHNIQUE: Multidetector CT imaging of the chest was performed using the standard protocol during bolus administration of intravenous contrast. Multiplanar CT image reconstructions and MIPs were obtained to evaluate the vascular anatomy. RADIATION DOSE REDUCTION: This exam was performed according to the departmental dose-optimization program which includes automated exposure control, adjustment of the mA and/or kV according to patient size and/or use of iterative reconstruction technique. CONTRAST:  75mL OMNIPAQUE IOHEXOL 350 MG/ML SOLN COMPARISON:  Radiograph 03/29/2023 and report from CT chest 05/14/2018 FINDINGS: Cardiovascular: Negative for acute pulmonary embolism. No pericardial effusion. Coronary artery and aortic atherosclerotic calcification. Cardiomegaly. Mediastinum/Nodes: Trachea is unremarkable. Unremarkable esophagus. No thoracic adenopathy. Lungs/Pleura: Mosaic attenuation of the lungs compatible with air trapping. Bibasilar atelectasis or infiltrates. Mild bronchial wall thickening.  Small left and trace right pleural effusions Upper Abdomen: No acute abnormality.  Gastric band. Musculoskeletal: No acute fracture. 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 Review of the MIP images confirms the above findings. IMPRESSION: 1. Negative for acute pulmonary embolism. 2. Findings compatible with bronchitis/reactive airways. 3. Small left and trace right pleural effusions. Aortic Atherosclerosis (ICD10-I70.0). Electronically Signed   By: Minerva Fester M.D.   On: 03/30/2023 00:10   DG Chest Port 1 View  Result Date: 03/29/2023 CLINICAL DATA:  Short of breath, lower extremity edema EXAM: PORTABLE CHEST 1 VIEW COMPARISON:  03/05/2023 FINDINGS: Single frontal view of the chest demonstrates stable enlargement of the cardiac silhouette. Scattered areas of subsegmental atelectasis or scarring are seen at the lung bases. No airspace disease, effusion, or pneumothorax. No acute bony abnormalities. IMPRESSION: 1. Stable enlarged cardiac silhouette. 2. No acute intrathoracic process. Electronically Signed   By: Sharlet Salina M.D.   On: 03/29/2023 21:53   VAS [place] ABI WITH/WO TBI  Result Date: 03/13/2023  LOWER EXTREMITY DOPPLER STUDY Patient Name:  Cindy Holmes  Date of Exam:   03/13/2023 Medical Rec #: [id number]        Accession #:    [id number] Date of Birth: [birth year]       Patient Gender: F Patient Age:   46 years Exam Location:  [facility] Procedure:      VAS [place] ABI WITH/WO TBI Referring Phys: Dorcas Carrow --------------------------------------------------------------------------------  Indications: Cool left foot with Dopplerable pulses per ordering MD High Risk Factors: Hypertension, hyperlipidemia, coronary artery disease. 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 Paroxysmal atrial fibrillation.  Limitations: Today's exam was limited due to Edema, irregular heart beat, pain  0.25 m MV A velocity: 107.00 cm/s  Systemic Diam: 2.00 cm MV E/A ratio:  1.06 Vishnu Priya Mallipeddi Electronically signed by Winfield Rast Mallipeddi Signature Date/Time: 03/30/2023/12:23:49 PM    Final    DG Abd 1 View  Result Date: 03/30/2023 CLINICAL DATA:  Left upper quadrant pain EXAM: ABDOMEN - 1 VIEW COMPARISON:  CT chest from previous day FINDINGS: Gastric lap band apparatus projects in expected location. Cholecystectomy clips. Residual contrast material in the renal collecting systems and urinary bladder. Normal bowel gas pattern. Regional bones unremarkable. IMPRESSION: Nonobstructive bowel gas pattern. Electronically Signed   By: Corlis Leak M.D.   On: 03/30/2023 11:53   CT Angio Chest PE W and/or Wo Contrast  Result Date: 03/30/2023 CLINICAL DATA:  Fluid retention. Trouble breathing. History of CHF. Pitting edema in lower extremities. EXAM: CT ANGIOGRAPHY CHEST WITH CONTRAST TECHNIQUE: Multidetector CT imaging of the chest was performed using the standard protocol during bolus administration of intravenous contrast. Multiplanar CT image reconstructions and MIPs were obtained to evaluate the vascular anatomy. RADIATION DOSE REDUCTION: This exam was performed according to the departmental dose-optimization program which includes automated exposure control, adjustment of the mA and/or kV according to patient size and/or use of iterative reconstruction technique. CONTRAST:  75mL OMNIPAQUE IOHEXOL 350 MG/ML SOLN COMPARISON:  Radiograph 03/29/2023 and report from CT chest 05/14/2018 FINDINGS: Cardiovascular: Negative for acute pulmonary embolism. No pericardial effusion. Coronary artery and aortic atherosclerotic calcification. Cardiomegaly. Mediastinum/Nodes: Trachea is unremarkable. Unremarkable esophagus. No thoracic adenopathy. Lungs/Pleura: Mosaic attenuation of the lungs compatible with air trapping. Bibasilar atelectasis or infiltrates. Mild bronchial wall thickening.  Small left and trace right pleural effusions Upper Abdomen: No acute abnormality.  Gastric band. Musculoskeletal: No acute fracture. 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 Review of the MIP images confirms the above findings. IMPRESSION: 1. Negative for acute pulmonary embolism. 2. Findings compatible with bronchitis/reactive airways. 3. Small left and trace right pleural effusions. Aortic Atherosclerosis (ICD10-I70.0). Electronically Signed   By: Minerva Fester M.D.   On: 03/30/2023 00:10   DG Chest Port 1 View  Result Date: 03/29/2023 CLINICAL DATA:  Short of breath, lower extremity edema EXAM: PORTABLE CHEST 1 VIEW COMPARISON:  03/05/2023 FINDINGS: Single frontal view of the chest demonstrates stable enlargement of the cardiac silhouette. Scattered areas of subsegmental atelectasis or scarring are seen at the lung bases. No airspace disease, effusion, or pneumothorax. No acute bony abnormalities. IMPRESSION: 1. Stable enlarged cardiac silhouette. 2. No acute intrathoracic process. Electronically Signed   By: Sharlet Salina M.D.   On: 03/29/2023 21:53   VAS [place] ABI WITH/WO TBI  Result Date: 03/13/2023  LOWER EXTREMITY DOPPLER STUDY Patient Name:  Cindy Holmes  Date of Exam:   03/13/2023 Medical Rec #: [id number]        Accession #:    [id number] Date of Birth: [birth year]       Patient Gender: F Patient Age:   46 years Exam Location:  [facility] Procedure:      VAS [place] ABI WITH/WO TBI Referring Phys: Dorcas Carrow --------------------------------------------------------------------------------  Indications: Cool left foot with Dopplerable pulses per ordering MD High Risk Factors: Hypertension, hyperlipidemia, coronary artery disease. 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 Paroxysmal atrial fibrillation.  Limitations: Today's exam was limited due to Edema, irregular heart beat, pain  0.25 m MV A velocity: 107.00 cm/s  Systemic Diam: 2.00 cm MV E/A ratio:  1.06 Vishnu Priya Mallipeddi Electronically signed by Winfield Rast Mallipeddi Signature Date/Time: 03/30/2023/12:23:49 PM    Final    DG Abd 1 View  Result Date: 03/30/2023 CLINICAL DATA:  Left upper quadrant pain EXAM: ABDOMEN - 1 VIEW COMPARISON:  CT chest from previous day FINDINGS: Gastric lap band apparatus projects in expected location. Cholecystectomy clips. Residual contrast material in the renal collecting systems and urinary bladder. Normal bowel gas pattern. Regional bones unremarkable. IMPRESSION: Nonobstructive bowel gas pattern. Electronically Signed   By: Corlis Leak M.D.   On: 03/30/2023 11:53   CT Angio Chest PE W and/or Wo Contrast  Result Date: 03/30/2023 CLINICAL DATA:  Fluid retention. Trouble breathing. History of CHF. Pitting edema in lower extremities. EXAM: CT ANGIOGRAPHY CHEST WITH CONTRAST TECHNIQUE: Multidetector CT imaging of the chest was performed using the standard protocol during bolus administration of intravenous contrast. Multiplanar CT image reconstructions and MIPs were obtained to evaluate the vascular anatomy. RADIATION DOSE REDUCTION: This exam was performed according to the departmental dose-optimization program which includes automated exposure control, adjustment of the mA and/or kV according to patient size and/or use of iterative reconstruction technique. CONTRAST:  75mL OMNIPAQUE IOHEXOL 350 MG/ML SOLN COMPARISON:  Radiograph 03/29/2023 and report from CT chest 05/14/2018 FINDINGS: Cardiovascular: Negative for acute pulmonary embolism. No pericardial effusion. Coronary artery and aortic atherosclerotic calcification. Cardiomegaly. Mediastinum/Nodes: Trachea is unremarkable. Unremarkable esophagus. No thoracic adenopathy. Lungs/Pleura: Mosaic attenuation of the lungs compatible with air trapping. Bibasilar atelectasis or infiltrates. Mild bronchial wall thickening.  Small left and trace right pleural effusions Upper Abdomen: No acute abnormality.  Gastric band. Musculoskeletal: No acute fracture. 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 Review of the MIP images confirms the above findings. IMPRESSION: 1. Negative for acute pulmonary embolism. 2. Findings compatible with bronchitis/reactive airways. 3. Small left and trace right pleural effusions. Aortic Atherosclerosis (ICD10-I70.0). Electronically Signed   By: Minerva Fester M.D.   On: 03/30/2023 00:10   DG Chest Port 1 View  Result Date: 03/29/2023 CLINICAL DATA:  Short of breath, lower extremity edema EXAM: PORTABLE CHEST 1 VIEW COMPARISON:  03/05/2023 FINDINGS: Single frontal view of the chest demonstrates stable enlargement of the cardiac silhouette. Scattered areas of subsegmental atelectasis or scarring are seen at the lung bases. No airspace disease, effusion, or pneumothorax. No acute bony abnormalities. IMPRESSION: 1. Stable enlarged cardiac silhouette. 2. No acute intrathoracic process. Electronically Signed   By: Sharlet Salina M.D.   On: 03/29/2023 21:53   VAS [place] ABI WITH/WO TBI  Result Date: 03/13/2023  LOWER EXTREMITY DOPPLER STUDY Patient Name:  Cindy Holmes  Date of Exam:   03/13/2023 Medical Rec #: [id number]        Accession #:    [id number] Date of Birth: [birth year]       Patient Gender: F Patient Age:   46 years Exam Location:  [facility] Procedure:      VAS [place] ABI WITH/WO TBI Referring Phys: Dorcas Carrow --------------------------------------------------------------------------------  Indications: Cool left foot with Dopplerable pulses per ordering MD High Risk Factors: Hypertension, hyperlipidemia, coronary artery disease. 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 Paroxysmal atrial fibrillation.  Limitations: Today's exam was limited due to Edema, irregular heart beat, pain  IMPRESSION: Mild vascular congestion stable from the prior exam. Electronically Signed   By: Alcide Clever M.D.   On: 03/05/2023 19:05   US RENAL  Result Date: 03/04/2023 CLINICAL DATA:  Renal lesion. EXAM: RENAL / URINARY TRACT ULTRASOUND COMPLETE COMPARISON:  Noncontrast CT 03/03/2023 and older CTs FINDINGS: Right Kidney: Renal measurements: 9.4 x 4.6 x 5.4 cm = volume: 122.3 mL. Echogenicity within normal limits. No mass or hydronephrosis visualized. Left Kidney: Renal measurements: 9.1 x 5.4 x 5.2 cm = volume: 132.5 mL. Mild left-sided renal atrophy, parenchymal loss. No collecting system dilatation or perinephric fluid. Bladder: Bladder is underdistended.  Grossly preserved contour. Other: None. IMPRESSION: No collecting system dilatation. Mild global atrophy of the left kidney. Of note no specific renal lesion identified. Overall, recommend either MRI evaluation or follow up CT scan in 6 months Electronically Signed   By: Karen Kays M.D.   On: 03/04/2023  18:39   CT ABDOMEN PELVIS WO CONTRAST  Result Date: 03/03/2023 CLINICAL DATA:  Abdominal pain EXAM: CT ABDOMEN AND PELVIS WITHOUT CONTRAST TECHNIQUE: Multidetector CT imaging of the abdomen and pelvis was performed following the standard protocol without IV contrast. RADIATION DOSE REDUCTION: This exam was performed according to the departmental dose-optimization program which includes automated exposure control, adjustment of the mA and/or kV according to patient size and/or use of iterative reconstruction technique. COMPARISON:  CT abdomen and pelvis 02/02/2023 FINDINGS: Lower chest: No acute abnormality. Hepatobiliary: No focal liver abnormality is seen. Status post cholecystectomy. No biliary dilatation. Pancreas: Unremarkable. No pancreatic ductal dilatation or surrounding inflammatory changes. Spleen: Normal in size without focal abnormality. Adrenals/Urinary Tract: There is a rounded hypodensity in the superior pole the right kidney measuring less than 1 cm, unchanged. There is no hydronephrosis or urinary tract calculus. The adrenal glands and bladder are within normal limits. Stomach/Bowel: Stomach is within normal limits. Gastric band in place. Appendix appears normal. No acute inflammation, pneumatosis or free air. There some subtle questionable wall thickening of the mid sigmoid colon measuring 5.5 cm in length on image 6/63. Vascular/Lymphatic: Aortic atherosclerosis. No enlarged abdominal or pelvic lymph nodes. Reproductive: Uterus and bilateral adnexa are unremarkable. Other: No abdominal wall hernia or abnormality. There is mild body wall edema. No abdominopelvic ascites. Musculoskeletal: Compression deformities of L2, L3 and L4 appear stable. IMPRESSION: 1. Questionable wall thickening of the mid sigmoid colon. Correlate clinically for colitis. Neoplasm can not be excluded. Recommend further evaluation with colonoscopy. 2. Mild body wall edema. 3. Stable hypodense lesion in the right kidney,  indeterminate. This can be further evaluated with ultrasound or MRI. 4. Gastric band in place. No bowel obstruction. 5. Aortic atherosclerosis. Aortic Atherosclerosis (ICD10-I70.0). Electronically Signed   By: Darliss Cheney M.D.   On: 03/03/2023 23:08   VAS [place] LOWER EXTREMITY VENOUS (DVT)  Result Date: 03/03/2023  Lower Venous DVT Study Patient Name:  Cindy Holmes [facility]  Date of Exam:   03/03/2023 Medical Rec #: [id number]        Accession #:    [id number] Date of Birth: 20-Mar-1951       Patient Gender: F Patient Age:   72 years Exam Location:  [facility] Procedure:      VAS [place] LOWER EXTREMITY VENOUS (DVT) Referring Phys: Kathlen Mody --------------------------------------------------------------------------------  Indications: Swelling.  Comparison Study: No previous study. Performing Technologist: McKayla Maag RVT, [place]  Examination Guidelines: A complete evaluation includes B-mode imaging, spectral Doppler, color Doppler, and power Doppler as needed of all accessible portions of each  PERIPHERAL VASCULAR CATHETERIZATION  Result Date: 03/08/2023 See surgical note for result.  ECHOCARDIOGRAM COMPLETE  Result Date: 03/06/2023    ECHOCARDIOGRAM REPORT   Patient Name:   Cindy Holmes [facility] Date of Exam: 03/06/2023 Medical Rec #:  [id number]       Height:       63.0 in Accession #:    [id number]      Weight:       229.9 lb Date of Birth:  1950-08-02      BSA:          2.052 m Patient Age:    71 years        BP:           119/52 mmHg Patient Gender: F               HR:           79 bpm. Exam Location:  Inpatient Procedure: 2D Echo, Color Doppler and Cardiac Doppler Indications:    fluid overload  History:        Patient has prior history of Echocardiogram examinations, most                 recent 10/27/2020. CAD; Risk Factors:Hypertension and                  Dyslipidemia.  Sonographer:    Delcie Roch RDCS Referring Phys: Nilda Calamity AKULA IMPRESSIONS  1. Left ventricular ejection fraction, by estimation, is 65 to 70%. The left ventricle has normal function. The left ventricle has no regional wall motion abnormalities. Left ventricular diastolic parameters are consistent with Grade II diastolic dysfunction (pseudonormalization).  2. Right ventricular systolic function is normal. The right ventricular size is dilated. There is severely elevated pulmonary artery systolic pressure. The estimated right ventricular systolic pressure is 61.5 mmHg.  3. Left atrial size was mildly dilated.  4. The mitral valve is normal in structure. Mild to moderate mitral valve regurgitation.  5. Multiple jets. Tricuspid valve regurgitation is mild to moderate.  6. The aortic valve is tricuspid. Aortic valve regurgitation is not visualized. No aortic stenosis is present.  7. The inferior vena cava is dilated in size with <50% respiratory variability, suggesting right atrial pressure of 15 mmHg. Comparison(s): Prior images reviewed side by side. RV is dilated compared to prior on 4 chamber live views. RVSP has increased. FINDINGS  Left Ventricle: Left ventricular ejection fraction, by estimation, is 65 to 70%. The left ventricle has normal function. The left ventricle has no regional wall motion abnormalities. The left ventricular internal cavity size was normal in size. There is  no left ventricular hypertrophy. Left ventricular diastolic parameters are consistent with Grade II diastolic dysfunction (pseudonormalization). Right Ventricle: The right ventricular size is dilated. No increase in right ventricular wall thickness. Right ventricular systolic function is normal. There is severely elevated pulmonary artery systolic pressure. The tricuspid regurgitant velocity is 3.41 m/s, and with an assumed right atrial pressure of 15 mmHg, the estimated right ventricular systolic  pressure is 61.5 mmHg. Left Atrium: Left atrial size was mildly dilated. Right Atrium: Right atrial size was normal in size. Pericardium: Trivial pericardial effusion is present. The pericardial effusion is circumferential. Presence of epicardial fat layer. Mitral Valve: The mitral valve is normal in structure. Mild to moderate mitral valve regurgitation. Tricuspid Valve: Multiple jets. The tricuspid valve is normal in structure. Tricuspid valve regurgitation is mild to moderate. Aortic Valve: The aortic valve is tricuspid. Aortic valve regurgitation is  IMPRESSION: Mild vascular congestion stable from the prior exam. Electronically Signed   By: Alcide Clever M.D.   On: 03/05/2023 19:05   US RENAL  Result Date: 03/04/2023 CLINICAL DATA:  Renal lesion. EXAM: RENAL / URINARY TRACT ULTRASOUND COMPLETE COMPARISON:  Noncontrast CT 03/03/2023 and older CTs FINDINGS: Right Kidney: Renal measurements: 9.4 x 4.6 x 5.4 cm = volume: 122.3 mL. Echogenicity within normal limits. No mass or hydronephrosis visualized. Left Kidney: Renal measurements: 9.1 x 5.4 x 5.2 cm = volume: 132.5 mL. Mild left-sided renal atrophy, parenchymal loss. No collecting system dilatation or perinephric fluid. Bladder: Bladder is underdistended.  Grossly preserved contour. Other: None. IMPRESSION: No collecting system dilatation. Mild global atrophy of the left kidney. Of note no specific renal lesion identified. Overall, recommend either MRI evaluation or follow up CT scan in 6 months Electronically Signed   By: Karen Kays M.D.   On: 03/04/2023  18:39   CT ABDOMEN PELVIS WO CONTRAST  Result Date: 03/03/2023 CLINICAL DATA:  Abdominal pain EXAM: CT ABDOMEN AND PELVIS WITHOUT CONTRAST TECHNIQUE: Multidetector CT imaging of the abdomen and pelvis was performed following the standard protocol without IV contrast. RADIATION DOSE REDUCTION: This exam was performed according to the departmental dose-optimization program which includes automated exposure control, adjustment of the mA and/or kV according to patient size and/or use of iterative reconstruction technique. COMPARISON:  CT abdomen and pelvis 02/02/2023 FINDINGS: Lower chest: No acute abnormality. Hepatobiliary: No focal liver abnormality is seen. Status post cholecystectomy. No biliary dilatation. Pancreas: Unremarkable. No pancreatic ductal dilatation or surrounding inflammatory changes. Spleen: Normal in size without focal abnormality. Adrenals/Urinary Tract: There is a rounded hypodensity in the superior pole the right kidney measuring less than 1 cm, unchanged. There is no hydronephrosis or urinary tract calculus. The adrenal glands and bladder are within normal limits. Stomach/Bowel: Stomach is within normal limits. Gastric band in place. Appendix appears normal. No acute inflammation, pneumatosis or free air. There some subtle questionable wall thickening of the mid sigmoid colon measuring 5.5 cm in length on image 6/63. Vascular/Lymphatic: Aortic atherosclerosis. No enlarged abdominal or pelvic lymph nodes. Reproductive: Uterus and bilateral adnexa are unremarkable. Other: No abdominal wall hernia or abnormality. There is mild body wall edema. No abdominopelvic ascites. Musculoskeletal: Compression deformities of L2, L3 and L4 appear stable. IMPRESSION: 1. Questionable wall thickening of the mid sigmoid colon. Correlate clinically for colitis. Neoplasm can not be excluded. Recommend further evaluation with colonoscopy. 2. Mild body wall edema. 3. Stable hypodense lesion in the right kidney,  indeterminate. This can be further evaluated with ultrasound or MRI. 4. Gastric band in place. No bowel obstruction. 5. Aortic atherosclerosis. Aortic Atherosclerosis (ICD10-I70.0). Electronically Signed   By: Darliss Cheney M.D.   On: 03/03/2023 23:08   VAS [place] LOWER EXTREMITY VENOUS (DVT)  Result Date: 03/03/2023  Lower Venous DVT Study Patient Name:  Cindy Holmes [facility]  Date of Exam:   03/03/2023 Medical Rec #: [id number]        Accession #:    [id number] Date of Birth: 20-Mar-1951       Patient Gender: F Patient Age:   72 years Exam Location:  [facility] Procedure:      VAS [place] LOWER EXTREMITY VENOUS (DVT) Referring Phys: Kathlen Mody --------------------------------------------------------------------------------  Indications: Swelling.  Comparison Study: No previous study. Performing Technologist: McKayla Maag RVT, [place]  Examination Guidelines: A complete evaluation includes B-mode imaging, spectral Doppler, color Doppler, and power Doppler as needed of all accessible portions of each  PERIPHERAL VASCULAR CATHETERIZATION  Result Date: 03/08/2023 See surgical note for result.  ECHOCARDIOGRAM COMPLETE  Result Date: 03/06/2023    ECHOCARDIOGRAM REPORT   Patient Name:   Cindy Holmes [facility] Date of Exam: 03/06/2023 Medical Rec #:  [id number]       Height:       63.0 in Accession #:    [id number]      Weight:       229.9 lb Date of Birth:  1950-08-02      BSA:          2.052 m Patient Age:    71 years        BP:           119/52 mmHg Patient Gender: F               HR:           79 bpm. Exam Location:  Inpatient Procedure: 2D Echo, Color Doppler and Cardiac Doppler Indications:    fluid overload  History:        Patient has prior history of Echocardiogram examinations, most                 recent 10/27/2020. CAD; Risk Factors:Hypertension and                  Dyslipidemia.  Sonographer:    Delcie Roch RDCS Referring Phys: Nilda Calamity AKULA IMPRESSIONS  1. Left ventricular ejection fraction, by estimation, is 65 to 70%. The left ventricle has normal function. The left ventricle has no regional wall motion abnormalities. Left ventricular diastolic parameters are consistent with Grade II diastolic dysfunction (pseudonormalization).  2. Right ventricular systolic function is normal. The right ventricular size is dilated. There is severely elevated pulmonary artery systolic pressure. The estimated right ventricular systolic pressure is 61.5 mmHg.  3. Left atrial size was mildly dilated.  4. The mitral valve is normal in structure. Mild to moderate mitral valve regurgitation.  5. Multiple jets. Tricuspid valve regurgitation is mild to moderate.  6. The aortic valve is tricuspid. Aortic valve regurgitation is not visualized. No aortic stenosis is present.  7. The inferior vena cava is dilated in size with <50% respiratory variability, suggesting right atrial pressure of 15 mmHg. Comparison(s): Prior images reviewed side by side. RV is dilated compared to prior on 4 chamber live views. RVSP has increased. FINDINGS  Left Ventricle: Left ventricular ejection fraction, by estimation, is 65 to 70%. The left ventricle has normal function. The left ventricle has no regional wall motion abnormalities. The left ventricular internal cavity size was normal in size. There is  no left ventricular hypertrophy. Left ventricular diastolic parameters are consistent with Grade II diastolic dysfunction (pseudonormalization). Right Ventricle: The right ventricular size is dilated. No increase in right ventricular wall thickness. Right ventricular systolic function is normal. There is severely elevated pulmonary artery systolic pressure. The tricuspid regurgitant velocity is 3.41 m/s, and with an assumed right atrial pressure of 15 mmHg, the estimated right ventricular systolic  pressure is 61.5 mmHg. Left Atrium: Left atrial size was mildly dilated. Right Atrium: Right atrial size was normal in size. Pericardium: Trivial pericardial effusion is present. The pericardial effusion is circumferential. Presence of epicardial fat layer. Mitral Valve: The mitral valve is normal in structure. Mild to moderate mitral valve regurgitation. Tricuspid Valve: Multiple jets. The tricuspid valve is normal in structure. Tricuspid valve regurgitation is mild to moderate. Aortic Valve: The aortic valve is tricuspid. Aortic valve regurgitation is  0.25 m MV A velocity: 107.00 cm/s  Systemic Diam: 2.00 cm MV E/A ratio:  1.06 Vishnu Priya Mallipeddi Electronically signed by Winfield Rast Mallipeddi Signature Date/Time: 03/30/2023/12:23:49 PM    Final    DG Abd 1 View  Result Date: 03/30/2023 CLINICAL DATA:  Left upper quadrant pain EXAM: ABDOMEN - 1 VIEW COMPARISON:  CT chest from previous day FINDINGS: Gastric lap band apparatus projects in expected location. Cholecystectomy clips. Residual contrast material in the renal collecting systems and urinary bladder. Normal bowel gas pattern. Regional bones unremarkable. IMPRESSION: Nonobstructive bowel gas pattern. Electronically Signed   By: Corlis Leak M.D.   On: 03/30/2023 11:53   CT Angio Chest PE W and/or Wo Contrast  Result Date: 03/30/2023 CLINICAL DATA:  Fluid retention. Trouble breathing. History of CHF. Pitting edema in lower extremities. EXAM: CT ANGIOGRAPHY CHEST WITH CONTRAST TECHNIQUE: Multidetector CT imaging of the chest was performed using the standard protocol during bolus administration of intravenous contrast. Multiplanar CT image reconstructions and MIPs were obtained to evaluate the vascular anatomy. RADIATION DOSE REDUCTION: This exam was performed according to the departmental dose-optimization program which includes automated exposure control, adjustment of the mA and/or kV according to patient size and/or use of iterative reconstruction technique. CONTRAST:  75mL OMNIPAQUE IOHEXOL 350 MG/ML SOLN COMPARISON:  Radiograph 03/29/2023 and report from CT chest 05/14/2018 FINDINGS: Cardiovascular: Negative for acute pulmonary embolism. No pericardial effusion. Coronary artery and aortic atherosclerotic calcification. Cardiomegaly. Mediastinum/Nodes: Trachea is unremarkable. Unremarkable esophagus. No thoracic adenopathy. Lungs/Pleura: Mosaic attenuation of the lungs compatible with air trapping. Bibasilar atelectasis or infiltrates. Mild bronchial wall thickening.  Small left and trace right pleural effusions Upper Abdomen: No acute abnormality.  Gastric band. Musculoskeletal: No acute fracture. 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 Review of the MIP images confirms the above findings. IMPRESSION: 1. Negative for acute pulmonary embolism. 2. Findings compatible with bronchitis/reactive airways. 3. Small left and trace right pleural effusions. Aortic Atherosclerosis (ICD10-I70.0). Electronically Signed   By: Minerva Fester M.D.   On: 03/30/2023 00:10   DG Chest Port 1 View  Result Date: 03/29/2023 CLINICAL DATA:  Short of breath, lower extremity edema EXAM: PORTABLE CHEST 1 VIEW COMPARISON:  03/05/2023 FINDINGS: Single frontal view of the chest demonstrates stable enlargement of the cardiac silhouette. Scattered areas of subsegmental atelectasis or scarring are seen at the lung bases. No airspace disease, effusion, or pneumothorax. No acute bony abnormalities. IMPRESSION: 1. Stable enlarged cardiac silhouette. 2. No acute intrathoracic process. Electronically Signed   By: Sharlet Salina M.D.   On: 03/29/2023 21:53   VAS [place] ABI WITH/WO TBI  Result Date: 03/13/2023  LOWER EXTREMITY DOPPLER STUDY Patient Name:  Cindy Holmes  Date of Exam:   03/13/2023 Medical Rec #: [id number]        Accession #:    [id number] Date of Birth: [birth year]       Patient Gender: F Patient Age:   46 years Exam Location:  [facility] Procedure:      VAS [place] ABI WITH/WO TBI Referring Phys: Dorcas Carrow --------------------------------------------------------------------------------  Indications: Cool left foot with Dopplerable pulses per ordering MD High Risk Factors: Hypertension, hyperlipidemia, coronary artery disease. 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 Paroxysmal atrial fibrillation.  Limitations: Today's exam was limited due to Edema, irregular heart beat, pain  0.25 m MV A velocity: 107.00 cm/s  Systemic Diam: 2.00 cm MV E/A ratio:  1.06 Vishnu Priya Mallipeddi Electronically signed by Winfield Rast Mallipeddi Signature Date/Time: 03/30/2023/12:23:49 PM    Final    DG Abd 1 View  Result Date: 03/30/2023 CLINICAL DATA:  Left upper quadrant pain EXAM: ABDOMEN - 1 VIEW COMPARISON:  CT chest from previous day FINDINGS: Gastric lap band apparatus projects in expected location. Cholecystectomy clips. Residual contrast material in the renal collecting systems and urinary bladder. Normal bowel gas pattern. Regional bones unremarkable. IMPRESSION: Nonobstructive bowel gas pattern. Electronically Signed   By: Corlis Leak M.D.   On: 03/30/2023 11:53   CT Angio Chest PE W and/or Wo Contrast  Result Date: 03/30/2023 CLINICAL DATA:  Fluid retention. Trouble breathing. History of CHF. Pitting edema in lower extremities. EXAM: CT ANGIOGRAPHY CHEST WITH CONTRAST TECHNIQUE: Multidetector CT imaging of the chest was performed using the standard protocol during bolus administration of intravenous contrast. Multiplanar CT image reconstructions and MIPs were obtained to evaluate the vascular anatomy. RADIATION DOSE REDUCTION: This exam was performed according to the departmental dose-optimization program which includes automated exposure control, adjustment of the mA and/or kV according to patient size and/or use of iterative reconstruction technique. CONTRAST:  75mL OMNIPAQUE IOHEXOL 350 MG/ML SOLN COMPARISON:  Radiograph 03/29/2023 and report from CT chest 05/14/2018 FINDINGS: Cardiovascular: Negative for acute pulmonary embolism. No pericardial effusion. Coronary artery and aortic atherosclerotic calcification. Cardiomegaly. Mediastinum/Nodes: Trachea is unremarkable. Unremarkable esophagus. No thoracic adenopathy. Lungs/Pleura: Mosaic attenuation of the lungs compatible with air trapping. Bibasilar atelectasis or infiltrates. Mild bronchial wall thickening.  Small left and trace right pleural effusions Upper Abdomen: No acute abnormality.  Gastric band. Musculoskeletal: No acute fracture. 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 Review of the MIP images confirms the above findings. IMPRESSION: 1. Negative for acute pulmonary embolism. 2. Findings compatible with bronchitis/reactive airways. 3. Small left and trace right pleural effusions. Aortic Atherosclerosis (ICD10-I70.0). Electronically Signed   By: Minerva Fester M.D.   On: 03/30/2023 00:10   DG Chest Port 1 View  Result Date: 03/29/2023 CLINICAL DATA:  Short of breath, lower extremity edema EXAM: PORTABLE CHEST 1 VIEW COMPARISON:  03/05/2023 FINDINGS: Single frontal view of the chest demonstrates stable enlargement of the cardiac silhouette. Scattered areas of subsegmental atelectasis or scarring are seen at the lung bases. No airspace disease, effusion, or pneumothorax. No acute bony abnormalities. IMPRESSION: 1. Stable enlarged cardiac silhouette. 2. No acute intrathoracic process. Electronically Signed   By: Sharlet Salina M.D.   On: 03/29/2023 21:53   VAS [place] ABI WITH/WO TBI  Result Date: 03/13/2023  LOWER EXTREMITY DOPPLER STUDY Patient Name:  Cindy Holmes  Date of Exam:   03/13/2023 Medical Rec #: [id number]        Accession #:    [id number] Date of Birth: [birth year]       Patient Gender: F Patient Age:   46 years Exam Location:  [facility] Procedure:      VAS [place] ABI WITH/WO TBI Referring Phys: Dorcas Carrow --------------------------------------------------------------------------------  Indications: Cool left foot with Dopplerable pulses per ordering MD High Risk Factors: Hypertension, hyperlipidemia, coronary artery disease. 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 Paroxysmal atrial fibrillation.  Limitations: Today's exam was limited due to Edema, irregular heart beat, pain  PERIPHERAL VASCULAR CATHETERIZATION  Result Date: 03/08/2023 See surgical note for result.  ECHOCARDIOGRAM COMPLETE  Result Date: 03/06/2023    ECHOCARDIOGRAM REPORT   Patient Name:   Cindy Holmes [facility] Date of Exam: 03/06/2023 Medical Rec #:  [id number]       Height:       63.0 in Accession #:    [id number]      Weight:       229.9 lb Date of Birth:  1950-08-02      BSA:          2.052 m Patient Age:    71 years        BP:           119/52 mmHg Patient Gender: F               HR:           79 bpm. Exam Location:  Inpatient Procedure: 2D Echo, Color Doppler and Cardiac Doppler Indications:    fluid overload  History:        Patient has prior history of Echocardiogram examinations, most                 recent 10/27/2020. CAD; Risk Factors:Hypertension and                  Dyslipidemia.  Sonographer:    Delcie Roch RDCS Referring Phys: Nilda Calamity AKULA IMPRESSIONS  1. Left ventricular ejection fraction, by estimation, is 65 to 70%. The left ventricle has normal function. The left ventricle has no regional wall motion abnormalities. Left ventricular diastolic parameters are consistent with Grade II diastolic dysfunction (pseudonormalization).  2. Right ventricular systolic function is normal. The right ventricular size is dilated. There is severely elevated pulmonary artery systolic pressure. The estimated right ventricular systolic pressure is 61.5 mmHg.  3. Left atrial size was mildly dilated.  4. The mitral valve is normal in structure. Mild to moderate mitral valve regurgitation.  5. Multiple jets. Tricuspid valve regurgitation is mild to moderate.  6. The aortic valve is tricuspid. Aortic valve regurgitation is not visualized. No aortic stenosis is present.  7. The inferior vena cava is dilated in size with <50% respiratory variability, suggesting right atrial pressure of 15 mmHg. Comparison(s): Prior images reviewed side by side. RV is dilated compared to prior on 4 chamber live views. RVSP has increased. FINDINGS  Left Ventricle: Left ventricular ejection fraction, by estimation, is 65 to 70%. The left ventricle has normal function. The left ventricle has no regional wall motion abnormalities. The left ventricular internal cavity size was normal in size. There is  no left ventricular hypertrophy. Left ventricular diastolic parameters are consistent with Grade II diastolic dysfunction (pseudonormalization). Right Ventricle: The right ventricular size is dilated. No increase in right ventricular wall thickness. Right ventricular systolic function is normal. There is severely elevated pulmonary artery systolic pressure. The tricuspid regurgitant velocity is 3.41 m/s, and with an assumed right atrial pressure of 15 mmHg, the estimated right ventricular systolic  pressure is 61.5 mmHg. Left Atrium: Left atrial size was mildly dilated. Right Atrium: Right atrial size was normal in size. Pericardium: Trivial pericardial effusion is present. The pericardial effusion is circumferential. Presence of epicardial fat layer. Mitral Valve: The mitral valve is normal in structure. Mild to moderate mitral valve regurgitation. Tricuspid Valve: Multiple jets. The tricuspid valve is normal in structure. Tricuspid valve regurgitation is mild to moderate. Aortic Valve: The aortic valve is tricuspid. Aortic valve regurgitation is  0.25 m MV A velocity: 107.00 cm/s  Systemic Diam: 2.00 cm MV E/A ratio:  1.06 Vishnu Priya Mallipeddi Electronically signed by Winfield Rast Mallipeddi Signature Date/Time: 03/30/2023/12:23:49 PM    Final    DG Abd 1 View  Result Date: 03/30/2023 CLINICAL DATA:  Left upper quadrant pain EXAM: ABDOMEN - 1 VIEW COMPARISON:  CT chest from previous day FINDINGS: Gastric lap band apparatus projects in expected location. Cholecystectomy clips. Residual contrast material in the renal collecting systems and urinary bladder. Normal bowel gas pattern. Regional bones unremarkable. IMPRESSION: Nonobstructive bowel gas pattern. Electronically Signed   By: Corlis Leak M.D.   On: 03/30/2023 11:53   CT Angio Chest PE W and/or Wo Contrast  Result Date: 03/30/2023 CLINICAL DATA:  Fluid retention. Trouble breathing. History of CHF. Pitting edema in lower extremities. EXAM: CT ANGIOGRAPHY CHEST WITH CONTRAST TECHNIQUE: Multidetector CT imaging of the chest was performed using the standard protocol during bolus administration of intravenous contrast. Multiplanar CT image reconstructions and MIPs were obtained to evaluate the vascular anatomy. RADIATION DOSE REDUCTION: This exam was performed according to the departmental dose-optimization program which includes automated exposure control, adjustment of the mA and/or kV according to patient size and/or use of iterative reconstruction technique. CONTRAST:  75mL OMNIPAQUE IOHEXOL 350 MG/ML SOLN COMPARISON:  Radiograph 03/29/2023 and report from CT chest 05/14/2018 FINDINGS: Cardiovascular: Negative for acute pulmonary embolism. No pericardial effusion. Coronary artery and aortic atherosclerotic calcification. Cardiomegaly. Mediastinum/Nodes: Trachea is unremarkable. Unremarkable esophagus. No thoracic adenopathy. Lungs/Pleura: Mosaic attenuation of the lungs compatible with air trapping. Bibasilar atelectasis or infiltrates. Mild bronchial wall thickening.  Small left and trace right pleural effusions Upper Abdomen: No acute abnormality.  Gastric band. Musculoskeletal: No acute fracture. 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 Review of the MIP images confirms the above findings. IMPRESSION: 1. Negative for acute pulmonary embolism. 2. Findings compatible with bronchitis/reactive airways. 3. Small left and trace right pleural effusions. Aortic Atherosclerosis (ICD10-I70.0). Electronically Signed   By: Minerva Fester M.D.   On: 03/30/2023 00:10   DG Chest Port 1 View  Result Date: 03/29/2023 CLINICAL DATA:  Short of breath, lower extremity edema EXAM: PORTABLE CHEST 1 VIEW COMPARISON:  03/05/2023 FINDINGS: Single frontal view of the chest demonstrates stable enlargement of the cardiac silhouette. Scattered areas of subsegmental atelectasis or scarring are seen at the lung bases. No airspace disease, effusion, or pneumothorax. No acute bony abnormalities. IMPRESSION: 1. Stable enlarged cardiac silhouette. 2. No acute intrathoracic process. Electronically Signed   By: Sharlet Salina M.D.   On: 03/29/2023 21:53   VAS [place] ABI WITH/WO TBI  Result Date: 03/13/2023  LOWER EXTREMITY DOPPLER STUDY Patient Name:  Cindy Holmes  Date of Exam:   03/13/2023 Medical Rec #: [id number]        Accession #:    [id number] Date of Birth: [birth year]       Patient Gender: F Patient Age:   46 years Exam Location:  [facility] Procedure:      VAS [place] ABI WITH/WO TBI Referring Phys: Dorcas Carrow --------------------------------------------------------------------------------  Indications: Cool left foot with Dopplerable pulses per ordering MD High Risk Factors: Hypertension, hyperlipidemia, coronary artery disease. 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 Paroxysmal atrial fibrillation.  Limitations: Today's exam was limited due to Edema, irregular heart beat, pain  0.25 m MV A velocity: 107.00 cm/s  Systemic Diam: 2.00 cm MV E/A ratio:  1.06 Vishnu Priya Mallipeddi Electronically signed by Winfield Rast Mallipeddi Signature Date/Time: 03/30/2023/12:23:49 PM    Final    DG Abd 1 View  Result Date: 03/30/2023 CLINICAL DATA:  Left upper quadrant pain EXAM: ABDOMEN - 1 VIEW COMPARISON:  CT chest from previous day FINDINGS: Gastric lap band apparatus projects in expected location. Cholecystectomy clips. Residual contrast material in the renal collecting systems and urinary bladder. Normal bowel gas pattern. Regional bones unremarkable. IMPRESSION: Nonobstructive bowel gas pattern. Electronically Signed   By: Corlis Leak M.D.   On: 03/30/2023 11:53   CT Angio Chest PE W and/or Wo Contrast  Result Date: 03/30/2023 CLINICAL DATA:  Fluid retention. Trouble breathing. History of CHF. Pitting edema in lower extremities. EXAM: CT ANGIOGRAPHY CHEST WITH CONTRAST TECHNIQUE: Multidetector CT imaging of the chest was performed using the standard protocol during bolus administration of intravenous contrast. Multiplanar CT image reconstructions and MIPs were obtained to evaluate the vascular anatomy. RADIATION DOSE REDUCTION: This exam was performed according to the departmental dose-optimization program which includes automated exposure control, adjustment of the mA and/or kV according to patient size and/or use of iterative reconstruction technique. CONTRAST:  75mL OMNIPAQUE IOHEXOL 350 MG/ML SOLN COMPARISON:  Radiograph 03/29/2023 and report from CT chest 05/14/2018 FINDINGS: Cardiovascular: Negative for acute pulmonary embolism. No pericardial effusion. Coronary artery and aortic atherosclerotic calcification. Cardiomegaly. Mediastinum/Nodes: Trachea is unremarkable. Unremarkable esophagus. No thoracic adenopathy. Lungs/Pleura: Mosaic attenuation of the lungs compatible with air trapping. Bibasilar atelectasis or infiltrates. Mild bronchial wall thickening.  Small left and trace right pleural effusions Upper Abdomen: No acute abnormality.  Gastric band. Musculoskeletal: No acute fracture. 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 Review of the MIP images confirms the above findings. IMPRESSION: 1. Negative for acute pulmonary embolism. 2. Findings compatible with bronchitis/reactive airways. 3. Small left and trace right pleural effusions. Aortic Atherosclerosis (ICD10-I70.0). Electronically Signed   By: Minerva Fester M.D.   On: 03/30/2023 00:10   DG Chest Port 1 View  Result Date: 03/29/2023 CLINICAL DATA:  Short of breath, lower extremity edema EXAM: PORTABLE CHEST 1 VIEW COMPARISON:  03/05/2023 FINDINGS: Single frontal view of the chest demonstrates stable enlargement of the cardiac silhouette. Scattered areas of subsegmental atelectasis or scarring are seen at the lung bases. No airspace disease, effusion, or pneumothorax. No acute bony abnormalities. IMPRESSION: 1. Stable enlarged cardiac silhouette. 2. No acute intrathoracic process. Electronically Signed   By: Sharlet Salina M.D.   On: 03/29/2023 21:53   VAS [place] ABI WITH/WO TBI  Result Date: 03/13/2023  LOWER EXTREMITY DOPPLER STUDY Patient Name:  Cindy Holmes  Date of Exam:   03/13/2023 Medical Rec #: [id number]        Accession #:    [id number] Date of Birth: [birth year]       Patient Gender: F Patient Age:   46 years Exam Location:  [facility] Procedure:      VAS [place] ABI WITH/WO TBI Referring Phys: Dorcas Carrow --------------------------------------------------------------------------------  Indications: Cool left foot with Dopplerable pulses per ordering MD High Risk Factors: Hypertension, hyperlipidemia, coronary artery disease. 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 Paroxysmal atrial fibrillation.  Limitations: Today's exam was limited due to Edema, irregular heart beat, pain

## 2023-04-02 DIAGNOSIS — I5033 Acute on chronic diastolic (congestive) heart failure: Secondary | ICD-10-CM | POA: Diagnosis not present

## 2023-04-02 DIAGNOSIS — I110 Hypertensive heart disease with heart failure: Secondary | ICD-10-CM | POA: Diagnosis not present

## 2023-04-02 DIAGNOSIS — Z433 Encounter for attention to colostomy: Secondary | ICD-10-CM | POA: Diagnosis not present

## 2023-04-02 DIAGNOSIS — T8131XD Disruption of external operation (surgical) wound, not elsewhere classified, subsequent encounter: Secondary | ICD-10-CM | POA: Diagnosis not present

## 2023-04-02 DIAGNOSIS — M069 Rheumatoid arthritis, unspecified: Secondary | ICD-10-CM | POA: Diagnosis not present

## 2023-04-02 DIAGNOSIS — I272 Pulmonary hypertension, unspecified: Secondary | ICD-10-CM | POA: Diagnosis not present

## 2023-04-04 ENCOUNTER — Telehealth: Payer: Self-pay | Admitting: *Deleted

## 2023-04-04 NOTE — Progress Notes (Signed)
Care Coordination   Note   04/04/2023 Name: MAJESTIC LOCHMANN MRN: [id number] DOB: 22-Dec-1950  Geoffery Lyons Altergott is a 72 y.o. year old female who sees [name], MD for primary care. I reached out to Willaim Rayas by phone today to offer care coordination services.  Ms. Bastardo was given information about Care Coordination services today including:   The Care Coordination services include support from the care team which includes your Nurse Coordinator, Clinical Social Worker, or Pharmacist.  The Care Coordination team is here to help remove barriers to the health concerns and goals most important to you. Care Coordination services are voluntary, and the patient may decline or stop services at any time by request to their care team member.   Care Coordination Consent Status: Patient agreed to services and verbal consent obtained.   Follow up plan:  Telephone appointment with care coordination team member scheduled for:  04/21/23  Encounter Outcome:  Patient Scheduled  [facility] Coordination Care Guide  Direct Dial: [phone]

## 2023-04-06 DIAGNOSIS — Z433 Encounter for attention to colostomy: Secondary | ICD-10-CM | POA: Diagnosis not present

## 2023-04-06 DIAGNOSIS — I110 Hypertensive heart disease with heart failure: Secondary | ICD-10-CM | POA: Diagnosis not present

## 2023-04-06 DIAGNOSIS — I5033 Acute on chronic diastolic (congestive) heart failure: Secondary | ICD-10-CM | POA: Diagnosis not present

## 2023-04-06 DIAGNOSIS — T8131XD Disruption of external operation (surgical) wound, not elsewhere classified, subsequent encounter: Secondary | ICD-10-CM | POA: Diagnosis not present

## 2023-04-06 DIAGNOSIS — I272 Pulmonary hypertension, unspecified: Secondary | ICD-10-CM | POA: Diagnosis not present

## 2023-04-06 DIAGNOSIS — M069 Rheumatoid arthritis, unspecified: Secondary | ICD-10-CM | POA: Diagnosis not present

## 2023-04-12 ENCOUNTER — Ambulatory Visit: Payer: Medicare Other | Admitting: Cardiology

## 2023-04-13 DIAGNOSIS — M069 Rheumatoid arthritis, unspecified: Secondary | ICD-10-CM | POA: Diagnosis not present

## 2023-04-13 DIAGNOSIS — I272 Pulmonary hypertension, unspecified: Secondary | ICD-10-CM | POA: Diagnosis not present

## 2023-04-13 DIAGNOSIS — I110 Hypertensive heart disease with heart failure: Secondary | ICD-10-CM | POA: Diagnosis not present

## 2023-04-13 DIAGNOSIS — Z433 Encounter for attention to colostomy: Secondary | ICD-10-CM | POA: Diagnosis not present

## 2023-04-13 DIAGNOSIS — T8131XD Disruption of external operation (surgical) wound, not elsewhere classified, subsequent encounter: Secondary | ICD-10-CM | POA: Diagnosis not present

## 2023-04-13 DIAGNOSIS — I5033 Acute on chronic diastolic (congestive) heart failure: Secondary | ICD-10-CM | POA: Diagnosis not present

## 2023-04-18 ENCOUNTER — Ambulatory Visit: Payer: Medicare Other | Admitting: Nurse Practitioner

## 2023-04-21 ENCOUNTER — Encounter: Payer: Self-pay | Admitting: *Deleted

## 2023-04-21 ENCOUNTER — Ambulatory Visit: Payer: Self-pay | Admitting: *Deleted

## 2023-04-21 ENCOUNTER — Other Ambulatory Visit (HOSPITAL_COMMUNITY): Payer: Self-pay

## 2023-04-21 DIAGNOSIS — Z433 Encounter for attention to colostomy: Secondary | ICD-10-CM | POA: Diagnosis not present

## 2023-04-21 DIAGNOSIS — I272 Pulmonary hypertension, unspecified: Secondary | ICD-10-CM | POA: Diagnosis not present

## 2023-04-21 DIAGNOSIS — I5033 Acute on chronic diastolic (congestive) heart failure: Secondary | ICD-10-CM | POA: Diagnosis not present

## 2023-04-21 DIAGNOSIS — M069 Rheumatoid arthritis, unspecified: Secondary | ICD-10-CM | POA: Diagnosis not present

## 2023-04-21 DIAGNOSIS — I110 Hypertensive heart disease with heart failure: Secondary | ICD-10-CM | POA: Diagnosis not present

## 2023-04-21 DIAGNOSIS — T8131XD Disruption of external operation (surgical) wound, not elsewhere classified, subsequent encounter: Secondary | ICD-10-CM | POA: Diagnosis not present

## 2023-04-23 DIAGNOSIS — M069 Rheumatoid arthritis, unspecified: Secondary | ICD-10-CM | POA: Diagnosis not present

## 2023-04-23 DIAGNOSIS — E876 Hypokalemia: Secondary | ICD-10-CM | POA: Diagnosis not present

## 2023-04-23 DIAGNOSIS — Z7982 Long term (current) use of aspirin: Secondary | ICD-10-CM | POA: Diagnosis not present

## 2023-04-23 DIAGNOSIS — I5033 Acute on chronic diastolic (congestive) heart failure: Secondary | ICD-10-CM | POA: Diagnosis not present

## 2023-04-23 DIAGNOSIS — M810 Age-related osteoporosis without current pathological fracture: Secondary | ICD-10-CM | POA: Diagnosis not present

## 2023-04-23 DIAGNOSIS — D509 Iron deficiency anemia, unspecified: Secondary | ICD-10-CM | POA: Diagnosis not present

## 2023-04-23 DIAGNOSIS — Z8781 Personal history of (healed) traumatic fracture: Secondary | ICD-10-CM | POA: Diagnosis not present

## 2023-04-23 DIAGNOSIS — I480 Paroxysmal atrial fibrillation: Secondary | ICD-10-CM | POA: Diagnosis not present

## 2023-04-23 DIAGNOSIS — Z6841 Body Mass Index (BMI) 40.0 and over, adult: Secondary | ICD-10-CM | POA: Diagnosis not present

## 2023-04-23 DIAGNOSIS — I739 Peripheral vascular disease, unspecified: Secondary | ICD-10-CM | POA: Diagnosis not present

## 2023-04-23 DIAGNOSIS — D638 Anemia in other chronic diseases classified elsewhere: Secondary | ICD-10-CM | POA: Diagnosis not present

## 2023-04-23 DIAGNOSIS — I110 Hypertensive heart disease with heart failure: Secondary | ICD-10-CM | POA: Diagnosis not present

## 2023-04-23 DIAGNOSIS — I272 Pulmonary hypertension, unspecified: Secondary | ICD-10-CM | POA: Diagnosis not present

## 2023-04-23 DIAGNOSIS — I2510 Atherosclerotic heart disease of native coronary artery without angina pectoris: Secondary | ICD-10-CM | POA: Diagnosis not present

## 2023-04-23 DIAGNOSIS — E785 Hyperlipidemia, unspecified: Secondary | ICD-10-CM | POA: Diagnosis not present

## 2023-04-23 DIAGNOSIS — D84821 Immunodeficiency due to drugs: Secondary | ICD-10-CM | POA: Diagnosis not present

## 2023-04-23 DIAGNOSIS — Z8673 Personal history of transient ischemic attack (TIA), and cerebral infarction without residual deficits: Secondary | ICD-10-CM | POA: Diagnosis not present

## 2023-04-23 DIAGNOSIS — Z993 Dependence on wheelchair: Secondary | ICD-10-CM | POA: Diagnosis not present

## 2023-04-23 DIAGNOSIS — Z7901 Long term (current) use of anticoagulants: Secondary | ICD-10-CM | POA: Diagnosis not present

## 2023-04-23 DIAGNOSIS — Z433 Encounter for attention to colostomy: Secondary | ICD-10-CM | POA: Diagnosis not present

## 2023-04-25 ENCOUNTER — Ambulatory Visit: Payer: Self-pay | Admitting: *Deleted

## 2023-04-25 ENCOUNTER — Encounter: Payer: Self-pay | Admitting: *Deleted

## 2023-04-27 ENCOUNTER — Telehealth: Payer: Self-pay | Admitting: Cardiology

## 2023-04-27 DIAGNOSIS — I110 Hypertensive heart disease with heart failure: Secondary | ICD-10-CM | POA: Diagnosis not present

## 2023-04-27 DIAGNOSIS — Z79899 Other long term (current) drug therapy: Secondary | ICD-10-CM

## 2023-04-27 DIAGNOSIS — I5033 Acute on chronic diastolic (congestive) heart failure: Secondary | ICD-10-CM | POA: Diagnosis not present

## 2023-04-27 DIAGNOSIS — M069 Rheumatoid arthritis, unspecified: Secondary | ICD-10-CM | POA: Diagnosis not present

## 2023-04-27 DIAGNOSIS — Z433 Encounter for attention to colostomy: Secondary | ICD-10-CM | POA: Diagnosis not present

## 2023-04-27 DIAGNOSIS — I480 Paroxysmal atrial fibrillation: Secondary | ICD-10-CM | POA: Diagnosis not present

## 2023-04-27 DIAGNOSIS — I272 Pulmonary hypertension, unspecified: Secondary | ICD-10-CM | POA: Diagnosis not present

## 2023-04-27 NOTE — Telephone Encounter (Signed)
Pt c/o medication issue:  1. Name of Medication: furosemide (LASIX) 20 MG table  spironolactone (ALDACTONE) 25 MG tablet  torsemide (DEMADEX) 20 MG tablet (Expired)   2. How are you currently taking this medication (dosage and times per day)? As writtten  3. Are you having a reaction (difficulty breathing--STAT)? No  4. What is your medication issue? Wyatt Mage stated the patient was in the hospital recently and would like clarification on which medication the patient should be taking. RN Wyatt Mage stated the patient is currently taking the Spironolactone and the Furosemide. Please advise.

## 2023-04-27 NOTE — Telephone Encounter (Signed)
Spoke with RN Tabitha and she states when patient was in the hospital they discontinued her lasix and spironolactone. They started her on torsemide. She states patient informed her that she is no longer taking torsemide because she feels its not working. She started taking her lasix and spironolactone.   Tried to reach patient. Left voicemail to return call to office

## 2023-04-27 NOTE — Patient Outreach (Signed)
Care Coordination   Initial Visit Note   04/21/2023 Name: JIMAYA ASPLIN MRN: [id number] DOB: 05-May-1951  Geoffery Lyons Hollomon is a 72 y.o. year old female who sees [name], MD for primary care. I spoke with  Willaim Rayas by phone today.  What matters to the patients health and wellness today?  Managing colostomy. Recovering from hospitalization for diverticulitis and then heart failure.    Goals Addressed             This Visit's Progress    Care Coordination Services       Care Coordination Goals: Patient will work with [facility] and HHRN Patient will work with [facility] to obtain colostomy supplies Patient will work with RN Care Coordinator Re: how to obtain colostomy supplies after home health services end  [facility] providing colostomy supplies [phone] Patient will continue to do exercises provided by HHPT Patient will practice fall precautions and will use rolling walker or wheelchair for ambulation Patient will keep legs propped up when seated to help with lower extremity swelling Patient will consider using compression stockings/hose. May need to fitting measurement.  Patient will take medication as prescribed Furosemide 40mg  daily and can take BID for increased edema Patient will keep all medical appointments Patient will reach out to provider with any new or worsening symptoms Patient will reach out to RN Care Management Coordinator with any resource or care coordination needs [phone]        SDOH assessments and interventions completed:  Yes  SDOH Interventions Today    Flowsheet Row Most Recent Value  SDOH Interventions   Food Insecurity Interventions Intervention Not Indicated  Housing Interventions Intervention Not Indicated  Transportation Interventions Patient Resources (Friends/Family)  Utilities Interventions Intervention Not Indicated  Financial Strain Interventions Intervention Not Indicated  Physical Activity Interventions  Intervention Not Indicated  Health Literacy Interventions Intervention Not Indicated        Care Coordination Interventions:  Yes, provided  Interventions Today    Flowsheet Row Most Recent Value  Chronic Disease   Chronic disease during today's visit Congestive Heart Failure (CHF), Other  [diverticulosis, rheumatoid arthritis, osteoarthrits]  General Interventions   General Interventions Discussed/Reviewed General Interventions Discussed, General Interventions Reviewed, Labs, Durable Medical Equipment (DME), Doctor Visits, Vaccines  Labs --  [pathology report from colonoscopy/endoscopy]  Vaccines Flu  [questioned if [facility] can administer flu shot. Will ask at next visit.]  Doctor Visits Discussed/Reviewed Doctor Visits Reviewed, Doctor Visits Discussed, PCP, Specialist  Durable Medical Equipment (DME) Wheelchair, Dan Humphreys, Other  [colostomy, purewick]  Wheelchair Standard  PCP/Specialist Visits Compliance with follow-up visit  [06/01/23 Vascular, 06/06/23 cardiology]  Exercise Interventions   Exercise Discussed/Reviewed Exercise Discussed, Exercise Reviewed, Physical Activity, Assistive device use and maintanence  [Requires assistance with ADLs. Has walker and wheelchair for ambulation.]  Physical Activity Discussed/Reviewed Physical Activity Discussed, Physical Activity Reviewed  [Work with HHPT for strength and gait training]  Education Interventions   Education Provided Provided Education  Provided Verbal Education On Nutrition, Mental Health/Coping with Illness, When to see the doctor, Exercise, Medication, Other, [employer] Reviewed Kidney Function  [pathology report from endoscopy/colonoscopy, inpatient labs 03/31/23 CBC-Hgb 7.5. Needs to repeat CBC to check Hgb trend.]  Mental Health Interventions   Mental Health Discussed/Reviewed Mental Health Discussed, Mental Health Reviewed, Other  [Discussed frustration with hospitalization and how she is handling current  medical conditions. Has good family support. Discussed filing a grievance Re: Hospital stay]  Nutrition Interventions   Nutrition Discussed/Reviewed Nutrition  Discussed, Nutrition Reviewed, Portion sizes, Fluid intake, Decreasing salt  [low sodium/heart healthy diet]  Pharmacy Interventions   Pharmacy Dicussed/Reviewed Pharmacy Topics Discussed, Pharmacy Topics Reviewed, Medications and their functions  [furosemide 40mg  daily. Can take BID for edema. Encouraged to do so. Has restarted daily prednisone. Was not taking while hospitalized.]  Safety Interventions   Safety Discussed/Reviewed Safety Discussed, Safety Reviewed, Fall Risk, Home Safety  Home Safety Assistive Devices  Advanced Directive Interventions   Advanced Directives Discussed/Reviewed Advanced Directives Discussed, Advanced Directives Reviewed       Follow up plan: Follow up call scheduled for 04/25/23    Encounter Outcome:  Patient Visit Completed   Demetrios Loll, RN, BSN Care Management Coordinator [facility]  [facility] Direct Dial: [phone] Main #: [phone]

## 2023-04-28 ENCOUNTER — Ambulatory Visit: Payer: Medicare Other | Admitting: Internal Medicine

## 2023-04-28 NOTE — Telephone Encounter (Signed)
Reviewed hospital notes.  Lasix and Spironolactone were stopped during admit in Aug b/c she had worsening renal function in the setting of diarrhea, vomiting. She had colon surgery during that admission and was eventually started on Torsemide, K+ due to volume overload. These meds were continued at DC from [facility] in Sept (admitted for volume overload).  She canceled her follow up appts in [place] this month and has another one scheduled in late Nov. Please call patient to get update on what is going on with her symptoms. She will need earlier follow up and labs. It looks like she is to follow up in the [place] office. Would see if there are any openings there to get her in sooner. Tereso Newcomer, PA-C    04/28/2023 9:11 AM

## 2023-04-29 DIAGNOSIS — I110 Hypertensive heart disease with heart failure: Secondary | ICD-10-CM | POA: Diagnosis not present

## 2023-04-29 DIAGNOSIS — Z433 Encounter for attention to colostomy: Secondary | ICD-10-CM | POA: Diagnosis not present

## 2023-04-29 DIAGNOSIS — I480 Paroxysmal atrial fibrillation: Secondary | ICD-10-CM | POA: Diagnosis not present

## 2023-04-29 DIAGNOSIS — M069 Rheumatoid arthritis, unspecified: Secondary | ICD-10-CM | POA: Diagnosis not present

## 2023-04-29 DIAGNOSIS — I5033 Acute on chronic diastolic (congestive) heart failure: Secondary | ICD-10-CM | POA: Diagnosis not present

## 2023-04-29 DIAGNOSIS — I272 Pulmonary hypertension, unspecified: Secondary | ICD-10-CM | POA: Diagnosis not present

## 2023-04-29 MED ORDER — FUROSEMIDE 40 MG PO TABS: 40.0000 mg | ORAL_TABLET | Freq: Every day | ORAL | Status: DC

## 2023-04-29 NOTE — Telephone Encounter (Signed)
Spoke to pt who stated that weight/swelling has decreased. Pt stated that she is not sob at this time. Pt stated that she is taking Lasix 40 mg once daily, but will take a extra tablet as needed for swelling. Pt stated that she is taking K+ 20 meq BID. Pt verbalized having lab work completed, but stated that she would not be able to get it done until next week d/t lack of transportation.   Please advise.

## 2023-04-29 NOTE — Telephone Encounter (Signed)
Spoke to patient who stated that she had restarted her prednisone on her own and d/c'd the torsemide as she felt like this medication was not helping her. Pt stated she restarted her lasix. Pt stated that she did not want the offered appointment for 05/05/2023 with Dr. Jenene Slicker in the [place] office. Pt accepted appointment for 05/30/23 with Leda Gauze, PA-C in the [place] office. Pt stated that legs are still swollen and she can not get in and out of a car.

## 2023-04-29 NOTE — Telephone Encounter (Signed)
Is her weight increasing? Is her swelling worse? Is she more short of breath? We need to know the dose of Lasix she is taking. Is she taking K+? It will be hard to manage volume without an in person visit. Arrange BMET. Tereso Newcomer, PA-C    04/29/2023 12:42 PM

## 2023-04-29 NOTE — Patient Outreach (Signed)
Care Coordination   Follow Up Visit Note   04/25/2023 Name: Cindy Holmes MRN: [id number] DOB: Mar 11, 1951  Cindy Holmes is a 72 y.o. year old female who sees [name], MD for primary care. I spoke with  Willaim Rayas by phone today.  What matters to the patients health and wellness today?  Preventing recurrence of diverticulitis , whether she should have reversal surgery, and getting ostomy supplies once home health ends    Goals Addressed             This Visit's Progress    Care Coordination Services   On track    Care Coordination Goals: Patient will work with [facility]PT and HHRN Patient will work with [facility] to obtain colostomy supplies Patient will work with RN Care Coordinator Re: how to obtain colostomy supplies after home health services end  [facility] providing colostomy supplies [phone] Patient will continue to do exercises provided by HHPT Patient will practice fall precautions and will use rolling walker or wheelchair for ambulation Patient will keep legs propped up when seated to help with lower extremity swelling Patient will consider using compression stockings/hose. May need to fitting measurement.  Patient will take medication as prescribed Furosemide 40mg  daily and can take BID for increased edema Patient will keep all medical appointments Patient will reach out to provider with any new or worsening symptoms Patient will reach out to RN Care Management Coordinator with any resource or care coordination needs [phone]        SDOH assessments and interventions completed:  Yes  SDOH Interventions Today    Flowsheet Row Most Recent Value  SDOH Interventions   Transportation Interventions Patient Resources (Friends/Family)  Physical Activity Interventions Other (Comments)  [physically unable to increase activity level at this time]        Care Coordination Interventions:  Yes, provided {THN Tip this will not be part of the  note when signed-REQUIRED REPORT FIELD DO NOT Interventions Today    Flowsheet Row Most Recent Value  Chronic Disease   Chronic disease during today's visit Congestive Heart Failure (CHF)  [diverticulosis (colostomy due to partial sigmoid colon removal due to diverticulitis), rheumatoid arthritis, osteoarthrits]  General Interventions   General Interventions Discussed/Reviewed General Interventions Discussed, Vaccines  Labs --  [pathology report from colonoscopy/endoscopy including the size of sigmoid colon that was removed]  Vaccines Flu  [will get this at [facility] when she goes out for her surgeon follow-up visit. HHRN isn't able to give it at home]  Doctor Visits Discussed/Reviewed Doctor Visits Discussed, Doctor Visits Reviewed, PCP, Specialist  Durable Medical Equipment (DME) Dan Humphreys, Wheelchair  [colostomy, purewick]  Wheelchair Standard  PCP/Specialist Visits Compliance with follow-up visit  [05/10/23 surgeon, 06/01/23 Vascular, 06/06/23 cardiology]  Exercise Interventions   Exercise Discussed/Reviewed Exercise Discussed, Physical Activity, Exercise Reviewed  [Requires assistance with ADLs. Has walker and wheelchair for ambulation. resume working with HHPT. HHRN reached out to HHPT to inquire about restarting services]  Physical Activity Discussed/Reviewed Physical Activity Discussed, Physical Activity Reviewed  [Work with HHPT for strength and gait training]  Education Interventions   Education Provided Provided Education  Provided Verbal Education On Nutrition, Mental Health/Coping with Illness, Exercise, Medication, When to see the doctor  Labs Reviewed --  [pathology report from endoscopy/colonoscopy, inpatient labs 03/31/23 CBC-Hgb 7.5. Needs to repeat CBC to check Hgb trend.]  Mental Health Interventions   Mental Health Discussed/Reviewed Mental Health Discussed, Mental Health Reviewed, Other  [Discussed frustration with hospitalization and how she is  handling current medical  conditions. Has good family support.  Some apprehension about having colostomy reversal. Discussed procedure. Encouarged to talk with surgeon.]  Nutrition Interventions   Nutrition Discussed/Reviewed Nutrition Discussed, Nutrition Reviewed, Portion sizes, Fluid intake, Decreasing salt  [low sodium/heart healthy diet]  Pharmacy Interventions   Pharmacy Dicussed/Reviewed Pharmacy Topics Discussed, Pharmacy Topics Reviewed, Medications and their functions  [furosemide 40mg  daily. Can take BID for edema. Encouraged to do so. Has restarted daily prednisone. Was not taking while hospitalized.]  Safety Interventions   Safety Discussed/Reviewed Safety Discussed, Safety Reviewed, Fall Risk, Home Safety  Home Safety Assistive Devices  Advanced Directive Interventions   Advanced Directives Discussed/Reviewed Advanced Directives Discussed, Advanced Directives Reviewed       Follow up plan: Follow up call scheduled for 05/11/23    Encounter Outcome:  Patient Visit Completed   Demetrios Loll, RN, BSN Care Management Coordinator [facility]  [facility] Direct Dial: [phone] Main #: [phone]

## 2023-04-29 NOTE — Telephone Encounter (Signed)
Medication list updated.

## 2023-05-02 NOTE — Telephone Encounter (Signed)
FYI. She has an appt with you 05/30/23. Tereso Newcomer, PA-C    05/02/2023 4:54 PM

## 2023-05-04 DIAGNOSIS — I480 Paroxysmal atrial fibrillation: Secondary | ICD-10-CM | POA: Diagnosis not present

## 2023-05-04 DIAGNOSIS — M069 Rheumatoid arthritis, unspecified: Secondary | ICD-10-CM | POA: Diagnosis not present

## 2023-05-04 DIAGNOSIS — I5033 Acute on chronic diastolic (congestive) heart failure: Secondary | ICD-10-CM | POA: Diagnosis not present

## 2023-05-04 DIAGNOSIS — Z433 Encounter for attention to colostomy: Secondary | ICD-10-CM | POA: Diagnosis not present

## 2023-05-04 DIAGNOSIS — I272 Pulmonary hypertension, unspecified: Secondary | ICD-10-CM | POA: Diagnosis not present

## 2023-05-04 DIAGNOSIS — I110 Hypertensive heart disease with heart failure: Secondary | ICD-10-CM | POA: Diagnosis not present

## 2023-05-05 ENCOUNTER — Other Ambulatory Visit: Payer: Self-pay

## 2023-05-05 MED ORDER — FUROSEMIDE 40 MG PO TABS: 40.0000 mg | ORAL_TABLET | Freq: Every day | ORAL | 0 refills | Status: DC

## 2023-05-05 NOTE — Plan of Care (Signed)
[facility] Tonsillectomy/Adenoidectomy, Postoperative PEDS care plan entered in error.

## 2023-05-10 DIAGNOSIS — Z23 Encounter for immunization: Secondary | ICD-10-CM | POA: Diagnosis not present

## 2023-05-11 ENCOUNTER — Encounter: Payer: Self-pay | Admitting: *Deleted

## 2023-05-11 ENCOUNTER — Ambulatory Visit: Payer: Self-pay | Admitting: *Deleted

## 2023-05-11 NOTE — Patient Outreach (Signed)
Care Coordination   Follow Up Visit Note   05/11/2023 Name: Cindy Holmes MRN: [id number] DOB: April 24, 1951  Cindy Holmes is a 72 y.o. year old female who sees [name], MD for primary care. I spoke with  Willaim Holmes by phone today.  What matters to the patients health and wellness today? Connecting with an ostomy care center that can provide ostomy supplies without having to order through a DME company for a copay.    Goals Addressed             This Visit's Progress    Care Coordination Services   On track    Care Coordination Goals: Patient will work with [facility] or PCP office to obtain ostomy supplies  [place] ([phone]) was providing ostomy supplies through [place] home [facility], but home health services have ended.  Surgeon's office has left a message for [facility] for [facility] regarding supplies, but has not heard back yet If she can establish with the ostomy clinic, she was told that they can order her supplies and she won't have to pay the $40 copay that she would have to pay to [facility] if ordered through then by surgeon or PCP Patient will practice fall precautions and will use rolling walker or wheelchair for ambulation Patient will keep legs propped up when seated to help with lower extremity swelling Patient will consider using compression stockings/hose. May need to fitting measurement.  Patient will take medication as prescribed Furosemide 40mg  daily and can take BID for increased edema Patient will keep all medical appointments Patient will reach out to provider with any new or worsening symptoms Patient will reach out to RN Care Management Coordinator with any resource or care coordination needs [phone]        SDOH assessments and interventions completed:  Yes  SDOH Interventions Today    Flowsheet Row Most Recent Value  SDOH Interventions   Transportation Interventions Patient Resources  (Friends/Family)  Financial Strain Interventions Intervention Not Indicated        Care Coordination Interventions:  Yes, provided  Interventions Today    Flowsheet Row Most Recent Value  Chronic Disease   Chronic disease during today's visit Congestive Heart Failure (CHF), Other  [diverticulosis (colostomy due to partial sigmoid colon removal due to diverticulitis), rheumatoid arthritis, osteoarthrits]  General Interventions   General Interventions Discussed/Reviewed General Interventions Discussed, General Interventions Reviewed, Communication with, Doctor Visits, Durable Medical Equipment (DME)  Doctor Visits Discussed/Reviewed Doctor Visits Discussed, Doctor Visits Reviewed, Specialist, PCP  [Discussed recent visit with surgeon. Home Health services have ended. Patient will need ostomy supplies.]  Durable Medical Equipment (DME) Wheelchair  [colostomy, purewick]  Wheelchair Standard  PCP/Specialist Visits Compliance with follow-up visit  [keep follow-up with surgeon, follow-up with PCP as recommended.]  Communication with RN, Social Work  [employer] 1 & 2 teams regarding [facility] for Ostomy Care to see if they know how to get in contact with that department or if there are any other programs that manage stomas and ostomy supplies.]  Education Interventions   Education Provided Provided Education  Provided Verbal Education On When to see the doctor, Other  [patient is attempting to get ostomy supplies through an ostomy care center. If she has to purchase them directly from [employer] or another supply company, she has to pay a $40 copay.]       Follow up plan: Follow up call scheduled for 05/16/23.  Advised patient that [facility]  has opted to not participate with [facility] Care Management and that going forward they will be providing care management services. She requested that I assist with finding a way for her to get her ostomy supplies before I release her.     Encounter Outcome:  Patient Visit Completed   Demetrios Loll, RN, BSN Care Management Coordinator [facility]  [facility] Direct Dial: [phone] Main #: [phone]

## 2023-05-16 ENCOUNTER — Encounter: Payer: Self-pay | Admitting: *Deleted

## 2023-05-16 ENCOUNTER — Ambulatory Visit: Payer: Self-pay | Admitting: *Deleted

## 2023-05-16 NOTE — Patient Outreach (Signed)
  Care Coordination   Follow Up Visit Note   05/16/2023 Name: Cindy Holmes MRN: [id number] DOB: Jan 24, 1951  Cindy Holmes is a 72 y.o. year old female who sees [name], MD for primary care. I spoke with  Willaim Rayas by phone today.  What matters to the patients health and wellness today?  Getting colostomy supplies through [facility]    Goals Addressed             This Visit's Progress    Care Coordination Services   On track    Care Coordination Goals: Patient will work with [facility] or PCP office to obtain ostomy supplies  [place] ([phone]) was providing ostomy supplies through [facility], but home health services have ended.  Surgeon's office has referred patient to [facility] for [facility] regarding supplies, but has not heard back yet If she can establish with the ostomy clinic, she was told that they can order her supplies and she won't have to pay the $40 copay that she would have to pay to [facility] if ordered through then by surgeon or PCP Patient will practice fall precautions and will use rolling walker or wheelchair for ambulation Patient will keep legs propped up when seated to help with lower extremity swelling Patient will consider using compression stockings/hose. May need to fitting measurement.  Patient will take medication as prescribed Furosemide 40mg  daily and can take BID for increased edema Patient will keep all medical appointments Patient will reach out to provider with any new or worsening symptoms Patient will reach out to RN Care Management Coordinator with any resource or care coordination needs [phone]        SDOH assessments and interventions completed:  Yes  SDOH Interventions Today    Flowsheet Row Most Recent Value  SDOH Interventions   Transportation Interventions Patient Resources (Friends/Family)  Financial Strain Interventions Intervention Not Indicated         Care Coordination Interventions:  Yes, provided  Interventions Today    Flowsheet Row Most Recent Value  Chronic Disease   Chronic disease during today's visit Congestive Heart Failure (CHF), Other  [diverticulosis (colostomy due to partial sigmoid colon removal due to diverticulitis), rheumatoid arthritis, osteoarthrits]  General Interventions   General Interventions Discussed/Reviewed General Interventions Discussed, General Interventions Reviewed, Doctor Visits, Durable Medical Equipment (DME)  Doctor Visits Discussed/Reviewed Doctor Visits Discussed, Doctor Visits Reviewed, Specialist  Durable Medical Equipment (DME) Other, Wheelchair  [colostomy]  Wheelchair Standard  PCP/Specialist Visits Compliance with follow-up visit  [Keep follow-up with surgeon in February 2025 and sooner if needed.]  Communication with PCP/Specialists  [Left message for [facility] Re: referral to [facility]. I have been unable to find contact information for the Ostomy Clinic.]  Education Interventions   Education Provided Provided Education  Provided Verbal Education On When to see the doctor  Safety Interventions   Safety Discussed/Reviewed Safety Discussed, Safety Reviewed, Fall Risk, Home Safety  Home Safety Assistive Devices       Follow up plan:  RN Care Manager will call patient back once [facility] has returned call.     Encounter Outcome:  Patient Visit Completed   Demetrios Loll, RN, BSN Care Management Coordinator [facility]  [facility] Direct Dial: [phone] Main #: [phone]

## 2023-05-16 NOTE — Progress Notes (Signed)
Cardiology Office Note:  .   Date:  05/30/2023  ID:  Willaim Rayas, DOB Jun 17, 1951, MRN [id number] PCP: [name], MD  [facility] Providers Cardiologist:  Marjo Bicker, MD    History of Present Illness: .   Cindy Holmes is a 72 y.o. female with history of CAD, PAF, HTN, HLD, Chronic diastolic CHF, morbid obesity, ? Prior CVA/TIA.  Early 03/2023 patient's ACEI And sprio stopped due to soft BP's, orthostasis and AKI. 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   Patient discharge 03/31/2023 after treatment for fluid overlaod. Echo normal LVEF and improved with IV lasix.Patient declined further electrolyte repletion and PRBC transfusion during the hospitalization. She called in saying she stopped stopped torsemide and restarted lasix 40 mg daily and prednisone.   Patient comes in for f/u with her husband. She says the torsemide didn't work but doing better back on lasix and spironolactone.Asking about ozempic.    ROS:    Studies Reviewed: Marland Kitchen         Prior CV Studies: ECHO COMPLETE WO IMAGING ENHANCING AGENT 03/30/2023  Narrative ECHOCARDIOGRAM REPORT    Patient Name:   MANERVIA MUSCO [facility] Date of Exam: 03/30/2023 Medical Rec #:  [id number]       Height:       63.0 in Accession #:    [id number]      Weight:       242.5 lb Date of Birth:  09-29-1950      BSA:          2.099 m Patient Age:    71 years        BP:           109/56 mmHg Patient Gender: F               HR:           97 bpm. Exam Location:  Inpatient  Procedure: 2D Echo, Cardiac Doppler and Color Doppler  Indications:    CHF I50.9  History:        Patient has prior history of Echocardiogram examinations, most recent 03/06/2023. CHF, CAD, Stroke, Arrythmias:Atrial Fibrillation, Signs/Symptoms:Chest Pain; Risk Factors:Hypertension and Dyslipidemia.  Sonographer:    Lucendia Herrlich Referring Phys: [id number] ASIA B ZIERLE-GHOSH  IMPRESSIONS   1. Left ventricular ejection  fraction, by estimation, is 60 to 65%. The left ventricle has normal function. The left ventricle has no regional wall motion abnormalities. Left ventricular diastolic function could not be evaluated. 2. Right ventricular systolic function is normal. The right ventricular size is normal. There is moderately elevated pulmonary artery systolic pressure. The estimated right ventricular systolic pressure is 56.4 mmHg. 3. The mitral valve is normal in structure. Mild mitral valve regurgitation. No evidence of mitral stenosis. 4. The aortic valve was not well visualized. Aortic valve regurgitation is not visualized. Mild aortic valve stenosis. Aortic valve area, by VTI measures 1.77 cm. Aortic valve mean gradient measures 15.0 mmHg. Aortic valve Vmax measures 2.58 m/s. 5. The inferior vena cava is dilated in size with >50% respiratory variability, suggesting right atrial pressure of 8 mmHg.  Comparison(s): Changes from prior study are noted. Mild AS is new.  FINDINGS Left Ventricle: Left ventricular ejection fraction, by estimation, is 60 to 65%. The left ventricle has normal function. The left ventricle has no regional wall motion abnormalities. The left ventricular internal cavity size was normal in size. There is no left ventricular hypertrophy. Left ventricular diastolic function could not be evaluated due  to atrial fibrillation. Left ventricular diastolic function could not be evaluated.  Right Ventricle: The right ventricular size is normal. No increase in right ventricular wall thickness. Right ventricular systolic function is normal. There is moderately elevated pulmonary artery systolic pressure. The tricuspid regurgitant velocity is 3.48 m/s, and with an assumed right atrial pressure of 8 mmHg, the estimated right ventricular systolic pressure is 56.4 mmHg.  Left Atrium: Left atrial size was normal in size.  Right Atrium: Right atrial size was normal in size.  Pericardium: There is no  evidence of pericardial effusion.  Mitral Valve: The mitral valve is normal in structure. Mild mitral valve regurgitation. No evidence of mitral valve stenosis.  Tricuspid Valve: The tricuspid valve is grossly normal. Tricuspid valve regurgitation is mild . No evidence of tricuspid stenosis.  Aortic Valve: The aortic valve was not well visualized. Aortic valve regurgitation is not visualized. Mild aortic stenosis is present. Aortic valve mean gradient measures 15.0 mmHg. Aortic valve peak gradient measures 26.7 mmHg. Aortic valve area, by VTI measures 1.77 cm.  Pulmonic Valve: The pulmonic valve was not well visualized. Pulmonic valve regurgitation is not visualized. No evidence of pulmonic stenosis.  Aorta: The aortic root and ascending aorta are structurally normal, with no evidence of dilitation.  Venous: The inferior vena cava is dilated in size with greater than 50% respiratory variability, suggesting right atrial pressure of 8 mmHg.  IAS/Shunts: No atrial level shunt detected by color flow Doppler.   LEFT VENTRICLE PLAX 2D LVIDd:         4.60 cm   Diastology LVIDs:         2.90 cm   LV e' medial:    8.81 cm/s LV PW:         1.10 cm   LV E/e' medial:  12.8 LV IVS:        0.80 cm   LV e' lateral:   10.10 cm/s LVOT diam:     2.00 cm   LV E/e' lateral: 11.2 LV SV:         78 LV SV Index:   37 LVOT Area:     3.14 cm   RIGHT VENTRICLE             IVC RV S prime:     16.40 cm/s  IVC diam: 2.40 cm TAPSE (M-mode): 1.7 cm  LEFT ATRIUM             Index        RIGHT ATRIUM           Index LA diam:        4.60 cm 2.19 cm/m   RA Area:     16.90 cm LA Vol (A2C):   63.2 ml 30.11 ml/m  RA Volume:   44.40 ml  21.16 ml/m LA Vol (A4C):   47.9 ml 22.82 ml/m LA Biplane Vol: 52.5 ml 25.02 ml/m AORTIC VALVE AV Area (Vmax):    1.69 cm AV Area (Vmean):   1.77 cm AV Area (VTI):     1.77 cm AV Vmax:           258.50 cm/s AV Vmean:          167.000 cm/s AV VTI:            0.439  m AV Peak Grad:      26.7 mmHg AV Mean Grad:      15.0 mmHg LVOT Vmax:         139.00 cm/s  LVOT Vmean:        94.033 cm/s LVOT VTI:          0.248 m LVOT/AV VTI ratio: 0.56  AORTA Ao Root diam: 3.20 cm Ao Asc diam:  3.20 cm  MITRAL VALVE                TRICUSPID VALVE MV Area (PHT): 3.42 cm     TR Peak grad:   48.4 mmHg MV Decel Time: 222 msec     TR Vmax:        348.00 cm/s MR Peak grad: 59.0 mmHg MR Vmax:      384.00 cm/s   SHUNTS MV E velocity: 113.00 cm/s  Systemic VTI:  0.25 m MV A velocity: 107.00 cm/s  Systemic Diam: 2.00 cm MV E/A ratio:  1.06  Vishnu Priya Mallipeddi Electronically signed by Winfield Rast Mallipeddi Signature Date/Time: 03/30/2023/12:23:49 PM    Final       Risk Assessment/Calculations:             Physical Exam:   VS:  BP 108/64   Pulse 68   Ht 5\' 3"  (1.6 m)   Wt 208 lb (94.3 kg)   SpO2 96%   BMI 36.85 kg/m    Wt Readings from Last 3 Encounters:  05/30/23 208 lb (94.3 kg)  03/31/23 241 lb 10 oz (109.6 kg)  03/21/23 255 lb 15.3 oz (116.1 kg)    GEN: Well nourished, well developed in no acute distress NECK: No JVD; No carotid bruits CARDIAC:  RRR, no murmurs, rubs, gallops RESPIRATORY:  Clear to auscultation without rales, wheezing or rhonchi  ABDOMEN: Soft, non-tender, non-distended EXTREMITIES:  trace edema; No deformity   ASSESSMENT AND PLAN: .    CAD status post multiple PCI's in the past, last cath in 2017 with moderate in-stent restenosis of the proximal LAD not hemodynamically significant, managed medically.  Patient has chronic dyspnea on exertion and has nuclear stress test 2022 normal. No chest pain. If bleeding risk was felt to be low, plan was to consider continuing her ASA 81 mg given her history of POBA and BMS.  Chronic diastolic CHF last echo 02/2023 normal LVEF with grade 2 DD. Now doing better after she put herself back on lasix/spironolactone. Will check labs today  PAF on BB and eliquis. Last Hbg 7.5 and declined  transfusion.  Essential hypertension-running on low side.  Hyperlipidemia LDL 55 in 10/2021 on Zetia and Lipitor  Obesity-asking for ozempic to aid in weight loss. I asked her to discuss with GI, surgeons and PCP given recent GI surgery.  Bilateral carotid artery stenosis 12/2022-minimal wall plaque        Dispo: F/u in 3-4 months.  Signed, Jacolyn Reedy, PA-C

## 2023-05-24 ENCOUNTER — Other Ambulatory Visit (HOSPITAL_COMMUNITY): Payer: Self-pay | Admitting: Nurse Practitioner

## 2023-05-24 DIAGNOSIS — K9400 Colostomy complication, unspecified: Secondary | ICD-10-CM

## 2023-05-26 ENCOUNTER — Ambulatory Visit: Payer: Self-pay | Admitting: *Deleted

## 2023-05-26 ENCOUNTER — Encounter: Payer: Self-pay | Admitting: *Deleted

## 2023-05-26 NOTE — Patient Outreach (Signed)
Care Coordination   Follow Up Visit Note   05/26/2023 Name: Cindy Holmes MRN: [id number] DOB: 01/19/1951  Cindy Holmes is a 72 y.o. year old female who sees [name], MD for primary care. I spoke with  Willaim Rayas by phone today.  What matters to the patients health and wellness today?  Ongoing management of colostomy, obtaining a new wheelchair, and talking with someone about Medicare coverage of GLP1 for weight loss.    Goals Addressed             This Visit's Progress    COMPLETED: Care Coordination Services       Care Coordination Goals: Patient will continue to work with [facility] to have ostomy supply orders sent to [facility] as needed.  1st order has been sent in and she was given samples from [facility] Patient will practice fall precautions and will use rolling walker or wheelchair for ambulation Patient will follow-up with PCP regarding order for wheelchair and have a face-to-face visit if needed. Virtual may be an option.  Patient will reach out to provider with any new or worsening symptoms Patient will talk with Maxwell Caul, PharmD with [facility] Re: Medicare Coverage of GLP1 mediations for weight loss in patients with cardiac conditions  Patient does not have any acute or urgent needs related to this goal and will follow-up with PCP regarding management.         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 Patient was provided ostomy supplies by [facility] Ostomy clinic and an order was sent by surgeon to [facility] for mailorder and billing through [facility].]  Wheelchair Standard  [wheelchair is broken. Patient talked with PCP office & they sent an order for a new chair. May need a face-to-face visit before insurance will cover. Advised that a virual video visit may be acceptable. She is awaiting a call back from DME company.]  PCP/Specialist Visits Compliance with follow-up visit  [follow-up with PCP as directed. Reach out to PCP office with any care management needs.]  Communication with PCP/Specialists, Pharmacists  [Secure email to Maxwell Caul, PharmD with [facility]. Can heF/U with Pt. Re: Medicare coverage of GLP1 MED for weight loss since she has cardiac conditions. Received VM from Mike Gip with [facility] Re: patient's disinterest in estabishing care.]  Exercise Interventions   Exercise Discussed/Reviewed Physical Activity  Physical Activity Discussed/Reviewed Physical Activity Discussed, Physical Activity Reviewed  [limited by arthritis. Uses wheelchair for ambulation. Wheelchair is broken. Working with PCP to obtain a new one.]  Education Interventions   Provided Verbal Education On When to see the doctor, Mental Health/Coping with Illness, Medication, Other  [coverage of colostomy supplies through Medicare Part B]  Mental Health Interventions  Mental Health Discussed/Reviewed Mental Health Discussed, Mental Health Reviewed  Pharmacy Interventions   Pharmacy Dicussed/Reviewed  Pharmacy Topics Discussed, Pharmacy Topics Reviewed, Medications and their functions  [Patient read that Medicare is now covering GLP1 MED for patient's with heart conditions. Will talk with Maxwell Caul, PharmD at [facility] to see if this applies to her.]  Safety Interventions   Safety Discussed/Reviewed Safety Discussed, Safety Reviewed, Fall Risk, Home Safety  Home Safety Assistive Devices       Follow up plan: No further intervention required. Patient's Primary Care office is not partnering with the [facility] for care management and will be providing Care Management Services themselves. This final Care Management note will be securely faxed to the PCP office for handoff. Patient has been encouraged to reach out to their PCP office with any resource or care management needs.   Encounter Outcome:  Patient Visit Completed   Demetrios Loll, RN, BSN Care Management Coordinator [facility]  [facility] Direct Dial: [phone] Main #: [phone]

## 2023-05-30 ENCOUNTER — Encounter: Payer: Self-pay | Admitting: Physician Assistant

## 2023-05-30 ENCOUNTER — Ambulatory Visit (INDEPENDENT_AMBULATORY_CARE_PROVIDER_SITE_OTHER): Payer: Medicare Other | Admitting: Physician Assistant

## 2023-05-30 ENCOUNTER — Other Ambulatory Visit (HOSPITAL_COMMUNITY)
Admission: RE | Admit: 2023-05-30 | Discharge: 2023-05-30 | Disposition: A | Discharge status: Home / Self Care | Payer: Medicare Other | Source: Ambulatory Visit | Attending: Physician Assistant | Admitting: Physician Assistant

## 2023-05-30 VITALS — BP 108/64 | HR 68 | Ht 63.0 in | Wt 208.0 lb

## 2023-05-30 DIAGNOSIS — Z79899 Other long term (current) drug therapy: Secondary | ICD-10-CM

## 2023-05-30 DIAGNOSIS — Z6836 Body mass index (BMI) 36.0-36.9, adult: Secondary | ICD-10-CM | POA: Insufficient documentation

## 2023-05-30 DIAGNOSIS — I5032 Chronic diastolic (congestive) heart failure: Secondary | ICD-10-CM | POA: Insufficient documentation

## 2023-05-30 DIAGNOSIS — E66812 Obesity, class 2: Secondary | ICD-10-CM

## 2023-05-30 DIAGNOSIS — I10 Essential (primary) hypertension: Secondary | ICD-10-CM | POA: Insufficient documentation

## 2023-05-30 DIAGNOSIS — E782 Mixed hyperlipidemia: Secondary | ICD-10-CM

## 2023-05-30 DIAGNOSIS — I2510 Atherosclerotic heart disease of native coronary artery without angina pectoris: Secondary | ICD-10-CM | POA: Insufficient documentation

## 2023-05-30 DIAGNOSIS — I480 Paroxysmal atrial fibrillation: Secondary | ICD-10-CM | POA: Insufficient documentation

## 2023-05-30 LAB — BASIC METABOLIC PANEL WITH GFR
Anion gap: 12 (ref 5–15)
BUN: 20 mg/dL (ref 8–23)
CO2: 22 mmol/L (ref 22–32)
Calcium: 9.2 mg/dL (ref 8.9–10.3)
Chloride: 99 mmol/L (ref 98–111)
Creatinine, Ser: 1.13 mg/dL — ABNORMAL HIGH (ref 0.44–1.00)
GFR, Estimated: 52 mL/min — ABNORMAL LOW
Glucose, Bld: 125 mg/dL — ABNORMAL HIGH (ref 70–99)
Potassium: 4.4 mmol/L (ref 3.5–5.1)
Sodium: 133 mmol/L — ABNORMAL LOW (ref 135–145)

## 2023-05-30 LAB — CBC
HCT: 34.2 % — ABNORMAL LOW (ref 36.0–46.0)
Hemoglobin: 10.7 g/dL — ABNORMAL LOW (ref 12.0–15.0)
MCH: 27.6 pg (ref 26.0–34.0)
MCHC: 31.3 g/dL (ref 30.0–36.0)
MCV: 88.4 fL (ref 80.0–100.0)
Platelets: 344 10*3/uL (ref 150–400)
RBC: 3.87 MIL/uL (ref 3.87–5.11)
RDW: 15.1 % (ref 11.5–15.5)
WBC: 10 10*3/uL (ref 4.0–10.5)
nRBC: 0 % (ref 0.0–0.2)

## 2023-05-30 NOTE — Patient Instructions (Addendum)
Medication Instructions:  Your physician recommends that you continue on your current medications as directed. Please refer to the Current Medication list given to you today.  Labwork: BMET & CBC today at [facility].   Testing/Procedures: none  Follow-Up: Your physician recommends that you schedule a follow-up appointment in: 3-4 months  Any Other Special Instructions Will Be Listed Below (If Applicable).  If you need a refill on your cardiac medications before your next appointment, please call your pharmacy.

## 2023-05-30 NOTE — Progress Notes (Unsigned)
VASC[facility] VEIN SPECIALISTS OF [place]  ASSESSMENT / PLAN: Cindy Holmes is a 72 y.o. female with atherosclerosis of native arteries of bilateral lower extremities causing no symptoms.  Recommend:  Abstinence from all tobacco products. Blood glucose control with goal A1c < 7%. Blood pressure control with goal blood pressure < 140/90 mmHg. Lipid reduction therapy with goal LDL-C <100 mg/dL  Aspirin 81mg  PO QD.  Atorvastatin 40-80mg  PO QD (or other "high intensity" statin therapy).  Follow-up in 1 year with ABI  CHIEF COMPLAINT: Peripheral arterial disease  HISTORY OF PRESENT ILLNESS: Cindy Holmes is a 72 y.o. female referred to clinic for evaluation of peripheral arterial disease identified on ankle-brachial index during recent hospitalization.  Patient is unsure why an ankle-brachial index was ordered.  The patient does have multiple lower extremity orthopedic issues which are bothersome to her.  She is minimally ambulatory.  She gets around mostly by scooter.  She does not describe rest pain symptoms.  She has no ulcers in her feet.   Past Medical History:  Diagnosis Date   CAD (coronary artery disease)    a. PTCA LAD 2001/cath 2002-prox and mid LAD stents patent, PTCA ostium Dx 1 b. cath 03/2016: patent LAD stent with less than 40% in-stent restenosis, 10-30% stenosis of D1 and distal LAD with continued medical management recommended. 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    Past Surgical History:  Procedure Laterality Date   BIOPSY  03/04/2023   Procedure: BIOPSY;  Surgeon: Jeani Hawking, MD;  Location: [facility] ENDOSCOPY;  Service: Gastroenterology;;   BOWEL RESECTION  03/08/2023   Procedure: SMALL BOWEL RESECTION;  Surgeon: Harriette Bouillon, MD;  [facility] OR;  Service: General;;   CARDIAC CATHETERIZATION     with stent placement in 2001   CARDIAC CATHETERIZATION N/A 03/12/2016   Procedure: Left Heart Cath and Coronary Angiography;  Surgeon: Yvonne Kendall, MD;  Location: [facility] INVASIVE CV LAB;  Service: Cardiovascular;  Laterality: N/A;   CARDIAC CATHETERIZATION N/A 03/12/2016   Procedure: Intravascular Pressure Wire/FFR Study;  Surgeon: Yvonne Kendall, MD;  Location: [facility] INVASIVE CV LAB;  Service: Cardiovascular;  Laterality: N/A;   CHOLECYSTECTOMY     COLECTOMY WITH COLOSTOMY CREATION/HARTMANN PROCEDURE N/A 03/08/2023   Procedure: COLECTOMY WITH COLOSTOMY CREATION/HARTMANN PROCEDURE;  Surgeon: Harriette Bouillon, MD;  [facility] OR;  Service: General;  Laterality: N/A;   COLONOSCOPY WITH PROPOFOL N/A 03/04/2023   Procedure: COLONOSCOPY WITH PROPOFOL;  Surgeon: Jeani Hawking, MD;  Location: [facility] ENDOSCOPY;  Service: Gastroenterology;  Laterality: N/A;   CORONARY ANGIOPLASTY     2002   ESOPHAGOGASTRODUODENOSCOPY N/A 12/03/2016   Procedure: ESOPHAGOGASTRODUODENOSCOPY (EGD);  Surgeon: Jeani Hawking, MD;  Location: [facility] ENDOSCOPY;  Service: Endoscopy;  Laterality: N/A;   LAPAROTOMY N/A 03/08/2023   Procedure: EXPLORATION LAPAROTOMY;  Surgeon: Harriette Bouillon, MD;  [facility] OR;  Service: General;  Laterality: N/A;  laproscopic adjustable gastric  banding     with APS standard system.    OPEN REDUCTION INTERNAL FIXATION (ORIF) DISTAL RADIAL FRACTURE Right 05/15/2018   Procedure: OPEN REDUCTION INTERNAL FIXATION (ORIF) DISTAL RADIAL FRACTURE;  Surgeon: Roby Lofts, MD;  [facility];  Service: Orthopedics;  Laterality: Right;   ORIF ANKLE FRACTURE  Right 05/15/2018   Procedure: OPEN REDUCTION INTERNAL FIXATION (ORIF) ANKLE FRACTURE;  Surgeon: Roby Lofts, MD;  [facility];  Service: Orthopedics;  Laterality: Right;   ORIF TIBIA PLATEAU Right 05/15/2018   Procedure: OPEN REDUCTION INTERNAL FIXATION (ORIF) TIBIAL PLATEAU;  Surgeon: Roby Lofts, MD;  [facility] OR;  Service: Orthopedics;  Laterality: Right;   Pt has 3 stents     sept 2001   SHOULDER ARTHROSCOPY WITH SUBACROMIAL DECOMPRESSION, ROTATOR CUFF REPAIR AND BICEP TENDON REPAIR Right 11/16/2013   Procedure: RIGHT SHOULDER ARTHROSCOPY, EXTENSIVE DEBRIDEMENT, ROTATOR CUFF REPAIR ;  Surgeon: Eulas Post, MD;  Location:  SURGERY CENTER;  Service: Orthopedics;  Laterality: Right;   TUBAL LIGATION      Family History  Problem Relation Age of Onset   Heart attack Mother    Heart disease Mother    Arthritis/Rheumatoid Father    Cirrhosis Father        hepatic cirrhosis   Breast cancer Sister 32   Diabetes Sister    Heart disease Maternal Aunt    Heart attack Maternal Aunt    Throat cancer Maternal Aunt    Heart attack Maternal Uncle    Colon cancer Maternal Grandmother    Throat cancer Maternal Grandfather    Diabetes Brother    Heart disease Brother    Heart disease Brother    Cancer Other        family history   Heart attack Other        family history    Social History   Socioeconomic History   Marital status: Married    Spouse name: Not on file   Number of children: Not on file   Years of education: Not on file   Highest education level: Not on file  Occupational History   Not on file  Tobacco Use   Smoking status: Never   Smokeless tobacco: Never  Vaping Use   Vaping status: Never Used  Substance and Sexual Activity   Alcohol use: No    Alcohol/week: 0.0 standard drinks of alcohol   Drug use: No   Sexual activity: Never  Other Topics Concern   Not on file  Social History Narrative   Not on file   Social Determinants of Health    Financial Resource Strain: Low Risk  (05/26/2023)   Overall Financial Resource Strain (CARDIA)    Difficulty of Paying Living Expenses: Not very hard  Food Insecurity: No Food Insecurity (04/21/2023)   Hunger Vital Sign    Worried About Running Out of Food in the Last Year: Never true    Ran Out of Food in the Last Year: Never true  Transportation Needs: No Transportation Needs (05/26/2023)   PRAPARE - Administrator, Civil Service (Medical): No    Lack of Transportation (Non-Medical): No  Physical Activity: Inactive (04/25/2023)   Exercise Vital Sign    Days of Exercise per Week: 0 days    Minutes of Exercise per Session: 0 min  Stress: Not on file  Social Connections: Not on file  Intimate Partner Violence: Not At Risk (03/30/2023)   Humiliation, Afraid,  Rape, and Kick questionnaire    Fear of Current or Ex-Partner: No    Emotionally Abused: No    Physically Abused: No    Sexually Abused: No    Allergies  Allergen Reactions   Codeine Phosphate Shortness Of Breath   Morphine And Codeine Nausea And Vomiting   Amoxicillin Nausea And Vomiting   Penicillins Nausea And Vomiting    Has patient had a PCN reaction causing immediate rash, facial/tongue/throat swelling, SOB or lightheadedness with hypotension:YES Has patient had a PCN reaction causing severe rash involving mucus membranes or skin necrosis: NO Has patient had a PCN reaction that required hospitalization NO Has patient had a PCN reaction occurring within the last 10 years: NO If all of the above answers are "NO", then may proceed with Cephalosporin use.   Methotrexate Nausea Only   Lidocaine Rash    Reaction to lidocaine patch    Current Outpatient Medications  Medication Sig Dispense Refill   acetaminophen (TYLENOL) 500 MG tablet Take 1,000 mg by mouth every 6 (six) hours as needed for headache (pain).     allopurinol (ZYLOPRIM) 100 MG tablet Take 1 tablet (100 mg total) by mouth daily. Resume this at  100 mg daily follow with your PCP to titrate this back up as needed.     apixaban (ELIQUIS) 5 MG TABS tablet Take 1 tablet (5 mg total) by mouth 2 (two) times daily. 180 tablet 2   aspirin EC 81 MG tablet Take 81 mg by mouth at bedtime. Swallow whole.     atorvastatin (LIPITOR) 40 MG tablet Take 2 tablets (80 mg total) by mouth daily 180 tablet 3   esomeprazole (NEXIUM) 20 MG capsule Take 20 mg by mouth daily at 12 noon.     ezetimibe (ZETIA) 10 MG tablet Take 1 tablet (10 mg total) by mouth daily. 90 tablet 3   ferrous sulfate 325 (65 FE) MG tablet Take 1 tablet (325 mg total) by mouth every other day. Follow with your PCP to follow your iron deficiency outpatient. 100 tablet 0   folic acid (FOLVITE) 1 MG tablet Take 1 mg by mouth at bedtime.     furosemide (LASIX) 40 MG tablet Take 40 mg by mouth daily as needed.     hydroxychloroquine (PLAQUENIL) 200 MG tablet Take 200 mg by mouth at bedtime.     methotrexate (RHEUMATREX) 2.5 MG tablet Take 10 mg by mouth every Sunday.     metoprolol tartrate (LOPRESSOR) 25 MG tablet Take 25 mg by mouth every morning. & 50 mg in the evening     potassium chloride SA (KLOR-CON M) 20 MEQ tablet Take 20 mEq by mouth 2 (two) times daily.     spironolactone (ALDACTONE) 25 MG tablet Take 25 mg by mouth daily.     zolpidem (AMBIEN) 5 MG tablet Take 5 mg by mouth at bedtime.     No current facility-administered medications for this visit.    PHYSICAL EXAM Vitals:   05/31/23 1110  BP: 114/73  Pulse: 66  SpO2: 97%    Elderly woman in no distress Obese.  In a scooter. Regular rate and rhythm Unlabored breathing No palpable pedal pulses  PERTINENT LABORATORY AND RADIOLOGIC DATA  Most recent CBC    Latest Ref Rng & Units 05/30/2023    1:57 PM 03/31/2023    5:24 AM 03/30/2023    4:34 AM  CBC  WBC 4.0 - 10.5 K/uL 10.0  7.3  6.4   Hemoglobin 12.0 - 15.0  g/dL 56.3  7.5  7.3   Hematocrit 36.0 - 46.0 % 34.2  23.6  23.4   Platelets 150 - 400 K/uL 344  397   394      Most recent CMP    Latest Ref Rng & Units 05/30/2023    1:57 PM 03/31/2023    5:24 AM 03/30/2023   11:45 AM  CMP  Glucose 70 - 99 mg/dL 875  99    BUN 8 - 23 mg/dL 20  7    Creatinine 6.43 - 1.00 mg/dL 3.29  5.18    Sodium 841 - 145 mmol/L 133  127    Potassium 3.5 - 5.1 mmol/L 4.4  3.6  2.8   Chloride 98 - 111 mmol/L 99  93    CO2 22 - 32 mmol/L 22  25    Calcium 8.9 - 10.3 mg/dL 9.2  7.7      Renal function Estimated Creatinine Clearance: 49.9 mL/min (A) (by C-G formula based on SCr of 1.13 mg/dL (H)).  Hgb A1c MFr Bld (%)  Date Value  03/07/2023 5.3    LDL Chol Calc (NIH)  Date Value Ref Range Status  10/19/2021 55 0 - 99 mg/dL Final     +-------+---------------------+-----------+------------+------------+  ABI/TBIToday's ABI          Today's TBIPrevious ABIPrevious TBI  +-------+---------------------+-----------+------------+------------+  Right 2.47 non compressible0.79                                 +-------+---------------------+-----------+------------+------------+  Left  2.47 non compressible0.60                                 +-------+---------------------+-----------+------------+------------+   Rande Brunt. Lenell Antu, MD [facility] Vascular and Vein Specialists of [facility] Phone Number: [phone] 05/31/2023 1:44 PM   Total time spent on preparing this encounter including chart review, data review, collecting history, examining the patient, coordinating care for this new patient, 45 minutes.  Portions of this report may have been transcribed using voice recognition software.  Every effort has been made to ensure accuracy; however, inadvertent computerized transcription errors may still be present.

## 2023-05-31 ENCOUNTER — Encounter: Payer: Self-pay | Admitting: Vascular Surgery

## 2023-05-31 ENCOUNTER — Ambulatory Visit (INDEPENDENT_AMBULATORY_CARE_PROVIDER_SITE_OTHER): Payer: Medicare Other | Admitting: Vascular Surgery

## 2023-05-31 VITALS — BP 114/73 | HR 66

## 2023-05-31 DIAGNOSIS — I739 Peripheral vascular disease, unspecified: Secondary | ICD-10-CM | POA: Diagnosis not present

## 2023-06-01 ENCOUNTER — Encounter: Payer: Medicare Other | Admitting: Vascular Surgery

## 2023-06-03 ENCOUNTER — Other Ambulatory Visit: Payer: Self-pay

## 2023-06-03 DIAGNOSIS — I6523 Occlusion and stenosis of bilateral carotid arteries: Secondary | ICD-10-CM

## 2023-06-03 DIAGNOSIS — I739 Peripheral vascular disease, unspecified: Secondary | ICD-10-CM

## 2023-06-06 ENCOUNTER — Ambulatory Visit: Payer: Medicare Other | Admitting: Nurse Practitioner

## 2023-06-23 DIAGNOSIS — Z6837 Body mass index (BMI) 37.0-37.9, adult: Secondary | ICD-10-CM | POA: Diagnosis not present

## 2023-06-23 DIAGNOSIS — I4891 Unspecified atrial fibrillation: Secondary | ICD-10-CM | POA: Diagnosis not present

## 2023-06-23 DIAGNOSIS — E6609 Other obesity due to excess calories: Secondary | ICD-10-CM | POA: Diagnosis not present

## 2023-06-23 DIAGNOSIS — I110 Hypertensive heart disease with heart failure: Secondary | ICD-10-CM | POA: Diagnosis not present

## 2023-06-23 DIAGNOSIS — Z433 Encounter for attention to colostomy: Secondary | ICD-10-CM | POA: Diagnosis not present

## 2023-06-23 DIAGNOSIS — E272 Addisonian crisis: Secondary | ICD-10-CM | POA: Diagnosis not present

## 2023-06-23 DIAGNOSIS — I10 Essential (primary) hypertension: Secondary | ICD-10-CM | POA: Diagnosis not present

## 2023-06-23 DIAGNOSIS — G894 Chronic pain syndrome: Secondary | ICD-10-CM | POA: Diagnosis not present

## 2023-06-23 DIAGNOSIS — M069 Rheumatoid arthritis, unspecified: Secondary | ICD-10-CM | POA: Diagnosis not present

## 2023-06-23 DIAGNOSIS — M0579 Rheumatoid arthritis with rheumatoid factor of multiple sites without organ or systems involvement: Secondary | ICD-10-CM | POA: Diagnosis not present

## 2023-06-23 DIAGNOSIS — R5381 Other malaise: Secondary | ICD-10-CM | POA: Diagnosis not present

## 2023-06-29 ENCOUNTER — Other Ambulatory Visit: Payer: Self-pay | Admitting: Physician Assistant

## 2023-08-18 ENCOUNTER — Other Ambulatory Visit: Payer: Self-pay

## 2023-08-19 ENCOUNTER — Telehealth: Payer: Self-pay

## 2023-08-19 NOTE — Telephone Encounter (Signed)
 Auth Submission: NO AUTH NEEDED Site of care: Site of care: [facility] Payer: medicare a.b, aarp supp Medication & CPT/J Code(s) submitted: Reclast  (Zolendronic acid) [id number] Route of submission (phone, fax, portal): portal Phone # Fax # Auth type: Buy/Bill HB Units/visits requested: 1 dose, 5mg  Reference number:  Approval from: 08/19/23 to 07/11/24

## 2023-08-25 ENCOUNTER — Ambulatory Visit (HOSPITAL_COMMUNITY): Payer: Medicare Other | Attending: Internal Medicine | Admitting: Occupational Therapy

## 2023-08-25 DIAGNOSIS — R29898 Other symptoms and signs involving the musculoskeletal system: Secondary | ICD-10-CM | POA: Insufficient documentation

## 2023-08-25 DIAGNOSIS — G8929 Other chronic pain: Secondary | ICD-10-CM | POA: Diagnosis not present

## 2023-08-25 DIAGNOSIS — M5441 Lumbago with sciatica, right side: Secondary | ICD-10-CM | POA: Insufficient documentation

## 2023-08-25 DIAGNOSIS — M25511 Pain in right shoulder: Secondary | ICD-10-CM | POA: Insufficient documentation

## 2023-08-25 DIAGNOSIS — M25562 Pain in left knee: Secondary | ICD-10-CM | POA: Insufficient documentation

## 2023-08-25 NOTE — Therapy (Signed)
[facility] Outpatient Rehabilitation at [facility] [address] [place], [place], [address] Phone: [phone]   Fax:  [phone]  Occupational Therapy Wheelchair Evaluation  Patient Details  Name: Cindy Holmes MRN: [id number] Date of Birth: [birth year] No data recorded  Encounter Date: 08/25/2023   OT End of Session - 08/25/23 1506     Visit Number 1    Number of Visits 1    Date for OT Re-Evaluation 08/26/23    Authorization Type Medicare Part A and B, AARP    OT Start Time [phone]    OT Stop Time 1054    OT Time Calculation (min) 78 min    Activity Tolerance Patient tolerated treatment well    Behavior During Therapy [facility] for tasks assessed/performed             Past Medical History:  Diagnosis Date   CAD (coronary artery disease)    a. PTCA LAD 2001/cath 2002-prox and mid LAD stents patent, PTCA ostium Dx 1 b. cath 03/2016: patent LAD stent with less than 40% in-stent restenosis, 10-30% stenosis of D1 and distal LAD with continued medical management recommended. 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    Past Surgical History:  Procedure Laterality Date   BIOPSY  03/04/2023   Procedure: BIOPSY;  Surgeon: Jeani Hawking, MD;  Location: [facility] ENDOSCOPY;  Service: Gastroenterology;;   BOWEL RESECTION  03/08/2023   Procedure: SMALL BOWEL RESECTION;  Surgeon: Harriette Bouillon, MD;  [facility] OR;  Service: General;;   CARDIAC CATHETERIZATION      with stent placement in 2001   CARDIAC CATHETERIZATION N/A 03/12/2016   Procedure: Left Heart Cath and Coronary Angiography;  Surgeon: Yvonne Kendall, MD;  Location: [facility] INVASIVE CV LAB;  Service: Cardiovascular;  Laterality: N/A;   CARDIAC CATHETERIZATION N/A 03/12/2016   Procedure: Intravascular Pressure Wire/FFR Study;  Surgeon: Yvonne Kendall, MD;  Location: [facility] INVASIVE CV LAB;  Service: Cardiovascular;  Laterality: N/A;   CHOLECYSTECTOMY     COLECTOMY WITH COLOSTOMY CREATION/HARTMANN PROCEDURE N/A 03/08/2023   Procedure: COLECTOMY WITH COLOSTOMY CREATION/HARTMANN PROCEDURE;  Surgeon: Harriette Bouillon, MD;  [facility] OR;  Service: General;  Laterality: N/A;   COLONOSCOPY WITH PROPOFOL N/A 03/04/2023   Procedure: COLONOSCOPY WITH PROPOFOL;  Surgeon: Jeani Hawking, MD;  Location: [facility] ENDOSCOPY;  Service: Gastroenterology;  Laterality: N/A;   CORONARY ANGIOPLASTY     2002   ESOPHAGOGASTRODUODENOSCOPY N/A 12/03/2016   Procedure: ESOPHAGOGASTRODUODENOSCOPY (EGD);  Surgeon: Jeani Hawking, MD;  Location: [facility] ENDOSCOPY;  Service: Endoscopy;  Laterality: N/A;   LAPAROTOMY N/A 03/08/2023   Procedure: EXPLORATION LAPAROTOMY;  Surgeon: Harriette Bouillon, MD;  [facility];  Service: General;  Laterality: N/A;   laproscopic adjustable gastric  banding     with APS standard system.    OPEN REDUCTION INTERNAL FIXATION (ORIF) DISTAL RADIAL FRACTURE Right 05/15/2018   Procedure: OPEN REDUCTION INTERNAL FIXATION (  ORIF) DISTAL RADIAL FRACTURE;  Surgeon: Roby Lofts, MD;  [facility];  Service: Orthopedics;  Laterality: Right;   ORIF ANKLE FRACTURE Right 05/15/2018   Procedure: OPEN REDUCTION INTERNAL FIXATION (ORIF) ANKLE FRACTURE;  Surgeon: Roby Lofts, MD;  [facility];  Service: Orthopedics;  Laterality: Right;   ORIF TIBIA PLATEAU Right 05/15/2018   Procedure: OPEN REDUCTION INTERNAL FIXATION (ORIF) TIBIAL PLATEAU;  Surgeon: Roby Lofts, MD;  [facility];  Service: Orthopedics;   Laterality: Right;   Pt has 3 stents     sept 2001   SHOULDER ARTHROSCOPY WITH SUBACROMIAL DECOMPRESSION, ROTATOR CUFF REPAIR AND BICEP TENDON REPAIR Right 11/16/2013   Procedure: RIGHT SHOULDER ARTHROSCOPY, EXTENSIVE DEBRIDEMENT, ROTATOR CUFF REPAIR ;  Surgeon: Eulas Post, MD;  Location: [facility];  Service: Orthopedics;  Laterality: Right;   TUBAL LIGATION      There were no vitals filed for this visit.   Visit Diagnosis: Chronic midline low back pain with right-sided sciatica  Chronic right shoulder pain  Chronic pain of left knee  Other symptoms and signs involving the musculoskeletal system    Problem List Patient Active Problem List   Diagnosis Date Noted   CHF exacerbation (HCC) 03/30/2023   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   Immunosuppression due to drug therapy (HCC) 03/06/2023   Wheelchair dependent 03/06/2023   Morbid obesity with body mass index (BMI) of 40.0 to 49.9 (HCC) 03/02/2023   Diarrhea 02/02/2023   Senile osteoporosis 08/10/2022   Diverticulitis 10/26/2020   PAF (paroxysmal atrial fibrillation) (HCC) 10/26/2020   Acute diverticulitis 10/26/2020   Nausea vomiting and diarrhea    Closed displaced trimalleolar fracture of right ankle 05/28/2018   Fall at home, initial encounter 05/17/2018   Multiple fractures 05/14/2018   Fracture of distal end of right radius 05/14/2018   Tibial plateau fracture, right, closed, initial encounter 05/14/2018   Candida esophagitis (HCC) 12/03/2016   Hypokalemia 12/02/2016   Lactic acidosis 12/02/2016   AKI (acute kidney injury) (HCC) 12/02/2016   Chronic diastolic CHF (congestive heart failure) (HCC) 12/02/2016   Coronary artery disease involving native coronary artery of native heart without angina pectoris 11/13/2015   Chest pain 11/13/2015   Hyperlipemia 11/13/2015   Acute on chronic diastolic CHF (congestive heart failure), NYHA class 3 (HCC) 11/13/2015   Pain in joint, shoulder  region 01/16/2014   Muscle weakness (generalized) 01/16/2014   Decreased range of motion of right shoulder 01/16/2014   Right rotator cuff tear 11/16/2013   Rotator cuff tear 11/16/2013   Rheumatoid arthritis (HCC)    Essential hypertension    Hyperlipidemia    Stroke [facility])    Emotional stress reaction    Leg pain    Carotid artery disease (HCC)    Overweight 11/04/2008   EDEMA 11/04/2008   Pt presented to OT Wheelchair evaluation with husband and Production manager and Mobility specialist Josh Cadle present. Assessment was completed, all questions and concerns answered. Evaluation documentation attached in patients chair and faxed to [facility] and mobility.    Dewayne Jurek Rosemarie Beath, OT 08/25/2023, 3:09 PM  [facility] Outpatient Rehabilitation at [facility] [address] [place], [place], [address] Phone: [phone]   Fax:  [phone]  Name: Cindy Holmes MRN: [id number] Date of Birth: Dec 25, 1950

## 2023-08-31 ENCOUNTER — Ambulatory Visit: Payer: Medicare Other | Admitting: Student

## 2023-08-31 ENCOUNTER — Other Ambulatory Visit: Payer: Self-pay | Admitting: Physician Assistant

## 2023-09-01 ENCOUNTER — Other Ambulatory Visit: Payer: Self-pay | Admitting: Physician Assistant

## 2023-09-01 ENCOUNTER — Encounter (HOSPITAL_COMMUNITY)
Admission: RE | Admit: 2023-09-01 | Discharge: 2023-09-01 | Disposition: A | Discharge status: Home / Self Care | Payer: Medicare Other | Source: Ambulatory Visit | Attending: Sports Medicine | Admitting: Sports Medicine

## 2023-09-01 DIAGNOSIS — M859 Disorder of bone density and structure, unspecified: Secondary | ICD-10-CM | POA: Insufficient documentation

## 2023-09-01 DIAGNOSIS — D84821 Immunodeficiency due to drugs: Secondary | ICD-10-CM | POA: Insufficient documentation

## 2023-09-01 DIAGNOSIS — Z955 Presence of coronary angioplasty implant and graft: Secondary | ICD-10-CM | POA: Insufficient documentation

## 2023-09-01 DIAGNOSIS — M069 Rheumatoid arthritis, unspecified: Secondary | ICD-10-CM | POA: Insufficient documentation

## 2023-09-01 DIAGNOSIS — M329 Systemic lupus erythematosus, unspecified: Secondary | ICD-10-CM | POA: Insufficient documentation

## 2023-09-01 DIAGNOSIS — I5032 Chronic diastolic (congestive) heart failure: Secondary | ICD-10-CM | POA: Insufficient documentation

## 2023-09-01 DIAGNOSIS — I110 Hypertensive heart disease with heart failure: Secondary | ICD-10-CM | POA: Insufficient documentation

## 2023-09-01 DIAGNOSIS — E782 Mixed hyperlipidemia: Secondary | ICD-10-CM | POA: Insufficient documentation

## 2023-09-01 DIAGNOSIS — I480 Paroxysmal atrial fibrillation: Secondary | ICD-10-CM | POA: Insufficient documentation

## 2023-09-01 DIAGNOSIS — Z7901 Long term (current) use of anticoagulants: Secondary | ICD-10-CM | POA: Insufficient documentation

## 2023-09-01 DIAGNOSIS — G8929 Other chronic pain: Secondary | ICD-10-CM | POA: Insufficient documentation

## 2023-09-01 DIAGNOSIS — I6523 Occlusion and stenosis of bilateral carotid arteries: Secondary | ICD-10-CM | POA: Insufficient documentation

## 2023-09-01 DIAGNOSIS — R002 Palpitations: Secondary | ICD-10-CM | POA: Insufficient documentation

## 2023-09-01 DIAGNOSIS — D6869 Other thrombophilia: Secondary | ICD-10-CM | POA: Insufficient documentation

## 2023-09-01 DIAGNOSIS — R0602 Shortness of breath: Secondary | ICD-10-CM | POA: Insufficient documentation

## 2023-09-01 DIAGNOSIS — I2510 Atherosclerotic heart disease of native coronary artery without angina pectoris: Secondary | ICD-10-CM | POA: Insufficient documentation

## 2023-09-01 DIAGNOSIS — Z7960 Long term (current) use of unspecified immunomodulators and immunosuppressants: Secondary | ICD-10-CM | POA: Insufficient documentation

## 2023-09-01 DIAGNOSIS — Z87440 Personal history of urinary (tract) infections: Secondary | ICD-10-CM | POA: Insufficient documentation

## 2023-09-01 DIAGNOSIS — M549 Dorsalgia, unspecified: Secondary | ICD-10-CM | POA: Insufficient documentation

## 2023-09-01 DIAGNOSIS — Z79899 Other long term (current) drug therapy: Secondary | ICD-10-CM | POA: Insufficient documentation

## 2023-09-01 DIAGNOSIS — Z7982 Long term (current) use of aspirin: Secondary | ICD-10-CM | POA: Insufficient documentation

## 2023-09-01 DIAGNOSIS — R062 Wheezing: Secondary | ICD-10-CM | POA: Insufficient documentation

## 2023-09-01 MED ORDER — METOPROLOL TARTRATE 25 MG PO TABS: 25.0000 mg | ORAL_TABLET | Freq: Every morning | ORAL | 2 refills | Status: DC

## 2023-09-01 NOTE — Telephone Encounter (Signed)
 This is a [place] pt.

## 2023-09-05 ENCOUNTER — Other Ambulatory Visit: Payer: Self-pay

## 2023-09-05 MED ORDER — SPIRONOLACTONE 25 MG PO TABS: 25.0000 mg | ORAL_TABLET | Freq: Every day | ORAL | 1 refills | Status: DC

## 2023-09-05 NOTE — Telephone Encounter (Signed)
 This is a [place] pt.

## 2023-09-07 ENCOUNTER — Other Ambulatory Visit: Payer: Self-pay | Admitting: *Deleted

## 2023-09-07 ENCOUNTER — Encounter: Payer: Self-pay | Admitting: Cardiology

## 2023-09-07 ENCOUNTER — Ambulatory Visit: Payer: Medicare Other | Attending: Cardiology | Admitting: Cardiology

## 2023-09-07 VITALS — BP 131/69 | HR 58 | Ht 63.0 in

## 2023-09-07 DIAGNOSIS — I2510 Atherosclerotic heart disease of native coronary artery without angina pectoris: Secondary | ICD-10-CM

## 2023-09-07 DIAGNOSIS — I5032 Chronic diastolic (congestive) heart failure: Secondary | ICD-10-CM | POA: Diagnosis not present

## 2023-09-07 DIAGNOSIS — N179 Acute kidney failure, unspecified: Secondary | ICD-10-CM | POA: Diagnosis not present

## 2023-09-07 DIAGNOSIS — Z79899 Other long term (current) drug therapy: Secondary | ICD-10-CM

## 2023-09-07 DIAGNOSIS — M059 Rheumatoid arthritis with rheumatoid factor, unspecified: Secondary | ICD-10-CM

## 2023-09-07 DIAGNOSIS — M810 Age-related osteoporosis without current pathological fracture: Secondary | ICD-10-CM

## 2023-09-07 DIAGNOSIS — Z6836 Body mass index (BMI) 36.0-36.9, adult: Secondary | ICD-10-CM | POA: Diagnosis not present

## 2023-09-07 DIAGNOSIS — I480 Paroxysmal atrial fibrillation: Secondary | ICD-10-CM

## 2023-09-07 DIAGNOSIS — E669 Obesity, unspecified: Secondary | ICD-10-CM

## 2023-09-07 DIAGNOSIS — D6869 Other thrombophilia: Secondary | ICD-10-CM | POA: Diagnosis not present

## 2023-09-07 DIAGNOSIS — E66812 Obesity, class 2: Secondary | ICD-10-CM | POA: Diagnosis not present

## 2023-09-07 DIAGNOSIS — E782 Mixed hyperlipidemia: Secondary | ICD-10-CM | POA: Diagnosis not present

## 2023-09-07 LAB — CBC WITH DIFFERENTIAL/PLATELET
Basophils Absolute: 0.1 10*3/uL (ref 0.0–0.2)
Basos: 0 %
EOS (ABSOLUTE): 0.1 10*3/uL (ref 0.0–0.4)
Eos: 1 %
Hematocrit: 37.2 % (ref 34.0–46.6)
Hemoglobin: 12.2 g/dL (ref 11.1–15.9)
Immature Grans (Abs): 0.1 10*3/uL (ref 0.0–0.1)
Immature Granulocytes: 1 %
Lymphocytes Absolute: 1.3 10*3/uL (ref 0.7–3.1)
Lymphs: 10 %
MCH: 28.4 pg (ref 26.6–33.0)
MCHC: 32.8 g/dL (ref 31.5–35.7)
MCV: 87 fL (ref 79–97)
Monocytes Absolute: 0.3 10*3/uL (ref 0.1–0.9)
Monocytes: 2 %
Neutrophils Absolute: 10.5 10*3/uL — ABNORMAL HIGH (ref 1.4–7.0)
Neutrophils: 86 %
Platelets: 288 10*3/uL (ref 150–450)
RBC: 4.3 x10E6/uL (ref 3.77–5.28)
RDW: 15.3 % (ref 11.7–15.4)
WBC: 12.3 10*3/uL — ABNORMAL HIGH (ref 3.4–10.8)

## 2023-09-07 MED ORDER — ATORVASTATIN CALCIUM 40 MG PO TABS: ORAL_TABLET | ORAL | 3 refills | Status: DC

## 2023-09-07 MED ORDER — SPIRONOLACTONE 25 MG PO TABS: 25.0000 mg | ORAL_TABLET | Freq: Every day | ORAL | 1 refills | Status: DC

## 2023-09-07 MED ORDER — EZETIMIBE 10 MG PO TABS: 10.0000 mg | ORAL_TABLET | Freq: Every day | ORAL | 3 refills | Status: AC

## 2023-09-07 MED ORDER — FUROSEMIDE 40 MG PO TABS: 40.0000 mg | ORAL_TABLET | Freq: Two times a day (BID) | ORAL | 2 refills | Status: DC

## 2023-09-07 MED ORDER — METOPROLOL TARTRATE 25 MG PO TABS: 25.0000 mg | ORAL_TABLET | Freq: Every morning | ORAL | 2 refills | Status: AC

## 2023-09-07 MED ORDER — POTASSIUM CHLORIDE CRYS ER 20 MEQ PO TBCR: 20.0000 meq | EXTENDED_RELEASE_TABLET | Freq: Two times a day (BID) | ORAL | 7 refills | Status: DC

## 2023-09-07 MED ORDER — APIXABAN 5 MG PO TABS: 5.0000 mg | ORAL_TABLET | Freq: Two times a day (BID) | ORAL | 1 refills | Status: DC

## 2023-09-07 NOTE — Patient Instructions (Signed)
 Medication Instructions:   INCREASE YOUR LASIX TO 40 MG BY MOUTH TWICE DAILY   *If you need a refill on your cardiac medications before your next appointment, please call your pharmacy*   You have been referred to OUR PHARMACIST HERE IN THIS CLINIC FOR CONSIDERATION OF [place]     Lab Work:  TODAY DOWNSTAIRS FIRST FLOOR OF THIS BUILDING AT [facility]--LIPIDS, CMET, CBC W DIFF, PRO-BNP, AND VITAMIN D LEVEL  If you have labs (blood work) drawn today and your tests are completely normal, you will receive your results only by: MyChart Message (if you have MyChart) OR A paper copy in the mail If you have any lab test that is abnormal or we need to change your treatment, we will call you to review the results.    Follow-Up: At [facility], you and your health needs are our priority.  As part of our continuing mission to provide you with exceptional heart care, we have created designated Provider Care Teams.  These Care Teams include your primary Cardiologist (physician) and Advanced Practice Providers (APPs -  Physician Assistants and Nurse Practitioners) who all work together to provide you with the care you need, when you need it.  We recommend signing up for the patient portal called "MyChart".  Sign up information is provided on this After Visit Summary.  MyChart is used to connect with patients for Virtual Visits (Telemedicine).  Patients are able to view lab/test results, encounter notes, upcoming appointments, etc.  Non-urgent messages can be sent to your provider as well.   To learn more about what you can do with MyChart, go to [email or web address].    Your next appointment:   6 month(s)  Provider:   DR. Rosemary Holms    Other Instructions  [facility] Contact for Alternative locations and appointment scheduling   [email or web address]   [email or web address]  [phone]         1st Floor: - Lobby - Registration  - Pharmacy  - Lab - Cafe  2nd  Floor: - PV Lab - Diagnostic Testing (echo, CT, nuclear med)  3rd Floor: - Vacant  4th Floor: - TCTS (cardiothoracic surgery) - AFib Clinic - Structural Heart Clinic - Vascular Surgery  - Vascular Ultrasound  5th Floor: - HeartCare Cardiology (general and EP) - Clinical Pharmacy for coumadin, hypertension, lipid, weight-loss medications, and med management appointments    Valet parking services will be available as well.

## 2023-09-07 NOTE — Progress Notes (Signed)
 Cardiology Office Note:  .   Date:  09/07/2023  ID:  Cindy Holmes, DOB November 09, 1950, MRN [id number] PCP: [name], MD  [facility] Providers Cardiologist:  Truett Mainland, MD PCP: [name], MD  Chief Complaint  Patient presents with   PAF      History of Present Illness: .    Cindy Holmes is a 73 y.o. 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 Shari Prows in our office, today is my first visit with her.  Patient is here today with her husband.  Patient ambulates in wheelchair due to severe arthritis.  She was hospitalized in 03/2023 with acute on chronic HFpEF, improved with IV diuresis.  Patient's breathing is okay, but has swelling from time to time.  She denies any chest pain.  She denies any severe claudication, physical activity severely limited, but also denies any nonhealing wounds on her feet.  She has seen by Dr. Lenell Antu for PAD, was recommended medical management.  Patient has had provide BMS several years ago for CAD, and has been on aspirin 81 mg daily.  She is also had 2 strokes, noted on CT scans.  She has chronic anemia, without any recent drop in hemoglobin.  She denies any blood in colostomy bag.  She is also on Eliquis 5 mg twice daily for her paroxysmal atrial fibrillation.   Vitals:   09/07/23 1003  BP: 131/69  Pulse: (!) 58   Wt 208 lb  ROS:  Review of Systems  Cardiovascular:  Positive for leg swelling. Negative for chest pain, dyspnea on exertion, palpitations and syncope.     Studies Reviewed: Marland Kitchen        EKG 09/07/2023: Sinus rhythm 58 bpm When compared with ECG of 30-Mar-2023 05:10, Premature supraventricular complexes are no longer Present Vent. rate has decreased BY  47 BPM T wave inversion no longer evident in Inferior leads T wave  inversion no longer evident in Lateral leads QT has shortened    Echocardiogram 03/30/2023: 1. Left ventricular ejection fraction, by estimation, is 60 to 65%. The  left ventricle has normal function. The left ventricle has no regional  wall motion abnormalities. Left ventricular diastolic function could not  be evaluated.   2. Right ventricular systolic function is normal. The right ventricular  size is normal. There is moderately elevated pulmonary artery systolic  pressure. The estimated right ventricular systolic pressure is 56.4 mmHg.   3. The mitral valve is normal in structure. Mild mitral valve  regurgitation. No evidence of mitral stenosis.   4. The aortic valve was not well visualized. Aortic valve regurgitation  is not visualized. Mild aortic valve stenosis. Aortic valve area, by VTI  measures 1.77 cm. Aortic valve mean gradient measures 15.0 mmHg. Aortic  valve Vmax measures 2.58 m/s.   5. The inferior vena cava is dilated in size with >50% respiratory  variability, suggesting right atrial pressure of 8 mmHg.   Comparison(s): Changes from prior study are noted. Mild AS is new.   Independently interpreted 05/2023: Hb 10.7 Cr 1.13, eGFR 52, Na 133   04/2022: Chol 157, TG 111, HDL 75, LDL 62 HbA1C 5.3% Hb 10.7 Cr 1.1   Risk Assessment/Calculations:    CHA2DS2-VASc Score = 6  This indicates a 9.7% annual risk of stroke. The patient's score is based upon: CHF History: 1 HTN  History: 1 Diabetes History: 0 Stroke History: 2 Vascular Disease History: 0 Age Score: 1 Gender Score: 1    Physical Exam:   Physical Exam Vitals and nursing note reviewed.  Constitutional:      General: She is not in acute distress. Neck:     Vascular: No JVD.  Cardiovascular:     Rate and Rhythm: Normal rate and regular rhythm.     Heart sounds: Normal heart sounds. No murmur heard. Pulmonary:     Effort: Pulmonary effort is normal.     Breath sounds: Normal breath sounds. No  wheezing or rales.  Musculoskeletal:     Right lower leg: Edema (Trace) present.     Left lower leg: Edema (Trace) present.      VISIT DIAGNOSES:   ICD-10-CM   1. Coronary artery disease involving native coronary artery of native heart without angina pectoris  I25.10 EKG 12-Lead    furosemide (LASIX) 40 MG tablet    atorvastatin (LIPITOR) 40 MG tablet    ezetimibe (ZETIA) 10 MG tablet    metoprolol tartrate (LOPRESSOR) 25 MG tablet    potassium chloride SA (KLOR-CON M) 20 MEQ tablet    spironolactone (ALDACTONE) 25 MG tablet    CBC w/Diff    Comp Met (CMET)    Lipid Profile    Pro b natriuretic peptide (BNP)    Vitamin D, 25-Hydroxy, Total    AMB Referral to [facility] Pharm-D    Vitamin D, 25-Hydroxy, Total    Pro b natriuretic peptide (BNP)    Lipid Profile    Comp Met (CMET)    CBC w/Diff    2. Chronic heart failure with preserved ejection fraction (HCC)  I50.32 furosemide (LASIX) 40 MG tablet    atorvastatin (LIPITOR) 40 MG tablet    ezetimibe (ZETIA) 10 MG tablet    metoprolol tartrate (LOPRESSOR) 25 MG tablet    potassium chloride SA (KLOR-CON M) 20 MEQ tablet    spironolactone (ALDACTONE) 25 MG tablet    CBC w/Diff    Comp Met (CMET)    Lipid Profile    Pro b natriuretic peptide (BNP)    Vitamin D, 25-Hydroxy, Total    Vitamin D, 25-Hydroxy, Total    Pro b natriuretic peptide (BNP)    Lipid Profile    Comp Met (CMET)    CBC w/Diff    3. Obesity without serious comorbidity, unspecified class, unspecified obesity type  E66.9 furosemide (LASIX) 40 MG tablet    atorvastatin (LIPITOR) 40 MG tablet    ezetimibe (ZETIA) 10 MG tablet    metoprolol tartrate (LOPRESSOR) 25 MG tablet    potassium chloride SA (KLOR-CON M) 20 MEQ tablet    spironolactone (ALDACTONE) 25 MG tablet    CBC w/Diff    Comp Met (CMET)    Lipid Profile    Pro b natriuretic peptide (BNP)    Vitamin D, 25-Hydroxy, Total    AMB Referral to [facility] Pharm-D    Vitamin D, 25-Hydroxy, Total     Pro b natriuretic peptide (BNP)    Lipid Profile    Comp Met (CMET)    CBC w/Diff    4. Mixed hyperlipidemia  E78.2 furosemide (LASIX) 40 MG tablet    atorvastatin (LIPITOR) 40 MG tablet    ezetimibe (ZETIA) 10 MG tablet    metoprolol tartrate (LOPRESSOR) 25 MG tablet    potassium chloride SA (KLOR-CON M) 20 MEQ tablet    spironolactone (ALDACTONE) 25 MG tablet    CBC w/Diff  Comp Met (CMET)    Lipid Profile    Pro b natriuretic peptide (BNP)    Vitamin D, 25-Hydroxy, Total    AMB Referral to [facility] Pharm-D    Vitamin D, 25-Hydroxy, Total    Pro b natriuretic peptide (BNP)    Lipid Profile    Comp Met (CMET)    CBC w/Diff    5. Medication management  Z79.899 furosemide (LASIX) 40 MG tablet    atorvastatin (LIPITOR) 40 MG tablet    ezetimibe (ZETIA) 10 MG tablet    metoprolol tartrate (LOPRESSOR) 25 MG tablet    potassium chloride SA (KLOR-CON M) 20 MEQ tablet    spironolactone (ALDACTONE) 25 MG tablet    CBC w/Diff    Comp Met (CMET)    Lipid Profile    Pro b natriuretic peptide (BNP)    Vitamin D, 25-Hydroxy, Total    AMB Referral to [facility] Pharm-D    Vitamin D, 25-Hydroxy, Total    Pro b natriuretic peptide (BNP)    Lipid Profile    Comp Met (CMET)    CBC w/Diff    6. Secondary hypercoagulability disorder (HCC)  D68.69 furosemide (LASIX) 40 MG tablet    atorvastatin (LIPITOR) 40 MG tablet    ezetimibe (ZETIA) 10 MG tablet    metoprolol tartrate (LOPRESSOR) 25 MG tablet    potassium chloride SA (KLOR-CON M) 20 MEQ tablet    spironolactone (ALDACTONE) 25 MG tablet    CBC w/Diff    Comp Met (CMET)    Lipid Profile    Pro b natriuretic peptide (BNP)    Vitamin D, 25-Hydroxy, Total    Vitamin D, 25-Hydroxy, Total    Pro b natriuretic peptide (BNP)    Lipid Profile    Comp Met (CMET)    CBC w/Diff    7. Rheumatoid arthritis with positive rheumatoid factor, involving unspecified site (HCC)  M05.9 furosemide (LASIX) 40 MG tablet    atorvastatin (LIPITOR)  40 MG tablet    ezetimibe (ZETIA) 10 MG tablet    metoprolol tartrate (LOPRESSOR) 25 MG tablet    potassium chloride SA (KLOR-CON M) 20 MEQ tablet    spironolactone (ALDACTONE) 25 MG tablet    CBC w/Diff    Comp Met (CMET)    Lipid Profile    Pro b natriuretic peptide (BNP)    Vitamin D, 25-Hydroxy, Total    Vitamin D, 25-Hydroxy, Total    Pro b natriuretic peptide (BNP)    Lipid Profile    Comp Met (CMET)    CBC w/Diff    8. AKI (acute kidney injury) (HCC)  N17.9 furosemide (LASIX) 40 MG tablet    atorvastatin (LIPITOR) 40 MG tablet    ezetimibe (ZETIA) 10 MG tablet    metoprolol tartrate (LOPRESSOR) 25 MG tablet    potassium chloride SA (KLOR-CON M) 20 MEQ tablet    spironolactone (ALDACTONE) 25 MG tablet    CBC w/Diff    Comp Met (CMET)    Lipid Profile    Pro b natriuretic peptide (BNP)    Vitamin D, 25-Hydroxy, Total    Vitamin D, 25-Hydroxy, Total    Pro b natriuretic peptide (BNP)    Lipid Profile    Comp Met (CMET)    CBC w/Diff    9. Senile osteoporosis  M81.0 furosemide (LASIX) 40 MG tablet    atorvastatin (LIPITOR) 40 MG tablet    ezetimibe (ZETIA) 10 MG tablet    metoprolol tartrate (LOPRESSOR) 25 MG tablet  potassium chloride SA (KLOR-CON M) 20 MEQ tablet    spironolactone (ALDACTONE) 25 MG tablet    CBC w/Diff    Comp Met (CMET)    Lipid Profile    Pro b natriuretic peptide (BNP)    Vitamin D, 25-Hydroxy, Total    Vitamin D, 25-Hydroxy, Total    Pro b natriuretic peptide (BNP)    Lipid Profile    Comp Met (CMET)    CBC w/Diff       ASSESSMENT AND PLAN: .    Cindy Holmes is a 73 y.o. 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 Increase Lasix to 40 mg twice daily. Continue spironolactone 25 mg daily. She prefers taking potassium capsules due to prior  history of hypokalemia. We discussed adding SGLT2i, but patient would like to hold off for now.  CAD: No anginal symptoms at this time. Given her prior BMS and poor bowel, as well as history of stroke, continue aspirin 81 mg daily, as long as there is no acute worsening of her anemia below 9 g/DL. Continue Lipitor 40 mg daily, Zetia 10 mg daily.  Check lipid panel today. Also, check CBC, BMP. At her request, also ordered vitamin D3 there was started by her orthopedic surgeon.  Paroxysmal A-fib: In sinus rhythm today. Continue metoprolol tartrate 25 mg twice daily. Continue Eliquis 5 mg twice daily,  Obesity: Patient would like to consider weight loss medications for obesity. Given her CAD, PAF, I think she will benefit grom GLP-1 agonists.     Meds ordered this encounter  Medications   furosemide (LASIX) 40 MG tablet    Sig: Take 1 tablet (40 mg total) by mouth 2 (two) times daily.    Dispense:  180 tablet    Refill:  2    Dose increase   atorvastatin (LIPITOR) 40 MG tablet    Sig: Take 2 tablets (80 mg total) by mouth daily    Dispense:  180 tablet    Refill:  3    Pt takes 40 mg because she cannot swallow the 80 mg tablets   ezetimibe (ZETIA) 10 MG tablet    Sig: Take 1 tablet (10 mg total) by mouth daily.    Dispense:  90 tablet    Refill:  3    Requesting 1 year supply   metoprolol tartrate (LOPRESSOR) 25 MG tablet    Sig: Take 1 tablet (25 mg total) by mouth every morning. & 50 mg in the evening    Dispense:  270 tablet    Refill:  2   potassium chloride SA (KLOR-CON M) 20 MEQ tablet    Sig: Take 1 tablet (20 mEq total) by mouth 2 (two) times daily.    Dispense:  60 tablet    Refill:  7   spironolactone (ALDACTONE) 25 MG tablet    Sig: Take 1 tablet (25 mg total) by mouth daily.    Dispense:  90 tablet    Refill:  1     I spent 40 minutes in the care of Cindy Holmes today including reviewing labs (CBC, BMP 05/2023), reviewing studies (Echcoardiogram 03/2023),  reviewing records from hospitalization 03/2023, and documenting in the encounter.   F/u in 6 months  Signed, Elder Negus, MD

## 2023-09-07 NOTE — Telephone Encounter (Signed)
 Eliquis 5mg  refill request received. Patient is 73 years old, weight-94.3kg, Crea-1.13 on 05/30/23, Diagnosis-Afib, and last seen by Dr. Rosemary Holms on 09/07/23. Dose is appropriate based on dosing criteria. Will send in refill to requested pharmacy.

## 2023-09-08 ENCOUNTER — Other Ambulatory Visit: Payer: Self-pay | Admitting: Cardiology

## 2023-09-08 ENCOUNTER — Encounter: Payer: Self-pay | Admitting: Cardiology

## 2023-09-08 DIAGNOSIS — Z79899 Other long term (current) drug therapy: Secondary | ICD-10-CM

## 2023-09-08 DIAGNOSIS — M059 Rheumatoid arthritis with rheumatoid factor, unspecified: Secondary | ICD-10-CM

## 2023-09-08 DIAGNOSIS — E782 Mixed hyperlipidemia: Secondary | ICD-10-CM

## 2023-09-08 DIAGNOSIS — D6869 Other thrombophilia: Secondary | ICD-10-CM

## 2023-09-08 DIAGNOSIS — I5032 Chronic diastolic (congestive) heart failure: Secondary | ICD-10-CM

## 2023-09-08 DIAGNOSIS — I480 Paroxysmal atrial fibrillation: Secondary | ICD-10-CM

## 2023-09-08 DIAGNOSIS — E669 Obesity, unspecified: Secondary | ICD-10-CM

## 2023-09-08 DIAGNOSIS — I2510 Atherosclerotic heart disease of native coronary artery without angina pectoris: Secondary | ICD-10-CM

## 2023-09-08 DIAGNOSIS — N179 Acute kidney failure, unspecified: Secondary | ICD-10-CM

## 2023-09-08 DIAGNOSIS — M810 Age-related osteoporosis without current pathological fracture: Secondary | ICD-10-CM

## 2023-09-08 LAB — LIPID PANEL
Chol/HDL Ratio: 1.7 {ratio} (ref 0.0–4.4)
Cholesterol, Total: 167 mg/dL (ref 100–199)
HDL: 98 mg/dL (ref >39)
LDL Chol Calc (NIH): 53 mg/dL (ref 0–99)
Triglycerides: 87 mg/dL (ref 0–149)
VLDL Cholesterol Cal: 16 mg/dL (ref 5–40)

## 2023-09-08 MED ORDER — FUROSEMIDE 40 MG PO TABS: 40.0000 mg | ORAL_TABLET | Freq: Two times a day (BID) | ORAL | 3 refills | Status: DC

## 2023-09-08 MED ORDER — ATORVASTATIN CALCIUM 40 MG PO TABS: ORAL_TABLET | ORAL | 3 refills | Status: AC

## 2023-09-08 NOTE — Telephone Encounter (Signed)
*  STAT* If patient is at the pharmacy, call can be transferred to refill team.   1. Which medications need to be refilled? (please list name of each medication and dose if known) apixaban (ELIQUIS) 5 MG TABS tablet  atorvastatin (LIPITOR) 40 MG tablet  furosemide (LASIX) 40 MG tablet  2. Would you like to learn more about the convenience, safety, & potential cost savings by using the [facility]? No   3. Are you open to using the [facility] (Type [facility]. ) No   4. Which pharmacy/location (including street and city if local pharmacy) is medication to be sent to? [facility] - [place], [place] - [address]    5. Do they need a 30 day or 90 day supply? 90 day  Prior refill request sent to wrong pharmacy

## 2023-09-08 NOTE — Telephone Encounter (Signed)
 Refill was sent yesterday but to the wrong Pharmacy. Please send the RX to [facility]

## 2023-09-13 ENCOUNTER — Encounter: Payer: Medicare Other | Attending: Sports Medicine | Admitting: Internal Medicine

## 2023-09-13 VITALS — BP 149/52 | HR 100 | Temp 98.0°F | Resp 16

## 2023-09-13 DIAGNOSIS — M810 Age-related osteoporosis without current pathological fracture: Secondary | ICD-10-CM | POA: Diagnosis not present

## 2023-09-13 MED ORDER — ZOLEDRONIC ACID 5 MG/100ML IV SOLN
5.0000 mg | Freq: Once | INTRAVENOUS | Status: AC
  Administered 2023-09-13: 5 mg via INTRAVENOUS

## 2023-09-13 MED ORDER — DIPHENHYDRAMINE HCL 25 MG PO CAPS: 25.0000 mg | ORAL_CAPSULE | Freq: Once | ORAL | Status: DC

## 2023-09-13 MED ORDER — SODIUM CHLORIDE 0.9 % IV SOLN: INTRAVENOUS | Status: DC

## 2023-09-13 MED ORDER — ACETAMINOPHEN 325 MG PO TABS: 650.0000 mg | ORAL_TABLET | Freq: Once | ORAL | Status: DC

## 2023-09-13 NOTE — Progress Notes (Signed)
 Diagnosis: Osteoporosis  Provider:  [name] MD  Procedure: IV Infusion  IV Type: Peripheral, IV Location: L Hand  Reclast (Zolendronic Acid), Dose: 5 mg  Infusion Start Time: 1115  Infusion Stop Time: 1200  Post Infusion IV Care: Observation period completed  Discharge: Condition: Good, Destination: Home . AVS Provided  Performed by:  Cleotilde Neer, LPN

## 2023-09-14 LAB — COMPREHENSIVE METABOLIC PANEL
ALT: 20 IU/L (ref 0–32)
AST: 13 IU/L (ref 0–40)
Albumin: 4.2 g/dL (ref 3.8–4.8)
Alkaline Phosphatase: 90 IU/L (ref 44–121)
BUN/Creatinine Ratio: 15 (ref 12–28)
BUN: 15 mg/dL (ref 8–27)
Bilirubin Total: 0.5 mg/dL (ref 0.0–1.2)
CO2: 22 mmol/L (ref 20–29)
Calcium: 9.5 mg/dL (ref 8.7–10.3)
Chloride: 99 mmol/L (ref 96–106)
Creatinine, Ser: 0.97 mg/dL (ref 0.57–1.00)
Globulin, Total: 2.4 g/dL (ref 1.5–4.5)
Glucose: 105 mg/dL — ABNORMAL HIGH (ref 70–99)
Potassium: 4.7 mmol/L (ref 3.5–5.2)
Sodium: 139 mmol/L (ref 134–144)
Total Protein: 6.6 g/dL (ref 6.0–8.5)
eGFR: 62 mL/min/{1.73_m2} (ref >59)

## 2023-09-14 LAB — PRO B NATRIURETIC PEPTIDE: NT-Pro BNP: 426 pg/mL — ABNORMAL HIGH (ref 0–301)

## 2023-09-14 LAB — VITAMIN D, 25-HYDROXY, TOTAL: Vitamin D, 25-Hydroxy, Serum: 47 ng/mL

## 2023-09-23 DIAGNOSIS — Z939 Artificial opening status, unspecified: Secondary | ICD-10-CM | POA: Diagnosis not present

## 2023-09-27 DIAGNOSIS — B029 Zoster without complications: Secondary | ICD-10-CM | POA: Diagnosis not present

## 2023-09-27 DIAGNOSIS — I110 Hypertensive heart disease with heart failure: Secondary | ICD-10-CM | POA: Diagnosis not present

## 2023-09-27 DIAGNOSIS — E6609 Other obesity due to excess calories: Secondary | ICD-10-CM | POA: Diagnosis not present

## 2023-09-27 DIAGNOSIS — L2089 Other atopic dermatitis: Secondary | ICD-10-CM | POA: Diagnosis not present

## 2023-09-27 DIAGNOSIS — M5430 Sciatica, unspecified side: Secondary | ICD-10-CM | POA: Diagnosis not present

## 2023-09-27 DIAGNOSIS — M810 Age-related osteoporosis without current pathological fracture: Secondary | ICD-10-CM | POA: Diagnosis not present

## 2023-09-27 DIAGNOSIS — Z1331 Encounter for screening for depression: Secondary | ICD-10-CM | POA: Diagnosis not present

## 2023-09-27 DIAGNOSIS — M069 Rheumatoid arthritis, unspecified: Secondary | ICD-10-CM | POA: Diagnosis not present

## 2023-09-27 DIAGNOSIS — M0579 Rheumatoid arthritis with rheumatoid factor of multiple sites without organ or systems involvement: Secondary | ICD-10-CM | POA: Diagnosis not present

## 2023-09-27 DIAGNOSIS — I10 Essential (primary) hypertension: Secondary | ICD-10-CM | POA: Diagnosis not present

## 2023-09-27 DIAGNOSIS — Z0001 Encounter for general adult medical examination with abnormal findings: Secondary | ICD-10-CM | POA: Diagnosis not present

## 2023-09-27 DIAGNOSIS — Z6837 Body mass index (BMI) 37.0-37.9, adult: Secondary | ICD-10-CM | POA: Diagnosis not present

## 2023-10-24 ENCOUNTER — Other Ambulatory Visit (HOSPITAL_COMMUNITY): Payer: Self-pay

## 2023-10-24 ENCOUNTER — Ambulatory Visit: Payer: Medicare Other | Attending: Cardiology | Admitting: Pharmacist

## 2023-10-24 ENCOUNTER — Telehealth: Payer: Self-pay | Admitting: Pharmacist

## 2023-10-24 ENCOUNTER — Encounter: Payer: Self-pay | Admitting: Pharmacist

## 2023-10-24 ENCOUNTER — Telehealth: Payer: Self-pay | Admitting: Pharmacy Technician

## 2023-10-24 VITALS — Ht 63.0 in | Wt 215.0 lb

## 2023-10-24 DIAGNOSIS — E669 Obesity, unspecified: Secondary | ICD-10-CM | POA: Diagnosis not present

## 2023-10-24 MED ORDER — WEGOVY 0.25 MG/0.5ML ~~LOC~~ SOAJ
0.2500 mg | SUBCUTANEOUS | 0 refills | Status: DC
  Filled 2023-10-24: qty 2, 28d supply, fill #0

## 2023-10-24 NOTE — Addendum Note (Signed)
 Addended by: Thaddeus Evitts K on: 10/24/2023 02:30 PM   Modules accepted: Orders

## 2023-10-24 NOTE — Telephone Encounter (Signed)
 Pharmacy Patient Advocate Encounter  Received notification from [facility] that Prior Authorization for Cindy Holmes has been APPROVED from 10/24/23 to 07/11/24. Ran test claim, Copay is $309.79- one month. This test claim was processed through [facility]- copay amounts may vary at other pharmacies due to pharmacy/plan contracts, or as the patient moves through the different stages of their insurance plan.   PA #/Case ID/Reference #: [id number]

## 2023-10-24 NOTE — Progress Notes (Signed)
 Patient ID: Cindy Holmes                 DOB: [birth year]                    MRN: [id number]     HPI: Cindy Holmes is a 73 y.o. female patient referred to pharmacy clinic by Dr.Patwardhan to initiate GLP1-RA therapy. 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 Most recent BMI 36.85 kg/m .  Baseline weight and BMI: 208 lbs 36.85 kg/m  Current weight and BMI: 215 36.85 kg/m  Current meds that affect weight: Furosemide   Due to CHF her weight varies between 207-215 depends on her diuretic dose and water retention    Diet:  Breakfast: eggs, bacon tomatoes Lunch: protein shakes, fruits- oranges, grapes  Dinner: meat(mainly fish and lean meat )salads and sides like roasted potatoes,  Snack: chips and cheese dip, apple and oranges  Drink: water, diet drinks, regular drinks 2-3 times a week     Exercise: try to stay active,despite restricted mobility, she fell multiple time in the past that lead to multiple joint injuries and surgeries   Family History:      Relation Problem Comments  Mother (Deceased) Heart attack   Heart disease     Father (Deceased) Arthritis/Rheumatoid   Cirrhosis hepatic cirrhosis    Sister Metallurgist) Breast cancer (Age: 36)   Diabetes     Brother Metallurgist) Diabetes   Heart disease     Brother (Deceased) Heart disease     Maternal Aunt (Deceased) Heart attack   Heart disease   Throat cancer     Maternal Uncle (Deceased) Heart attack     Maternal Grandmother (Deceased) Colon cancer     Maternal Grandfather (Deceased) Throat cancer     Paternal Grandmother (Deceased)   Paternal Grandfather (Deceased)      Social History:  Alcohol: none  Smoking: never  Labs: Lab Results  Component Value Date   HGBA1C 5.3 03/07/2023    Wt Readings from Last 1 Encounters:  05/30/23 208 lb (94.3 kg)    BP  Readings from Last 1 Encounters:  09/13/23 (!) 149/52   Pulse Readings from Last 1 Encounters:  09/13/23 100       Component Value Date/Time   CHOL 167 09/07/2023 1206   TRIG 87 09/07/2023 1206   HDL 98 09/07/2023 1206   CHOLHDL 1.7 09/07/2023 1206   CHOLHDL 2.3 03/12/2016 0252   VLDL 30 03/12/2016 0252   LDLCALC 53 09/07/2023 1206    Past Medical History:  Diagnosis Date   CAD (coronary artery disease)    a. PTCA LAD 2001/cath 2002-prox and mid LAD stents patent, PTCA ostium Dx 1 b. cath 03/2016: patent LAD stent with less than 40% in-stent restenosis, 10-30% stenosis of D1 and distal LAD with continued medical management recommended. 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    Current Outpatient Medications on File Prior to Visit  Medication Sig Dispense Refill   acetaminophen (TYLENOL) 500 MG tablet Take 1,000 mg by mouth every 6 (six) hours as needed for headache (pain).     allopurinol (ZYLOPRIM) 100 MG tablet Take 1 tablet (100 mg total) by mouth daily. Resume this at 100 mg daily follow with your PCP to titrate this back up as needed.     apixaban (ELIQUIS) 5 MG TABS tablet Take 1 tablet (5 mg total) by mouth 2 (two) times daily. 180 tablet 1   aspirin EC 81 MG tablet Take 81 mg by mouth at bedtime. Swallow whole.     atorvastatin (LIPITOR) 40 MG tablet Take 2 tablets (80 mg total) by mouth daily 180 tablet 3   esomeprazole (NEXIUM) 20 MG capsule Take 20  mg by mouth daily at 12 noon.     ezetimibe (ZETIA) 10 MG tablet Take 1 tablet (10 mg total) by mouth daily. 90 tablet 3   ferrous sulfate 325 (65 FE) MG tablet Take 1 tablet (325 mg total) by mouth every other day. Follow with your PCP to follow your iron deficiency outpatient. 100 tablet 0   folic acid (FOLVITE) 1 MG tablet Take 1 mg by mouth at bedtime.     furosemide (LASIX) 40 MG tablet Take 1 tablet (40 mg total) by mouth 2 (two) times daily. 180 tablet 3   methotrexate (RHEUMATREX) 2.5 MG tablet Take 10 mg by mouth every Sunday.     metoprolol tartrate (LOPRESSOR) 25 MG tablet Take 1 tablet (25 mg total) by mouth every morning. & 50 mg in the evening 270 tablet 2   potassium chloride SA (KLOR-CON M) 20 MEQ tablet Take 1 tablet (20 mEq total) by mouth 2 (two) times daily. 60 tablet 7   predniSONE (DELTASONE) 5 MG tablet Take 5 mg by mouth daily with breakfast.     spironolactone (ALDACTONE) 25 MG tablet Take 1 tablet (25 mg total) by mouth daily. 90 tablet 1   zolpidem (AMBIEN) 5 MG tablet Take 5 mg by mouth at bedtime.     No current facility-administered medications on file prior to visit.    Allergies  Allergen Reactions   Codeine Phosphate Shortness Of Breath   Morphine And Codeine Nausea And Vomiting   Amoxicillin Nausea And Vomiting   Penicillins Nausea And Vomiting    Has patient had a PCN reaction causing immediate rash, facial/tongue/throat swelling, SOB or lightheadedness with hypotension:YES Has patient had a PCN reaction causing severe rash involving mucus membranes or skin necrosis: NO Has patient had a PCN reaction that required hospitalization NO Has patient had a PCN reaction occurring within the last 10 years: NO If all of the above answers are "NO", then may proceed with Cephalosporin use.   Methotrexate Nausea Only   Lidocaine Rash    Reaction to lidocaine patch     Assessment/Plan:  1. Weight loss - Patient has not met goal of at least 5% of body weight  loss with comprehensive lifestyle modifications alone in the past 3-6 months. Due to overweight patient mobility is limited and unable to walk with out walker and scooter. Pharmacotherapy is appropriate to pursue as augmentation. Will start [facility] coverage assessment. Due to  hx sigmoid colectomy patient made aware that GLP-1 RAs can also cause gastrointestinal motility issues,  potentially increasing the risk of intestinal obstruction, especially in patients with a history of gut resection. Patient agree with the treatment. Confirmed patient has  no personal or family history of medullary thyroid carcinoma (MTC) or Multiple Endocrine Neoplasia syndrome type 2 (MEN 2). Injection technique reviewed at today's visit.  Advised patient on common side effects including nausea, diarrhea, dyspepsia, decreased appetite, and fatigue. Counseled patient on reducing meal size and how to titrate medication to minimize side effects. Counseled patient to call if intolerable side effects or if experiencing dehydration, abdominal pain, or dizziness. Patient will adhere to dietary modifications and will target at least 150 minutes of moderate intensity exercise weekly.   Follow up in 1 month via telephone for tolerability update and dose titration.

## 2023-10-24 NOTE — Telephone Encounter (Signed)
 PA approved see other encounter for more info

## 2023-10-24 NOTE — Telephone Encounter (Signed)
 Patient informed, will follow up in 3-4 weeks for further dose titration.

## 2023-10-24 NOTE — Telephone Encounter (Signed)
 Pharmacy Patient Advocate Encounter   Received notification from Pt Calls Messages that prior authorization for [facility] is required/requested.   Insurance verification completed.   The patient is insured through [employer] .   Per test claim: PA required; PA submitted to above mentioned insurance via CoverMyMeds Key/confirmation #/EOC  [id number] Status is pending

## 2023-10-24 NOTE — Patient Instructions (Signed)
GLP1 Agonist Titration Plan:  Will plan to follow the titration plan as below, pending patient is tolerating each dose before increasing to the next. Can slow titration if needed for tolerability.   Wegovy:  -Month 1: Inject Wegovy 0.25 mg once weekly x 4 weeks -Month 2: Inject Wegovy 0.5 mg once weekly x 4 weeks -Month 3: Inject Wegovy 1 mg once weekly x 4 weeks -Month 4: Inject Wegovy 1.7mg SQ once weekly x 4 weeks -Month 5+: Inject Wegovy 2.4mg SQ once weekly     

## 2023-11-17 ENCOUNTER — Telehealth: Payer: Self-pay | Admitting: Pharmacist

## 2023-11-17 NOTE — Telephone Encounter (Signed)
 Pt c/o medication issue:  1. Name of Medication:   Semaglutide -Weight Management (WEGOVY ) 0.25 MG/0.5ML SOAJ    2. How are you currently taking this medication (dosage and times per day)? Inject 0.25 mg into the skin once a week.   3. Are you having a reaction (difficulty breathing--STAT)? No  4. What is your medication issue? Patient is requesting to speak with the pharmacist Nickola Baron in regard to this medication. Patient would like to know if she should increase the dosage of the medication. Please advise.

## 2023-11-18 ENCOUNTER — Other Ambulatory Visit (HOSPITAL_COMMUNITY): Payer: Self-pay

## 2023-11-18 MED ORDER — WEGOVY 0.5 MG/0.5ML ~~LOC~~ SOAJ
0.5000 mg | SUBCUTANEOUS | 0 refills | Status: DC
  Filled 2023-11-18: qty 2, 28d supply, fill #0

## 2023-11-18 NOTE — Telephone Encounter (Signed)
 Spoke to pt, tolerates Wegovy  0.25 mg dose well without side effects. Will up the dose to 0.5 mg once a week. F/u via phone in 4 weeks

## 2023-11-25 DIAGNOSIS — M810 Age-related osteoporosis without current pathological fracture: Secondary | ICD-10-CM | POA: Diagnosis not present

## 2023-11-25 DIAGNOSIS — R7309 Other abnormal glucose: Secondary | ICD-10-CM | POA: Diagnosis not present

## 2023-11-25 DIAGNOSIS — E7849 Other hyperlipidemia: Secondary | ICD-10-CM | POA: Diagnosis not present

## 2023-11-25 DIAGNOSIS — M069 Rheumatoid arthritis, unspecified: Secondary | ICD-10-CM | POA: Diagnosis not present

## 2023-11-25 DIAGNOSIS — Z0001 Encounter for general adult medical examination with abnormal findings: Secondary | ICD-10-CM | POA: Diagnosis not present

## 2023-11-25 DIAGNOSIS — I4891 Unspecified atrial fibrillation: Secondary | ICD-10-CM | POA: Diagnosis not present

## 2023-11-29 DIAGNOSIS — H43813 Vitreous degeneration, bilateral: Secondary | ICD-10-CM | POA: Diagnosis not present

## 2023-11-29 DIAGNOSIS — H25813 Combined forms of age-related cataract, bilateral: Secondary | ICD-10-CM | POA: Diagnosis not present

## 2023-12-02 ENCOUNTER — Other Ambulatory Visit (HOSPITAL_COMMUNITY): Payer: Self-pay

## 2023-12-15 ENCOUNTER — Telehealth: Payer: Self-pay | Admitting: Pharmacist

## 2023-12-15 ENCOUNTER — Other Ambulatory Visit (HOSPITAL_COMMUNITY): Payer: Self-pay

## 2023-12-15 DIAGNOSIS — I2510 Atherosclerotic heart disease of native coronary artery without angina pectoris: Secondary | ICD-10-CM

## 2023-12-15 DIAGNOSIS — D72829 Elevated white blood cell count, unspecified: Secondary | ICD-10-CM | POA: Diagnosis not present

## 2023-12-15 DIAGNOSIS — E669 Obesity, unspecified: Secondary | ICD-10-CM

## 2023-12-15 MED ORDER — WEGOVY 0.5 MG/0.5ML ~~LOC~~ SOAJ
0.5000 mg | SUBCUTANEOUS | 0 refills | Status: DC
Start: 2023-12-15 — End: 2024-01-10
  Filled 2023-12-15: qty 2, 28d supply, fill #0

## 2023-12-15 NOTE — Telephone Encounter (Signed)
*  STAT* If patient is at the pharmacy, call can be transferred to refill team.   1. Which medications need to be refilled? (please list name of each medication and dose if known) Semaglutide -Weight Management (WEGOVY ) 0.5 MG/0.5ML SOAJ    2. Would you like to learn more about the convenience, safety, & potential cost savings by using the [facility]?    3. Are you open to using the [facility] (Type [facility].  ).   4. Which pharmacy/location (including street and city if local pharmacy) is medication to be sent to? [facility]   5. Do they need a 30 day or 90 day supply? 30 day

## 2023-12-21 DIAGNOSIS — R7989 Other specified abnormal findings of blood chemistry: Secondary | ICD-10-CM | POA: Diagnosis not present

## 2023-12-27 DIAGNOSIS — H40053 Ocular hypertension, bilateral: Secondary | ICD-10-CM | POA: Diagnosis not present

## 2023-12-27 DIAGNOSIS — H43813 Vitreous degeneration, bilateral: Secondary | ICD-10-CM | POA: Diagnosis not present

## 2023-12-27 DIAGNOSIS — H25813 Combined forms of age-related cataract, bilateral: Secondary | ICD-10-CM | POA: Diagnosis not present

## 2023-12-27 DIAGNOSIS — H52223 Regular astigmatism, bilateral: Secondary | ICD-10-CM | POA: Diagnosis not present

## 2024-01-04 ENCOUNTER — Other Ambulatory Visit: Payer: Self-pay | Admitting: Internal Medicine

## 2024-01-04 DIAGNOSIS — M810 Age-related osteoporosis without current pathological fracture: Secondary | ICD-10-CM

## 2024-01-04 DIAGNOSIS — E782 Mixed hyperlipidemia: Secondary | ICD-10-CM

## 2024-01-04 DIAGNOSIS — I5032 Chronic diastolic (congestive) heart failure: Secondary | ICD-10-CM

## 2024-01-04 DIAGNOSIS — I4891 Unspecified atrial fibrillation: Secondary | ICD-10-CM | POA: Diagnosis not present

## 2024-01-04 DIAGNOSIS — Z79899 Other long term (current) drug therapy: Secondary | ICD-10-CM

## 2024-01-04 DIAGNOSIS — E669 Obesity, unspecified: Secondary | ICD-10-CM

## 2024-01-04 DIAGNOSIS — R5381 Other malaise: Secondary | ICD-10-CM | POA: Diagnosis not present

## 2024-01-04 DIAGNOSIS — D6869 Other thrombophilia: Secondary | ICD-10-CM

## 2024-01-04 DIAGNOSIS — M159 Polyosteoarthritis, unspecified: Secondary | ICD-10-CM | POA: Diagnosis not present

## 2024-01-04 DIAGNOSIS — I10 Essential (primary) hypertension: Secondary | ICD-10-CM | POA: Diagnosis not present

## 2024-01-04 DIAGNOSIS — I2510 Atherosclerotic heart disease of native coronary artery without angina pectoris: Secondary | ICD-10-CM

## 2024-01-04 DIAGNOSIS — M0579 Rheumatoid arthritis with rheumatoid factor of multiple sites without organ or systems involvement: Secondary | ICD-10-CM | POA: Diagnosis not present

## 2024-01-04 DIAGNOSIS — E271 Primary adrenocortical insufficiency: Secondary | ICD-10-CM | POA: Diagnosis not present

## 2024-01-04 DIAGNOSIS — M069 Rheumatoid arthritis, unspecified: Secondary | ICD-10-CM | POA: Diagnosis not present

## 2024-01-04 DIAGNOSIS — N179 Acute kidney failure, unspecified: Secondary | ICD-10-CM

## 2024-01-04 DIAGNOSIS — M059 Rheumatoid arthritis with rheumatoid factor, unspecified: Secondary | ICD-10-CM

## 2024-01-10 ENCOUNTER — Other Ambulatory Visit (HOSPITAL_COMMUNITY): Payer: Self-pay

## 2024-01-10 ENCOUNTER — Other Ambulatory Visit: Payer: Self-pay | Admitting: Pharmacist

## 2024-01-10 DIAGNOSIS — E669 Obesity, unspecified: Secondary | ICD-10-CM

## 2024-01-10 DIAGNOSIS — I2510 Atherosclerotic heart disease of native coronary artery without angina pectoris: Secondary | ICD-10-CM

## 2024-01-12 ENCOUNTER — Other Ambulatory Visit: Payer: Self-pay | Admitting: Pharmacist

## 2024-01-12 ENCOUNTER — Other Ambulatory Visit (HOSPITAL_COMMUNITY): Payer: Self-pay

## 2024-01-12 DIAGNOSIS — I2510 Atherosclerotic heart disease of native coronary artery without angina pectoris: Secondary | ICD-10-CM

## 2024-01-12 DIAGNOSIS — E669 Obesity, unspecified: Secondary | ICD-10-CM

## 2024-01-12 MED ORDER — WEGOVY 0.5 MG/0.5ML ~~LOC~~ SOAJ
0.5000 mg | SUBCUTANEOUS | 3 refills | Status: DC
  Filled 2024-01-12: qty 2, 28d supply, fill #0

## 2024-01-12 NOTE — Telephone Encounter (Addendum)
 error

## 2024-01-12 NOTE — Telephone Encounter (Signed)
 Pt calling to f/u on refill request that was done on 6/5. Pt will be out of medication on Monday. Please advise

## 2024-01-16 ENCOUNTER — Other Ambulatory Visit (HOSPITAL_COMMUNITY): Payer: Self-pay

## 2024-01-17 NOTE — H&P (Signed)
 Surgical History & Physical  Patient Name: Cindy Holmes  DOB: 06-22-1951  Surgery: Cataract extraction with intraocular lens implant phacoemulsification; Right Eye Surgeon: Marsa Cleverly MD Surgery Date: 01/24/2024 Pre-Op Date: 12/27/2023  HPI: A 75 Yr. old female patient 1.  The patient is present today for a Cataract Evaluation . The patient complains of difficulty when reading fine print, books, newspaper, instructions etc., which began 1 year ago. Both eyes are affected. The episode is constant. The patient describes foggy symptoms affecting their eyes/vision. This is negatively affecting the patient's quality of life and the patient is unable to function adequately in life with the current level of vision.  Medical History:  Heart Problem High Blood Pressure Stroke   Review of Systems Cardiovascular Hypertension, heart problems Neurological Stroke  Social Never Smoked  Medication None  Sx/Procedures Colonoscopy   Drug Allergies  Codeine sulfate, Penicillin  History & Physical: Heent: cataracts NECK: supple without bruits LUNGS: lungs clear to auscultation CV: regular rate and rhythm Abdomen: soft and non-tender  Impression & Plan: Assessment: 1.  CATARACT AGE-RELATED COMBINED FORMS; Both Eyes (H25.813) 2.  Posterior Vitreous Detachment; Both Eyes (H43.813) 3.  ASTIGMATISM REGULAR/Order Refractive Surgery; Both Eyes (H52.223) 4.  Ocular Hypertension; Both Eyes (H40.053)  Plan: 1.  Cataracts are visually significant and account for the patient's complaints. Discussed all risks, benefits, procedures and recovery, including infection, loss of vision and eye, need for glasses after surgery or additional procedures. Patient understands changing glasses will not improve vision. Patient indicated understanding of procedure. All questions answered. Patient desires to have surgery, recommend phacoemulsification with intraocular lens. Patient to have preliminary testing  necessary ([employer]os/IOL Master, Mac OCT, TOPO) Educational materials provided: Cataract.  Plan: - Proceed with cataract surgery OD followed by OS - Plan for -2.00 target with DIB00 lens - No DM, no fuchs, no prior eye surgery - good dilation - Dextenza  if available  2.  No retinal tear or retinal detachment. I told the patient to contact me ASAP for new onset/worsening of floaters, flashes, or vision loss.  3.  Will need glasses after cataract surgery, discussed this pre-operatively  4.  Tmax 22/23. Healthy optic nerve appearance. Continue to monitor

## 2024-01-18 ENCOUNTER — Encounter (HOSPITAL_COMMUNITY): Payer: Self-pay

## 2024-01-18 ENCOUNTER — Encounter (HOSPITAL_COMMUNITY)
Admission: RE | Admit: 2024-01-18 | Discharge: 2024-01-18 | Disposition: A | Discharge status: Home / Self Care | Source: Ambulatory Visit | Attending: Optometry | Admitting: Optometry

## 2024-01-18 DIAGNOSIS — H2511 Age-related nuclear cataract, right eye: Secondary | ICD-10-CM | POA: Diagnosis not present

## 2024-01-23 NOTE — Anesthesia Preprocedure Evaluation (Signed)
 Anesthesia Evaluation  Patient identified by MRN, date of birth, ID band Patient awake    Reviewed: Allergy & Precautions, H&P , NPO status , Patient's Chart, lab work & pertinent test results, reviewed documented beta blocker date and time   History of Anesthesia Complications (+) PONV and history of anesthetic complications  Airway Mallampati: II  TM Distance: >3 FB Neck ROM: full    Dental no notable dental hx. (+) Dental Advisory Given, Teeth Intact   Pulmonary neg pulmonary ROS   Pulmonary exam normal breath sounds clear to auscultation       Cardiovascular hypertension, + CAD and +CHF  Normal cardiovascular exam+ Valvular Problems/Murmurs AS  Rhythm:regular Rate:Normal  Good Ef. ? Diastolic function ( history of diastolic CHF ) Mild AS   Neuro/Psych  PSYCHIATRIC DISORDERS      Carotid stenosis  Neuromuscular disease CVA    GI/Hepatic negative GI ROS, Neg liver ROS,,,  Endo/Other  negative endocrine ROS    Renal/GU Renal InsufficiencyRenal disease  negative genitourinary   Musculoskeletal  (+) Arthritis , Rheumatoid disorders,    Abdominal   Peds  Hematology negative hematology ROS (+)   Anesthesia Other Findings   Reproductive/Obstetrics negative OB ROS                              Anesthesia Physical Anesthesia Plan  ASA: 3  Anesthesia Plan: MAC   Post-op Pain Management: Minimal or no pain anticipated   Induction:   PONV Risk Score and Plan:   Airway Management Planned: Nasal Cannula and Natural Airway  Additional Equipment: None  Intra-op Plan:   Post-operative Plan:   Informed Consent: I have reviewed the patients History and Physical, chart, labs and discussed the procedure including the risks, benefits and alternatives for the proposed anesthesia with the patient or authorized representative who has indicated his/her understanding and acceptance.     Dental  Advisory Given  Plan Discussed with: CRNA  Anesthesia Plan Comments:          Anesthesia Quick Evaluation

## 2024-01-24 ENCOUNTER — Encounter (HOSPITAL_COMMUNITY)
Admission: RE | Disposition: A | Discharge status: Home / Self Care | Payer: Self-pay | Source: Home / Self Care | Attending: Optometry

## 2024-01-24 ENCOUNTER — Ambulatory Visit (HOSPITAL_COMMUNITY)
Admission: RE | Admit: 2024-01-24 | Discharge: 2024-01-24 | Disposition: A | Discharge status: Home / Self Care | Attending: Optometry | Admitting: Optometry

## 2024-01-24 ENCOUNTER — Ambulatory Visit (HOSPITAL_BASED_OUTPATIENT_CLINIC_OR_DEPARTMENT_OTHER): Payer: Self-pay | Admitting: Anesthesiology

## 2024-01-24 ENCOUNTER — Other Ambulatory Visit: Payer: Self-pay

## 2024-01-24 ENCOUNTER — Encounter (HOSPITAL_COMMUNITY): Payer: Self-pay | Admitting: Anesthesiology

## 2024-01-24 ENCOUNTER — Encounter (HOSPITAL_COMMUNITY): Payer: Self-pay | Admitting: Optometry

## 2024-01-24 DIAGNOSIS — I110 Hypertensive heart disease with heart failure: Secondary | ICD-10-CM | POA: Insufficient documentation

## 2024-01-24 DIAGNOSIS — H25811 Combined forms of age-related cataract, right eye: Secondary | ICD-10-CM | POA: Diagnosis present

## 2024-01-24 DIAGNOSIS — H43813 Vitreous degeneration, bilateral: Secondary | ICD-10-CM | POA: Diagnosis not present

## 2024-01-24 DIAGNOSIS — I509 Heart failure, unspecified: Secondary | ICD-10-CM | POA: Insufficient documentation

## 2024-01-24 DIAGNOSIS — I2510 Atherosclerotic heart disease of native coronary artery without angina pectoris: Secondary | ICD-10-CM

## 2024-01-24 DIAGNOSIS — M069 Rheumatoid arthritis, unspecified: Secondary | ICD-10-CM | POA: Insufficient documentation

## 2024-01-24 DIAGNOSIS — H25813 Combined forms of age-related cataract, bilateral: Secondary | ICD-10-CM | POA: Diagnosis not present

## 2024-01-24 DIAGNOSIS — I350 Nonrheumatic aortic (valve) stenosis: Secondary | ICD-10-CM | POA: Diagnosis not present

## 2024-01-24 DIAGNOSIS — H40053 Ocular hypertension, bilateral: Secondary | ICD-10-CM | POA: Insufficient documentation

## 2024-01-24 DIAGNOSIS — H2511 Age-related nuclear cataract, right eye: Secondary | ICD-10-CM

## 2024-01-24 DIAGNOSIS — I5032 Chronic diastolic (congestive) heart failure: Secondary | ICD-10-CM | POA: Diagnosis not present

## 2024-01-24 DIAGNOSIS — H5711 Ocular pain, right eye: Secondary | ICD-10-CM | POA: Diagnosis not present

## 2024-01-24 DIAGNOSIS — H52223 Regular astigmatism, bilateral: Secondary | ICD-10-CM | POA: Diagnosis not present

## 2024-01-24 HISTORY — PX: INSERTION, STENT, DRUG-ELUTING, LACRIMAL CANALICULUS: SHX7453

## 2024-01-24 HISTORY — PX: CATARACT EXTRACTION W/PHACO: SHX586

## 2024-01-24 SURGERY — PHACOEMULSIFICATION, CATARACT, WITH IOL INSERTION
Anesthesia: Monitor Anesthesia Care | Site: Eye | Laterality: Right

## 2024-01-24 MED ORDER — PHENYLEPHRINE HCL 2.5 % OP SOLN
1.0000 [drp] | OPHTHALMIC | Status: AC
  Administered 2024-01-24 (×3): 1 [drp] via OPHTHALMIC

## 2024-01-24 MED ORDER — LIDOCAINE HCL (PF) 1 % IJ SOLN
INTRAMUSCULAR | Status: DC | PRN
  Administered 2024-01-24: 1 mL

## 2024-01-24 MED ORDER — STERILE WATER FOR IRRIGATION IR SOLN
Status: DC | PRN
Start: 2024-01-24 — End: 2024-01-24
  Administered 2024-01-24: 250 mL

## 2024-01-24 MED ORDER — DEXAMETHASONE 0.4 MG OP INST
VAGINAL_INSERT | OPHTHALMIC | Status: AC
  Filled 2024-01-24: qty 1

## 2024-01-24 MED ORDER — PHENYLEPHRINE-KETOROLAC 1-0.3 % IO SOLN
INTRAOCULAR | Status: DC | PRN
  Administered 2024-01-24: 500 mL via OPHTHALMIC

## 2024-01-24 MED ORDER — LIDOCAINE HCL 3.5 % OP GEL
1.0000 | Freq: Once | OPHTHALMIC | Status: AC
  Administered 2024-01-24: 1 via OPHTHALMIC

## 2024-01-24 MED ORDER — TROPICAMIDE 1 % OP SOLN
1.0000 [drp] | OPHTHALMIC | Status: AC
  Administered 2024-01-24 (×3): 1 [drp] via OPHTHALMIC

## 2024-01-24 MED ORDER — SIGHTPATH DOSE#1 NA HYALUR & NA CHOND-NA HYALUR IO KIT
PACK | INTRAOCULAR | Status: DC | PRN
  Administered 2024-01-24: 1 via OPHTHALMIC

## 2024-01-24 MED ORDER — SODIUM CHLORIDE 0.9% FLUSH
INTRAVENOUS | Status: DC | PRN
  Administered 2024-01-24: 5 mL via INTRAVENOUS

## 2024-01-24 MED ORDER — MIDAZOLAM HCL 2 MG/2ML IJ SOLN
INTRAMUSCULAR | Status: DC | PRN
  Administered 2024-01-24: 1 mg via INTRAVENOUS

## 2024-01-24 MED ORDER — MOXIFLOXACIN HCL 5 MG/ML IO SOLN
INTRAOCULAR | Status: DC | PRN
  Administered 2024-01-24: .2 mL via INTRACAMERAL

## 2024-01-24 MED ORDER — DEXAMETHASONE 0.4 MG OP INST
VAGINAL_INSERT | OPHTHALMIC | Status: DC | PRN
  Administered 2024-01-24: .4 mg via OPHTHALMIC

## 2024-01-24 MED ORDER — MIDAZOLAM HCL 2 MG/2ML IJ SOLN
INTRAMUSCULAR | Status: AC
  Filled 2024-01-24: qty 2

## 2024-01-24 MED ORDER — TETRACAINE HCL 0.5 % OP SOLN
1.0000 [drp] | OPHTHALMIC | Status: AC
  Administered 2024-01-24 (×3): 1 [drp] via OPHTHALMIC

## 2024-01-24 MED ORDER — LACTATED RINGERS IV SOLN: INTRAVENOUS | Status: DC

## 2024-01-24 MED ORDER — POVIDONE-IODINE 5 % OP SOLN
OPHTHALMIC | Status: DC | PRN
  Administered 2024-01-24: 1 via OPHTHALMIC

## 2024-01-24 MED ORDER — BSS IO SOLN
INTRAOCULAR | Status: DC | PRN
  Administered 2024-01-24: 15 mL via INTRAOCULAR

## 2024-01-24 SURGICAL SUPPLY — 14 items
CATARACT SUITE SIGHTPATH (MISCELLANEOUS) ×1 IMPLANT
CLOTH BEACON ORANGE TIMEOUT ST (SAFETY) ×1 IMPLANT
DRSG TEGADERM 4X4.75 (GAUZE/BANDAGES/DRESSINGS) ×1 IMPLANT
EYE SHIELD UNIVERSAL CLEAR (GAUZE/BANDAGES/DRESSINGS) IMPLANT
FEE CATARACT SUITE SIGHTPATH (MISCELLANEOUS) ×1 IMPLANT
GLOVE BIOGEL PI IND STRL 7.0 (GLOVE) ×2 IMPLANT
LENS IOL TECNIS EYHANCE 24.0 (Intraocular Lens) IMPLANT
NDL HYPO 18GX1.5 BLUNT FILL (NEEDLE) ×1 IMPLANT
NEEDLE HYPO 18GX1.5 BLUNT FILL (NEEDLE) ×1 IMPLANT
PAD ARMBOARD POSITIONER FOAM (MISCELLANEOUS) ×1 IMPLANT
POSITIONER HEAD 8X9X4 ADT (SOFTGOODS) ×1 IMPLANT
SYR TB 1ML LL NO SAFETY (SYRINGE) ×1 IMPLANT
TAPE SURG TRANSPORE 1 IN (GAUZE/BANDAGES/DRESSINGS) IMPLANT
WATER STERILE IRR 250ML POUR (IV SOLUTION) ×1 IMPLANT

## 2024-01-24 NOTE — Interval H&P Note (Signed)
 History and Physical Interval Note:  01/24/2024 7:11 AM  The H and P was reviewed and updated. The patient was examined.  No changes were found after exam.  The surgical eye was marked.  Cindy Holmes

## 2024-01-24 NOTE — Discharge Instructions (Signed)
 Please discharge patient when stable, will follow up today with Dr. Ilsa Iha at the [facility] office immediately following discharge.  Leave shield in place until visit.  All paperwork with discharge instructions will be given at the office.  [facility] Address:  [address]  [place], [place] [address]  Dr. Chaya Jan Phone: [phone]

## 2024-01-24 NOTE — Anesthesia Postprocedure Evaluation (Signed)
 Anesthesia Post Note  Patient: Cindy Holmes  Procedure(s) Performed: PHACOEMULSIFICATION, CATARACT, WITH IOL INSERTION (Right: Eye) INSERTION, STENT, DRUG-ELUTING, LACRIMAL CANALICULUS (Right: Eye)  Patient location during evaluation: Phase II Anesthesia Type: MAC Level of consciousness: awake and alert Pain management: pain level controlled Vital Signs Assessment: post-procedure vital signs reviewed and stable Respiratory status: spontaneous breathing, nonlabored ventilation and respiratory function stable Cardiovascular status: stable and blood pressure returned to baseline Postop Assessment: no apparent nausea or vomiting Anesthetic complications: no   There were no known notable events for this encounter.   Last Vitals:  Vitals:   01/24/24 0648 01/24/24 0748  BP: (!) 143/68 133/60  Pulse: 76 62  Resp: 14 16  Temp: 36.9 C 37.1 C  SpO2: 99% 100%    Last Pain:  Vitals:   01/24/24 0748  TempSrc: Oral  PainSc: 0-No pain                 Lil Lepage L Alline Pio

## 2024-01-24 NOTE — Transfer of Care (Signed)
 Immediate Anesthesia Transfer of Care Note  Patient: Cindy Holmes  Procedure(s) Performed: PHACOEMULSIFICATION, CATARACT, WITH IOL INSERTION (Right: Eye) INSERTION, STENT, DRUG-ELUTING, LACRIMAL CANALICULUS (Right: Eye)  Patient Location: Short Stay  Anesthesia Type:MAC  Level of Consciousness: awake, alert , and oriented  Airway & Oxygen Therapy: Patient Spontanous Breathing  Post-op Assessment: Report given to RN and Post -op Vital signs reviewed and stable  Post vital signs: Reviewed and stable  Last Vitals:  Vitals Value Taken Time  BP 133/60 01/24/24 07:48  Temp 37.1 C 01/24/24 07:48  Pulse 62 01/24/24 07:48  Resp 16 01/24/24 07:48  SpO2 100 % 01/24/24 07:48    Last Pain:  Vitals:   01/24/24 0748  TempSrc: Oral  PainSc: 0-No pain         Complications: No notable events documented.

## 2024-01-24 NOTE — Op Note (Signed)
 Date of procedure: 01/24/24  Pre-operative diagnosis: Visually significant age-related nuclear cataract, Right Eye (H25.11)  Post-operative diagnosis: Visually significant age-related nuclear cataract, Right Eye H25.11; Ocular Pain and Inflammation, Right eye H57.11  Procedure: Removal of cataract via phacoemulsification and insertion of intra-ocular lens J&J DIBOO +24.0D into the capsular bag of the Right Eye, Dextenza  Implantation into right lower punctum CPT [phone]  Attending surgeon: Marsa Cleverly, MD  Anesthesia: MAC, Topical Akten   Complications: None  Estimated Blood Loss: <32mL (minimal)  Specimens: None  Implants:  Implant Name Type Inv. Item Serial No. Manufacturer Lot No. LRB No. Used Action  LENS IOL TECNIS EYHANCE 24.0 - [id number] Intraocular Lens LENS IOL TECNIS EYHANCE 24.0 [id number] [employer]  Right 1 Implanted    Indications:  Visually significant age-related cataract, Right Eye  Procedure:  The patient was seen and identified in the pre-operative area. The operative eye was identified and dilated.  The operative eye was marked.  Topical anesthesia was administered to the operative eye.     The patient was then to the operative suite and placed in the supine position.  A timeout was performed confirming the patient, procedure to be performed, and all other relevant information.   The patient's face was prepped and draped in the usual fashion for intra-ocular surgery.  A lid speculum was placed into the operative eye and the surgical microscope moved into place and focused.  A superotemporal paracentesis was created using a 20 gauge paracentesis blade.  BSS mixed with Omidria , followed by 1% lidocaine  was injected into the anterior chamber.  Viscoelastic was injected into the anterior chamber.  A temporal clear-corneal main wound incision was created using a 2.110mm microkeratome.  A continuous curvilinear capsulorrhexis was initiated using an irrigating cystitome and  completed using capsulorrhexis forceps.  Hydrodissection and hydrodeliniation were performed.  Viscoelastic was injected into the anterior chamber.  A phacoemulsification handpiece and a chopper as a second instrument were used to remove the nucleus and epinucleus. The irrigation/aspiration handpiece was used to remove any remaining cortical material.   The capsular bag was reinflated with viscoelastic, checked, and found to be intact.  The intraocular lens was inserted into the capsular bag.  The irrigation/aspiration handpiece was used to remove any remaining viscoelastic.  The clear corneal wound and paracentesis wounds were then hydrated and checked with Weck-Cels to be watertight. Moxifloxacin  was instilled into the anterior chamber.  The lid-speculum and drape were removed. The lower punctum was dilated, and the dextenza  implant was inserted into it. The patient's face was cleaned with a wet and dry 4x4. A clear shield was taped over the eye. The patient was taken to the post-operative care unit in good condition, having tolerated the procedure well.  Post-Op Instructions: The patient will follow up at [facility] for a same day post-operative evaluation and will receive all other orders and instructions.

## 2024-01-25 ENCOUNTER — Encounter (HOSPITAL_COMMUNITY): Payer: Self-pay | Admitting: Optometry

## 2024-02-01 ENCOUNTER — Encounter (HOSPITAL_COMMUNITY)
Admission: RE | Admit: 2024-02-01 | Discharge: 2024-02-01 | Disposition: A | Discharge status: Home / Self Care | Source: Ambulatory Visit | Attending: Optometry | Admitting: Optometry

## 2024-02-01 NOTE — Progress Notes (Signed)
 Dr Kendell notified that patient took Wegovy  injection this Monday.  Patient notified to hold Wegovy  for 7 days prior to surgery.  Patient to be rescheduled.

## 2024-02-09 ENCOUNTER — Ambulatory Visit (INDEPENDENT_AMBULATORY_CARE_PROVIDER_SITE_OTHER): Admitting: "Endocrinology

## 2024-02-09 ENCOUNTER — Encounter: Payer: Self-pay | Admitting: "Endocrinology

## 2024-02-09 ENCOUNTER — Telehealth: Payer: Self-pay | Admitting: "Endocrinology

## 2024-02-09 VITALS — BP 112/80 | HR 68 | Ht 63.0 in | Wt 207.0 lb

## 2024-02-09 DIAGNOSIS — R7303 Prediabetes: Secondary | ICD-10-CM | POA: Insufficient documentation

## 2024-02-09 DIAGNOSIS — M810 Age-related osteoporosis without current pathological fracture: Secondary | ICD-10-CM

## 2024-02-09 DIAGNOSIS — E2740 Unspecified adrenocortical insufficiency: Secondary | ICD-10-CM

## 2024-02-09 DIAGNOSIS — E559 Vitamin D deficiency, unspecified: Secondary | ICD-10-CM | POA: Diagnosis not present

## 2024-02-09 DIAGNOSIS — E782 Mixed hyperlipidemia: Secondary | ICD-10-CM

## 2024-02-09 DIAGNOSIS — E2749 Other adrenocortical insufficiency: Secondary | ICD-10-CM | POA: Insufficient documentation

## 2024-02-09 MED ORDER — HYDROCORTISONE 10 MG PO TABS: ORAL_TABLET | ORAL | 1 refills | Status: AC

## 2024-02-09 NOTE — Patient Instructions (Signed)

## 2024-02-09 NOTE — Progress Notes (Signed)
 Endocrinology Consult Note                                            02/09/2024, 6:42 PM   Subjective:    Patient ID: Cindy Holmes, female    DOB: Apr 10, 1951, PCP Bertell Satterfield, MD   Past Medical History:  Diagnosis Date   CAD (coronary artery disease)    a. PTCA LAD 2001/cath 2002-prox and mid LAD stents patent, PTCA ostium Dx 1 b. cath 03/2016: patent LAD stent with less than 40% in-stent restenosis, 10-30% stenosis of D1 and distal LAD with continued medical management recommended. 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   Past Surgical History:  Procedure Laterality Date   BIOPSY  03/04/2023   Procedure: BIOPSY;  Surgeon: Rollin Dover, MD;  Location: [facility] ENDOSCOPY;  Service: Gastroenterology;;   BOWEL RESECTION  03/08/2023   Procedure: SMALL BOWEL RESECTION;  Surgeon: Vanderbilt Ned, MD;  Location: [facility] OR;  Service: General;;   CARDIAC CATHETERIZATION     with stent placement in 2001   CARDIAC CATHETERIZATION N/A 03/12/2016   Procedure: Left Heart Cath and Coronary Angiography;  Surgeon: Lonni Hanson, MD;  Location: [facility] INVASIVE CV LAB;  Service: Cardiovascular;  Laterality: N/A;   CARDIAC CATHETERIZATION N/A 03/12/2016   Procedure: Intravascular Pressure Wire/FFR Study;  Surgeon: Lonni Hanson, MD;  Location: [facility] INVASIVE CV LAB;  Service: Cardiovascular;  Laterality: N/A;   CATARACT EXTRACTION W/PHACO Right 01/24/2024   Procedure:  PHACOEMULSIFICATION, CATARACT, WITH IOL INSERTION;  Surgeon: Juli Blunt, MD;  Location: [facility] ORS;  Service: Ophthalmology;  Laterality: Right;  CDE: 5.63   CHOLECYSTECTOMY     COLECTOMY WITH COLOSTOMY CREATION/HARTMANN PROCEDURE N/A 03/08/2023   Procedure: COLECTOMY WITH COLOSTOMY CREATION/HARTMANN PROCEDURE;  Surgeon: Vanderbilt Ned, MD;  Location: [facility] OR;  Service: General;  Laterality: N/A;   COLONOSCOPY WITH PROPOFOL  N/A 03/04/2023   Procedure: COLONOSCOPY WITH PROPOFOL ;  Surgeon: Rollin Dover, MD;  Location: [facility] ENDOSCOPY;  Service: Gastroenterology;  Laterality: N/A;   CORONARY ANGIOPLASTY     2002   ESOPHAGOGASTRODUODENOSCOPY N/A 12/03/2016   Procedure: ESOPHAGOGASTRODUODENOSCOPY (EGD);  Surgeon: Rollin Dover, MD;  Location[facility] ENDOSCOPY;  Service: Endoscopy;  Laterality: N/A;   INSERTION, STENT, DRUG-ELUTING, LACRIMAL CANALICULUS Right 01/24/2024   Procedure: INSERTION, STENT, DRUG-ELUTING, LACRIMAL CANALICULUS;  Surgeon: Juli Blunt, MD;  Location: [facility] ORS;  Service: Ophthalmology;  Laterality: Right;   LAPAROTOMY N/A 03/08/2023   Procedure: EXPLORATION LAPAROTOMY;  Surgeon: Vanderbilt Ned, MD;  Location: [facility] OR;  Service: General;  Laterality: N/A;   laproscopic adjustable gastric  banding     with APS standard system.    OPEN REDUCTION INTERNAL FIXATION (ORIF) DISTAL RADIAL FRACTURE Right 05/15/2018   Procedure: OPEN REDUCTION INTERNAL FIXATION (ORIF) DISTAL RADIAL  FRACTURE;  Surgeon: Kendal Franky SQUIBB, MD;  Location: [facility] OR;  Service: Orthopedics;  Laterality: Right;   ORIF ANKLE FRACTURE Right 05/15/2018   Procedure: OPEN REDUCTION INTERNAL FIXATION (ORIF) ANKLE FRACTURE;  Surgeon: Kendal Franky SQUIBB, MD;  Location: [facility] OR;  Service: Orthopedics;  Laterality: Right;   ORIF TIBIA PLATEAU Right 05/15/2018   Procedure: OPEN REDUCTION INTERNAL FIXATION (ORIF) TIBIAL PLATEAU;  Surgeon: Kendal Franky SQUIBB, MD;  Location: [facility] OR;  Service: Orthopedics;  Laterality: Right;   Pt has 3 stents      sept 2001   SHOULDER ARTHROSCOPY WITH SUBACROMIAL DECOMPRESSION, ROTATOR CUFF REPAIR AND BICEP TENDON REPAIR Right 11/16/2013   Procedure: RIGHT SHOULDER ARTHROSCOPY, EXTENSIVE DEBRIDEMENT, ROTATOR CUFF REPAIR ;  Surgeon: Fonda SQUIBB Olmsted, MD;  Location: [facility];  Service: Orthopedics;  Laterality: Right;   TUBAL LIGATION     Social History   Socioeconomic History   Marital status: Married    Spouse name: Not on file   Number of children: Not on file   Years of education: Not on file   Highest education level: Not on file  Occupational History   Not on file  Tobacco Use   Smoking status: Never   Smokeless tobacco: Never  Vaping Use   Vaping status: Never Used  Substance and Sexual Activity   Alcohol  use: No    Alcohol /week: 0.0 standard drinks of alcohol    Drug use: No   Sexual activity: Never  Other Topics Concern   Not on file  Social History Narrative   Not on file   Social Drivers of Health   Financial Resource Strain: Low Risk  (05/26/2023)   Overall Financial Resource Strain (CARDIA)    Difficulty of Paying Living Expenses: Not very hard  Food Insecurity: No Food Insecurity (04/21/2023)   Hunger Vital Sign    Worried About Running Out of Food in the Last Year: Never true    Ran Out of Food in the Last Year: Never true  Transportation Needs: No Transportation Needs (05/26/2023)   PRAPARE - Administrator, Civil Service (Medical): No    Lack of Transportation (Non-Medical): No  Physical Activity: Inactive (04/25/2023)   Exercise Vital Sign    Days of Exercise per Week: 0 days    Minutes of Exercise per Session: 0 min  Stress: Not on file  Social Connections: Not on file   Family History  Problem Relation Age of Onset   Heart attack Mother    Heart disease Mother    Hyperlipidemia Mother    Arthritis/Rheumatoid Father    Cirrhosis Father        hepatic cirrhosis   Osteoporosis Father    Breast cancer Sister 41   Diabetes Sister     Diabetes Brother    Heart disease Brother    Heart disease Brother    Heart disease Maternal Aunt    Heart attack Maternal Aunt    Throat cancer Maternal Aunt    Heart attack Maternal Uncle    Colon cancer Maternal Grandmother    Throat cancer Maternal Grandfather    Cancer Other        family history   Heart attack Other        family history   Outpatient Encounter Medications as of 02/09/2024  Medication Sig   aspirin  EC 81 MG tablet Take 81 mg by mouth at bedtime. Swallow whole.   Coenzyme Q10 (COQ10) 100 MG CAPS Take 1 capsule by mouth  daily.   esomeprazole  (NEXIUM ) 20 MG capsule Take 20 mg by mouth daily at 12 noon.   ezetimibe  (ZETIA ) 10 MG tablet Take 1 tablet (10 mg total) by mouth daily.   furosemide  (LASIX ) 40 MG tablet Take 1 tablet (40 mg total) by mouth 2 (two) times daily.   metoprolol  tartrate (LOPRESSOR ) 50 MG tablet Take 50 mg by mouth daily.   Omega 3-6-9 Fatty Acids (OMEGA 3-6-9 PO) Take 1 capsule by mouth daily.   Omega-3 Fatty Acids (FISH OIL) 1200 MG CAPS Take 1 capsule by mouth daily.   Semaglutide -Weight Management (WEGOVY ) 0.5 MG/0.5ML SOAJ Inject 0.5 mg into the skin once a week.   torsemide  (DEMADEX ) 20 MG tablet Take 20 mg by mouth 2 (two) times daily.   [DISCONTINUED] hydrocortisone  (CORTEF ) 10 MG tablet Take 10 mg by mouth daily.   [DISCONTINUED] predniSONE  (DELTASONE ) 5 MG tablet Take 5 mg by mouth daily with breakfast. (Patient taking differently: Take 10 mg by mouth daily with breakfast.)   acetaminophen  (TYLENOL ) 500 MG tablet Take 1,000 mg by mouth every 6 (six) hours as needed for headache (pain). (Patient not taking: Reported on 02/09/2024)   allopurinol  (ZYLOPRIM ) 100 MG tablet Take 1 tablet (100 mg total) by mouth daily. Resume this at 100 mg daily follow with your PCP to titrate this back up as needed.   apixaban  (ELIQUIS ) 5 MG TABS tablet Take 1 tablet (5 mg total) by mouth 2 (two) times daily.   atorvastatin  (LIPITOR ) 40 MG tablet Take 2  tablets (80 mg total) by mouth daily   ferrous sulfate  325 (65 FE) MG tablet Take 1 tablet (325 mg total) by mouth every other day. Follow with your PCP to follow your iron deficiency outpatient. (Patient not taking: Reported on 02/09/2024)   folic acid  (FOLVITE ) 1 MG tablet Take 1 mg by mouth at bedtime.   hydrocortisone  (CORTEF ) 10 MG tablet Take 15 mg (1.5 tablet) daily at 8 AM and 10 mg (1 tablet) daily at noon   hydroxychloroquine  (PLAQUENIL ) 200 MG tablet Take 200 mg by mouth 2 (two) times daily.   methotrexate  (RHEUMATREX) 2.5 MG tablet Take 10 mg by mouth every Sunday.   metoprolol  tartrate (LOPRESSOR ) 25 MG tablet Take 1 tablet (25 mg total) by mouth every morning. & 50 mg in the evening   potassium chloride  SA (KLOR-CON  M) 20 MEQ tablet Take 1 tablet (20 mEq total) by mouth 2 (two) times daily.   spironolactone  (ALDACTONE ) 25 MG tablet TAKE 1 TABLET BY MOUTH DAILY   zolpidem  (AMBIEN ) 5 MG tablet Take 5 mg by mouth at bedtime.   No facility-administered encounter medications on file as of 02/09/2024.   ALLERGIES: Allergies  Allergen Reactions   Codeine Phosphate Shortness Of Breath   Morphine And Codeine Nausea And Vomiting   Amoxicillin Nausea And Vomiting   Penicillins Nausea And Vomiting    Has patient had a PCN reaction causing immediate rash, facial/tongue/throat swelling, SOB or lightheadedness with hypotension:YES Has patient had a PCN reaction causing severe rash involving mucus membranes or skin necrosis: NO Has patient had a PCN reaction that required hospitalization NO Has patient had a PCN reaction occurring within the last 10 years: NO If all of the above answers are NO, then may proceed with Cephalosporin use.   Methotrexate  Nausea Only   Lidocaine  Rash    Reaction to lidocaine  patch    VACCINATION STATUS: There is no immunization history for the selected administration types on file for this patient.  HPI MAJESTY STEHLIN is 73 y.o. female who presents today  with a medical history as above. she is being seen in consultation for adrenal insufficiency requested by Bertell Satterfield, MD.  Patient is accompanied by her husband to clinic.  History is obtained from the family as well as chart review.  Patient with long and complicated medical history including use of steroids high-dose for various rheumatology conditions including rheumatoid arthritis, osteoarthritis, neuromuscular disorders. High exposure to steroids used for decades at a minimum. Over the years, she has gained significant amount of weight associated with deconditioning and large joint arthritis which led her to be wheelchair-bound.  She also has coronary artery disease, CVA, hypertension, hyperlipidemia, obesity, prediabetes. Due to disequilibrium, she underwent lab work in June 2025 which revealed hypocortisolemia that was ANA and PM times. On 08/24/2023 her ACTH  was 6.3, p.m. cortisol was 1.7, a.m. cortisol was 2.9. This led to initiation of hydrocortisone  10 mg p.o. daily on top of her existing prednisone  5 mg p.o. daily.  She previously took prednisone  up to 20 mg p.o. daily on various cycles.   She does not have acute complaints today. She was diagnosed with osteoporosis after documenting vertebral fractures per her history, however she is not on osteoporosis treatment at this time.  She had history of fracture in her ankle, knee, wrist in addition to vertebral fractures. Her medical history includes extensive surgical history including cardiac catheterization, coronary angioplasty, laparoscopic adjustable gastric banding, open reduction and internal fixation of ankle fractures, shoulder arthroscopy with subacromial decompression, cholecystectomy.  She denies nausea, vomiting, diarrhea.  She is sedentary at baseline.  She does not follow any particular diet program at this time. Her medications include atorvastatin , ezetimibe , semaglutide , torsemide , metoprolol , methotrexate ,  hydroxychloroquine , hydrocortisone  10 mg p.o. daily, prednisone  5 mg daily.  She denies adrenal injury, surgery, or ablation.  Review of Systems  Constitutional: +mildly fluctuating body weight , + fatigue, no subjective hyperthermia, no subjective hypothermia, + wheelchair-bound Eyes: no blurry vision, no xerophthalmia ENT: no sore throat, no nodules palpated in throat, no dysphagia/odynophagia, no hoarseness Cardiovascular: no Chest Pain, no Shortness of Breath, no palpitations, no leg swelling Respiratory: no cough, no shortness of breath Gastrointestinal: no Nausea/Vomiting/Diarhhea Musculoskeletal: no muscle/joint aches, + wheelchair-bound Skin: no rashes Neurological: no tremors, no numbness, no tingling, no dizziness Psychiatric: no depression, no anxiety  Objective:       02/09/2024    1:29 PM 01/24/2024    7:48 AM 01/24/2024    6:48 AM  Vitals with BMI  Height 5' 3    Weight 207 lbs    BMI 36.68    Systolic 112 133 856  Diastolic 80 60 68  Pulse 68 62 76    BP 112/80   Pulse 68   Ht 5' 3 (1.6 m)   Wt 207 lb (93.9 kg)   BMI 36.67 kg/m   Wt Readings from Last 3 Encounters:  02/09/24 207 lb (93.9 kg)  01/18/24 214 lb 15.2 oz (97.5 kg)  10/24/23 215 lb (97.5 kg)    Physical Exam  Constitutional:  Body mass index is 36.67 kg/m.,  not in acute distress, normal state of mind Eyes: PERRLA, EOMI, no exophthalmos ENT: moist mucous membranes, no gross thyromegaly, no gross cervical lymphadenopathy Cardiovascular: normal precordial activity, Regular Rate and Rhythm, no Murmur/Rubs/Gallops Respiratory:  adequate breathing efforts, no gross chest deformity, Clear to auscultation bilaterally Gastrointestinal: abdomen soft, Non -tender, No distension, Bowel Sounds present, no gross organomegaly Musculoskeletal: no gross deformities, +  large bilateral lower extremities with mild edema, strength intact in all four extremities. Skin: moist, warm, no rashes Neurological: no  tremor with outstretched hands, Deep tendon reflexes normal in bilateral lower extremities.  CMP ( most recent) CMP     Component Value Date/Time   NA 139 09/07/2023 1208   K 4.7 09/07/2023 1208   CL 99 09/07/2023 1208   CO2 22 09/07/2023 1208   GLUCOSE 105 (H) 09/07/2023 1208   GLUCOSE 125 (H) 05/30/2023 1357   BUN 15 09/07/2023 1208   CREATININE 0.97 09/07/2023 1208   CREATININE 0.68 11/13/2015 1047   CALCIUM  9.5 09/07/2023 1208   PROT 6.6 09/07/2023 1208   ALBUMIN  4.2 09/07/2023 1208   AST 13 09/07/2023 1208   ALT 20 09/07/2023 1208   ALKPHOS 90 09/07/2023 1208   BILITOT 0.5 09/07/2023 1208   EGFR 62 09/07/2023 1208   GFRNONAA 52 (L) 05/30/2023 1357     Diabetic Labs (most recent): Lab Results  Component Value Date   HGBA1C 5.3 03/07/2023   HGBA1C 6.0 (H) 08/25/2022   HGBA1C 5.5 03/11/2016     Lipid Panel ( most recent) Lipid Panel     Component Value Date/Time   CHOL 167 09/07/2023 1206   TRIG 87 09/07/2023 1206   HDL 98 09/07/2023 1206   CHOLHDL 1.7 09/07/2023 1206   CHOLHDL 2.3 03/12/2016 0252   VLDL 30 03/12/2016 0252   LDLCALC 53 09/07/2023 1206   LABVLDL 16 09/07/2023 1206      Lab Results  Component Value Date   TSH 1.916 03/06/2023   TSH 1.234 10/26/2020   TSH 2.398 03/11/2016      Assessment & Plan:   1. Adrenal insufficiency  2. Prediabetes 3. Mixed hyperlipidemia 4. Vitamin D  deficiency5. Osteoporosis, unspecified osteoporosis type, unspecified pathological fracture presence  - ALVERA TOURIGNY  is being seen at a kind request of Bertell Satterfield, MD. - I have reviewed her available  records and clinically evaluated the patient. 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   It may not be feasible to withdraw steroid treatment to do repeat  testing.  This patient will benefit from replacement dose steroids only to minimize the metabolic complications of excessive steroids.  Accordingly, I advised her to discontinue prednisone .  I discussed and prescribed hydrocortisone  15 mg p.o. daily at 8 AM and 10 mg p.o. daily at noon. Steroids sick day rule  and medical alert were discussed with her.  The need for lifelong steroid supplement was discussed with her.  At this time she may not need mineralocorticoid replacement as she is still being treated for hypertension with multiple medications. -Complications of excessive steroid treatment were discussed with the family.  She already has prediabetes, osteoporosis, obesity, prediabetes, hypertension, hyperlipidemia with cardiovascular complications. Her most recent lipid panel showed LDL of 53, considered good control along with regular cholesterol 167.  She is advised to continue her atorvastatin  40 mg p.o. nightly.    She will benefit the most from weight loss, she is encouraged to stay on semaglutide , statins, and her current blood pressure medications. I will request for a copy of her most recent bone density from her orthopedist.  This patient is not an automatic candidate for osteoporosis treatment.  Due to her history of bariatric surgery and possible GERD, she is not a candidate for oral bisphosphonates. She  will be considered for IV Reclast  or Prolia. She has exceedingly high risk for cardiovascular disease not a candidate for romosozumab.  Considering her metabolic burden, she will be offered lifestyle medicine package on her subsequent visit. She will undergo the following labs before next visit: - Comprehensive metabolic panel with GFR - TSH - T4, free - Lipid panel - VITAMIN D  25 Hydroxy (Vit-D Deficiency, Fractures)   -She will be considered for repeat bone density with VFA 2 years after her last bone density. - she is advised to maintain close follow up with Bertell Satterfield,  MD for primary care needs.   -Thank you for involving me in the care of this pleasant patient.  Time spent with the patient: 60  minutes spent in  counseling her about adrenal insufficiency, osteoporosis, prediabetes, and the rest in obtaining information about her symptoms, reviewing her previous labs/studies (including abstractions from other facilities),  evaluations, and treatments,  and developing a plan to confirm diagnosis and long term treatment based on the latest standards of care/guidelines; and documenting her care.  Cindy Holmes participated in the discussions, expressed understanding, and voiced agreement with the above plans.  All questions were answered to her satisfaction. she is encouraged to contact clinic should she have any questions or concerns prior to her return visit.  Follow up plan: Return in 3 months (on 05/11/2024), or Get a copy of her DXA from Dr. [facility], for Fasting Labs  in AM B4 8, A1c -[place].   Ranny Earl, MD [facility] [address] [place], [place] [address] Phone: [phone]  Fax: [phone]     02/09/2024, 6:42 PM  This note was partially dictated with voice recognition software. Similar sounding words can be transcribed inadequately or may not  be corrected upon review.

## 2024-02-09 NOTE — Telephone Encounter (Signed)
 Faxed signed medical release form by pt to [facility] for DEXA

## 2024-02-10 ENCOUNTER — Other Ambulatory Visit (HOSPITAL_COMMUNITY): Payer: Self-pay

## 2024-02-10 MED ORDER — WEGOVY 1 MG/0.5ML ~~LOC~~ SOAJ
1.0000 mg | SUBCUTANEOUS | 5 refills | Status: DC
  Filled 2024-02-10: qty 2, 28d supply, fill #0

## 2024-02-10 NOTE — Addendum Note (Signed)
 Addended by: Tamla Winkels L on: 02/10/2024 08:49 AM   Modules accepted: Orders

## 2024-02-13 ENCOUNTER — Other Ambulatory Visit: Payer: Self-pay

## 2024-02-13 NOTE — Addendum Note (Signed)
 Addended by: CLAUDENE ZADA POUR on: 02/13/2024 10:18 AM   Modules accepted: Orders

## 2024-02-16 ENCOUNTER — Telehealth: Payer: Self-pay | Admitting: "Endocrinology

## 2024-02-16 ENCOUNTER — Telehealth: Payer: Self-pay

## 2024-02-16 NOTE — Telephone Encounter (Signed)
 Pt called stating she was seen in the office last week and was prescribed hydrocortisone  and was advised to discontinue prednisone . States the hydrocortisone  is not working well with her fluid pill and my kidneys are not acting right. States she is experiencing increased edema.

## 2024-02-16 NOTE — Telephone Encounter (Signed)
 error

## 2024-02-17 NOTE — Telephone Encounter (Signed)
 Spoke with pt advising her that neither prednisone  or the hydrocortisone  are prescribed to treat edema, they are used to treat her adrenal insufficiency per Dr.Nida. Advised pt to increase her Torsemide  to 40mg  each morning, continue taking the hyrdrocortisone and stay off of the prednisone , to be sure she is elevating her legs while sitting or lying down and to wear compression socks per Dr.Nida's orders. Pt voiced understanding.

## 2024-03-06 ENCOUNTER — Ambulatory Visit: Payer: Self-pay

## 2024-03-06 VITALS — BP 105/69 | HR 74 | Ht 63.0 in | Wt 210.0 lb

## 2024-03-06 DIAGNOSIS — Z79899 Other long term (current) drug therapy: Secondary | ICD-10-CM | POA: Insufficient documentation

## 2024-03-06 DIAGNOSIS — R768 Other specified abnormal immunological findings in serum: Secondary | ICD-10-CM | POA: Insufficient documentation

## 2024-03-06 DIAGNOSIS — G4700 Insomnia, unspecified: Secondary | ICD-10-CM | POA: Insufficient documentation

## 2024-03-06 DIAGNOSIS — I5032 Chronic diastolic (congestive) heart failure: Secondary | ICD-10-CM | POA: Diagnosis not present

## 2024-03-06 DIAGNOSIS — Z933 Colostomy status: Secondary | ICD-10-CM

## 2024-03-06 NOTE — Progress Notes (Signed)
 New Patient Office Visit  Subjective    Patient ID: Cindy Holmes, female    DOB: 04/25/1951  Age: 73 y.o. MRN: [id number]  CC:  Chief Complaint  Patient presents with   Establish Care    Pt here to establish care     HPI Cindy Holmes presents to establish care HPI Cindy Holmes is 73 y.o. female who presents today with a medical history as above. she is being seen in consultation for adrenal insufficiency requested by Bertell Satterfield, MD.  Patient is accompanied by her husband to clinic.  History is obtained from the family as well as chart review.  Patient with long and complicated medical history including use of steroids high-dose for various rheumatology conditions including rheumatoid arthritis, osteoarthritis, neuromuscular disorders. High exposure to steroids used for decades at a minimum. Over the years, she has gained significant amount of weight associated with deconditioning and large joint arthritis which led her to be wheelchair-bound.   She also has coronary artery disease, CVA, hypertension, hyperlipidemia, obesity, prediabetes. Due to disequilibrium, she underwent lab work in June 2025 which revealed hypocortisolemia that was ANA and PM times. On 08/24/2023 her ACTH  was 6.3, p.m. cortisol was 1.7, a.m. cortisol was 2.9. This led to initiation of hydrocortisone  10 mg p.o. daily on top of her existing prednisone  5 mg p.o. daily.  She previously took prednisone  up to 20 mg p.o. daily on various cycles.   She does not have acute complaints today. She was diagnosed with osteoporosis after documenting vertebral fractures per her history, however she is not on osteoporosis treatment at this time.  She had history of fracture in her ankle, knee, wrist in addition to vertebral fractures. Her medical history includes extensive surgical history including cardiac catheterization, coronary angioplasty, laparoscopic adjustable gastric banding, open reduction and internal  fixation of ankle fractures, shoulder arthroscopy with subacromial decompression, cholecystectomy.   She denies nausea, vomiting, diarrhea.  She is sedentary at baseline.  She does not follow any particular diet program at this time. Her medications include atorvastatin , ezetimibe , semaglutide , torsemide , metoprolol , methotrexate , hydroxychloroquine , hydrocortisone  10 mg p.o. daily, prednisone  5 mg daily.  Outpatient Encounter Medications as of 03/06/2024  Medication Sig   allopurinol  (ZYLOPRIM ) 100 MG tablet Take 1 tablet (100 mg total) by mouth daily. Resume this at 100 mg daily follow with your PCP to titrate this back up as needed.   apixaban  (ELIQUIS ) 5 MG TABS tablet Take 1 tablet (5 mg total) by mouth 2 (two) times daily.   aspirin  EC 81 MG tablet Take 81 mg by mouth at bedtime. Swallow whole.   atorvastatin  (LIPITOR ) 40 MG tablet Take 2 tablets (80 mg total) by mouth daily   B Complex Vitamins (B COMPLEX VITAMIN PO) Take 1,000 mcg by mouth daily.   Cholecalciferol (VITAMIN D -3 PO) Take 50 mcg by mouth daily.   clobetasol cream (TEMOVATE) 0.05 % Apply 1 Application topically 2 (two) times daily.   Coenzyme Q10 (COQ10) 100 MG CAPS Take 1 capsule by mouth daily.   esomeprazole  (NEXIUM ) 20 MG capsule Take 20 mg by mouth daily at 12 noon.   ezetimibe  (ZETIA ) 10 MG tablet Take 1 tablet (10 mg total) by mouth daily.   folic acid  (FOLVITE ) 1 MG tablet Take 1 mg by mouth at bedtime.   furosemide  (LASIX ) 40 MG tablet Take 1 tablet (40 mg total) by mouth 2 (two) times daily.   hydrocortisone  (CORTEF ) 10 MG tablet Take 15 mg (1.5 tablet) daily  at 8 AM and 10 mg (1 tablet) daily at noon   hydroxychloroquine  (PLAQUENIL ) 200 MG tablet Take 200 mg by mouth 2 (two) times daily.   magnesium  oxide (MAG-OX) 400 MG tablet Take 400 mg by mouth daily.   methotrexate  (RHEUMATREX) 2.5 MG tablet Take 10 mg by mouth every Sunday.   metoprolol  tartrate (LOPRESSOR ) 25 MG tablet Take 1 tablet (25 mg total) by mouth  every morning. & 50 mg in the evening   metoprolol  tartrate (LOPRESSOR ) 50 MG tablet Take 50 mg by mouth daily.   Omega 3-6-9 Fatty Acids (OMEGA 3-6-9 PO) Take 1 capsule by mouth daily.   Omega-3 Fatty Acids (FISH OIL) 1200 MG CAPS Take 1 capsule by mouth daily.   potassium chloride  SA (KLOR-CON  M) 20 MEQ tablet Take 1 tablet (20 mEq total) by mouth 2 (two) times daily.   Semaglutide -Weight Management (WEGOVY ) 1 MG/0.5ML SOAJ Inject 1 mg into the skin once a week. WHEN YOU FINISH THE 0.5 MG   spironolactone  (ALDACTONE ) 25 MG tablet TAKE 1 TABLET BY MOUTH DAILY   torsemide  (DEMADEX ) 20 MG tablet Take 20 mg by mouth 2 (two) times daily.   zolpidem  (AMBIEN ) 5 MG tablet Take 5 mg by mouth at bedtime.   [DISCONTINUED] acetaminophen  (TYLENOL ) 500 MG tablet Take 1,000 mg by mouth every 6 (six) hours as needed for headache (pain). (Patient not taking: Reported on 02/09/2024)   [DISCONTINUED] ferrous sulfate  325 (65 FE) MG tablet Take 1 tablet (325 mg total) by mouth every other day. Follow with your PCP to follow your iron deficiency outpatient. (Patient not taking: Reported on 02/09/2024)   No facility-administered encounter medications on file as of 03/06/2024.    Past Medical History:  Diagnosis Date   CAD (coronary artery disease)    a. PTCA LAD 2001/cath 2002-prox and mid LAD stents patent, PTCA ostium Dx 1 b. cath 03/2016: patent LAD stent with less than 40% in-stent restenosis, 10-30% stenosis of D1 and distal LAD with continued medical management recommended. 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    Past Surgical History:  Procedure Laterality Date   BIOPSY  03/04/2023   Procedure: BIOPSY;  Surgeon: Rollin Dover, MD;  Location: [facility] ENDOSCOPY;  Service: Gastroenterology;;   BOWEL RESECTION  03/08/2023   Procedure: SMALL BOWEL RESECTION;  Surgeon: Vanderbilt Ned, MD;  Location: [facility] OR;  Service: General;;   CARDIAC CATHETERIZATION     with stent placement in 2001   CARDIAC CATHETERIZATION N/A 03/12/2016   Procedure: Left Heart Cath and Coronary Angiography;  Surgeon: Lonni Hanson, MD;  Location: [facility] INVASIVE CV LAB;  Service: Cardiovascular;  Laterality: N/A;   CARDIAC CATHETERIZATION N/A 03/12/2016   Procedure: Intravascular Pressure Wire/FFR Study;  Surgeon: Lonni Hanson, MD;  Location: [facility] INVASIVE CV LAB;  Service: Cardiovascular;  Laterality: N/A;   CATARACT EXTRACTION W/PHACO Right 01/24/2024   Procedure: PHACOEMULSIFICATION,  CATARACT, WITH IOL INSERTION;  Surgeon: Juli Blunt, MD;  Location: [facility] ORS;  Service: Ophthalmology;  Laterality: Right;  CDE: 5.63   CHOLECYSTECTOMY     COLECTOMY WITH COLOSTOMY CREATION/HARTMANN PROCEDURE N/A 03/08/2023   Procedure: COLECTOMY WITH COLOSTOMY CREATION/HARTMANN PROCEDURE;  Surgeon: Vanderbilt Ned, MD;  Location: [facility] OR;  Service: General;  Laterality: N/A;   COLONOSCOPY WITH PROPOFOL  N/A 03/04/2023   Procedure: COLONOSCOPY WITH PROPOFOL ;  Surgeon: Rollin Dover, MD;  Location: [facility] ENDOSCOPY;  Service: Gastroenterology;  Laterality: N/A;   CORONARY ANGIOPLASTY     2002   ESOPHAGOGASTRODUODENOSCOPY N/A 12/03/2016   Procedure: ESOPHAGOGASTRODUODENOSCOPY (EGD);  Surgeon: Rollin Dover, MD;  Location: [facility] ENDOSCOPY;  Service: Endoscopy;  Laterality: N/A;   INSERTION, STENT, DRUG-ELUTING, LACRIMAL CANALICULUS Right 01/24/2024   Procedure: INSERTION, STENT, DRUG-ELUTING, LACRIMAL CANALICULUS;  Surgeon: Juli Blunt, MD;   Location: [facility] ORS;  Service: Ophthalmology;  Laterality: Right;   LAPAROTOMY N/A 03/08/2023   Procedure: EXPLORATION LAPAROTOMY;  Surgeon: Vanderbilt Ned, MD;  Location: [facility] OR;  Service: General;  Laterality: N/A;   laproscopic adjustable gastric  banding     with APS standard system.    OPEN REDUCTION INTERNAL FIXATION (ORIF) DISTAL RADIAL FRACTURE Right 05/15/2018   Procedure: OPEN REDUCTION INTERNAL FIXATION (ORIF) DISTAL RADIAL FRACTURE;  Surgeon: Kendal Franky SQUIBB, MD;  Location: [facility] OR;  Service: Orthopedics;  Laterality: Right;   ORIF ANKLE FRACTURE Right 05/15/2018   Procedure: OPEN REDUCTION INTERNAL FIXATION (ORIF) ANKLE FRACTURE;  Surgeon: Kendal Franky SQUIBB, MD;  Location: [facility] OR;  Service: Orthopedics;  Laterality: Right;   ORIF TIBIA PLATEAU Right 05/15/2018   Procedure: OPEN REDUCTION INTERNAL FIXATION (ORIF) TIBIAL PLATEAU;  Surgeon: Kendal Franky SQUIBB, MD;  Location: [facility] OR;  Service: Orthopedics;  Laterality: Right;   Pt has 3 stents     sept 2001   SHOULDER ARTHROSCOPY WITH SUBACROMIAL DECOMPRESSION, ROTATOR CUFF REPAIR AND BICEP TENDON REPAIR Right 11/16/2013   Procedure: RIGHT SHOULDER ARTHROSCOPY, EXTENSIVE DEBRIDEMENT, ROTATOR CUFF REPAIR ;  Surgeon: Fonda SQUIBB Olmsted, MD;  Location: [facility];  Service: Orthopedics;  Laterality: Right;   TUBAL LIGATION      Family History  Problem Relation Age of Onset   Heart attack Mother    Heart disease Mother    Hyperlipidemia Mother    Arthritis/Rheumatoid Father    Cirrhosis Father        hepatic cirrhosis   Osteoporosis Father    Breast cancer Sister 39   Diabetes Sister    Diabetes Brother    Heart disease Brother    Heart disease Brother    Heart disease Maternal Aunt    Heart attack Maternal Aunt    Throat cancer Maternal Aunt    Heart attack Maternal Uncle    Colon cancer Maternal Grandmother    Throat cancer Maternal Grandfather    Cancer Other        family history   Heart attack Other        family history     Social History   Socioeconomic History   Marital status: Married    Spouse name: Not on file   Number of children: Not on file   Years of education: Not on file   Highest education level: Not on file  Occupational History   Not on file  Tobacco Use   Smoking status: Never   Smokeless tobacco: Never  Vaping Use   Vaping status: Never Used  Substance and Sexual Activity   Alcohol  use: No  Alcohol /week: 0.0 standard drinks of alcohol    Drug use: No   Sexual activity: Never  Other Topics Concern   Not on file  Social History Narrative   Not on file   Social Drivers of Health   Financial Resource Strain: Low Risk  (05/26/2023)   Overall Financial Resource Strain (CARDIA)    Difficulty of Paying Living Expenses: Not very hard  Food Insecurity: No Food Insecurity (04/21/2023)   Hunger Vital Sign    Worried About Running Out of Food in the Last Year: Never true    Ran Out of Food in the Last Year: Never true  Transportation Needs: No Transportation Needs (05/26/2023)   PRAPARE - Administrator, Civil Service (Medical): No    Lack of Transportation (Non-Medical): No  Physical Activity: Inactive (04/25/2023)   Exercise Vital Sign    Days of Exercise per Week: 0 days    Minutes of Exercise per Session: 0 min  Stress: Not on file  Social Connections: Not on file  Intimate Partner Violence: Not At Risk (03/30/2023)   Humiliation, Afraid, Rape, and Kick questionnaire    Fear of Current or Ex-Partner: No    Emotionally Abused: No    Physically Abused: No    Sexually Abused: No   ROS     Objective    BP 105/69   Pulse 74   Ht 5' 3 (1.6 m)   Wt 210 lb (95.3 kg)   SpO2 94%   BMI 37.20 kg/m   Physical Exam Vitals and nursing note reviewed. Exam conducted with a chaperone present (husband is with her).  Constitutional:      Appearance: She is obese.  HENT:     Head: Normocephalic.     Right Ear: Tympanic membrane, ear canal and external ear  normal.     Left Ear: Tympanic membrane, ear canal and external ear normal.     Nose: Nose normal.     Mouth/Throat:     Mouth: Mucous membranes are moist.     Pharynx: Oropharynx is clear.  Cardiovascular:     Rate and Rhythm: Normal rate and regular rhythm.  Pulmonary:     Effort: Pulmonary effort is normal.     Breath sounds: Normal breath sounds.  Abdominal:     General: The ostomy site is clean. Bowel sounds are decreased.     Tenderness: There is no abdominal tenderness.  Musculoskeletal:     Cervical back: Normal range of motion and neck supple.     Right lower leg: Edema present.     Left lower leg: Edema present.  Skin:    General: Skin is warm and dry.  Neurological:     Mental Status: She is alert and oriented to person, place, and time.     Gait: Gait abnormal (ambulates with a power scooter).  Psychiatric:        Mood and Affect: Mood normal.        Thought Content: Thought content normal.         Assessment & Plan:   Problem List Items Addressed This Visit   None Visit Diagnoses       Colostomy status (HCC)    -  Primary   Relevant Orders   Amb Referral to Ostomy Clinic      Assessment and Plan    Colostomy with peristomal skin irritation Peristomal skin irritation with burning and rash due to frequent changes and adhesive issues. Attempts to manage with creams  and cleaning have been insufficient. - Submit referral to ostomy clinic for evaluation and management. - Increase supply of colostomy bags to two changes per day. - Increase supply of donut seals to match colostomy bags. - Order cream for peristomal skin irritation.  Congestive heart failure Recent exacerbation due to fluid overload managed with diuretics and prednisone . Self-adjusted medications post-discharge.  Atrial fibrillation Episodes of increased heart rate managed with medication adjustments.  Coronary artery disease with history of stents and angioplasty Managed with Eliquis   for anticoagulation. Discontinued Plavix  and ramipril  due to hypotension.  Rheumatoid arthritis and gout Severe rheumatoid arthritis with gout managed with steroids and medications. Significant joint pain persists.  Systemic lupus erythematosus  Osteoarthritis with history of multiple fractures and joint replacements History of fractures and joint replacements with ongoing pain and mobility issues.  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 Recent fluid retention when steroids were discontinued.  History of diverticulitis with partial colectomy Partial colectomy due to severe diverticulitis. Ongoing gastrointestinal issues require careful colostomy management.  History of stroke Two strokes. Requires ongoing monitoring of stroke risk factors.  Peripheral arterial disease Identified in neck and legs, contributing to cardiovascular risk.  Goals of Care Concerned about complex medical history and proactive in managing health. - Discussed importance of wearing a medical ID bracelet due to adrenal insufficiency. - Encouraged ongoing communication with healthcare providers to manage complex medical needs.       Return in about 8 weeks (around 04/30/2024) for chronic follow-up with PCP.   Leita Longs, FNP

## 2024-03-11 DIAGNOSIS — Z933 Colostomy status: Secondary | ICD-10-CM | POA: Insufficient documentation

## 2024-03-11 DIAGNOSIS — E669 Obesity, unspecified: Secondary | ICD-10-CM | POA: Insufficient documentation

## 2024-03-11 DIAGNOSIS — Z79899 Other long term (current) drug therapy: Secondary | ICD-10-CM | POA: Insufficient documentation

## 2024-03-13 ENCOUNTER — Other Ambulatory Visit: Payer: Self-pay

## 2024-03-13 DIAGNOSIS — Z933 Colostomy status: Secondary | ICD-10-CM

## 2024-03-13 MED ORDER — PREMIER CERAPLUS 2 1/8" POUCH MISC: 1.0000 | Freq: Two times a day (BID) | 5 refills | Status: AC

## 2024-03-13 MED ORDER — ADAPT CERARING MISC: 1.0000 | Freq: Two times a day (BID) | 5 refills | Status: AC

## 2024-03-13 MED ORDER — ADAPTER CAP MISC: 1.0000 | Freq: Two times a day (BID) | 3 refills | Status: AC

## 2024-03-13 NOTE — Progress Notes (Signed)
 Cindy Holmes

## 2024-03-14 DIAGNOSIS — H2512 Age-related nuclear cataract, left eye: Secondary | ICD-10-CM | POA: Diagnosis not present

## 2024-03-14 NOTE — H&P (Signed)
 Surgical History & Physical  Patient Name: Cindy Holmes  DOB: [birth year]  Surgery: Cataract extraction with intraocular lens implant phacoemulsification; Left Eye Surgeon: Marsa Cleverly MD Surgery Date: 03/23/2024 Pre-Op Date: 02/01/2024  HPI: A 78 Yr. old female patient here today for a 1 week post operative exam s/p CE IOL OD (DOS:01/24/24) and a pre-operative CE IOL exam OS. Pt states vision is blurry a lot in OD, unsure if it is her OS pulling her OD. Pt reports vision is not clear and feels like something is wrong. Pt has been compliant using Imprimis drops TID OD. Pt complains of hazy, blurry vision OS including difficulty reading small print on books, bottles, and labels, difficulty seeing captions on the television, and difficulty recognizing people at a distance. with OS Pt complains of difficulty with glare on bright sunny days, along with poor night vision OS. HPI was performed by Marsa Cleverly .  Medical History: Cataracts  Diabetes Heart Problem High Blood Pressure LDL Stroke  Review of Systems Cardiovascular High Blood Pressure, heart problems All recorded systems are negative except as noted above.  Social Unknown if ever smoked   Medication Prednisolone-moxiflox-bromfen,  Methotrexate  sodium, Metolazone, Atorvastatin , Valacyclovir , Clobetasol, Torsemide , Folic acid , Hydroxychloroquine , Prednisone , Zolpidem , Potassium chloride , Spironolactone , Metoprolol  tartrate, Hydrocortisone , Eliquis , Ezetimibe , Wegovy   Sx/Procedures Phaco c IOL OD-dextenza ,  Colonoscopy   Drug Allergies  Codeine sulfate, Pencillins, Penicillin  History & Physical: Heent: cataract NECK: supple without bruits LUNGS: lungs clear to auscultation CV: regular rate and rhythm Abdomen: soft and non-tender  Impression & Plan: Assessment: 1.  CATARACT AGE-RELATED COMBINED FORMS; Both Eyes (H25.813) 2.  CATARACT EXTRACTION STATUS; Right Eye (Z98.41) 3.  INTRAOCULAR LENS IOL  (Z96.1)  Plan: 1.  Cataracts are visually significant and account for the patient's complaints. Discussed all risks, benefits, procedures and recovery, including infection, loss of vision and eye, need for glasses after surgery or additional procedures. Patient understands changing glasses will not improve vision. Patient indicated understanding of procedure. All questions answered. Patient desires to have surgery, recommend phacoemulsification with intraocular lens. Patient to have preliminary testing necessary ([employer]/IOL Master, Mac OCT, TOPO) Educational materials provided:Cataract.  Plan: - Proceed with cataract surgery OS when ready - Plan for -2.00 target with DIB00 lens - No DM, no fuchs, no prior eye surgery - good dilation - Dextenza  if available  2.  POW1. Doing well. All post-op precautions discussed and instructions reviewed. Written instructions given  3.  See above

## 2024-03-16 ENCOUNTER — Encounter (HOSPITAL_COMMUNITY)
Admission: RE | Admit: 2024-03-16 | Discharge: 2024-03-16 | Disposition: A | Discharge status: Home / Self Care | Source: Ambulatory Visit | Attending: Optometry | Admitting: Optometry

## 2024-03-16 ENCOUNTER — Encounter (HOSPITAL_COMMUNITY): Payer: Self-pay

## 2024-03-23 ENCOUNTER — Ambulatory Visit (HOSPITAL_COMMUNITY)
Admission: RE | Admit: 2024-03-23 | Discharge: 2024-03-23 | Disposition: A | Discharge status: Home / Self Care | Attending: Optometry | Admitting: Optometry

## 2024-03-23 ENCOUNTER — Encounter (HOSPITAL_COMMUNITY)
Admission: RE | Disposition: A | Discharge status: Home / Self Care | Payer: Self-pay | Source: Home / Self Care | Attending: Optometry

## 2024-03-23 ENCOUNTER — Other Ambulatory Visit: Payer: Self-pay

## 2024-03-23 ENCOUNTER — Encounter (HOSPITAL_COMMUNITY): Payer: Self-pay | Admitting: Optometry

## 2024-03-23 ENCOUNTER — Ambulatory Visit (HOSPITAL_COMMUNITY): Payer: Self-pay | Admitting: Anesthesiology

## 2024-03-23 DIAGNOSIS — Z8673 Personal history of transient ischemic attack (TIA), and cerebral infarction without residual deficits: Secondary | ICD-10-CM | POA: Diagnosis not present

## 2024-03-23 DIAGNOSIS — H2512 Age-related nuclear cataract, left eye: Secondary | ICD-10-CM | POA: Diagnosis not present

## 2024-03-23 DIAGNOSIS — I2510 Atherosclerotic heart disease of native coronary artery without angina pectoris: Secondary | ICD-10-CM | POA: Insufficient documentation

## 2024-03-23 DIAGNOSIS — I5033 Acute on chronic diastolic (congestive) heart failure: Secondary | ICD-10-CM

## 2024-03-23 DIAGNOSIS — Z961 Presence of intraocular lens: Secondary | ICD-10-CM | POA: Diagnosis not present

## 2024-03-23 DIAGNOSIS — I509 Heart failure, unspecified: Secondary | ICD-10-CM | POA: Insufficient documentation

## 2024-03-23 DIAGNOSIS — I110 Hypertensive heart disease with heart failure: Secondary | ICD-10-CM | POA: Diagnosis not present

## 2024-03-23 DIAGNOSIS — Z9841 Cataract extraction status, right eye: Secondary | ICD-10-CM | POA: Diagnosis not present

## 2024-03-23 DIAGNOSIS — I4891 Unspecified atrial fibrillation: Secondary | ICD-10-CM | POA: Diagnosis not present

## 2024-03-23 DIAGNOSIS — H5712 Ocular pain, left eye: Secondary | ICD-10-CM | POA: Diagnosis not present

## 2024-03-23 DIAGNOSIS — N289 Disorder of kidney and ureter, unspecified: Secondary | ICD-10-CM | POA: Diagnosis not present

## 2024-03-23 DIAGNOSIS — G709 Myoneural disorder, unspecified: Secondary | ICD-10-CM | POA: Diagnosis not present

## 2024-03-23 DIAGNOSIS — E1136 Type 2 diabetes mellitus with diabetic cataract: Secondary | ICD-10-CM | POA: Diagnosis not present

## 2024-03-23 DIAGNOSIS — M1990 Unspecified osteoarthritis, unspecified site: Secondary | ICD-10-CM | POA: Insufficient documentation

## 2024-03-23 DIAGNOSIS — Z7901 Long term (current) use of anticoagulants: Secondary | ICD-10-CM | POA: Insufficient documentation

## 2024-03-23 HISTORY — PX: INSERTION, STENT, DRUG-ELUTING, LACRIMAL CANALICULUS: SHX7453

## 2024-03-23 HISTORY — PX: CATARACT EXTRACTION W/PHACO: SHX586

## 2024-03-23 SURGERY — PHACOEMULSIFICATION, CATARACT, WITH IOL INSERTION
Anesthesia: Monitor Anesthesia Care | Site: Eye | Laterality: Left

## 2024-03-23 MED ORDER — MIDAZOLAM HCL 2 MG/2ML IJ SOLN
INTRAMUSCULAR | Status: DC | PRN
  Administered 2024-03-23 (×2): 1 mg via INTRAVENOUS

## 2024-03-23 MED ORDER — LIDOCAINE HCL (PF) 1 % IJ SOLN
INTRAMUSCULAR | Status: DC | PRN
  Administered 2024-03-23: 1 mL

## 2024-03-23 MED ORDER — TROPICAMIDE 1 % OP SOLN
1.0000 [drp] | OPHTHALMIC | Status: AC | PRN
  Administered 2024-03-23 (×3): 1 [drp] via OPHTHALMIC

## 2024-03-23 MED ORDER — SIGHTPATH DOSE#1 NA HYALUR & NA CHOND-NA HYALUR IO KIT
PACK | INTRAOCULAR | Status: DC | PRN
  Administered 2024-03-23: 1 via OPHTHALMIC

## 2024-03-23 MED ORDER — MIDAZOLAM HCL 2 MG/2ML IJ SOLN
INTRAMUSCULAR | Status: AC
  Filled 2024-03-23: qty 2

## 2024-03-23 MED ORDER — DEXAMETHASONE 0.4 MG OP INST
VAGINAL_INSERT | OPHTHALMIC | Status: DC | PRN
  Administered 2024-03-23: .4 mg via OPHTHALMIC

## 2024-03-23 MED ORDER — MOXIFLOXACIN HCL 5 MG/ML IO SOLN
INTRAOCULAR | Status: DC | PRN
  Administered 2024-03-23: .3 mL via INTRACAMERAL

## 2024-03-23 MED ORDER — STERILE WATER FOR IRRIGATION IR SOLN
Status: DC | PRN
  Administered 2024-03-23: 10 mL

## 2024-03-23 MED ORDER — PHENYLEPHRINE-KETOROLAC 1-0.3 % IO SOLN
INTRAOCULAR | Status: DC | PRN
  Administered 2024-03-23: 500 mL via OPHTHALMIC

## 2024-03-23 MED ORDER — TETRACAINE 0.5 % OP SOLN OPTIME - NO CHARGE
OPHTHALMIC | Status: DC | PRN
  Administered 2024-03-23: 2 [drp] via OPHTHALMIC

## 2024-03-23 MED ORDER — DEXAMETHASONE 0.4 MG OP INST
VAGINAL_INSERT | OPHTHALMIC | Status: AC
  Filled 2024-03-23: qty 1

## 2024-03-23 MED ORDER — SODIUM CHLORIDE 0.9% FLUSH
INTRAVENOUS | Status: DC | PRN
  Administered 2024-03-23 (×2): 3 mL via INTRAVENOUS

## 2024-03-23 MED ORDER — LACTATED RINGERS IV SOLN: INTRAVENOUS | Status: DC

## 2024-03-23 MED ORDER — POVIDONE-IODINE 5 % OP SOLN
OPHTHALMIC | Status: DC | PRN
  Administered 2024-03-23: 1 via OPHTHALMIC

## 2024-03-23 MED ORDER — TETRACAINE HCL 0.5 % OP SOLN
1.0000 [drp] | OPHTHALMIC | Status: AC | PRN
  Administered 2024-03-23 (×3): 1 [drp] via OPHTHALMIC

## 2024-03-23 MED ORDER — PHENYLEPHRINE HCL 2.5 % OP SOLN
1.0000 [drp] | OPHTHALMIC | Status: AC | PRN
  Administered 2024-03-23 (×3): 1 [drp] via OPHTHALMIC

## 2024-03-23 MED ORDER — BSS IO SOLN
INTRAOCULAR | Status: DC | PRN
  Administered 2024-03-23: 15 mL via INTRAOCULAR

## 2024-03-23 MED ORDER — LIDOCAINE HCL 3.5 % OP GEL
1.0000 | Freq: Once | OPHTHALMIC | Status: AC
  Administered 2024-03-23: 1 via OPHTHALMIC

## 2024-03-23 SURGICAL SUPPLY — 13 items
CLOTH BEACON ORANGE TIMEOUT ST (SAFETY) ×1 IMPLANT
DRSG TEGADERM 4X4.75 (GAUZE/BANDAGES/DRESSINGS) ×1 IMPLANT
EYE SHIELD UNIVERSAL CLEAR (GAUZE/BANDAGES/DRESSINGS) IMPLANT
FEE CATARACT SUITE SIGHTPATH (MISCELLANEOUS) ×1 IMPLANT
GLOVE BIOGEL PI IND STRL 6.5 (GLOVE) IMPLANT
GLOVE BIOGEL PI IND STRL 7.0 (GLOVE) ×2 IMPLANT
LENS IOL TECNIS EYHANCE 22.5 (Intraocular Lens) IMPLANT
NDL HYPO 18GX1.5 BLUNT FILL (NEEDLE) ×1 IMPLANT
NEEDLE HYPO 18GX1.5 BLUNT FILL (NEEDLE) ×1 IMPLANT
PAD ARMBOARD POSITIONER FOAM (MISCELLANEOUS) ×1 IMPLANT
SYR TB 1ML LL NO SAFETY (SYRINGE) ×1 IMPLANT
TAPE SURG TRANSPORE 1 IN (GAUZE/BANDAGES/DRESSINGS) IMPLANT
WATER STERILE IRR 250ML POUR (IV SOLUTION) ×1 IMPLANT

## 2024-03-23 NOTE — Discharge Instructions (Signed)
 Please discharge patient when stable, will follow up today with Dr. Ilsa Iha at the [facility] office immediately following discharge.  Leave shield in place until visit.  All paperwork with discharge instructions will be given at the office.  [facility] Address:  [address]  [place], [place] [address]  Dr. Chaya Jan Phone: [phone]

## 2024-03-23 NOTE — Anesthesia Postprocedure Evaluation (Signed)
 Anesthesia Post Note  Patient: Cindy Holmes  Procedure(s) Performed: PHACOEMULSIFICATION, CATARACT, WITH IOL INSERTION (Left: Eye) INSERTION, STENT, DRUG-ELUTING, LACRIMAL CANALICULUS (Left: Eye)  Patient location during evaluation: PACU Anesthesia Type: MAC Level of consciousness: awake and alert Pain management: pain level controlled Vital Signs Assessment: post-procedure vital signs reviewed and stable Respiratory status: spontaneous breathing, nonlabored ventilation, respiratory function stable and patient connected to nasal cannula oxygen Cardiovascular status: stable and blood pressure returned to baseline Postop Assessment: no apparent nausea or vomiting Anesthetic complications: no   No notable events documented.   Last Vitals:  Vitals:   03/23/24 0920 03/23/24 1153  BP: 119/65 (!) 119/50  Pulse: 73 79  Resp: 15 20  Temp: 36.7 C 36.7 C  SpO2: 100% 98%    Last Pain:  Vitals:   03/23/24 1153  TempSrc: Oral  PainSc: 0-No pain                 Andrea Limes

## 2024-03-23 NOTE — Anesthesia Procedure Notes (Signed)
 Date/Time: 03/23/2024 11:30 AM  Performed by: Barbarann Verneita RAMAN, CRNAPre-anesthesia Checklist: Patient identified, Emergency Drugs available, Suction available, Timeout performed and Patient being monitored Patient Re-evaluated:Patient Re-evaluated prior to induction Oxygen Delivery Method: Nasal Cannula

## 2024-03-23 NOTE — Op Note (Signed)
 Date of procedure: 03/23/24  Pre-operative diagnosis: Visually significant age-related nuclear cataract, Left Eye (H25.12)  Post-operative diagnosis: Visually significant age-related nuclear cataract, Left Eye H25.12; Ocular Pain and Inflammation, Left eye H57.12  Procedure: Removal of cataract via phacoemulsification and insertion of intra-ocular lens J&J DIB00 +22.5D into the capsular bag of the Left Eye, Dextenza  Implantation into left lower punctum CPT [phone]  Attending surgeon: Marsa JINNY Cleverly, MD  Anesthesia: MAC, Topical Akten   Complications: None  Estimated Blood Loss: <15mL (minimal)  Specimens: None  Implants:  Implant Name Type Inv. Item Serial No. Manufacturer Lot No. LRB No. Used Action  LENS IOL TECNIS EYHANCE 22.5 - [id number] Intraocular Lens LENS IOL TECNIS EYHANCE 22.5 [id number] [employer]  Left 1 Implanted    Indications:  Visually significant age-related cataract, Left Eye  Procedure:  The patient was seen and identified in the pre-operative area. The operative eye was identified and dilated.  The operative eye was marked.  Topical anesthesia was administered to the operative eye.     The patient was then to the operative suite and placed in the supine position.  A timeout was performed confirming the patient, procedure to be performed, and all other relevant information.   The patient's face was prepped and draped in the usual fashion for intra-ocular surgery.  A lid speculum was placed into the operative eye and the surgical microscope moved into place and focused.  An inferotemporal paracentesis was created using a 20 gauge paracentesis blade.  BSS mixed with Omidria , followed by 1% lidocaine  was injected into the anterior chamber.  Viscoelastic was injected into the anterior chamber.  A temporal clear-corneal main wound incision was created using a 2.61mm microkeratome.  A continuous curvilinear capsulorrhexis was initiated using an irrigating cystitome and  completed using capsulorrhexis forceps.  Hydrodissection and hydrodeliniation were performed.  Viscoelastic was injected into the anterior chamber.  A phacoemulsification handpiece and a chopper as a second instrument were used to remove the nucleus and epinucleus. The irrigation/aspiration handpiece was used to remove any remaining cortical material.   The capsular bag was reinflated with viscoelastic, checked, and found to be intact.  The intraocular lens was inserted into the capsular bag.  The irrigation/aspiration handpiece was used to remove any remaining viscoelastic.  The clear corneal wound and paracentesis wounds were then hydrated and checked with Weck-Cels to be watertight. Moxifloxacin  was instilled into the anterior chamber.  The lid-speculum and drape were removed. The lower punctum was dilated, and the dextenza  implant was inserted into it. The patient's face was cleaned with a wet and dry 4x4.  A clear shield was taped over the eye. The patient was taken to the post-operative care unit in good condition, having tolerated the procedure well.  Post-Op Instructions: The patient will follow up at [facility] for a same day post-operative evaluation and will receive all other orders and instructions.

## 2024-03-23 NOTE — Anesthesia Preprocedure Evaluation (Addendum)
 Anesthesia Evaluation  Patient identified by MRN, date of birth, ID band Patient awake    Reviewed: Allergy & Precautions, H&P , NPO status , Patient's Chart, lab work & pertinent test results  History of Anesthesia Complications (+) PONV and history of anesthetic complications  Airway Mallampati: II  TM Distance: >3 FB Neck ROM: Full    Dental no notable dental hx.    Pulmonary neg pulmonary ROS   Pulmonary exam normal breath sounds clear to auscultation       Cardiovascular hypertension, + CAD and +CHF  + dysrhythmias Atrial Fibrillation  Rhythm:Irregular Rate:Normal     Neuro/Psych  PSYCHIATRIC DISORDERS       Neuromuscular disease CVA    GI/Hepatic negative GI ROS, Neg liver ROS,,,  Endo/Other  negative endocrine ROS    Renal/GU Renal disease  negative genitourinary   Musculoskeletal  (+) Arthritis ,    Abdominal   Peds negative pediatric ROS (+)  Hematology negative hematology ROS (+)   Anesthesia Other Findings   Reproductive/Obstetrics negative OB ROS                              Anesthesia Physical Anesthesia Plan  ASA: 3  Anesthesia Plan: MAC   Post-op Pain Management:    Induction:   PONV Risk Score and Plan:   Airway Management Planned: Nasal Cannula  Additional Equipment:   Intra-op Plan:   Post-operative Plan:   Informed Consent: I have reviewed the patients History and Physical, chart, labs and discussed the procedure including the risks, benefits and alternatives for the proposed anesthesia with the patient or authorized representative who has indicated his/her understanding and acceptance.     Dental advisory given  Plan Discussed with: CRNA  Anesthesia Plan Comments:          Anesthesia Quick Evaluation

## 2024-03-23 NOTE — Transfer of Care (Addendum)
 Immediate Anesthesia Transfer of Care Note  Patient: Cindy Holmes  Procedure(s) Performed: PHACOEMULSIFICATION, CATARACT, WITH IOL INSERTION (Left: Eye) INSERTION, STENT, DRUG-ELUTING, LACRIMAL CANALICULUS (Left: Eye)  Patient Location: Short Stay  Anesthesia Type:MAC  Level of Consciousness: awake and patient cooperative  Airway & Oxygen Therapy: Patient Spontanous Breathing  Post-op Assessment: Report given to RN and Post -op Vital signs reviewed and stable  Post vital signs: Reviewed and stable  Last Vitals:  Vitals Value Taken Time  BP 119/50 03/23/24   1158  Temp 98.1 03/23/24/  1158  Pulse 79 03/23/24   1158  Resp 20 03/23/24  1158  SpO2 98 03/23/24  1158    Last Pain:  Vitals:   03/23/24 0920  TempSrc: Oral  PainSc: 0-No pain      Patients Stated Pain Goal: 4 (03/23/24 0920)  Complications: No notable events documented.

## 2024-03-23 NOTE — Interval H&P Note (Signed)
 History and Physical Interval Note:  03/23/2024 10:54 AM  The H and P was reviewed and updated. The patient was examined.  No changes were found after exam.  The surgical eye was marked.  Cindy Holmes

## 2024-03-26 ENCOUNTER — Encounter (HOSPITAL_COMMUNITY): Payer: Self-pay | Admitting: Optometry

## 2024-04-03 ENCOUNTER — Other Ambulatory Visit: Payer: Self-pay

## 2024-04-03 DIAGNOSIS — M059 Rheumatoid arthritis with rheumatoid factor, unspecified: Secondary | ICD-10-CM

## 2024-04-06 DIAGNOSIS — L400 Psoriasis vulgaris: Secondary | ICD-10-CM | POA: Diagnosis not present

## 2024-04-06 DIAGNOSIS — L650 Telogen effluvium: Secondary | ICD-10-CM | POA: Diagnosis not present

## 2024-04-10 DIAGNOSIS — L309 Dermatitis, unspecified: Secondary | ICD-10-CM | POA: Diagnosis not present

## 2024-04-12 ENCOUNTER — Other Ambulatory Visit: Payer: Self-pay

## 2024-04-12 DIAGNOSIS — Z79899 Other long term (current) drug therapy: Secondary | ICD-10-CM

## 2024-04-12 DIAGNOSIS — D6869 Other thrombophilia: Secondary | ICD-10-CM

## 2024-04-12 DIAGNOSIS — N179 Acute kidney failure, unspecified: Secondary | ICD-10-CM

## 2024-04-12 DIAGNOSIS — M810 Age-related osteoporosis without current pathological fracture: Secondary | ICD-10-CM

## 2024-04-12 DIAGNOSIS — M059 Rheumatoid arthritis with rheumatoid factor, unspecified: Secondary | ICD-10-CM

## 2024-04-12 DIAGNOSIS — I2510 Atherosclerotic heart disease of native coronary artery without angina pectoris: Secondary | ICD-10-CM

## 2024-04-12 DIAGNOSIS — E669 Obesity, unspecified: Secondary | ICD-10-CM

## 2024-04-12 DIAGNOSIS — E782 Mixed hyperlipidemia: Secondary | ICD-10-CM

## 2024-04-12 DIAGNOSIS — I5032 Chronic diastolic (congestive) heart failure: Secondary | ICD-10-CM

## 2024-04-12 MED ORDER — FUROSEMIDE 40 MG PO TABS: 40.0000 mg | ORAL_TABLET | Freq: Two times a day (BID) | ORAL | 3 refills | Status: DC

## 2024-04-12 NOTE — Telephone Encounter (Signed)
 Copied from CRM #[id number]. Topic: Clinical - Medication Refill >> Apr 12, 2024 11:21 AM Tobias L wrote: Medication:  furosemide  (LASIX ) 40 MG tablet Patient requesting 90 day supply  Has the patient contacted their pharmacy? Yes Told to contact office for further refills.   This is the patient's preferred pharmacy:  [facility] [place] [place] - [address] [place] [address] Phone: [phone] Fax: [phone]  Is this the correct pharmacy for this prescription? Yes   Has the prescription been filled recently? No  Is the patient out of the medication? No  Has the patient been seen for an appointment in the last year OR does the patient have an upcoming appointment? Yes  Can we respond through MyChart? No  Agent: Please be advised that Rx refills may take up to 3 business days. We ask that you follow-up with your pharmacy.

## 2024-04-12 NOTE — Telephone Encounter (Signed)
 Pt requesting 90 day supply

## 2024-04-16 ENCOUNTER — Other Ambulatory Visit: Payer: Self-pay

## 2024-04-16 DIAGNOSIS — I5032 Chronic diastolic (congestive) heart failure: Secondary | ICD-10-CM

## 2024-04-16 DIAGNOSIS — I2510 Atherosclerotic heart disease of native coronary artery without angina pectoris: Secondary | ICD-10-CM

## 2024-04-16 DIAGNOSIS — M810 Age-related osteoporosis without current pathological fracture: Secondary | ICD-10-CM

## 2024-04-16 DIAGNOSIS — N179 Acute kidney failure, unspecified: Secondary | ICD-10-CM

## 2024-04-16 DIAGNOSIS — E782 Mixed hyperlipidemia: Secondary | ICD-10-CM

## 2024-04-16 DIAGNOSIS — M059 Rheumatoid arthritis with rheumatoid factor, unspecified: Secondary | ICD-10-CM

## 2024-04-16 DIAGNOSIS — Z79899 Other long term (current) drug therapy: Secondary | ICD-10-CM

## 2024-04-16 DIAGNOSIS — D6869 Other thrombophilia: Secondary | ICD-10-CM

## 2024-04-16 DIAGNOSIS — E669 Obesity, unspecified: Secondary | ICD-10-CM

## 2024-04-16 NOTE — Telephone Encounter (Signed)
 Copied from CRM [id number]. Topic: Clinical - Prescription Issue >> Apr 16, 2024  3:27 PM Cindy Holmes wrote: Reason for CRM: Patient states pharmacy is saying that they did not get the prescription for furosemide  (LASIX ) 40 MG tablet. Is requesting that is be sent again to:  [facility] - [place], [place] - [address] [place] [address] Phone: [phone] Fax: [phone]  Patient can be reached at [phone]

## 2024-04-17 ENCOUNTER — Other Ambulatory Visit: Payer: Self-pay

## 2024-04-17 DIAGNOSIS — H524 Presbyopia: Secondary | ICD-10-CM | POA: Diagnosis not present

## 2024-04-18 ENCOUNTER — Other Ambulatory Visit: Payer: Self-pay

## 2024-04-23 ENCOUNTER — Ambulatory Visit (HOSPITAL_COMMUNITY)
Admission: RE | Admit: 2024-04-23 | Discharge: 2024-04-23 | Disposition: A | Discharge status: Home / Self Care | Source: Ambulatory Visit

## 2024-04-23 DIAGNOSIS — K9400 Colostomy complication, unspecified: Secondary | ICD-10-CM

## 2024-04-23 DIAGNOSIS — Z933 Colostomy status: Secondary | ICD-10-CM | POA: Diagnosis not present

## 2024-04-23 DIAGNOSIS — L24B3 Irritant contact dermatitis related to fecal or urinary stoma or fistula: Secondary | ICD-10-CM

## 2024-04-23 NOTE — Discharge Instructions (Signed)
 Switching to 2 piece convex Stoma powder  Skin prep  Barrier ring BArrier  POuch  Barrier strips

## 2024-04-23 NOTE — Progress Notes (Signed)
 [facility]   Reason for visit:  LLQ colostomy, flush.  Patient had refused several calls for the ostomy clinic.  When asked about this, she states she had other appointments.  She was shown a one piece convex pouch in the hospital but is interested in a two piece system.  She is having difficulty getting her pouch cleaned when she has stooling. She prefers the two piece as she wants to remove the pouch and discard each time she has a bowel movement.  WE must discuss medicare allowables. And possibly closed end pouches. Spouse performs most ostomy care.  HPI:  Rheumatoid arthritis   Past Medical History:  Diagnosis Date   CAD (coronary artery disease)    a. PTCA LAD 2001/cath 2002-prox and mid LAD stents patent, PTCA ostium Dx 1 b. cath 03/2016: patent LAD stent with less than 40% in-stent restenosis, 10-30% stenosis of D1 and distal LAD with continued medical management recommended. 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   Family History  Problem Relation Age of Onset   Heart attack Mother    Heart disease Mother    Hyperlipidemia Mother    Arthritis/Rheumatoid Father    Cirrhosis Father        hepatic cirrhosis   Osteoporosis Father    Breast cancer Sister 68   Diabetes Sister    Diabetes Brother    Heart disease Brother    Heart disease Brother    Heart disease Maternal Aunt    Heart attack Maternal Aunt    Throat cancer Maternal Aunt    Heart attack  Maternal Uncle    Colon cancer Maternal Grandmother    Throat cancer Maternal Grandfather    Cancer Other        family history   Heart attack Other        family history   Allergies  Allergen Reactions   Codeine Phosphate Shortness Of Breath   Morphine And Codeine Nausea And Vomiting   Amoxicillin Nausea And Vomiting   Penicillins Nausea And Vomiting    Has patient had a PCN reaction causing immediate rash, facial/tongue/throat swelling, SOB or lightheadedness with hypotension:YES Has patient had a PCN reaction causing severe rash involving mucus membranes or skin necrosis: NO Has patient had a PCN reaction that required hospitalization NO Has patient had a PCN reaction occurring within the last 10 years: NO If all of the above answers are NO, then may proceed with Cephalosporin use.   Methotrexate  Nausea Only   Lidocaine  Rash    Reaction to lidocaine  patch   Current Outpatient Medications  Medication Sig Dispense Refill Last Dose/Taking   allopurinol  (ZYLOPRIM ) 100 MG tablet Take 1 tablet (100 mg total) by mouth daily. Resume this at 100 mg daily follow with your PCP to titrate this back up as needed.      apixaban  (ELIQUIS ) 5 MG TABS  tablet Take 1 tablet (5 mg total) by mouth 2 (two) times daily. 180 tablet 1    aspirin  EC 81 MG tablet Take 81 mg by mouth at bedtime. Swallow whole.      atorvastatin  (LIPITOR ) 40 MG tablet Take 2 tablets (80 mg total) by mouth daily 180 tablet 3    B Complex Vitamins (B COMPLEX VITAMIN PO) Take 1,000 mcg by mouth daily.      Cholecalciferol (VITAMIN D -3 PO) Take 50 mcg by mouth daily.      clobetasol cream (TEMOVATE) 0.05 % Apply 1 Application topically 2 (two) times daily.      Coenzyme Q10 (COQ10) 100 MG CAPS Take 1 capsule by mouth daily.      esomeprazole  (NEXIUM ) 20 MG capsule Take 20 mg by mouth daily at 12 noon.      ezetimibe  (ZETIA ) 10 MG tablet Take 1 tablet (10 mg total) by mouth daily. 90 tablet 3    folic acid  (FOLVITE ) 1 MG tablet  Take 1 mg by mouth at bedtime.      furosemide  (LASIX ) 40 MG tablet Take 1 tablet (40 mg total) by mouth 2 (two) times daily. 180 tablet 3    hydrocortisone  (CORTEF ) 10 MG tablet Take 15 mg (1.5 tablet) daily at 8 AM and 10 mg (1 tablet) daily at noon 270 tablet 1    hydroxychloroquine  (PLAQUENIL ) 200 MG tablet Take 200 mg by mouth 2 (two) times daily.      magnesium  oxide (MAG-OX) 400 MG tablet Take 400 mg by mouth daily.      methotrexate  (RHEUMATREX) 2.5 MG tablet Take 10 mg by mouth every Sunday.      metoprolol  tartrate (LOPRESSOR ) 25 MG tablet Take 1 tablet (25 mg total) by mouth every morning. & 50 mg in the evening 270 tablet 2    metoprolol  tartrate (LOPRESSOR ) 50 MG tablet Take 50 mg by mouth daily.      Misc. Devices (ADAPTER CAP) MISC 1 Application by Does not apply route in the morning and at bedtime. 500 each 3    Omega 3-6-9 Fatty Acids (OMEGA 3-6-9 PO) Take 1 capsule by mouth daily.      Omega-3 Fatty Acids (FISH OIL) 1200 MG CAPS Take 1 capsule by mouth daily.      Ostomy Supplies (ADAPT CERARING) MISC 1 Ring by Does not apply route in the morning and at bedtime. 180 each 5    Ostomy Supplies (PREMIER CERAPLUS 2 1/8) Pouch MISC 1 Bag by Does not apply route in the morning and at bedtime. 180 each 5    potassium chloride  SA (KLOR-CON  M) 20 MEQ tablet Take 1 tablet (20 mEq total) by mouth 2 (two) times daily. 60 tablet 7    Semaglutide -Weight Management (WEGOVY ) 1 MG/0.5ML SOAJ Inject 1 mg into the skin once a week. WHEN YOU FINISH THE 0.5 MG 2 mL 5    spironolactone  (ALDACTONE ) 25 MG tablet TAKE 1 TABLET BY MOUTH DAILY 90 tablet 2    zolpidem  (AMBIEN ) 5 MG tablet Take 5 mg by mouth at bedtime.      No current facility-administered medications for this encounter.   ROS  Review of Systems  Constitutional:  Positive for fatigue.  Respiratory:  Positive for shortness of breath.        CHF  Cardiovascular:        Hypertension CAD   Gastrointestinal:  Negative for diarrhea.        Colostomy  Musculoskeletal:  Positive for gait  problem and joint swelling.       Hx shoulder surgery Impaired mobility due to arthritis  wheelchair bound  Skin:  Positive for rash.       Irritant contact dermatitis   Neurological:        Hx CVA reported  Psychiatric/Behavioral:  Positive for agitation.        Unhappy that stoma is likely permanent.    All other systems reviewed and are negative.  Vital signs:  BP (!) 131/59 (BP Location: Right Arm)   Pulse 65   Temp 98.6 F (37 C) (Oral)   Resp 17   SpO2 96%  Exam:  Physical Exam Vitals reviewed.  Constitutional:      Appearance: She is obese.  HENT:     Mouth/Throat:     Mouth: Mucous membranes are moist.  Eyes:     Comments: Some visual impairment.   Cardiovascular:     Rate and Rhythm: Normal rate.     Pulses: Normal pulses.  Pulmonary:     Effort: Pulmonary effort is normal.  Abdominal:     Palpations: Abdomen is soft.  Musculoskeletal:     Comments: Wheelchair bound Hx rotator cuff repair.   Neurological:     Mental Status: She is alert.     Stoma type/location:  LMQ flush colostomy Stomal assessment/size:  1 3/8 slightly oval Peristomal assessment:  denuded erythematous skin from medical adhesive stripping (daily pouch changes)  Treatment options for stomal/peristomal skin: stoma powder and skin prep to irritated skin. Can apply 2 squirts FLonase for topical steroid when needed for red, intact skin.  Demonstrated this today.  Explained rationale that this would promote healing in irritated skin and that a cream would prevent the pouch from sticking.   Output: soft brown stool  Ostomy pouching: 2 pc. Convex with barrier ring.  FLonase, stoma powder and skin prep when needed for skin irritation.  Education provided:  I discuss switching to 2 piece pouch and the use of closed end pouches if she is going to throw away pouches each time. She is also informed that Medicare is not likely to cover the number of  pouches she will use if changing this often.  The 2 piece will allow the barrier to stay in place and protect the skin.  I ask her to demonstrate removing the pouch and re-applying a new one due to concerns about her dexterity and visual impairments.  We will attempt to order pre-sized barriers to make things easier.  Her spouse is willing to perform ostomy care.  She voices displeasure that she was not shown all pouching options in the hospital (one piece vs 2 piece)  I explain that in the limited hospital stay, the WOC team's focus is to determine the pouching system that best fits the patient and teach them how to care for their ostomy.  The purpose of the outpatient clinic is to fine tune the pouching steps and adapt to the patient's lifestyle and needs.  Due to her flush stoma, rounded abdomen and wheelchair bound status, the one piece convex pouch makes sense.  The closed end pouch and 2 piece makes sense due to patient dislike for emptying and reusing pouches.  If they are willing to pay out of pocket, this option makes sense.      Impression/dx  Colostomy complication  Irritant contact dermatitis Discussion  See above  1 piece vs 2 piece.  Closed end vs roll closed reusable pouches.  Medicare coverage  amounts ostomy supplies.  Plan  I will send new prescription to [facility].     Visit time: 55 minutes.   Darice Cooley FNP-BC

## 2024-04-24 DIAGNOSIS — L24B3 Irritant contact dermatitis related to fecal or urinary stoma or fistula: Secondary | ICD-10-CM | POA: Insufficient documentation

## 2024-04-24 DIAGNOSIS — K9400 Colostomy complication, unspecified: Secondary | ICD-10-CM | POA: Insufficient documentation

## 2024-05-01 ENCOUNTER — Ambulatory Visit (INDEPENDENT_AMBULATORY_CARE_PROVIDER_SITE_OTHER)

## 2024-05-01 VITALS — BP 100/71 | HR 74 | Ht 63.0 in | Wt 210.0 lb

## 2024-05-01 DIAGNOSIS — M25511 Pain in right shoulder: Secondary | ICD-10-CM | POA: Diagnosis not present

## 2024-05-01 DIAGNOSIS — M810 Age-related osteoporosis without current pathological fracture: Secondary | ICD-10-CM | POA: Diagnosis not present

## 2024-05-01 DIAGNOSIS — K9400 Colostomy complication, unspecified: Secondary | ICD-10-CM

## 2024-05-01 DIAGNOSIS — D6869 Other thrombophilia: Secondary | ICD-10-CM | POA: Diagnosis not present

## 2024-05-01 DIAGNOSIS — E782 Mixed hyperlipidemia: Secondary | ICD-10-CM

## 2024-05-01 DIAGNOSIS — G8929 Other chronic pain: Secondary | ICD-10-CM

## 2024-05-01 DIAGNOSIS — M059 Rheumatoid arthritis with rheumatoid factor, unspecified: Secondary | ICD-10-CM

## 2024-05-01 DIAGNOSIS — I2510 Atherosclerotic heart disease of native coronary artery without angina pectoris: Secondary | ICD-10-CM | POA: Diagnosis not present

## 2024-05-01 DIAGNOSIS — Z79899 Other long term (current) drug therapy: Secondary | ICD-10-CM

## 2024-05-01 DIAGNOSIS — Z23 Encounter for immunization: Secondary | ICD-10-CM

## 2024-05-01 DIAGNOSIS — E669 Obesity, unspecified: Secondary | ICD-10-CM | POA: Diagnosis not present

## 2024-05-01 DIAGNOSIS — E559 Vitamin D deficiency, unspecified: Secondary | ICD-10-CM | POA: Diagnosis not present

## 2024-05-01 DIAGNOSIS — F5101 Primary insomnia: Secondary | ICD-10-CM

## 2024-05-01 DIAGNOSIS — I5032 Chronic diastolic (congestive) heart failure: Secondary | ICD-10-CM | POA: Diagnosis not present

## 2024-05-01 DIAGNOSIS — E2740 Unspecified adrenocortical insufficiency: Secondary | ICD-10-CM | POA: Diagnosis not present

## 2024-05-01 DIAGNOSIS — Z939 Artificial opening status, unspecified: Secondary | ICD-10-CM

## 2024-05-01 DIAGNOSIS — R040 Epistaxis: Secondary | ICD-10-CM | POA: Diagnosis not present

## 2024-05-01 DIAGNOSIS — N179 Acute kidney failure, unspecified: Secondary | ICD-10-CM

## 2024-05-01 DIAGNOSIS — Z933 Colostomy status: Secondary | ICD-10-CM

## 2024-05-01 MED ORDER — TRAMADOL HCL 50 MG PO TABS: 50.0000 mg | ORAL_TABLET | Freq: Four times a day (QID) | ORAL | 0 refills | Status: AC | PRN

## 2024-05-01 MED ORDER — ZOLPIDEM TARTRATE 5 MG PO TABS: 5.0000 mg | ORAL_TABLET | Freq: Every day | ORAL | 0 refills | Status: DC

## 2024-05-01 NOTE — Progress Notes (Signed)
 Established Patient Office Visit  Subjective   Patient ID: Cindy Holmes, female    DOB: 31-Oct-1950  Age: 72 y.o. MRN: [id number]  Chief Complaint  Patient presents with   Medical Management of Chronic Issues    Pt here for an 8wk follow up    HPI Discussed the use of AI scribe software for clinical note transcription with the patient, who gave verbal consent to proceed.  History of Present Illness   Cindy Holmes is a 73 year old female who presents with issues related to colostomy management.  Colostomy complications - Ongoing bleeding and erythema at the colostomy site - Previous surgical consultation declined reversal due to complex medical history - Referral to colostomy clinic resulted in trial of a new colostomy bag, which frequently snaps off and does not adhere properly - Difficulty obtaining sufficient colostomy supplies due to Medicare coverage limitations, leading to challenges with frequent bag changes  Arthralgia and arthritis symptoms - Chronic arthritis with pain, swelling, and immobility, which worsen significantly without prednisone  - Current management includes hydrocortisone  (Cortef ) - Previously treated with methotrexate  and prednisone  - Discontinued care with rheumatologist due to dissatisfaction with communication and care - No recent use of tramadol , which was previously effective for pain control  Epistaxis - Frequent episodes of epistaxis, predominantly unilateral and difficult to control  Congestive symptoms and pulmonary history - History of congestive heart failure - Recent episode of pulmonary congestion without associated fever or upper respiratory symptoms, suspected to be related to CHF - Prior episode of pneumonia confirmed by chest x-ray after initial clinical dismissal  Insomnia - Chronic difficulty initiating sleep, often awake until 3 or 4 AM - Currently taking zolpidem  for sleep  Chronic shoulder pain and limited mobility -  History of unsuccessful shoulder surgery resulting in persistent pain and restricted range of motion      Patient Active Problem List   Diagnosis Date Noted   Recurrent epistaxis 05/06/2024   Chronic right shoulder pain 05/06/2024   Irritant contact dermatitis associated with fecal stoma 04/24/2024   Colostomy complication (HCC) 04/24/2024   Morbid obesity (HCC) 03/11/2024   Long-term use of high-risk medication 03/11/2024   Presence of colostomy (HCC) 03/11/2024   Elevated antinuclear antibody (ANA) level 03/06/2024   Insomnia 03/06/2024   Long term current use of therapeutic drug 03/06/2024   Hypocortisolemia 02/09/2024   Prediabetes 02/09/2024   Vitamin D  deficiency 02/09/2024   Secondary hypercoagulability disorder 09/07/2023   Medication management 09/07/2023   Class 2 severe obesity with serious comorbidity and body mass index (BMI) of 36.0 to 36.9 in adult 09/07/2023   CHF exacerbation (HCC) 03/30/2023   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   Immunosuppression due to drug therapy 03/06/2023   Wheelchair dependent 03/06/2023   Morbid obesity with body mass index (BMI) of 40.0 to 49.9 (HCC) 03/02/2023   Diarrhea 02/02/2023   Osteoporosis 08/10/2022   Diverticulitis 10/26/2020   PAF (paroxysmal atrial fibrillation) (HCC) 10/26/2020   Acute diverticulitis 10/26/2020   Nausea vomiting and diarrhea    Closed displaced trimalleolar fracture of right ankle 05/28/2018   Fall at home, initial encounter 05/17/2018   Multiple fractures 05/14/2018   Fracture of distal end of right radius 05/14/2018   Tibial plateau fracture, right, closed, initial encounter 05/14/2018   Candida esophagitis (HCC) 12/03/2016   Hypokalemia 12/02/2016   Lactic acidosis 12/02/2016   AKI (acute kidney injury) 12/02/2016   Chronic heart failure with preserved ejection fraction (HCC) 12/02/2016  Coronary artery disease involving native coronary artery of native heart without angina pectoris  11/13/2015   Chest pain 11/13/2015   Hyperlipemia 11/13/2015   Pain in joint, shoulder region 01/16/2014   Muscle weakness (generalized) 01/16/2014   Decreased range of motion of right shoulder 01/16/2014   Right rotator cuff tear 11/16/2013   Rheumatoid arthritis (HCC)    Essential hypertension    Hyperlipidemia    Stroke [facility])    Emotional stress reaction    Leg pain    Carotid artery disease    Overweight 11/04/2008   EDEMA 11/04/2008    ROS    Objective:     BP 100/71   Pulse 74   Ht 5' 3 (1.6 m)   Wt 210 lb (95.3 kg)   SpO2 94%   BMI 37.20 kg/m  BP Readings from Last 3 Encounters:  05/01/24 100/71  04/23/24 (!) 131/59  03/23/24 (!) 119/50   Wt Readings from Last 3 Encounters:  05/01/24 210 lb (95.3 kg)  03/23/24 210 lb (95.3 kg)  03/06/24 210 lb (95.3 kg)     Physical Exam Vitals and nursing note reviewed. Exam conducted with a chaperone present (husband is with her).  Constitutional:      Appearance: She is obese.  HENT:     Head: Normocephalic.     Right Ear: Tympanic membrane, ear canal and external ear normal.     Left Ear: Tympanic membrane, ear canal and external ear normal.     Nose: Nose normal.     Mouth/Throat:     Mouth: Mucous membranes are moist.     Pharynx: Oropharynx is clear.  Cardiovascular:     Rate and Rhythm: Normal rate and regular rhythm.  Pulmonary:     Effort: Pulmonary effort is normal.     Breath sounds: Normal breath sounds.  Abdominal:     General: The ostomy site is clean. Bowel sounds are decreased.     Tenderness: There is no abdominal tenderness.  Musculoskeletal:     Cervical back: Normal range of motion and neck supple.     Right lower leg: Edema present.     Left lower leg: Edema present.  Skin:    General: Skin is warm and dry.  Neurological:     Mental Status: She is alert and oriented to person, place, and time.     Gait: Gait abnormal (ambulates with a power scooter).  Psychiatric:        Mood and  Affect: Mood normal.        Thought Content: Thought content normal.        The ASCVD Risk score (Arnett DK, et al., 2019) failed to calculate for the following reasons:   Risk score cannot be calculated because patient has a medical history suggesting prior/existing ASCVD    Assessment & Plan:   Problem List Items Addressed This Visit       Cardiovascular and Mediastinum   Coronary artery disease involving native coronary artery of native heart without angina pectoris (Chronic)   Relevant Orders   Lipid Profile (Completed)   CBC w/Diff (Completed)   Pro b natriuretic peptide (BNP) (Completed)   Recurrent epistaxis   Frequent left-sided epistaxis likely exacerbated by Eliquis . - Recommend Afrin nasal spray for acute epistaxis. - Advise use of saline nasal spray to maintain nasal moisture. - Consider referral to ENT if no improvement with conservative measures.         Musculoskeletal and Integument   Osteoporosis  Genitourinary   AKI (acute kidney injury)   Relevant Orders   Lipid Profile (Completed)   CBC w/Diff (Completed)   Pro b natriuretic peptide (BNP) (Completed)     Other   Presence of colostomy (HCC) (Chronic)   She recently had a visit at the ostomy care clinic and they provided her with a different type of bag.  She states that this bag continues to pop off with increase in gas.  Ongoing colostomy issues with bleeding, redness, and bag detachment. Limited success with different bags. Medicare supply limitations noted. - Continue follow-up with colostomy clinic.       Pain in joint, shoulder region   Relevant Medications   traMADol  (ULTRAM ) 50 MG tablet   Hyperlipemia - Primary   Relevant Orders   Lipid Profile (Completed)   CBC w/Diff (Completed)   Pro b natriuretic peptide (BNP) (Completed)   Morbid obesity with body mass index (BMI) of 40.0 to 49.9 (HCC)   Relevant Orders   Lipid Profile (Completed)   CBC w/Diff (Completed)   Pro b  natriuretic peptide (BNP) (Completed)   Secondary hypercoagulability disorder   Obtain labs for reassessment.      Relevant Orders   Lipid Profile (Completed)   CBC w/Diff (Completed)   Pro b natriuretic peptide (BNP) (Completed)   Insomnia   Chronic insomnia despite current medication regimen. - Refill zolpidem  prescription through [facility].      Relevant Medications   zolpidem  (AMBIEN ) 5 MG tablet   Colostomy complication (HCC)    Ongoing colostomy issues with bleeding, redness, and bag detachment. Limited success with different bags. Medicare supply limitations noted. - Continue follow-up with colostomy clinic. - Consider alternative colostomy bag designs or additional support such as a belt.            Chronic right shoulder pain   Chronic right shoulder pain, status post failed rotator cuff repair Persistent pain and limited movement post failed rotator cuff repair. Screws have fallen out. - Consider referral to orthopedic specialist for further evaluation and management.      Relevant Medications   traMADol  (ULTRAM ) 50 MG tablet   Other Visit Diagnoses       Encounter for immunization       Relevant Orders   Flu vaccine HIGH DOSE PF(Fluzone Trivalent) (Completed)     Need for pneumococcal vaccination       Relevant Orders   Pneumococcal conjugate vaccine 20-valent (Prevnar 20) (Completed)     History of creation of ostomy (HCC)            Return in about 3 months (around 08/01/2024) for chronic follow-up with PCP.    Leita Longs, FNP

## 2024-05-02 ENCOUNTER — Encounter (HOSPITAL_COMMUNITY): Payer: Self-pay | Admitting: Nurse Practitioner

## 2024-05-02 ENCOUNTER — Other Ambulatory Visit: Payer: Self-pay

## 2024-05-02 ENCOUNTER — Ambulatory Visit: Payer: Self-pay

## 2024-05-02 ENCOUNTER — Telehealth: Payer: Self-pay

## 2024-05-02 ENCOUNTER — Other Ambulatory Visit (HOSPITAL_COMMUNITY): Payer: Self-pay | Admitting: Nurse Practitioner

## 2024-05-02 DIAGNOSIS — L24B3 Irritant contact dermatitis related to fecal or urinary stoma or fistula: Secondary | ICD-10-CM

## 2024-05-02 DIAGNOSIS — E669 Obesity, unspecified: Secondary | ICD-10-CM

## 2024-05-02 DIAGNOSIS — I2510 Atherosclerotic heart disease of native coronary artery without angina pectoris: Secondary | ICD-10-CM

## 2024-05-02 DIAGNOSIS — E782 Mixed hyperlipidemia: Secondary | ICD-10-CM

## 2024-05-02 DIAGNOSIS — N179 Acute kidney failure, unspecified: Secondary | ICD-10-CM

## 2024-05-02 DIAGNOSIS — I5032 Chronic diastolic (congestive) heart failure: Secondary | ICD-10-CM

## 2024-05-02 DIAGNOSIS — K9400 Colostomy complication, unspecified: Secondary | ICD-10-CM

## 2024-05-02 DIAGNOSIS — Z79899 Other long term (current) drug therapy: Secondary | ICD-10-CM

## 2024-05-02 DIAGNOSIS — D6869 Other thrombophilia: Secondary | ICD-10-CM

## 2024-05-02 DIAGNOSIS — M059 Rheumatoid arthritis with rheumatoid factor, unspecified: Secondary | ICD-10-CM

## 2024-05-02 DIAGNOSIS — M810 Age-related osteoporosis without current pathological fracture: Secondary | ICD-10-CM

## 2024-05-02 LAB — COMPREHENSIVE METABOLIC PANEL WITH GFR
ALT: 13 IU/L (ref 0–32)
AST: 14 IU/L (ref 0–40)
Albumin: 4.1 g/dL (ref 3.8–4.8)
Alkaline Phosphatase: 98 IU/L (ref 49–135)
BUN/Creatinine Ratio: 28 (ref 12–28)
BUN: 49 mg/dL — ABNORMAL HIGH (ref 8–27)
Bilirubin Total: 0.5 mg/dL (ref 0.0–1.2)
CO2: 29 mmol/L (ref 20–29)
Calcium: 9.7 mg/dL (ref 8.7–10.3)
Chloride: 86 mmol/L — ABNORMAL LOW (ref 96–106)
Creatinine, Ser: 1.75 mg/dL — ABNORMAL HIGH (ref 0.57–1.00)
Globulin, Total: 2.7 g/dL (ref 1.5–4.5)
Glucose: 126 mg/dL — ABNORMAL HIGH (ref 70–99)
Potassium: 3 mmol/L — ABNORMAL LOW (ref 3.5–5.2)
Sodium: 135 mmol/L (ref 134–144)
Total Protein: 6.8 g/dL (ref 6.0–8.5)
eGFR: 31 mL/min/1.73 — ABNORMAL LOW (ref >59)

## 2024-05-02 LAB — CBC WITH DIFFERENTIAL/PLATELET
Basophils Absolute: 0.1 x10E3/uL (ref 0.0–0.2)
Basos: 1 %
EOS (ABSOLUTE): 0.4 x10E3/uL (ref 0.0–0.4)
Eos: 4 %
Hematocrit: 34.7 % (ref 34.0–46.6)
Hemoglobin: 11.1 g/dL (ref 11.1–15.9)
Immature Grans (Abs): 0.1 x10E3/uL (ref 0.0–0.1)
Immature Granulocytes: 1 %
Lymphocytes Absolute: 1.3 x10E3/uL (ref 0.7–3.1)
Lymphs: 11 %
MCH: 29.7 pg (ref 26.6–33.0)
MCHC: 32 g/dL (ref 31.5–35.7)
MCV: 93 fL (ref 79–97)
Monocytes Absolute: 0.8 x10E3/uL (ref 0.1–0.9)
Monocytes: 7 %
Neutrophils Absolute: 9 x10E3/uL — ABNORMAL HIGH (ref 1.4–7.0)
Neutrophils: 76 %
Platelets: 323 x10E3/uL (ref 150–450)
RBC: 3.74 x10E6/uL — ABNORMAL LOW (ref 3.77–5.28)
RDW: 16.5 % — ABNORMAL HIGH (ref 11.7–15.4)
WBC: 11.8 x10E3/uL — ABNORMAL HIGH (ref 3.4–10.8)

## 2024-05-02 LAB — LIPID PANEL
Chol/HDL Ratio: 1.9 ratio (ref 0.0–4.4)
Chol/HDL Ratio: 1.9 ratio (ref 0.0–4.4)
Cholesterol, Total: 155 mg/dL (ref 100–199)
Cholesterol, Total: 155 mg/dL (ref 100–199)
HDL: 81 mg/dL (ref >39)
HDL: 82 mg/dL (ref >39)
LDL Chol Calc (NIH): 53 mg/dL (ref 0–99)
LDL Chol Calc (NIH): 53 mg/dL (ref 0–99)
Triglycerides: 124 mg/dL (ref 0–149)
Triglycerides: 117 mg/dL (ref 0–149)
VLDL Cholesterol Cal: 21 mg/dL (ref 5–40)
VLDL Cholesterol Cal: 20 mg/dL (ref 5–40)

## 2024-05-02 LAB — TSH: TSH: 3.08 u[IU]/mL (ref 0.450–4.500)

## 2024-05-02 LAB — PRO B NATRIURETIC PEPTIDE: NT-Pro BNP: 652 pg/mL — ABNORMAL HIGH (ref 0–301)

## 2024-05-02 LAB — T4, FREE: Free T4: 1.64 ng/dL (ref 0.82–1.77)

## 2024-05-02 LAB — VITAMIN D 25 HYDROXY (VIT D DEFICIENCY, FRACTURES): Vit D, 25-Hydroxy: 69.6 ng/mL (ref 30.0–100.0)

## 2024-05-02 MED ORDER — FUROSEMIDE 40 MG PO TABS: 40.0000 mg | ORAL_TABLET | Freq: Two times a day (BID) | ORAL | 0 refills | Status: DC

## 2024-05-02 NOTE — Telephone Encounter (Signed)
 Sent to [facility]

## 2024-05-02 NOTE — Telephone Encounter (Signed)
 Spoke with pt discussed her potassium level, advised her to increase her potassium to 40meq twice daily per Dr. Barbette orders. Pt voiced understanding and stated she may need a refill. Stated she would call the office back to confirm.

## 2024-05-02 NOTE — Telephone Encounter (Signed)
 Pt called the office back stating she has been taking 10meq of potassium instead of 20. Do you still want her to increase to ?

## 2024-05-02 NOTE — Telephone Encounter (Signed)
 Spoke with pt advising her increase potassium to 20mEq twice daily instead of 40 per Dr.Nida's orders. Pt stated she would need a Rx for potassium SR M ZP, a capsule form of potassium. States she can not swallow the tablet.

## 2024-05-02 NOTE — Telephone Encounter (Signed)
-----   Message from Sellers Nida sent at 05/02/2024  8:07 AM EDT ----- Zada, When you get a chance, would you inform this patient to increase her potassium to 40 meq 2 times a day?  Thanks. ----- Message ----- From: Rebecka Memos Lab Results In Sent: 05/02/2024   5:37 AM EDT To: Ethelle LELON Earl, MD

## 2024-05-02 NOTE — Telephone Encounter (Signed)
 Copied from CRM [id number]. Topic: Clinical - Prescription Issue >> May 02, 2024  3:13 PM Montie POUR wrote: Reason for CRM:  [facility] Delivery said they did not received reorder for furosemide  (LASIX ) 40 MG tablet that was sent on 04/12/24. Please send this 90 day supply of furosemide  (LASIX ) 40 MG tablet to [facility] at [address]; [place], [place] >> May 02, 2024  3:18 PM Montie POUR wrote: She is now out of this medication. Please rush.

## 2024-05-06 ENCOUNTER — Ambulatory Visit: Payer: Self-pay

## 2024-05-06 DIAGNOSIS — G8929 Other chronic pain: Secondary | ICD-10-CM | POA: Insufficient documentation

## 2024-05-06 DIAGNOSIS — R040 Epistaxis: Secondary | ICD-10-CM | POA: Insufficient documentation

## 2024-05-06 NOTE — Assessment & Plan Note (Signed)
 Discontinued care with previous rheumatologist. Inadequate current pain management. - Prescribe tramadol  for arthritis pain. - Advise taking tramadol  with Tylenol  for additional pain relief.

## 2024-05-06 NOTE — Assessment & Plan Note (Signed)
 She recently had a visit at the ostomy care clinic and they provided her with a different type of bag.  She states that this bag continues to pop off with increase in gas.  Ongoing colostomy issues with bleeding, redness, and bag detachment. Limited success with different bags. Medicare supply limitations noted. - Continue follow-up with colostomy clinic.

## 2024-05-06 NOTE — Assessment & Plan Note (Signed)
 Obtain labs for reassessment.

## 2024-05-06 NOTE — Assessment & Plan Note (Signed)
 Chronic right shoulder pain, status post failed rotator cuff repair Persistent pain and limited movement post failed rotator cuff repair. Screws have fallen out. - Consider referral to orthopedic specialist for further evaluation and management.

## 2024-05-06 NOTE — Assessment & Plan Note (Signed)
 Chronic insomnia despite current medication regimen. - Refill zolpidem  prescription through [facility].

## 2024-05-06 NOTE — Assessment & Plan Note (Signed)
 Congestive heart failure Recent lung congestion possibly due to CHF exacerbation. No fever or head cold symptoms. - Consider further evaluation if symptoms persist.

## 2024-05-06 NOTE — Assessment & Plan Note (Signed)
 Frequent left-sided epistaxis likely exacerbated by Eliquis . - Recommend Afrin nasal spray for acute epistaxis. - Advise use of saline nasal spray to maintain nasal moisture. - Consider referral to ENT if no improvement with conservative measures.

## 2024-05-06 NOTE — Assessment & Plan Note (Signed)
 Ongoing colostomy issues with bleeding, redness, and bag detachment. Limited success with different bags. Medicare supply limitations noted. - Continue follow-up with colostomy clinic. - Consider alternative colostomy bag designs or additional support such as a belt.

## 2024-05-07 ENCOUNTER — Other Ambulatory Visit: Payer: Self-pay

## 2024-05-09 ENCOUNTER — Other Ambulatory Visit: Payer: Self-pay

## 2024-05-09 DIAGNOSIS — I480 Paroxysmal atrial fibrillation: Secondary | ICD-10-CM

## 2024-05-09 MED ORDER — APIXABAN 5 MG PO TABS
5.0000 mg | ORAL_TABLET | Freq: Two times a day (BID) | ORAL | 1 refills | Status: AC
Start: 2024-05-09 — End: ?

## 2024-05-09 NOTE — Telephone Encounter (Signed)
 Copied from CRM [id number]. Topic: Clinical - Medication Refill >> May 09, 2024 11:11 AM Ivette P wrote: Medication: apixaban  (ELIQUIS ) 5 MG TABS tablet   Has the patient contacted their pharmacy? apixaban  (ELIQUIS ) 5 MG TABS tablet (Agent: If no, request that the patient contact the pharmacy for the refill. If patient does not wish to contact the pharmacy document the reason why and proceed with request.) (Agent: If yes, when and what did the pharmacy advise?)  This is the patient's preferred pharmacy:  [facility] [place] [place] - [address] [place] [address] Phone: [phone] Fax: [phone]   Is this the correct pharmacy for this prescription? Yes If no, delete pharmacy and type the correct one.   Has the prescription been filled recently? No  Is the patient out of the medication? Yes  Has the patient been seen for an appointment in the last year OR does the patient have an upcoming appointment? Yes  Can we respond through MyChart? Yes  Agent: Please be advised that Rx refills may take up to 3 business days. We ask that you follow-up with your pharmacy.

## 2024-05-17 ENCOUNTER — Encounter: Payer: Self-pay | Admitting: "Endocrinology

## 2024-05-17 ENCOUNTER — Ambulatory Visit: Attending: Cardiology | Admitting: Cardiology

## 2024-05-17 ENCOUNTER — Encounter: Payer: Self-pay | Admitting: Cardiology

## 2024-05-17 ENCOUNTER — Ambulatory Visit: Admitting: "Endocrinology

## 2024-05-17 VITALS — BP 116/73 | HR 63 | Ht 63.0 in | Wt 210.0 lb

## 2024-05-17 VITALS — BP 118/62 | HR 72 | Ht 63.0 in | Wt 216.0 lb

## 2024-05-17 DIAGNOSIS — E782 Mixed hyperlipidemia: Secondary | ICD-10-CM | POA: Diagnosis not present

## 2024-05-17 DIAGNOSIS — I2510 Atherosclerotic heart disease of native coronary artery without angina pectoris: Secondary | ICD-10-CM | POA: Insufficient documentation

## 2024-05-17 DIAGNOSIS — E669 Obesity, unspecified: Secondary | ICD-10-CM | POA: Diagnosis not present

## 2024-05-17 DIAGNOSIS — R7303 Prediabetes: Secondary | ICD-10-CM | POA: Diagnosis not present

## 2024-05-17 DIAGNOSIS — E2740 Unspecified adrenocortical insufficiency: Secondary | ICD-10-CM | POA: Insufficient documentation

## 2024-05-17 DIAGNOSIS — N179 Acute kidney failure, unspecified: Secondary | ICD-10-CM | POA: Diagnosis not present

## 2024-05-17 DIAGNOSIS — I5032 Chronic diastolic (congestive) heart failure: Secondary | ICD-10-CM | POA: Diagnosis not present

## 2024-05-17 DIAGNOSIS — Z79899 Other long term (current) drug therapy: Secondary | ICD-10-CM | POA: Insufficient documentation

## 2024-05-17 LAB — POCT GLYCOSYLATED HEMOGLOBIN (HGB A1C): HbA1c, POC (prediabetic range): 5.9 % (ref 5.7–6.4)

## 2024-05-17 MED ORDER — OZEMPIC (0.25 OR 0.5 MG/DOSE) 2 MG/3ML ~~LOC~~ SOPN: 0.2500 mg | PEN_INJECTOR | SUBCUTANEOUS | 4 refills | Status: DC

## 2024-05-17 NOTE — Patient Instructions (Addendum)
 Medication Instructions:   STOP TAKING ASPIRIN  81 MG   *If you need a refill on your cardiac medications before your next appointment, please call your pharmacy*   Lab Work:  BMP If you have labs (blood work) drawn today and your tests are completely normal, you will receive your results only by: MyChart Message (if you have MyChart) OR A paper copy in the mail If you have any lab test that is abnormal or we need to change your treatment, we will call you to review the results.   Testing/Procedures:  NOT NEEDED  Follow-Up: At [facility], you and your health needs are our priority.  As part of our continuing mission to provide you with exceptional heart care, we have created designated Provider Care Teams.  These Care Teams include your primary Cardiologist (physician) and Advanced Practice Providers (APPs -  Physician Assistants and Nurse Practitioners) who all work together to provide you with the care you need, when you need it.     Your next appointment:   6 month(s)  The format for your next appointment:   In Person  Provider:   Newman JINNY Lawrence, MD

## 2024-05-17 NOTE — Progress Notes (Signed)
 Cardiology Office Note:  .   Date:  05/17/2024  ID:  Cindy Holmes, DOB 1950-10-13, MRN [id number] PCP: Bevely Doffing, FNP  [facility] Providers Cardiologist:  Newman Lawrence, MD PCP: Bevely Doffing, FNP  Chief Complaint  Patient presents with   Shortness of Breath      History of Present Illness: .    Cindy Holmes is a 73 y.o. 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  Patient has stable exertional dyspnea, leg edema, that is controlled with medications.  Recently, she was found to have Addison's disease, is seeing endocrinologist in [place].  Separately, she was seen by PCP 2 weeks ago and was found to have elevated creatinine at 1.75, with previous creatinine 0.97 08/2023.   Vitals:   05/17/24 1107  BP: 116/73  Pulse: 63  SpO2: 96%   Last Weight  Most recent update: 05/17/2024 11:11 AM    Weight  95.3 kg (210 lb)             ROS:  Review of Systems  Cardiovascular:  Positive for leg swelling. Negative for chest pain, dyspnea on exertion, palpitations and syncope.     Studies Reviewed: SABRA        EKG 09/07/2023: Sinus rhythm 58 bpm When compared with ECG of 30-Mar-2023 05:10, Premature supraventricular complexes are no longer Present Vent. rate has decreased BY  47 BPM T wave inversion no longer evident in Inferior leads T wave inversion no longer evident in Lateral leads QT has shortened    Echocardiogram 03/30/2023: 1. Left ventricular ejection fraction, by estimation, is 60 to 65%. The  left ventricle has normal function. The left ventricle has no regional  wall motion abnormalities. Left ventricular diastolic function could not  be evaluated.   2. Right ventricular systolic function is normal. The right ventricular  size is normal. There is moderately elevated pulmonary artery  systolic  pressure. The estimated right ventricular systolic pressure is 56.4 mmHg.   3. The mitral valve is normal in structure. Mild mitral valve  regurgitation. No evidence of mitral stenosis.   4. The aortic valve was not well visualized. Aortic valve regurgitation  is not visualized. Mild aortic valve stenosis. Aortic valve area, by VTI  measures 1.77 cm. Aortic valve mean gradient measures 15.0 mmHg. Aortic  valve Vmax measures 2.58 m/s.   5. The inferior vena cava is dilated in size with >50% respiratory  variability, suggesting right atrial pressure of 8 mmHg.   Comparison(s): Changes from prior study are noted. Mild AS is new.    Labs 04/2024: Chol 155, TG 117, HDL 82, LDL 53 Hb 11.1 Cr 8.24, eGFR 31, K 3.0 ProBNP 652 TSH 3.0  08/2023: Cr 0.97 ProBNP 426    Risk Assessment/Calculations:    CHA2DS2-VASc Score = 6  This indicates a 9.7% annual risk of stroke. The patient's score is based upon: CHF History: 1 HTN History: 1 Diabetes History: 0 Stroke History: 2 Vascular Disease History: 0 Age Score: 1 Gender Score: 1    Physical Exam:   Physical Exam Vitals and nursing note reviewed.  Constitutional:      General: She is not in acute distress. Neck:     Vascular: No JVD.  Cardiovascular:     Rate and Rhythm: Normal rate and regular rhythm.     Heart sounds: Normal heart sounds. No murmur heard. Pulmonary:  Effort: Pulmonary effort is normal.     Breath sounds: Normal breath sounds. No wheezing or rales.  Musculoskeletal:     Right lower leg: Edema (Trace) present.     Left lower leg: Edema (Trace) present.      VISIT DIAGNOSES:   ICD-10-CM   1. AKI (acute kidney injury)  N17.9 Basic metabolic panel with GFR    2. Coronary artery disease involving native coronary artery of native heart without angina pectoris  I25.10     3. Chronic heart failure with preserved ejection fraction (HCC)  I50.32     4. Obesity without serious comorbidity,  unspecified class, unspecified obesity type  E66.9     5. Mixed hyperlipidemia  E78.2     6. Medication management  Z79.899     7. Secondary hypercoagulability disorder  D68.69     8. Rheumatoid arthritis with positive rheumatoid factor, involving unspecified site (HCC)  M05.9     9. 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    Cindy Holmes is a 73 y.o. 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 She takes spironolactone  as needed, but does take Lasix  40 mg twice daily, along with K-Lor 40 mEq twice daily. Given recent AKI, I will check BMP today before further recommendations.  CAD: No anginal symptoms at this time. Continue Lipitor  40 mg daily, Zetia  10 mg daily.   Given that she is on Eliquis , I do not think she needs Aspirin .  Paroxysmal A-fib: In sinus rhythm today. Continue metoprolol  tartrate, she takes 25 mg in AM, 50 mg in PM. Continue Eliquis  5 mg twice daily,    F/u in 6 months  Signed, Newman JINNY Lawrence, MD

## 2024-05-17 NOTE — Progress Notes (Signed)
 05/17/2024, 4:22 PM  Endocrinology follow-up note   Subjective:    Patient ID: Cindy Holmes, female    DOB: 25-Jun-1951, PCP Bevely Doffing, FNP   Past Medical History:  Diagnosis Date   CAD (coronary artery disease)    a. PTCA LAD 2001/cath 2002-prox and mid LAD stents patent, PTCA ostium Dx 1 b. cath 03/2016: patent LAD stent with less than 40% in-stent restenosis, 10-30% stenosis of D1 and distal LAD with continued medical management recommended. 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   Past Surgical History:  Procedure Laterality Date   BIOPSY  03/04/2023   Procedure: BIOPSY;  Surgeon: Rollin Dover, MD;  Location: [facility] ENDOSCOPY;  Service: Gastroenterology;;   BOWEL RESECTION  03/08/2023   Procedure: SMALL BOWEL RESECTION;  Surgeon: Vanderbilt Ned, MD;  Location: [facility] OR;  Service: General;;   CARDIAC CATHETERIZATION     with stent placement in 2001   CARDIAC CATHETERIZATION N/A 03/12/2016   Procedure: Left Heart Cath and Coronary Angiography;  Surgeon: Lonni Hanson, MD;  Location: [facility] INVASIVE CV LAB;  Service: Cardiovascular;  Laterality: N/A;   CARDIAC CATHETERIZATION N/A 03/12/2016   Procedure: Intravascular Pressure Wire/FFR Study;  Surgeon: Lonni Hanson, MD;  Location: [facility] INVASIVE CV LAB;  Service: Cardiovascular;  Laterality: N/A;   CATARACT EXTRACTION W/PHACO Right 01/24/2024   Procedure:  PHACOEMULSIFICATION, CATARACT, WITH IOL INSERTION;  Surgeon: Juli Blunt, MD;  Location: [facility] ORS;  Service: Ophthalmology;  Laterality: Right;  CDE: 5.63   CATARACT EXTRACTION W/PHACO Left 03/23/2024   Procedure: PHACOEMULSIFICATION, CATARACT, WITH IOL INSERTION;  Surgeon: Juli Blunt, MD;  Location: [facility] ORS;  Service: Ophthalmology;  Laterality: Left;  CDE: 6.81   CHOLECYSTECTOMY     COLECTOMY WITH COLOSTOMY CREATION/HARTMANN PROCEDURE N/A 03/08/2023   Procedure: COLECTOMY WITH COLOSTOMY CREATION/HARTMANN PROCEDURE;  Surgeon: Vanderbilt Ned, MD;  Location: [facility] OR;  Service: General;  Laterality: N/A;   COLONOSCOPY WITH PROPOFOL  N/A 03/04/2023   Procedure: COLONOSCOPY WITH PROPOFOL ;  Surgeon: Rollin Dover, MD;  Location: [facility] ENDOSCOPY;  Service: Gastroenterology;  Laterality: N/A;   CORONARY ANGIOPLASTY     2002   ESOPHAGOGASTRODUODENOSCOPY N/A 12/03/2016   Procedure: ESOPHAGOGASTRODUODENOSCOPY (EGD);  Surgeon: Rollin Dover, MD;  Location: [facility] ENDOSCOPY;  Service: Endoscopy;  Laterality: N/A;   INSERTION, STENT, DRUG-ELUTING, LACRIMAL CANALICULUS Right 01/24/2024   Procedure: INSERTION, STENT, DRUG-ELUTING, LACRIMAL CANALICULUS;  Surgeon: Juli Blunt, MD;  Location: [facility];  Service: Ophthalmology;  Laterality: Right;   INSERTION, STENT, DRUG-ELUTING, LACRIMAL CANALICULUS Left 03/23/2024   Procedure: INSERTION, STENT, DRUG-ELUTING, LACRIMAL CANALICULUS;  Surgeon: Juli Blunt, MD;  Location: [facility];  Service: Ophthalmology;  Laterality: Left;   LAPAROTOMY N/A 03/08/2023   Procedure: EXPLORATION LAPAROTOMY;  Surgeon: Vanderbilt Ned, MD;  Location: [facility];  Service: General;  Laterality: N/A;   laproscopic adjustable gastric  banding     with APS standard system.    OPEN REDUCTION INTERNAL FIXATION (ORIF) DISTAL RADIAL FRACTURE Right 05/15/2018   Procedure: OPEN REDUCTION INTERNAL FIXATION (ORIF) DISTAL RADIAL FRACTURE;  Surgeon: Kendal Franky SQUIBB, MD;  Location: [facility] OR;  Service:  Orthopedics;  Laterality: Right;   ORIF ANKLE FRACTURE Right 05/15/2018   Procedure: OPEN REDUCTION INTERNAL FIXATION (ORIF) ANKLE FRACTURE;  Surgeon: Kendal Franky SQUIBB, MD;  Location: [facility];  Service: Orthopedics;  Laterality: Right;   ORIF TIBIA PLATEAU Right 05/15/2018   Procedure: OPEN REDUCTION INTERNAL FIXATION (ORIF) TIBIAL PLATEAU;  Surgeon: Kendal Franky SQUIBB, MD;  Location: [facility];  Service: Orthopedics;  Laterality: Right;   Pt has 3 stents     sept 2001   SHOULDER ARTHROSCOPY WITH SUBACROMIAL DECOMPRESSION, ROTATOR CUFF REPAIR AND BICEP TENDON REPAIR Right 11/16/2013   Procedure: RIGHT SHOULDER ARTHROSCOPY, EXTENSIVE DEBRIDEMENT, ROTATOR CUFF REPAIR ;  Surgeon: Fonda SQUIBB Olmsted, MD;  Location: [facility];  Service: Orthopedics;  Laterality: Right;   TUBAL LIGATION     Social History   Socioeconomic History   Marital status: Married    Spouse name: Not on file   Number of children: Not on file   Years of education: Not on file   Highest education level: Not on file  Occupational History   Not on file  Tobacco Use   Smoking status: Never   Smokeless tobacco: Never  Vaping Use   Vaping status: Never Used  Substance and Sexual Activity   Alcohol  use: No    Alcohol /week: 0.0 standard drinks of alcohol    Drug use: No   Sexual activity: Never  Other Topics Concern   Not on file  Social History Narrative   Not on file   Social Drivers of Health   Financial Resource Strain: Low Risk  (05/26/2023)   Overall Financial Resource Strain (CARDIA)    Difficulty of Paying Living Expenses: Not very hard  Food Insecurity: No Food Insecurity (04/21/2023)   Hunger Vital Sign    Worried About Running Out of Food in the Last Year: Never true    Ran Out of Food in the Last Year: Never true  Transportation Needs: No Transportation Needs (05/26/2023)   PRAPARE - Administrator, Civil Service (Medical): No    Lack of Transportation (Non-Medical): No  Physical  Activity: Inactive (04/25/2023)   Exercise Vital Sign    Days of Exercise per Week: 0 days    Minutes of Exercise per Session: 0 min  Stress: Not on file  Social Connections: Not on file   Family History  Problem Relation Age of Onset   Heart attack Mother    Heart disease Mother    Hyperlipidemia Mother    Arthritis/Rheumatoid Father    Cirrhosis Father        hepatic cirrhosis   Osteoporosis Father    Breast cancer Sister 47   Diabetes Sister    Diabetes Brother    Heart disease Brother    Heart disease Brother    Heart disease Maternal Aunt    Heart attack Maternal Aunt    Throat cancer Maternal Aunt    Heart attack Maternal Uncle    Colon cancer Maternal Grandmother    Throat cancer Maternal Grandfather  Cancer Other        family history   Heart attack Other        family history   Outpatient Encounter Medications as of 05/17/2024  Medication Sig   Semaglutide ,0.25 or 0.5MG /DOS, (OZEMPIC , 0.25 OR 0.5 MG/DOSE,) 2 MG/3ML SOPN Inject 0.25 mg into the skin once a week.   allopurinol  (ZYLOPRIM ) 100 MG tablet Take 1 tablet (100 mg total) by mouth daily. Resume this at 100 mg daily follow with your PCP to titrate this back up as needed.   apixaban  (ELIQUIS ) 5 MG TABS tablet Take 1 tablet (5 mg total) by mouth 2 (two) times daily.   aspirin  EC 81 MG tablet Take 81 mg by mouth at bedtime. Swallow whole. (Patient not taking: Reported on 05/17/2024)   atorvastatin  (LIPITOR ) 40 MG tablet Take 2 tablets (80 mg total) by mouth daily   B Complex Vitamins (B COMPLEX VITAMIN PO) Take 1,000 mcg by mouth daily.   Cholecalciferol (VITAMIN D -3 PO) Take 50 mcg by mouth daily.   clobetasol cream (TEMOVATE) 0.05 % Apply 1 Application topically 2 (two) times daily.   Coenzyme Q10 (COQ10) 100 MG CAPS Take 1 capsule by mouth daily.   esomeprazole  (NEXIUM ) 20 MG capsule Take 20 mg by mouth daily at 12 noon.   ezetimibe  (ZETIA ) 10 MG tablet Take 1 tablet (10 mg total) by mouth daily.   folic  acid (FOLVITE ) 1 MG tablet Take 1 mg by mouth at bedtime.   furosemide  (LASIX ) 40 MG tablet Take 1 tablet (40 mg total) by mouth 2 (two) times daily.   hydrocortisone  (CORTEF ) 10 MG tablet Take 15 mg (1.5 tablet) daily at 8 AM and 10 mg (1 tablet) daily at noon   hydroxychloroquine  (PLAQUENIL ) 200 MG tablet Take 200 mg by mouth 2 (two) times daily.   magnesium  oxide (MAG-OX) 400 MG tablet Take 400 mg by mouth daily.   methotrexate  (RHEUMATREX) 2.5 MG tablet Take 10 mg by mouth every Sunday.   metoprolol  tartrate (LOPRESSOR ) 25 MG tablet Take 1 tablet (25 mg total) by mouth every morning. & 50 mg in the evening   metoprolol  tartrate (LOPRESSOR ) 50 MG tablet Take 50 mg by mouth daily. (Patient not taking: Reported on 05/17/2024)   Misc. Devices (ADAPTER CAP) MISC 1 Application by Does not apply route in the morning and at bedtime.   Omega 3-6-9 Fatty Acids (OMEGA 3-6-9 PO) Take 1 capsule by mouth daily.   Omega-3 Fatty Acids (FISH OIL) 1200 MG CAPS Take 1 capsule by mouth daily.   Ostomy Supplies (ADAPT CERARING) MISC 1 Ring by Does not apply route in the morning and at bedtime.   Ostomy Supplies (PREMIER CERAPLUS 2 1/8) Pouch MISC 1 Bag by Does not apply route in the morning and at bedtime.   potassium chloride  SA (KLOR-CON  M) 20 MEQ tablet Take 1 tablet (20 mEq total) by mouth 2 (two) times daily. (Patient taking differently: Take 40 mEq by mouth 2 (two) times daily. TAKE 2 TABLETS ( 40 MG) TWICE A DAY)   spironolactone  (ALDACTONE ) 25 MG tablet TAKE 1 TABLET BY MOUTH DAILY (Patient taking differently: Take 25 mg by mouth as needed.)   traMADol  (ULTRAM ) 50 MG tablet Take 1-2 tablets (50-100 mg total) by mouth every 6 (six) hours as needed.   zolpidem  (AMBIEN ) 5 MG tablet Take 1 tablet (5 mg total) by mouth at bedtime.   [DISCONTINUED] acetaminophen  (TYLENOL ) 500 MG tablet Take 1,000 mg by mouth every 6 (six) hours as  needed for headache (pain). (Patient not taking: Reported on 02/09/2024)    [DISCONTINUED] apixaban  (ELIQUIS ) 5 MG TABS tablet Take 1 tablet (5 mg total) by mouth 2 (two) times daily.   [DISCONTINUED] ferrous sulfate  325 (65 FE) MG tablet Take 1 tablet (325 mg total) by mouth every other day. Follow with your PCP to follow your iron deficiency outpatient. (Patient not taking: Reported on 02/09/2024)   [DISCONTINUED] furosemide  (LASIX ) 40 MG tablet Take 1 tablet (40 mg total) by mouth 2 (two) times daily.   [DISCONTINUED] Semaglutide -Weight Management (WEGOVY ) 0.5 MG/0.5ML SOAJ Inject 0.5 mg into the skin once a week.   [DISCONTINUED] Semaglutide -Weight Management (WEGOVY ) 1 MG/0.5ML SOAJ Inject 1 mg into the skin once a week. WHEN YOU FINISH THE 0.5 MG (Patient not taking: Reported on 05/17/2024)   [DISCONTINUED] torsemide  (DEMADEX ) 20 MG tablet Take 20 mg by mouth 2 (two) times daily.   [DISCONTINUED] zolpidem  (AMBIEN ) 5 MG tablet Take 5 mg by mouth at bedtime.   No facility-administered encounter medications on file as of 05/17/2024.   ALLERGIES: Allergies  Allergen Reactions   Codeine Phosphate Shortness Of Breath   Morphine And Codeine Nausea And Vomiting   Amoxicillin Nausea And Vomiting   Penicillins Nausea And Vomiting    Has patient had a PCN reaction causing immediate rash, facial/tongue/throat swelling, SOB or lightheadedness with hypotension:YES Has patient had a PCN reaction causing severe rash involving mucus membranes or skin necrosis: NO Has patient had a PCN reaction that required hospitalization NO Has patient had a PCN reaction occurring within the last 10 years: NO If all of the above answers are NO, then may proceed with Cephalosporin use.   Methotrexate  Nausea Only   Lidocaine  Rash    Reaction to lidocaine  patch    VACCINATION STATUS: Immunization History  Administered Date(s) Administered   Fluad Trivalent(High Dose 65+) 05/10/2023   INFLUENZA, HIGH DOSE SEASONAL PF 05/01/2024   Influenza, Quadrivalent, Recombinant, Inj, Pf 05/26/2020    PNEUMOCOCCAL CONJUGATE-20 05/01/2024   Pneumococcal Conjugate-13 05/24/2017   Zoster Recombinant(Shingrix) 05/02/2018    HPI KYLEEANN CREMEANS is 73 y.o. female who presents today with a medical history as above. she is being seen in follow-up after she was seen in consultation for adrenal insufficiency requested by Bevely Doffing, FNP.   Patient is accompanied by her husband to clinic.    Patient with long and complicated medical history including use of steroids high-dose for various rheumatology conditions including rheumatoid arthritis, osteoarthritis, neuromuscular disorders. High exposure to steroids used for decades at a minimum. Over the years, she has gained significant amount of weight associated with deconditioning and large joint arthritis which led her to be wheelchair-bound.  She also has coronary artery disease, CVA, hypertension, hyperlipidemia, obesity, prediabetes. Due to disequilibrium, she underwent lab work in June 2025 which revealed hypocortisolemia that was observed a.m. and p.m. times. On 08/24/2023 her ACTH  was 6.3, p.m. cortisol was 1.7, a.m. cortisol was 2.9. After previously treated with intermittent courses of prednisone , she was put on hydrocortisone  15 mg p.o. daily at 8 AM and 10 mg p.o. daily at noon during her last visit. She did draw some clinical/symptomatic benefit.  She does not have acute complaints today. She was diagnosed with osteoporosis after documenting vertebral fractures per her history, and she elaborates that she is on ongoing treatment with Reclast  at any.  Infusion center for the last 2 years and waiting for her next treatment in March 2026.    She had history of  fracture in her ankle, knee, wrist in addition to vertebral fractures. Her medical history includes extensive surgical history including cardiac catheterization, coronary angioplasty, laparoscopic adjustable gastric banding, open reduction and internal fixation of ankle fractures,  shoulder arthroscopy with subacromial decompression, cholecystectomy.  She denies nausea, vomiting, diarrhea.  She is sedentary at baseline.  She does not follow any particular diet program at this time. For unclear reasons, she recently stopped her Ozempic .  Her point-of-care A1c today is 5.9%, increasing from 5.3%. Her medications include atorvastatin , ezetimibe ,  torsemide , metoprolol , methotrexate , hydroxychloroquine , hydrocortisone  10 mg p.o. daily. She denies adrenal injury, surgery, or ablation.  Review of Systems  Constitutional: +mildly fluctuating body weight , + fatigue, no subjective hyperthermia, no subjective hypothermia, + wheelchair-bound   Objective:       05/17/2024    1:48 PM 05/17/2024   11:07 AM 05/01/2024    9:57 AM  Vitals with BMI  Height 5' 3 5' 3 5' 3  Weight 216 lbs 210 lbs 210 lbs  BMI 38.27 37.21 37.21  Systolic 118 116 899  Diastolic 62 73 71  Pulse 72 63 74    BP 118/62   Pulse 72   Ht 5' 3 (1.6 m)   Wt 216 lb (98 kg)   BMI 38.26 kg/m   Wt Readings from Last 3 Encounters:  05/17/24 216 lb (98 kg)  05/17/24 210 lb (95.3 kg)  05/01/24 210 lb (95.3 kg)    Physical Exam  Constitutional:  Body mass index is 38.26 kg/m.,  not in acute distress, normal state of mind Eyes: PERRLA, EOMI, no exophthalmos ENT: moist mucous membranes, no gross thyromegaly, no gross cervical lymphadenopathy   CMP ( most recent) CMP     Component Value Date/Time   NA 135 05/01/2024 1124   K 3.0 (L) 05/01/2024 1124   CL 86 (L) 05/01/2024 1124   CO2 29 05/01/2024 1124   GLUCOSE 126 (H) 05/01/2024 1124   GLUCOSE 125 (H) 05/30/2023 1357   BUN 49 (H) 05/01/2024 1124   CREATININE 1.75 (H) 05/01/2024 1124   CREATININE 0.68 11/13/2015 1047   CALCIUM  9.7 05/01/2024 1124   PROT 6.8 05/01/2024 1124   ALBUMIN  4.1 05/01/2024 1124   AST 14 05/01/2024 1124   ALT 13 05/01/2024 1124   ALKPHOS 98 05/01/2024 1124   BILITOT 0.5 05/01/2024 1124   EGFR 31 (L) 05/01/2024  1124   GFRNONAA 52 (L) 05/30/2023 1357   Can you enter A1 she is awake and 5 point joint urgent  Diabetic Labs (most recent): Lab Results  Component Value Date   HGBA1C 5.9 05/17/2024   HGBA1C 5.3 03/07/2023   HGBA1C 6.0 (H) 08/25/2022     Lipid Panel ( most recent) Lipid Panel     Component Value Date/Time   CHOL 155 05/01/2024 1132   TRIG 117 05/01/2024 1132   HDL 82 05/01/2024 1132   CHOLHDL 1.9 05/01/2024 1132   CHOLHDL 2.3 03/12/2016 0252   VLDL 30 03/12/2016 0252   LDLCALC 53 05/01/2024 1132   LABVLDL 20 05/01/2024 1132      Lab Results  Component Value Date   TSH 3.080 05/01/2024   TSH 1.916 03/06/2023   TSH 1.234 10/26/2020   TSH 2.398 03/11/2016   FREET4 1.64 05/01/2024      Assessment & Plan:   1. Adrenal insufficiency  2. Prediabetes 3. Mixed hyperlipidemia 4. Vitamin D  deficiency 5. Osteoporosis, unspecified    - I have reviewed her available new and available records and  clinically evaluated the patient. 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   It may not be feasible to withdraw steroid treatment to do repeat testing.   Patient will continue to need physiologic replacement dose glucocorticoids.  I have advised her to continue  hydrocortisone  15 mg p.o. daily at 8 AM and 10 mg p.o. daily at noon. Steroids sick day rule  and medical alert were discussed with her.  The need for lifelong steroid supplement was discussed with her.    At this time she may not need mineralocorticoid replacement as she is still being treated for hypertension with multiple medications.  She also has hypokalemia. Her potassium was recently adjusted at 20 mEq p.o. twice daily after her labs showed potassium level of 3. -Complications of excessive steroid treatment were  discussed with the family.  She already has prediabetes with point-of-care A1c of 5.9% today, osteoporosis, obesity, prediabetes, hypertension, hyperlipidemia with cardiovascular complications. Her most recent lipid panel showed LDL of 53, considered good control along with regular cholesterol 167.  She is advised to continue her atorvastatin  40 mg p.o. nightly.    She came off of Ozempic  for unclear reasons.  I discussed with her that she will continue to benefit from this intervention and I refilled her Ozempic  0.25 milligrams subcutaneously weekly.  This medication will be advanced as she tolerates. She will benefit the most from weight loss.  - Her bone density studies are not available to review, patient gets these studies at her orthopedist office.  Patient states that she gets treated for osteoporosis at [facility] infusion center.  She is encouraged to keep her next appointment for IV Reclast  in March 2026.   She is encouraged to obtain DEXA scan 2 years after her last study.     Due to her history of bariatric surgery and possible GERD, she is not a candidate for oral bisphosphonates.  She has exceedingly high risk for cardiovascular disease not a candidate for romosozumab.   - she is advised to maintain close follow up with Bevely Doffing, FNP for primary care needs.   I spent  26  minutes in the care of the patient today including review of labs from Thyroid  Function, CMP, and other relevant labs ; imaging/biopsy records (current and previous including abstractions from other facilities); face-to-face time discussing  her lab results and symptoms, medications doses, her options of short and long term treatment based on the latest standards of care / guidelines;   and documenting the encounter.  Cindy Holmes  participated in the discussions, expressed understanding, and voiced agreement with the above plans.  All questions were answered to her satisfaction. she is encouraged  to contact clinic should she have any questions or concerns prior to her return visit.   Follow up plan: Return in about 6 months (around 11/14/2024) for F/U with Pre-visit Labs, A1c -NV.   Ranny Earl, MD [facility] [address] [place], [place] [address] Phone: [phone]  Fax: [phone]     05/17/2024, 4:22 PM  This note was partially dictated with voice recognition software. Similar sounding words can be transcribed inadequately or may not  be corrected upon review.

## 2024-05-18 ENCOUNTER — Ambulatory Visit: Payer: Self-pay | Admitting: Cardiology

## 2024-05-18 LAB — BASIC METABOLIC PANEL WITH GFR
BUN/Creatinine Ratio: 20 (ref 12–28)
BUN: 34 mg/dL — ABNORMAL HIGH (ref 8–27)
CO2: 27 mmol/L (ref 20–29)
Calcium: 9.3 mg/dL (ref 8.7–10.3)
Chloride: 91 mmol/L — ABNORMAL LOW (ref 96–106)
Creatinine, Ser: 1.67 mg/dL — ABNORMAL HIGH (ref 0.57–1.00)
Glucose: 104 mg/dL — ABNORMAL HIGH (ref 70–99)
Potassium: 3.5 mmol/L (ref 3.5–5.2)
Sodium: 135 mmol/L (ref 134–144)
eGFR: 32 mL/min/1.73 — ABNORMAL LOW (ref >59)

## 2024-05-18 NOTE — Progress Notes (Signed)
 Creatinine remains elevated at 1.67.  This is not much better than what it was 2 weeks ago, but still  significantly higher than her baseline.  Please send results to PCP, if thye feel its necessary, they could place nephrology referral.  No change in medication from my side at this time.  Thanks MJP

## 2024-05-22 ENCOUNTER — Other Ambulatory Visit: Payer: Self-pay

## 2024-05-22 ENCOUNTER — Telehealth: Payer: Self-pay

## 2024-05-22 NOTE — Telephone Encounter (Signed)
 Pt's Rx Ozempic  0.25mg  weekly requires a prior auth.

## 2024-05-22 NOTE — Telephone Encounter (Signed)
 Tried to call pt to clarify dose of potassium, was unable to speak to pt or leave a message. Will try to call pt again at a later time.

## 2024-05-22 NOTE — Telephone Encounter (Signed)
 Pharmacy Patient Advocate Encounter   Received notification from Pt Calls Messages that prior authorization for Ozempic  is required/requested.   Ozempic Brena is approved exclusively as an adjunct to diet and exercise to improve glycemic  control in adults with type 2 diabetes mellitus. A review of patient's medical chart reveals no  documented diagnosis of type 2 diabetes or an A1C indicative of diabetes. Therefore, they do not  currently meet the criteria for prior authorization of this medication.

## 2024-05-23 ENCOUNTER — Telehealth: Payer: Self-pay

## 2024-05-23 ENCOUNTER — Other Ambulatory Visit: Payer: Self-pay

## 2024-05-23 NOTE — Telephone Encounter (Signed)
 Called in Rx for Micro-K  10meq 2 tablets bid to [facility] per Dr.Nida's orders.

## 2024-05-25 ENCOUNTER — Other Ambulatory Visit: Payer: Self-pay | Admitting: "Endocrinology

## 2024-05-25 MED ORDER — WEGOVY 0.25 MG/0.5ML ~~LOC~~ SOAJ: 0.2500 mg | SUBCUTANEOUS | 0 refills | Status: DC

## 2024-05-25 NOTE — Telephone Encounter (Signed)
 Pt stated she had used Wegovy  previously had a difficult time getting refills but is willing to restart it. States she has not injected Wegovy  in 2 months.

## 2024-06-05 DIAGNOSIS — L281 Prurigo nodularis: Secondary | ICD-10-CM | POA: Diagnosis not present

## 2024-06-05 DIAGNOSIS — L309 Dermatitis, unspecified: Secondary | ICD-10-CM | POA: Diagnosis not present

## 2024-06-05 DIAGNOSIS — L820 Inflamed seborrheic keratosis: Secondary | ICD-10-CM | POA: Diagnosis not present

## 2024-06-05 DIAGNOSIS — I788 Other diseases of capillaries: Secondary | ICD-10-CM | POA: Diagnosis not present

## 2024-06-18 ENCOUNTER — Other Ambulatory Visit: Payer: Self-pay | Admitting: "Endocrinology

## 2024-07-17 ENCOUNTER — Other Ambulatory Visit: Payer: Self-pay

## 2024-07-17 DIAGNOSIS — I2510 Atherosclerotic heart disease of native coronary artery without angina pectoris: Secondary | ICD-10-CM

## 2024-07-17 DIAGNOSIS — M810 Age-related osteoporosis without current pathological fracture: Secondary | ICD-10-CM

## 2024-07-17 DIAGNOSIS — I5032 Chronic diastolic (congestive) heart failure: Secondary | ICD-10-CM

## 2024-07-17 DIAGNOSIS — E782 Mixed hyperlipidemia: Secondary | ICD-10-CM

## 2024-07-17 DIAGNOSIS — M059 Rheumatoid arthritis with rheumatoid factor, unspecified: Secondary | ICD-10-CM

## 2024-07-17 DIAGNOSIS — N179 Acute kidney failure, unspecified: Secondary | ICD-10-CM

## 2024-07-17 DIAGNOSIS — Z79899 Other long term (current) drug therapy: Secondary | ICD-10-CM

## 2024-07-17 DIAGNOSIS — D6869 Other thrombophilia: Secondary | ICD-10-CM

## 2024-07-17 DIAGNOSIS — E669 Obesity, unspecified: Secondary | ICD-10-CM

## 2024-07-17 NOTE — Telephone Encounter (Unsigned)
 Copied from CRM [id number]. Topic: Clinical - Medication Refill >> Jul 17, 2024  4:29 PM Kevelyn M wrote: Medication: furosemide  (LASIX ) 40 MG tablet (90 day)  Has the patient contacted their pharmacy? No, needs to switch pharmacy's (Agent: If no, request that the patient contact the pharmacy for the refill. If patient does not wish to contact the pharmacy document the reason why and proceed with request.) (Agent: If yes, when and what did the pharmacy advise?)  This is the patient's preferred pharmacy:  [facility] [place] [place] - [address] [place] [address] Phone: [phone] Fax: [phone]  Is this the correct pharmacy for this prescription? Yes If no, delete pharmacy and type the correct one.   Has the prescription been filled recently? No  Is the patient out of the medication? No  Has the patient been seen for an appointment in the last year OR does the patient have an upcoming appointment? Yes  Can we respond through MyChart? No  Agent: Please be advised that Rx refills may take up to 3 business days. We ask that you follow-up with your pharmacy.

## 2024-07-18 ENCOUNTER — Telehealth: Payer: Self-pay

## 2024-07-18 MED ORDER — FUROSEMIDE 40 MG PO TABS: 40.0000 mg | ORAL_TABLET | Freq: Two times a day (BID) | ORAL | 0 refills | Status: DC

## 2024-07-18 NOTE — Telephone Encounter (Signed)
 Copied from CRM [id number]. Topic: Clinical - Medical Advice >> Jul 17, 2024  4:38 PM Kevelyn M wrote: Reason for CRM: Patient is inquiring about 03/04 appointment. She thought she was getting another injection not the Reclast . Please advise.  Call back # [phone]

## 2024-07-20 ENCOUNTER — Other Ambulatory Visit: Payer: Self-pay

## 2024-07-22 ENCOUNTER — Other Ambulatory Visit: Payer: Self-pay | Admitting: "Endocrinology

## 2024-07-22 ENCOUNTER — Other Ambulatory Visit: Payer: Self-pay

## 2024-07-22 DIAGNOSIS — F5101 Primary insomnia: Secondary | ICD-10-CM

## 2024-07-23 ENCOUNTER — Telehealth: Payer: Self-pay | Admitting: Pharmacy Technician

## 2024-07-23 ENCOUNTER — Other Ambulatory Visit (HOSPITAL_COMMUNITY): Payer: Self-pay

## 2024-07-23 ENCOUNTER — Other Ambulatory Visit: Payer: Self-pay

## 2024-07-23 ENCOUNTER — Telehealth: Payer: Self-pay | Admitting: "Endocrinology

## 2024-07-23 ENCOUNTER — Telehealth: Payer: Self-pay

## 2024-07-23 NOTE — Telephone Encounter (Signed)
 Pharmacy Patient Advocate Encounter   Received notification from Pt Calls Messages that prior authorization for Furosemide  40mg  & Zolpidem  5mg  is required/requested.   Insurance verification completed.   The patient is insured through [employer].   Per test claim: The current 90 day co-pay is, $29.38 foZolpidem .  No PA needed at this time. This test claim was processed through [facility]- copay amounts may vary at other pharmacies due to pharmacy/plan contracts, or as the patient moves through the different stages of their insurance plan.   Furosemide  was filled 07/18/24 for a 3 month supply, so she can't fill it again until 09/24/24. No PA should be needed. Was the prescription changed recently or anything?

## 2024-07-23 NOTE — Telephone Encounter (Signed)
 Looks like she filled her furosemide  07/18/24 for a 3 month supply, so it's too soon and shouldn't require a PA. Did the prescription recently change or something?  PA request has been Received. New Encounter has been or will be created for follow up. For additional info see Pharmacy Prior Auth telephone encounter from 07/23/24.

## 2024-07-23 NOTE — Telephone Encounter (Signed)
 PA request has been Submitted. New Encounter has been or will be created for follow up. For additional info see Pharmacy Prior Auth telephone encounter from 07/23/24.

## 2024-07-23 NOTE — Telephone Encounter (Signed)
 Pharmacy Patient Advocate Encounter   Received notification from Pt Calls Messages that prior authorization for Wegovy  0.25MG /0.5ML auto-injectors  is required/requested.   Insurance verification completed.   The patient is insured through [facility].   Per test claim: PA required; PA submitted to above mentioned insurance via Latent Key/confirmation #/EOC [id number] Status is pending

## 2024-07-23 NOTE — Telephone Encounter (Signed)
 Copied from CRM [id number]. Topic: Clinical - Prescription Issue >> Jul 23, 2024 11:08 AM Rosaria BRAVO wrote: Reason for CRM: Pt called to report that her insurance requires a prior authorization to fill her medications. Says this request was faxed.   Missing furosemide  (LASIX ) 40 MG tablet and zolpidem  (AMBIEN ) 5 MG tablet

## 2024-07-23 NOTE — Telephone Encounter (Signed)
 Constant ring , no answer

## 2024-07-23 NOTE — Telephone Encounter (Signed)
 Pt is stating PA is needed for Wegovy 

## 2024-07-25 ENCOUNTER — Other Ambulatory Visit (HOSPITAL_COMMUNITY): Payer: Self-pay

## 2024-07-25 ENCOUNTER — Telehealth: Payer: Self-pay

## 2024-07-25 NOTE — Telephone Encounter (Signed)
 Medication was filled 07/18/2024 for three month supply, do not know what pharmacy is needing

## 2024-07-25 NOTE — Telephone Encounter (Signed)
 Pharmacy Patient Advocate Encounter  Received notification from [employer] that Prior Authorization for Wegovy  0.25MG /0.5ML auto-injectors  has been APPROVED from 07/23/24 to 07/11/25. Ran test claim, Copay is $594.53. This test claim was processed through [facility]- copay amounts may vary at other pharmacies due to pharmacy/plan contracts, or as the patient moves through the different stages of their insurance plan.   PA #/Case ID/Reference #: [id number]  **Patient has a deductible and coinsurance.**

## 2024-07-25 NOTE — Telephone Encounter (Signed)
 Copied from CRM [id number]. Topic: Clinical - Prescription Issue >> Jul 25, 2024 12:34 PM Delon DASEN wrote: Reason for CRM: furosemide  (LASIX ) 40 MG tablet- pharmacy states they need more information- will need new prescription

## 2024-07-28 ENCOUNTER — Other Ambulatory Visit: Payer: Self-pay

## 2024-07-30 ENCOUNTER — Other Ambulatory Visit: Payer: Self-pay

## 2024-07-30 ENCOUNTER — Telehealth: Payer: Self-pay

## 2024-07-30 DIAGNOSIS — M059 Rheumatoid arthritis with rheumatoid factor, unspecified: Secondary | ICD-10-CM

## 2024-07-30 DIAGNOSIS — M810 Age-related osteoporosis without current pathological fracture: Secondary | ICD-10-CM

## 2024-07-30 DIAGNOSIS — D6869 Other thrombophilia: Secondary | ICD-10-CM

## 2024-07-30 DIAGNOSIS — I2510 Atherosclerotic heart disease of native coronary artery without angina pectoris: Secondary | ICD-10-CM

## 2024-07-30 DIAGNOSIS — I5032 Chronic diastolic (congestive) heart failure: Secondary | ICD-10-CM

## 2024-07-30 DIAGNOSIS — E669 Obesity, unspecified: Secondary | ICD-10-CM

## 2024-07-30 DIAGNOSIS — E782 Mixed hyperlipidemia: Secondary | ICD-10-CM

## 2024-07-30 DIAGNOSIS — N179 Acute kidney failure, unspecified: Secondary | ICD-10-CM

## 2024-07-30 DIAGNOSIS — Z79899 Other long term (current) drug therapy: Secondary | ICD-10-CM

## 2024-07-30 MED ORDER — FUROSEMIDE 40 MG PO TABS: 40.0000 mg | ORAL_TABLET | Freq: Two times a day (BID) | ORAL | 1 refills | Status: AC

## 2024-07-30 NOTE — Telephone Encounter (Signed)
 Copied from CRM [id number]. Topic: Clinical - Prescription Issue >> Jul 25, 2024 12:34 PM Delon DASEN wrote: Reason for CRM: furosemide  (LASIX ) 40 MG tablet- pharmacy states they need more information- will need new prescription

## 2024-08-02 NOTE — Telephone Encounter (Signed)
 Pt aware and stated she had picked her Rx up from her pharmacy.

## 2024-08-03 ENCOUNTER — Other Ambulatory Visit: Payer: Self-pay

## 2024-08-03 DIAGNOSIS — I5032 Chronic diastolic (congestive) heart failure: Secondary | ICD-10-CM

## 2024-08-03 DIAGNOSIS — M059 Rheumatoid arthritis with rheumatoid factor, unspecified: Secondary | ICD-10-CM

## 2024-08-03 DIAGNOSIS — D6869 Other thrombophilia: Secondary | ICD-10-CM

## 2024-08-03 DIAGNOSIS — E782 Mixed hyperlipidemia: Secondary | ICD-10-CM

## 2024-08-03 DIAGNOSIS — N179 Acute kidney failure, unspecified: Secondary | ICD-10-CM

## 2024-08-03 DIAGNOSIS — M810 Age-related osteoporosis without current pathological fracture: Secondary | ICD-10-CM

## 2024-08-03 DIAGNOSIS — Z79899 Other long term (current) drug therapy: Secondary | ICD-10-CM

## 2024-08-03 DIAGNOSIS — I2510 Atherosclerotic heart disease of native coronary artery without angina pectoris: Secondary | ICD-10-CM

## 2024-08-03 DIAGNOSIS — E669 Obesity, unspecified: Secondary | ICD-10-CM

## 2024-09-12 ENCOUNTER — Ambulatory Visit

## 2024-10-24 ENCOUNTER — Ambulatory Visit: Payer: Self-pay

## 2024-11-15 ENCOUNTER — Ambulatory Visit: Admitting: "Endocrinology
